# Patient Record
Sex: Female | Born: 1955
Health system: Southern US, Community
[De-identification: ages and names within clinical notes are randomized; demographics above are authoritative.]

## PROBLEM LIST (undated history)

## (undated) DIAGNOSIS — M545 Low back pain, unspecified: Secondary | ICD-10-CM

## (undated) DIAGNOSIS — M797 Fibromyalgia: Secondary | ICD-10-CM

## (undated) DIAGNOSIS — I10 Essential (primary) hypertension: Secondary | ICD-10-CM

## (undated) DIAGNOSIS — F32A Depression, unspecified: Secondary | ICD-10-CM

## (undated) DIAGNOSIS — E039 Hypothyroidism, unspecified: Secondary | ICD-10-CM

## (undated) DIAGNOSIS — Z87442 Personal history of urinary calculi: Secondary | ICD-10-CM

## (undated) DIAGNOSIS — R51 Headache: Secondary | ICD-10-CM

## (undated) DIAGNOSIS — R87619 Unspecified abnormal cytological findings in specimens from cervix uteri: Secondary | ICD-10-CM

## (undated) DIAGNOSIS — G8929 Other chronic pain: Secondary | ICD-10-CM

## (undated) DIAGNOSIS — G4700 Insomnia, unspecified: Secondary | ICD-10-CM

## (undated) DIAGNOSIS — K219 Gastro-esophageal reflux disease without esophagitis: Secondary | ICD-10-CM

## (undated) DIAGNOSIS — F329 Major depressive disorder, single episode, unspecified: Secondary | ICD-10-CM

## (undated) DIAGNOSIS — F419 Anxiety disorder, unspecified: Secondary | ICD-10-CM

## (undated) DIAGNOSIS — M810 Age-related osteoporosis without current pathological fracture: Secondary | ICD-10-CM

## (undated) DIAGNOSIS — E7800 Pure hypercholesterolemia, unspecified: Secondary | ICD-10-CM

## (undated) DIAGNOSIS — J45909 Unspecified asthma, uncomplicated: Secondary | ICD-10-CM

## (undated) DIAGNOSIS — C539 Malignant neoplasm of cervix uteri, unspecified: Secondary | ICD-10-CM

## (undated) DIAGNOSIS — M5136 Other intervertebral disc degeneration, lumbar region: Secondary | ICD-10-CM

## (undated) DIAGNOSIS — L900 Lichen sclerosus et atrophicus: Secondary | ICD-10-CM

## (undated) DIAGNOSIS — Z950 Presence of cardiac pacemaker: Secondary | ICD-10-CM

## (undated) DIAGNOSIS — E079 Disorder of thyroid, unspecified: Secondary | ICD-10-CM

## (undated) DIAGNOSIS — R7303 Prediabetes: Secondary | ICD-10-CM

## (undated) DIAGNOSIS — I509 Heart failure, unspecified: Secondary | ICD-10-CM

## (undated) DIAGNOSIS — I2510 Atherosclerotic heart disease of native coronary artery without angina pectoris: Secondary | ICD-10-CM

## (undated) DIAGNOSIS — N879 Dysplasia of cervix uteri, unspecified: Secondary | ICD-10-CM

## (undated) DIAGNOSIS — E559 Vitamin D deficiency, unspecified: Secondary | ICD-10-CM

## (undated) DIAGNOSIS — Z8719 Personal history of other diseases of the digestive system: Secondary | ICD-10-CM

## (undated) DIAGNOSIS — I4891 Unspecified atrial fibrillation: Secondary | ICD-10-CM

## (undated) DIAGNOSIS — D649 Anemia, unspecified: Secondary | ICD-10-CM

## (undated) DIAGNOSIS — M51369 Other intervertebral disc degeneration, lumbar region without mention of lumbar back pain or lower extremity pain: Secondary | ICD-10-CM

## (undated) HISTORY — PX: HERNIA REPAIR: SHX51

## (undated) HISTORY — DX: Anxiety disorder, unspecified: F41.9

## (undated) HISTORY — DX: Age-related osteoporosis without current pathological fracture: M81.0

## (undated) HISTORY — DX: Lichen sclerosus et atrophicus: L90.0

## (undated) HISTORY — DX: Atherosclerotic heart disease of native coronary artery without angina pectoris: I25.10

## (undated) HISTORY — DX: Dysplasia of cervix uteri, unspecified: N87.9

## (undated) HISTORY — PX: TUBAL LIGATION: SHX77

## (undated) HISTORY — DX: Insomnia, unspecified: G47.00

## (undated) HISTORY — PX: GYNECOLOGIC CRYOSURGERY: SHX857

## (undated) HISTORY — DX: Unspecified abnormal cytological findings in specimens from cervix uteri: R87.619

## (undated) HISTORY — DX: Other intervertebral disc degeneration, lumbar region: M51.36

## (undated) HISTORY — DX: Vitamin D deficiency, unspecified: E55.9

## (undated) HISTORY — PX: KNEE ARTHROSCOPY: SUR90

## (undated) HISTORY — DX: Other intervertebral disc degeneration, lumbar region without mention of lumbar back pain or lower extremity pain: M51.369

## (undated) HISTORY — PX: CARDIAC CATHETERIZATION: SHX172

## (undated) HISTORY — DX: Major depressive disorder, single episode, unspecified: F32.9

## (undated) HISTORY — DX: Depression, unspecified: F32.A

## (undated) NOTE — *Deleted (*Deleted)
PCP: Wanda Plump, MD  HF Cardiology: Dr. Shirlee Latch  63 y.o. with CAD s/p PCI in 2010, chronic combined systolic and diastolic HF (EF 16-10% in 2018), atrial fibrillation on chronic anticoagulation w/ Xarelto, HTN, and hypothyroidism was diagnosed w/ COVID-19 in Sep 2020 and treated at [facility]. COVID infection c/b PNA. She was discharged and readmitted back to [facility] 10/9-10/13/20 for gastroenteritis, felt to be a sequela of COVID-19. She was managed w/ supportive care and discharged home. Had f/u with PCP 10/22 and endorsed worsening LLQ pain leading to abdominal CT which showed acute diverticulitis. She was started on outpatient antibiotics, cipro + flagyl but symptoms failed to improve, prompting her to seek emergency medical care at [facility].  Initial EKG in the ED showed afib w/ RVR for which general cardiology was consulted. She was started on IV dilt for rate control but later required treatment w/ IV amiodarone due to persistent rapid ventricular response. Hospital course c/b volume overload, requiring IV diuretics. 2D echo was obtained and showed worsening LVEF, now 25-30% w/ moderate MR. She underwent subsequent R/LHC in 11/20 which demonstrated widely patent coronaries. RHC showed elevated mean RA pressure at 22, PWP 43, LVEDP 45 and low CO. CI by Fick 1.6. She underwent TEE-guided DCCV, TEE showed EF 15% with moderate RV dysfunction and moderate MR.  She went back into rapid atrial fibrillation.  She failed additional cardioversions.  Despite amiodarone gtt, HR remained in the 140s-150s.  Finally, she underwent AV nodal ablation with BiV pacing due to inability to control atrial fibrillation. While in the hospital, she was on milrinone gtt for a period of time, but we were able to taper it off. She was discharged home after medication optimization.   Echo in 2/21 showed EF up to 40-45%, mild diffuse hypokinesis, RV low normal function.   Today she returns for HF follow up. Last visit  farxiga was started. Since that appointment, fluid index was still suggestive of fluid overload so last week torsemide was increased to 40 mg daily. Overall feeling fine. Denies SOB/PND/Orthopnea. Appetite ok. No fever or chills. Weight at home  pounds. Taking all medications   Labs (11/20): digoxin level 1.4, creatinine 1.81, K 4.8, hgb 13.9 Labs (1/21): LDL 91, K 3.7, creatinine 9.60 Labs (2/21): K 5.1, creatinine 1.37, TSH normal, digoxin 0.4 Labs (5/21): LDL 68, digoxin 0.3 Labs (4/54): K 4.4, creatinine 1.21  Medtronic device interrogation: PMH: 1. Hypothyroidism 2. Single kidney s/p nephrectomy 3. Atrial fibrillation: Initially diagnosed in 2018.  She was on sotalol at one point to maintain NSR.  - Uncontrollable atrial fibrillation during 11/20 admission, patient eventually had AV nodal ablation with CRT-P implantation.  4. H/o achalasia 5. GERD 6. Fibromyalgia 7. H/o diverticulitis in 10/20 8. COVID-19 in 9/20.  9. H/o cervical cancer: cryotherapy.  10. Degenerative disc disease.  11. Chronic systolic CHF: Nonischemic cardiomyopathy, possibly tachycardia-mediated in setting of uncontrolled atrial fibrillation. Medtronic CRT-P device.  - Echo (2018): EF 45-50%.  - Echo (11/20): EF 25-30%, mildly decreased RV systolic function, moderate MR.  - TEE (11/20): EF 15%, moderately decreased RV systolic function, moderate MR.  - Cardiac MRI (11/20): EF 34%, RV EF 30%, no LGE (done after AV nodal ablation and BiV pacing).  - RHC (11/20): mean RA 22, mean PCWP 43, LVEDP 45, CI 1.6 (Fick) and 2.7 (thermodilution).  - Echo (2/21): EF 40-45%, mild diffuse hypokinesis, low normal RV systolic function.  12. CAD: PCI in 2010.  - LHC (11/20): No  significant coronary disease.   Social History   Socioeconomic History  . Marital status: Married    Spouse name: Not on file  . Number of children: 3  . Years of education: Not on file  . Highest education level: Not on file  Occupational  History  . 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 Smoking status: Former Smoker    Packs/day: 2.00    Years: 28.00    Pack years: 56.00    Types: Cigarettes    Quit date: 2000    Years since quitting: 21.8  . Smokeless tobacco: Never Used  Vaping Use  . Vaping Use: Never used  Substance and Sexual Activity  . Alcohol use: Yes    Alcohol/week: 5.0 standard drinks    Types: 5 Cans of beer per week    Comment: 07/27/2017 "3-4 drinks//month"  . Drug use: No  . Sexual activity: Not Currently    Comment: 1ST INTERCOURSE- 16, PARTNERS - 2  Other Topics Concern  . Not on file  Social History Narrative   Original from [place]   3 children, 2 alive   Household --pt and husband    Social Determinants of Corporate investment banker Strain:   . Difficulty of Paying Living Expenses: Not on file  Food Insecurity:   . Worried About Programme researcher, broadcasting/film/video in the Last Year: Not on file  . Ran Out of Food in the Last Year: Not on file  Transportation Needs:   . Lack of Transportation (Medical): Not on file  . Lack of Transportation (Non-Medical): Not on file  Physical Activity:   . Days of Exercise per Week: Not on file  . Minutes of Exercise per Session: Not on file  Stress:   . Feeling of Stress : Not on file  Social Connections:   . Frequency of Communication with Friends and Family: Not on file  . Frequency of Social Gatherings with Friends and Family: Not on file  . Attends Religious Services: Not on file  . Active Member of Clubs or Organizations: Not on file  . Attends Banker Meetings: Not on file  . Marital Status: Not on file  Intimate Partner Violence:   . Fear of Current or Ex-Partner: Not on file  . Emotionally Abused: Not on file  . Physically Abused: Not on file  . Sexually Abused: Not on file   Family History  Problem Relation Age of Onset  . Breast cancer Other        aunt   . CAD Brother   . Lung cancer Mother        F and M  . Diabetes  Father        F and mother   . CAD Father   . Lung cancer Father   . Stroke Sister   . Colon cancer Neg Hx    ROS: All systems reviewed and negative except as per HPI.   Current Outpatient Medications  Medication Sig Dispense Refill  . alendronate (FOSAMAX) 70 MG tablet Take 1 tablet (70 mg total) by mouth every 7 (seven) days. Take with a full glass of water on an empty stomach. 12 tablet 3  . allopurinol (ZYLOPRIM) 100 MG tablet TAKE ONE TABLET BY MOUTH DAILY 90 tablet 0  . atorvastatin (LIPITOR) 40 MG tablet TAKE 1 AND 1/2 TABLET BY MOUTH AT BEDTIME 135 tablet 0  . carvedilol (COREG) 6.25 MG tablet TAKE ONE TABLET BY MOUTH TWICE  A DAY WITH MEALS 180 tablet 3  . Cholecalciferol (VITAMIN D3) 5000 UNITS CAPS Take 5,000 Units by mouth daily.     . clobetasol cream (TEMOVATE) 0.05 %     . cyclobenzaprine (FLEXERIL) 10 MG tablet Take 1 tablet (10 mg total) by mouth 2 (two) times daily as needed for muscle spasms. 60 tablet 1  . dapagliflozin propanediol (FARXIGA) 10 MG TABS tablet Take 1 tablet (10 mg total) by mouth daily before breakfast. 30 tablet 3  . DULoxetine (CYMBALTA) 60 MG capsule TAKE ONE CAPSULE BY MOUTH DAILY 90 capsule 0  . ferrous fumarate (HEMOCYTE - 106 MG FE) 325 (106 Fe) MG TABS tablet Take 1 tablet (106 mg of iron total) by mouth 2 (two) times daily. Do not take at the same time as omeprazole (Prilosec) 60 tablet 6  . ketoconazole (NIZORAL) 2 % cream Apply 1 application topically daily. 60 g 0  . levothyroxine (SYNTHROID) 125 MCG tablet Take 1 tablet (125 mcg total) by mouth daily before breakfast. 90 tablet 1  . metFORMIN (GLUCOPHAGE) 500 MG tablet Take 1 tablet (500 mg total) by mouth 2 (two) times daily with a meal. 180 tablet 1  . omeprazole (PRILOSEC) 20 MG capsule Take 1 capsule (20 mg total) by mouth 2 (two) times daily before a meal. 180 capsule 3  . rivaroxaban (XARELTO) 20 MG TABS tablet Take 1 tablet (20 mg total) by mouth daily with supper. 90 tablet 3  .  sacubitril-valsartan (ENTRESTO) 97-103 MG Take 1 tablet by mouth 2 (two) times daily. 60 tablet 3  . spironolactone (ALDACTONE) 25 MG tablet Take 1 tablet (25 mg total) by mouth daily. 30 tablet 11  . torsemide (DEMADEX) 20 MG tablet Take 2 tablets (40 mg total) by mouth daily. 180 tablet 3  . zolpidem (AMBIEN) 5 MG tablet TAKE ONE TABLET BY MOUTH AT BEDTIME AS NEEDED 90 tablet 0   No current facility-administered medications for this visit.   There were no vitals taken for this visit.  Wt Readings from Last 3 Encounters:  06/18/20 99.4 kg (219 lb 2 oz)  06/04/20 103.1 kg (227 lb 3.2 oz)  04/10/20 101.5 kg (223 lb 12.8 oz)   General:  Well appearing. No resp difficulty HEENT: normal Neck: supple. no JVD. Carotids 2+ bilat; no bruits. No lymphadenopathy or thryomegaly appreciated. Cor: PMI nondisplaced. Regular rate & rhythm. No rubs, gallops or murmurs. Lungs: clear Abdomen: soft, nontender, nondistended. No hepatosplenomegaly. No bruits or masses. Good bowel sounds. Extremities: no cyanosis, clubbing, rash, edema Neuro: alert & orientedx3, cranial nerves grossly intact. moves all 4 extremities w/o difficulty. Affect pleasant   Assessment/Plan: 1. Chronic systolic CHF: Nonischemic cardiomyopathy. Echo in 2018 with EF 45-50%. Echo in 11/20 with EF 25-30%, diffuse hypokinesis, mildly decreased RV systolic function, moderate MR. LHC/RHC in 11/20 with no significant coronary disease, markedly elevated filling pressures and low cardiac output (CI 1.6). 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 Cardiac MRI 11/20 showed LVEF 34%, RVEF 30%, no LGE => perhaps pointing to tachy-mediated CMP as more likely etiology.  Initially required inotropic support w/ milrinone but able to titrate off.  Now s/p AV nodal ablation with MDT CRT-P.  Echo in 2/21 showed EF up to 40-45%. NYHA II. -  Volume status  - Continue torsemide 40 mg daily.  - Continue farxiga 10 mg daily.  -Continue Entresto to 97/103 bid. - Continue  spironolactone to 25 daily.  -  Continue Coreg 6.25 mg bid.  -EF 40-45% - stop digoxin.   2. Atrial fibrillation: Persistent atrial fibrillation, it appears, at least since 9/20 when she was admitted with COVID-19. Prior, she had paroxysmal atrial fibrillation and was maintained on sotalol. She is now off sotalol.  Unable to cardiovert or rate control atrial fibrillation. Therefore, she had AV nodal ablation with BiV pacing.  - Now off amiodarone.  Rate controlled. No bleeding issues.  - Continue Xarelto 20 mg daily.  3. Mitral regurgitation: Functional MR, moderate on TEE.  Rate was controlled on cardiac MRI, MR was less impressive.  Minimal MR on echo in 2/21.  4. CKD stage 3: Check BMET and in 7 days.  5. Suspect OSA: Arrange sleep study.     Check bmet.   Followup    Amy Clegg NP-C  06/28/2020

---

## 1989-08-15 HISTORY — PX: NEPHRECTOMY: SHX65

## 2005-07-28 ENCOUNTER — Ambulatory Visit: Payer: Self-pay | Admitting: Pulmonary Disease

## 2005-07-29 ENCOUNTER — Ambulatory Visit (HOSPITAL_BASED_OUTPATIENT_CLINIC_OR_DEPARTMENT_OTHER): Admission: RE | Admit: 2005-07-29 | Discharge: 2005-07-29 | Payer: Self-pay | Admitting: Pulmonary Disease

## 2005-08-11 ENCOUNTER — Ambulatory Visit: Payer: Self-pay | Admitting: Pulmonary Disease

## 2005-08-12 ENCOUNTER — Ambulatory Visit: Payer: Self-pay | Admitting: Pulmonary Disease

## 2005-08-18 ENCOUNTER — Ambulatory Visit: Payer: Self-pay | Admitting: Internal Medicine

## 2007-08-16 HISTORY — PX: ESOPHAGOMYOTOMY: SHX5240

## 2007-11-29 ENCOUNTER — Encounter: Admission: RE | Admit: 2007-11-29 | Discharge: 2007-11-29 | Payer: Self-pay | Admitting: Gastroenterology

## 2007-12-10 ENCOUNTER — Ambulatory Visit (HOSPITAL_COMMUNITY): Admission: RE | Admit: 2007-12-10 | Discharge: 2007-12-10 | Payer: Self-pay | Admitting: Gastroenterology

## 2008-02-20 ENCOUNTER — Inpatient Hospital Stay (HOSPITAL_COMMUNITY): Admission: AD | Admit: 2008-02-20 | Discharge: 2008-02-22 | Payer: Self-pay | Admitting: Surgery

## 2008-09-09 ENCOUNTER — Encounter: Admission: RE | Admit: 2008-09-09 | Discharge: 2008-09-09 | Payer: Self-pay | Admitting: Rheumatology

## 2008-09-11 ENCOUNTER — Ambulatory Visit: Payer: Self-pay | Admitting: Internal Medicine

## 2008-09-22 ENCOUNTER — Encounter: Admission: RE | Admit: 2008-09-22 | Discharge: 2008-09-22 | Payer: Self-pay | Admitting: Rheumatology

## 2008-09-30 ENCOUNTER — Ambulatory Visit (HOSPITAL_COMMUNITY): Admission: RE | Admit: 2008-09-30 | Discharge: 2008-09-30 | Payer: Self-pay | Admitting: Rheumatology

## 2008-09-30 LAB — CBC WITH DIFFERENTIAL/PLATELET
Eosinophils Absolute: 0 10*3/uL (ref 0.0–0.5)
LYMPH%: 9.3 % — ABNORMAL LOW (ref 14.0–48.0)
MCHC: 33.6 g/dL (ref 32.0–36.0)
MCV: 87.3 fL (ref 81.0–101.0)
MONO%: 2.4 % (ref 0.0–13.0)
NEUT#: 9.9 10*3/uL — ABNORMAL HIGH (ref 1.5–6.5)
Platelets: 299 10*3/uL (ref 145–400)
RBC: 4.18 10*6/uL (ref 3.70–5.32)

## 2008-10-02 LAB — COMPREHENSIVE METABOLIC PANEL
Alkaline Phosphatase: 71 U/L (ref 39–117)
Creatinine, Ser: 0.56 mg/dL (ref 0.40–1.20)
Glucose, Bld: 97 mg/dL (ref 70–99)
Sodium: 140 mEq/L (ref 135–145)
Total Bilirubin: 0.3 mg/dL (ref 0.3–1.2)
Total Protein: 7.6 g/dL (ref 6.0–8.3)

## 2008-10-02 LAB — IMMUNOFIXATION ELECTROPHORESIS
IgA: 477 mg/dL — ABNORMAL HIGH (ref 68–378)
IgG (Immunoglobin G), Serum: 1040 mg/dL (ref 694–1618)
IgM, Serum: 55 mg/dL — ABNORMAL LOW (ref 60–263)
Total Protein, Serum Electrophoresis: 7.6 g/dL (ref 6.0–8.3)

## 2010-12-28 NOTE — Discharge Summary (Signed)
Melinda, Mckinney NO.:  [id number]   MEDICAL RECORD NO.:  [id number]          PATIENT TYPE:  INP   LOCATION:  1524                         FACILITY:  [facility]   PHYSICIAN:  Ardeth Sportsman, MD     DATE OF BIRTH:  Oct 15, 1955   DATE OF ADMISSION:  02/18/2008  DATE OF DISCHARGE:                               DISCHARGE SUMMARY   PRIMARY CARE PHYSICIAN:  Lerry Liner, M.D.   GASTROENTEROLOGIST:  Jordan Hawks. Elnoria Howard, MD   SURGEON:  Ardeth Sportsman, MD   FINAL DISCHARGE DIAGNOSIS:  Achalasia.   OTHER DISCHARGE DIAGNOSES INCLUDE:  1. Obesity.  2. Chronic constipation.  3. Cough.  4. Hyperlipidemia.  5. Hypothyroidism.  6. Osteoporosis.  7. Asthma.  8. Cervical dysplasia.  9. Migraine headaches.  10.Pyelonephritis and kidney infections in the past.  11.Status post nephrectomy.  12.Status post incisional hernia repairs.  13.History of Bell palsy and vision changes, resolved.  14.History of colon polyps, status post colonoscopy 2 years ago.  15.Diabetes.  16.Status post C-section x3.   PROCEDURE:  Laparoscopic Heller esophagocardiomyotomy with Toupet  partial posterior (240-degrees) fundoplication on February 18, 2008.   HOSPITAL COURSE:  Melinda Mckinney is a pleasant 55 year old obese female who  had worsening dysphagia with workup by radiographic and endoscopic  findings consistent with achalasia.  Surgical consultation was made and  I recommended myotomy and fundoplication.  The risks, benefits, and  alternatives were discussed, she agree to proceed.  She underwent this  on February 18, 2008.  Postoperatively she had significant dysphagia.  She  had very little contrast going across her stomach.  She was transitioned  back down to sips and placed on IV steroids.  The following day her  dysphagia markedly decreased.   On postop day #3 repeat swallow studies showed much improvement in  passage of contrast and decreased edema.  Her dysphagia was markedly  improved.  She was  advanced to a pureed diet.   By postoperative day #4 she was walking the hallways with the pain  controlled on oral medications.  No fever, chills, or sweats.  She had  had flatus and a bowel movement. The patient improved and was discharged  home with the following instructions:  1. She is to stick to a pureed diet for at least 2 weeks.  We had      discussed this at length preoperatively and postoperatively with an      interpreter and has a handout that discusses the type of a diet      advancement in detail.  She should use warm liquids for severe      dysphagia, and transition down to liquids only if her dysphagia      persists.  2. She should crush her home medications until she is tolerating a      soft diet.   DISCHARGE MEDICATIONS:  She should resume her home medications to  include  1. Amitriptyline 25 mg p.o. nightly.  2. Atenolol 25 mg p.o. daily.  3. Tramadol 50 mg 1-2 p.o. q.4 h. p.r.n. pain.  4. Levothyroxine  100 mcg daily.  5. Metformin 500 mg p.o. daily.  6. Vitamin B complex b.i.d.  7. Vitamin B 6 daily.  8. Glucosamine daily.  9. Calcium daily.  10.Advair p.r.n.  11.She should take tramadol, ibuprofen, and Tylenol as needed for pain      control.  12.She can use ice and heat as needed for pain control.  13.She should take Reglan 10 mg p.o. q.6 h. p.r.n. or Phenergan 25 mg      suppositories PR q.6 h. p.r.n. nausea or vomiting.  She has refills      for those if she needs that later on.   She should return to clinic to see me in about 2 weeks to make sure that  she is continuing to improve.      Ardeth Sportsman, MD  Electronically Signed     SCG/MEDQ  D:  02/22/2008  T:  02/22/2008  Job:  [id number]   cc:   Lerry Liner, MD   Jordan Hawks. Elnoria Howard, MD  Fax: [phone]

## 2010-12-28 NOTE — Op Note (Signed)
NAMEDARRIEL, SINQUEFIELD                ACCOUNT NO.:  [id number]   MEDICAL RECORD NO.:  [id number]          PATIENT TYPE:  AMB   LOCATION:  DAY                          FACILITY:  [facility]   PHYSICIAN:  Ardeth Sportsman, MD     DATE OF BIRTH:  03-21-1956   DATE OF PROCEDURE:  DATE OF DISCHARGE:                               OPERATIVE REPORT   PRIMARY CARE PHYSICIAN:  Lerry Liner.   GASTROENTEROLOGIST:  Jordan Hawks. Elnoria Howard, MD.   PREOPERATIVE DIAGNOSIS:  Achalasia.   POSTOPERATIVE DIAGNOSES:  1. Achalasia.  2. Fatty steatohepatosis.   SURGEON:  Ardeth Sportsman, MD   ASSISTANT:  Harden Mo, MD   PROCEDURE PERFORMED:  1. Laparoscopic Heller esophagocardiomyotomy.  2. Toupet (partial posterior 240-degree) fundoplication.   ANESTHESIA:  1. General anesthesia.  2. Local anesthetic in a field block around all port sites.   SPECIMENS:  None.   DRAINS:  None.   ESTIMATED BLOOD LOSS:  50 mL.   COMPLICATIONS:  No major complications.   INDICATIONS:  Ms. Nipper is a 55 year old Hispanic female who has had  progressive dysphagia to solids and then liquids.  She had evaluation 2  years ago which showed concerns of an esophageal stricture.  She was  dilated.  Unfortunately, she had recurrent dysphagia.  Dr. Elnoria Howard worked  her up and based on endoscopic, manometric and then radiographic  findings were strongly suggestive of achalasia.  Surgical consultation  was requested.   The anatomy and physiology of the digestive tract was explained and the  pathophysiology of achalasia was discussed.  Its natural history was  explained as well.  Options were discussed and recommendations made for  diagnostic laparoscopy with a myotomy of the hypertrophic esophageal and  stomach muscles.  Recommendations were made for fundoplication for  antireflux mechanism as well and a partial one to avoid recurrent  dysphagia.  Risks, benefits, alternatives discussed, questions answered,  and she agreed to  see.   OPERATIVE FINDINGS:  She had a normal crus with no evidence of any  paraesophageal hernia and no major upper abdominal adhesions.  She did  have fatty steatohepatitis.   DESCRIPTION OF PROCEDURE:  Informed consent was confirmed.  The patient  received IV clindamycin and gentamicin given her PENICILLIN allergy.  She had sequential compression devices active during the entire case.  She underwent general anesthesia without any difficulty.  She had a  Foley catheter sterilely placed.  She was positioned supine with both  arms in a split leg.  A 5-mm port was placed in the left upper quadrant  with the patient in steep reverse Trendelenburg and the left side up.  Camera inspection revealed no intra-abdominal injury.  Under direct  visualization a 5-mm port was placed in the right upper quadrant.  Another one was placed in the subxiphoid region going just to the left  of midline, the subxiphoid region removed and the Nathanson omega-shaped  liver retractor was used to lift the left lateral sector of the liver  anteriorly.  Another 10-mm port was placed in the left subcostal region.   A  window was made in the hepatogastric omentum and this was followed up  towards the right crus.  A window was made between the right crus and  the esophagus.  This helped define the medial border of the right crus.  This was followed posteriorly until the base of the crura was noted.  I  was able to find the left crus with this.  I was able to define the  lateral border of the left crus at its base and the retroesophageal  window and follow that a little more cephalad.   Further dissection was done over the left crus anteriorly over to the  anterior medial part of the left crus.  The short gastric vessels were  taken off the greater curvature of the stomach about a third of the way  down and followed up along the greater curve over the cardia.  The  stomach was rolled away from the greater omentum and  the spleen to avoid  any injury.  This helped meet up with the left crus.  A window was made  between the medial border of the left crus and the esophagus.  Further,  this helped identify both crura as well.   A type 2 mediastinal dissection was done to help get better intra-  abdominal esophageal length.  The esophagus did not seem particularly  dilated going more proximally in.  The anterior epiphrenic appendage was  freed off to help better define the esophagogastric junction.  The  anterior vagus nerve was mobilized from the lesser curvature of the  stomach proximally up to the mid esophagus to help reflect it out of the  way.   A myotomy was performed starting at the base of the esophagogastric  junction using a cold scissors initially to get through the longitudinal  fibers and through the circular fibers.  Careful blunt dissection was  done to get underneath the hypertrophic circular fibers and the myotomy  was performed, using primarily Harmonic to help ligate through the  hypertrophic muscles.  This myotomy was carried 7 cm more proximally on  the esophagus to good result.  Further dissection was done more  proximally on the stomach to help free the oblique and other muscle  layers off until we got a good myotomy.  There was a small amount of  bleeders with some crossing vessels, but these were controlled with  pressure as well as focused cautery.  The myotomy went over 3 cm onto  the stomach from the esophagogastric junction.  Total length was over 10  cm.   With a good myotomy we went ahead and brought the cardia of the stomach  posterior to the esophagus over the right side to help set up the right  side of the wrap, a shoeshine maneuver as well make sure the wrap was  not twisted or turned.  A posterior gastropexy was done by taking a bite  of the posterior wall of the right side of the wrap to the crura and  using interrupted stitches x3 using #1 Ethibond stitch.  This  helped to  have a posterior gastropexy.  The wrap was set up on the Toupet  fundoplication by taking a bite through the apex of the left crus as  well as the most superior end of the left side of the wrap at the  greater curvature and also taking a bite at the myotomy at the most  superior edge of intraabdominal esophagus as well.  A similar bite  was  made in a mirror-image fashion on the right.  Two more bites were taken  down more distally for a total of 6 interrupted stitches for a length of  4.5 cm for the classic posterior Toupet fundoplication.  The vagus was  not involved.  Hemostasis was good.  There was a small tear on the  posterior liver, which was controlled using cautery.  With hemostasis  good, irrigation was done and inspection was good.  The liver retractor  was removed under direct visualization.  Capnoperitoneum was evacuated  and ports were removed.  The 10-mm left subcostal ridge port site had  rolled back up above the ribs and so I did not more aggressively close  it.  The skin was closed with 4-0 Monocryl stitch.  She was taken to the  recovery room in stable condition.   I am about to explain the operative findings to the patient's family.  I  discussed postoperative care with the patient prior to surgery as well.      Ardeth Sportsman, MD  Electronically Signed     SCG/MEDQ  D:  02/18/2008  T:  02/18/2008  Job:  [id number]

## 2010-12-31 NOTE — Procedures (Signed)
NAMESENECA, GADBOIS NO.:  [id number]   MEDICAL RECORD NO.:  [id number]          PATIENT TYPE:  OUT   LOCATION:  SLEEP CENTER                 FACILITY:  [facility]   PHYSICIAN:  Marcelyn Bruins, M.D. [facility] DATE OF BIRTH:  [birth year]   DATE OF STUDY:  07/29/2005                              NOCTURNAL POLYSOMNOGRAM   REFERRING PHYSICIAN:  Dr. Danice Goltz   INDICATION FOR THE STUDY:  Persistent disorder of initiating and maintaining  sleep.   EPWORTH SCORE:  15   SLEEP ARCHITECTURE:  The patient had a total sleep time of 406 minutes with  decreased slow wave sleep and also REM. Sleep onset latency was fairly rapid  at 3 minutes; however, REM latency was very prolonged at 309 minutes. Sleep  efficiency was 95%.   RESPIRATORY DATA:  The patient was found to have 22 hypopneas and nine  apneas for a respiratory disturbance index of five events per hour. These  events occurred primarily during REM, and moderate to severe snoring was  noted.   OXYGEN DATA:  There was O2 desaturation as low as 87% associated with her  obstructive events.   CARDIAC DATA:  No clinically significant cardiac arrhythmias.   MOVEMENT/PARASOMNIAS:  The patient was found to have 110 leg jerks with  three per hour resulting in arousal or awakening. She was also found to have  increased chin EMG at times that may be indicative of swallowing/GERD.  Clinical correlation is suggested for both of these entities.   IMPRESSION/RECOMMENDATION:  1.  Very mild obstructive sleep apnea/hypopnea syndrome with a respiratory      disturbance index of five events per hour and oxygen desaturation as low      as 87%. However, the patient did have moderate to severe snoring and      very large numbers of nonspecific arousals which may be indicative of      the upper airway resistance syndrome as well.  2.  Large numbers of leg jerks with significant sleep disruption. It is      unclear whether this is due to the  patient's obstructive sleep apnea or      possibly a separate entity.  3.  Increased chin EMG which may be indicative of gastroesophageal reflux      disease. Clinical correlation is suggested.           ______________________________  Marcelyn Bruins, M.D. [facility]  Diplomate, American Board of Sleep  Medicine     KC/MEDQ  D:  08/11/2005 18:19:09  T:  08/12/2005 15:06:10  Job:  [id number]

## 2011-05-12 LAB — BASIC METABOLIC PANEL
BUN: 14
CO2: 27
Chloride: 109
Glucose, Bld: 95
Potassium: 3.9

## 2011-05-12 LAB — CREATININE, SERUM
Creatinine, Ser: 0.57
GFR calc Af Amer: >60
GFR calc non Af Amer: >60

## 2011-05-12 LAB — POTASSIUM: Potassium: 3.8

## 2011-05-12 LAB — HEMOGLOBIN A1C: Mean Plasma Glucose: 119

## 2011-08-18 ENCOUNTER — Emergency Department (HOSPITAL_BASED_OUTPATIENT_CLINIC_OR_DEPARTMENT_OTHER)
Admission: EM | Admit: 2011-08-18 | Discharge: 2011-08-18 | Disposition: A | Discharge status: Home / Self Care | Payer: Federal, State, Local not specified - PPO | Attending: Emergency Medicine | Admitting: Emergency Medicine

## 2011-08-18 ENCOUNTER — Encounter: Payer: Self-pay | Admitting: *Deleted

## 2011-08-18 ENCOUNTER — Emergency Department (INDEPENDENT_AMBULATORY_CARE_PROVIDER_SITE_OTHER): Payer: Federal, State, Local not specified - PPO

## 2011-08-18 DIAGNOSIS — K654 Sclerosing mesenteritis: Secondary | ICD-10-CM

## 2011-08-18 DIAGNOSIS — R935 Abnormal findings on diagnostic imaging of other abdominal regions, including retroperitoneum: Secondary | ICD-10-CM

## 2011-08-18 DIAGNOSIS — R509 Fever, unspecified: Secondary | ICD-10-CM

## 2011-08-18 DIAGNOSIS — I10 Essential (primary) hypertension: Secondary | ICD-10-CM | POA: Insufficient documentation

## 2011-08-18 DIAGNOSIS — R05 Cough: Secondary | ICD-10-CM

## 2011-08-18 DIAGNOSIS — K219 Gastro-esophageal reflux disease without esophagitis: Secondary | ICD-10-CM | POA: Insufficient documentation

## 2011-08-18 DIAGNOSIS — IMO0001 Reserved for inherently not codable concepts without codable children: Secondary | ICD-10-CM | POA: Insufficient documentation

## 2011-08-18 DIAGNOSIS — R109 Unspecified abdominal pain: Secondary | ICD-10-CM | POA: Insufficient documentation

## 2011-08-18 DIAGNOSIS — E079 Disorder of thyroid, unspecified: Secondary | ICD-10-CM | POA: Insufficient documentation

## 2011-08-18 DIAGNOSIS — E7800 Pure hypercholesterolemia, unspecified: Secondary | ICD-10-CM | POA: Insufficient documentation

## 2011-08-18 DIAGNOSIS — F341 Dysthymic disorder: Secondary | ICD-10-CM | POA: Insufficient documentation

## 2011-08-18 DIAGNOSIS — R059 Cough, unspecified: Secondary | ICD-10-CM

## 2011-08-18 DIAGNOSIS — R197 Diarrhea, unspecified: Secondary | ICD-10-CM | POA: Insufficient documentation

## 2011-08-18 HISTORY — DX: Fibromyalgia: M79.7

## 2011-08-18 HISTORY — DX: Gastro-esophageal reflux disease without esophagitis: K21.9

## 2011-08-18 HISTORY — DX: Essential (primary) hypertension: I10

## 2011-08-18 HISTORY — DX: Major depressive disorder, single episode, unspecified: F32.9

## 2011-08-18 HISTORY — DX: Depression, unspecified: F32.A

## 2011-08-18 HISTORY — DX: Pure hypercholesterolemia, unspecified: E78.00

## 2011-08-18 HISTORY — DX: Disorder of thyroid, unspecified: E07.9

## 2011-08-18 LAB — URINALYSIS, ROUTINE W REFLEX MICROSCOPIC
Nitrite: NEGATIVE
Specific Gravity, Urine: 1.016 (ref 1.005–1.030)
Urobilinogen, UA: 0.2 mg/dL (ref 0.0–1.0)

## 2011-08-18 LAB — CBC
MCH: 28.5 pg (ref 26.0–34.0)
MCV: 88.2 fL (ref 78.0–100.0)
Platelets: 263 10*3/uL (ref 150–400)
RBC: 4.07 MIL/uL (ref 3.87–5.11)

## 2011-08-18 LAB — COMPREHENSIVE METABOLIC PANEL
BUN: 15 mg/dL (ref 6–23)
Calcium: 9.6 mg/dL (ref 8.4–10.5)
Creatinine, Ser: 0.6 mg/dL (ref 0.50–1.10)
GFR calc Af Amer: >90 mL/min (ref >90)
Glucose, Bld: 86 mg/dL (ref 70–99)
Sodium: 141 mEq/L (ref 135–145)
Total Protein: 6.9 g/dL (ref 6.0–8.3)

## 2011-08-18 LAB — DIFFERENTIAL
Eosinophils Absolute: 0.3 10*3/uL (ref 0.0–0.7)
Eosinophils Relative: 4 % (ref 0–5)
Lymphs Abs: 2.5 10*3/uL (ref 0.7–4.0)
Monocytes Relative: 8 % (ref 3–12)

## 2011-08-18 LAB — LIPASE, BLOOD: Lipase: 30 U/L (ref 11–59)

## 2011-08-18 MED ORDER — IOHEXOL 300 MG/ML  SOLN
100.0000 mL | Freq: Once | INTRAMUSCULAR | Status: AC | PRN
  Administered 2011-08-18: 100 mL via INTRAVENOUS

## 2011-08-18 MED ORDER — ONDANSETRON HCL 4 MG/2ML IJ SOLN
4.0000 mg | Freq: Once | INTRAMUSCULAR | Status: AC
  Administered 2011-08-18: 4 mg via INTRAVENOUS
  Filled 2011-08-18: qty 2

## 2011-08-18 MED ORDER — MORPHINE SULFATE 4 MG/ML IJ SOLN
4.0000 mg | Freq: Once | INTRAMUSCULAR | Status: AC
  Administered 2011-08-18: 4 mg via INTRAVENOUS
  Filled 2011-08-18: qty 1

## 2011-08-18 MED ORDER — OXYCODONE-ACETAMINOPHEN 5-325 MG PO TABS: 1.0000 | ORAL_TABLET | ORAL | Status: AC | PRN

## 2011-08-18 MED ORDER — SODIUM CHLORIDE 0.9 % IV BOLUS (SEPSIS)
1000.0000 mL | Freq: Once | INTRAVENOUS | Status: AC
  Administered 2011-08-18: 1000 mL via INTRAVENOUS

## 2011-08-18 NOTE — ED Notes (Signed)
Fever, abdominal pain, N-V-D x 1 week. To ED via EMS.

## 2011-08-18 NOTE — ED Provider Notes (Signed)
History     CSN: [id number]  Arrival date & time 08/18/11  1525   First MD Initiated Contact with Patient 08/18/11 1603      Chief Complaint  Patient presents with  . Abdominal Pain    (Consider location/radiation/quality/duration/timing/severity/associated sxs/prior treatment) HPI  55yoF h/o DM, fibromyalgia, axniety, depression since with abdominal pain. Assistance with history and physical exam given by son who is interpreting. She states that she's been sick for approximately one week. This began with rhinorrhea, nasal congestion, cough. She did have diarrhea 4 days ago. 2 days ago she began to experience severe right upper quadrant abdominal pain which radiates to left upper quadrant and right lower quadrant as well. She has x-rays multiple episodes of nonbilious nonbloody emesis. She is unable to tolerate by mouth at this point. +Subj fever + chills today. She has back pain. She denies hematuria, dysuria, frequency, urgency. She does feel like she's not urinating as much as usual. She denies vaginal discharge and is in a monogamous relationship with her husband.  History of abdominal surgeries include 6 hernia repairs, 3 c-sections.    ED Notes, ED Provider Notes from 08/18/11 0000 to 08/18/11 15:24:47       Julieanne Manson, RN 08/18/2011 15:23      Fever, abdominal pain, N-V-D x 1 week. To ED via EMS.    Past Medical History  Diagnosis Date  . Hypertension   . GERD (gastroesophageal reflux disease)   . Diabetes mellitus   . High cholesterol   . Thyroid disease   . Anxiety   . Depression   . Fibromyalgia     History reviewed. No pertinent past surgical history.  No family history on file.  History  Substance Use Topics  . Smoking status: Not on file  . Smokeless tobacco: Not on file  . Alcohol Use: No    OB History    Grav Para Term Preterm Abortions TAB SAB Ect Mult Living                  Review of Systems  All other systems reviewed and are  negative.   except as noted HPI   Allergies  Penicillins  Home Medications   Current Outpatient Rx  Name Route Sig Dispense Refill  . OXYCODONE-ACETAMINOPHEN 5-325 MG PO TABS Oral Take 1 tablet by mouth every 4 (four) hours as needed for pain. 20 tablet 0    BP 117/69  Pulse 71  Temp(Src) 97.5 F (36.4 C) (Oral)  Resp 19  SpO2 99%  Physical Exam  Nursing note and vitals reviewed. Constitutional: She is oriented to person, place, and time. She appears well-developed.       Appears to be in pain  HENT:  Head: Atraumatic.       Mm dry  Eyes: Conjunctivae and EOM are normal. Pupils are equal, round, and reactive to light.  Neck: Normal range of motion. Neck supple.  Cardiovascular: Normal rate, regular rhythm, normal heart sounds and intact distal pulses.   Pulmonary/Chest: Effort normal and breath sounds normal. No respiratory distress. She has no wheezes. She has no rales.  Abdominal: Soft. She exhibits distension. She exhibits no mass. There is tenderness. There is no rebound and no guarding.       Diffuse ttp sparing LLQ +b/l CVAT? Overlying ttp paraspinal thoracic R>L  Musculoskeletal: Normal range of motion.  Neurological: She is alert and oriented to person, place, and time.  Skin: Skin is warm and dry. No  rash noted.  Psychiatric: She has a normal mood and affect.    ED Course  Procedures (including critical care time)  Labs Reviewed  CBC - Abnormal; Notable for the following:    Hemoglobin 11.6 (*)    HCT 35.9 (*)    All other components within normal limits  COMPREHENSIVE METABOLIC PANEL - Abnormal; Notable for the following:    Total Bilirubin 0.1 (*)    All other components within normal limits  DIFFERENTIAL  LIPASE, BLOOD  URINALYSIS, ROUTINE W REFLEX MICROSCOPIC   Dg Chest 2 View  08/18/2011  *RADIOLOGY REPORT*  Clinical Data: Fever and cough.  CHEST - 2 VIEW  Comparison: 09/09/2008  Findings: Heart size is upper limits normal.  The lungs are free  of focal consolidations and pleural effusions.  No edema. Visualized osseous structures have a normal appearance.  IMPRESSION:  1.  Heart size upper limits normal. 2. No focal pulmonary abnormality.  Original Report Authenticated By: Patterson Hammersmith, M.D.   Ct Abdomen Pelvis W Contrast  08/18/2011  *RADIOLOGY REPORT*  Clinical Data: Pain, fever.  CT ABDOMEN AND PELVIS WITH CONTRAST  Technique:  Multidetector CT imaging of the abdomen and pelvis was performed following the standard protocol during bolus administration of intravenous contrast.  Contrast: OMNIPAQUE IOHEXOL 300 MG/ML IV SOLN  Comparison: 09/22/2008  Findings: Lung bases are clear.  No effusions.  Heart is normal size.  Liver, gallbladder, spleen, pancreas, adrenals and left kidney are unremarkable.  No right kidney is visualized as seen on prior study.  There is haziness within the central mesenteric fat, new since prior study.  This could reflect mesenteric panniculitis.  No adenopathy.  No free fluid or free air.  Uterus, adnexa urinary bladder are unremarkable.  Appendix is visualized and is normal. Large and small bowel grossly unremarkable.  IMPRESSION: New diffuse haziness within the central mesenteric fat which may suggest mesenteric panniculitis.  Original Report Authenticated By: Cyndie Chime, M.D.     1. Abdominal pain   2. Mesenteric panniculitis      MDM  Abdominal pain of unclear etiology, may be related to viral illness. Labs, IVF, morphine, zofran. CT A/P. Reassess.  CT AP with mesenteric panniculitis as above. Be related to recent oral illness. Patient appears well here. Her pain is controlled. She is tolerating oral fluid and food without difficulty. She has been advised that the above diagnosis. She's been in pain medication she is to follow with her primary care Dr. Patient given strict precautions for return to the emergency department.       Forbes Cellar, MD 08/18/11 [phone]

## 2011-08-18 NOTE — ED Notes (Signed)
PO challenge passes. Pt states she is ready to go home. MD made aware.

## 2012-07-30 ENCOUNTER — Emergency Department (HOSPITAL_BASED_OUTPATIENT_CLINIC_OR_DEPARTMENT_OTHER)
Admission: EM | Admit: 2012-07-30 | Discharge: 2012-07-30 | Disposition: A | Discharge status: Home / Self Care | Payer: Federal, State, Local not specified - PPO | Attending: Emergency Medicine | Admitting: Emergency Medicine

## 2012-07-30 ENCOUNTER — Emergency Department (HOSPITAL_BASED_OUTPATIENT_CLINIC_OR_DEPARTMENT_OTHER): Payer: Federal, State, Local not specified - PPO

## 2012-07-30 ENCOUNTER — Encounter (HOSPITAL_BASED_OUTPATIENT_CLINIC_OR_DEPARTMENT_OTHER): Payer: Self-pay | Admitting: *Deleted

## 2012-07-30 DIAGNOSIS — K6289 Other specified diseases of anus and rectum: Secondary | ICD-10-CM | POA: Insufficient documentation

## 2012-07-30 DIAGNOSIS — M549 Dorsalgia, unspecified: Secondary | ICD-10-CM | POA: Insufficient documentation

## 2012-07-30 DIAGNOSIS — F329 Major depressive disorder, single episode, unspecified: Secondary | ICD-10-CM | POA: Insufficient documentation

## 2012-07-30 DIAGNOSIS — F3289 Other specified depressive episodes: Secondary | ICD-10-CM | POA: Insufficient documentation

## 2012-07-30 DIAGNOSIS — I10 Essential (primary) hypertension: Secondary | ICD-10-CM | POA: Insufficient documentation

## 2012-07-30 DIAGNOSIS — E079 Disorder of thyroid, unspecified: Secondary | ICD-10-CM | POA: Insufficient documentation

## 2012-07-30 DIAGNOSIS — Z85528 Personal history of other malignant neoplasm of kidney: Secondary | ICD-10-CM | POA: Insufficient documentation

## 2012-07-30 DIAGNOSIS — N390 Urinary tract infection, site not specified: Secondary | ICD-10-CM

## 2012-07-30 DIAGNOSIS — R34 Anuria and oliguria: Secondary | ICD-10-CM | POA: Insufficient documentation

## 2012-07-30 DIAGNOSIS — N949 Unspecified condition associated with female genital organs and menstrual cycle: Secondary | ICD-10-CM | POA: Insufficient documentation

## 2012-07-30 DIAGNOSIS — K219 Gastro-esophageal reflux disease without esophagitis: Secondary | ICD-10-CM | POA: Insufficient documentation

## 2012-07-30 DIAGNOSIS — IMO0001 Reserved for inherently not codable concepts without codable children: Secondary | ICD-10-CM | POA: Insufficient documentation

## 2012-07-30 DIAGNOSIS — Z79899 Other long term (current) drug therapy: Secondary | ICD-10-CM | POA: Insufficient documentation

## 2012-07-30 DIAGNOSIS — E7800 Pure hypercholesterolemia, unspecified: Secondary | ICD-10-CM | POA: Insufficient documentation

## 2012-07-30 DIAGNOSIS — Z8542 Personal history of malignant neoplasm of other parts of uterus: Secondary | ICD-10-CM | POA: Insufficient documentation

## 2012-07-30 DIAGNOSIS — F411 Generalized anxiety disorder: Secondary | ICD-10-CM | POA: Insufficient documentation

## 2012-07-30 DIAGNOSIS — K5900 Constipation, unspecified: Secondary | ICD-10-CM | POA: Insufficient documentation

## 2012-07-30 DIAGNOSIS — R110 Nausea: Secondary | ICD-10-CM | POA: Insufficient documentation

## 2012-07-30 DIAGNOSIS — R319 Hematuria, unspecified: Secondary | ICD-10-CM | POA: Insufficient documentation

## 2012-07-30 DIAGNOSIS — R102 Pelvic and perineal pain: Secondary | ICD-10-CM

## 2012-07-30 DIAGNOSIS — R197 Diarrhea, unspecified: Secondary | ICD-10-CM | POA: Insufficient documentation

## 2012-07-30 DIAGNOSIS — E119 Type 2 diabetes mellitus without complications: Secondary | ICD-10-CM | POA: Insufficient documentation

## 2012-07-30 LAB — COMPREHENSIVE METABOLIC PANEL
AST: 19 U/L (ref 0–37)
BUN: 15 mg/dL (ref 6–23)
CO2: 25 mEq/L (ref 19–32)
Calcium: 10.1 mg/dL (ref 8.4–10.5)
Chloride: 105 mEq/L (ref 96–112)
Creatinine, Ser: 0.8 mg/dL (ref 0.50–1.10)
GFR calc Af Amer: >90 mL/min (ref >90)
GFR calc non Af Amer: 80 mL/min — ABNORMAL LOW (ref >90)
Glucose, Bld: 86 mg/dL (ref 70–99)
Total Bilirubin: 0.3 mg/dL (ref 0.3–1.2)

## 2012-07-30 LAB — CBC WITH DIFFERENTIAL/PLATELET
Basophils Absolute: 0 10*3/uL (ref 0.0–0.1)
Basophils Relative: 0 % (ref 0–1)
Eosinophils Absolute: 0.2 K/uL (ref 0.0–0.7)
Eosinophils Relative: 2 % (ref 0–5)
HCT: 37 % (ref 36.0–46.0)
Hemoglobin: 12.3 g/dL (ref 12.0–15.0)
Lymphocytes Relative: 21 % (ref 12–46)
Lymphs Abs: 2.1 K/uL (ref 0.7–4.0)
MCH: 29.9 pg (ref 26.0–34.0)
MCHC: 33.2 g/dL (ref 30.0–36.0)
MCV: 89.8 fL (ref 78.0–100.0)
Monocytes Absolute: 0.6 10*3/uL (ref 0.1–1.0)
Monocytes Relative: 6 % (ref 3–12)
Neutro Abs: 6.9 K/uL (ref 1.7–7.7)
Neutrophils Relative %: 70 % (ref 43–77)
Platelets: 293 K/uL (ref 150–400)
RBC: 4.12 MIL/uL (ref 3.87–5.11)
RDW: 13.2 % (ref 11.5–15.5)
WBC: 9.8 10*3/uL (ref 4.0–10.5)

## 2012-07-30 LAB — URINE MICROSCOPIC-ADD ON

## 2012-07-30 LAB — URINALYSIS, ROUTINE W REFLEX MICROSCOPIC
Bilirubin Urine: NEGATIVE
Glucose, UA: NEGATIVE mg/dL
Ketones, ur: NEGATIVE mg/dL
Nitrite: NEGATIVE
Protein, ur: 30 mg/dL — AB
Specific Gravity, Urine: 1.024 (ref 1.005–1.030)
Urobilinogen, UA: 1 mg/dL (ref 0.0–1.0)
pH: 5 (ref 5.0–8.0)

## 2012-07-30 LAB — COMPREHENSIVE METABOLIC PANEL WITH GFR
ALT: 17 U/L (ref 0–35)
Albumin: 4.3 g/dL (ref 3.5–5.2)
Alkaline Phosphatase: 74 U/L (ref 39–117)
Potassium: 3.8 meq/L (ref 3.5–5.1)
Sodium: 142 meq/L (ref 135–145)
Total Protein: 7.7 g/dL (ref 6.0–8.3)

## 2012-07-30 LAB — OCCULT BLOOD X 1 CARD TO LAB, STOOL: Fecal Occult Bld: NEGATIVE

## 2012-07-30 LAB — LIPASE, BLOOD: Lipase: 25 U/L (ref 11–59)

## 2012-07-30 MED ORDER — HYDROMORPHONE HCL PF 1 MG/ML IJ SOLN
0.5000 mg | Freq: Once | INTRAMUSCULAR | Status: AC
  Administered 2012-07-30: 0.5 mg via INTRAVENOUS
  Filled 2012-07-30: qty 1

## 2012-07-30 MED ORDER — HYDROCODONE-ACETAMINOPHEN 5-325 MG PO TABS: ORAL_TABLET | ORAL | Status: DC

## 2012-07-30 MED ORDER — IOHEXOL 300 MG/ML  SOLN
100.0000 mL | Freq: Once | INTRAMUSCULAR | Status: AC | PRN
  Administered 2012-07-30: 100 mL via INTRAVENOUS

## 2012-07-30 MED ORDER — CIPROFLOXACIN HCL 500 MG PO TABS: 500.0000 mg | ORAL_TABLET | Freq: Two times a day (BID) | ORAL | Status: DC

## 2012-07-30 MED ORDER — HYDROMORPHONE HCL PF 1 MG/ML IJ SOLN
0.5000 mg | Freq: Once | INTRAMUSCULAR | Status: AC
  Administered 2012-07-30: 0.5 mg via INTRAVENOUS

## 2012-07-30 MED ORDER — HYDROMORPHONE HCL PF 1 MG/ML IJ SOLN
INTRAMUSCULAR | Status: AC
  Filled 2012-07-30: qty 1

## 2012-07-30 MED ORDER — CIPROFLOXACIN IN D5W 400 MG/200ML IV SOLN
400.0000 mg | Freq: Once | INTRAVENOUS | Status: AC
  Administered 2012-07-30: 400 mg via INTRAVENOUS
  Filled 2012-07-30: qty 200

## 2012-07-30 MED ORDER — IOHEXOL 300 MG/ML  SOLN
50.0000 mL | Freq: Once | INTRAMUSCULAR | Status: AC | PRN
  Administered 2012-07-30: 50 mL via ORAL

## 2012-07-30 MED ORDER — PHENAZOPYRIDINE HCL 200 MG PO TABS: 200.0000 mg | ORAL_TABLET | Freq: Three times a day (TID) | ORAL | Status: DC

## 2012-07-30 MED ORDER — SODIUM CHLORIDE 0.9 % IV SOLN
Freq: Once | INTRAVENOUS | Status: AC
  Administered 2012-07-30: 18:00:00 via INTRAVENOUS

## 2012-07-30 MED ORDER — HYDROMORPHONE HCL PF 1 MG/ML IJ SOLN: 1.0000 mg | Freq: Once | INTRAMUSCULAR | Status: DC

## 2012-07-30 NOTE — ED Notes (Addendum)
Pt c/o fever and painful freq urination x 4 days pt also c/o rectal pain

## 2012-07-30 NOTE — ED Notes (Signed)
Pt. Diagnosed with CA in Uterus approx. 6 yrs ago.  Pt. Has a PAP q 6 mths per Pt.

## 2012-07-30 NOTE — ED Notes (Signed)
MD at bedside. 

## 2012-07-30 NOTE — ED Notes (Signed)
Pt. Had tylenol approx. 2 hrs ago/  1 500mg  tablet per Pt.

## 2012-07-30 NOTE — ED Provider Notes (Signed)
History   This chart was scribed for Gavin Pound. Oletta Lamas, MD, by Frederik Pear, ER scribe. The patient was seen in room MH05/MH05 and the patient's care was started at 1654.    CSN: [id number]  Arrival date & time 07/30/12  1626   First MD Initiated Contact with Patient 07/30/12 1654      Chief Complaint  Patient presents with  . Dysuria    (Consider location/radiation/quality/duration/timing/severity/associated sxs/prior treatment) HPI Comments: Melinda Mckinney is a 56 y.o. female who presents to the Emergency Department complaining of gradually worsening, constant, moderate dysuria that is not aggravated by eating and radiates to the lower back and vagina with associated nausea that began 2-3 weeks ago. She also reports hematuria that began earlier today. She also reports that has continually bounced from constipation to diarrhea every couple of days. She denies any vomiting or change in appetite. She states that she is currently in menopause and is married, but with no intercourse. She has a h/o of nephrolithiasis, hypertension, borderline DM, high cholesterol, thyroid disease, uterine cancer, hernias, and had her right kidney removed for cancer. She denies any h/o of similar pain. This doesn't feel like kidney stone pain.  Her daughter states that she has been in [place] for the past year and just returned to the country last week. She is a nonsmoker who does not drink ETOH and is allergic to penicillin.  PCP is DR. Lerry Liner. The history is provided by the patient and a relative. The history is limited by a language barrier. A language interpreter was used.    Past Medical History  Diagnosis Date  . Hypertension   . GERD (gastroesophageal reflux disease)   . Diabetes mellitus   . High cholesterol   . Thyroid disease   . Anxiety   . Depression   . Fibromyalgia   . Cancer     Uterin and kidney    Past Surgical History  Procedure Date  . Other surgical history kidney  removed    History reviewed. No pertinent family history.  History  Substance Use Topics  . Smoking status: Not on file  . Smokeless tobacco: Not on file  . Alcohol Use: No    OB History    Grav Para Term Preterm Abortions TAB SAB Ect Mult Living                  Review of Systems  Constitutional: Negative for fever, chills and appetite change.  Respiratory: Negative for shortness of breath.   Cardiovascular: Negative for chest pain.  Gastrointestinal: Positive for nausea, diarrhea, constipation and rectal pain. Negative for vomiting and abdominal pain.  Genitourinary: Positive for dysuria, hematuria, decreased urine volume, vaginal pain and pelvic pain. Negative for frequency and flank pain.  Musculoskeletal: Positive for back pain.  Skin: Negative for rash.  Neurological: Negative for headaches.  All other systems reviewed and are negative.    Allergies  Penicillins  Home Medications   Current Outpatient Rx  Name  Route  Sig  Dispense  Refill  . ALLOPURINOL 100 MG PO TABS   Oral   Take 100 mg by mouth daily.         Marland Kitchen CANDESARTAN CILEXETIL 8 MG PO TABS   Oral   Take 8 mg by mouth daily.         . DULOXETINE HCL 60 MG PO CPEP   Oral   Take 60 mg by mouth daily.         Marland Kitchen  HYDROCHLOROTHIAZIDE 50 MG PO TABS   Oral   Take 50 mg by mouth daily.         Marland Kitchen LEVOTHYROXINE SODIUM 100 MCG PO TABS   Oral   Take 100 mcg by mouth daily.         Marland Kitchen METFORMIN HCL 500 MG PO TABS   Oral   Take 500 mg by mouth 2 (two) times daily with a meal.         . OMEPRAZOLE 20 MG PO CPDR   Oral   Take 20 mg by mouth daily.         Marland Kitchen CIPROFLOXACIN HCL 500 MG PO TABS   Oral   Take 1 tablet (500 mg total) by mouth 2 (two) times daily.   20 tablet   0   . HYDROCODONE-ACETAMINOPHEN 5-325 MG PO TABS      1-2 tablets po q 6 hours prn moderate to severe pain   20 tablet   0   . PHENAZOPYRIDINE HCL 200 MG PO TABS   Oral   Take 1 tablet (200 mg total) by mouth 3  (three) times daily.   6 tablet   0     BP 138/71  Pulse 73  Temp 98.3 F (36.8 C) (Oral)  Resp 16  Ht 5\' 1"  (1.549 m)  Wt 235 lb (106.595 kg)  BMI 44.40 kg/m2  SpO2 100%  Physical Exam  Nursing note and vitals reviewed. Constitutional: She is oriented to person, place, and time. She appears well-developed and well-nourished. No distress.  HENT:  Head: Normocephalic and atraumatic.  Eyes: EOM are normal. No scleral icterus.  Neck: Normal range of motion. Neck supple.  Cardiovascular: Normal rate and regular rhythm.   Pulmonary/Chest: Effort normal and breath sounds normal. No respiratory distress.  Abdominal: Soft. There is no rebound and no guarding.  Genitourinary:       A chaperone was present. She had no visible hemorrhoids, tenderness, or fecal impaction.  Musculoskeletal: She exhibits no edema.  Neurological: She is alert and oriented to person, place, and time.  Skin: Skin is warm and dry. No rash noted.  Psychiatric: She has a normal mood and affect.    ED Course  Procedures (including critical care time)  DIAGNOSTIC STUDIES: Oxygen Saturation is 100% on room air, nomral by my interpretation.    COORDINATION OF CARE:  17:27- Discussed planned course of treatment with the patient, including blood work and a CT of the abdomen and pelvis, who is agreeable at this time.  17:30- Medication Orders- Hydromorphone (Dilaudid) injection 0.5 mg- Once, 0.9% sodium chloride infusion-Once.  Results for orders placed during the hospital encounter of 07/30/12  URINALYSIS, ROUTINE W REFLEX MICROSCOPIC      Component Value Range   Color, Urine AMBER (*) YELLOW   APPearance CLOUDY (*) CLEAR   Specific Gravity, Urine 1.024  1.005 - 1.030   pH 5.0  5.0 - 8.0   Glucose, UA NEGATIVE  NEGATIVE mg/dL   Hgb urine dipstick LARGE (*) NEGATIVE   Bilirubin Urine NEGATIVE  NEGATIVE   Ketones, ur NEGATIVE  NEGATIVE mg/dL   Protein, ur 30 (*) NEGATIVE mg/dL   Urobilinogen, UA 1.0   0.0 - 1.0 mg/dL   Nitrite NEGATIVE  NEGATIVE   Leukocytes, UA MODERATE (*) NEGATIVE  URINE MICROSCOPIC-ADD ON      Component Value Range   Squamous Epithelial / LPF FEW (*) RARE   WBC, UA 7-10  <3 WBC/hpf   RBC /  HPF TOO NUMEROUS TO COUNT  <3 RBC/hpf   Bacteria, UA RARE  RARE  CBC WITH DIFFERENTIAL      Component Value Range   WBC 9.8  4.0 - 10.5 K/uL   RBC 4.12  3.87 - 5.11 MIL/uL   Hemoglobin 12.3  12.0 - 15.0 g/dL   HCT 96.0  45.4 - 09.8 %   MCV 89.8  78.0 - 100.0 fL   MCH 29.9  26.0 - 34.0 pg   MCHC 33.2  30.0 - 36.0 g/dL   RDW 11.9  14.7 - 82.9 %   Platelets 293  150 - 400 K/uL   Neutrophils Relative 70  43 - 77 %   Neutro Abs 6.9  1.7 - 7.7 K/uL   Lymphocytes Relative 21  12 - 46 %   Lymphs Abs 2.1  0.7 - 4.0 K/uL   Monocytes Relative 6  3 - 12 %   Monocytes Absolute 0.6  0.1 - 1.0 K/uL   Eosinophils Relative 2  0 - 5 %   Eosinophils Absolute 0.2  0.0 - 0.7 K/uL   Basophils Relative 0  0 - 1 %   Basophils Absolute 0.0  0.0 - 0.1 K/uL  COMPREHENSIVE METABOLIC PANEL      Component Value Range   Sodium 142  135 - 145 mEq/L   Potassium 3.8  3.5 - 5.1 mEq/L   Chloride 105  96 - 112 mEq/L   CO2 25  19 - 32 mEq/L   Glucose, Bld 86  70 - 99 mg/dL   BUN 15  6 - 23 mg/dL   Creatinine, Ser 5.62  0.50 - 1.10 mg/dL   Calcium 13.0  8.4 - 86.5 mg/dL   Total Protein 7.7  6.0 - 8.3 g/dL   Albumin 4.3  3.5 - 5.2 g/dL   AST 19  0 - 37 U/L   ALT 17  0 - 35 U/L   Alkaline Phosphatase 74  39 - 117 U/L   Total Bilirubin 0.3  0.3 - 1.2 mg/dL   GFR calc non Af Amer 80 (*) >90 mL/min   GFR calc Af Amer >90  >90 mL/min  LIPASE, BLOOD      Component Value Range   Lipase 25  11 - 59 U/L  OCCULT BLOOD X 1 CARD TO LAB, STOOL      Component Value Range   Fecal Occult Bld NEGATIVE  NEGATIVE    Labs Reviewed  URINALYSIS, ROUTINE W REFLEX MICROSCOPIC - Abnormal; Notable for the following:    Color, Urine AMBER (*)  BIOCHEMICALS MAY BE AFFECTED BY COLOR   APPearance CLOUDY (*)     Hgb  urine dipstick LARGE (*)     Protein, ur 30 (*)     Leukocytes, UA MODERATE (*)     All other components within normal limits  URINE MICROSCOPIC-ADD ON - Abnormal; Notable for the following:    Squamous Epithelial / LPF FEW (*)     All other components within normal limits  COMPREHENSIVE METABOLIC PANEL - Abnormal; Notable for the following:    GFR calc non Af Amer 80 (*)     All other components within normal limits  CBC WITH DIFFERENTIAL  LIPASE, BLOOD  OCCULT BLOOD X 1 CARD TO LAB, STOOL  URINE CULTURE   Ct Abdomen Pelvis W Contrast  07/30/2012  *RADIOLOGY REPORT*  Clinical Data: Dysuria.  Pelvic pain.  Diarrhea.  Hematuria. History of right nephrectomy, uterine cancer, esophageal surgery and hernia  repair.  CT ABDOMEN AND PELVIS WITH CONTRAST  Technique:  Multidetector CT imaging of the abdomen and pelvis was performed following the standard protocol during bolus administration of intravenous contrast.  Contrast: 50mL OMNIPAQUE IOHEXOL 300 MG/ML  SOLN, OMNIPAQUE IOHEXOL 300 MG/ML  SOLN  Comparison: 08/18/2011.  Findings: Mild diffuse low density of the liver relative to the spleen.  Oral contrast in the distal esophagus.  Minimal linear atelectasis or scarring at the lung bases.  Colon interposition laterally on the right.  Tiny splenic calcified granuloma.  Unremarkable pancreas, gallbladder, adrenal glands, left kidney, urinary bladder, uterus and ovaries.  Surgically absent right kidney.  No gastrointestinal abnormalities or enlarged lymph nodes.  Normal appearing appendix.  Minimal lumbar and lower thoracic spine degenerative changes.  IMPRESSION:  1.  No acute abnormality. 2.  Mild hepatic steatosis. 3.  Minimal colonic diverticulosis. 4.  Esophageal dysmotility versus reflux contrast in the distal esophagus.   Original Report Authenticated By: Beckie Salts, M.D.      1. Urinary tract infection   2. Pelvic pain     7:42 PM resutls reviewed with pt and family.  Pt given dose of  IV abx for complicated UTI.  Pt reports pain did improved with initial dose of analgesics, will give additional.  Pt has Dr. Mayford Knife as PCP, I encouraged close follow up this week.    MDM  I personally performed the services described in this documentation, which was scribed in my presence. The recorded information has been reviewed and is accurate.   Hematuria present.  Concern for complicated UTI and possibly early pyelo with h/o single kidney.  In addition, due to diarrhea and constipation, subjective fevers, diverticuliits is also possible.  Pt's UTI may have occurred due to recent diarrhea from diverticulitis. Finally, with presumed h/o kidney cancer, hematuria is concerning, will get CT of abd/pelvis.          Gavin Pound. Oletta Lamas, MD 07/30/12 1610

## 2012-08-01 LAB — URINE CULTURE: Colony Count: 30000

## 2012-08-28 LAB — HM MAMMOGRAPHY

## 2013-08-12 ENCOUNTER — Emergency Department (HOSPITAL_BASED_OUTPATIENT_CLINIC_OR_DEPARTMENT_OTHER): Payer: Federal, State, Local not specified - PPO

## 2013-08-12 ENCOUNTER — Encounter (HOSPITAL_BASED_OUTPATIENT_CLINIC_OR_DEPARTMENT_OTHER): Payer: Self-pay | Admitting: Emergency Medicine

## 2013-08-12 ENCOUNTER — Emergency Department (HOSPITAL_BASED_OUTPATIENT_CLINIC_OR_DEPARTMENT_OTHER)
Admission: EM | Admit: 2013-08-12 | Discharge: 2013-08-13 | Disposition: A | Discharge status: Home / Self Care | Payer: Federal, State, Local not specified - PPO | Attending: Emergency Medicine | Admitting: Emergency Medicine

## 2013-08-12 DIAGNOSIS — Z79899 Other long term (current) drug therapy: Secondary | ICD-10-CM | POA: Insufficient documentation

## 2013-08-12 DIAGNOSIS — F329 Major depressive disorder, single episode, unspecified: Secondary | ICD-10-CM | POA: Insufficient documentation

## 2013-08-12 DIAGNOSIS — IMO0001 Reserved for inherently not codable concepts without codable children: Secondary | ICD-10-CM | POA: Insufficient documentation

## 2013-08-12 DIAGNOSIS — R509 Fever, unspecified: Secondary | ICD-10-CM | POA: Insufficient documentation

## 2013-08-12 DIAGNOSIS — Z87891 Personal history of nicotine dependence: Secondary | ICD-10-CM | POA: Insufficient documentation

## 2013-08-12 DIAGNOSIS — Z792 Long term (current) use of antibiotics: Secondary | ICD-10-CM | POA: Insufficient documentation

## 2013-08-12 DIAGNOSIS — H6691 Otitis media, unspecified, right ear: Secondary | ICD-10-CM

## 2013-08-12 DIAGNOSIS — Z880 Allergy status to penicillin: Secondary | ICD-10-CM | POA: Insufficient documentation

## 2013-08-12 DIAGNOSIS — Z8542 Personal history of malignant neoplasm of other parts of uterus: Secondary | ICD-10-CM | POA: Insufficient documentation

## 2013-08-12 DIAGNOSIS — F3289 Other specified depressive episodes: Secondary | ICD-10-CM | POA: Insufficient documentation

## 2013-08-12 DIAGNOSIS — K219 Gastro-esophageal reflux disease without esophagitis: Secondary | ICD-10-CM | POA: Insufficient documentation

## 2013-08-12 DIAGNOSIS — F411 Generalized anxiety disorder: Secondary | ICD-10-CM | POA: Insufficient documentation

## 2013-08-12 DIAGNOSIS — Z85528 Personal history of other malignant neoplasm of kidney: Secondary | ICD-10-CM | POA: Insufficient documentation

## 2013-08-12 DIAGNOSIS — H6690 Otitis media, unspecified, unspecified ear: Secondary | ICD-10-CM | POA: Insufficient documentation

## 2013-08-12 DIAGNOSIS — E119 Type 2 diabetes mellitus without complications: Secondary | ICD-10-CM | POA: Insufficient documentation

## 2013-08-12 DIAGNOSIS — H9210 Otorrhea, unspecified ear: Secondary | ICD-10-CM | POA: Insufficient documentation

## 2013-08-12 DIAGNOSIS — E079 Disorder of thyroid, unspecified: Secondary | ICD-10-CM | POA: Insufficient documentation

## 2013-08-12 DIAGNOSIS — I10 Essential (primary) hypertension: Secondary | ICD-10-CM | POA: Insufficient documentation

## 2013-08-12 LAB — COMPREHENSIVE METABOLIC PANEL
ALT: 12 U/L (ref 0–35)
AST: 16 U/L (ref 0–37)
Alkaline Phosphatase: 69 U/L (ref 39–117)
CO2: 25 mEq/L (ref 19–32)
GFR calc Af Amer: 80 mL/min — ABNORMAL LOW (ref >90)
GFR calc non Af Amer: 69 mL/min — ABNORMAL LOW (ref >90)
Glucose, Bld: 93 mg/dL (ref 70–99)
Potassium: 3.9 mEq/L (ref 3.5–5.1)
Sodium: 142 mEq/L (ref 135–145)

## 2013-08-12 LAB — CBC WITH DIFFERENTIAL/PLATELET
Basophils Relative: 1 % (ref 0–1)
Lymphocytes Relative: 25 % (ref 12–46)
Lymphs Abs: 2.3 10*3/uL (ref 0.7–4.0)
MCHC: 31.5 g/dL (ref 30.0–36.0)
MCV: 94.2 fL (ref 78.0–100.0)
Neutro Abs: 5.9 10*3/uL (ref 1.7–7.7)
Neutrophils Relative %: 64 % (ref 43–77)
Platelets: 291 10*3/uL (ref 150–400)
RBC: 3.47 MIL/uL — ABNORMAL LOW (ref 3.87–5.11)
RDW: 13.6 % (ref 11.5–15.5)
WBC: 9.1 10*3/uL (ref 4.0–10.5)

## 2013-08-12 MED ORDER — ANTIPYRINE-BENZOCAINE 5.4-1.4 % OT SOLN
3.0000 [drp] | Freq: Once | OTIC | Status: AC
  Administered 2013-08-12: 4 [drp] via OTIC

## 2013-08-12 MED ORDER — HYDROMORPHONE HCL PF 1 MG/ML IJ SOLN
1.0000 mg | Freq: Once | INTRAMUSCULAR | Status: AC
  Administered 2013-08-12: 1 mg via INTRAMUSCULAR
  Filled 2013-08-12: qty 1

## 2013-08-12 MED ORDER — ANTIPYRINE-BENZOCAINE 5.4-1.4 % OT SOLN
OTIC | Status: AC
  Filled 2013-08-12: qty 10

## 2013-08-12 NOTE — ED Provider Notes (Signed)
CSN: [id number]     Arrival date & time 08/12/13  2043 History   First MD Initiated Contact with Patient 08/12/13 2147     This chart was scribed for No att. providers found by Arlan Organ, ED Scribe. This patient was seen in room MHOTF/OTF and the patient's care was started 1:46 AM.   Chief Complaint  Patient presents with  . Otalgia   The history is provided by the patient. No language interpreter was used.    HPI Comments: Melinda Mckinney is a 57 y.o. female with a h/o HTN, GERD, and DM who presents to the Emergency Department complaining of gradual onset, gradually worsening, constant right sided otalgia that started yesterday, but has worsened today. She also reports a mild subjective fever. Pt states she was diagnosed with an ear infection this morning. She says this afternoon she heard a "pop" in her ear, and noted blood and pus coming from her right ear. She states a small amount of blood is still present in her ear canal. She denies any other complaints at this time.  Past Medical History  Diagnosis Date  . Hypertension   . GERD (gastroesophageal reflux disease)   . Diabetes mellitus   . High cholesterol   . Thyroid disease   . Anxiety   . Depression   . Fibromyalgia   . Cancer     Uterin and kidney   Past Surgical History  Procedure Laterality Date  . Other surgical history  kidney removed   No family history on file. History  Substance Use Topics  . Smoking status: Former Games developer  . Smokeless tobacco: Not on file  . Alcohol Use: No   OB History   Grav Para Term Preterm Abortions TAB SAB Ect Mult Living                 Review of Systems  A complete 10 system review of systems was obtained and all systems are negative except as noted in the HPI and PMH.    Allergies  Penicillins  Home Medications   Current Outpatient Rx  Name  Route  Sig  Dispense  Refill  . levofloxacin (LEVAQUIN) 500 MG tablet   Oral   Take 500 mg by mouth daily.         Marland Kitchen  allopurinol (ZYLOPRIM) 100 MG tablet   Oral   Take 100 mg by mouth daily.         . candesartan (ATACAND) 8 MG tablet   Oral   Take 8 mg by mouth daily.         . ciprofloxacin (CIPRO) 500 MG tablet   Oral   Take 1 tablet (500 mg total) by mouth 2 (two) times daily.   20 tablet   0   . DULoxetine (CYMBALTA) 60 MG capsule   Oral   Take 60 mg by mouth daily.         . hydrochlorothiazide (HYDRODIURIL) 50 MG tablet   Oral   Take 50 mg by mouth daily.         Marland Kitchen HYDROcodone-acetaminophen (NORCO/VICODIN) 5-325 MG per tablet      1-2 tablets po q 6 hours prn moderate to severe pain   20 tablet   0   . levothyroxine (SYNTHROID, LEVOTHROID) 100 MCG tablet   Oral   Take 100 mcg by mouth daily.         . metFORMIN (GLUCOPHAGE) 500 MG tablet   Oral   Take 500 mg  by mouth 2 (two) times daily with a meal.         . ofloxacin (FLOXIN) 0.3 % otic solution   Right Ear   Place 5 drops into the right ear 2 (two) times daily.   5 mL   0   . omeprazole (PRILOSEC) 20 MG capsule   Oral   Take 20 mg by mouth daily.         . phenazopyridine (PYRIDIUM) 200 MG tablet   Oral   Take 1 tablet (200 mg total) by mouth 3 (three) times daily.   6 tablet   0   . sulfamethoxazole-trimethoprim (SEPTRA DS) 800-160 MG per tablet   Oral   Take 1 tablet by mouth 2 (two) times daily.   20 tablet   0    Triage Vitals: BP 139/104  Pulse 95  Temp(Src) 98.1 F (36.7 C) (Oral)  Resp 24  Ht 5\' 5"  (1.651 m)  Wt 220 lb (99.791 kg)  BMI 36.61 kg/m2  SpO2 100%  Physical Exam  Nursing note and vitals reviewed. Constitutional: She is oriented to person, place, and time. She appears well-developed and well-nourished. She appears distressed.  Uncomfortable   HENT:  Head: Normocephalic.  Ears:  Mouth/Throat: Oropharynx is clear and moist. No oropharyngeal exudate.  Left TM normal Gross blood in right ear canal No mastoid tenderness No tragus tenderness Bulging TM on right with  central area of deflation   Eyes: Conjunctivae and EOM are normal. Pupils are equal, round, and reactive to light.  Neck: Normal range of motion. Neck supple.  No meningismus  Cardiovascular: Normal rate, regular rhythm and normal heart sounds.   No murmur heard. Pulmonary/Chest: Effort normal. No respiratory distress.  Abdominal: She exhibits no distension.  Musculoskeletal: Normal range of motion.  Neurological: She is alert and oriented to person, place, and time. No cranial nerve deficit. She exhibits normal muscle tone. Coordination normal.  CN 2-12 intact, no ataxia on finger to nose, no nystagmus, 5/5 strength throughout, no pronator drift, Romberg negative, normal gait.   Psychiatric: She has a normal mood and affect.    ED Course  Procedures (including critical care time)  DIAGNOSTIC STUDIES: Oxygen Saturation is 100% on RA, Normal by my interpretation.    COORDINATION OF CARE: 9:54 PM- Will give pain medication. Discussed treatment plan with pt at bedside and pt agreed to plan.     Labs Review Labs Reviewed  CBC WITH DIFFERENTIAL - Abnormal; Notable for the following:    RBC 3.47 (*)    Hemoglobin 10.3 (*)    HCT 32.7 (*)    All other components within normal limits  COMPREHENSIVE METABOLIC PANEL - Abnormal; Notable for the following:    GFR calc non Af Amer 69 (*)    GFR calc Af Amer 80 (*)    All other components within normal limits  SEDIMENTATION RATE - Abnormal; Notable for the following:    Sed Rate 52 (*)    All other components within normal limits   Imaging Review Ct Head Wo Contrast  08/12/2013   CLINICAL DATA:  Otalgia.  EXAM: CT HEAD AND TEMPORAL BONES WITHOUT CONTRAST  TECHNIQUE: Contiguous axial images were obtained from the base of the skull through the vertex without contrast. Multidetector CT imaging of the temporal bones was performed using the standard protocol without intravenous contrast.  COMPARISON:  None available for comparison at time of  study interpretation.  FINDINGS: CT HEAD FINDINGS  The ventricles and sulci  are normal for age. No intraparenchymal hemorrhage, mass effect nor midline shift. Patchy supratentorial white matter hypodensities are within normal range for patient's age and though non-specific suggest sequelae of chronic small vessel ischemic disease. No acute large vascular territory infarcts.  No abnormal extra-axial fluid collections. Basal cisterns are patent. Moderate calcific atherosclerosis of the carotid siphons.  No skull fracture. Soft tissue nearly completely opacifies included left maxillary sinus with sphenoethmoidal and right maxillary mucosal thickening. Mildly atretic right maxillary sinus suggest sequelae of chronic sinusitis. Mild left maxillary sinus mucoperiosteal reaction. Moderate left, mild right temporomandibular osteoarthrosis. The included ocular globes and orbital contents are non-suspicious. The patient is edentulous.  CT TEMPORAL BONES FINDINGS  Right: External auditory is well formed. However there is irregular soft tissue within the fundus of the external auditory canal, with thickened appearance of the tympanic membrane. Irregular soft tissue throughout the middle ear (predominately mesial in at the 10 mm) encasing the ossicles, which appear well formed without erosion. Scutum is sharp. Minimal soft tissue within Prussak's space. The habitus ad antrum appears well aerated, there is however minimal soft tissue density within the mastoid air cells. No destructive bony lesions. Tegmen tympani is intact. Otic capsule intact. No expansion of the internal auditory canal. Minimally aerated right features apex.  Left: External auditory canal is well formed. No tympanic membrane retraction. Middle ear appears well aerated. Scutum is sharp. Ossicles are well formed and located. Mastoid air cells are well aerated. Tegmen tympani intact. Otic capsules unremarkable. No abnormal internal auditory canal expansion.  Aerated with features apex.  IMPRESSION: CT head:  No acute intracranial process.  Acute on chronic paranasal sinusitis.  CT temporal bones: Soft tissue within the fundus of the external auditory canal, and patchy soft tissue throughout the middle air without erosions. This is nonspecific. Consider direct inspection.  Normal CT of the left temporal bones.   Electronically Signed   By: Awilda Metro   On: 08/12/2013 22:52   Ct Temporal Bones W/o Cm  08/12/2013   CLINICAL DATA:  Otalgia.  EXAM: CT HEAD AND TEMPORAL BONES WITHOUT CONTRAST  TECHNIQUE: Axial and coronal plane CT imaging of the petrous temporal bones was performed with thin-collimation image reconstruction. No intravenous contrast was administered. Multiplanar CT image reconstructions were also generated.  COMPARISON:  None available for comparison at time of study interpretation.  FINDINGS: CT HEAD FINDINGS  The ventricles and sulci are normal for age. No intraparenchymal hemorrhage, mass effect nor midline shift. Patchy supratentorial white matter hypodensities are within normal range for patient's age and though non-specific suggest sequelae of chronic small vessel ischemic disease. No acute large vascular territory infarcts.  No abnormal extra-axial fluid collections. Basal cisterns are patent. Moderate calcific atherosclerosis of the carotid siphons.  No skull fracture. Soft tissue nearly completely opacifies included left maxillary sinus with sphenoethmoidal and right maxillary mucosal thickening. Mildly atretic right maxillary sinus suggest sequelae of chronic sinusitis. Mild left maxillary sinus mucoperiosteal reaction. Moderate left, mild right temporomandibular osteoarthrosis. The included ocular globes and orbital contents are non-suspicious. The patient is edentulous.  CT TEMPORAL BONES FINDINGS  Right: External auditory is well formed. However there is irregular soft tissue within the fundus of the external auditory canal, with  thickened appearance of the tympanic membrane. Irregular soft tissue throughout the middle ear (predominately meso- and epitympanum) encasing the ossicles, which appear well formed without erosion. Scutum is sharp. Minimal soft tissue within Prussak's space. The habitus ad antrum appears well aerated, there is however  minimal soft tissue density within the mastoid air cells. No destructive bony lesions. Tegmen tympani is intact. Otic capsule intact. No expansion of the internal auditory canal. Minimally aerated petrous apex.  Left: External auditory canal is well formed. No tympanic membrane retraction. Middle ear appears well aerated. Scutum is sharp. Ossicles are well formed and located. Mastoid air cells are well aerated. Tegmen tympani intact. Otic capsule is unremarkable. No abnormal internal auditory canal expansion. Aerated petrous apex.  IMPRESSION: CT head: No acute intracranial process.  Acute on chronic paranasal sinusitis.  CT temporal bones: Soft tissue within the fundus of the external auditory canal, and patchy soft tissue throughout the middle air without erosions. This is nonspecific. Consider direct inspection.  Normal CT of the left temporal bones.   Electronically Signed   By: Awilda Metro   On: 08/12/2013 23:06    EKG Interpretation   None       MDM   1. Otitis media, right    Right ear pain for the past day. Felt pop and had some bleeding about an hour ago. No fever or vomiting.  Patient uncomfortable. Bulging eardrum on the right blood in the canal. No tragus or mastoid pain. Patient denies any trauma or sticking anything in the ear. Patient's pain is improved after pain medication and auralgan.  She is a "borderline" diabetic who takes metformin. Entertained possibility of malignant otitis externa. Imaging obtained.  No evidence of bony erosions. Pain resolved with auralgan.  D/w ENT Dr. Annalee Genta. With bulging TM and normal caliber ear canal, he favors otitis  media.  Will cover for otitis externa as well but he doubts malignant otitis externa. Patient's PCP started levaquin given PCN allergy. Add bactrim. Add ofloxocin gtts.   I personally performed the services described in this documentation, which was scribed in my presence. The recorded information has been reviewed and is accurate.   Glynn Octave, MD 08/13/13 [phone]

## 2013-08-12 NOTE — ED Notes (Signed)
Right ear pain since yesterday.  Diagnosed by pmd this morning with ear infection. Taking Levaquin and prednisone.  45 min ago pt felt pop and blood and pus ran out of right ear.  Sm amt of blood still in ear canal. Pt in obvious pain.

## 2013-08-13 MED ORDER — SULFAMETHOXAZOLE-TRIMETHOPRIM 800-160 MG PO TABS: 1.0000 | ORAL_TABLET | Freq: Two times a day (BID) | ORAL | Status: DC

## 2013-08-13 MED ORDER — OFLOXACIN 0.3 % OT SOLN: 5.0000 [drp] | Freq: Two times a day (BID) | OTIC | Status: AC

## 2013-09-26 ENCOUNTER — Telehealth: Payer: Self-pay

## 2013-09-26 NOTE — Telephone Encounter (Signed)
Spoke with patient but unable to talk. Has records that she is bringing with.

## 2013-09-27 ENCOUNTER — Encounter: Payer: Self-pay | Admitting: Internal Medicine

## 2013-09-27 ENCOUNTER — Ambulatory Visit (INDEPENDENT_AMBULATORY_CARE_PROVIDER_SITE_OTHER): Payer: Federal, State, Local not specified - PPO | Admitting: Internal Medicine

## 2013-09-27 VITALS — BP 103/67 | HR 74 | Temp 98.2°F | Ht 63.4 in | Wt 235.0 lb

## 2013-09-27 DIAGNOSIS — I25119 Atherosclerotic heart disease of native coronary artery with unspecified angina pectoris: Secondary | ICD-10-CM | POA: Insufficient documentation

## 2013-09-27 DIAGNOSIS — Z23 Encounter for immunization: Secondary | ICD-10-CM

## 2013-09-27 DIAGNOSIS — G4700 Insomnia, unspecified: Secondary | ICD-10-CM

## 2013-09-27 DIAGNOSIS — F341 Dysthymic disorder: Secondary | ICD-10-CM

## 2013-09-27 DIAGNOSIS — F419 Anxiety disorder, unspecified: Secondary | ICD-10-CM

## 2013-09-27 DIAGNOSIS — I2510 Atherosclerotic heart disease of native coronary artery without angina pectoris: Secondary | ICD-10-CM | POA: Insufficient documentation

## 2013-09-27 DIAGNOSIS — Z9861 Coronary angioplasty status: Secondary | ICD-10-CM | POA: Insufficient documentation

## 2013-09-27 DIAGNOSIS — F329 Major depressive disorder, single episode, unspecified: Secondary | ICD-10-CM

## 2013-09-27 DIAGNOSIS — E079 Disorder of thyroid, unspecified: Secondary | ICD-10-CM

## 2013-09-27 DIAGNOSIS — E119 Type 2 diabetes mellitus without complications: Secondary | ICD-10-CM

## 2013-09-27 DIAGNOSIS — L304 Erythema intertrigo: Secondary | ICD-10-CM | POA: Insufficient documentation

## 2013-09-27 DIAGNOSIS — F32A Depression, unspecified: Secondary | ICD-10-CM

## 2013-09-27 DIAGNOSIS — R21 Rash and other nonspecific skin eruption: Secondary | ICD-10-CM

## 2013-09-27 DIAGNOSIS — M810 Age-related osteoporosis without current pathological fracture: Secondary | ICD-10-CM

## 2013-09-27 DIAGNOSIS — M797 Fibromyalgia: Secondary | ICD-10-CM | POA: Insufficient documentation

## 2013-09-27 DIAGNOSIS — E7800 Pure hypercholesterolemia, unspecified: Secondary | ICD-10-CM

## 2013-09-27 DIAGNOSIS — K219 Gastro-esophageal reflux disease without esophagitis: Secondary | ICD-10-CM

## 2013-09-27 DIAGNOSIS — E785 Hyperlipidemia, unspecified: Secondary | ICD-10-CM | POA: Insufficient documentation

## 2013-09-27 DIAGNOSIS — I10 Essential (primary) hypertension: Secondary | ICD-10-CM

## 2013-09-27 DIAGNOSIS — N209 Urinary calculus, unspecified: Secondary | ICD-10-CM

## 2013-09-27 MED ORDER — DULOXETINE HCL 60 MG PO CPEP: 60.0000 mg | ORAL_CAPSULE | Freq: Every day | ORAL | Status: DC

## 2013-09-27 MED ORDER — FLUCONAZOLE 150 MG PO TABS: 150.0000 mg | ORAL_TABLET | Freq: Once | ORAL | Status: DC

## 2013-09-27 MED ORDER — ZOLPIDEM TARTRATE 5 MG PO TABS: 5.0000 mg | ORAL_TABLET | Freq: Every evening | ORAL | Status: DC | PRN

## 2013-09-27 MED ORDER — NYSTATIN-TRIAMCINOLONE 100000-0.1 UNIT/GM-% EX OINT: 1.0000 "application " | TOPICAL_OINTMENT | Freq: Two times a day (BID) | CUTANEOUS | Status: DC

## 2013-09-27 NOTE — Assessment & Plan Note (Signed)
Needs a refill of Cymbalta

## 2013-09-27 NOTE — Progress Notes (Signed)
   Subjective:    Patient ID: Melinda Mckinney, female    DOB: 10-15-1955, 58 y.o.   MRN: [id number]  DOS:  09/27/2013 New patient, tshe is here with her husband, likes to get established, she has multiple medical problems, has seen Dr. Jimmye Norman in [place] as her PCP for years. I asked to focus in the most pressing issues for today's visit :  --Was seen with otitis externa in December 2014, antibiotics, shortly afterward developed a itchy-red-rash @ B groin; PCP rx nystatin, not getting better. --Insomnia, not well controlled, in the past took something similar to Ambien 7.5 mg, it helped but made her  sleepy the next day. --History of achalasia, symptoms started again a few weeks ago: some regurgitation and dysphagia. No hematemesis.     Past Medical History  Diagnosis Date  . Hypertension   . GERD (gastroesophageal reflux disease)   . Diabetes mellitus   . High cholesterol   . Thyroid disease   . Anxiety and depression   . Fibromyalgia   . Abnormal Pap smear of cervix   . S/p nephrectomy   . CAD (coronary artery disease) ~2009    stent   . Osteoporosis   . Insomnia 09/27/2013    Past Surgical History  Procedure Laterality Date  . Nephrectomy Right 1991    in [place], due to fibrosis and lithiasis  . Esophagomyomectomy (heller)  2009  . Hernia repair      x 6 surgeries   . Cesarean section      x 3   . Knee arthroscopy Right     History   Social History  . Marital Status: Married    Spouse Name: N/A    Number of Children: 3  . Years of Education: N/A   Occupational History  . stay home     Social History Main Topics  . Smoking status: Former Research scientist (life sciences)  . Smokeless tobacco: Not on file     Comment: quit ~ 2000 (2 ppd)  . Alcohol Use: Yes     Comment: socially   . Drug Use: No  . Sexual Activity: Not on file   Other Topics Concern  . Not on file   Social History Narrative   Original from [place]   3 children, 2 alive     ROS  No fever chills, had  some nausea and diarrhea but no blood in the stools. Diarrhea happened after the antibiotics.     Objective:   Physical Exam  Skin:      BP 103/67  Pulse 74  Temp(Src) 98.2 F (36.8 C)  Ht 5' 3.4" (1.61 m)  Wt 235 lb (106.595 kg)  BMI 41.12 kg/m2  SpO2 99% General -- alert, well-developed, NAD.   Lungs -- normal respiratory effort, no intercostal retractions, no accessory muscle use, and normal breath sounds.  Heart-- normal rate, regular rhythm, no murmur.  Abdomen-- Not distended, good bowel sounds,soft, non-tender. Well healed surgical scars   Extremities-- no pretibial edema bilaterally  Neurologic--  alert & oriented X3.   Psych-- Cognition and judgment appear intact. Cooperative with normal attention span and concentration. Moderately anxious , no  depressed appearing.     Assessment & Plan:   Today , I spent more than 35 min with the patient, >50% of the time counseling, and gathering information about her extensive PMH, they showed me a number of medical documents in spanish from years ago.

## 2013-09-27 NOTE — Assessment & Plan Note (Signed)
See history of present illness, trial with Ambien 5 mg

## 2013-09-27 NOTE — Patient Instructions (Addendum)
Mycolog twice a day for one week Diflucan one tablet  A day x 3 days If the  groin is not improving in the next 5-6 days call the office  Hold the cholesterol medication for a week due to  Diflucan  Ambien 5 mg take one tablet at bedtime as needed for sleep  Please get records from your previous MD, Sign a release of information at the front desk, will need: X-rays, stress test, EKGs, ultrasounds, labs -last year-, last few notes.  Next visit in 6 weeks , fasting

## 2013-09-27 NOTE — Progress Notes (Signed)
Pre visit review using our clinic review tool, if applicable. No additional management support is needed unless otherwise documented below in the visit note. 

## 2013-09-27 NOTE — Assessment & Plan Note (Signed)
Seems well controlled, get  records

## 2013-09-27 NOTE — Assessment & Plan Note (Signed)
Get records

## 2013-09-27 NOTE — Assessment & Plan Note (Signed)
Rash and pruritus  after she took antibiotics, likely a fungal infection, not responding to nystatin, rash is slt atypical, less likely dx is cellulitis. Plan: mycolog, Diflucan, if not improving quickly she will let me know.

## 2013-09-27 NOTE — Assessment & Plan Note (Signed)
Symptoms have resurface. She is also due for a colonoscopy, reports polyps in 2007. Plan: Referred to her GI [place] Dr. Theotis Burrow

## 2013-09-27 NOTE — Assessment & Plan Note (Signed)
Reports osteoporosis diagnosed in 2007 T score of -2.9, and since then had other exams. Recommend to get records

## 2013-09-27 NOTE — Assessment & Plan Note (Signed)
History of a stent in the past, no details available, doesn't see cardiology

## 2013-09-27 NOTE — Assessment & Plan Note (Signed)
Takes allopurinol, denies history of gout, likely taking allopurinol for prevention of urolithiasis.

## 2013-09-30 ENCOUNTER — Telehealth: Payer: Self-pay | Admitting: Internal Medicine

## 2013-09-30 NOTE — Telephone Encounter (Signed)
Relevant patient education assigned to patient using Emmi. ° °

## 2013-10-03 ENCOUNTER — Telehealth: Payer: Self-pay

## 2013-10-03 NOTE — Telephone Encounter (Signed)
Relevant patient education assigned to patient using Emmi. ° °

## 2013-11-08 ENCOUNTER — Encounter: Payer: Self-pay | Admitting: Lab

## 2013-11-08 ENCOUNTER — Ambulatory Visit (INDEPENDENT_AMBULATORY_CARE_PROVIDER_SITE_OTHER): Payer: Federal, State, Local not specified - PPO | Admitting: Internal Medicine

## 2013-11-08 ENCOUNTER — Encounter: Payer: Self-pay | Admitting: Internal Medicine

## 2013-11-08 VITALS — BP 96/62 | HR 75 | Temp 98.2°F | Wt 231.0 lb

## 2013-11-08 DIAGNOSIS — E119 Type 2 diabetes mellitus without complications: Secondary | ICD-10-CM

## 2013-11-08 DIAGNOSIS — L538 Other specified erythematous conditions: Secondary | ICD-10-CM

## 2013-11-08 DIAGNOSIS — G4700 Insomnia, unspecified: Secondary | ICD-10-CM

## 2013-11-08 DIAGNOSIS — I2510 Atherosclerotic heart disease of native coronary artery without angina pectoris: Secondary | ICD-10-CM

## 2013-11-08 DIAGNOSIS — E7800 Pure hypercholesterolemia, unspecified: Secondary | ICD-10-CM

## 2013-11-08 DIAGNOSIS — M797 Fibromyalgia: Secondary | ICD-10-CM

## 2013-11-08 DIAGNOSIS — IMO0001 Reserved for inherently not codable concepts without codable children: Secondary | ICD-10-CM

## 2013-11-08 DIAGNOSIS — L304 Erythema intertrigo: Secondary | ICD-10-CM

## 2013-11-08 DIAGNOSIS — K219 Gastro-esophageal reflux disease without esophagitis: Secondary | ICD-10-CM

## 2013-11-08 DIAGNOSIS — I10 Essential (primary) hypertension: Secondary | ICD-10-CM

## 2013-11-08 LAB — LIPID PANEL
CHOLESTEROL: 211 mg/dL — AB (ref 0–200)
HDL: 64.6 mg/dL (ref >39.00)
LDL Cholesterol: 116 mg/dL — ABNORMAL HIGH (ref 0–99)
Total CHOL/HDL Ratio: 3
Triglycerides: 153 mg/dL — ABNORMAL HIGH (ref 0.0–149.0)
VLDL: 30.6 mg/dL (ref 0.0–40.0)

## 2013-11-08 LAB — TSH: TSH: 1.95 u[IU]/mL (ref 0.35–5.50)

## 2013-11-08 LAB — HEMOGLOBIN A1C: HEMOGLOBIN A1C: 5.6 % (ref 4.6–6.5)

## 2013-11-08 MED ORDER — NYSTATIN-TRIAMCINOLONE 100000-0.1 UNIT/GM-% EX OINT: 1.0000 "application " | TOPICAL_OINTMENT | Freq: Two times a day (BID) | CUTANEOUS | Status: DC

## 2013-11-08 MED ORDER — ZOLPIDEM TARTRATE 5 MG PO TABS: 5.0000 mg | ORAL_TABLET | Freq: Every evening | ORAL | Status: DC | PRN

## 2013-11-08 NOTE — Progress Notes (Signed)
Pre visit review using our clinic review tool, if applicable. No additional management support is needed unless otherwise documented below in the visit note. 

## 2013-11-08 NOTE — Assessment & Plan Note (Signed)
I haven't received any old records.

## 2013-11-08 NOTE — Assessment & Plan Note (Signed)
Great response to Ambien, RF PRN

## 2013-11-08 NOTE — Progress Notes (Signed)
Subjective:    Patient ID: Melinda Mckinney, female    DOB: 05-Jul-1956, 58 y.o.   MRN: [id number]  DOS:  11/08/2013 Type of  visit:  Followup from previous visit , Here with her husband GI--had EGD, colonoscopy pending. Diabetes, good compliance with medications, no ambulatory CBGs Insomnia, Ambien works great. Intertrigo, responded nicely to Diflucan -mycolog but has developed some rash under the right breast. High cholesterol, good compliance with medications Fibromyalgia, on Cymbalta, symptoms well controlled    ROS Reports she was under a lot of stress due to a recent wedding (son), stress has decrease, feels better. Denies chest pain or difficulty breathing No nausea, vomiting, diarrhea  Past Medical History  Diagnosis Date  . Hypertension   . GERD (gastroesophageal reflux disease)   . Diabetes mellitus   . High cholesterol   . Thyroid disease   . Anxiety and depression   . Fibromyalgia   . Abnormal Pap smear of cervix   . S/p nephrectomy   . CAD (coronary artery disease) ~2009    stent   . Osteoporosis   . Insomnia 09/27/2013    Past Surgical History  Procedure Laterality Date  . Nephrectomy Right 1991    in [place], due to fibrosis and lithiasis  . Esophagomyomectomy (heller)  2009  . Hernia repair      x 6 surgeries   . Cesarean section      x 3   . Knee arthroscopy Right     History   Social History  . Marital Status: Married    Spouse Name: N/A    Number of Children: 3  . Years of Education: N/A   Occupational History  . stay home     Social History Main Topics  . Smoking status: Former Research scientist (life sciences)  . Smokeless tobacco: Not on file     Comment: quit ~ 2000 (2 ppd)  . Alcohol Use: Yes     Comment: socially   . Drug Use: No  . Sexual Activity: Not on file   Other Topics Concern  . Not on file   Social History Narrative   Original from [place]   3 children, 2 alive         Medication List       This list is accurate as of: 11/08/13   5:10 PM.  Always use your most recent med list.               allopurinol 100 MG tablet  Commonly known as:  ZYLOPRIM  Take 100 mg by mouth daily.     DULoxetine 60 MG capsule  Commonly known as:  CYMBALTA  Take 1 capsule (60 mg total) by mouth daily.     hydrochlorothiazide 50 MG tablet  Commonly known as:  HYDRODIURIL  Take 50 mg by mouth daily.     levothyroxine 100 MCG tablet  Commonly known as:  SYNTHROID, LEVOTHROID  Take 100 mcg by mouth daily.     lisinopril 10 MG tablet  Commonly known as:  PRINIVIL,ZESTRIL  Take 10 mg by mouth daily.     lovastatin 20 MG tablet  Commonly known as:  MEVACOR  Take 20 mg by mouth at bedtime.     metFORMIN 500 MG tablet  Commonly known as:  GLUCOPHAGE  Take 500 mg by mouth 2 (two) times daily with a meal.     nystatin-triamcinolone ointment  Commonly known as:  MYCOLOG  Apply 1 application topically 2 (two) times daily.  omeprazole 20 MG capsule  Commonly known as:  PRILOSEC  Take 20 mg by mouth daily.     zolpidem 5 MG tablet  Commonly known as:  AMBIEN  Take 1 tablet (5 mg total) by mouth at bedtime as needed for sleep.           Objective:   Physical Exam BP 96/62  Pulse 75  Temp(Src) 98.2 F (36.8 C)  Wt 231 lb (104.781 kg)  SpO2 97%  General -- alert, well-developed, NAD.   Lungs -- normal respiratory effort, no intercostal retractions, no accessory muscle use, and normal breath sounds.  Heart-- normal rate, regular rhythm, no murmur.   Skin-- Mild erythema, dry, under right breast Extremities-- no pretibial edema bilaterally  Neurologic--  alert & oriented X3. Speech normal, gait normal, strength normal in all extremities.  Psych-- Cognition and judgment appear intact. Cooperative with normal attention span and concentration. No anxious or depressed appearing.      Assessment & Plan:

## 2013-11-08 NOTE — Assessment & Plan Note (Signed)
Fasting, good compliance with Mevacor, labs

## 2013-11-08 NOTE — Assessment & Plan Note (Signed)
Today, patient reports she's taking Cymbalta for fibromyalgia, symptoms well controlled.

## 2013-11-08 NOTE — Patient Instructions (Signed)
Get your blood work before you leave     Next visit is for routine check up regards your blood sugar , blood pressure ,  in 3 months  No need to come back fasting Please make an appointment    I still need to see the records from her previous Dr.  Karenann Cai Mycolog as needed for rash

## 2013-11-08 NOTE — Assessment & Plan Note (Addendum)
Groin rash resolved, has mild rash under the right breast, rx Mycolog as needed

## 2013-11-08 NOTE — Assessment & Plan Note (Signed)
Reports she had EGD since the last time she was here. Has a colonoscopy pending. Was noted to have anemia, recheck on return to the office

## 2013-11-08 NOTE — Assessment & Plan Note (Signed)
Due for a A1c, continue metformin

## 2013-11-08 NOTE — Assessment & Plan Note (Signed)
BP slightly elevated but she feels at baseline, no change

## 2013-11-14 ENCOUNTER — Other Ambulatory Visit: Payer: Self-pay | Admitting: Internal Medicine

## 2013-11-14 ENCOUNTER — Encounter: Payer: Self-pay | Admitting: *Deleted

## 2013-11-14 MED ORDER — LOVASTATIN 40 MG PO TABS: 40.0000 mg | ORAL_TABLET | Freq: Every day | ORAL | Status: DC

## 2013-11-21 ENCOUNTER — Telehealth: Payer: Self-pay

## 2013-11-21 NOTE — Telephone Encounter (Signed)
Relevant patient education assigned to patient using Emmi. ° °

## 2013-11-24 ENCOUNTER — Other Ambulatory Visit: Payer: Self-pay | Admitting: Internal Medicine

## 2013-12-24 ENCOUNTER — Telehealth: Payer: Self-pay | Admitting: Internal Medicine

## 2013-12-24 NOTE — Telephone Encounter (Signed)
Caller name:Kemonie Kehm Relation to QT:[id number] Call back number:[phone] Pharmacy:[id number] - [place], [place]   Reason for call: patient came in to request a refill for allopurinol 100mg , Duloxetine 60 mg, Omeprazole 20 mg and Zolpidem 5 mg. Patient would like a 90 day supply for all of them because [place]. Please advise

## 2013-12-25 MED ORDER — DULOXETINE HCL 60 MG PO CPEP: ORAL_CAPSULE | ORAL | Status: DC

## 2013-12-25 MED ORDER — OMEPRAZOLE 20 MG PO CPDR: 20.0000 mg | DELAYED_RELEASE_CAPSULE | Freq: Every day | ORAL | Status: DC

## 2013-12-25 MED ORDER — ZOLPIDEM TARTRATE 5 MG PO TABS: 5.0000 mg | ORAL_TABLET | Freq: Every evening | ORAL | Status: DC | PRN

## 2013-12-25 MED ORDER — ALLOPURINOL 100 MG PO TABS: 100.0000 mg | ORAL_TABLET | Freq: Every day | ORAL | Status: DC

## 2013-12-25 NOTE — Telephone Encounter (Signed)
Done

## 2013-12-25 NOTE — Telephone Encounter (Signed)
Ok--6 months 

## 2013-12-25 NOTE — Telephone Encounter (Signed)
Ambien 5 mg  Last OV- 11/08/13 Last refilled- #30 / 4 rf  Allopurinol 100mg  - historical provider  Okay to refill?

## 2013-12-25 NOTE — Telephone Encounter (Signed)
ambien faxed [facility] [place]

## 2014-01-06 ENCOUNTER — Telehealth: Payer: Self-pay | Admitting: Internal Medicine

## 2014-01-06 NOTE — Telephone Encounter (Signed)
Records from previous PCP review at 12/2011 TSH 6.2, triglycerides 332, A1c 6.0 07-2012: Cholesterol 205, LDL 102, triglycerides 118, A1c 5.8 EGD 10/30/2013: Achalasia, Barrett's, repeat in one year Coloscopy 11/21/2013, poor prep, 2 adenomas, repeat in 2 years. No information regards CAD. Will scan only relevant records

## 2014-01-07 ENCOUNTER — Encounter: Payer: Self-pay | Admitting: Internal Medicine

## 2014-02-10 ENCOUNTER — Ambulatory Visit: Payer: Federal, State, Local not specified - PPO | Admitting: Internal Medicine

## 2014-03-19 ENCOUNTER — Ambulatory Visit: Payer: Federal, State, Local not specified - PPO | Admitting: Internal Medicine

## 2014-04-16 ENCOUNTER — Ambulatory Visit (INDEPENDENT_AMBULATORY_CARE_PROVIDER_SITE_OTHER): Payer: Federal, State, Local not specified - PPO | Admitting: Internal Medicine

## 2014-04-16 ENCOUNTER — Encounter: Payer: Self-pay | Admitting: Internal Medicine

## 2014-04-16 VITALS — BP 132/68 | HR 77 | Temp 98.3°F | Wt 246.0 lb

## 2014-04-16 DIAGNOSIS — M797 Fibromyalgia: Secondary | ICD-10-CM

## 2014-04-16 DIAGNOSIS — E079 Disorder of thyroid, unspecified: Secondary | ICD-10-CM

## 2014-04-16 DIAGNOSIS — L304 Erythema intertrigo: Secondary | ICD-10-CM

## 2014-04-16 DIAGNOSIS — IMO0001 Reserved for inherently not codable concepts without codable children: Secondary | ICD-10-CM

## 2014-04-16 DIAGNOSIS — E7800 Pure hypercholesterolemia, unspecified: Secondary | ICD-10-CM

## 2014-04-16 DIAGNOSIS — L538 Other specified erythematous conditions: Secondary | ICD-10-CM

## 2014-04-16 DIAGNOSIS — I10 Essential (primary) hypertension: Secondary | ICD-10-CM

## 2014-04-16 LAB — BASIC METABOLIC PANEL
BUN: 16 mg/dL (ref 6–23)
CHLORIDE: 104 meq/L (ref 96–112)
CO2: 26 meq/L (ref 19–32)
Calcium: 9.6 mg/dL (ref 8.4–10.5)
Creatinine, Ser: 0.9 mg/dL (ref 0.4–1.2)
GFR: 66.92 mL/min (ref >60.00)
Glucose, Bld: 103 mg/dL — ABNORMAL HIGH (ref 70–99)
POTASSIUM: 3.7 meq/L (ref 3.5–5.1)
Sodium: 140 mEq/L (ref 135–145)

## 2014-04-16 LAB — VITAMIN B12: Vitamin B-12: 225 pg/mL (ref 211–911)

## 2014-04-16 LAB — FOLATE: Folate: 19.7 ng/mL (ref >5.9)

## 2014-04-16 LAB — SEDIMENTATION RATE: SED RATE: 33 mm/h — AB (ref 0–22)

## 2014-04-16 LAB — TSH: TSH: 0.96 u[IU]/mL (ref 0.35–4.50)

## 2014-04-16 LAB — CK: CK TOTAL: 47 U/L (ref 7–177)

## 2014-04-16 MED ORDER — LEVOTHYROXINE SODIUM 100 MCG PO TABS: 100.0000 ug | ORAL_TABLET | Freq: Every day | ORAL | Status: DC

## 2014-04-16 MED ORDER — CANDESARTAN CILEXETIL 32 MG PO TABS: 32.0000 mg | ORAL_TABLET | Freq: Every day | ORAL | Status: DC

## 2014-04-16 MED ORDER — OMEPRAZOLE 20 MG PO CPDR: 20.0000 mg | DELAYED_RELEASE_CAPSULE | Freq: Every day | ORAL | Status: DC

## 2014-04-16 MED ORDER — CYCLOBENZAPRINE HCL 10 MG PO TABS: 10.0000 mg | ORAL_TABLET | Freq: Two times a day (BID) | ORAL | Status: DC | PRN

## 2014-04-16 NOTE — Assessment & Plan Note (Signed)
Fibromyalgia symptoms resurface 3 months ago, she was in [place] and under a lot of stress. Has generalized pain without any evidence of synovitis on exam. She also has back pain, previously has seen a specialist and  she was not recommended surgery. Plan: Check sedimentation rate, CPK, G90, folic acid. Continue with Cymbalta. Tylenol as needed Flexeril twice a day Reassess in 3 months

## 2014-04-16 NOTE — Patient Instructions (Addendum)
Get your blood work before you leave   No tome lovastatin por un mes luego empieze de nuevo, quirero ver si la fibromyalgia le [employer] flexeril 2 veces al dia solo si tiene dolor Tome tylenol de 500 mg 2 tabletas 3 veces al dia si tiene dolor   tomese la presion 2 veces por semana, si esta mas de 145 o menos de 110, llame a la oficina       Please come back to the office in 3 months      Stop by the front desk and schedule the visit   Reason for the visit: follow up Fasting ?   Yes  No  Depending on your labs,  you may need to come back

## 2014-04-16 NOTE — Assessment & Plan Note (Signed)
Recheck a TSH

## 2014-04-16 NOTE — Assessment & Plan Note (Signed)
Reports she still has a rash, refer to dermatology

## 2014-04-16 NOTE — Assessment & Plan Note (Addendum)
Reports that BP was quite elevated recently when she was visiting [place]. BP today very good. Today, she also states that is taking candesartan in addition to lisinopril "for a while". I usually don't combine both medications but as long as her BP is okay  and creatinine is normal, will continue w/ present care Plan: BMP, monitor BPs

## 2014-04-16 NOTE — Progress Notes (Signed)
Pre visit review using our clinic review tool, if applicable. No additional management support is needed unless otherwise documented below in the visit note. 

## 2014-04-16 NOTE — Progress Notes (Signed)
Subjective:    Patient ID: Melinda Mckinney, female    DOB: 04-28-1956, 58 y.o.   MRN: [id number]  DOS:  04/16/2014 Type of visit - description : f/u , multiple issues, here w/ husband Interval history: Hypertension, she visited [place]  for the last 3 months, 4 weeks ago went to a local hospital with elevated BP, they did labs and EKGs, apparently gave her clonidine, BP decreased to normal and she was released home. No ambulatory BPs since then. Fibromyalgia, reports generalized pain for the last 3 months, admits to stress when she was visiting [place]. Pain is in all joints, all muscles. Also having back pain. High cholesterol, I recommended lovastatin 40 mg, unclear if the patient is taking that or not.    ROS Denies fever chills, no weight loss, has gained 15 pounds. No nausea, vomiting, diarrhea. No anxiety depression, she has mild insomnia she think related to fibromyalgia. No  bowel incontinence, sometimes difficulty holding her urine.    Past Medical History  Diagnosis Date  . Hypertension   . GERD (gastroesophageal reflux disease)   . Diabetes mellitus   . High cholesterol   . Thyroid disease   . Anxiety and depression   . Fibromyalgia   . Abnormal Pap smear of cervix   . S/p nephrectomy   . CAD (coronary artery disease) ~2009    stent   . Osteoporosis   . Insomnia 09/27/2013    Past Surgical History  Procedure Laterality Date  . Nephrectomy Right 1991    in [place], due to fibrosis and lithiasis  . Esophagomyomectomy (heller)  2009  . Hernia repair      x 6 surgeries   . Cesarean section      x 3   . Knee arthroscopy Right     History   Social History  . Marital Status: Married    Spouse Name: N/A    Number of Children: 3  . Years of Education: N/A   Occupational History  . stay home     Social History Main Topics  . Smoking status: Former Research scientist (life sciences)  . Smokeless tobacco: Not on file     Comment: quit ~ 2000 (2 ppd)  . Alcohol Use: Yes   Comment: socially   . Drug Use: No  . Sexual Activity: Not on file   Other Topics Concern  . Not on file   Social History Narrative   Original from [place]   3 children, 2 alive         Medication List       This list is accurate as of: 04/16/14  4:43 PM.  Always use your most recent med list.               allopurinol 100 MG tablet  Commonly known as:  ZYLOPRIM  Take 1 tablet (100 mg total) by mouth daily.     candesartan 32 MG tablet  Commonly known as:  ATACAND  Take 1 tablet (32 mg total) by mouth daily.     cyclobenzaprine 10 MG tablet  Commonly known as:  FLEXERIL  Take 1 tablet (10 mg total) by mouth 2 (two) times daily as needed for muscle spasms.     DULoxetine 60 MG capsule  Commonly known as:  CYMBALTA  TAKE ONE CAPSULE BY MOUTH EVERY DAY     hydrochlorothiazide 50 MG tablet  Commonly known as:  HYDRODIURIL  Take 50 mg by mouth daily.  levothyroxine 100 MCG tablet  Commonly known as:  SYNTHROID, LEVOTHROID  Take 1 tablet (100 mcg total) by mouth daily.     lisinopril 10 MG tablet  Commonly known as:  PRINIVIL,ZESTRIL  Take 10 mg by mouth daily.     lovastatin 40 MG tablet  Commonly known as:  MEVACOR  Take 1 tablet (40 mg total) by mouth at bedtime.     metFORMIN 500 MG tablet  Commonly known as:  GLUCOPHAGE  Take 500 mg by mouth 2 (two) times daily with a meal.     nystatin-triamcinolone ointment  Commonly known as:  MYCOLOG  Apply 1 application topically 2 (two) times daily.     omeprazole 20 MG capsule  Commonly known as:  PRILOSEC  Take 1 capsule (20 mg total) by mouth daily.     zolpidem 5 MG tablet  Commonly known as:  AMBIEN  Take 1 tablet (5 mg total) by mouth at bedtime as needed for sleep.           Objective:   Physical Exam BP 132/68  Pulse 77  Temp(Src) 98.3 F (36.8 C) (Oral)  Wt 246 lb 0.5 oz (111.6 kg)  SpO2 98%  General -- alert, well-developed,  Seems to be in generalized pain that she does not look  toxic or ill.Marland Kitchen  HEENT-- Not pale.  Lungs -- normal respiratory effort, no intercostal retractions, no accessory muscle use, and normal breath sounds.  Heart-- normal rate, regular rhythm, no murmur.   Extremities-- no pretibial edema bilaterally ; No redness, swelling, warmness at  elbows, wrists, hands or ankles. She is TTP at all joints and muscle groups. Neurologic--  alert & oriented X3. Speech normal, gait limited by pain, strength symmetric and appropriate for age.  DTRs symmetric. Psych-- Cognition and judgment appear intact. Cooperative with normal attention span and concentration. No anxious or depressed appearing.        Assessment & Plan:   F2F > 32 minutes

## 2014-04-16 NOTE — Assessment & Plan Note (Addendum)
Unclear if she's taking lovastatin 40; she has severe fibromyalgia sx . Recommend to hold lovastatin for a month then restart

## 2014-04-17 ENCOUNTER — Encounter: Payer: Self-pay | Admitting: Internal Medicine

## 2014-05-16 ENCOUNTER — Other Ambulatory Visit: Payer: Self-pay

## 2014-05-16 MED ORDER — CYCLOBENZAPRINE HCL 10 MG PO TABS: 10.0000 mg | ORAL_TABLET | Freq: Two times a day (BID) | ORAL | Status: DC | PRN

## 2014-07-18 ENCOUNTER — Ambulatory Visit: Payer: Federal, State, Local not specified - PPO | Admitting: Internal Medicine

## 2014-07-18 ENCOUNTER — Other Ambulatory Visit: Payer: Self-pay

## 2014-07-24 ENCOUNTER — Ambulatory Visit (INDEPENDENT_AMBULATORY_CARE_PROVIDER_SITE_OTHER): Payer: Federal, State, Local not specified - PPO

## 2014-07-24 ENCOUNTER — Encounter: Payer: Self-pay | Admitting: Internal Medicine

## 2014-07-24 ENCOUNTER — Ambulatory Visit (INDEPENDENT_AMBULATORY_CARE_PROVIDER_SITE_OTHER): Payer: Federal, State, Local not specified - PPO | Admitting: Internal Medicine

## 2014-07-24 VITALS — BP 99/65 | HR 77 | Temp 98.0°F | Ht 63.0 in | Wt 241.5 lb

## 2014-07-24 DIAGNOSIS — D649 Anemia, unspecified: Secondary | ICD-10-CM | POA: Insufficient documentation

## 2014-07-24 DIAGNOSIS — Z23 Encounter for immunization: Secondary | ICD-10-CM

## 2014-07-24 DIAGNOSIS — I10 Essential (primary) hypertension: Secondary | ICD-10-CM

## 2014-07-24 DIAGNOSIS — Z0000 Encounter for general adult medical examination without abnormal findings: Secondary | ICD-10-CM

## 2014-07-24 DIAGNOSIS — I2510 Atherosclerotic heart disease of native coronary artery without angina pectoris: Secondary | ICD-10-CM

## 2014-07-24 DIAGNOSIS — R300 Dysuria: Secondary | ICD-10-CM

## 2014-07-24 DIAGNOSIS — M810 Age-related osteoporosis without current pathological fracture: Secondary | ICD-10-CM

## 2014-07-24 LAB — URINALYSIS, ROUTINE W REFLEX MICROSCOPIC
Bilirubin Urine: NEGATIVE
Hgb urine dipstick: NEGATIVE
KETONES UR: NEGATIVE
NITRITE: NEGATIVE
Total Protein, Urine: NEGATIVE
UROBILINOGEN UA: 0.2 (ref 0.0–1.0)
Urine Glucose: NEGATIVE
pH: 5 (ref 5.0–8.0)

## 2014-07-24 LAB — HEMOGLOBIN A1C: Hgb A1c MFr Bld: 6 % (ref 4.6–6.5)

## 2014-07-24 LAB — LIPID PANEL
Cholesterol: 205 mg/dL — ABNORMAL HIGH (ref 0–200)
HDL: 72.4 mg/dL (ref >39.00)
LDL Cholesterol: 104 mg/dL — ABNORMAL HIGH (ref 0–99)
NonHDL: 132.6
TRIGLYCERIDES: 144 mg/dL (ref 0.0–149.0)
Total CHOL/HDL Ratio: 3
VLDL: 28.8 mg/dL (ref 0.0–40.0)

## 2014-07-24 LAB — CBC WITH DIFFERENTIAL/PLATELET
BASOS ABS: 0.1 10*3/uL (ref 0.0–0.1)
Basophils Relative: 1 % (ref 0.0–3.0)
EOS ABS: 0.2 10*3/uL (ref 0.0–0.7)
Eosinophils Relative: 3.2 % (ref 0.0–5.0)
HEMATOCRIT: 33.5 % — AB (ref 36.0–46.0)
HEMOGLOBIN: 11.1 g/dL — AB (ref 12.0–15.0)
LYMPHS ABS: 2 10*3/uL (ref 0.7–4.0)
Lymphocytes Relative: 29.9 % (ref 12.0–46.0)
MCHC: 33.3 g/dL (ref 30.0–36.0)
MCV: 89 fl (ref 78.0–100.0)
Monocytes Absolute: 0.4 10*3/uL (ref 0.1–1.0)
Monocytes Relative: 5.3 % (ref 3.0–12.0)
NEUTROS ABS: 4 10*3/uL (ref 1.4–7.7)
Neutrophils Relative %: 60.6 % (ref 43.0–77.0)
PLATELETS: 350 10*3/uL (ref 150.0–400.0)
RBC: 3.76 Mil/uL — ABNORMAL LOW (ref 3.87–5.11)
RDW: 13.9 % (ref 11.5–15.5)
WBC: 6.6 10*3/uL (ref 4.0–10.5)

## 2014-07-24 LAB — ALT: ALT: 12 U/L (ref 0–35)

## 2014-07-24 LAB — FOLATE: Folate: 18.4 ng/mL (ref >5.9)

## 2014-07-24 LAB — AST: AST: 18 U/L (ref 0–37)

## 2014-07-24 LAB — IRON: IRON: 92 ug/dL (ref 42–145)

## 2014-07-24 MED ORDER — CANDESARTAN CILEXETIL 32 MG PO TABS: 32.0000 mg | ORAL_TABLET | Freq: Every day | ORAL | Status: DC

## 2014-07-24 MED ORDER — ALLOPURINOL 100 MG PO TABS: 100.0000 mg | ORAL_TABLET | Freq: Every day | ORAL | Status: DC

## 2014-07-24 MED ORDER — LOVASTATIN 40 MG PO TABS: 40.0000 mg | ORAL_TABLET | Freq: Every day | ORAL | Status: DC

## 2014-07-24 MED ORDER — METFORMIN HCL 500 MG PO TABS: 500.0000 mg | ORAL_TABLET | Freq: Two times a day (BID) | ORAL | Status: DC

## 2014-07-24 MED ORDER — HYDROCHLOROTHIAZIDE 50 MG PO TABS
50.0000 mg | ORAL_TABLET | Freq: Every day | ORAL | Status: DC
Start: 2014-07-24 — End: 2014-12-23

## 2014-07-24 MED ORDER — OMEPRAZOLE 20 MG PO CPDR: 20.0000 mg | DELAYED_RELEASE_CAPSULE | Freq: Every day | ORAL | Status: DC

## 2014-07-24 MED ORDER — DULOXETINE HCL 60 MG PO CPEP: ORAL_CAPSULE | ORAL | Status: DC

## 2014-07-24 MED ORDER — NYSTATIN-TRIAMCINOLONE 100000-0.1 UNIT/GM-% EX OINT: 1.0000 "application " | TOPICAL_OINTMENT | Freq: Two times a day (BID) | CUTANEOUS | Status: DC

## 2014-07-24 MED ORDER — LEVOTHYROXINE SODIUM 100 MCG PO TABS: 100.0000 ug | ORAL_TABLET | Freq: Every day | ORAL | Status: DC

## 2014-07-24 MED ORDER — LISINOPRIL 10 MG PO TABS: 10.0000 mg | ORAL_TABLET | Freq: Every day | ORAL | Status: DC

## 2014-07-24 NOTE — Assessment & Plan Note (Signed)
Anemia Colonoscopy 11/21/2013, repeat in 2 years EGD 10-2013, repeat in one year Labs

## 2014-07-24 NOTE — Progress Notes (Signed)
Pre visit review using our clinic review tool, if applicable. No additional management support is needed unless otherwise documented below in the visit note. 

## 2014-07-24 NOTE — Assessment & Plan Note (Addendum)
Td-- 2015 pnm shot today Flu shot today  11/21/2013---  Cscope done, repeat in 2 years  Dr Ferdinand Lango   Due for a mmg-- referral enter Needs to see gyn-- referral diet exercise discussed

## 2014-07-24 NOTE — Assessment & Plan Note (Signed)
BP slightly low today, at home usually 120, 130. She feels well today. No change

## 2014-07-24 NOTE — Assessment & Plan Note (Signed)
Osteoporosis Check a bone density test

## 2014-07-24 NOTE — Progress Notes (Signed)
Subjective:    Patient ID: Melinda Mckinney, female    DOB: [birth year], 58 y.o.   MRN: [id number]  DOS:  07/24/2014 Type of visit - description : CPX Interval history: B low back pain yesterday (different from FM pain) along with dysuria., Back pain resolved, continue with dysuria. Denies fever or abdominal pain.   ROS No chest pain or difficulty breathing No nausea, vomiting, diarrhea. No cough, chest congestion or sputum production. No gross hematuria, some urinary frequency for the last day  Past Medical History  Diagnosis Date  . Hypertension   . GERD (gastroesophageal reflux disease)   . Diabetes mellitus   . High cholesterol   . Thyroid disease   . Anxiety and depression   . Fibromyalgia   . Abnormal Pap smear of cervix   . S/p nephrectomy   . CAD (coronary artery disease) ~2009    stent   . Osteoporosis   . Insomnia 09/27/2013    Past Surgical History  Procedure Laterality Date  . Nephrectomy Right 1991    in [place], due to fibrosis and lithiasis  . Esophagomyomectomy (heller)  2009  . Hernia repair      x 6 surgeries   . Cesarean section      x 3   . Knee arthroscopy Right     History   Social History  . Marital Status: Married    Spouse Name: N/A    Number of Children: 3  . Years of Education: N/A   Occupational History  . stay home     Social History Main Topics  . Smoking status: Former Research scientist (life sciences)  . Smokeless tobacco: Not on file     Comment: quit ~ 2000 (2 ppd)  . Alcohol Use: Yes     Comment: socially   . Drug Use: No  . Sexual Activity: Not on file   Other Topics Concern  . Not on file   Social History Narrative   Original from [place]   3 children, 2 alive   Household --pt and husband      Family History  Problem Relation Age of Onset  . Colon cancer Neg Hx   . Breast cancer Other     aunt   . CAD Brother   . Lung cancer Mother     F and M  . Diabetes Father     F and mother       Medication List       This list  is accurate as of: 07/24/14  1:50 PM.  Always use your most recent med list.               allopurinol 100 MG tablet  Commonly known as:  ZYLOPRIM  Take 1 tablet (100 mg total) by mouth daily.     aspirin 81 MG tablet  Take 81 mg by mouth daily.     candesartan 32 MG tablet  Commonly known as:  ATACAND  Take 1 tablet (32 mg total) by mouth daily.     cyclobenzaprine 10 MG tablet  Commonly known as:  FLEXERIL  Take 1 tablet (10 mg total) by mouth 2 (two) times daily as needed for muscle spasms.     DULoxetine 60 MG capsule  Commonly known as:  CYMBALTA  TAKE ONE CAPSULE BY MOUTH EVERY DAY     hydrochlorothiazide 50 MG tablet  Commonly known as:  HYDRODIURIL  Take 1 tablet (50 mg total) by mouth daily.  levothyroxine 100 MCG tablet  Commonly known as:  SYNTHROID, LEVOTHROID  Take 1 tablet (100 mcg total) by mouth daily.     lisinopril 10 MG tablet  Commonly known as:  PRINIVIL,ZESTRIL  Take 1 tablet (10 mg total) by mouth daily.     lovastatin 40 MG tablet  Commonly known as:  MEVACOR  Take 1 tablet (40 mg total) by mouth at bedtime.     metFORMIN 500 MG tablet  Commonly known as:  GLUCOPHAGE  Take 1 tablet (500 mg total) by mouth 2 (two) times daily with a meal.     nystatin-triamcinolone ointment  Commonly known as:  MYCOLOG  Apply 1 application topically 2 (two) times daily.     omeprazole 20 MG capsule  Commonly known as:  PRILOSEC  Take 1 capsule (20 mg total) by mouth daily.     zolpidem 5 MG tablet  Commonly known as:  AMBIEN  Take 1 tablet (5 mg total) by mouth at bedtime as needed for sleep.           Objective:   Physical Exam BP 99/65 mmHg  Pulse 77  Temp(Src) 98 F (36.7 C) (Oral)  Ht 5\' 3"  (1.6 m)  Wt 241 lb 8 oz (109.544 kg)  BMI 42.79 kg/m2  SpO2 94%  General -- alert, well-developed, NAD.  Neck --no thyromegaly , normal carotid pulse  HEENT-- Not pale.   Lungs -- normal respiratory effort, no intercostal retractions, no  accessory muscle use, and normal breath sounds.  Heart-- normal rate, regular rhythm, no murmur.  Abdomen-- Not distended, good bowel sounds,soft, non-tender. Extremities-- no pretibial edema bilaterally  Neurologic--  alert & oriented X3. Speech normal, gait appropriate for age, strength symmetric and appropriate for age.  Psych-- Cognition and judgment appear intact. Cooperative with normal attention span and concentration. No anxious or depressed appearing.     Assessment & Plan:  UTI? Check a ua, ucx  Diabetes Not ambulatory CBGs, check A1c Insomnia, well-controlled with Ambien

## 2014-07-24 NOTE — Patient Instructions (Signed)
Get your blood work before you leave     Please come back to the office in 4-5 months for a routine check up   

## 2014-07-24 NOTE — Assessment & Plan Note (Signed)
CAD EKG normal sinus rhythm, asymptomatic, plan is to control cardiovascular risk factors Start ASA

## 2014-07-26 LAB — URINE CULTURE
Colony Count: NO GROWTH
Organism ID, Bacteria: NO GROWTH

## 2014-07-31 ENCOUNTER — Telehealth: Payer: Self-pay

## 2014-07-31 MED ORDER — LOVASTATIN 40 MG PO TABS: ORAL_TABLET | ORAL | Status: DC

## 2014-07-31 NOTE — Telephone Encounter (Signed)
Lovastatin 40 mg increased to 2 tablets daily, sent rx to [facility].

## 2014-07-31 NOTE — Telephone Encounter (Signed)
-----   Message from Colon Branch, MD sent at 07/30/2014  5:15 PM EST ----- Regarding: RX Increasing  lovastatin 40 mg to 2 tablets daily Please send a   prescription for the new sig, patient is already aware

## 2014-08-05 LAB — HM MAMMOGRAPHY: HM MAMMO: NORMAL

## 2014-08-19 ENCOUNTER — Encounter: Payer: Self-pay | Admitting: Internal Medicine

## 2014-08-20 ENCOUNTER — Ambulatory Visit (INDEPENDENT_AMBULATORY_CARE_PROVIDER_SITE_OTHER): Payer: Federal, State, Local not specified - PPO | Admitting: Gynecology

## 2014-08-20 ENCOUNTER — Encounter: Payer: Self-pay | Admitting: Gynecology

## 2014-08-20 ENCOUNTER — Other Ambulatory Visit (HOSPITAL_COMMUNITY)
Admission: RE | Admit: 2014-08-20 | Discharge: 2014-08-20 | Disposition: A | Discharge status: Home / Self Care | Payer: Federal, State, Local not specified - PPO | Source: Ambulatory Visit | Attending: Gynecology | Admitting: Gynecology

## 2014-08-20 VITALS — BP 128/86 | Ht 62.0 in | Wt 242.0 lb

## 2014-08-20 DIAGNOSIS — Z01419 Encounter for gynecological examination (general) (routine) without abnormal findings: Secondary | ICD-10-CM | POA: Diagnosis not present

## 2014-08-20 DIAGNOSIS — N905 Atrophy of vulva: Secondary | ICD-10-CM | POA: Insufficient documentation

## 2014-08-20 DIAGNOSIS — Z8739 Personal history of other diseases of the musculoskeletal system and connective tissue: Secondary | ICD-10-CM

## 2014-08-20 DIAGNOSIS — Z1151 Encounter for screening for human papillomavirus (HPV): Secondary | ICD-10-CM | POA: Insufficient documentation

## 2014-08-20 NOTE — Patient Instructions (Signed)
Incontinencia urinaria ( Urinary Incontinence) La incontinencia urinaria es la prdida involuntaria de orina de la vejiga. CAUSAS  Hay muchas causas de incontinencia urinaria. Ellas son:  Medicamentos.  Infecciones.  Agrandamiento prosttico que causa un aumento del flujo de [place] de la vejiga.  Ciruga.  Enfermedades neurolgicas.  Factores emocionales. SIGNOS Y SNTOMAS Hay cuatro tipos de incontinencia urinaria: 1. Incontinencia urinaria de urgencia: es la prdida involuntaria de orina antes de llegar al bao. Hay una urgencia repentina de orinar pero no llega a tiempo al bao. 2. Incontinencia por estrs: es la prdida repentina de orina con cualquier actividad que fuerza a Art gallery manager orina. La causa ms frecuente son los cambios anatmicos en la pelvis y las zonas del esfnter. 3. Incontinencia por rebosamiento: es la prdida de orina por una obstruccin de la apertura de la vejiga. Esto causa un retroceso de la orina y una acumulacin de presin dentro de la vejiga. Cuando la presin dentro de la vejiga excede la presin que Engineer, manufacturing systems, la orina rebalsa y causa incontinencia, similar al desbordamiento de un dique. 4. Incontinencia total: es la prdida de orina como resultado de la incapacidad de Financial controller orina dentro de la vejiga. DIAGNSTICO  La evaluacin de la causa puede requerir:  Ardelia Mems historia mdica y obsttrica exhaustiva y [place].  Examen fsico completo.  Anlisis de laboratorio como un urocultivo y [place]. Cuando se indican estudios adicionales, pueden incluirse:  Una ecografa.  Radiografas de vejiga y riones.  Una cistoscopa. Consiste en un examen de la vejiga utilizando un telescopio pequeo.  Estudios urodinmicos para comprobar la funcin nerviosa de la vejiga y Social worker. TRATAMIENTO  El tratamiento de la incontinencia urinaria depende de la causa:  Para la incontinencia urinaria causada por una infeccin urinaria, le indicarn  antibiticos. Si la incontinencia urinaria se relaciona con los [employer] toma, el mdico podr Public relations account executive.  Para la incontinencia por estrs, en necesaria una ciruga para restablecer el soporte anatmico de la vejiga o el esfnter, o ambos, lo que Clinical research associate.  Para la incontinencia por rebosamiento causada por una prstata agrandada, una operacin para abrir el canal a travs de la prstata agrandada permitir que el flujo de orina salga de la vejiga. En las mujeres con fibromas, ser necesaria una histerectoma.  Para la incontinencia total, una ciruga del esfnter podr ayudar. Puede ser necesario colocar un esfnter urinario artificial (se coloca un manguito inflable alrededor de Geologist, engineering). En las mujeres que hayan desarrollado un pasaje similar a un orificio entre la vejiga y la vagina (fstula vesicovaginal ), ser necesario realizar una ciruga para cerrar la fstula. INSTRUCCIONES PARA EL CUIDADO EN EL HOGAR  La higiene diaria normal y el uso regular de apsitos o paales para adultos cambiados con regularidad ayudan a prevenir olores y daos en la piel.  Evite la cafena. Puede sobreestimular la vejiga.  Use el bao con regularidad. Trate de ir al bao cada 2  3 horas, aunque no sienta la necesidad. Tmese el tiempo para vaciar la vejiga completamente. Despus de orinar, espere un minuto. Luego trate de orinar nuevamente.  Para las causas que implican una disfuncin nerviosa, lleve un registro de los medicamentos que toma y un diario de las veces que va al bao. SOLICITE ATENCIN MDICA SI:  Despus del procedimiento, experimenta un empeoramiento del dolor en vez de mejorar.  La incontinencia empeora en vez de mejorar. SOLICITE ATENCIN MDICA DE INMEDIATO SI:  Le sube la fiebre o tiene escalofros.  No puede orinar.  Observa irritacin en la ingle o baja hacia los muslos. ASEGRESE DE QUE:   Comprende estas instrucciones.   Controlar su  afeccin.  Recibir ayuda de inmediato si no mejora o si empeora. Document Released: 08/01/2005 Document Revised: 04/03/2013 [facility] Patient Information 2015 [place]. This information is not intended to replace advice given to you by your health care provider. Make sure you discuss any questions you have with your health care provider. Ejercicios de Kegel  Barrister's clerk) El objetivo de los ejercicios de Kegel es aislar y Chief Technology Officer los msculos del suelo plvico. Estos msculos actan como una hamaca que soporta el recto, la vagina, el intestino delgado y [place]. A medida que los msculos se debilitan, se hunden y esos rganos son desplazados de sus posiciones normales. Los ejercicios de Kegel fortalecen los msculos del suelo plvico y ayudan a Garment/textile technologist control de la vejiga y del intestino, mejoran la respuesta sexual y Therapist, music a Proofreader problemas y Dispensing optician durante el [place]. Se pueden hacer en cualquier lugar y en cualquier momento.  [place] LOS EJERCICIOS DE KEGEL   Localice los msculos del suelo plvico. Para ello apriete (contraiga) los msculos que se utilizan para tratar de Scientist, water quality el flujo de [place]. Usted se sentir una opresin en el rea vaginal (mujeres) y que se eleva y comprime la zona rectal (hombres y mujeres).  Para comenzar, contraiga los msculos plvicos durante 2 a 5 segundos y despus reljelos durante 2 a 5 segundos. Esta es una serie. Repita 4 a 5 series con una breve pausa en el medio.  Contraiga los msculos de la pelvis durante 8 a 10 segundos y despus reljelos durante 8 a 10 segundos. Repita 4 a 5 series. Si no puede contraer los msculos de la pelvis durante 8 a 10 segundos, trate de 5 a 7 segundos y Higher education careers adviser 8 a 10 segundos. Su objetivo es Optometrist 4 a 5 series de 10 Passenger transport manager. Mantenga el estmago, las nalgas y las piernas relajadas [place]. Realice ambas series de contracciones, cortas y  largas. [place] posiciones. [place] contracciones 3 a 4 veces por Training and development officer. [place] series mientras est:   5. Acostado en la cama por la [place]. 6. De pie en el almuerzo. 7. Sentado por las tardes. 8. Acostado en la cama por la noche.  Debe hacer 40 a 50 contracciones por da. No realice ms ejercicios de Kegel por da que lo recomendado. El exceso de ejercicio puede causar fatiga muscular. Contine con estos ejercicios durante al menos 15 a 20 semanas o segn las indicaciones de su mdico.  Document Released: 07/18/2012 [facility] Patient Information 2015 [place], [place]. This information is not intended to replace advice given to you by your health care provider. Make sure you discuss any questions you have with your health care provider.

## 2014-08-20 NOTE — Progress Notes (Signed)
Melinda Mckinney 06-30-1956 [id number]   History:    59 y.o.  for annual gyn exam who was referred to our practice courtesy of her PCP Dr. Larose Kells. Patient had seen another provider in 2012 and 2014 whereby she stated that she was diagnosed with CIN-1 and underwent cryotherapy. She also stated that she had been diagnosed with osteoporosis in her native country of [place] several years ago and had a bone density study a few years ago in [place] for which we do not have the report to look at. She is not taking any medication but calcium and vitamin D? Also in 2014 patient had a benign polyp removed from her colon. She's complaining of vaginal irritation especially externally. She stated that a dermatologist had placed her on clobetasol but she does not recall what the diagnosis was for her vulvitis. She states that this has worked well she applies it to 3 times a week. Patient had a normal mammogram December this year. Patient 33 in [place] had a right nephrectomy for severe obstructive nephrolithiasis.  Patient's PCP is treating her for her hypertension, hyperlipidemia, hypothyroidism and fibromyalgia. Patient is morbidly obese with a BMI of 44.26 (242 pounds). Patient reports no past history of hormone replacement therapy.   Past medical history,surgical history, family history and social history were all reviewed and documented in the EPIC chart.  Gynecologic History No LMP recorded. Patient is postmenopausal. Contraception: post menopausal status Last Pap: 2014. Results were: Cervical dysplasia  Last mammogram: 2015. Results were: normal  Obstetric History OB History  Gravida Para Term Preterm AB SAB TAB Ectopic Multiple Living  4 2   2 2    2     # Outcome Date GA Lbr Len/2nd Weight Sex Delivery Anes PTL Lv  4 SAB           3 SAB           2 Para           1 Para                ROS: A ROS was performed and pertinent positives and negatives are included in the  history.  GENERAL: No fevers or chills. HEENT: No change in vision, no earache, sore throat or sinus congestion. NECK: No pain or stiffness. CARDIOVASCULAR: No chest pain or pressure. No palpitations. PULMONARY: No shortness of breath, cough or wheeze. GASTROINTESTINAL: No abdominal pain, nausea, vomiting or diarrhea, melena or bright red blood per rectum. GENITOURINARY: No urinary frequency, urgency, hesitancy or dysuria. MUSCULOSKELETAL: No joint or muscle pain, no back pain, no recent trauma. DERMATOLOGIC: No rash, no itching, no lesions. ENDOCRINE: No polyuria, polydipsia, no heat or cold intolerance. No recent change in weight. HEMATOLOGICAL: No anemia or easy bruising or bleeding. NEUROLOGIC: No headache, seizures, numbness, tingling or weakness. PSYCHIATRIC: No depression, no loss of interest in normal activity or change in sleep pattern.     Exam: chaperone present  BP 128/86 mmHg  Ht 5\' 2"  (1.575 m)  Wt 242 lb (109.77 kg)  BMI 44.25 kg/m2  Body mass index is 44.25 kg/(m^2).  General appearance : Well developed well nourished female. No acute distress HEENT: Neck supple, trachea midline, no carotid bruits, no thyroidmegaly Lungs: Clear to auscultation, no rhonchi or wheezes, or rib retractions  Heart: Regular rate and rhythm, no murmurs or gallops Breast:Examined in sitting and supine position were symmetrical in appearance, no palpable masses or tenderness,  no skin retraction,  no nipple inversion, no nipple discharge, no skin discoloration, no axillary or supraclavicular lymphadenopathy Abdomen: no palpable masses or tenderness, no rebound or guarding Extremities: no edema or skin discoloration or tenderness  Pelvic:  Bartholin, Urethra, Skene Glands: Within normal limits             Vagina: No gross lesions or discharge  Cervix: No gross lesions or discharge  Uterus  anteverted, normal size, shape and consistency, non-tender and mobile  Adnexa  Without masses or  tenderness  Anus and perineum  normal   Rectovaginal  normal sphincter tone without palpated masses or tenderness             Hemoccult colonoscopy one month ago     Assessment/Plan:  59 y.o. female for annual exam who reports having a history of osteoporosis. We have no documentation to evaluate. I have asked the patient to go to the facility and [place] to obtain a copy of that bone density study that was done several years ago and to bring it to the office at time of her bone density study here and then we will sit down and compare and discussed treatment options since she is not on any treatment right now. A Pap smear was done today. Her PCP has been doing her blood work. Patient's vaccines are all up-to-date.  Terrance Mass MD, 3:09 PM 08/20/2014

## 2014-08-22 LAB — CYTOLOGY - PAP

## 2014-09-04 ENCOUNTER — Other Ambulatory Visit: Payer: Self-pay | Admitting: Gynecology

## 2014-09-04 ENCOUNTER — Ambulatory Visit (INDEPENDENT_AMBULATORY_CARE_PROVIDER_SITE_OTHER): Payer: Federal, State, Local not specified - PPO

## 2014-09-04 DIAGNOSIS — M810 Age-related osteoporosis without current pathological fracture: Secondary | ICD-10-CM

## 2014-09-04 DIAGNOSIS — Z8739 Personal history of other diseases of the musculoskeletal system and connective tissue: Secondary | ICD-10-CM

## 2014-09-10 ENCOUNTER — Other Ambulatory Visit: Payer: Self-pay | Admitting: Gynecology

## 2014-09-10 DIAGNOSIS — M810 Age-related osteoporosis without current pathological fracture: Secondary | ICD-10-CM

## 2014-09-12 ENCOUNTER — Other Ambulatory Visit: Payer: Federal, State, Local not specified - PPO

## 2014-09-12 DIAGNOSIS — M810 Age-related osteoporosis without current pathological fracture: Secondary | ICD-10-CM

## 2014-09-13 LAB — VITAMIN D 25 HYDROXY (VIT D DEFICIENCY, FRACTURES): VIT D 25 HYDROXY: 24 ng/mL — AB (ref 30–100)

## 2014-09-15 ENCOUNTER — Other Ambulatory Visit: Payer: Self-pay | Admitting: Gynecology

## 2014-09-15 ENCOUNTER — Encounter: Payer: Self-pay | Admitting: Gynecology

## 2014-09-15 DIAGNOSIS — E559 Vitamin D deficiency, unspecified: Secondary | ICD-10-CM

## 2014-09-15 LAB — PTH, INTACT AND CALCIUM
CALCIUM: 10 mg/dL (ref 8.4–10.5)
PTH: 31 pg/mL (ref 14–64)

## 2014-09-15 MED ORDER — VITAMIN D (ERGOCALCIFEROL) 1.25 MG (50000 UNIT) PO CAPS: 50000.0000 [IU] | ORAL_CAPSULE | ORAL | Status: DC

## 2014-09-16 ENCOUNTER — Encounter: Payer: Self-pay | Admitting: Gynecology

## 2014-09-16 ENCOUNTER — Ambulatory Visit (INDEPENDENT_AMBULATORY_CARE_PROVIDER_SITE_OTHER): Payer: Federal, State, Local not specified - PPO | Admitting: Gynecology

## 2014-09-16 VITALS — BP 118/76

## 2014-09-16 DIAGNOSIS — Z8639 Personal history of other endocrine, nutritional and metabolic disease: Secondary | ICD-10-CM

## 2014-09-16 DIAGNOSIS — M810 Age-related osteoporosis without current pathological fracture: Secondary | ICD-10-CM

## 2014-09-16 MED ORDER — RALOXIFENE HCL 60 MG PO TABS: 60.0000 mg | ORAL_TABLET | Freq: Every day | ORAL | Status: DC

## 2014-09-16 NOTE — Patient Instructions (Signed)
Raloxifene tablets Qu es este medicamento? El RALOXIFENO reduce la cantidad de calcio que los huesos pierden. Se utiliza para tratar y prevenir la osteoporosis en las mujeres que han experimentado la menopausia. Este medicamento puede ser utilizado para otros usos; si tiene alguna pregunta consulte con su proveedor de atencin mdica o con su farmacutico. MARCAS COMERCIALES DISPONIBLES: Evista Qu le debo informar a mi profesional de la salud antes de tomar este medicamento? Necesita saber si usted presenta alguno de los siguientes problemas o situaciones: -antecedentes de cogulos sanguneos -cncer -insuficiencia cardiaca -enfermedad heptica -premenopausia -una reaccin alrgica o inusual al raloxifeno, otros medicamentos, alimentos, colorantes o conservantes -si est embarazada o buscando quedar embarazada -si est amamantando a un beb Cmo debo utilizar este medicamento? Tome este medicamento por va oral con un vaso de agua. Siga las instrucciones de la etiqueta del [place]. Las tabletas se pueden tomar con o sin alimentos. Tome sus dosis a intervalos regulares. No tome su medicamento con una frecuencia mayor a la indicada. Hable con su pediatra para informarse acerca del uso de este medicamento en nios. Puede requerir atencin especial. Sobredosis: Pngase en contacto inmediatamente con un centro toxicolgico o una sala de urgencia si usted cree que haya tomado demasiado medicamento. ATENCIN: [employer] es solo para usted. No comparta este medicamento con nadie. Qu sucede si me olvido de una dosis? Si olvida una dosis, tmela lo antes posible. Si es casi la hora de la prxima dosis, tome slo esa dosis. No tome dosis adicionales o dobles. Qu puede interactuar con este medicamento? -ampicilina -colestiramina -colestipol -diazepam -diazxido -hormonas femininas, como terapia de reemplazo hormonal -lidocana -warfarina Puede ser que esta lista no menciona  todas las posibles interacciones. Informe a su profesional de [employer]	[place] de [employer] productos a base de hierbas, medicamentos de [place] o suplementos nutritivos que est tomando. Si usted fuma, consume bebidas alcohlicas o si utiliza drogas ilegales, indqueselo tambin a su profesional de [employer]	[place]. Algunas sustancias pueden interactuar con su medicamento. A qu debo estar atento al usar Coca[employer]Cola? Visite a su mdico o a su profesional de la salud para chequear su evolucin peridicamente. No suspenda este medicamento excepto si as lo indica su mdico o a su profesional de [employer]	[place]. Mientras est tomando este medicamento debe asegurarse de que cuenta con suficiente calcio y vitamina D en su dieta. Hable de sus necesidades dietticas con su profesional de la salud o con su nutrilogo. El ejercicio puede ayudar a prevenir la prdida sea. Hable de sus necesidades de ejercicio con su mdico o su profesional de [employer]	[place]. En raras ocasiones, este medicamento puede provocar cogulos. Mientras tome este medicamento, debe evitar perodos largos de reposo en cama. Si va a someterse a una operacin, informe a su mdico o a su profesional de la salud que est tomando Coca[employer]Cola. Debe suspenderlo por lo menos 3 das antes de la operacin. Despus de la operacin slo podr Hydrographic surveyor uso de este medicamento cuando haya comenzado a caminar de [place]. No debe volver a comenzar a usarlo mientras contine necesitando perodos prolongados de reposo en cama. No debe fumar mientras est tomando Coca[employer]Cola. Si fuma, puede aumentar el riesgo de formacin de cogulos. El fumar tambin reducir los efectos del [place]. Este medicamento no previene sofocos. Puede causar sofocos en algunos pacientes al comienzo de la terapia. Qu efectos secundarios puedo tener al [employer] este medicamento? Efectos secundarios que debe informar a su mdico o a Barrister's clerk de Technical sales engineer  tan pronto como sea  posible: -cambios de la visin -dolor en el pecho -dificultad al respirar -dolor o hinchazn en las piernas -erupcin cutnea, picazn Efectos secundarios que, por lo general, no requieren atencin mdica (debe informarlos a su mdico o a su profesional de la salud si persisten o si son molestos): -acumulacin de lquido -calambres en las piernas -dolor de Paramedic -sudoracin Puede ser que esta lista no menciona todos los posibles efectos secundarios. Comunquese a su mdico por asesoramiento mdico [employer]. Usted puede informar los efectos secundarios a la FDA por telfono al 1-800-FDA-1088. Dnde debo guardar mi medicina? Mantngala fuera del alcance de los nios. [place]a a [employer], entre 15 y 58 grados C (21 y 54 grados F). Deseche todo el medicamento que no haya utilizado, despus de la fecha de vencimiento. ATENCIN: Este folleto es un resumen. Puede ser que no cubra toda la posible informacin. Si usted tiene preguntas acerca de esta medicina, consulte con su mdico, su farmacutico o su profesional de Technical sales engineer.  2015, Elsevier/Gold Standard. (2008-09-19 16:08:09) Osteoporosis  (Osteoporosis)  A lo largo de la vida, el cuerpo elimina el tejido viejo de los [place] y lo reemplaza por tejido nuevo. A medida que se envejece, el cuerpo no puede reponerlo tan rpidamente como lo elimina. [employer] 30 aos, la mayora de las personas comienza a perder poco a poco el tejido de los huesos debido al desequilibrio entre la prdida y la reposicin. Algunas personas pierden ms tejido [employer]. La prdida del tejido del hueso ms all de un grado normal se considera osteoporosis.  La osteoporosis afecta la resistencia y la durabilidad de los [place]. El interior de los extremos de los huesos y los huesos planos, como los huesos de la pelvis, se parecen a un panal porque estn llenos de pequeos espacios abiertos. Al perder tejido los huesos se vuelven  menos densos. Esto significa que los espacios abiertos se hacen ms grandes y las paredes entre estos espacios se vuelven ms delgadas. Como consecuencia, los huesos se vuelven ms dbiles. Los huesos de una persona que sufre osteoporosis llegan a ser tan dbiles que pueden romperse (fractura) en un accidente leve, como una simple cada.  CAUSAS  Los siguientes factores han sido asociados con el desarrollo de la osteoporosis.   El hbito de fumar.  Beber ms de 2 medidas de bebidas [employer] a la [place].  Uso de ciertos medicamentos durante un tiempo prolongado.  Corticoides.  Medicamentos para la quimioterapia.  Medicamentos para la tiroides.  Medicamentos antiepilpticos.  Medicamentos de supresin gonadal.  Medicamentos inmunosupresores.  Tener bajo peso.  Falta de actividad fsica.  Falta de exposicin al sol. Esto puede ser la causa del dficit de vitamina D.  Ciertas enfermedades crnicas.  Ciertas enfermedades inflamatorias del intestino, como la enfermedad de Crohn y la colitis ulcerosa.  Diabetes.  Hipertiroidismo.  Hiperparatiroidismo. [place] desarrollar osteoporosis. Sin embargo, los siguientes factores pueden aumentar el riesgo de Actor osteoporosis.   [place] - La mujeres tienen ms [employer].  Edad - Tener ms de 51 aos aumenta el riesgo.  [place] y asitica tienen ms riesgo.  El peso - Tener un peso extremadamente bajo puede aumentar el riesgo de osteoporosis.  Historia familiar de osteoporosis - Tener un miembro en la familia que haya sufrido osteoporosis hace que aumente el [place]. [place] que sufren osteoporosis no  tienen sntomas.  DIAGNSTICO  Algunos signos hallados durante un examen fsico que pueden hacer sospechar al mdico que sufre osteoporosis son:   Disminucin de Agricultural consultant. La causa en general es la compresin  de los huesos que forman la columna vertebral (vrtebras), que se han debilitado y se han fracturado.  Una curva o redondeo de la espalda (cifosis). Para confirmar los signos de osteoporosis, el mdico puede solicitar un procedimiento que [place] 2 haces de rayos X en dosis bajas, con diferentes niveles de energa para medir la densidad mineral sea (absorciometra de rayos X de energa dual [DXA]). Adems, el mdico controlar su nivel de vitamina D.  TRATAMIENTO  El objetivo del tratamiento de la osteoporosis es el fortalecimiento de los huesos con el fin de disminuir el riesgo de fracturas. Hay diferentes tipos de medicamentos disponibles para ayudar a Geneticist, molecular. Algunos de estos medicamentos actan reduciendo [employer] de prdida sea. Otros medicamentos funcionan aumentando la densidad sea. El tratamiento tambin consiste en asegurarse de que sus niveles de calcio y vitamina D son adecuados.  PREVENCIN  Hay cosas que usted puede hacer para prevenir la osteoporosis. Un consumo adecuado de calcio y vitamina D puede ayudar a Scientist, forensic una ptima densidad mineral sea. El ejercicio regular tambin puede ayudar, sobre todo la resistencia y actividades de alto impacto. Si usted fuma, dejar de fumar es una parte importante de la prevencin de la osteoporosis.  ASEGRESE DE QUE:   Comprende estas instrucciones.  Controlar su enfermedad.  Solicitar ayuda de inmediato si no mejora o si empeora. PARA OBTENER MS INFORMACIN  www.osteo.org and EquipmentWeekly.com.ee  Document Released: 05/11/2005 Document Revised: 11/26/2012 [facility] Patient Information 2015 [place], [place]. This information is not intended to replace advice given to you by your health care provider. Make sure you discuss any questions you have with your health care provider.

## 2014-09-16 NOTE — Progress Notes (Signed)
   Patient is a 59 year old who was seen for the first time as a new patient to the practice on 08/20/2013. Please see previous note for detail on her history. Patient in [place] had been diagnosed with osteoporosis but patient does not recall ever having received any treatment. She did bring with her today a report to compare with the bone density study done here in our office a few days ago. This was done with a GE Lunar densitometry or in the findings from October 2007 as follows:  Left hip T score 0.3 Right hip not analyzed AP spine T score -2.6  Patient stated if you years ago she had another bone density study and [place] which we have no report. Her bone density study done here in our office at [facility] gynecology with Hologic bone densitometry had the following findingsy: (09/10/2014)  Left hip T score -1.1 Right hip T score -1.2 AP spine T score -2.8  Reason calcium and PTH levels were normal. Vitamin D level was low at 24.  Assessment/plan: #1 osteoporosis with more than 25% of bone mass below normal at the AP spine and 10-15% below normal bone mass of the right and left hip. We discussed that her risk of a spinal fracture is a times greater than the general population and her risk of hip fracture was 2.3 times greater than the general population. We discussed different treatment options and since she has achalasia and GERD we are going to start her on Evista 60 mg 1 by mouth daily. Risk benefits and pros and cons to include DVT and PE were discussed. We will repeat a bone density study in one year to monitor response to treatment. #2 vitamin D deficiency patient will be started on vitamin D 50,000 units every weekly for 12 weeks. She will then return to the office for vitamin D level. She will then be started on vitamin D3 2000 units daily thereafter. #3 patient with history of herniated disc diagnosed in [place] we will obtain MRI reports when she goes visit  her country in the next few weeks and bring with her so that she can make an appointment to see the neurosurgeon here in [place] because of her constant back pain.

## 2014-09-30 ENCOUNTER — Other Ambulatory Visit: Payer: Self-pay | Admitting: Internal Medicine

## 2014-11-19 ENCOUNTER — Other Ambulatory Visit: Payer: Self-pay

## 2014-12-09 ENCOUNTER — Other Ambulatory Visit: Payer: Self-pay | Admitting: Gynecology

## 2014-12-10 ENCOUNTER — Other Ambulatory Visit: Payer: Federal, State, Local not specified - PPO

## 2014-12-10 DIAGNOSIS — E559 Vitamin D deficiency, unspecified: Secondary | ICD-10-CM

## 2014-12-11 ENCOUNTER — Telehealth: Payer: Self-pay

## 2014-12-11 ENCOUNTER — Other Ambulatory Visit: Payer: Self-pay | Admitting: Gynecology

## 2014-12-11 DIAGNOSIS — K909 Intestinal malabsorption, unspecified: Secondary | ICD-10-CM

## 2014-12-11 DIAGNOSIS — E559 Vitamin D deficiency, unspecified: Secondary | ICD-10-CM

## 2014-12-11 LAB — VITAMIN D 25 HYDROXY (VIT D DEFICIENCY, FRACTURES): Vit D, 25-Hydroxy: 16 ng/mL — ABNORMAL LOW (ref 30–100)

## 2014-12-11 MED ORDER — VITAMIN D (ERGOCALCIFEROL) 1.25 MG (50000 UNIT) PO CAPS: 50000.0000 [IU] | ORAL_CAPSULE | ORAL | Status: DC

## 2014-12-11 NOTE — Telephone Encounter (Signed)
Encounter not needed. Just routed lab result note with comment.

## 2014-12-12 ENCOUNTER — Other Ambulatory Visit: Payer: Federal, State, Local not specified - PPO

## 2014-12-12 DIAGNOSIS — K909 Intestinal malabsorption, unspecified: Secondary | ICD-10-CM

## 2014-12-12 DIAGNOSIS — E559 Vitamin D deficiency, unspecified: Secondary | ICD-10-CM

## 2014-12-13 LAB — VITAMIN D 25 HYDROXY (VIT D DEFICIENCY, FRACTURES): VIT D 25 HYDROXY: 17 ng/mL — AB (ref 30–100)

## 2014-12-15 LAB — GLIA (IGA/G) + TTG IGA
Gliadin IgA: 11 Units (ref <20)
Gliadin IgG: 1 Units (ref <20)
TISSUE TRANSGLUTAMINASE AB, IGA: 1 U/mL (ref <4)

## 2014-12-16 ENCOUNTER — Telehealth: Payer: Self-pay | Admitting: *Deleted

## 2014-12-16 DIAGNOSIS — E559 Vitamin D deficiency, unspecified: Secondary | ICD-10-CM

## 2014-12-16 DIAGNOSIS — K900 Celiac disease: Secondary | ICD-10-CM

## 2014-12-16 NOTE — Telephone Encounter (Signed)
-----   Message from Terrance Mass, MD sent at 12/16/2014 12:58 PM EDT ----- Please make an appointment for this patient to see Dr. Renne Crigler endocrinologist on this patient with persistent vitamin D deficiency despite taking 50,000 units every weekly and normal celiac sprue panel. Please inform patient that I would like to refer her to the endocrinologist for further evaluation. She also has history of osteoporosis.

## 2014-12-16 NOTE — Telephone Encounter (Signed)
Referral placed they will contact pt to schedule. 

## 2014-12-17 ENCOUNTER — Other Ambulatory Visit: Payer: Self-pay

## 2014-12-17 NOTE — Telephone Encounter (Signed)
Appointment 01/19/15 @ 1:15pm

## 2014-12-23 ENCOUNTER — Ambulatory Visit (INDEPENDENT_AMBULATORY_CARE_PROVIDER_SITE_OTHER): Payer: Federal, State, Local not specified - PPO | Admitting: Internal Medicine

## 2014-12-23 ENCOUNTER — Encounter: Payer: Self-pay | Admitting: Internal Medicine

## 2014-12-23 VITALS — BP 124/78 | HR 73 | Temp 98.0°F | Ht 62.0 in | Wt 246.1 lb

## 2014-12-23 DIAGNOSIS — E039 Hypothyroidism, unspecified: Secondary | ICD-10-CM

## 2014-12-23 DIAGNOSIS — R7309 Other abnormal glucose: Secondary | ICD-10-CM | POA: Diagnosis not present

## 2014-12-23 DIAGNOSIS — I10 Essential (primary) hypertension: Secondary | ICD-10-CM

## 2014-12-23 DIAGNOSIS — E785 Hyperlipidemia, unspecified: Secondary | ICD-10-CM | POA: Diagnosis not present

## 2014-12-23 DIAGNOSIS — E7800 Pure hypercholesterolemia, unspecified: Secondary | ICD-10-CM

## 2014-12-23 DIAGNOSIS — R7303 Prediabetes: Secondary | ICD-10-CM

## 2014-12-23 LAB — LIPID PANEL
CHOLESTEROL: 208 mg/dL — AB (ref 0–200)
HDL: 68.3 mg/dL (ref >39.00)
NonHDL: 139.7
Total CHOL/HDL Ratio: 3
Triglycerides: 204 mg/dL — ABNORMAL HIGH (ref 0.0–149.0)
VLDL: 40.8 mg/dL — ABNORMAL HIGH (ref 0.0–40.0)

## 2014-12-23 LAB — LDL CHOLESTEROL, DIRECT: Direct LDL: 115 mg/dL

## 2014-12-23 LAB — BASIC METABOLIC PANEL
BUN: 18 mg/dL (ref 6–23)
CALCIUM: 10.1 mg/dL (ref 8.4–10.5)
CO2: 30 mEq/L (ref 19–32)
Chloride: 102 mEq/L (ref 96–112)
Creatinine, Ser: 0.81 mg/dL (ref 0.40–1.20)
GFR: 76.36 mL/min (ref >60.00)
GLUCOSE: 95 mg/dL (ref 70–99)
POTASSIUM: 4 meq/L (ref 3.5–5.1)
SODIUM: 139 meq/L (ref 135–145)

## 2014-12-23 LAB — HEMOGLOBIN A1C: Hgb A1c MFr Bld: 5.9 % (ref 4.6–6.5)

## 2014-12-23 LAB — ALT: ALT: 14 U/L (ref 0–35)

## 2014-12-23 LAB — AST: AST: 15 U/L (ref 0–37)

## 2014-12-23 LAB — TSH: TSH: 4.76 u[IU]/mL — AB (ref 0.35–4.50)

## 2014-12-23 MED ORDER — LEVOTHYROXINE SODIUM 100 MCG PO TABS: 100.0000 ug | ORAL_TABLET | Freq: Every day | ORAL | Status: DC

## 2014-12-23 MED ORDER — LISINOPRIL 10 MG PO TABS: 10.0000 mg | ORAL_TABLET | Freq: Every day | ORAL | Status: DC

## 2014-12-23 MED ORDER — DULOXETINE HCL 60 MG PO CPEP: 60.0000 mg | ORAL_CAPSULE | Freq: Every day | ORAL | Status: DC

## 2014-12-23 MED ORDER — HYDROCHLOROTHIAZIDE 50 MG PO TABS: 50.0000 mg | ORAL_TABLET | Freq: Every day | ORAL | Status: DC

## 2014-12-23 MED ORDER — LOVASTATIN 40 MG PO TABS: 80.0000 mg | ORAL_TABLET | Freq: Every day | ORAL | Status: DC

## 2014-12-23 MED ORDER — OMEPRAZOLE 20 MG PO CPDR: 20.0000 mg | DELAYED_RELEASE_CAPSULE | Freq: Every day | ORAL | Status: DC

## 2014-12-23 MED ORDER — CANDESARTAN CILEXETIL 32 MG PO TABS: 32.0000 mg | ORAL_TABLET | Freq: Every day | ORAL | Status: DC

## 2014-12-23 MED ORDER — METFORMIN HCL 500 MG PO TABS: 500.0000 mg | ORAL_TABLET | Freq: Two times a day (BID) | ORAL | Status: DC

## 2014-12-23 MED ORDER — ALLOPURINOL 100 MG PO TABS: 100.0000 mg | ORAL_TABLET | Freq: Every day | ORAL | Status: DC

## 2014-12-23 NOTE — Progress Notes (Signed)
Pre visit review using our clinic review tool, if applicable. No additional management support is needed unless otherwise documented below in the visit note. 

## 2014-12-23 NOTE — Assessment & Plan Note (Signed)
based on last cholesterol panel medication dose was increased, good compliance and tolerance,checkFLP,AST,ALT

## 2014-12-23 NOTE — Patient Instructions (Signed)
Get your blood work before you leave     Come back to the office 6 months   for a physical exam  Please schedule an appointment at the front desk    Come back fasting

## 2014-12-23 NOTE — Progress Notes (Signed)
Subjective:    Patient ID: Melinda Mckinney, female    DOB: 02/10/1956, 59 y.o.   MRN: [id number]  DOS:  12/23/2014 Type of visit - description : rov Interval history: Diabetes, good compliance of medication, reports "good ambulatory blood sugars" but could not tell me any readings Hypertension, good compliance of medication, reports good ambulatory BPs but could not tell me the readings. High cholesterol, basal last FLP,  cholesterol med dose was increased, good compliance, she has fibromyalgia, symptoms are at baseline. Does report back pain, gynecology already referred  her to see neurosurgery.   Review of Systems  Denies chest pain or difficulty breathing No nausea, vomiting, diarrhea No recent episodes consistent with gout  Past Medical History  Diagnosis Date  . Hypertension   . GERD (gastroesophageal reflux disease)   . Diabetes mellitus   . High cholesterol   . Thyroid disease   . Anxiety and depression   . Fibromyalgia   . Abnormal Pap smear of cervix   . S/p nephrectomy   . CAD (coronary artery disease) ~2009    stent   . Osteoporosis   . Insomnia 09/27/2013  . Cervical dysplasia   . Vitamin D deficiency     Past Surgical History  Procedure Laterality Date  . Nephrectomy Right 1991    in [place], due to fibrosis and lithiasis  . Esophagomyomectomy (heller)  2009  . Hernia repair      x 6 surgeries   . Cesarean section      x 3   . Knee arthroscopy Right   . Gynecologic cryosurgery      X2    History   Social History  . Marital Status: Married    Spouse Name: N/A  . Number of Children: 3  . Years of Education: N/A   Occupational History  . stay home     Social History Main Topics  . Smoking status: Former Research scientist (life sciences)  . Smokeless tobacco: Not on file     Comment: quit ~ 2000 (2 ppd)  . Alcohol Use: 0.0 oz/week    0 Standard drinks or equivalent per week     Comment: socially   . Drug Use: No  . Sexual Activity: No     Comment: 1ST INTERCOURSE-  16, PARTNERS - 2   Other Topics Concern  . Not on file   Social History Narrative   Original from [place]   3 children, 2 alive   Household --pt and husband         Medication List       This list is accurate as of: 12/23/14  5:57 PM.  Always use your most recent med list.               allopurinol 100 MG tablet  Commonly known as:  ZYLOPRIM  Take 1 tablet (100 mg total) by mouth daily.     aspirin 81 MG tablet  Take 81 mg by mouth daily.     candesartan 32 MG tablet  Commonly known as:  ATACAND  Take 1 tablet (32 mg total) by mouth daily.     cyclobenzaprine 10 MG tablet  Commonly known as:  FLEXERIL  Take 1 tablet (10 mg total) by mouth 2 (two) times daily as needed for muscle spasms.     DULoxetine 60 MG capsule  Commonly known as:  CYMBALTA  Take 1 capsule (60 mg total) by mouth daily.     hydrochlorothiazide 50 MG tablet  Commonly known as:  HYDRODIURIL  Take 1 tablet (50 mg total) by mouth daily.     levothyroxine 100 MCG tablet  Commonly known as:  SYNTHROID, LEVOTHROID  Take 1 tablet (100 mcg total) by mouth daily.     lisinopril 10 MG tablet  Commonly known as:  PRINIVIL,ZESTRIL  Take 1 tablet (10 mg total) by mouth daily.     lovastatin 40 MG tablet  Commonly known as:  MEVACOR  Take 2 tablets (80 mg total) by mouth daily.     metFORMIN 500 MG tablet  Commonly known as:  GLUCOPHAGE  Take 1 tablet (500 mg total) by mouth 2 (two) times daily with a meal.     nystatin-triamcinolone ointment  Commonly known as:  MYCOLOG  Apply 1 application topically 2 (two) times daily.     omeprazole 20 MG capsule  Commonly known as:  PRILOSEC  Take 1 capsule (20 mg total) by mouth daily.     raloxifene 60 MG tablet  Commonly known as:  EVISTA  Take 1 tablet (60 mg total) by mouth daily.     Vitamin D (Ergocalciferol) 50000 UNITS Caps capsule  Commonly known as:  DRISDOL  Take 1 capsule (50,000 Units total) by mouth every 7 (seven) days.      zolpidem 5 MG tablet  Commonly known as:  AMBIEN  Take 1 tablet (5 mg total) by mouth at bedtime as needed for sleep.           Objective:   Physical Exam BP 124/78 mmHg  Pulse 73  Temp(Src) 98 F (36.7 C) (Oral)  Ht 5\' 2"  (1.575 m)  Wt 246 lb 2 oz (111.642 kg)  BMI 45.01 kg/m2  SpO2 97%  General:   Well developed, well nourished . NAD, well appearing.  HEENT:  Normocephalic . Face symmetric, atraumatic Lungs:  CTA B Normal respiratory effort, no intercostal retractions, no accessory muscle use. Heart: RRR,  no murmur.  No pretibial edema bilaterally  Skin: Not pale. Not jaundice Neurologic:  alert & oriented X3.  Speech normal, gait  Unassisted and slt limited by leg pain (at baseline) Psych--  Cognition and judgment appear intact.  Cooperative with normal attention span and concentration.  Behavior appropriate. No anxious or depressed appearing.       Assessment & Plan:     hypertension, well-controlled, refill medications, check a BMP   Diabetes,refill metformin, checkA1c  Hypothyroidism,onSynthroid,refill meds,check a TSH  Vitamin D deficiency, not responding to medication, gynecology refer her to Dr. Karmen Stabs.  Gout, no recent episodes, refill allopurinol.  Osteoporosis, last bone density per gynecology

## 2014-12-25 MED ORDER — ATORVASTATIN CALCIUM 40 MG PO TABS: 40.0000 mg | ORAL_TABLET | Freq: Every day | ORAL | Status: DC

## 2014-12-25 NOTE — Addendum Note (Signed)
Addended by: Wilfrid Lund on: 12/25/2014 02:24 PM   Modules accepted: Orders, Medications

## 2014-12-25 NOTE — Addendum Note (Signed)
Addended by: Wilfrid Lund on: 12/25/2014 02:26 PM   Modules accepted: Orders

## 2015-01-01 ENCOUNTER — Other Ambulatory Visit: Payer: Self-pay

## 2015-01-19 ENCOUNTER — Ambulatory Visit (INDEPENDENT_AMBULATORY_CARE_PROVIDER_SITE_OTHER): Payer: Federal, State, Local not specified - PPO | Admitting: Internal Medicine

## 2015-01-19 ENCOUNTER — Encounter: Payer: Self-pay | Admitting: Internal Medicine

## 2015-01-19 VITALS — BP 110/62 | HR 87 | Temp 98.3°F | Resp 12 | Ht 63.0 in | Wt 247.0 lb

## 2015-01-19 DIAGNOSIS — E559 Vitamin D deficiency, unspecified: Secondary | ICD-10-CM

## 2015-01-19 DIAGNOSIS — M810 Age-related osteoporosis without current pathological fracture: Secondary | ICD-10-CM | POA: Diagnosis not present

## 2015-01-19 DIAGNOSIS — E039 Hypothyroidism, unspecified: Secondary | ICD-10-CM

## 2015-01-19 LAB — VITAMIN D 25 HYDROXY (VIT D DEFICIENCY, FRACTURES): VITD: 26.02 ng/mL — ABNORMAL LOW (ref 30.00–100.00)

## 2015-01-19 NOTE — Progress Notes (Signed)
Patient ID: Melinda Mckinney, female   DOB: 1955-12-22, 59 y.o.   MRN: [id number]   HPI  Melinda Mckinney is a 59 y.o.-year-old female, referred by her PCP, Dr. Larose Kells , in consultation for vitamin D deficiency and hypothyroidism.  Pt was dx with vitamin D def in 08/2014. No h/o hypocalcemia.  I reviewed pt's pertinent labs: Component     Latest Ref Rng 09/12/2014 12/10/2014 12/12/2014  Vit D, 25-Hydroxy     30 - 100 ng/mL 24 (L) 16 (L) 17 (L)   Pt was started on Ergocalciferol 50,000 units weekly in 09/2014. She is on vit D 5000 IU daily.  She did not miss doses of the vit D.   A celiac panel was negative on 12/12/2014. She has abdominal pain, bloating, nausea and sometimes vomiting. She was seen by GI >> EGD >> had Sx for Acalasia  Lab Results  Component Value Date   PTH 31 09/12/2014   CALCIUM 10.1 12/23/2014   CALCIUM 10.0 09/12/2014   CALCIUM 9.6 04/16/2014   CALCIUM 9.8 08/12/2013   CALCIUM 10.1 07/30/2012   CALCIUM 9.6 08/18/2011   CALCIUM 10.0 09/30/2008   CALCIUM 9.5 02/14/2008   She also has a h/o osteoporosis. I reviewed pt's DEXA scans: Date L1-L4 T score FN T score  09/04/2014 (Hologic)  -2.8 LFN: -1.1 RFN: -1.2  03/25/2007 (Lunar)  -2.6   Not on med for OP.  No fractures or falls.   No hand cramping or perioral numbness.  + h/o kidney stones. R nephrectomy for hydronephrosis 2/2 kidney stone.  At that time, she also had surgery for incarcerated hernia - had several hernias  No h/o malabsorption, celiac disease, or h/o GBP.   Pt is on calcium and vitamin D; she also eats dairy and green, leafy, vegetables.  No h/o CKD. Last BUN/Cr: Lab Results  Component Value Date   BUN 18 12/23/2014   CREATININE 0.81 12/23/2014   She has hypothyroidism: - on LT4 100 mcg - ina am - with water - eats > 30 min after - + PPI! - + MVI! - no calcium, iron  Last TSH: Lab Results  Component Value Date   TSH 4.76* 12/23/2014   She had pbs swallowing with fod, + occasional  hoarseness. + SOB when lying down.  + FH with thyroid ds: Mother, daughter, and son. No ThyCa.   I reviewed her chart and she also has a history of prior nephrectomy, OA.   ROS: Constitutional: no weight gain/loss, + fatigue, + hot flashes, + poor sleep, + nocturia Eyes: + blurry vision, no xerophthalmia ENT: + sore throat, no nodules palpated in throat, + dysphagia/no odynophagia, no hoarseness,+ tinnitus Cardiovascular: no CP/+ SOB/no palpitations/+ leg swelling Respiratory: + Both: cough/SOB Gastrointestinal: + all: N/V/D/heartburn Musculoskeletal: + all: muscle/joint aches Skin: +: Rash, easy bruising, itching Neurological: no tremors/numbness/tingling/dizziness, + HA Psychiatric: + depression/no anxiety  Past Medical History  Diagnosis Date  . Hypertension   . GERD (gastroesophageal reflux disease)   . Diabetes mellitus   . High cholesterol   . Thyroid disease   . Anxiety and depression   . Fibromyalgia   . Abnormal Pap smear of cervix   . S/p nephrectomy   . CAD (coronary artery disease) ~2009    stent   . Osteoporosis   . Insomnia 09/27/2013  . Cervical dysplasia   . Vitamin D deficiency    Past Surgical History  Procedure Laterality Date  . Nephrectomy Right 1991    in [place], due to fibrosis and lithiasis  . Esophagomyomectomy (heller)  2009  . Hernia repair      x 6 surgeries   . Cesarean section      x 3   . Knee arthroscopy Right   . Gynecologic cryosurgery      X2   History   Social History  . Marital Status: Married    Spouse Name: N/A  . Number of Children: 3  . Years of Education: N/A   Occupational History  . stay home     Social History Main Topics  . Smoking status: Former Research scientist (life sciences)  . Smokeless tobacco: Not on file     Comment: quit ~ 2000 (2 ppd)  . Alcohol Use: 0.0 oz/week    0 Standard drinks or equivalent per week     Comment: socially   . Drug Use: No  . Sexual Activity: No     Comment: 1ST INTERCOURSE- 16, PARTNERS - 2    Other Topics Concern  . Not on file   Social History Narrative   Original from [place]   3 children, 2 alive   Household --pt and husband    Current Outpatient Prescriptions on File Prior to Visit  Medication Sig Dispense Refill  . allopurinol (ZYLOPRIM) 100 MG tablet Take 1 tablet (100 mg total) by mouth daily. 90 tablet 2  . aspirin 81 MG tablet Take 81 mg by mouth daily.    Marland Kitchen atorvastatin (LIPITOR) 40 MG tablet Take 1 tablet (40 mg total) by mouth at bedtime. 30 tablet 3  . candesartan (ATACAND) 32 MG tablet Take 1 tablet (32 mg total) by mouth daily. 90 tablet 2  . cyclobenzaprine (FLEXERIL) 10 MG tablet Take 1 tablet (10 mg total) by mouth 2 (two) times daily as needed for muscle spasms. 40 tablet 2  . DULoxetine (CYMBALTA) 60 MG capsule Take 1 capsule (60 mg total) by mouth daily. 90 capsule 2  . hydrochlorothiazide (HYDRODIURIL) 50 MG tablet Take 1 tablet (50 mg total) by mouth daily. 90 tablet 2  . levothyroxine (SYNTHROID, LEVOTHROID) 100 MCG tablet Take 1 tablet (100 mcg total) by mouth daily. 90 tablet 2  . lisinopril (PRINIVIL,ZESTRIL) 10 MG tablet Take 1 tablet (10 mg total) by mouth daily. 90 tablet 2  . metFORMIN (GLUCOPHAGE) 500 MG tablet Take 1 tablet (500 mg total) by mouth 2 (two) times daily with a meal. 180 tablet 2  . nystatin-triamcinolone ointment (MYCOLOG) Apply 1 application topically 2 (two) times daily. 30 g 3  . omeprazole (PRILOSEC) 20 MG capsule Take 1 capsule (20 mg total) by mouth daily. 90 capsule 2  . Vitamin D, Ergocalciferol, (DRISDOL) 50000 UNITS CAPS capsule Take 1 capsule (50,000 Units total) by mouth every 7 (seven) days. 12 capsule 0  . raloxifene (EVISTA) 60 MG tablet Take 1 tablet (60 mg total) by mouth daily. (Patient not taking: Reported on 01/19/2015) 30 tablet 11  . zolpidem (AMBIEN) 5 MG tablet Take 1 tablet (5 mg total) by mouth at bedtime as needed for sleep. (Patient not taking: Reported on 01/19/2015) 90 tablet 1   No current  facility-administered medications on file prior to visit.   Allergies  Allergen Reactions  . Penicillins    Family History  Problem Relation Age of Onset  . Colon cancer Neg Hx   . Breast cancer Other     aunt   . CAD Brother   . Lung cancer Mother     F and M  .  Diabetes Father     F and mother    PE: BP 110/62 mmHg  Pulse 87  Temp(Src) 98.3 F (36.8 C) (Oral)  Resp 12  Ht 5\' 3"  (1.6 m)  Wt 247 lb (112.038 kg)  BMI 43.76 kg/m2  SpO2 95% Wt Readings from Last 3 Encounters:  12/23/14 246 lb 2 oz (111.642 kg)  08/20/14 242 lb (109.77 kg)  07/24/14 241 lb 8 oz (109.544 kg)   Constitutional: overweight, in NAD.  Eyes: PERRLA, EOMI, no exophthalmos ENT: moist mucous membranes, no thyromegaly, no cervical lymphadenopathy Cardiovascular: RRR, No MRG Respiratory: CTA B Gastrointestinal: abdomen soft, NT, ND, BS+ Musculoskeletal: no deformities, strength intact in all 4; Chvostek sign negative. Skin: moist, warm, no rashes Neurological: no tremor with outstretched hands, DTR delayed in all 4  Assessment: 1. Vitamin D deficiency  2. Hypothyroidism  PLAN: 1. Patient had a low vitamin D in 09/12/2014, at 24, however, this decreased further despite starting ergocalciferol 50,000 units once a week. I am not sure why this happened. I discussed with the patient, her husband, and the interpreter, that she likely has a degree of malabsorption, and we would possibly need to increase her vitamin D dose even further. I advised her that we can go much higher than the dose that she is now on, if needed. There is no apparent reason for malabsorption: Celiac workup negative, however, I do not see results of duodenal biopsies in her most recent EGD scanned in Epic. We discussed that she may need to have this done, if she has not already, since she has periodic EGD's checked for history of Barrett's esophagus. She also had achalasia surgery. I doubt that she has significant constriction of the  lower esophageal sphincter not to allow the vitamin D pills to go through. We did discuss, however, that we may need vitamin D lingual drops if we cannot increase her blood level with po capsules and tablets. There is no intramuscular form of vitamin D available in [place]. - No apparent signs/sxs from hypocalcemia: no perioral numbness, no acral cramping; Chvostek sign negative - Today I will check: Vitamin D 25-hydroxy vitamin D 1,25 dihydroxy - continue her current supplementation with:   Ergocalciferol 50,000 units weekly   Vitamin D 5000 units daily (patient and husband will verify the accuracy of the dose and let me know)   Vitamin D in MVI once a day  - If we end up change the dose of ergocalciferol, we will need to repeat her vitamin D level in 2 months. - I will see the patient back in 6 months  2. Hypothyroidism - Patient would also want me to manage this - Latest TSH was slightly elevated, probably due to the fact that patient takes her multivitamins and PPI along with levothyroxine the morning.  - I advised her to take the thyroid hormone every day, with water, >30 minutes before breakfast, separated by >4 hours from acid reflux medications, calcium, iron, multivitamins. - we will recheck her TFTs in 2 months  CC: Dr. Uvaldo Rising  Office Visit on 01/19/2015  Component Date Value Ref Range Status  . VITD 01/19/2015 26.02* 30.00 - 100.00 ng/mL Final  . Vitamin D 1, 25 (OH)2 Total 01/19/2015 53  18 - 72 pg/mL Final  . Vitamin D3 1, 25 (OH)2 01/19/2015 40   Final  . Vitamin D2 1, 25 (OH)2 01/19/2015 13   Final   Comment: Vitamin D3, 1,25(OH)2 indicates both endogenous production and supplementation.  Vitamin  D2, 1,25(OH)2 is an indicator of exogeous sources, such as diet or supplementation.  Interpretation and therapy are based on measurement of Vitamin D,1,25(OH)2, Total. This test was developed and its analytical performance characteristics have been determined by [facility], [place], [place]. It has not been cleared or approved by the FDA. This assay has been validated pursuant to the CLIA regulations and is used for clinical purposes.    Vit D close to normal. Continue current dosing and will repeat vit D in 2 months, when she comes back for TFTs. Active vitamin D is normal.

## 2015-01-19 NOTE — Patient Instructions (Signed)
Please continue the current Ergocalciferol dose: 50,000 units once a week and the Over the counter vitamin D 5000 units daily.  Take the thyroid hormone every day, with water, >30 minutes before breakfast, separated by >4 hours from acid reflux medications, calcium, iron, multivitamins.  Please stop at the lab.  Please return in 6 months for a visit, but in 2 months for labs.

## 2015-01-22 ENCOUNTER — Other Ambulatory Visit: Payer: Self-pay | Admitting: *Deleted

## 2015-01-22 LAB — VITAMIN D 1,25 DIHYDROXY
VITAMIN D 1, 25 (OH) TOTAL: 53 pg/mL (ref 18–72)
Vitamin D2 1, 25 (OH)2: 13 pg/mL
Vitamin D3 1, 25 (OH)2: 40 pg/mL

## 2015-01-26 ENCOUNTER — Encounter: Payer: Self-pay | Admitting: *Deleted

## 2015-02-05 ENCOUNTER — Ambulatory Visit (INDEPENDENT_AMBULATORY_CARE_PROVIDER_SITE_OTHER): Payer: Federal, State, Local not specified - PPO | Admitting: Internal Medicine

## 2015-02-05 ENCOUNTER — Encounter: Payer: Self-pay | Admitting: Internal Medicine

## 2015-02-05 VITALS — BP 130/78 | HR 92 | Temp 98.2°F | Ht 63.0 in | Wt 245.4 lb

## 2015-02-05 DIAGNOSIS — E785 Hyperlipidemia, unspecified: Secondary | ICD-10-CM | POA: Diagnosis not present

## 2015-02-05 DIAGNOSIS — E039 Hypothyroidism, unspecified: Secondary | ICD-10-CM

## 2015-02-05 DIAGNOSIS — M545 Low back pain, unspecified: Secondary | ICD-10-CM

## 2015-02-05 DIAGNOSIS — M549 Dorsalgia, unspecified: Secondary | ICD-10-CM | POA: Insufficient documentation

## 2015-02-05 DIAGNOSIS — I10 Essential (primary) hypertension: Secondary | ICD-10-CM | POA: Diagnosis not present

## 2015-02-05 DIAGNOSIS — E7800 Pure hypercholesterolemia, unspecified: Secondary | ICD-10-CM

## 2015-02-05 NOTE — Progress Notes (Signed)
Subjective:    Patient ID: Melinda Mckinney, female    DOB: March 28, 1956, 59 y.o.   MRN: [id number]  DOS:  02/05/2015 Type of visit - description : rov, several concerns Interval history: Long history of back pain, 2 years ago was evaluated in [place] and was recommend surgery. The pain is well localized at the L2-3 area without radiation High cholesterol, based on last cholesterol panel,  we change her medications, good compliance, no apparent side effects. Hypothyroidism, good compliance of medication Hypertension, was recommended to discontinue Atacand by endo (per pt), BP remains well controlled   Review of Systems  Denies chest pain, difficulty breathing Had a significant cold, cough has improved, had nausea vomiting (GI sx resolved) Denies any bladder or bowel incontinence, occasionally has a ill-defined lower extremity paresthesias   Past Medical History  Diagnosis Date  . Hypertension   . GERD (gastroesophageal reflux disease)   . Diabetes mellitus   . High cholesterol   . Thyroid disease   . Anxiety and depression   . Fibromyalgia   . Abnormal Pap smear of cervix   . S/p nephrectomy   . CAD (coronary artery disease) ~2009    stent   . Osteoporosis   . Insomnia 09/27/2013  . Cervical dysplasia   . Vitamin D deficiency     Past Surgical History  Procedure Laterality Date  . Nephrectomy Right 1991    in [place], due to fibrosis and lithiasis  . Esophagomyomectomy (heller)  2009  . Hernia repair      x 6 surgeries   . Cesarean section      x 3   . Knee arthroscopy Right   . Gynecologic cryosurgery      X2    History   Social History  . Marital Status: Married    Spouse Name: N/A  . Number of Children: 3  . Years of Education: N/A   Occupational History  . stay home     Social History Main Topics  . Smoking status: Former Research scientist (life sciences)  . Smokeless tobacco: Not on file     Comment: quit ~ 2000 (2 ppd)  . Alcohol Use: 0.0 oz/week    0 Standard drinks  or equivalent per week     Comment: socially   . Drug Use: No  . Sexual Activity: No     Comment: 1ST INTERCOURSE- 16, PARTNERS - 2   Other Topics Concern  . Not on file   Social History Narrative   Original from [place]   3 children, 2 alive   Household --pt and husband         Medication List       This list is accurate as of: 02/05/15  3:19 PM.  Always use your most recent med list.               allopurinol 100 MG tablet  Commonly known as:  ZYLOPRIM  Take 1 tablet (100 mg total) by mouth daily.     aspirin 81 MG tablet  Take 81 mg by mouth daily.     atorvastatin 40 MG tablet  Commonly known as:  LIPITOR  Take 1 tablet (40 mg total) by mouth at bedtime.     cyclobenzaprine 10 MG tablet  Commonly known as:  FLEXERIL  Take 1 tablet (10 mg total) by mouth 2 (two) times daily as needed for muscle spasms.     DULoxetine 60 MG capsule  Commonly known as:  CYMBALTA  Take 1 capsule (60 mg total) by mouth daily.     hydrochlorothiazide 50 MG tablet  Commonly known as:  HYDRODIURIL  Take 1 tablet (50 mg total) by mouth daily.     levothyroxine 100 MCG tablet  Commonly known as:  SYNTHROID, LEVOTHROID  Take 1 tablet (100 mcg total) by mouth daily.     lisinopril 10 MG tablet  Commonly known as:  PRINIVIL,ZESTRIL  Take 1 tablet (10 mg total) by mouth daily.     metFORMIN 500 MG tablet  Commonly known as:  GLUCOPHAGE  Take 1 tablet (500 mg total) by mouth 2 (two) times daily with a meal.     nystatin-triamcinolone ointment  Commonly known as:  MYCOLOG  Apply 1 application topically 2 (two) times daily.     omeprazole 20 MG capsule  Commonly known as:  PRILOSEC  Take 1 capsule (20 mg total) by mouth daily.     raloxifene 60 MG tablet  Commonly known as:  EVISTA  Take 1 tablet (60 mg total) by mouth daily.     Vitamin D (Ergocalciferol) 50000 UNITS Caps capsule  Commonly known as:  DRISDOL  Take 1 capsule (50,000 Units total) by mouth every 7 (seven)  days.     zolpidem 5 MG tablet  Commonly known as:  AMBIEN  Take 1 tablet (5 mg total) by mouth at bedtime as needed for sleep.           Objective:   Physical Exam BP 130/78 mmHg  Pulse 92  Temp(Src) 98.2 F (36.8 C) (Oral)  Ht 5\' 3"  (1.6 m)  Wt 245 lb 6 oz (111.301 kg)  BMI 43.48 kg/m2  SpO2 93% General:   Well developed, well nourished . NAD.  HEENT:  Normocephalic . Face symmetric, atraumatic MSK: Slightly TTP at the lower spine Skin: Not pale. Not jaundice Neurologic:  alert & oriented X3.  Speech normal, gait appropriate for age and unassisted DTRs symmetric Psych--  Cognition and judgment appear intact.  Cooperative with normal attention span and concentration.  Behavior appropriate. No anxious or depressed appearing.        Assessment & Plan:     Hypothyroidism, Last TSH is slightly off, recheck a TSH.

## 2015-02-05 NOTE — Patient Instructions (Signed)
  Please schedule labs to be done within few days (fasting)   

## 2015-02-05 NOTE — Assessment & Plan Note (Signed)
Not taking Atacand per  endocrinologist recommendation, BP remains stable Continue lisinopril, check a BMP

## 2015-02-05 NOTE — Assessment & Plan Note (Signed)
Low back pain without radiation for 2 years, was previously seen by a physician in [place], MRI was done, she was recommended surgery. She never followed through, pain persists, request a referral Plan: Refer to orthopedic surgery

## 2015-02-05 NOTE — Progress Notes (Signed)
Pre visit review using our clinic review tool, if applicable. No additional management support is needed unless otherwise documented below in the visit note. 

## 2015-02-05 NOTE — Assessment & Plan Note (Signed)
Based on the last FLP she was switch to Lipitor, no apparent side effects. Plan: Check FLP, AST, ALT

## 2015-02-06 ENCOUNTER — Other Ambulatory Visit (INDEPENDENT_AMBULATORY_CARE_PROVIDER_SITE_OTHER): Payer: Federal, State, Local not specified - PPO

## 2015-02-06 DIAGNOSIS — E785 Hyperlipidemia, unspecified: Secondary | ICD-10-CM

## 2015-02-06 DIAGNOSIS — E039 Hypothyroidism, unspecified: Secondary | ICD-10-CM | POA: Diagnosis not present

## 2015-02-06 DIAGNOSIS — I10 Essential (primary) hypertension: Secondary | ICD-10-CM | POA: Diagnosis not present

## 2015-02-06 DIAGNOSIS — R7989 Other specified abnormal findings of blood chemistry: Secondary | ICD-10-CM

## 2015-02-06 LAB — BASIC METABOLIC PANEL
BUN: 17 mg/dL (ref 6–23)
CO2: 28 mEq/L (ref 19–32)
Calcium: 9.4 mg/dL (ref 8.4–10.5)
Chloride: 107 mEq/L (ref 96–112)
Creatinine, Ser: 0.74 mg/dL (ref 0.40–1.20)
GFR: 84.72 mL/min (ref >60.00)
Glucose, Bld: 99 mg/dL (ref 70–99)
Potassium: 4.4 mEq/L (ref 3.5–5.1)
Sodium: 141 mEq/L (ref 135–145)

## 2015-02-06 LAB — LIPID PANEL
Cholesterol: 168 mg/dL (ref 0–200)
HDL: 57.6 mg/dL (ref >39.00)
NonHDL: 110.4
Total CHOL/HDL Ratio: 3
Triglycerides: 209 mg/dL — ABNORMAL HIGH (ref 0.0–149.0)
VLDL: 41.8 mg/dL — ABNORMAL HIGH (ref 0.0–40.0)

## 2015-02-06 LAB — ALT: ALT: 15 U/L (ref 0–35)

## 2015-02-06 LAB — LDL CHOLESTEROL, DIRECT: LDL DIRECT: 76 mg/dL

## 2015-02-06 LAB — AST: AST: 17 U/L (ref 0–37)

## 2015-02-06 LAB — TSH: TSH: 3.58 u[IU]/mL (ref 0.35–4.50)

## 2015-02-09 MED ORDER — LEVOTHYROXINE SODIUM 125 MCG PO TABS: 125.0000 ug | ORAL_TABLET | Freq: Every day | ORAL | Status: DC

## 2015-02-09 NOTE — Addendum Note (Signed)
Addended by: Wilfrid Lund on: 02/09/2015 02:01 PM   Modules accepted: Orders, Medications

## 2015-03-06 ENCOUNTER — Other Ambulatory Visit: Payer: Self-pay | Admitting: Gynecology

## 2015-03-23 ENCOUNTER — Other Ambulatory Visit: Payer: Federal, State, Local not specified - PPO

## 2015-04-09 ENCOUNTER — Other Ambulatory Visit (INDEPENDENT_AMBULATORY_CARE_PROVIDER_SITE_OTHER): Payer: Federal, State, Local not specified - PPO

## 2015-04-09 DIAGNOSIS — E039 Hypothyroidism, unspecified: Secondary | ICD-10-CM

## 2015-04-09 LAB — TSH: TSH: 2.27 u[IU]/mL (ref 0.35–4.50)

## 2015-04-30 ENCOUNTER — Other Ambulatory Visit: Payer: Self-pay | Admitting: Internal Medicine

## 2015-05-04 ENCOUNTER — Other Ambulatory Visit: Payer: Self-pay | Admitting: Internal Medicine

## 2015-06-16 ENCOUNTER — Ambulatory Visit (INDEPENDENT_AMBULATORY_CARE_PROVIDER_SITE_OTHER): Payer: Federal, State, Local not specified - PPO | Admitting: Internal Medicine

## 2015-06-16 ENCOUNTER — Encounter: Payer: Self-pay | Admitting: Internal Medicine

## 2015-06-16 VITALS — BP 112/80 | HR 74 | Temp 98.0°F | Ht 63.0 in | Wt 239.1 lb

## 2015-06-16 DIAGNOSIS — E559 Vitamin D deficiency, unspecified: Secondary | ICD-10-CM | POA: Diagnosis not present

## 2015-06-16 DIAGNOSIS — Z114 Encounter for screening for human immunodeficiency virus [HIV]: Secondary | ICD-10-CM

## 2015-06-16 DIAGNOSIS — Z23 Encounter for immunization: Secondary | ICD-10-CM

## 2015-06-16 DIAGNOSIS — L989 Disorder of the skin and subcutaneous tissue, unspecified: Secondary | ICD-10-CM

## 2015-06-16 DIAGNOSIS — I10 Essential (primary) hypertension: Secondary | ICD-10-CM

## 2015-06-16 DIAGNOSIS — Z09 Encounter for follow-up examination after completed treatment for conditions other than malignant neoplasm: Secondary | ICD-10-CM

## 2015-06-16 DIAGNOSIS — Z1159 Encounter for screening for other viral diseases: Secondary | ICD-10-CM

## 2015-06-16 LAB — BASIC METABOLIC PANEL
BUN: 17 mg/dL (ref 6–23)
CALCIUM: 10.2 mg/dL (ref 8.4–10.5)
CO2: 30 mEq/L (ref 19–32)
Chloride: 103 mEq/L (ref 96–112)
Creatinine, Ser: 0.75 mg/dL (ref 0.40–1.20)
GFR: 83.32 mL/min (ref >60.00)
GLUCOSE: 97 mg/dL (ref 70–99)
Potassium: 4.6 mEq/L (ref 3.5–5.1)
SODIUM: 142 meq/L (ref 135–145)

## 2015-06-16 LAB — HEPATITIS C ANTIBODY: HCV Ab: NEGATIVE

## 2015-06-16 NOTE — Progress Notes (Signed)
Subjective:    Patient ID: Melinda Mckinney, female    DOB: Nov 19, 1955, 58 y.o.   MRN: [id number]  DOS:  06/16/2015 Type of visit - description : Routine office visit Interval history: Vitamin D deficiency: Note from Dr. Cruzita Lederer reviewed. Patient is not sure what type of vitamin D supplement she taking. Apparently not ergocalciferol Otherwise good compliance with medications, labs reviewed, due for a BMP.  Review of Systems Denies chest pain or difficulty breathing with activities of daily living. No nausea, vomiting, diarrhea  Past Medical History  Diagnosis Date  . Hypertension   . GERD (gastroesophageal reflux disease)   . Diabetes mellitus   . High cholesterol   . Thyroid disease   . Anxiety and depression   . Fibromyalgia   . Abnormal Pap smear of cervix   . S/p nephrectomy   . CAD (coronary artery disease) ~2009    stent   . Osteoporosis   . Insomnia 09/27/2013  . Cervical dysplasia   . Vitamin D deficiency   . DDD (degenerative disc disease), lumbar     Dr. Nelva Bush, recommends lumbar epidural steroid injections L5-S1 to the right    Past Surgical History  Procedure Laterality Date  . Nephrectomy Right 1991    in [place], due to fibrosis and lithiasis  . Esophagomyomectomy (heller)  2009  . Hernia repair      x 6 surgeries   . Cesarean section      x 3   . Knee arthroscopy Right   . Gynecologic cryosurgery      X2    Social History   Social History  . Marital Status: Married    Spouse Name: N/A  . Number of Children: 3  . Years of Education: N/A   Occupational History  . stay home     Social History Main Topics  . Smoking status: Former Research scientist (life sciences)  . Smokeless tobacco: Not on file     Comment: quit ~ 2000 (2 ppd)  . Alcohol Use: 0.0 oz/week    0 Standard drinks or equivalent per week     Comment: socially   . Drug Use: No  . Sexual Activity: No     Comment: 1ST INTERCOURSE- 16, PARTNERS - 2   Other Topics Concern  . Not on file   Social History  Narrative   Original from [place]   3 children, 2 alive   Household --pt and husband         Medication List       This list is accurate as of: 06/16/15  6:08 PM.  Always use your most recent med list.               allopurinol 100 MG tablet  Commonly known as:  ZYLOPRIM  Take 1 tablet (100 mg total) by mouth daily.     aspirin 81 MG tablet  Take 81 mg by mouth daily.     atorvastatin 40 MG tablet  Commonly known as:  LIPITOR  Take 1 tablet (40 mg total) by mouth at bedtime.     cyclobenzaprine 10 MG tablet  Commonly known as:  FLEXERIL  Take 1 tablet (10 mg total) by mouth 2 (two) times daily as needed for muscle spasms.     DULoxetine 60 MG capsule  Commonly known as:  CYMBALTA  Take 1 capsule (60 mg total) by mouth daily.     hydrochlorothiazide 50 MG tablet  Commonly known as:  HYDRODIURIL  Take  1 tablet (50 mg total) by mouth daily.     levothyroxine 125 MCG tablet  Commonly known as:  SYNTHROID, LEVOTHROID  Take 1 tablet (125 mcg total) by mouth daily before breakfast.     lisinopril 10 MG tablet  Commonly known as:  PRINIVIL,ZESTRIL  Take 1 tablet (10 mg total) by mouth daily.     metFORMIN 500 MG tablet  Commonly known as:  GLUCOPHAGE  Take 1 tablet (500 mg total) by mouth 2 (two) times daily with a meal.     nystatin-triamcinolone ointment  Commonly known as:  MYCOLOG  Apply 1 application topically 2 (two) times daily.     omeprazole 20 MG capsule  Commonly known as:  PRILOSEC  Take 1 capsule (20 mg total) by mouth daily.     raloxifene 60 MG tablet  Commonly known as:  EVISTA  Take 1 tablet (60 mg total) by mouth daily.     Vitamin D3 5000 UNITS Caps  Take 1 capsule by mouth.     zolpidem 5 MG tablet  Commonly known as:  AMBIEN  Take 1 tablet (5 mg total) by mouth at bedtime as needed for sleep.           Objective:   Physical Exam BP 112/80 mmHg  Pulse 74  Temp(Src) 98 F (36.7 C) (Oral)  Ht 5\' 3"  (1.6 m)  Wt 239 lb 2 oz  (108.466 kg)  BMI 42.37 kg/m2  SpO2 98% General:   Well developed, well nourished . NAD.  HEENT:  Normocephalic . Face symmetric, atraumatic Lungs:  CTA B Normal respiratory effort, no intercostal retractions, no accessory muscle use. Heart: RRR,  no murmur.  No pretibial edema bilaterally  Diabetic feet exam: No edema, normal pulses, pinprick examination normal Neurologic:  alert & oriented X3.  Speech normal, gait appropriate for age and unassisted Psych--  Cognition and judgment appear intact.  Cooperative with normal attention span and concentration.  Behavior appropriate. No anxious or depressed appearing.      Assessment & Plan:   Assessment > DM  no neuropathy HTN High cholesterol Hypothyroidism Anxiety depression, insomnia Vitamin D deficiency, saw Dr. Cruzita Lederer x 1 b/c no respons to supplements, ?malabsortion GERD H/o achalasia, s/p Heller 2009 CAD , stent ~ 2009 S/p R  Nephrectomy ([place] 1991 due to stones/fibrosis) MSK: --Fibromyalgia --DJD  Knee pain R --Back pain -- s/p epidural 10-16 Osteoporosis -- per Gyn who  Rx Evista 09-2014  h/o cervical dysplasia   Plan: DM: Well-controlled per last A1c HTN: Well-controlled, check a BMP Hypothyroidism: Last TSH satisfactory Vitamin D deficiency: Saw Dr. Cruzita Lederer, she was not sure why she was not responding to routine supplements, malabsortion?;she did prescribed ergocalciferol 50,000 units of vitamin D and OTC 5,000 u, last vitamin D normal. Currently the patient is not sure what type of supplements is taking. Recommend to take vitamin D OTC 5000 units every day and recheck on return to the office Back pain: Status post epidural, doing better. Skin hyperpigmentation: Also, continue with skin hyperpigmentation at the pretibial area, exam is confirmatory, Refer to dermatology Primary care: Check HIV, hep C and given a flu shot RTC 3 months for a physical exam

## 2015-06-16 NOTE — Progress Notes (Signed)
Pre visit review using our clinic review tool, if applicable. No additional management support is needed unless otherwise documented below in the visit note. 

## 2015-06-16 NOTE — Assessment & Plan Note (Signed)
DM: Well-controlled per last A1c HTN: Well-controlled, check a BMP Hypothyroidism: Last TSH satisfactory Vitamin D deficiency: Saw Dr. Cruzita Lederer, she was not sure why she was not responding to routine supplements, malabsortion?;she did prescribed ergocalciferol 50,000 units of vitamin D and OTC 5,000 u, last vitamin D normal. Currently the patient is not sure what type of supplements is taking. Recommend to take vitamin D OTC 5000 units every day and recheck on return to the office Back pain: Status post epidural, doing better. Skin hyperpigmentation: Also, continue with skin hyperpigmentation at the pretibial area, exam is confirmatory, Refer to dermatology Primary care: Check HIV, hep C and given a flu shot RTC 3 months for a physical exam

## 2015-06-16 NOTE — Patient Instructions (Addendum)
Get your blood work before you leave     Next visit  for a physical exam in 3 months    Please schedule an appointment at the front desk Please come back fasting

## 2015-06-17 ENCOUNTER — Encounter: Payer: Self-pay | Admitting: Internal Medicine

## 2015-06-17 LAB — HIV ANTIBODY (ROUTINE TESTING W REFLEX): HIV: NONREACTIVE

## 2015-07-21 ENCOUNTER — Ambulatory Visit: Payer: Federal, State, Local not specified - PPO | Admitting: Internal Medicine

## 2015-07-30 ENCOUNTER — Other Ambulatory Visit: Payer: Self-pay | Admitting: Internal Medicine

## 2015-08-03 ENCOUNTER — Telehealth: Payer: Self-pay | Admitting: Internal Medicine

## 2015-08-03 MED ORDER — LEVOTHYROXINE SODIUM 125 MCG PO TABS: 125.0000 ug | ORAL_TABLET | Freq: Every day | ORAL | Status: DC

## 2015-08-03 MED ORDER — OMEPRAZOLE 20 MG PO CPDR: 20.0000 mg | DELAYED_RELEASE_CAPSULE | Freq: Every day | ORAL | Status: DC

## 2015-08-03 NOTE — Telephone Encounter (Signed)
Rx's sent to [facility].

## 2015-08-03 NOTE — Telephone Encounter (Signed)
°  Relation to PO:[id number] Call back number:[phone] Pharmacy:[facility]  Reason for call: pt is needing rx   omeprazole (PRILOSEC) 20 MG capsule      And levothyroxine (SYNTHROID, LEVOTHROID) 125 MCG tablet , [facility] to [facility], pt has changed pharmacy.

## 2015-08-07 ENCOUNTER — Ambulatory Visit (INDEPENDENT_AMBULATORY_CARE_PROVIDER_SITE_OTHER): Payer: Federal, State, Local not specified - PPO | Admitting: Internal Medicine

## 2015-08-07 ENCOUNTER — Encounter: Payer: Self-pay | Admitting: Internal Medicine

## 2015-08-07 VITALS — BP 118/72 | HR 80 | Temp 98.5°F | Resp 12 | Wt 239.0 lb

## 2015-08-07 DIAGNOSIS — E039 Hypothyroidism, unspecified: Secondary | ICD-10-CM

## 2015-08-07 DIAGNOSIS — E559 Vitamin D deficiency, unspecified: Secondary | ICD-10-CM

## 2015-08-07 LAB — T4, FREE: FREE T4: 1.03 ng/dL (ref 0.60–1.60)

## 2015-08-07 LAB — VITAMIN D 25 HYDROXY (VIT D DEFICIENCY, FRACTURES): VITD: 29.63 ng/mL — AB (ref 30.00–100.00)

## 2015-08-07 LAB — TSH: TSH: 0.72 u[IU]/mL (ref 0.35–4.50)

## 2015-08-07 NOTE — Progress Notes (Signed)
Patient ID: Melinda Mckinney, female   DOB: [birth year], 59 y.o.   MRN: [id number]   HPI  Melinda Mckinney is a 59 y.o.-year-old female, returning for f/u for vitamin D deficiency and hypothyroidism. Last visit 6 mo ago.  She had a steroid inj in back in 05/2015.  Vitamin D def  - dx in 08/2014 - No h/o hypocalcemia.  I reviewed pt's pertinent labs: Component     Latest Ref Rng 09/12/2014 12/10/2014 12/12/2014 01/19/2015  Vitamin D 1, 25 (OH) Total     18 - 72 pg/mL    53  Vitamin D3 1, 25 (OH)         40  Vitamin D2 1, 25 (OH)         13  Vit D, 25-Hydroxy     30 - 100 ng/mL 24 (L) 16 (L) 17 (L)   VITD     30.00 - 100.00 ng/mL    26.02 (L)   Pt was started on Ergocalciferol 50,000 units weekly in 09/2014 >> now on:  Vitamin D 5000 units daily   Vitamin D in MVI once a day   Reviewed hx: A celiac panel was negative on 12/12/2014. She has abdominal pain, bloating, nausea and sometimes vomiting. She was seen by GI >> EGD >> had Sx for Acalasia. No h/o celiac ds.  Lab Results  Component Value Date   PTH 31 09/12/2014   CALCIUM 10.2 06/16/2015   CALCIUM 9.4 02/06/2015   CALCIUM 10.1 12/23/2014   CALCIUM 10.0 09/12/2014   CALCIUM 9.6 04/16/2014   CALCIUM 9.8 08/12/2013   CALCIUM 10.1 07/30/2012   CALCIUM 9.6 08/18/2011   CALCIUM 10.0 09/30/2008   CALCIUM 9.5 02/14/2008   She also has a h/o osteoporosis. I reviewed pt's DEXA scans: Date L1-L4 T score FN T score  09/04/2014 (Hologic)  -2.8 LFN: -1.1 RFN: -1.2  03/25/2007 (Lunar)  -2.6   Not on med for OP.  No fractures or falls.   No hand cramping or perioral numbness.  + h/o kidney stones. R nephrectomy for hydronephrosis 2/2 kidney stone.  At that time, she also had surgery for incarcerated hernia - had several hernias  No h/o CKD. Last BUN/Cr: Lab Results  Component Value Date   BUN 17 06/16/2015   CREATININE 0.75 06/16/2015   She has hypothyroidism: - on LT4 100 >> 125 mcg - in am - with water - eats 1h after LT4   - we separated PPI (now 30 min after b'fast) - we separated MVI (now mid-day) - no calcium, iron  Last TSH: Lab Results  Component Value Date   TSH 2.27 04/09/2015   She had pbs swallowing with food, no hoarseness, no SOB when lying down.  + FH with thyroid ds: Mother, daughter, and son. No ThyCa.    I reviewed her chart and she also has a history of prior nephrectomy, OA.   ROS: Constitutional: no weight gain/loss, no fatigue, no subjective hyperthermia/hypothermia Eyes: no blurry vision, no xerophthalmia ENT: no sore throat, no nodules palpated in throat, no dysphagia/odynophagia, no hoarseness Cardiovascular: no CP/SOB/palpitations/leg swelling Respiratory: no cough/SOB Gastrointestinal: no N/V/D/C Musculoskeletal: + muscle/+ joint aches (back, R knee) Skin: no rashes Neurological: no tremors/numbness/tingling/dizziness   I reviewed pt's medications, allergies, PMH, social hx, family hx, and changes were documented in the history of present illness. Otherwise, unchanged from my initial visit note.  Past Medical History  Diagnosis Date  . Hypertension   . GERD (gastroesophageal reflux disease)   .  Diabetes mellitus   . High cholesterol   . Thyroid disease   . Anxiety and depression   . Fibromyalgia   . Abnormal Pap smear of cervix   . S/p nephrectomy   . CAD (coronary artery disease) ~2009    stent   . Osteoporosis   . Insomnia 09/27/2013  . Cervical dysplasia   . Vitamin D deficiency   . DDD (degenerative disc disease), lumbar     Dr. Nelva Bush, recommends lumbar epidural steroid injections L5-S1 to the right  . Lichen sclerosus     Vulvar area   Past Surgical History  Procedure Laterality Date  . Nephrectomy Right 1991    in [place], due to fibrosis and lithiasis  . Esophagomyomectomy (heller)  2009  . Hernia repair      x 6 surgeries   . Cesarean section      x 3   . Knee arthroscopy Right   . Gynecologic cryosurgery      X2   Social History    Social History  . Marital Status: Married    Spouse Name: N/A  . Number of Children: 3  . Years of Education: N/A   Occupational History  . stay home     Social History Main Topics  . Smoking status: Former Research scientist (life sciences)  . Smokeless tobacco: Not on file     Comment: quit ~ 2000 (2 ppd)  . Alcohol Use: 0.0 oz/week    0 Standard drinks or equivalent per week     Comment: socially   . Drug Use: No  . Sexual Activity: No     Comment: 1ST INTERCOURSE- 16, PARTNERS - 2   Other Topics Concern  . Not on file   Social History Narrative   Original from [place]   3 children, 2 alive   Household --pt and husband    Current Outpatient Prescriptions on File Prior to Visit  Medication Sig Dispense Refill  . allopurinol (ZYLOPRIM) 100 MG tablet Take 1 tablet (100 mg total) by mouth daily. 90 tablet 2  . aspirin 81 MG tablet Take 81 mg by mouth daily.    Marland Kitchen atorvastatin (LIPITOR) 40 MG tablet Take 1 tablet (40 mg total) by mouth at bedtime. 30 tablet 5  . Cholecalciferol (VITAMIN D3) 5000 UNITS CAPS Take 1 capsule by mouth.    . cyclobenzaprine (FLEXERIL) 10 MG tablet Take 1 tablet (10 mg total) by mouth 2 (two) times daily as needed for muscle spasms. (Patient not taking: Reported on 06/16/2015) 40 tablet 2  . DULoxetine (CYMBALTA) 60 MG capsule Take 1 capsule (60 mg total) by mouth daily. 90 capsule 2  . hydrochlorothiazide (HYDRODIURIL) 50 MG tablet Take 1 tablet (50 mg total) by mouth daily. 90 tablet 2  . levothyroxine (SYNTHROID, LEVOTHROID) 125 MCG tablet Take 1 tablet (125 mcg total) by mouth daily before breakfast. 90 tablet 1  . lisinopril (PRINIVIL,ZESTRIL) 10 MG tablet Take 1 tablet (10 mg total) by mouth daily. 90 tablet 2  . metFORMIN (GLUCOPHAGE) 500 MG tablet Take 1 tablet (500 mg total) by mouth 2 (two) times daily with a meal. 180 tablet 2  . nystatin-triamcinolone ointment (MYCOLOG) Apply 1 application topically 2 (two) times daily. (Patient not taking: Reported on 06/16/2015)  30 g 3  . omeprazole (PRILOSEC) 20 MG capsule Take 1 capsule (20 mg total) by mouth daily. 90 capsule 1  . raloxifene (EVISTA) 60 MG tablet Take 1 tablet (60 mg total) by mouth daily. 30 tablet  11  . zolpidem (AMBIEN) 5 MG tablet Take 1 tablet (5 mg total) by mouth at bedtime as needed for sleep. 90 tablet 1   No current facility-administered medications on file prior to visit.   Allergies  Allergen Reactions  . Penicillins Other (See Comments)    Unknown reaction   Family History  Problem Relation Age of Onset  . Colon cancer Neg Hx   . Breast cancer Other     aunt   . CAD Brother   . Lung cancer Mother     F and M  . Diabetes Father     F and mother    PE: BP 118/72 mmHg  Pulse 80  Temp(Src) 98.5 F (36.9 C) (Oral)  Resp 12  Wt 239 lb (108.41 kg)  SpO2 97% Body mass index is 42.35 kg/(m^2). Wt Readings from Last 3 Encounters:  08/07/15 239 lb (108.41 kg)  06/16/15 239 lb 2 oz (108.466 kg)  02/05/15 245 lb 6 oz (111.301 kg)   Constitutional: overweight, in NAD.  Eyes: PERRLA, EOMI, no exophthalmos ENT: moist mucous membranes, no thyromegaly, no cervical lymphadenopathy Cardiovascular: RRR, No MRG Respiratory: CTA B Gastrointestinal: abdomen soft, NT, ND, BS+ Musculoskeletal: no deformities, strength intact in all 4; Chvostek sign negative. Skin: moist, warm, no rashes Neurological: no tremor with outstretched hands, DTR delayed in all 4  Assessment: 1. Vitamin D deficiency  2. Hypothyroidism  PLAN: 1. Patient had a low vitamin D in 09/12/2014, at 24, however, this decreased further despite starting ergocalciferol 50,000 units once a week. - There is no apparent reason for malabsorption: Celiac workup negative, no h/o malabsorption (has a history of Barrett's esophagus with surveillance EGDs, also had achalasia surgery.  - Reviewed together last vit D: 26, still low, but better.  - will check her Vitamin D today - continue her current supplementation with:    Vitamin D 5000 units daily   Vitamin D in MVI once a day  - I will see the patient back in 1 year  2. Hypothyroidism - At last visit, we separated MVI and PPI from LT4 >> next TSH normal. She moved MVI at lunch but PPI only 1.5 h after LT4  - will continue with this as she is taking PPI every day - I advised her to take the thyroid hormone every day, with water, >30 minutes before breakfast, separated by >4 hours from acid reflux medications, calcium, iron, multivitamins. - we will recheck her TFTs today  Orders Placed This Encounter  Procedures  . TSH  . T4, free  . Vitamin D, 25-hydroxy   CC: Dr. Uvaldo Rising  Component     Latest Ref Rng 08/07/2015  TSH     0.35 - 4.50 uIU/mL 0.72  VITD     30.00 - 100.00 ng/mL 29.63 (L)  Free T4     0.60 - 1.60 ng/dL 1.03   TFTs great! Continue LT4 125 mcg daily. Vit D level  almost normal. Will continue current vit D doses for now.

## 2015-08-07 NOTE — Patient Instructions (Signed)
Please continue the current vitamin D regimen: 5000 units daily.  Take the thyroid hormone every day, with water, >30 minutes before breakfast, separated by >4 hours from acid reflux medications, calcium, iron, multivitamins.  Please stop at the lab.  Please return in 1 year.

## 2015-08-28 LAB — HM MAMMOGRAPHY

## 2015-09-01 ENCOUNTER — Encounter: Payer: Self-pay | Admitting: Internal Medicine

## 2015-09-03 ENCOUNTER — Ambulatory Visit (INDEPENDENT_AMBULATORY_CARE_PROVIDER_SITE_OTHER): Payer: Federal, State, Local not specified - PPO | Admitting: Gynecology

## 2015-09-03 ENCOUNTER — Encounter: Payer: Self-pay | Admitting: Gynecology

## 2015-09-03 VITALS — BP 134/86 | Ht 62.5 in | Wt 235.0 lb

## 2015-09-03 DIAGNOSIS — Z01419 Encounter for gynecological examination (general) (routine) without abnormal findings: Secondary | ICD-10-CM | POA: Diagnosis not present

## 2015-09-03 DIAGNOSIS — N9089 Other specified noninflammatory disorders of vulva and perineum: Secondary | ICD-10-CM

## 2015-09-03 DIAGNOSIS — Z8639 Personal history of other endocrine, nutritional and metabolic disease: Secondary | ICD-10-CM | POA: Diagnosis not present

## 2015-09-03 DIAGNOSIS — M810 Age-related osteoporosis without current pathological fracture: Secondary | ICD-10-CM | POA: Diagnosis not present

## 2015-09-03 MED ORDER — CLOBETASOL PROPIONATE 0.05 % EX CREA: 1.0000 "application " | TOPICAL_CREAM | Freq: Two times a day (BID) | CUTANEOUS | Status: DC

## 2015-09-03 NOTE — Patient Instructions (Signed)
Densitometra sea (Bone Densitometry) La densitometra sea es un estudio de diagnstico por imgenes en el que se utiliza una radiografa especial que mide la cantidad de calcio y otros minerales en los huesos (densidad sea). Este estudio tambin se conoce como examen de densidad mineral sea o radioabsorciometra de doble energa (DEXA). Puede medir la densidad sea en la cadera y la columna. Es similar a una radiografa comn. Tambin pueden hacerle este estudio para:  Diagnosticar una enfermedad que causa huesos dbiles o delgados (osteoporosis).  Predecir el riesgo de un hueso roto (fractura).  Determinar si el tratamiento para la osteoporosis funciona. INFORME A SU MDICO:  Cualquier alergia que tenga.  Todos los medicamentos que utiliza, incluidos vitaminas, hierbas, gotas oftlmicas, cremas y medicamentos de venta libre.  Problemas previos que usted o los miembros de su familia hayan tenido con el uso de anestsicos.  Enfermedades de la sangre que tenga.  Si tiene cirugas previas.  Enfermedades que tenga.  Probabilidad de embarazo.  Cualquier otro estudio mdico al que se haya sometido en los ltimos 14 das en el que se haya utilizado material de contraste. RIESGOS Y COMPLICACIONES En general, se trata de un procedimiento seguro. Sin embargo, pueden ocurrir algunos problemas, entre los que se pueden incluir los siguientes:  Este estudio lo expone a una cantidad muy pequea de radiacin.  Los riesgos de la exposicin a la radiacin pueden ser mayores para los nios por nacer. ANTES DEL PROCEDIMIENTO  No tome ningn suplemento de calcio durante 24 horas antes de realizarse el estudio. Puede comer y beber como lo hace habitualmente.  Qutese todas las joyas de metal, anteojos, aparatos dentales y cualquier otro objeto metlico. PROCEDIMIENTO  Deber recostarse en una camilla. Un generador de rayos X estar ubicado debajo de usted y un dispositivo de imgenes, por  encima.  Se pueden usar otros dispositivos, como cajas o abrazaderas, para posicionar el cuerpo apropiadamente para la exploracin.  Deber permanecer inmvil mientras la mquina explore lentamente su cuerpo.  Las imgenes se muestran en el monitor de una computadora. DESPUS DEL PROCEDIMIENTO Es posible que necesite estudios adicionales ms adelante.   Esta informacin no tiene como fin reemplazar el consejo del mdico. Asegrese de hacerle al mdico cualquier pregunta que tenga.   Document Released: 04/25/2012 Document Revised: 08/22/2014 Elsevier Interactive Patient Education 2016 Elsevier Inc.  

## 2015-09-03 NOTE — Progress Notes (Signed)
Melinda Mckinney Apr 22, 1956 [id number]   History:    60 y.o.  for annual gyn exam with history of osteoporosis of the AP spine who was started on Evista 60 mg every weekly in 2016. Patient also had been referred to endocrinologist because of her persistent vitamin D deficiency for which she was treated with 50,000 units every weekly and will need of have her vitamin D level checked today. Her PCP is Dr. Larose Kells was been doing her blood work. Patient has complained at times of vaginal irritation and pruritus. Patient has no vaginal discharge. 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 Her bone density study in January 2016 before her Evista was started at indicated that her lowest T score was -2.8 at the AP spine. Her colonoscopy was in 2014 and benign polyps and then removed.  Past medical history,surgical history, family history and social history were all reviewed and documented in the EPIC chart.  Gynecologic History No LMP recorded. Patient is postmenopausal. Contraception: post menopausal status Last Pap: 2016. Results were: normal Last mammogram: January 2017. Results were: Patient reports that she was told it was normal we have no report  Obstetric History OB History  Gravida Para Term Preterm AB SAB TAB Ectopic Multiple Living  4 2   2 2    2     # Outcome Date GA Lbr Len/2nd Weight Sex Delivery Anes PTL Lv  4 SAB           3 SAB           2 Para           1 Para                ROS: A ROS was performed and pertinent positives and negatives are included in the history.  GENERAL: No fevers or chills. HEENT: No change in vision, no earache, sore throat or sinus congestion. NECK: No pain or stiffness. CARDIOVASCULAR: No chest pain or pressure. No palpitations. PULMONARY: No shortness of breath, cough or wheeze. GASTROINTESTINAL: No abdominal pain, nausea, vomiting or diarrhea, melena or bright red blood per rectum. GENITOURINARY: No  urinary frequency, urgency, hesitancy or dysuria. MUSCULOSKELETAL: No joint or muscle pain, no back pain, no recent trauma. DERMATOLOGIC: No rash, no itching, no lesions. ENDOCRINE: No polyuria, polydipsia, no heat or cold intolerance. No recent change in weight. HEMATOLOGICAL: No anemia or easy bruising or bleeding. NEUROLOGIC: No headache, seizures, numbness, tingling or weakness. PSYCHIATRIC: No depression, no loss of interest in normal activity or change in sleep pattern.     Exam: chaperone present  BP 134/86 mmHg  Ht 5' 2.5" (1.588 m)  Wt 235 lb (106.595 kg)  BMI 42.27 kg/m2  Body mass index is 42.27 kg/(m^2).  General appearance : Well developed well nourished female. No acute distress HEENT: Eyes: no retinal hemorrhage or exudates,  Neck supple, trachea midline, no carotid bruits, no thyroidmegaly Lungs: Clear to auscultation, no rhonchi or wheezes, or rib retractions  Heart: Regular rate and rhythm, no murmurs or gallops Breast:Examined in sitting and supine position were symmetrical in appearance, no palpable masses or tenderness,  no skin retraction, no nipple inversion, no nipple discharge, no skin discoloration, no axillary or supraclavicular lymphadenopathy Abdomen: no palpable masses or tenderness, no rebound or guarding Extremities: no edema or skin discoloration or tenderness  Pelvic:  Bartholin, Urethra, Skene Glands: Within normal limits  Vagina: No gross lesions or discharge  Cervix: No gross lesions or discharge  Uterus  anteverted, normal size, shape and consistency, non-tender and mobile  Adnexa  Without masses or tenderness  Anus and perineum  normal   Rectovaginal  normal sphincter tone without palpated masses or tenderness             Hemoccult cards will be provided     Assessment/Plan:  60 y.o. female for annual exam with history of osteoporosis has been on Evista close to a year. We'll need to check her bone density study next month to monitor  response to treatment. Patient with history of vitamin D deficiency and hypothyroidism has been followed by the endocrinologist Dr.Gherge. We'll check her vitamin D level today. She was provided with fecal Hemoccult cards dismissed to the office for testing. Her PCP will be doing the rest of her blood work. Pap smear was not done today according to the new guidelines. Her vaccines are up-to-date. For her vulvar dryness and irritation going to prescribe her clobetasol 0.05% to apply twice a day for the first week then once a week when necessary.    Terrance Mass MD, 11:23 AM 09/03/2015

## 2015-09-04 LAB — VITAMIN D 25 HYDROXY (VIT D DEFICIENCY, FRACTURES): VIT D 25 HYDROXY: 24 ng/mL — AB (ref 30–100)

## 2015-09-17 ENCOUNTER — Ambulatory Visit (INDEPENDENT_AMBULATORY_CARE_PROVIDER_SITE_OTHER): Payer: Federal, State, Local not specified - PPO

## 2015-09-17 ENCOUNTER — Other Ambulatory Visit: Payer: Self-pay | Admitting: Gynecology

## 2015-09-17 DIAGNOSIS — Z8639 Personal history of other endocrine, nutritional and metabolic disease: Secondary | ICD-10-CM | POA: Diagnosis not present

## 2015-09-17 DIAGNOSIS — M810 Age-related osteoporosis without current pathological fracture: Secondary | ICD-10-CM

## 2015-09-18 ENCOUNTER — Other Ambulatory Visit: Payer: Federal, State, Local not specified - PPO | Admitting: Anesthesiology

## 2015-09-18 DIAGNOSIS — K921 Melena: Secondary | ICD-10-CM

## 2015-09-18 DIAGNOSIS — Z1211 Encounter for screening for malignant neoplasm of colon: Secondary | ICD-10-CM

## 2015-10-06 ENCOUNTER — Encounter: Payer: Federal, State, Local not specified - PPO | Admitting: Internal Medicine

## 2015-10-16 ENCOUNTER — Other Ambulatory Visit: Payer: Self-pay | Admitting: Internal Medicine

## 2015-12-24 DIAGNOSIS — K219 Gastro-esophageal reflux disease without esophagitis: Secondary | ICD-10-CM | POA: Diagnosis not present

## 2015-12-24 DIAGNOSIS — Z86010 Personal history of colonic polyps: Secondary | ICD-10-CM | POA: Diagnosis not present

## 2015-12-24 DIAGNOSIS — K2270 Barrett's esophagus without dysplasia: Secondary | ICD-10-CM | POA: Diagnosis not present

## 2015-12-28 ENCOUNTER — Encounter: Payer: Self-pay | Admitting: Internal Medicine

## 2015-12-28 ENCOUNTER — Ambulatory Visit (INDEPENDENT_AMBULATORY_CARE_PROVIDER_SITE_OTHER): Payer: Federal, State, Local not specified - PPO | Admitting: Internal Medicine

## 2015-12-28 VITALS — BP 124/78 | HR 79 | Temp 98.5°F | Ht 63.0 in | Wt 239.1 lb

## 2015-12-28 DIAGNOSIS — Z0000 Encounter for general adult medical examination without abnormal findings: Secondary | ICD-10-CM | POA: Diagnosis not present

## 2015-12-28 DIAGNOSIS — E1159 Type 2 diabetes mellitus with other circulatory complications: Secondary | ICD-10-CM

## 2015-12-28 DIAGNOSIS — E119 Type 2 diabetes mellitus without complications: Secondary | ICD-10-CM

## 2015-12-28 DIAGNOSIS — Z0100 Encounter for examination of eyes and vision without abnormal findings: Secondary | ICD-10-CM | POA: Diagnosis not present

## 2015-12-28 DIAGNOSIS — Z23 Encounter for immunization: Secondary | ICD-10-CM

## 2015-12-28 DIAGNOSIS — Z09 Encounter for follow-up examination after completed treatment for conditions other than malignant neoplasm: Secondary | ICD-10-CM

## 2015-12-28 LAB — BASIC METABOLIC PANEL
BUN: 22 mg/dL (ref 6–23)
CHLORIDE: 106 meq/L (ref 96–112)
CO2: 27 mEq/L (ref 19–32)
Calcium: 9.7 mg/dL (ref 8.4–10.5)
Creatinine, Ser: 0.84 mg/dL (ref 0.40–1.20)
GFR: 72.98 mL/min (ref >60.00)
GLUCOSE: 95 mg/dL (ref 70–99)
Potassium: 4.2 mEq/L (ref 3.5–5.1)
SODIUM: 141 meq/L (ref 135–145)

## 2015-12-28 LAB — LIPID PANEL
CHOLESTEROL: 198 mg/dL (ref 0–200)
HDL: 65.2 mg/dL (ref >39.00)
LDL Cholesterol: 98 mg/dL (ref 0–99)
NonHDL: 132.34
TRIGLYCERIDES: 170 mg/dL — AB (ref 0.0–149.0)
Total CHOL/HDL Ratio: 3
VLDL: 34 mg/dL (ref 0.0–40.0)

## 2015-12-28 LAB — CBC WITH DIFFERENTIAL/PLATELET
BASOS ABS: 0.1 10*3/uL (ref 0.0–0.1)
Basophils Relative: 0.8 % (ref 0.0–3.0)
EOS ABS: 0.2 10*3/uL (ref 0.0–0.7)
Eosinophils Relative: 2.7 % (ref 0.0–5.0)
HEMATOCRIT: 34.4 % — AB (ref 36.0–46.0)
Hemoglobin: 11.4 g/dL — ABNORMAL LOW (ref 12.0–15.0)
LYMPHS ABS: 2.1 10*3/uL (ref 0.7–4.0)
LYMPHS PCT: 28.3 % (ref 12.0–46.0)
MCHC: 33.2 g/dL (ref 30.0–36.0)
MCV: 88.8 fl (ref 78.0–100.0)
MONOS PCT: 5.9 % (ref 3.0–12.0)
Monocytes Absolute: 0.4 10*3/uL (ref 0.1–1.0)
NEUTROS PCT: 62.3 % (ref 43.0–77.0)
Neutro Abs: 4.7 10*3/uL (ref 1.4–7.7)
PLATELETS: 329 10*3/uL (ref 150.0–400.0)
RBC: 3.87 Mil/uL (ref 3.87–5.11)
RDW: 15 % (ref 11.5–15.5)
WBC: 7.5 10*3/uL (ref 4.0–10.5)

## 2015-12-28 LAB — TSH: TSH: 3.37 u[IU]/mL (ref 0.35–4.50)

## 2015-12-28 LAB — AST: AST: 22 U/L (ref 0–37)

## 2015-12-28 LAB — ALT: ALT: 21 U/L (ref 0–35)

## 2015-12-28 MED ORDER — HYDROCHLOROTHIAZIDE 50 MG PO TABS: 50.0000 mg | ORAL_TABLET | Freq: Every day | ORAL | Status: DC

## 2015-12-28 MED ORDER — ALLOPURINOL 100 MG PO TABS: 100.0000 mg | ORAL_TABLET | Freq: Every day | ORAL | Status: DC

## 2015-12-28 MED ORDER — LISINOPRIL 10 MG PO TABS: 10.0000 mg | ORAL_TABLET | Freq: Every day | ORAL | Status: DC

## 2015-12-28 MED ORDER — ZOLPIDEM TARTRATE 5 MG PO TABS: 5.0000 mg | ORAL_TABLET | Freq: Every evening | ORAL | Status: DC | PRN

## 2015-12-28 MED ORDER — OMEPRAZOLE 20 MG PO CPDR: 20.0000 mg | DELAYED_RELEASE_CAPSULE | Freq: Every day | ORAL | Status: DC

## 2015-12-28 MED ORDER — DULOXETINE HCL 60 MG PO CPEP: 60.0000 mg | ORAL_CAPSULE | Freq: Every day | ORAL | Status: DC

## 2015-12-28 MED ORDER — METFORMIN HCL 500 MG PO TABS: 500.0000 mg | ORAL_TABLET | Freq: Two times a day (BID) | ORAL | Status: DC

## 2015-12-28 NOTE — Assessment & Plan Note (Addendum)
Td-- 2015; pnm shot 2015; prevnar 12-28-15; zostavax 12-28-2015    Last gyn visit 08-2015 11/21/2013---  Cscope done, repeat in 2 years-- repeated cscope ~ 11-2015, repeat 2 years Labs: BMP, AST, ALT, FLP, CBC, TSH Diet  discussed , exercise limited.

## 2015-12-28 NOTE — Progress Notes (Signed)
Subjective:    Patient ID: Melinda Mckinney, female    DOB: 02-05-1956, 60 y.o.   MRN: [id number]  DOS:  12/28/2015 Type of visit - description : CPX Interval history: Other issues discussed today    Review of Systems  Constitutional: No fever. No chills. No unexplained wt changes. No unusual sweats  HEENT: No dental problems, no ear discharge, no facial swelling, no voice changes. No eye discharge, no eye  redness. She had Bell palsy years ago, some light intolerance since, left eye. Refer to ophthalmology today   Respiratory: No wheezing , no  difficulty breathing. Occasional cough mostly when she has GERD symptoms  Cardiovascular: No CP, no leg swelling , no  Palpitations  GI: no nausea, no vomiting, no diarrhea , no  abdominal pain.  No blood in the stools. No dysphagia, no odynophagia    Endocrine: No polyphagia, no polyuria , no polydipsia  GU: No dysuria, gross hematuria, difficulty urinating. No urinary urgency, no frequency.  Musculoskeletal: No joint swellings. Still has aches and pains due to fibromyalgia  Skin: No change in the color of the skin, palor , no  Rash  Allergic, immunologic: No environmental allergies , no  food allergies  Neurological: No dizziness no  syncope. No headaches. No diplopia, no slurred, no slurred speech, no motor deficits, no facial  Numbness  Hematological: No enlarged lymph nodes, no easy bruising , no unusual bleedings  Psychiatry: No suicidal ideas, no hallucinations, no beavior problems, no confusion.  Symptoms controlled with Cymbalta   Past Medical History  Diagnosis Date  . Hypertension   . GERD (gastroesophageal reflux disease)   . Diabetes mellitus   . High cholesterol   . Thyroid disease   . Anxiety and depression   . Fibromyalgia   . Abnormal Pap smear of cervix   . S/p nephrectomy   . CAD (coronary artery disease) ~2009    stent   . Osteoporosis   . Insomnia 09/27/2013  . Cervical dysplasia   . Vitamin D  deficiency   . DDD (degenerative disc disease), lumbar     Dr. Nelva Bush, recommends lumbar epidural steroid injections L5-S1 to the right  . Lichen sclerosus     Vulvar area    Past Surgical History  Procedure Laterality Date  . Nephrectomy Right 1991    in [place], due to fibrosis and lithiasis  . Esophagomyomectomy (heller)  2009  . Hernia repair      x 6 surgeries   . Cesarean section      x 3   . Knee arthroscopy Right   . Gynecologic cryosurgery      X2    Social History   Social History  . Marital Status: Married    Spouse Name: N/A  . Number of Children: 3  . Years of Education: N/A   Occupational History  . stay home     Social History Main Topics  . Smoking status: Former Research scientist (life sciences)  . Smokeless tobacco: Not on file     Comment: quit ~ 2000 (2 ppd)  . Alcohol Use: 0.0 oz/week    0 Standard drinks or equivalent per week     Comment: socially   . Drug Use: No  . Sexual Activity: No     Comment: 1ST INTERCOURSE- 16, PARTNERS - 2   Other Topics Concern  . Not on file   Social History Narrative   Original from [place]   3 children, 2 alive  Household --pt and husband      Family History  Problem Relation Age of Onset  . Colon cancer Neg Hx   . Breast cancer Other     aunt   . CAD Brother   . Lung cancer Mother     F and M  . Diabetes Father     F and mother        Medication List       This list is accurate as of: 12/28/15 11:59 PM.  Always use your most recent med list.               allopurinol 100 MG tablet  Commonly known as:  ZYLOPRIM  Take 1 tablet (100 mg total) by mouth daily.     aspirin 81 MG tablet  Take 81 mg by mouth daily.     atorvastatin 40 MG tablet  Commonly known as:  LIPITOR  Take 1 tablet (40 mg total) by mouth at bedtime.     clobetasol cream 0.05 %  Commonly known as:  TEMOVATE  Apply 1 application topically 2 (two) times daily. Apply twice a week first week then once weekly as needed      cyclobenzaprine 10 MG tablet  Commonly known as:  FLEXERIL  Take 1 tablet (10 mg total) by mouth 2 (two) times daily as needed for muscle spasms.     DULoxetine 60 MG capsule  Commonly known as:  CYMBALTA  Take 1 capsule (60 mg total) by mouth daily.     hydrochlorothiazide 50 MG tablet  Commonly known as:  HYDRODIURIL  Take 1 tablet (50 mg total) by mouth daily.     levothyroxine 125 MCG tablet  Commonly known as:  SYNTHROID, LEVOTHROID  Take 1 tablet (125 mcg total) by mouth daily before breakfast.     lisinopril 10 MG tablet  Commonly known as:  PRINIVIL,ZESTRIL  Take 1 tablet (10 mg total) by mouth daily.     metFORMIN 500 MG tablet  Commonly known as:  GLUCOPHAGE  Take 1 tablet (500 mg total) by mouth 2 (two) times daily with a meal.     omeprazole 20 MG capsule  Commonly known as:  PRILOSEC  Take 1 capsule (20 mg total) by mouth daily.     raloxifene 60 MG tablet  Commonly known as:  EVISTA  Take 1 tablet (60 mg total) by mouth daily.     Vitamin D3 5000 units Caps  Take 1 capsule by mouth.     zolpidem 5 MG tablet  Commonly known as:  AMBIEN  Take 1 tablet (5 mg total) by mouth at bedtime as needed for sleep.           Objective:   Physical Exam BP 124/78 mmHg  Pulse 79  Temp(Src) 98.5 F (36.9 C) (Oral)  Ht 5\' 3"  (1.6 m)  Wt 239 lb 2 oz (108.466 kg)  BMI 42.37 kg/m2  SpO2 97% General:   Well developed, well nourished . NAD.  Neck: No  thyromegaly  HEENT:  Normocephalic . Face symmetric, atraumatic Lungs:  CTA B Normal respiratory effort, no intercostal retractions, no accessory muscle use. Heart: RRR,  no murmur.  No pretibial edema bilaterally  Abdomen:  Not distended, soft, non-tender. No rebound or rigidity.   Skin: Exposed areas without rash. Not pale. Not jaundice Neurologic:  alert & oriented X3.  Speech normal, gait   unassisted, somewhat limited by obesity pain Strength symmetric and appropriate for age.  Psych:  Cognition and  judgment appear intact.  Cooperative with normal attention span and concentration.  Behavior appropriate. No anxious or depressed appearing.     Assessment & Plan:   Assessment > DM  no neuropathy HTN High cholesterol Hypothyroidism Anxiety depression, insomnia Vitamin D deficiency, saw Dr. Cruzita Lederer x 1 b/c no respons to supplements, ?malabsortion GERD H/o achalasia, s/p Heller 2009 CAD , stent ~ 2009 SINGLE KIDNEY---S/p R  Nephrectomy ([place] 1991 due to stones/fibrosis) MSK: --Fibromyalgia --DJD  Knee pain R --Back pain -- s/p epidural 10-16 Osteoporosis -- per Gyn who  Rx Evista 09-2014  h/o cervical dysplasia   Plan: DM: Well-controlled per last A1c. Refer to ophthalmology HTN: Continue Zestril, HCTZ, check a BMP High cholesterol: Continue Lipitor, check labs. Hypothyroidism: Follow-up by endocrinology, next appointment is in several months, check a TSH Anxiety depression insomnia: Refill Ambien continue Cymbalta. CAD: asx for years, continue aspirin, Lipitor, ACE inhibitor's. Single kidney: Check a BMP today. Osteoporosis: Per gynecology, on Evista. RTC 6 months

## 2015-12-28 NOTE — Progress Notes (Signed)
Pre visit review using our clinic review tool, if applicable. No additional management support is needed unless otherwise documented below in the visit note. 

## 2015-12-28 NOTE — Patient Instructions (Addendum)
GO TO THE LAB : Get the blood work     GO TO THE FRONT DESK Schedule your next appointment for a  CHECK UP IN 6 MONTHS

## 2015-12-29 ENCOUNTER — Encounter: Payer: Self-pay | Admitting: Internal Medicine

## 2015-12-29 NOTE — Assessment & Plan Note (Signed)
DM: Well-controlled per last A1c. Refer to ophthalmology HTN: Continue Zestril, HCTZ, check a BMP High cholesterol: Continue Lipitor, check labs. Hypothyroidism: Follow-up by endocrinology, next appointment is in several months, check a TSH Anxiety depression insomnia: Refill Ambien continue Cymbalta. CAD: asx for years, continue aspirin, Lipitor, ACE inhibitor's. Single kidney: Check a BMP today. Osteoporosis: Per gynecology, on Evista. RTC 6 months

## 2016-02-09 DIAGNOSIS — E119 Type 2 diabetes mellitus without complications: Secondary | ICD-10-CM | POA: Diagnosis not present

## 2016-02-09 DIAGNOSIS — H2513 Age-related nuclear cataract, bilateral: Secondary | ICD-10-CM | POA: Diagnosis not present

## 2016-02-09 DIAGNOSIS — G510 Bell's palsy: Secondary | ICD-10-CM | POA: Diagnosis not present

## 2016-02-09 DIAGNOSIS — H524 Presbyopia: Secondary | ICD-10-CM | POA: Diagnosis not present

## 2016-02-24 ENCOUNTER — Other Ambulatory Visit: Payer: Self-pay | Admitting: Internal Medicine

## 2016-02-24 DIAGNOSIS — M810 Age-related osteoporosis without current pathological fracture: Secondary | ICD-10-CM

## 2016-02-24 MED ORDER — ATORVASTATIN CALCIUM 40 MG PO TABS: 40.0000 mg | ORAL_TABLET | Freq: Every day | ORAL | Status: DC

## 2016-02-24 MED ORDER — RALOXIFENE HCL 60 MG PO TABS
60.0000 mg | ORAL_TABLET | Freq: Every day | ORAL | Status: DC
Start: 2016-02-24 — End: 2016-04-12

## 2016-02-24 MED ORDER — CYCLOBENZAPRINE HCL 10 MG PO TABS: 10.0000 mg | ORAL_TABLET | Freq: Two times a day (BID) | ORAL | Status: DC | PRN

## 2016-02-24 MED ORDER — CLOBETASOL PROPIONATE 0.05 % EX CREA: 1.0000 "application " | TOPICAL_CREAM | Freq: Two times a day (BID) | CUTANEOUS | Status: DC

## 2016-02-24 NOTE — Telephone Encounter (Signed)
Rx refilled.

## 2016-02-24 NOTE — Telephone Encounter (Signed)
Caller name: Melinda Mckinney  Relation to pt: self Call back number: [phone] Pharmacy: [facility], [address]  Reason for call: Pt came in office requesting refill for atorvastatin (LIPITOR) 40 MG tablet, clobetasol cream (TEMOVATE) 0.05 %, cyclobenzaprine (FLEXERIL) 10 MG tablet and raloxifene (EVISTA) 60 MG tablet. Please advise.

## 2016-02-24 NOTE — Telephone Encounter (Signed)
Okay Flexeril # 40 and 2 refills. Others per gynecology

## 2016-02-24 NOTE — Telephone Encounter (Signed)
She can have a refill of her clobetasol and Evista

## 2016-02-24 NOTE — Telephone Encounter (Signed)
Pt is requesting refill on Clobetasol cream 0.05 %, Flexeril 10 mg, and Evista 60 mg.   Last OV: 12/28/2015 Last Fill on Clobetasol cream: 09/03/2015 #30g and 2RF Filled by Dr. Toney Rakes Last Fill on Flexeril: 05/16/2014 #40 and 2RF Filled by PCP Last Fill on Evista: 09/16/2014 #30 and 11RF Filled by Toney Rakes  -Last Bone density 09/2015 w/ Toney Rakes T-score: -2.7; repeat 2 years   Please advise.

## 2016-02-24 NOTE — Telephone Encounter (Signed)
Rx sent. Will forward request for Evista, and Clobetasol cream to Dr. Toney Rakes and [place].

## 2016-03-29 ENCOUNTER — Other Ambulatory Visit: Payer: Self-pay | Admitting: Internal Medicine

## 2016-04-12 ENCOUNTER — Other Ambulatory Visit: Payer: Self-pay | Admitting: Gynecology

## 2016-04-12 DIAGNOSIS — M810 Age-related osteoporosis without current pathological fracture: Secondary | ICD-10-CM

## 2016-04-12 NOTE — Telephone Encounter (Signed)
Needs annual Gyn exam in Jan

## 2016-06-29 ENCOUNTER — Ambulatory Visit (INDEPENDENT_AMBULATORY_CARE_PROVIDER_SITE_OTHER): Payer: Federal, State, Local not specified - PPO | Admitting: Internal Medicine

## 2016-06-29 ENCOUNTER — Encounter: Payer: Self-pay | Admitting: Internal Medicine

## 2016-06-29 VITALS — BP 126/74 | HR 84 | Temp 98.1°F | Resp 14 | Ht 63.0 in | Wt 239.1 lb

## 2016-06-29 DIAGNOSIS — E039 Hypothyroidism, unspecified: Secondary | ICD-10-CM | POA: Diagnosis not present

## 2016-06-29 DIAGNOSIS — I10 Essential (primary) hypertension: Secondary | ICD-10-CM

## 2016-06-29 LAB — BASIC METABOLIC PANEL
BUN: 14 mg/dL (ref 6–23)
CHLORIDE: 102 meq/L (ref 96–112)
CO2: 31 mEq/L (ref 19–32)
CREATININE: 0.78 mg/dL (ref 0.40–1.20)
Calcium: 10 mg/dL (ref 8.4–10.5)
GFR: 79.37 mL/min (ref >60.00)
Glucose, Bld: 100 mg/dL — ABNORMAL HIGH (ref 70–99)
Potassium: 4.2 mEq/L (ref 3.5–5.1)
Sodium: 140 mEq/L (ref 135–145)

## 2016-06-29 LAB — TSH: TSH: 0.52 u[IU]/mL (ref 0.35–4.50)

## 2016-06-29 MED ORDER — OMEPRAZOLE 20 MG PO CPDR: 20.0000 mg | DELAYED_RELEASE_CAPSULE | Freq: Every day | ORAL | 2 refills | Status: DC

## 2016-06-29 MED ORDER — ATORVASTATIN CALCIUM 40 MG PO TABS: 40.0000 mg | ORAL_TABLET | Freq: Every day | ORAL | 2 refills | Status: DC

## 2016-06-29 MED ORDER — ZOLPIDEM TARTRATE 5 MG PO TABS: 5.0000 mg | ORAL_TABLET | Freq: Every evening | ORAL | 2 refills | Status: DC | PRN

## 2016-06-29 MED ORDER — LISINOPRIL 10 MG PO TABS: 10.0000 mg | ORAL_TABLET | Freq: Every day | ORAL | 2 refills | Status: DC

## 2016-06-29 MED ORDER — METFORMIN HCL 500 MG PO TABS: 500.0000 mg | ORAL_TABLET | Freq: Two times a day (BID) | ORAL | 2 refills | Status: DC

## 2016-06-29 MED ORDER — HYDROCHLOROTHIAZIDE 50 MG PO TABS: 50.0000 mg | ORAL_TABLET | Freq: Every day | ORAL | 2 refills | Status: DC

## 2016-06-29 MED ORDER — ALLOPURINOL 100 MG PO TABS: 100.0000 mg | ORAL_TABLET | Freq: Every day | ORAL | 2 refills | Status: DC

## 2016-06-29 MED ORDER — DULOXETINE HCL 60 MG PO CPEP: 60.0000 mg | ORAL_CAPSULE | Freq: Every day | ORAL | 2 refills | Status: DC

## 2016-06-29 MED ORDER — LEVOTHYROXINE SODIUM 125 MCG PO TABS: 125.0000 ug | ORAL_TABLET | Freq: Every day | ORAL | 2 refills | Status: DC

## 2016-06-29 NOTE — Progress Notes (Signed)
Subjective:    Patient ID: Melinda Mckinney, female    DOB: [birth year], 60 y.o.   MRN: [id number]  DOS:  06/29/2016 Type of visit - description : Routine office visit Interval history: In general feeling great. Medication list reviewed, good compliance (except for Synthroid) without apparent side effects  No ambulatory blood pressures or blood sugars. Needs multiple refills.    Review of Systems Denies anxiety, depression. Aches and pains limited to knee DJD, fibromyalgia under better control with OTC supplements. See below.   Past Medical History:  Diagnosis Date  . Abnormal Pap smear of cervix   . Anxiety and depression   . CAD (coronary artery disease) ~2009   stent   . Cervical dysplasia   . DDD (degenerative disc disease), lumbar    Dr. Nelva Bush, recommends lumbar epidural steroid injections L5-S1 to the right  . Diabetes mellitus   . Fibromyalgia   . GERD (gastroesophageal reflux disease)   . High cholesterol   . Hypertension   . Insomnia 09/27/2013  . Lichen sclerosus    Vulvar area  . Osteoporosis   . S/p nephrectomy   . Thyroid disease   . Vitamin D deficiency     Past Surgical History:  Procedure Laterality Date  . CESAREAN SECTION     x 3   . esophagomyomectomy (heller)  2009  . GYNECOLOGIC CRYOSURGERY     X2  . HERNIA REPAIR     x 6 surgeries   . KNEE ARTHROSCOPY Right   . NEPHRECTOMY Right 1991   in [place], due to fibrosis and lithiasis    Social History   Social History  . Marital status: Married    Spouse name: N/A  . Number of children: 3  . Years of education: N/A   Occupational History  . stay home     Social History Main Topics  . Smoking status: Former Research scientist (life sciences)  . Smokeless tobacco: Not on file     Comment: quit ~ 2000 (2 ppd)  . Alcohol use 0.0 oz/week     Comment: socially   . Drug use: No  . Sexual activity: No     Comment: 1ST INTERCOURSE- 17, PARTNERS - 2   Other Topics Concern  . Not on file   Social History  Narrative   Original from [place]   3 children, 2 alive   Household --pt and husband         Medication List       Accurate as of 06/29/16  1:10 PM. Always use your most recent med list.          allopurinol 100 MG tablet Commonly known as:  ZYLOPRIM Take 1 tablet (100 mg total) by mouth daily.   aspirin 81 MG tablet Take 81 mg by mouth daily.   atorvastatin 40 MG tablet Commonly known as:  LIPITOR Take 1 tablet (40 mg total) by mouth at bedtime.   clobetasol cream 0.05 % Commonly known as:  TEMOVATE Apply 1 application topically 2 (two) times daily. Apply twice a week first week then once weekly as needed   cyclobenzaprine 10 MG tablet Commonly known as:  FLEXERIL Take 1 tablet (10 mg total) by mouth 2 (two) times daily as needed for muscle spasms.   DULoxetine 60 MG capsule Commonly known as:  CYMBALTA Take 1 capsule (60 mg total) by mouth daily.   hydrochlorothiazide 50 MG tablet Commonly known as:  HYDRODIURIL Take 1 tablet (50 mg total) by mouth  daily.   levothyroxine 125 MCG tablet Commonly known as:  SYNTHROID, LEVOTHROID Take 1 tablet (125 mcg total) by mouth daily before breakfast.   lisinopril 10 MG tablet Commonly known as:  PRINIVIL,ZESTRIL Take 1 tablet (10 mg total) by mouth daily.   metFORMIN 500 MG tablet Commonly known as:  GLUCOPHAGE Take 1 tablet (500 mg total) by mouth 2 (two) times daily with a meal.   omeprazole 20 MG capsule Commonly known as:  PRILOSEC Take 1 capsule (20 mg total) by mouth daily.   raloxifene 60 MG tablet Commonly known as:  EVISTA TAKE ONE TABLET BY MOUTH DAILY   Vitamin D3 5000 units Caps Take 1 capsule by mouth.   zolpidem 5 MG tablet Commonly known as:  AMBIEN Take 1 tablet (5 mg total) by mouth at bedtime as needed for sleep.          Objective:   Physical Exam BP 126/74 (BP Location: Right Arm, Patient Position: Sitting, Cuff Size: Normal)   Pulse 84   Temp 98.1 F (36.7 C) (Oral)   Resp  14   Ht 5\' 3"  (1.6 m)   Wt 239 lb 2 oz (108.5 kg)   SpO2 93%   BMI 42.36 kg/m  General:   Well developed, well nourished . NAD.  HEENT:  Normocephalic . Face symmetric, atraumatic Lungs:  CTA B Normal respiratory effort, no intercostal retractions, no accessory muscle use. Heart: RRR,  no murmur.  No pretibial edema bilaterally  Skin: Not pale. Not jaundice Neurologic:  alert & oriented X3.  Speech normal, gait is slightly limited by knee pain. Psych--  Cognition and judgment appear intact.  Cooperative with normal attention span and concentration.  Behavior appropriate. No anxious or depressed appearing. Excellent spirits today.      Assessment & Plan:   Assessment > DM  no neuropathy HTN High cholesterol Hypothyroidism Anxiety depression, insomnia Vitamin D deficiency, saw Dr. Cruzita Lederer x 1 b/c no respons to supplements, ?malabsortion GERD H/o achalasia, s/p Heller 2009 CAD , stent ~ 2009 SINGLE KIDNEY---S/p R  Nephrectomy ([place] 1991 due to stones/fibrosis) MSK: --Fibromyalgia --DJD  Knee pain R --Back pain -- s/p epidural 10-16 Osteoporosis -- per Gyn who  Rx Evista 09-2014  h/o cervical dysplasia   PLAN: DM: On metformin, last A1c excellent, no change. HTN: Under excellent control with lisinopril, HCTZ. Check a BMP Hypothyroidism: She went to [place], ran out of Synthroid, got a local prescription, dose is lower than her routine 125 g. Plan: Refill medicines, check a TSH, if it is elevated we'll simply continue the same 125 mcg and recheck in few months. Fibromyalgia: Taking OTC called "curcuma" and reports is feeling great with essentially no pain except DJD of the knee. On allopurinol, for years, not clear why she is taking it, apparently at some point was prescribed for FM. She is doing great, no apparent side effects from allopurinol. No change RTC 12-2016 CPX

## 2016-06-29 NOTE — Patient Instructions (Signed)
GO TO THE LAB : Get the blood work     GO TO THE FRONT DESK Schedule your next appointment for a  Physical by 12-2016

## 2016-06-29 NOTE — Progress Notes (Signed)
Pre visit review using our clinic review tool, if applicable. No additional management support is needed unless otherwise documented below in the visit note. 

## 2016-06-29 NOTE — Assessment & Plan Note (Signed)
DM: On metformin, last A1c excellent, no change. HTN: Under excellent control with lisinopril, HCTZ. Check a BMP Hypothyroidism: She went to [place], ran out of Synthroid, got a local prescription, dose is lower than her routine 125 g. Plan: Refill medicines, check a TSH, if it is elevated we'll simply continue the same 125 mcg and recheck in few months. Fibromyalgia: Taking OTC called "curcuma" and reports is feeling great with essentially no pain except DJD of the knee. On allopurinol, for years, not clear why she is taking it, apparently at some point was prescribed for FM. She is doing great, no apparent side effects from allopurinol. No change RTC 12-2016 CPX

## 2016-08-09 ENCOUNTER — Encounter: Payer: Self-pay | Admitting: Internal Medicine

## 2016-08-09 ENCOUNTER — Ambulatory Visit (INDEPENDENT_AMBULATORY_CARE_PROVIDER_SITE_OTHER): Payer: Federal, State, Local not specified - PPO | Admitting: Internal Medicine

## 2016-08-09 ENCOUNTER — Other Ambulatory Visit (INDEPENDENT_AMBULATORY_CARE_PROVIDER_SITE_OTHER): Payer: Federal, State, Local not specified - PPO

## 2016-08-09 ENCOUNTER — Other Ambulatory Visit: Payer: Self-pay | Admitting: Internal Medicine

## 2016-08-09 VITALS — BP 150/90 | HR 74 | Resp 20 | Wt 246.0 lb

## 2016-08-09 DIAGNOSIS — E559 Vitamin D deficiency, unspecified: Secondary | ICD-10-CM

## 2016-08-09 DIAGNOSIS — E039 Hypothyroidism, unspecified: Secondary | ICD-10-CM

## 2016-08-09 LAB — VITAMIN D 25 HYDROXY (VIT D DEFICIENCY, FRACTURES): VITD: 29.88 ng/mL — AB (ref 30.00–100.00)

## 2016-08-09 NOTE — Patient Instructions (Addendum)
Please continue Levothyroxine 125 mcg daily.  Take the thyroid hormone every day, with water, at least 30 minutes before breakfast, separated by at least 4 hours from: - acid reflux medications - calcium - iron - multivitamins  Continue vitamin D: - Vitamin D 5000 units daily  - Vitamin D in MVI once a day   Stop at [facility] office for labs.  Please return in 1 year.

## 2016-08-09 NOTE — Progress Notes (Signed)
Patient ID: Melinda Mckinney, female   DOB: [birth year], 60 y.o.   MRN: [id number]   HPI  Melinda Mckinney is a 60 y.o.-year-old female, returning for f/u for vitamin D deficiency and hypothyroidism. Last visit 1 year ago.   Vitamin D def  - dx in 08/2014 - No h/o hypocalcemia.  I reviewed pt's pertinent labs: Lab Results  Component Value Date   VD25OH 24 (L) 09/03/2015   VD25OH 29.63 (L) 08/07/2015   VD25OH 26.02 (L) 01/19/2015   VD25OH 17 (L) 12/12/2014   VD25OH 16 (L) 12/10/2014   VD25OH 24 (L) 09/12/2014   Pt was started on Ergocalciferol 50,000 units weekly in 09/2014 >> switched to:  Vitamin D 5000 units daily   Vitamin D in MVI once a day   Reviewed hx: A celiac panel was negative on 12/12/2014. She has abdominal pain, bloating, nausea and sometimes vomiting. She was seen by GI >> EGD >> had Sx for Acalasia. No h/o celiac ds.  Lab Results  Component Value Date   PTH 31 09/12/2014   CALCIUM 10.0 06/29/2016   CALCIUM 9.7 12/28/2015   CALCIUM 10.2 06/16/2015   CALCIUM 9.4 02/06/2015   CALCIUM 10.1 12/23/2014   CALCIUM 10.0 09/12/2014   CALCIUM 9.6 04/16/2014   CALCIUM 9.8 08/12/2013   CALCIUM 10.1 07/30/2012   CALCIUM 9.6 08/18/2011   She also has a h/o osteoporosis. I reviewed pt's DEXA scans: Date L1-L4 T score FN T score  09/04/2014 (Hologic)  -2.8 LFN: -1.1 RFN: -1.2  03/25/2007 (Lunar)  -2.6   Not on med for OP.  No fractures or falls.   + h/o kidney stones. R nephrectomy for hydronephrosis 2/2 kidney stone.  At that time, she also had surgery for incarcerated hernia - had several hernias  No h/o CKD. Last BUN/Cr: Lab Results  Component Value Date   BUN 14 06/29/2016   CREATININE 0.78 06/29/2016   She has hypothyroidism: - on LT4 125 mcg >> feels great on this dose!!! - in am - with water - eats 1h after LT4  - we separated PPI (4-5 h later or more) - we separated MVI (now after lunch) - no calcium, iron  Last TSH: Lab Results  Component Value  Date   TSH 0.52 06/29/2016   Lab Results  Component Value Date   FREET4 1.03 08/07/2015   No more pbs swallowing with food, no hoarseness, no SOB when lying down.  + FH with thyroid ds: Mother, daughter, and son. No ThyCa.    I reviewed her chart and she also has a history of prior nephrectomy, OA.   ROS: Constitutional: no weight gain/loss, no fatigue, no subjective hyperthermia/hypothermia Eyes: no blurry vision, no xerophthalmia ENT: no sore throat, no nodules palpated in throat, no dysphagia/odynophagia, no hoarseness Cardiovascular: no CP/SOB/palpitations/leg swelling Respiratory: no cough/SOB Gastrointestinal: no N/V/D/C Musculoskeletal: no muscle/joint aches Skin: no rashes Neurological: + tremors/no numbness/tingling/dizziness   I reviewed pt's medications, allergies, PMH, social hx, family hx, and changes were documented in the history of present illness. Otherwise, unchanged from my initial visit note.  Past Medical History:  Diagnosis Date  . Abnormal Pap smear of cervix   . Anxiety and depression   . CAD (coronary artery disease) ~2009   stent   . Cervical dysplasia   . DDD (degenerative disc disease), lumbar    Dr. Nelva Bush, recommends lumbar epidural steroid injections L5-S1 to the right  . Diabetes mellitus   . Fibromyalgia   . GERD (gastroesophageal reflux disease)   .  High cholesterol   . Hypertension   . Insomnia 09/27/2013  . Lichen sclerosus    Vulvar area  . Osteoporosis   . S/p nephrectomy   . Thyroid disease   . Vitamin D deficiency    Past Surgical History:  Procedure Laterality Date  . CESAREAN SECTION     x 3   . esophagomyomectomy (heller)  2009  . GYNECOLOGIC CRYOSURGERY     X2  . HERNIA REPAIR     x 6 surgeries   . KNEE ARTHROSCOPY Right   . NEPHRECTOMY Right 1991   in [place], due to fibrosis and lithiasis   Social History   Social History  . Marital status: Married    Spouse name: N/A  . Number of children: 3  . Years  of education: N/A   Occupational History  . stay home     Social History Main Topics  . Smoking status: Former Research scientist (life sciences)  . Smokeless tobacco: Not on file     Comment: quit ~ 2000 (2 ppd)  . Alcohol use 0.0 oz/week     Comment: socially   . Drug use: No  . Sexual activity: No     Comment: 1ST INTERCOURSE- 41, PARTNERS - 2   Other Topics Concern  . Not on file   Social History Narrative   Original from [place]   3 children, 2 alive   Household --pt and husband    Current Outpatient Prescriptions on File Prior to Visit  Medication Sig Dispense Refill  . allopurinol (ZYLOPRIM) 100 MG tablet Take 1 tablet (100 mg total) by mouth daily. 90 tablet 2  . aspirin 81 MG tablet Take 81 mg by mouth daily.    Marland Kitchen atorvastatin (LIPITOR) 40 MG tablet Take 1 tablet (40 mg total) by mouth at bedtime. 90 tablet 2  . Cholecalciferol (VITAMIN D3) 5000 UNITS CAPS Take 1 capsule by mouth.    . clobetasol cream (TEMOVATE) [id number] % Apply 1 application topically 2 (two) times daily. Apply twice a week first week then once weekly as needed 30 g 0  . cyclobenzaprine (FLEXERIL) 10 MG tablet Take 1 tablet (10 mg total) by mouth 2 (two) times daily as needed for muscle spasms. 40 tablet 2  . DULoxetine (CYMBALTA) 60 MG capsule Take 1 capsule (60 mg total) by mouth daily. 90 capsule 2  . hydrochlorothiazide (HYDRODIURIL) 50 MG tablet Take 1 tablet (50 mg total) by mouth daily. 90 tablet 2  . levothyroxine (SYNTHROID, LEVOTHROID) 125 MCG tablet Take 1 tablet (125 mcg total) by mouth daily before breakfast. 90 tablet 2  . lisinopril (PRINIVIL,ZESTRIL) 10 MG tablet Take 1 tablet (10 mg total) by mouth daily. 90 tablet 2  . metFORMIN (GLUCOPHAGE) 500 MG tablet Take 1 tablet (500 mg total) by mouth 2 (two) times daily with a meal. 180 tablet 2  . omeprazole (PRILOSEC) 20 MG capsule Take 1 capsule (20 mg total) by mouth daily. 90 capsule 2  . raloxifene (EVISTA) 60 MG tablet TAKE ONE TABLET BY MOUTH DAILY 30 tablet 5   . zolpidem (AMBIEN) 5 MG tablet Take 1 tablet (5 mg total) by mouth at bedtime as needed for sleep. 90 tablet 2   No current facility-administered medications on file prior to visit.    Allergies  Allergen Reactions  . Penicillins Other (See Comments)    Unknown reaction   Family History  Problem Relation Age of Onset  . Breast cancer Other     aunt   .  CAD Brother   . Lung cancer Mother     F and M  . Diabetes Father     F and mother   . Colon cancer Neg Hx    PE: BP (!) 150/90   Pulse 74   Resp 20   Wt 246 lb (111.6 kg)   SpO2 97%   BMI 43.58 kg/m  Body mass index is 43.58 kg/m. Wt Readings from Last 3 Encounters:  08/09/16 246 lb (111.6 kg)  06/29/16 239 lb 2 oz (108.5 kg)  12/28/15 239 lb 2 oz (108.5 kg)   Constitutional: overweight, in NAD.  Eyes: PERRLA, EOMI, no exophthalmos ENT: moist mucous membranes, no thyromegaly, no cervical lymphadenopathy Cardiovascular: RRR, No MRG Respiratory: CTA B Gastrointestinal: abdomen soft, NT, ND, BS+ Musculoskeletal: no deformities, strength intact in all 4 Skin: moist, warm, no rashes Neurological: + slight tremor with outstretched hands, DTR delayed in all 4  Assessment: 1. Vitamin D deficiency  2. Hypothyroidism  PLAN: 1. Patient had a low vitamin D in 09/12/2014, at 24, however, this decreased further despite starting ergocalciferol 50,000 units once a week. She is now on:  Vitamin D 5000 units daily   Vitamin D in MVI once a day  - Reviewed chart: there is no apparent reason for malabsorption: Celiac workup negative, no h/o malabsorption (has a history of Barrett's esophagus with surveillance EGDs, also had achalasia surgery.  - Reviewed together last vit D: 24, still low - will check her Vitamin D today - continue her current supplementation, but may need an increase in dose - I will see the patient back in 1 year  2. Hypothyroidism - TSH normal now that she moved MVI and PPIs >4h LT4  - will continue  with this  - I advised her to take the thyroid hormone every day, with water, >30 minutes before breakfast, separated by >4 hours from acid reflux medications, calcium, iron, multivitamins. She is taking this correctly now. - we will recheck her TFTs at next visit  Component     Latest Ref Rng & Units 08/09/2016  VITD     30.00 - 100.00 ng/mL 29.88 (L)   Vitamin D is still lower than the lower limit of normal. We'll increase her daily supplement dose to 7000 units, along with the multivitamins.  CC: Dr. Uvaldo Rising  Philemon Kingdom, MD PhD [facility] Endocrinology

## 2016-09-08 ENCOUNTER — Ambulatory Visit (INDEPENDENT_AMBULATORY_CARE_PROVIDER_SITE_OTHER): Payer: Federal, State, Local not specified - PPO | Admitting: Gynecology

## 2016-09-08 ENCOUNTER — Encounter: Payer: Self-pay | Admitting: Gynecology

## 2016-09-08 VITALS — BP 128/80 | Ht 63.0 in | Wt 245.0 lb

## 2016-09-08 DIAGNOSIS — N905 Atrophy of vulva: Secondary | ICD-10-CM

## 2016-09-08 DIAGNOSIS — Z01411 Encounter for gynecological examination (general) (routine) with abnormal findings: Secondary | ICD-10-CM | POA: Diagnosis not present

## 2016-09-08 DIAGNOSIS — G4700 Insomnia, unspecified: Secondary | ICD-10-CM | POA: Diagnosis not present

## 2016-09-08 DIAGNOSIS — M8580 Other specified disorders of bone density and structure, unspecified site: Secondary | ICD-10-CM | POA: Diagnosis not present

## 2016-09-08 DIAGNOSIS — E559 Vitamin D deficiency, unspecified: Secondary | ICD-10-CM

## 2016-09-08 MED ORDER — RALOXIFENE HCL 60 MG PO TABS: 60.0000 mg | ORAL_TABLET | Freq: Every day | ORAL | 11 refills | Status: DC

## 2016-09-08 MED ORDER — ZOLPIDEM TARTRATE 5 MG PO TABS: 5.0000 mg | ORAL_TABLET | Freq: Every evening | ORAL | 2 refills | Status: DC | PRN

## 2016-09-08 MED ORDER — CLOBETASOL PROPIONATE 0.05 % EX CREA: 1.0000 "application " | TOPICAL_CREAM | Freq: Two times a day (BID) | CUTANEOUS | 5 refills | Status: DC

## 2016-09-08 NOTE — Progress Notes (Signed)
Melinda Mckinney 1955/10/18 [id number]   History:    61 y.o.  for annual gyn exam with no complaints today. Dr. Renne Crigler has been monitoring her hypothyroidism as well as her vitamin D deficiency for which she is currently on 50,000 units daily but has been over 6 months as her vitamin D level was checked. Dr. Larose Kells is her PCP who is been doing her blood work and treating her for hypertension hyperlipidemia. Patient states that her flu vaccine and shingles vaccine are all up-to-date as well. As a result of her vulvar atrophy contributing to bulbar pruritus she has done well on clobetasol 0.05% which she applies 2-3 times a week as needed and is requesting a refill. She also has suffer from insomnia and it occasionally will take Ambien 10 mg at bedtime for which she was requesting a refill as well.  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 Her bone density study in January 2016 before her Evista was started at indicated that her lowest T score was -2.8 at the [facility] spine. Her follow-up bone density study after 1 year of treatment with Evista in 2017 demonstrated that her bone mineralization was stable. She will not need a bone density study total 2019.  Patient had a colonoscopy in 2014 and benign polyps were removed and then in 2017 her colonoscopy was benign and she is on a 3 year recall.  Past medical history,surgical history, family history and social history were all reviewed and documented in the EPIC chart.  Gynecologic History No LMP recorded (approximate). Patient is postmenopausal. Contraception: post menopausal status Last Pap: 2016. Results were: normal Last mammogram: 2017. Results were: normal  Obstetric History OB History  Gravida Para Term Preterm AB Living  4 2     2 2   SAB TAB Ectopic Multiple Live Births  2            # Outcome Date GA Lbr Len/2nd Weight Sex Delivery Anes PTL Lv  4 SAB           3 SAB           2 Para            1 Para                ROS: A ROS was performed and pertinent positives and negatives are included in the history.  GENERAL: No fevers or chills. HEENT: No change in vision, no earache, sore throat or sinus congestion. NECK: No pain or stiffness. CARDIOVASCULAR: No chest pain or pressure. No palpitations. PULMONARY: No shortness of breath, cough or wheeze. GASTROINTESTINAL: No abdominal pain, nausea, vomiting or diarrhea, melena or bright red blood per rectum. GENITOURINARY: No urinary frequency, urgency, hesitancy or dysuria. MUSCULOSKELETAL: No joint or muscle pain, no back pain, no recent trauma. DERMATOLOGIC: No rash, no itching, no lesions. ENDOCRINE: No polyuria, polydipsia, no heat or cold intolerance. No recent change in weight. HEMATOLOGICAL: No anemia or easy bruising or bleeding. NEUROLOGIC: No headache, seizures, numbness, tingling or weakness. PSYCHIATRIC: No depression, no loss of interest in normal activity or change in sleep pattern.     Exam: chaperone present  BP 128/80   Ht 5\' 3"  (1.6 m)   Wt 245 lb (111.1 kg)   LMP  (Approximate)   BMI 43.40 kg/m   Body mass index is 43.4 kg/m.  General appearance : Well developed well nourished female. No acute distress HEENT: Eyes: no  retinal hemorrhage or exudates,  Neck supple, trachea midline, no carotid bruits, no thyroidmegaly Lungs: Clear to auscultation, no rhonchi or wheezes, or rib retractions  Heart: Regular rate and rhythm, no murmurs or gallops Breast:Examined in sitting and supine position were symmetrical in appearance, no palpable masses or tenderness,  no skin retraction, no nipple inversion, no nipple discharge, no skin discoloration, no axillary or supraclavicular lymphadenopathy Abdomen: no palpable masses or tenderness, no rebound or guarding Extremities: no edema or skin discoloration or tenderness  Pelvic:  Bartholin, Urethra, Skene Glands: Within normal limits             Vagina: No gross lesions or  discharge  Cervix: No gross lesions or discharge  Uterus  anteverted, normal size, shape and consistency, non-tender and mobile  Adnexa  Without masses or tenderness  Anus and perineum  normal   Rectovaginal  normal sphincter tone without palpated masses or tenderness             Hemoccult colonoscopy next year     Assessment/Plan:  61 y.o. female for annual exam responds well for her vulvar pruritus with clobetasol 0.05% which she applies 2-3 times a week externally perception refill provided. Ambien 10 mg 1 by mouth daily at bedtime when necessary for insomnia was provided. Since his been over 6 months is her vitamin D level was checked were 1 to check her vitamin D level because of her history vitamin D deficiency. She will continue to follow with her endocrinologist on a regular basis as well as her PCP. Pap smear not indicated this year. She is due for mammogram later this year. We discussed importance of calcium vitamin D and weightbearing exercises for osteoporosis prevention.   Terrance Mass MD, 11:19 AM 09/08/2016

## 2016-09-09 LAB — VITAMIN D 25 HYDROXY (VIT D DEFICIENCY, FRACTURES): VIT D 25 HYDROXY: 40 ng/mL (ref 30–100)

## 2016-09-16 DIAGNOSIS — Z1231 Encounter for screening mammogram for malignant neoplasm of breast: Secondary | ICD-10-CM | POA: Diagnosis not present

## 2016-09-16 DIAGNOSIS — Z803 Family history of malignant neoplasm of breast: Secondary | ICD-10-CM | POA: Diagnosis not present

## 2016-09-16 LAB — HM MAMMOGRAPHY

## 2016-09-21 DIAGNOSIS — E119 Type 2 diabetes mellitus without complications: Secondary | ICD-10-CM | POA: Diagnosis not present

## 2016-09-21 DIAGNOSIS — E0590 Thyrotoxicosis, unspecified without thyrotoxic crisis or storm: Secondary | ICD-10-CM | POA: Diagnosis not present

## 2016-09-21 DIAGNOSIS — K2970 Gastritis, unspecified, without bleeding: Secondary | ICD-10-CM | POA: Diagnosis not present

## 2016-09-21 DIAGNOSIS — M797 Fibromyalgia: Secondary | ICD-10-CM | POA: Diagnosis not present

## 2016-09-28 ENCOUNTER — Encounter: Payer: Self-pay | Admitting: Internal Medicine

## 2016-10-07 DIAGNOSIS — M79604 Pain in right leg: Secondary | ICD-10-CM | POA: Diagnosis not present

## 2016-10-07 DIAGNOSIS — E119 Type 2 diabetes mellitus without complications: Secondary | ICD-10-CM | POA: Diagnosis not present

## 2016-10-07 DIAGNOSIS — E069 Thyroiditis, unspecified: Secondary | ICD-10-CM | POA: Diagnosis not present

## 2016-10-07 DIAGNOSIS — K2970 Gastritis, unspecified, without bleeding: Secondary | ICD-10-CM | POA: Diagnosis not present

## 2016-12-28 ENCOUNTER — Encounter: Payer: Self-pay | Admitting: Gynecology

## 2017-01-05 ENCOUNTER — Encounter: Payer: Federal, State, Local not specified - PPO | Admitting: Internal Medicine

## 2017-01-06 ENCOUNTER — Ambulatory Visit (INDEPENDENT_AMBULATORY_CARE_PROVIDER_SITE_OTHER): Payer: Federal, State, Local not specified - PPO | Admitting: Internal Medicine

## 2017-01-06 ENCOUNTER — Encounter: Payer: Self-pay | Admitting: Internal Medicine

## 2017-01-06 VITALS — BP 126/72 | HR 81 | Temp 98.3°F | Resp 14 | Ht 63.0 in | Wt 229.4 lb

## 2017-01-06 DIAGNOSIS — R739 Hyperglycemia, unspecified: Secondary | ICD-10-CM

## 2017-01-06 DIAGNOSIS — M79603 Pain in arm, unspecified: Secondary | ICD-10-CM

## 2017-01-06 DIAGNOSIS — Z0000 Encounter for general adult medical examination without abnormal findings: Secondary | ICD-10-CM | POA: Diagnosis not present

## 2017-01-06 LAB — COMPREHENSIVE METABOLIC PANEL
ALBUMIN: 4.3 g/dL (ref 3.5–5.2)
ALT: 19 U/L (ref 0–35)
AST: 20 U/L (ref 0–37)
Alkaline Phosphatase: 72 U/L (ref 39–117)
BILIRUBIN TOTAL: 0.4 mg/dL (ref 0.2–1.2)
BUN: 21 mg/dL (ref 6–23)
CALCIUM: 10 mg/dL (ref 8.4–10.5)
CO2: 30 mEq/L (ref 19–32)
Chloride: 103 mEq/L (ref 96–112)
Creatinine, Ser: 0.84 mg/dL (ref 0.40–1.20)
GFR: 72.74 mL/min (ref >60.00)
Glucose, Bld: 110 mg/dL — ABNORMAL HIGH (ref 70–99)
Potassium: 3.8 mEq/L (ref 3.5–5.1)
Sodium: 141 mEq/L (ref 135–145)
Total Protein: 7.1 g/dL (ref 6.0–8.3)

## 2017-01-06 LAB — HEMOGLOBIN A1C: Hgb A1c MFr Bld: 5.6 % (ref 4.6–6.5)

## 2017-01-06 LAB — CBC WITH DIFFERENTIAL/PLATELET
BASOS PCT: 1.3 % (ref 0.0–3.0)
Basophils Absolute: 0.1 10*3/uL (ref 0.0–0.1)
Eosinophils Absolute: 0.1 10*3/uL (ref 0.0–0.7)
Eosinophils Relative: 1.7 % (ref 0.0–5.0)
HCT: 38.9 % (ref 36.0–46.0)
Hemoglobin: 12.7 g/dL (ref 12.0–15.0)
LYMPHS PCT: 21.8 % (ref 12.0–46.0)
Lymphs Abs: 1.7 10*3/uL (ref 0.7–4.0)
MCHC: 32.8 g/dL (ref 30.0–36.0)
MCV: 92.6 fl (ref 78.0–100.0)
MONOS PCT: 5.1 % (ref 3.0–12.0)
Monocytes Absolute: 0.4 10*3/uL (ref 0.1–1.0)
NEUTROS ABS: 5.3 10*3/uL (ref 1.4–7.7)
Neutrophils Relative %: 70.1 % (ref 43.0–77.0)
PLATELETS: 298 10*3/uL (ref 150.0–400.0)
RBC: 4.2 Mil/uL (ref 3.87–5.11)
RDW: 13.8 % (ref 11.5–15.5)
WBC: 7.6 10*3/uL (ref 4.0–10.5)

## 2017-01-06 LAB — CK: Total CK: 52 U/L (ref 7–177)

## 2017-01-06 LAB — LIPID PANEL
CHOLESTEROL: 176 mg/dL (ref 0–200)
HDL: 81.5 mg/dL (ref >39.00)
LDL Cholesterol: 74 mg/dL (ref 0–99)
NonHDL: 94.13
Total CHOL/HDL Ratio: 2
Triglycerides: 100 mg/dL (ref 0.0–149.0)
VLDL: 20 mg/dL (ref 0.0–40.0)

## 2017-01-06 LAB — SEDIMENTATION RATE: Sed Rate: 12 mm/hr (ref 0–30)

## 2017-01-06 LAB — TSH: TSH: 2.02 u[IU]/mL (ref 0.35–4.50)

## 2017-01-06 MED ORDER — CLOBETASOL PROPIONATE 0.05 % EX CREA: 1.0000 "application " | TOPICAL_CREAM | Freq: Two times a day (BID) | CUTANEOUS | 1 refills | Status: DC

## 2017-01-06 NOTE — Assessment & Plan Note (Addendum)
--  Td: 2015; pnm shot 2015; prevnar 12-28-15; zostavax 12-28-2015 ; shingrex discussed   --Female care:  MMG 09-2016 (-) Last gyn visit 08-2016 -- CCS: 11/21/2013: Cscope;  repeated cscope ~ 11-2015: repeat 2 years --Labs: CMP, FLP, CBC, A1c, TSH. Total CK and sedimentation rate -- Diet  : Is doing great. Exercise is still limited

## 2017-01-06 NOTE — Progress Notes (Signed)
Subjective:    Patient ID: Melinda Mckinney, female    DOB: Jun 05, 1956, 61 y.o.   MRN: [id number]  DOS:  01/06/2017 Type of visit - description : cpx Interval history: States she is doing great, doing food  portion control and has lost significant amount of weight.  Wt Readings from Last 3 Encounters:  01/06/17 229 lb 6 oz (104 kg)  09/08/16 245 lb (111.1 kg)  08/09/16 246 lb (111.6 kg)    Review of Systems  Reports right arm pain from the shoulder down, "is like myalgia pain" throughout the arm, she reports + Swelling, denies upper or lower extremity paresthesias, no gait difficulty, she is convinced this is a episode of FM. Denies neck pain, shoulder injury, fever chills. + Weight loss due to a better diet. No diplopia, slurred speech, facial numbness. She also has right knee pain related to DJD.   Other than above, a 14 point review of systems is negative     Past Medical History:  Diagnosis Date  . Abnormal Pap smear of cervix   . Anxiety and depression   . CAD (coronary artery disease) ~2009   stent   . Cervical dysplasia   . DDD (degenerative disc disease), lumbar    Dr. Nelva Bush, recommends lumbar epidural steroid injections L5-S1 to the right  . Fibromyalgia   . GERD (gastroesophageal reflux disease)   . High cholesterol   . Hypertension   . Insomnia 09/27/2013  . Lichen sclerosus    Vulvar area  . Osteoporosis   . S/p nephrectomy   . Thyroid disease   . Vitamin D deficiency     Past Surgical History:  Procedure Laterality Date  . CESAREAN SECTION     x 3   . esophagomyomectomy (heller)  2009  . GYNECOLOGIC CRYOSURGERY     X2  . HERNIA REPAIR     x 6 surgeries   . KNEE ARTHROSCOPY Right   . NEPHRECTOMY Right 1991   in [place], due to fibrosis and lithiasis    Social History   Social History  . Marital status: Married    Spouse name: N/A  . Number of children: 3  . Years of education: N/A   Occupational History  . stay home     Social  History Main Topics  . Smoking status: Former Research scientist (life sciences)  . Smokeless tobacco: Never Used     Comment: quit ~ 2000 (2 ppd)  . Alcohol use 0.0 oz/week     Comment: socially   . Drug use: No  . Sexual activity: No     Comment: 1ST INTERCOURSE- 31, PARTNERS - 2   Other Topics Concern  . Not on file   Social History Narrative   Original from [place]   3 children, 2 alive   Household --pt and husband      Family History  Problem Relation Age of Onset  . Breast cancer Other        aunt   . CAD Brother   . Lung cancer Mother        F and M  . Diabetes Father        F and mother   . Colon cancer Neg Hx      Allergies as of 01/06/2017      Reactions   Penicillins Other (See Comments)   Unknown reaction      Medication List       Accurate as of 01/06/17 11:59 PM. Always use  your most recent med list.          allopurinol 100 MG tablet Commonly known as:  ZYLOPRIM Take 1 tablet (100 mg total) by mouth daily.   aspirin 81 MG tablet Take 81 mg by mouth daily.   atorvastatin 40 MG tablet Commonly known as:  LIPITOR Take 1 tablet (40 mg total) by mouth at bedtime.   clobetasol cream 0.05 % Commonly known as:  TEMOVATE Apply 1 application topically 2 (two) times daily. Apply twice a week first week then once weekly as needed   cyclobenzaprine 10 MG tablet Commonly known as:  FLEXERIL Take 1 tablet (10 mg total) by mouth 2 (two) times daily as needed for muscle spasms.   DULoxetine 60 MG capsule Commonly known as:  CYMBALTA Take 1 capsule (60 mg total) by mouth daily.   hydrochlorothiazide 50 MG tablet Commonly known as:  HYDRODIURIL Take 1 tablet (50 mg total) by mouth daily.   levothyroxine 125 MCG tablet Commonly known as:  SYNTHROID, LEVOTHROID Take 1 tablet (125 mcg total) by mouth daily before breakfast.   lisinopril 10 MG tablet Commonly known as:  PRINIVIL,ZESTRIL Take 1 tablet (10 mg total) by mouth daily.   metFORMIN 500 MG tablet Commonly known  as:  GLUCOPHAGE Take 1 tablet (500 mg total) by mouth 2 (two) times daily with a meal.   omeprazole 20 MG capsule Commonly known as:  PRILOSEC Take 1 capsule (20 mg total) by mouth daily.   raloxifene 60 MG tablet Commonly known as:  EVISTA Take 1 tablet (60 mg total) by mouth daily.   Vitamin D3 5000 units Caps Take 1 capsule by mouth.   zolpidem 5 MG tablet Commonly known as:  AMBIEN Take 1 tablet (5 mg total) by mouth at bedtime as needed for sleep.          Objective:   Physical Exam BP 126/72 (BP Location: Left Arm, Patient Position: Sitting, Cuff Size: Normal)   Pulse 81   Temp 98.3 F (36.8 C) (Oral)   Resp 14   Ht 5\' 3"  (1.6 m)   Wt 229 lb 6 oz (104 kg)   SpO2 97%   BMI 40.63 kg/m   General:   Well developed, well nourished . NAD.  Neck: No  thyromegaly . No TTP at the cervical spine, range of motion normal HEENT:  Normocephalic . Face symmetric, atraumatic Lungs:  CTA B Normal respiratory effort, no intercostal retractions, no accessory muscle use. Heart: RRR,  no murmur.  No pretibial edema bilaterally  Abdomen:  Not distended, soft, non-tender. No rebound or rigidity.   MSK: Peri-shoulder area definitely TTP without redness or warmness. Similar findings at the forearm. Otherwise there is no objective swelling, redness. She does have decreased range of motion of the right shoulder Skin: Exposed areas without rash. Not pale. Not jaundice Neurologic:  alert & oriented X3.  Speech normal, gait somewhat limited by DJD DTRs symmetric. Motor: Slightly decrease R UE>LE. EOMI, face symmetric. Psych: Cognition and judgment appear intact.  Cooperative with normal attention span and concentration.  Behavior appropriate. No anxious or depressed appearing.    Assessment & Plan:   Assessment   Hyperglycemia HTN High cholesterol Hypothyroidism Anxiety depression, insomnia Vitamin D deficiency, saw Dr. Cruzita Lederer 07-2016, rtc 1 year, celiac dz test (-),  unlikely  Malabsortion; cont supplements  GERD H/o achalasia, s/p Heller 2009 CAD , stent ~ 2009 SINGLE KIDNEY---S/p R  Nephrectomy ([place] 1991 due to stones/fibrosis) MSK: --Fibromyalgia --DJD  Knee pain R --Back pain -- s/p epidural 10-16 Osteoporosis -- per Gyn who  Rx Evista 09-2014  h/o cervical dysplasia Derm: vulvar pruritus, temovate prn ok per gyn  PLAN: Here for CPX, overall she states she is doing great Hyperglycemia, HTN, high cholesterol, hypothyroidism: Diet has significantly improved, she is losing weight, continue same medications, checking labs. Right arm pain: Severe right arm pain, patient is convinced is fibromyalgia, on exam there is no objective evidence of inflammation, she does have a  decreased R shoulder ROM and some R sided motor weakness without other neurological findings or neurological sx. Will check a total CK and sedimentation rate. Recommend to see ortho again, Dr. Linda Hedges. RF Temovate RTC 6 months

## 2017-01-06 NOTE — Patient Instructions (Signed)
GO TO THE LAB : Get the blood work     GO TO THE FRONT DESK Schedule your next appointment for a  routine checkup in 6 months  

## 2017-01-06 NOTE — Progress Notes (Signed)
Pre visit review using our clinic review tool, if applicable. No additional management support is needed unless otherwise documented below in the visit note. 

## 2017-01-08 NOTE — Assessment & Plan Note (Signed)
Here for CPX, overall she states she is doing great Hyperglycemia, HTN, high cholesterol, hypothyroidism: Diet has significantly improved, she is losing weight, continue same medications, checking labs. Right arm pain: Severe right arm pain, patient is convinced is fibromyalgia, on exam there is no objective evidence of inflammation, she does have a  decreased R shoulder ROM and some R sided motor weakness without other neurological findings or neurological sx. Will check a total CK and sedimentation rate. Recommend to see ortho again, Dr. Linda Hedges. RF Temovate RTC 6 months

## 2017-01-24 DIAGNOSIS — G8929 Other chronic pain: Secondary | ICD-10-CM | POA: Diagnosis not present

## 2017-01-24 DIAGNOSIS — M25511 Pain in right shoulder: Secondary | ICD-10-CM | POA: Diagnosis not present

## 2017-02-13 DIAGNOSIS — K080 Exfoliation of teeth due to systemic causes: Secondary | ICD-10-CM | POA: Diagnosis not present

## 2017-03-07 DIAGNOSIS — H524 Presbyopia: Secondary | ICD-10-CM | POA: Diagnosis not present

## 2017-03-07 DIAGNOSIS — E119 Type 2 diabetes mellitus without complications: Secondary | ICD-10-CM | POA: Diagnosis not present

## 2017-03-07 DIAGNOSIS — H2513 Age-related nuclear cataract, bilateral: Secondary | ICD-10-CM | POA: Diagnosis not present

## 2017-03-07 DIAGNOSIS — G510 Bell's palsy: Secondary | ICD-10-CM | POA: Diagnosis not present

## 2017-03-24 DIAGNOSIS — M79604 Pain in right leg: Secondary | ICD-10-CM | POA: Diagnosis not present

## 2017-03-24 DIAGNOSIS — E069 Thyroiditis, unspecified: Secondary | ICD-10-CM | POA: Diagnosis not present

## 2017-03-24 DIAGNOSIS — E119 Type 2 diabetes mellitus without complications: Secondary | ICD-10-CM | POA: Diagnosis not present

## 2017-03-24 DIAGNOSIS — K2970 Gastritis, unspecified, without bleeding: Secondary | ICD-10-CM | POA: Diagnosis not present

## 2017-04-12 DIAGNOSIS — E119 Type 2 diabetes mellitus without complications: Secondary | ICD-10-CM | POA: Diagnosis not present

## 2017-04-12 DIAGNOSIS — E069 Thyroiditis, unspecified: Secondary | ICD-10-CM | POA: Diagnosis not present

## 2017-04-12 DIAGNOSIS — M79604 Pain in right leg: Secondary | ICD-10-CM | POA: Diagnosis not present

## 2017-04-12 DIAGNOSIS — K2970 Gastritis, unspecified, without bleeding: Secondary | ICD-10-CM | POA: Diagnosis not present

## 2017-04-25 ENCOUNTER — Encounter: Payer: Self-pay | Admitting: Internal Medicine

## 2017-04-25 ENCOUNTER — Ambulatory Visit (INDEPENDENT_AMBULATORY_CARE_PROVIDER_SITE_OTHER): Payer: Federal, State, Local not specified - PPO | Admitting: Internal Medicine

## 2017-04-25 VITALS — BP 128/74 | HR 91 | Temp 98.8°F | Resp 14 | Ht 63.0 in | Wt 222.1 lb

## 2017-04-25 DIAGNOSIS — J40 Bronchitis, not specified as acute or chronic: Secondary | ICD-10-CM

## 2017-04-25 DIAGNOSIS — J9801 Acute bronchospasm: Secondary | ICD-10-CM | POA: Diagnosis not present

## 2017-04-25 MED ORDER — AZITHROMYCIN 250 MG PO TABS: ORAL_TABLET | ORAL | 0 refills | Status: DC

## 2017-04-25 MED ORDER — ALBUTEROL SULFATE HFA 108 (90 BASE) MCG/ACT IN AERS: 2.0000 | INHALATION_SPRAY | Freq: Four times a day (QID) | RESPIRATORY_TRACT | 1 refills | Status: DC | PRN

## 2017-04-25 MED ORDER — PREDNISONE 10 MG PO TABS: ORAL_TABLET | ORAL | 0 refills | Status: DC

## 2017-04-25 NOTE — Progress Notes (Signed)
Pre visit review using our clinic review tool, if applicable. No additional management support is needed unless otherwise documented below in the visit note. 

## 2017-04-25 NOTE — Progress Notes (Signed)
Subjective:    Patient ID: Melinda Mckinney, female    DOB: 09-01-1955, 61 y.o.   MRN: [id number]  DOS:  04/25/2017 Type of visit - description : acute Interval history: Symptoms started 2 weeks ago with simple URI, a week ago she took a trip to [place] and over there, sx got worse: Severe cough, abundant dark sputum production. Due to severe cough she also had nausea and vomiting. Went to urgent care there, good a nebulization, had mild improvement . Symptoms are about the same at this point.   Review of Systems No fever chills Denies sinus pain or congestion No chest pain, she does have some wheezing. No edema.  Past Medical History:  Diagnosis Date  . Abnormal Pap smear of cervix   . Anxiety and depression   . CAD (coronary artery disease) ~2009   stent   . Cervical dysplasia   . DDD (degenerative disc disease), lumbar    Dr. Nelva Bush, recommends lumbar epidural steroid injections L5-S1 to the right  . Fibromyalgia   . GERD (gastroesophageal reflux disease)   . High cholesterol   . Hypertension   . Insomnia 09/27/2013  . Lichen sclerosus    Vulvar area  . Osteoporosis   . S/p nephrectomy   . Thyroid disease   . Vitamin D deficiency     Past Surgical History:  Procedure Laterality Date  . CESAREAN SECTION     x 3   . esophagomyomectomy (heller)  2009  . GYNECOLOGIC CRYOSURGERY     X2  . HERNIA REPAIR     x 6 surgeries   . KNEE ARTHROSCOPY Right   . NEPHRECTOMY Right 1991   in [place], due to fibrosis and lithiasis    Social History   Social History  . Marital status: Married    Spouse name: N/A  . Number of children: 3  . Years of education: N/A   Occupational History  . stay home     Social History Main Topics  . Smoking status: Former Research scientist (life sciences)  . Smokeless tobacco: Never Used     Comment: quit ~ 2000 (2 ppd)  . Alcohol use 0.0 oz/week     Comment: socially   . Drug use: No  . Sexual activity: No     Comment: 1ST INTERCOURSE- 69,  PARTNERS - 2   Other Topics Concern  . Not on file   Social History Narrative   Original from [place]   3 children, 2 alive   Household --pt and husband       Allergies as of 04/25/2017      Reactions   Penicillins Other (See Comments)   Unknown reaction      Medication List       Accurate as of 04/25/17 11:59 PM. Always use your most recent med list.          albuterol 108 (90 Base) MCG/ACT inhaler Commonly known as:  VENTOLIN HFA Inhale 2 puffs into the lungs every 6 (six) hours as needed for wheezing or shortness of breath.   allopurinol 100 MG tablet Commonly known as:  ZYLOPRIM Take 1 tablet (100 mg total) by mouth daily.   aspirin 81 MG tablet Take 81 mg by mouth daily.   atorvastatin 40 MG tablet Commonly known as:  LIPITOR Take 1 tablet (40 mg total) by mouth at bedtime.   azithromycin 250 MG tablet Commonly known as:  ZITHROMAX Z-PAK 2 tabs a day the first day, then  1 tab a day x 4 days   clobetasol cream 0.05 % Commonly known as:  TEMOVATE Apply 1 application topically 2 (two) times daily. Apply twice a week first week then once weekly as needed   cyclobenzaprine 10 MG tablet Commonly known as:  FLEXERIL Take 1 tablet (10 mg total) by mouth 2 (two) times daily as needed for muscle spasms.   DULoxetine 60 MG capsule Commonly known as:  CYMBALTA Take 1 capsule (60 mg total) by mouth daily.   hydrochlorothiazide 50 MG tablet Commonly known as:  HYDRODIURIL Take 1 tablet (50 mg total) by mouth daily.   levothyroxine 125 MCG tablet Commonly known as:  SYNTHROID, LEVOTHROID Take 1 tablet (125 mcg total) by mouth daily before breakfast.   lisinopril 10 MG tablet Commonly known as:  PRINIVIL,ZESTRIL Take 1 tablet (10 mg total) by mouth daily.   metFORMIN 500 MG tablet Commonly known as:  GLUCOPHAGE Take 1 tablet (500 mg total) by mouth 2 (two) times daily with a meal.   omeprazole 20 MG capsule Commonly known as:  PRILOSEC Take 1 capsule  (20 mg total) by mouth daily.   predniSONE 10 MG tablet Commonly known as:  DELTASONE 4 tablets x 2 days, 3 tabs x 2 days, 2 tabs x 2 days, 1 tab x 2 days   raloxifene 60 MG tablet Commonly known as:  EVISTA Take 1 tablet (60 mg total) by mouth daily.   Vitamin D3 5000 units Caps Take 1 capsule by mouth.   zolpidem 5 MG tablet Commonly known as:  AMBIEN Take 1 tablet (5 mg total) by mouth at bedtime as needed for sleep.            Discharge Care Instructions        Start     Ordered   04/25/17 0000  azithromycin (ZITHROMAX Z-PAK) 250 MG tablet     04/25/17 0942   04/25/17 0000  predniSONE (DELTASONE) 10 MG tablet     04/25/17 0942   04/25/17 0000  albuterol (VENTOLIN HFA) 108 (90 Base) MCG/ACT inhaler  Every 6 hours PRN     04/25/17 0942         Objective:   Physical Exam BP 128/74 (BP Location: Right Arm, Patient Position: Sitting, Cuff Size: Normal)   Pulse 91   Temp 98.8 F (37.1 C) (Oral)   Resp 14   Ht 5\' 3"  (1.6 m)   Wt 222 lb 2 oz (100.8 kg)   SpO2 96%   BMI 39.35 kg/m  General:   Well developed, well nourished . NAD.  HEENT:  Normocephalic . Face symmetric, atraumatic. TMs normal. Nose slightly congested, sinuses no TTP. Throat symmetric Lungs:  Few rhonchi, slightly increased expiratory time. Normal respiratory effort, no intercostal retractions, no accessory muscle use. Heart: RRR,  no murmur.  No pretibial edema bilaterally  Skin: Not pale. Not jaundice Neurologic:  alert & oriented X3.  Speech normal, gait appropriate for age and unassisted Psych--  Cognition and judgment appear intact.  Cooperative with normal attention span and concentration.  Behavior appropriate. No anxious or depressed appearing.      Assessment & Plan:  Assessment   Hyperglycemia HTN High cholesterol Hypothyroidism Anxiety depression, insomnia Vitamin D deficiency, saw Dr. Cruzita Lederer 07-2016, rtc 1 year, celiac dz test (-), unlikely  Malabsortion; cont  supplements  GERD H/o achalasia, s/p Heller 2009 CAD , stent ~ 2009 SINGLE KIDNEY---S/p R  Nephrectomy ([place] 1991 due to stones/fibrosis) MSK: --Fibromyalgia --DJD  Knee pain R --Back pain -- s/p epidural 10-16 Osteoporosis -- per Gyn who  Rx Evista 09-2014  h/o cervical dysplasia Derm: vulvar pruritus, temovate prn ok per gyn  PLAN: Bronchitis, bronchospasm Sx c/w bronchitis and bronchospasm by history, her expiratory time is slightly increased. No history of asthma but she had bronchospasm before. Plan: Zithromax, prednisone, albuterol as needed. See instructions.

## 2017-04-25 NOTE — Patient Instructions (Addendum)
Rest, fluids , tylenol  For cough:  Take Mucinex DM twice a day as needed until better  For wheezing: albuterol as needed   Avoid decongestants such as  Pseudoephedrine or phenylephrine     Take the antibiotic as prescribed  (zithromax) and prednisone for few days   Call if not gradually better over the next  10 days  Call anytime if the symptoms are severe

## 2017-04-26 NOTE — Assessment & Plan Note (Signed)
Bronchitis, bronchospasm Sx c/w bronchitis and bronchospasm by history, her expiratory time is slightly increased. No history of asthma but she had bronchospasm before. Plan: Zithromax, prednisone, albuterol as needed. See instructions.

## 2017-04-29 ENCOUNTER — Other Ambulatory Visit: Payer: Self-pay | Admitting: Internal Medicine

## 2017-05-25 DIAGNOSIS — E669 Obesity, unspecified: Secondary | ICD-10-CM | POA: Diagnosis not present

## 2017-05-25 DIAGNOSIS — M797 Fibromyalgia: Secondary | ICD-10-CM | POA: Diagnosis not present

## 2017-05-25 DIAGNOSIS — E069 Thyroiditis, unspecified: Secondary | ICD-10-CM | POA: Diagnosis not present

## 2017-05-25 DIAGNOSIS — K2970 Gastritis, unspecified, without bleeding: Secondary | ICD-10-CM | POA: Diagnosis not present

## 2017-06-29 ENCOUNTER — Telehealth: Payer: Self-pay | Admitting: Cardiovascular Disease

## 2017-06-29 ENCOUNTER — Ambulatory Visit (INDEPENDENT_AMBULATORY_CARE_PROVIDER_SITE_OTHER): Payer: Federal, State, Local not specified - PPO | Admitting: Internal Medicine

## 2017-06-29 ENCOUNTER — Encounter: Payer: Self-pay | Admitting: Internal Medicine

## 2017-06-29 VITALS — BP 124/78 | HR 86 | Temp 98.1°F | Resp 14 | Ht 63.0 in | Wt 208.2 lb

## 2017-06-29 DIAGNOSIS — E039 Hypothyroidism, unspecified: Secondary | ICD-10-CM

## 2017-06-29 DIAGNOSIS — Z23 Encounter for immunization: Secondary | ICD-10-CM

## 2017-06-29 DIAGNOSIS — I10 Essential (primary) hypertension: Secondary | ICD-10-CM | POA: Diagnosis not present

## 2017-06-29 DIAGNOSIS — I4891 Unspecified atrial fibrillation: Secondary | ICD-10-CM

## 2017-06-29 DIAGNOSIS — E118 Type 2 diabetes mellitus with unspecified complications: Secondary | ICD-10-CM

## 2017-06-29 LAB — CBC WITH DIFFERENTIAL/PLATELET
BASOS ABS: 0.1 10*3/uL (ref 0.0–0.1)
BASOS PCT: 1.3 % (ref 0.0–3.0)
EOS PCT: 1.8 % (ref 0.0–5.0)
Eosinophils Absolute: 0.2 10*3/uL (ref 0.0–0.7)
HEMATOCRIT: 43 % (ref 36.0–46.0)
Hemoglobin: 14 g/dL (ref 12.0–15.0)
LYMPHS PCT: 29.4 % (ref 12.0–46.0)
Lymphs Abs: 2.5 10*3/uL (ref 0.7–4.0)
MCHC: 32.6 g/dL (ref 30.0–36.0)
MCV: 92.4 fl (ref 78.0–100.0)
Monocytes Absolute: 0.6 10*3/uL (ref 0.1–1.0)
Monocytes Relative: 6.6 % (ref 3.0–12.0)
NEUTROS ABS: 5.2 10*3/uL (ref 1.4–7.7)
Neutrophils Relative %: 60.9 % (ref 43.0–77.0)
PLATELETS: 311 10*3/uL (ref 150.0–400.0)
RBC: 4.65 Mil/uL (ref 3.87–5.11)
RDW: 13.8 % (ref 11.5–15.5)
WBC: 8.6 10*3/uL (ref 4.0–10.5)

## 2017-06-29 LAB — BASIC METABOLIC PANEL
BUN: 20 mg/dL (ref 6–23)
CHLORIDE: 104 meq/L (ref 96–112)
CO2: 27 meq/L (ref 19–32)
Calcium: 9.9 mg/dL (ref 8.4–10.5)
Creatinine, Ser: 0.95 mg/dL (ref 0.40–1.20)
GFR: 63.01 mL/min (ref >60.00)
GLUCOSE: 112 mg/dL — AB (ref 70–99)
POTASSIUM: 3.8 meq/L (ref 3.5–5.1)
SODIUM: 142 meq/L (ref 135–145)

## 2017-06-29 LAB — TSH: TSH: 0.69 u[IU]/mL (ref 0.35–4.50)

## 2017-06-29 LAB — PROTIME-INR
INR: 1.1 ratio — ABNORMAL HIGH (ref 0.8–1.0)
PROTHROMBIN TIME: 12 s (ref 9.6–13.1)

## 2017-06-29 LAB — HEMOGLOBIN A1C: Hgb A1c MFr Bld: 5.7 % (ref 4.6–6.5)

## 2017-06-29 LAB — APTT: aPTT: 26.2 s (ref 23.4–32.7)

## 2017-06-29 MED ORDER — RIVAROXABAN 20 MG PO TABS: 20.0000 mg | ORAL_TABLET | Freq: Every day | ORAL | 1 refills | Status: DC

## 2017-06-29 MED ORDER — METOPROLOL TARTRATE 50 MG PO TABS: 50.0000 mg | ORAL_TABLET | Freq: Two times a day (BID) | ORAL | 5 refills | Status: DC

## 2017-06-29 NOTE — Progress Notes (Signed)
Pre visit review using our clinic review tool, if applicable. No additional management support is needed unless otherwise documented below in the visit note. 

## 2017-06-29 NOTE — Patient Instructions (Addendum)
GO TO THE LAB : Get the blood work    GO TO THE FRONT DESK Schedule your next appointment for a checkup in 3 months  Stop lisinopril    Start metoprolol 50 mg 1 tablet twice a day  Start Xarelto 20 mg 1 tablet daily  Hold aspirin until you see the heart doctor  Check the  blood pressure   daily Be sure your blood pressure is between 110/65 and  135/85. If it is consistently higher or lower, let me know   Please see Dr. Oval Linsey 2:40 PM tomorrow Location [facility], [place], [place] [address] Please arrive 30 minutes earlier.   Fibrilacin auricular (Atrial Fibrillation) La fibrilacin auricular es un tipo de latido cardaco irregular o rpido (arritmia). En la fibrilacin auricular, el corazn tiembla continuamente en un patrn catico. Esto ocurre cuando regiones del corazn reciben seales desorganizadas que le impiden a este rgano bombear con normalidad. Esto puede incrementar el riesgo de ictus, insuficiencia cardaca y otras enfermedades relacionadas con el corazn. Hay diferentes tipos de fibrilacin auricular, entre ellos:  Fibrilacin auricular paroxstica. Este tipo de fibrilacin se presenta de Geographical information systems officer repentina y suele detenerse por s sola poco tiempo despus de haber comenzado.  Fibrilacin auricular persistente. Este tipo suele durar ms de [employer]. Puede detenerse por s sola o con tratamiento.  Fibrilacin auricular persistente de larga duracin. Este tipo dura ms de 61meses.  Fibrilacin auricular permanente. Este tipo no desaparece. Hable con el mdico para saber qu tipo de fibrilacin auricular es la que usted tiene. CAUSAS Las causas de esta afeccin son algunas enfermedades o procedimientos que guardan relacin con el corazn, e incluyen lo siguiente:  Infarto de miocardio.  Enfermedad arterial coronaria.  Insuficiencia cardaca.  [place].  Hipertensin arterial.  Inflamacin de la membrana que  rodea el corazn (pericarditis).  Ciruga cardaca.  Algunos tipos de trastornos del ritmo cardaco, como sndrome de Wolff-Parkinson-White. Algunas otras causas son las siguientes:  Neumona.  Tiene apnea obstructiva del sueo.  Obstruccin de una arteria en los pulmones (embolia pulmonar o EP).  Cncer de pulmn.  Enfermedad pulmonar crnica.  Problemas tiroideos, especialmente si la tiroides es hiperactiva (hipertiroidismo).  Cafena.  Consumo excesivo de alcohol o de drogas.  Uso de algunos medicamentos, incluidos ciertos descongestivos y pastillas para Horticulturist, commercial. A veces, la causa no se puede determinar. FACTORES DE RIESGO Es ms probable que esta afeccin se manifieste en:  [employer].  Los fumadores.  Las personas que tienen diabetes mellitus.  Las personas que tienen sobrepeso (obesidad).  Los atletas que [employer]. SNTOMAS Los sntomas de esta afeccin incluyen lo siguiente:  Sensacin de que el corazn late rpida o irregularmente.  Sensacin de Ecologist.  Falta de aire.  Sensacin repentina de desvanecimiento o debilidad.  Cansarse con facilidad durante la actividad fsica. En algunos casos no hay sntomas. DIAGNSTICO El mdico puede detectar la fibrilacin auricular al tomarle el pulso. Si se la detecta, esta afeccin se puede diagnosticar a travs de lo siguiente:  Un electrocardiograma (ECG).  Un estudio con monitor de Holter que [employer] patrones de los latidos cardacos durante un lapso de 24horas.  Un ecocardiograma transtorcico (ETT) para evaluar la circulacin de sangre en el corazn.  Un ecocardiograma transesofgico (ETE) para ver imgenes ms detalladas del corazn.  Una prueba de esfuerzo.  Estudios de diagnstico por imgenes, como una tomografa computarizada (TC) o una radiografa de trax.  Anlisis de  sangre. TRATAMIENTO Los principales objetivos del tratamiento son  evitar la formacin de cogulos sanguneos y Theatre manager el corazn latiendo a una frecuencia y un ritmo normales. El tipo de tratamiento que se administra depende de muchos factores, por ejemplo, las enfermedades preexistentes y cmo se siente cuando tiene fibrilacin auricular. El tratamiento de esta afeccin puede incluir lo siguiente:  Medicamentos para Health and safety inspector frecuencia cardaca, normalizar el ritmo cardaco o evitar la formacin de cogulos sanguneos.  Cardioversin elctrica. Se trata de un procedimiento que restablece el ritmo cardaco al aplicar una descarga elctrica controlada de baja intensidad en el corazn a travs de la piel.  Diferentes tipos de ablacin, como la ablacin por catter, la ablacin por catter con marcapasos o la ablacin [place]. Estos procedimientos destruyen los tejidos del corazn que envan las seales anormales. Cuando se [place] un marcapasos, se lo coloca debajo de la piel para ayudar a que el corazn lata a un ritmo constante. [place] los medicamentos de venta libre y los recetados solamente como se lo haya indicado el mdico.  Si el mdico le recet un anticoagulante, tmelo exactamente como se lo indic. Tomar dosis excesivas de anticoagulantes puede causar hemorragias. Si no toma las dosis suficientes de anticoagulantes, no tendr la proteccin necesaria contra el ictus y Educational psychologist.  No consuma productos que contengan tabaco, incluidos cigarrillos, tabaco de Higher education careers adviser y Psychologist, sport and exercise. Si necesita ayuda para dejar de fumar, consulte al mdico.  Si tiene apnea obstructiva del sueo, trate la afeccin como se lo haya indicado el mdico.  No beba alcohol.  No tome bebidas que contengan cafena, como caf, refrescos y t.  Mantenga un peso saludable. No tome pastillas para adelgazar a menos que el mdico lo autorice. Estas pueden agravar los problemas cardacos.  Siga las indicaciones del mdico  con respecto a Engineer, technical sales.  Haga actividad fsica habitualmente como se lo haya indicado el mdico.  Concurra a todas las visitas de control como se lo haya indicado el mdico. Esto es importante.  PREVENCIN  No tome bebidas que contengan cafena o alcohol.  Evite tomar determinados medicamentos, especialmente los que se usan para los problemas respiratorios.  Evite tomar determinadas hierbas y [employer], por ejemplo, los que contienen efedra o ginseng.  No consuma drogas, como cocana y anfetaminas.  No fume.  Mantenga la hipertensin arterial bajo control.  SOLICITE ATENCIN MDICA SI:  Nota un cambio en la frecuencia, el ritmo o la fuerza de los latidos cardacos.  Est tomando un anticoagulante y Cathleen Fears mayor cantidad de hematomas.  Se cansa ms fcilmente cuando hace actividad fsica o esfuerzos.  SOLICITE ATENCIN MDICA DE INMEDIATO SI:  Tiene dolor de pecho, dolor abdominal, sudoracin o debilidad.  Siente nuseas.  Observa sangre en el vmito, en las heces o en la orina.  Le falta el aire.  Se le hinchan los pies y los tobillos repentinamente.  Siente mareos.  Siente debilidad o adormecimiento sbito de la cara, el brazo o la pierna, especialmente en un lado del cuerpo.  Tiene dificultad para hablar, para comprender o para ambas cosas (afasia).  El rostro o el prpado se le caen hacia un lado. Estos sntomas pueden representar un problema grave que constituye Engineer, maintenance (IT). No espere hasta que los sntomas desaparezcan. Solicite atencin mdica de inmediato. Comunquese con el servicio de emergencias de su localidad (911 en los [place]). No conduzca por sus propios medios [employer] hospital. Esta informacin no tiene  como fin reemplazar el consejo del mdico. Asegrese de hacerle al mdico cualquier pregunta que tenga. Document Released: 08/01/2005 Document Revised: 04/22/2015 Document Reviewed: 11/26/2014 Elsevier Interactive Patient  Education  2017 [employer].

## 2017-06-29 NOTE — Progress Notes (Signed)
Subjective:    Patient ID: Melinda Mckinney, female    DOB: 06-May-1956, 61 y.o.   MRN: [id number]  DOS:  06/29/2017 Type of visit - description : f/u Interval history: Since the last office visit: She continued to do well with diet, has decreased carbohydrate intake, continue to lose weight.    HTN: Good med compliance, ambulatory BPs within normal. Insomnia: Well controlled with Ambien  Wt Readings from Last 3 Encounters:  06/29/17 208 lb 4 oz (94.5 kg)  04/25/17 222 lb 2 oz (100.8 kg)  01/06/17 229 lb 6 oz (104 kg)     Review of Systems I noted possibly irregular heartbeat, she denies chest pain, palpitations, lower extremity edema.  Did report some difficulty breathing when she goes upstairs but that is a chronic issue and not getting worse.   Past Medical History:  Diagnosis Date  . Abnormal Pap smear of cervix   . Anxiety and depression   . CAD (coronary artery disease) ~2009   stent   . Cervical dysplasia   . DDD (degenerative disc disease), lumbar    Dr. Nelva Bush, recommends lumbar epidural steroid injections L5-S1 to the right  . Fibromyalgia   . GERD (gastroesophageal reflux disease)   . High cholesterol   . Hypertension   . Insomnia 09/27/2013  . Lichen sclerosus    Vulvar area  . Osteoporosis   . S/p nephrectomy   . Thyroid disease   . Vitamin D deficiency     Past Surgical History:  Procedure Laterality Date  . CESAREAN SECTION     x 3   . esophagomyomectomy (heller)  2009  . GYNECOLOGIC CRYOSURGERY     X2  . HERNIA REPAIR     x 6 surgeries   . KNEE ARTHROSCOPY Right   . NEPHRECTOMY Right 1991   in [place], due to fibrosis and lithiasis    Social History   Socioeconomic History  . Marital status: Married    Spouse name: Not on file  . Number of children: 3  . Years of education: Not on file  . Highest education level: Not on file  Social Needs  . Financial resource strain: Not on file  . Food insecurity - worry: Not on file  . Food  insecurity - inability: Not on file  . Transportation needs - medical: Not on file  . Transportation needs - non-medical: Not on file  Occupational History  . 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 Smoking status: Former Research scientist (life sciences)  . Smokeless tobacco: Never Used  . Tobacco comment: quit ~ 2000 (2 ppd)  Substance and Sexual Activity  . Alcohol use: Yes    Alcohol/week: 0.0 oz    Comment: socially   . Drug use: No  . Sexual activity: No    Comment: 1ST INTERCOURSE- 12, PARTNERS - 2  Other Topics Concern  . Not on file  Social History Narrative   Original from [place]   3 children, 2 alive   Household --pt and husband       Allergies as of 06/29/2017      Reactions   Penicillins Other (See Comments)   Unknown reaction      Medication List        Accurate as of 06/29/17  9:28 AM. Always use your most recent med list.          albuterol 108 (90 Base) MCG/ACT inhaler Commonly known as:  VENTOLIN HFA Inhale 2 puffs into the  lungs every 6 (six) hours as needed for wheezing or shortness of breath.   allopurinol 100 MG tablet Commonly known as:  ZYLOPRIM Take 1 tablet (100 mg total) by mouth daily.   aspirin 81 MG tablet Take 81 mg by mouth daily.   atorvastatin 40 MG tablet Commonly known as:  LIPITOR Take 1 tablet (40 mg total) by mouth at bedtime.   azithromycin 250 MG tablet Commonly known as:  ZITHROMAX Z-PAK 2 tabs a day the first day, then 1 tab a day x 4 days   clobetasol cream 0.05 % Commonly known as:  TEMOVATE Apply 1 application topically 2 (two) times daily. Apply twice a week first week then once weekly as needed   cyclobenzaprine 10 MG tablet Commonly known as:  FLEXERIL Take 1 tablet (10 mg total) by mouth 2 (two) times daily as needed for muscle spasms.   DULoxetine 60 MG capsule Commonly known as:  CYMBALTA Take 1 capsule (60 mg total) by mouth daily.   hydrochlorothiazide 50 MG tablet Commonly known as:  HYDRODIURIL Take 1 tablet (50  mg total) by mouth daily.   levothyroxine 125 MCG tablet Commonly known as:  SYNTHROID, LEVOTHROID Take 1 tablet (125 mcg total) by mouth daily before breakfast.   lisinopril 10 MG tablet Commonly known as:  PRINIVIL,ZESTRIL Take 1 tablet (10 mg total) by mouth daily.   metFORMIN 500 MG tablet Commonly known as:  GLUCOPHAGE Take 1 tablet (500 mg total) by mouth 2 (two) times daily with a meal.   omeprazole 20 MG capsule Commonly known as:  PRILOSEC Take 1 capsule (20 mg total) by mouth daily.   predniSONE 10 MG tablet Commonly known as:  DELTASONE 4 tablets x 2 days, 3 tabs x 2 days, 2 tabs x 2 days, 1 tab x 2 days   raloxifene 60 MG tablet Commonly known as:  EVISTA Take 1 tablet (60 mg total) by mouth daily.   Vitamin D3 5000 units Caps Take 1 capsule by mouth.   zolpidem 5 MG tablet Commonly known as:  AMBIEN Take 1 tablet (5 mg total) by mouth at bedtime as needed for sleep.          Objective:   Physical Exam BP 124/78 (BP Location: Left Arm, Patient Position: Sitting, Cuff Size: Normal)   Pulse 86   Temp 98.1 F (36.7 C) (Oral)   Resp 14   Ht 5\' 3"  (1.6 m)   Wt 208 lb 4 oz (94.5 kg)   SpO2 95%   BMI 36.89 kg/m  General:   Well developed, well nourished . NAD.  HEENT:  Normocephalic . Face symmetric, atraumatic.  No thyromegaly Lungs:  CTA B Normal respiratory effort, no intercostal retractions, no accessory muscle use. Heart: Regular?,  No murmur.  No pretibial edema bilaterally  Skin: Not pale. Not jaundice Neurologic:  alert & oriented X3.  Speech normal, gait appropriate for age and unassisted Psych--  Cognition and judgment appear intact.  Cooperative with normal attention span and concentration.  Behavior appropriate. No anxious or depressed appearing.      Assessment & Plan:   Assessment   Hyperglycemia HTN High cholesterol Hypothyroidism Anxiety depression, insomnia Morbid obesity: BMI 42 (2011) Vitamin D deficiency, saw Dr.  Cruzita Lederer 07-2016, rtc 1 year, celiac dz test (-), unlikely  Malabsortion; cont supplements  GERD H/o achalasia, s/p Heller 2009 CAD , stent ~ 2009 SINGLE KIDNEY---S/p R  Nephrectomy ([place] 1991 due to stones/fibrosis) MSK: --Fibromyalgia --DJD  Knee pain  R --Back pain -- s/p epidural 10-16 Osteoporosis -- per Gyn who  Rx Evista 09-2014  h/o cervical dysplasia Derm: vulvar pruritus, temovate prn ok per gyn  PLAN: Irregular pulse: Noted irregular pulse, she is asx, EKG showed atrial fibrillation, rate 157 (pulse in the 90s when I checked).  EKG discussed with cardiology, they agree with A. fib, plan: Start Xarelto, stop lisinopril, start metoprolol, see cardiology tomorrow Will hold aspirin for now.  Check a CBC, PT PTT Extensive discussion with the patient regards the new diagnosis of A. Fib, pro-cons-risk of anticoagulations. Morbid obesity: Doing great with diet and exercise, BMI is now 36.    HTN: Continue  HCTZ, amb BPs normal.  Check a BMP Hypothyroidism: Continue Synthroid, check a TSH Flu shot today  Today, I spent more than 45   min with the patient: >50% of the time counseling regards new onset of atrial fibrillation, what this condition is, how to treat it, risk of anticoagulation, coordinating her care.

## 2017-06-29 NOTE — Assessment & Plan Note (Signed)
Irregular pulse: Noted irregular pulse, she is asx, EKG showed atrial fibrillation, rate 157 (pulse in the 90s when I checked).  EKG discussed with cardiology, they agree with A. fib, plan: Start Xarelto, stop lisinopril, start metoprolol, see cardiology tomorrow Will hold aspirin for now.  Check a CBC, PT PTT Extensive discussion with the patient regards the new diagnosis of A. Fib, pro-cons-risk of anticoagulations. Morbid obesity: Doing great with diet and exercise, BMI is now 36.    HTN: Continue  HCTZ, amb BPs normal.  Check a BMP Hypothyroidism: Continue Synthroid, check a TSH Flu shot today

## 2017-06-29 NOTE — Telephone Encounter (Signed)
Dr Oval Linsey spoke with Dr Larose Kells and patient scheduled for office visit tomorrow

## 2017-06-30 ENCOUNTER — Encounter: Payer: Self-pay | Admitting: Cardiovascular Disease

## 2017-06-30 ENCOUNTER — Ambulatory Visit (INDEPENDENT_AMBULATORY_CARE_PROVIDER_SITE_OTHER): Payer: Federal, State, Local not specified - PPO | Admitting: Cardiovascular Disease

## 2017-06-30 ENCOUNTER — Other Ambulatory Visit: Payer: Self-pay | Admitting: Internal Medicine

## 2017-06-30 VITALS — BP 120/76 | HR 153 | Ht 63.0 in | Wt 206.0 lb

## 2017-06-30 DIAGNOSIS — I4891 Unspecified atrial fibrillation: Secondary | ICD-10-CM

## 2017-06-30 DIAGNOSIS — E7800 Pure hypercholesterolemia, unspecified: Secondary | ICD-10-CM

## 2017-06-30 DIAGNOSIS — I10 Essential (primary) hypertension: Secondary | ICD-10-CM

## 2017-06-30 MED ORDER — METOPROLOL TARTRATE 100 MG PO TABS: 100.0000 mg | ORAL_TABLET | Freq: Two times a day (BID) | ORAL | 5 refills | Status: DC

## 2017-06-30 NOTE — Progress Notes (Signed)
Cardiology Office Note   Date:  07/06/2017   ID:  Melinda Mckinney, DOB 23-Oct-1955, MRN [id number]  PCP:  Colon Branch, MD  Cardiologist:   Skeet Latch, MD   Chief Complaint  Patient presents with  . New Patient (Initial Visit)    Melinda Mckinney  . Atrial Fibrillation     History of Present Illness: Melinda Mckinney is a 61 y.o. Spanish-speaking female with CAD s/p PCI, DM, hypertension, hyperlipidemia, and fibromyalgia who is being seen today for the evaluation of new onset atrial fibrillation at the request of Colon Branch, MD.   Melinda Mckinney reports that she has been feeling generally well until one week ago when she started feeling dizzy.  She has palpitations when lying on her left side but is otherwise denies palpitations.  She has shortness of breath with exertion that is chronic and she attributes to being overweight.  Melinda Mckinney notes that she has been taking weight loss pills that contain phentermine for 3 months.  She saw Dr. Larose Kells due to not feeling well and was found to be in atrial fibrillation with a ventricular rate of 157.  She was started on metoprolol and Xarelto.  Her lisinopril was held.  She is currently asymptomatic.  Melinda Mckinney denies lower extremity edema, orthopnea, or PND.  She snores and is tired in the mornings.  She reportedly had a sleep study 7-8 years ago that was negative for sleep apnea.  She drinks at least 2 cups of coffee daily.   Melinda Mckinney had Bell's palsy 14 years ago.  She has a coronary stent but denies MI.  She wanted to undergo plastic surgery in 2010.  She was referred for a stress test that was abnormal and subsequently underwent LHC and PCI.  Records are not available at this time.     Past Medical History:  Diagnosis Date  . Abnormal Pap smear of cervix   . Anxiety and depression   . CAD (coronary artery disease) ~2009   stent   . Cervical dysplasia   . DDD (degenerative disc disease), lumbar    Dr. Nelva Bush, recommends lumbar epidural steroid injections  L5-S1 to the right  . Fibromyalgia   . GERD (gastroesophageal reflux disease)   . High cholesterol   . Hypertension   . Insomnia 09/27/2013  . Lichen sclerosus    Vulvar area  . Osteoporosis   . S/p nephrectomy   . Thyroid disease   . Vitamin D deficiency     Past Surgical History:  Procedure Laterality Date  . CESAREAN SECTION     x 3   . esophagomyomectomy (heller)  2009  . GYNECOLOGIC CRYOSURGERY     X2  . HERNIA REPAIR     x 6 surgeries   . KNEE ARTHROSCOPY Right   . NEPHRECTOMY Right 1991   in [place], due to fibrosis and lithiasis     Current Outpatient Medications  Medication Sig Dispense Refill  . albuterol (VENTOLIN HFA) 108 (90 Base) MCG/ACT inhaler Inhale 2 puffs into the lungs every 6 (six) hours as needed for wheezing or shortness of breath. 1 Inhaler 1  . allopurinol (ZYLOPRIM) 100 MG tablet Take 1 tablet (100 mg total) by mouth daily. 90 tablet 2  . atorvastatin (LIPITOR) 40 MG tablet Take 1 tablet (40 mg total) by mouth at bedtime. 90 tablet 2  . Cholecalciferol (VITAMIN D3) 5000 UNITS CAPS Take 1 capsule by mouth.    . clobetasol cream (TEMOVATE)  4.00 % Apply 1 application topically 2 (two) times daily. Apply twice a week first week then once weekly as needed 30 g 1  . cyclobenzaprine (FLEXERIL) 10 MG tablet Take 1 tablet (10 mg total) by mouth 2 (two) times daily as needed for muscle spasms. 40 tablet 2  . hydrochlorothiazide (HYDRODIURIL) 50 MG tablet Take 1 tablet (50 mg total) by mouth daily. 90 tablet 2  . levothyroxine (SYNTHROID, LEVOTHROID) 125 MCG tablet Take 1 tablet (125 mcg total) by mouth daily before breakfast. 90 tablet 0  . metFORMIN (GLUCOPHAGE) 500 MG tablet Take 1 tablet (500 mg total) by mouth 2 (two) times daily with a meal. 180 tablet 2  . metoprolol tartrate (LOPRESSOR) 100 MG tablet Take 1 tablet (100 mg total) 2 (two) times daily by mouth. 60 tablet 5  . omeprazole (PRILOSEC) 20 MG capsule Take 1 capsule (20 mg total) by mouth  daily. 90 capsule 2  . raloxifene (EVISTA) 60 MG tablet Take 1 tablet (60 mg total) by mouth daily. 30 tablet 11  . rivaroxaban (XARELTO) 20 MG TABS tablet Take 1 tablet (20 mg total) daily with supper by mouth. 30 tablet 1  . zolpidem (AMBIEN) 5 MG tablet Take 1 tablet (5 mg total) by mouth at bedtime as needed for sleep. 90 tablet 2  . DULoxetine (CYMBALTA) 60 MG capsule Take 1 capsule (60 mg total) daily by mouth. 90 capsule 1   No current facility-administered medications for this visit.     Allergies:   Penicillins    Social History:  The patient  reports that she has quit smoking. she has never used smokeless tobacco. She reports that she drinks alcohol. She reports that she does not use drugs.   Family History:  The patient's family history includes Breast cancer in her other; CAD in her brother and father; Diabetes in her father; Lung cancer in her father and mother; Stroke in her sister.    ROS:  Please see the history of present illness.   Otherwise, review of systems are positive for none.   All other systems are reviewed and negative.    PHYSICAL EXAM: VS:  BP 120/76 (BP Location: Right Arm, Patient Position: Sitting, Cuff Size: Large)   Pulse (!) 153   Ht 5\' 3"  (1.6 m)   Wt 93.4 kg (206 lb)   BMI 36.49 kg/m  , BMI Body mass index is 36.49 kg/m. GENERAL:  Well appearing.  No acute distress HEENT:  Pupils equal round and reactive, fundi not visualized, oral mucosa unremarkable NECK:  No jugular venous distention, waveform within normal limits, carotid upstroke brisk and symmetric, no bruits, no thyromegaly LYMPHATICS:  No cervical adenopathy LUNGS:  Clear to auscultation bilaterally HEART:  RRR.  PMI not displaced or sustained,S1 and S2 within normal limits, no S3, no S4, no clicks, no rubs, no murmurs ABD:  Flat, positive bowel sounds normal in frequency in pitch, no bruits, no rebound, no guarding, no midline pulsatile mass, no hepatomegaly, no splenomegaly EXT:  2  plus pulses throughout, no edema, no cyanosis no clubbing SKIN:  No rashes no nodules NEURO:  Cranial nerves II through XII grossly intact, motor grossly intact throughout PSYCH:  Cognitively intact, oriented to person place and time   EKG:  EKG is ordered today. The ekg ordered today demonstrates atrial fibrillation.  Rate 153 bpm.    Recent Labs: 06/29/2017: TSH 0.69 07/05/2017: ALT 25; BUN 14; Creatinine, Ser 0.73; Hemoglobin 12.7; Platelets 237; Potassium 3.4; Sodium 140  Lipid Panel    Component Value Date/Time   CHOL 176 01/06/2017 1035   TRIG 100.0 01/06/2017 1035   HDL 81.50 01/06/2017 1035   CHOLHDL 2 01/06/2017 1035   VLDL 20.0 01/06/2017 1035   LDLCALC 74 01/06/2017 1035   LDLDIRECT 76.0 02/06/2015 0916      Wt Readings from Last 3 Encounters:  06/30/17 93.4 kg (206 lb)  06/29/17 94.5 kg (208 lb 4 oz)  04/25/17 100.8 kg (222 lb 2 oz)      ASSESSMENT AND PLAN:  # Paroxysmal atrial fibrillation: Ms. Mizner is in atrial fibrillation and rates remain poorly-controlled.  We will increase metoprolol to 100mg  twice daily.  Continue Xarelto.  If her BP gets low we can reduce HCTZ to 25mg  daily.  Thyroid studies pending.  We will also get an echo but this should be done after her rate is controlled.  She is asymptomatic, so we will not admit her for rate control.  I have advised her not to take any diet pills and to limit caffeine intake.  She will need another sleep study.  Last one was 7-8 years ago and symptoms have worsened.  # Hyperlipidemia: LDL 74 12/2016.  Continue atorvastatin.    # Hypertension: BP controlled.  Increase metoprolol and continue HCTZ.     Current medicines are reviewed at length with the patient today.  The patient does not have concerns regarding medicines.  The following changes have been made:  Increase metoprolol  Labs/ tests ordered today include:   Orders Placed This Encounter  Procedures  . EKG 12-Lead     Disposition:   FU with  Karina Lenderman C. Oval Linsey, MD, [facility] in 2 weeks.    This note was written with the assistance of speech recognition software.  Please excuse any transcriptional errors.  Signed, Amaal Dimartino C. Oval Linsey, MD, [facility]  07/06/2017 8:06 PM    [facility]

## 2017-06-30 NOTE — Patient Instructions (Addendum)
Medication Instructions:  INCREASE YOUR METOPROLOL TO 100 MG TWICE A DAY  MONITOR YOUR BLOOD PRESSURE AT HOME AND IF YOUR TOP NUMBER IS UNDER 100 DECREASE YOUR HYDROCHLOROTHIAZIDE TO 50 MG 1/2 TABLET DAILY   Labwork: NONE  Testing/Procedures: NONE  Follow-Up: Your physician recommends that you schedule a follow-up appointment in: IN 2 [place]   If you need a refill on your cardiac medications before your next appointment, please call your pharmacy.

## 2017-07-04 ENCOUNTER — Telehealth: Payer: Self-pay | Admitting: Nurse Practitioner

## 2017-07-04 NOTE — Telephone Encounter (Signed)
   Pts dtr in law called.  Pt placed on metoprolol and xarelto on 11/15.  Seen in our office 11/16.  Sounds like metoprolol was titrated to 100 bid.  Beginning yesterday, pt developed headache w/ n/v.  This has persisted.  BP apparently ok.  HR unknown.  Family is concerned that she is having a reaction to med. I rec that she is having a severe, unrelenting h/a with persistent n/v, she is best served by ER eval tonight.  If symptoms are mild, she may try to take some tylenol for headache and see if other symptoms improve as well.  It's not clear that symptoms are a direct result of either  blocker or xarelto.  Pts family will call back in AM if not seen in ED tonight for additional direction.  Caller verbalized understanding and was grateful for the call back.  Murray Hodgkins, NP 07/04/2017, 7:14 PM

## 2017-07-05 ENCOUNTER — Other Ambulatory Visit: Payer: Self-pay

## 2017-07-05 ENCOUNTER — Encounter (HOSPITAL_BASED_OUTPATIENT_CLINIC_OR_DEPARTMENT_OTHER): Payer: Self-pay

## 2017-07-05 ENCOUNTER — Emergency Department (HOSPITAL_BASED_OUTPATIENT_CLINIC_OR_DEPARTMENT_OTHER)
Admission: EM | Admit: 2017-07-05 | Discharge: 2017-07-05 | Disposition: A | Discharge status: Home / Self Care | Payer: Federal, State, Local not specified - PPO | Attending: Emergency Medicine | Admitting: Emergency Medicine

## 2017-07-05 ENCOUNTER — Emergency Department (HOSPITAL_BASED_OUTPATIENT_CLINIC_OR_DEPARTMENT_OTHER): Payer: Federal, State, Local not specified - PPO

## 2017-07-05 DIAGNOSIS — I10 Essential (primary) hypertension: Secondary | ICD-10-CM | POA: Insufficient documentation

## 2017-07-05 DIAGNOSIS — R51 Headache: Secondary | ICD-10-CM | POA: Insufficient documentation

## 2017-07-05 DIAGNOSIS — R002 Palpitations: Secondary | ICD-10-CM | POA: Diagnosis not present

## 2017-07-05 DIAGNOSIS — R0602 Shortness of breath: Secondary | ICD-10-CM | POA: Diagnosis not present

## 2017-07-05 DIAGNOSIS — Z7901 Long term (current) use of anticoagulants: Secondary | ICD-10-CM | POA: Insufficient documentation

## 2017-07-05 DIAGNOSIS — R1110 Vomiting, unspecified: Secondary | ICD-10-CM | POA: Diagnosis not present

## 2017-07-05 DIAGNOSIS — I2510 Atherosclerotic heart disease of native coronary artery without angina pectoris: Secondary | ICD-10-CM | POA: Insufficient documentation

## 2017-07-05 DIAGNOSIS — Z79899 Other long term (current) drug therapy: Secondary | ICD-10-CM | POA: Insufficient documentation

## 2017-07-05 DIAGNOSIS — I4891 Unspecified atrial fibrillation: Secondary | ICD-10-CM | POA: Diagnosis not present

## 2017-07-05 DIAGNOSIS — R519 Headache, unspecified: Secondary | ICD-10-CM

## 2017-07-05 DIAGNOSIS — Z87891 Personal history of nicotine dependence: Secondary | ICD-10-CM | POA: Insufficient documentation

## 2017-07-05 LAB — CBC WITH DIFFERENTIAL/PLATELET
BASOS ABS: 0 10*3/uL (ref 0.0–0.1)
BASOS PCT: 0 %
EOS ABS: 0.1 10*3/uL (ref 0.0–0.7)
Eosinophils Relative: 2 %
HCT: 39.4 % (ref 36.0–46.0)
Hemoglobin: 12.7 g/dL (ref 12.0–15.0)
Lymphocytes Relative: 21 %
Lymphs Abs: 1.4 10*3/uL (ref 0.7–4.0)
MCH: 29.6 pg (ref 26.0–34.0)
MCHC: 32.2 g/dL (ref 30.0–36.0)
MCV: 91.8 fL (ref 78.0–100.0)
MONO ABS: 0.5 10*3/uL (ref 0.1–1.0)
MONOS PCT: 8 %
NEUTROS ABS: 4.8 10*3/uL (ref 1.7–7.7)
Neutrophils Relative %: 69 %
PLATELETS: 237 10*3/uL (ref 150–400)
RBC: 4.29 MIL/uL (ref 3.87–5.11)
RDW: 13.7 % (ref 11.5–15.5)
WBC: 6.9 10*3/uL (ref 4.0–10.5)

## 2017-07-05 LAB — COMPREHENSIVE METABOLIC PANEL
ALBUMIN: 3.5 g/dL (ref 3.5–5.0)
ALT: 25 U/L (ref 14–54)
ANION GAP: 9 (ref 5–15)
AST: 18 U/L (ref 15–41)
Alkaline Phosphatase: 95 U/L (ref 38–126)
BILIRUBIN TOTAL: 0.5 mg/dL (ref 0.3–1.2)
BUN: 14 mg/dL (ref 6–20)
CHLORIDE: 107 mmol/L (ref 101–111)
CO2: 24 mmol/L (ref 22–32)
Calcium: 9.1 mg/dL (ref 8.9–10.3)
Creatinine, Ser: 0.73 mg/dL (ref 0.44–1.00)
GFR calc Af Amer: >60 mL/min (ref >60)
GFR calc non Af Amer: >60 mL/min (ref >60)
GLUCOSE: 118 mg/dL — AB (ref 65–99)
Potassium: 3.4 mmol/L — ABNORMAL LOW (ref 3.5–5.1)
SODIUM: 140 mmol/L (ref 135–145)
TOTAL PROTEIN: 6.7 g/dL (ref 6.5–8.1)

## 2017-07-05 LAB — TROPONIN I: Troponin I: <0.03 ng/mL (ref <0.03)

## 2017-07-05 LAB — LIPASE, BLOOD: Lipase: 21 U/L (ref 11–51)

## 2017-07-05 MED ORDER — PROMETHAZINE HCL 25 MG/ML IJ SOLN
12.5000 mg | Freq: Once | INTRAMUSCULAR | Status: AC
  Administered 2017-07-05: 12.5 mg via INTRAVENOUS
  Filled 2017-07-05: qty 1

## 2017-07-05 MED ORDER — ETOMIDATE 2 MG/ML IV SOLN
10.0000 mg | Freq: Once | INTRAVENOUS | Status: AC
  Administered 2017-07-05: 10 mg via INTRAVENOUS
  Filled 2017-07-05: qty 10

## 2017-07-05 MED ORDER — SODIUM CHLORIDE 0.9 % IV BOLUS (SEPSIS)
1000.0000 mL | Freq: Once | INTRAVENOUS | Status: AC
  Administered 2017-07-05: 1000 mL via INTRAVENOUS

## 2017-07-05 MED ORDER — DILTIAZEM HCL 25 MG/5ML IV SOLN
20.0000 mg | Freq: Once | INTRAVENOUS | Status: AC
  Administered 2017-07-05: 20 mg via INTRAVENOUS
  Filled 2017-07-05: qty 5

## 2017-07-05 NOTE — ED Provider Notes (Signed)
[facility] DE[facility] Provider Note   CSN: [id number] Arrival date & time: 07/05/17  0806     History   Chief Complaint Chief Complaint  Patient presents with  . Headache  . Chest Pain    HPI Melinda Mckinney is a 61 y.o. female history of CAD with stent, fibromyalgia, new onset A. fib on Xarelto, here presenting with vomiting, headache, palpitations.  Patient had palpitations about 2 weeks ago and had a Holter monitor placed showed new onset atrial fibrillation.  Patient was seen in the office about a week ago and started on Xarelto.  About 6 days ago, patient was seen by cardiology and put on metoprolol as well.  Patient states that she was feeling well until 3 days ago when she had worsening palpitations and chest pressure.  Also has worsening headaches and started vomiting today.  Denies any abdominal pain or diarrhea.  Denies any fevers or chills or shortness of breath.  Denies recent travel or history of blood clots. Denies any leg swelling or calf pain.   The history is provided by the patient.    Past Medical History:  Diagnosis Date  . Abnormal Pap smear of cervix   . Anxiety and depression   . CAD (coronary artery disease) ~2009   stent   . Cervical dysplasia   . DDD (degenerative disc disease), lumbar    Dr. Nelva Bush, recommends lumbar epidural steroid injections L5-S1 to the right  . Fibromyalgia   . GERD (gastroesophageal reflux disease)   . High cholesterol   . Hypertension   . Insomnia 09/27/2013  . Lichen sclerosus    Vulvar area  . Osteoporosis   . S/p nephrectomy   . Thyroid disease   . Vitamin D deficiency     Patient Active Problem List   Diagnosis Date Noted  . Atrial fibrillation ([place]) 06/29/2017  . Diabetes mellitus, type II ([place]) 01/06/2017  . PCP NOTES >>>> 06/16/2015  . Back pain 02/05/2015  . Hypothyroidism 01/19/2015  . Vitamin D deficiency   . Vulvar atrophy 08/20/2014  . Annual physical exam 07/24/2014  . Anemia  07/24/2014  . Insomnia 09/27/2013  . Intertrigo 09/27/2013  . Urolithiasis 09/27/2013  . Hypertension   . GERD, h/o achalasia s/p heller ~2009)   . High cholesterol   . CAD (coronary artery disease)   . Osteoporosis   . Fibromyalgia     Past Surgical History:  Procedure Laterality Date  . CESAREAN SECTION     x 3   . esophagomyomectomy (heller)  2009  . GYNECOLOGIC CRYOSURGERY     X2  . HERNIA REPAIR     x 6 surgeries   . KNEE ARTHROSCOPY Right   . NEPHRECTOMY Right 1991   in [place], due to fibrosis and lithiasis    OB History    Gravida Para Term Preterm AB Living   4 2     2 2    SAB TAB Ectopic Multiple Live Births   2               Home Medications    Prior to Admission medications   Medication Sig Start Date End Date Taking? Authorizing Provider  albuterol (VENTOLIN HFA) 108 (90 Base) MCG/ACT inhaler Inhale 2 puffs into the lungs every 6 (six) hours as needed for wheezing or shortness of breath. 04/25/17   Colon Branch, MD  allopurinol (ZYLOPRIM) 100 MG tablet Take 1 tablet (100 mg total) by mouth daily. 06/29/16  Colon Branch, MD  atorvastatin (LIPITOR) 40 MG tablet Take 1 tablet (40 mg total) by mouth at bedtime. 06/29/16   Colon Branch, MD  Cholecalciferol (VITAMIN D3) 5000 UNITS CAPS Take 1 capsule by mouth.    [provider]  clobetasol cream (TEMOVATE) 4.01 % Apply 1 application topically 2 (two) times daily. Apply twice a week first week then once weekly as needed 01/06/17   Colon Branch, MD  cyclobenzaprine (FLEXERIL) 10 MG tablet Take 1 tablet (10 mg total) by mouth 2 (two) times daily as needed for muscle spasms. 02/24/16   Colon Branch, MD  DULoxetine (CYMBALTA) 60 MG capsule Take 1 capsule (60 mg total) daily by mouth. 07/03/17   Colon Branch, MD  hydrochlorothiazide (HYDRODIURIL) 50 MG tablet Take 1 tablet (50 mg total) by mouth daily. 06/29/16   Colon Branch, MD  levothyroxine (SYNTHROID, LEVOTHROID) 125 MCG tablet Take 1 tablet (125 mcg total) by  mouth daily before breakfast. 05/01/17   Colon Branch, MD  metFORMIN (GLUCOPHAGE) 500 MG tablet Take 1 tablet (500 mg total) by mouth 2 (two) times daily with a meal. 06/29/16   Colon Branch, MD  metoprolol tartrate (LOPRESSOR) 100 MG tablet Take 1 tablet (100 mg total) 2 (two) times daily by mouth. 06/30/17   Skeet Latch, MD  omeprazole (PRILOSEC) 20 MG capsule Take 1 capsule (20 mg total) by mouth daily. 06/29/16   Colon Branch, MD  raloxifene (EVISTA) 60 MG tablet Take 1 tablet (60 mg total) by mouth daily. 09/08/16   Terrance Mass, MD  rivaroxaban (XARELTO) 20 MG TABS tablet Take 1 tablet (20 mg total) daily with supper by mouth. 06/29/17   Colon Branch, MD  zolpidem (AMBIEN) 5 MG tablet Take 1 tablet (5 mg total) by mouth at bedtime as needed for sleep. 09/08/16   Terrance Mass, MD    Family History Family History  Problem Relation Age of Onset  . Breast cancer Other        aunt   . CAD Brother   . Lung cancer Mother        F and M  . Diabetes Father        F and mother   . CAD Father   . Lung cancer Father   . Stroke Sister   . Colon cancer Neg Hx     Social History Social History   Tobacco Use  . Smoking status: Former Research scientist (life sciences)  . Smokeless tobacco: Never Used  . Tobacco comment: quit ~ 2000 (2 ppd)  Substance Use Topics  . Alcohol use: Yes    Alcohol/week: 0.0 oz    Comment: socially   . Drug use: No     Allergies   Penicillins   Review of Systems Review of Systems  Cardiovascular: Positive for chest pain.  Neurological: Positive for headaches.  All other systems reviewed and are negative.    Physical Exam Updated Vital Signs BP (!) 156/90   Pulse 71   Temp 98.6 F (37 C) (Oral)   Resp 16   SpO2 96%   Physical Exam  Constitutional:  Uncomfortable, tachycardic   HENT:  Head: Normocephalic.  Mouth/Throat: Oropharynx is clear and moist.  Eyes: EOM are normal. Pupils are equal, round, and reactive to light.  Neck: Normal range of motion.  Neck supple.  Cardiovascular:  Irregular, tachycardic   Pulmonary/Chest: Effort normal and breath sounds normal.  Abdominal: Soft. Bowel sounds are normal.  Musculoskeletal: Normal range of motion.  Neurological: She is alert. She has normal strength. She displays normal reflexes. GCS eye subscore is 4. GCS verbal subscore is 5. GCS motor subscore is 6.  Skin: Skin is warm.  Psychiatric: She has a normal mood and affect.  Nursing note and vitals reviewed.    ED Treatments / Results  Labs (all labs ordered are listed, but only abnormal results are displayed) Labs Reviewed  COMPREHENSIVE METABOLIC PANEL - Abnormal; Notable for the following components:      Result Value   Potassium 3.4 (*)    Glucose, Bld 118 (*)    All other components within normal limits  CBC WITH DIFFERENTIAL/PLATELET  TROPONIN I  LIPASE, BLOOD    EKG  EKG Interpretation  Date/Time:  Wednesday July 05 2017 08:15:07 EST Ventricular Rate:  152 PR Interval:    QRS Duration: 90 QT Interval:  259 QTC Calculation: 412 R Axis:   24 Text Interpretation:  Atrial fibrillation Ventricular premature complex Repolarization abnormality, prob rate related No previous ECGs available Confirmed by Wandra Arthurs ([id number]) on 07/05/2017 8:32:28 AM       Radiology Dg Chest Port 1 View  Result Date: 07/05/2017 CLINICAL DATA:  Shortness of breath. EXAM: PORTABLE CHEST 1 VIEW COMPARISON:  08/18/2011 FINDINGS: 0828 hours. The cardio pericardial silhouette is enlarged. Diffuse interstitial opacity suggests edema. Probable basilar atelectasis bilaterally. The visualized bony structures of the thorax are intact. Telemetry leads overlie the chest. IMPRESSION: Cardiomegaly with probable interstitial edema. Electronically Signed   By: Misty Stanley M.D.   On: 07/05/2017 09:22    Procedures .Cardioversion Date/Time: 07/05/2017 9:39 AM Performed by: Drenda Freeze, MD Authorized by: Drenda Freeze, MD   Consent:     Consent obtained:  Written   Consent given by:  Patient   Risks discussed:  Cutaneous burn, death and induced arrhythmia   Alternatives discussed:  No treatment Pre-procedure details:    Cardioversion basis:  Elective   Rhythm:  Atrial fibrillation Patient sedated: Yes. Refer to sedation procedure documentation for details of sedation.  Attempt one:    Cardioversion mode:  Synchronous   Waveform:  Biphasic   Shock (Joules):  150   Shock outcome:  Conversion to normal sinus rhythm Post-procedure details:    Patient status:  Awake   Patient tolerance of procedure:  Tolerated well, no immediate complications .Sedation Date/Time: 07/05/2017 9:40 AM Performed by: Drenda Freeze, MD Authorized by: Drenda Freeze, MD   Consent:    Consent obtained:  Verbal and written   Consent given by:  Patient   Risks discussed:  Prolonged hypoxia resulting in organ damage, prolonged sedation necessitating reversal and respiratory compromise necessitating ventilatory assistance and intubation Universal protocol:    Immediately prior to procedure a time out was called: yes   Indications:    Procedure performed:  Cardioversion   Intended level of sedation:  Moderate (conscious sedation) Pre-sedation assessment:    Time since last food or drink:  6 pm   ASA classification: class 2 - patient with mild systemic disease     Neck mobility: normal     Mouth opening:  3 or more finger widths   Mallampati score:  II - soft palate, uvula, fauces visible   Pre-sedation assessments completed and reviewed: airway patency, cardiovascular function, mental status and respiratory function   Immediate pre-procedure details:    Reviewed: vital signs   Procedure details (see MAR for exact dosages):    Preoxygenation:  Room air   Sedation:  Etomidate   Intra-procedure monitoring:  Blood pressure monitoring, cardiac monitor and continuous capnometry   Intra-procedure events: none     Total Provider sedation  time (minutes):  30 Post-procedure details:    Post-sedation assessment completed:  07/05/2017 10:22 AM   Attendance: Constant attendance by certified staff until patient recovered     Recovery: Patient returned to pre-procedure baseline     Post-sedation assessments completed and reviewed: airway patency     Patient is stable for discharge or admission: yes     Patient tolerance:  Tolerated well, no immediate complications   (including critical care time)  Medications Ordered in ED Medications  sodium chloride 0.9 % bolus 1,000 mL (0 mLs Intravenous Stopped 07/05/17 0912)  diltiazem (CARDIZEM) injection 20 mg (20 mg Intravenous Given 07/05/17 0826)  promethazine (PHENERGAN) injection 12.5 mg (12.5 mg Intravenous Given 07/05/17 0829)  etomidate (AMIDATE) injection 10 mg (10 mg Intravenous Given 07/05/17 0931)     Initial Impression / Assessment and Plan / ED Course  I have reviewed the triage vital signs and the nursing notes.  Pertinent labs & imaging results that were available during my care of the patient were reviewed by me and considered in my medical decision making (see chart for details).     Melinda Mckinney is a 61 y.o. female here with palpitations, chest pain. CHADVASC score of 4 and already on xarelto. She appears in rapid afib in the ED, rate 140s. Will get labs and CXR. Will give cardizem. Consider cardioversion if still remains in rapid afib.  9 AM Patient still symptomatic despite HR down to low 110s. Labs unremarkable. I discussed risks and benefits of cardioversion and she agrees with cardioversion. Her last meal was last night, has been compliant with xarelto.   10:21 AM Patient's HR remained 60-70, sinus rhythm. Headache and chest pressure resolved. She is awake, alert, felt better. Nl neuro exam right now. Told her to continue xarelto, metoprolol. She has follow up with cardiology next week and I encouraged her to follow up as scheduled. Gave strict return  precautions.     Final Clinical Impressions(s) / ED Diagnoses   Final diagnoses:  None    ED Discharge Orders    None       Drenda Freeze, MD 07/05/17 1022

## 2017-07-05 NOTE — Sedation Documentation (Signed)
Unable to assess pain due to sedation.  

## 2017-07-05 NOTE — ED Notes (Signed)
2 view chest xray changed to portable xray due to patient's dizziness

## 2017-07-05 NOTE — ED Notes (Signed)
Consent for sedation and cardioversion signed.

## 2017-07-05 NOTE — Discharge Instructions (Signed)
You need to continue taking xarelto and metoprolol as prescribed.   Take tylenol, motrin as needed for headaches.   See your cardiology next week as scheduled   Return to ER if you have worse shortness of breath, chest pain, dizziness, headaches, weakness, numbness, trouble speaking

## 2017-07-05 NOTE — ED Notes (Signed)
XR at bedside

## 2017-07-05 NOTE — Sedation Documentation (Signed)
Patient denies pain and is resting comfortably.  

## 2017-07-05 NOTE — ED Notes (Signed)
HR noted to drop to 90s after cardizem administration.

## 2017-07-05 NOTE — ED Triage Notes (Signed)
Family reports pt has had headache x 3 days. Reports vomiting started last night at 5pm. Pt also reports chest pressure as well. Pt was seen by PMD and cardiology last week. Pt in AFIB with RVR upon attachment to EKG machine in room.

## 2017-07-06 ENCOUNTER — Encounter: Payer: Self-pay | Admitting: Cardiovascular Disease

## 2017-07-14 ENCOUNTER — Encounter: Payer: Self-pay | Admitting: Cardiovascular Disease

## 2017-07-14 ENCOUNTER — Ambulatory Visit (INDEPENDENT_AMBULATORY_CARE_PROVIDER_SITE_OTHER): Payer: Federal, State, Local not specified - PPO | Admitting: Cardiovascular Disease

## 2017-07-14 VITALS — BP 115/78 | HR 111 | Ht 63.0 in | Wt 207.8 lb

## 2017-07-14 DIAGNOSIS — I10 Essential (primary) hypertension: Secondary | ICD-10-CM

## 2017-07-14 DIAGNOSIS — E7800 Pure hypercholesterolemia, unspecified: Secondary | ICD-10-CM

## 2017-07-14 DIAGNOSIS — Z955 Presence of coronary angioplasty implant and graft: Secondary | ICD-10-CM

## 2017-07-14 DIAGNOSIS — I4891 Unspecified atrial fibrillation: Secondary | ICD-10-CM | POA: Diagnosis not present

## 2017-07-14 MED ORDER — DILTIAZEM HCL ER 120 MG PO CP24
120.0000 mg | ORAL_CAPSULE | Freq: Every day | ORAL | 5 refills | Status: DC
Start: 2017-07-14 — End: 2017-07-29

## 2017-07-14 NOTE — Patient Instructions (Signed)
Medication Instructions:  DECREASE YOUR HYDROCHLOROTHIAZIDE TO 25 MG DAILY   START DILTIAZEM 120 MG DAILY   Labwork: NONE   Testing/Procedures: Your physician has requested that you have an echocardiogram. Echocardiography is a painless test that uses sound waves to create images of your heart. It provides your doctor with information about the size and shape of your heart and how well your heart's chambers and valves are working. This procedure takes approximately one hour. There are no restrictions for this procedure. [facility] [address]  Follow-Up: Your physician recommends that you schedule a follow-up appointment in: 2 MONTH OV  You have been referred to [facility]OLOGIST AT [address] IF YOU DO NOT [place] [facility] NEXT WEEK CALL [phone]   If you need a refill on your cardiac medications before your next appointment, please call your pharmacy.

## 2017-07-14 NOTE — Addendum Note (Signed)
Addended by: Alvina Filbert B on: 07/14/2017 02:20 PM   Modules accepted: Orders

## 2017-07-14 NOTE — Progress Notes (Signed)
Cardiology Office Note   Date:  07/14/2017   ID:  Melinda Mckinney, DOB [birth year], MRN [id number]  PCP:  Colon Branch, MD  Cardiologist:   Skeet Latch, MD   No chief complaint on file.    History of Present Illness: Melinda Mckinney is a 61 y.o. Spanish-speaking female with CAD s/p PCI, DM, hypertension, hyperlipidemia, and fibromyalgia here for follow-up.  She was initially seen 06/2017 for new onset atrial fibrillation.  This was noted by her PCP, Dr. Larose Kells.  Ms. Haecker noted that she had been taking weight loss pills that contain phentermine for 3 months.  She saw Dr. Larose Kells due to not feeling well and was found to be in atrial fibrillation with a ventricular rate of 157.  She was started on metoprolol and Xarelto.  Her lisinopril was held.  At her follow-up appointment she was persistently tachycardic so metoprolol was increased.  She was also referred for sleep study that has not yet been completed.  We discussed the importance of limiting her caffeine intake.  She has a coronary stent but denies MI.  She wanted to undergo plastic surgery in 2010.  She was referred for a stress test that was abnormal and subsequently underwent LHC and PCI.    Since her last appointment Ms. Gronau has been feeling better.  She had one day of feeling very poorly.  She had a headache, nausea, and vomiting frothy sputum.  She was seen in the emergency department 11/21 and reported worsened palpitations.  She underwent cardioversion in the emergency department despite the fact that [place] was started on 11/16.  She reports that she started immediately feeling better.  She continues to have occasional episodes of palpitations, though much improved from before.  She continues to have some shortness of breath that she notes especially when walking upstairs.  She is also been very sleepy and mildly dizzy.  She did have some issues with shortness of breath prior to developing atrial fibrillation but states that it has been worse  lately.  She denies any lower extremity edema, orthopnea, or PND.   Past Medical History:  Diagnosis Date  . Abnormal Pap smear of cervix   . Anxiety and depression   . CAD (coronary artery disease) ~2009   stent   . Cervical dysplasia   . DDD (degenerative disc disease), lumbar    Dr. Nelva Bush, recommends lumbar epidural steroid injections L5-S1 to the right  . Fibromyalgia   . GERD (gastroesophageal reflux disease)   . High cholesterol   . Hypertension   . Insomnia 09/27/2013  . Lichen sclerosus    Vulvar area  . Osteoporosis   . S/p nephrectomy   . Thyroid disease   . Vitamin D deficiency     Past Surgical History:  Procedure Laterality Date  . CESAREAN SECTION     x 3   . esophagomyomectomy (heller)  2009  . GYNECOLOGIC CRYOSURGERY     X2  . HERNIA REPAIR     x 6 surgeries   . KNEE ARTHROSCOPY Right   . NEPHRECTOMY Right 1991   in [place], due to fibrosis and lithiasis     Current Outpatient Medications  Medication Sig Dispense Refill  . albuterol (VENTOLIN HFA) 108 (90 Base) MCG/ACT inhaler Inhale 2 puffs into the lungs every 6 (six) hours as needed for wheezing or shortness of breath. 1 Inhaler 1  . allopurinol (ZYLOPRIM) 100 MG tablet Take 1 tablet (100 mg total) by mouth daily.  90 tablet 2  . atorvastatin (LIPITOR) 40 MG tablet Take 1 tablet (40 mg total) by mouth at bedtime. 90 tablet 2  . Cholecalciferol (VITAMIN D3) 5000 UNITS CAPS Take 1 capsule by mouth.    . clobetasol cream (TEMOVATE) 6.16 % Apply 1 application topically 2 (two) times daily. Apply twice a week first week then once weekly as needed 30 g 1  . cyclobenzaprine (FLEXERIL) 10 MG tablet Take 1 tablet (10 mg total) by mouth 2 (two) times daily as needed for muscle spasms. 40 tablet 2  . DULoxetine (CYMBALTA) 60 MG capsule Take 1 capsule (60 mg total) daily by mouth. 90 capsule 1  . hydrochlorothiazide (HYDRODIURIL) 50 MG tablet Take 50 mg by mouth as directed. 1/2 TABLET BY MOUTH DAILY    .  levothyroxine (SYNTHROID, LEVOTHROID) 125 MCG tablet Take 1 tablet (125 mcg total) by mouth daily before breakfast. 90 tablet 0  . metFORMIN (GLUCOPHAGE) 500 MG tablet Take 1 tablet (500 mg total) by mouth 2 (two) times daily with a meal. 180 tablet 2  . metoprolol tartrate (LOPRESSOR) 100 MG tablet Take 1 tablet (100 mg total) 2 (two) times daily by mouth. 60 tablet 5  . omeprazole (PRILOSEC) 20 MG capsule Take 1 capsule (20 mg total) by mouth daily. 90 capsule 2  . raloxifene (EVISTA) 60 MG tablet Take 1 tablet (60 mg total) by mouth daily. 30 tablet 11  . rivaroxaban (XARELTO) 20 MG TABS tablet Take 1 tablet (20 mg total) daily with supper by mouth. 30 tablet 1  . zolpidem (AMBIEN) 5 MG tablet Take 1 tablet (5 mg total) by mouth at bedtime as needed for sleep. 90 tablet 2  . diltiazem (DILACOR XR) 120 MG 24 hr capsule Take 1 capsule (120 mg total) by mouth daily. 30 capsule 5   No current facility-administered medications for this visit.     Allergies:   Penicillins    Social History:  The patient  reports that she has quit smoking. she has never used smokeless tobacco. She reports that she drinks alcohol. She reports that she does not use drugs.   Family History:  The patient's family history includes Breast cancer in her other; CAD in her brother and father; Diabetes in her father; Lung cancer in her father and mother; Stroke in her sister.    ROS:  Please see the history of present illness.   Otherwise, review of systems are positive for none.   All other systems are reviewed and negative.    PHYSICAL EXAM: VS:  BP 115/78   Pulse (!) 111   Ht 5\' 3"  (1.6 m)   Wt 207 lb 12.8 oz (94.3 kg)   BMI 36.81 kg/m  , BMI Body mass index is 36.81 kg/m. GENERAL:  Well appearing HEENT: Pupils equal round and reactive, fundi not visualized, oral mucosa unremarkable NECK:  No jugular venous distention, waveform within normal limits, carotid upstroke brisk and symmetric, no bruits, no  thyromegaly LYMPHATICS:  No cervical adenopathy LUNGS:  Clear to auscultation bilaterally HEART:  Tachycardic.  Irregularly irregular.  PMI not displaced or sustained,S1 and S2 within normal limits, no S3, no S4, no clicks, no rubs, no murmurs ABD:  Flat, positive bowel sounds normal in frequency in pitch, no bruits, no rebound, no guarding, no midline pulsatile mass, no hepatomegaly, no splenomegaly EXT:  2 plus pulses throughout, no edema, no cyanosis no clubbing SKIN:  No rashes no nodules NEURO:  Cranial nerves II through XII grossly intact,  motor grossly intact throughout PSYCH:  Cognitively intact, oriented to person place and time   EKG:  EKG is ordered today. The ekg ordered 07/14/17 demonstrates atrial fibrillation.  Rate 111 bpm.    Recent Labs: 06/29/2017: TSH 0.69 07/05/2017: ALT 25; BUN 14; Creatinine, Ser 0.73; Hemoglobin 12.7; Platelets 237; Potassium 3.4; Sodium 140    Lipid Panel    Component Value Date/Time   CHOL 176 01/06/2017 1035   TRIG 100.0 01/06/2017 1035   HDL 81.50 01/06/2017 1035   CHOLHDL 2 01/06/2017 1035   VLDL 20.0 01/06/2017 1035   LDLCALC 74 01/06/2017 1035   LDLDIRECT 76.0 02/06/2015 0916      Wt Readings from Last 3 Encounters:  07/14/17 207 lb 12.8 oz (94.3 kg)  06/30/17 206 lb (93.4 kg)  06/29/17 208 lb 4 oz (94.5 kg)      ASSESSMENT AND PLAN:  # Paroxysmal atrial fibrillation: Ms. Wigley is back in atrial fibrillation after cardioversion.  She continues to feel poorly.  I suspect that her fatigue is also due to metoprolol.  Her heart rates remain poorly-controlled.  We will reduce hydrochlorothiazide to 25 mg daily.  Continue metoprolol and add diltiazem 120 mg daily.  Continue Xarelto.  She will likely need an antiarrhythmic to maintain sinus rhythm.  We will refer her to elective physiology for consideration of starting Tikosyn or sotalol.  She is not a candidate for flecainide due to CAD.  We would like to avoid amiodarone if  possible.  # Hyperlipidemia: LDL 74 12/2016.  Continue atorvastatin.    # Hypertension: BP controlled.  Increase metoprolol and continue HCTZ.   # CAD: s/p coronary stent.  Not an active issue.   Current medicines are reviewed at length with the patient today.  The patient does not have concerns regarding medicines.  The following changes have been made:  Increase metoprolol  Labs/ tests ordered today include:   Orders Placed This Encounter  Procedures  . Ambulatory referral to Cardiac Electrophysiology  . ECHOCARDIOGRAM COMPLETE     Disposition:   FU with Francena Zender C. Oval Linsey, MD, [facility] in 2 months.    This note was written with the assistance of speech recognition software.  Please excuse any transcriptional errors.  Signed, Callin Ashe C. Oval Linsey, MD, [facility]  07/14/2017 12:45 PM    [place]

## 2017-07-15 DIAGNOSIS — I4821 Permanent atrial fibrillation: Secondary | ICD-10-CM

## 2017-07-15 HISTORY — DX: Permanent atrial fibrillation: I48.21

## 2017-07-17 ENCOUNTER — Ambulatory Visit: Payer: Federal, State, Local not specified - PPO | Admitting: Cardiovascular Disease

## 2017-07-25 ENCOUNTER — Other Ambulatory Visit (HOSPITAL_COMMUNITY): Payer: Federal, State, Local not specified - PPO

## 2017-07-26 ENCOUNTER — Other Ambulatory Visit (HOSPITAL_COMMUNITY): Payer: Federal, State, Local not specified - PPO

## 2017-07-27 ENCOUNTER — Emergency Department (HOSPITAL_COMMUNITY): Payer: Federal, State, Local not specified - PPO

## 2017-07-27 ENCOUNTER — Other Ambulatory Visit: Payer: Self-pay

## 2017-07-27 ENCOUNTER — Inpatient Hospital Stay (HOSPITAL_COMMUNITY)
Admission: EM | Admit: 2017-07-27 | Discharge: 2017-07-29 | DRG: 291 | Disposition: A | Discharge status: Home / Self Care | Payer: Federal, State, Local not specified - PPO | Attending: Internal Medicine | Admitting: Internal Medicine

## 2017-07-27 ENCOUNTER — Encounter (HOSPITAL_COMMUNITY): Payer: Self-pay | Admitting: Emergency Medicine

## 2017-07-27 ENCOUNTER — Ambulatory Visit (INDEPENDENT_AMBULATORY_CARE_PROVIDER_SITE_OTHER): Payer: Federal, State, Local not specified - PPO | Admitting: Internal Medicine

## 2017-07-27 ENCOUNTER — Ambulatory Visit (HOSPITAL_BASED_OUTPATIENT_CLINIC_OR_DEPARTMENT_OTHER): Payer: Federal, State, Local not specified - PPO

## 2017-07-27 ENCOUNTER — Encounter: Payer: Self-pay | Admitting: Internal Medicine

## 2017-07-27 VITALS — BP 138/88 | HR 94 | Ht 65.0 in | Wt 214.2 lb

## 2017-07-27 DIAGNOSIS — I481 Persistent atrial fibrillation: Secondary | ICD-10-CM | POA: Diagnosis not present

## 2017-07-27 DIAGNOSIS — K219 Gastro-esophageal reflux disease without esophagitis: Secondary | ICD-10-CM | POA: Diagnosis present

## 2017-07-27 DIAGNOSIS — I5021 Acute systolic (congestive) heart failure: Secondary | ICD-10-CM

## 2017-07-27 DIAGNOSIS — Z880 Allergy status to penicillin: Secondary | ICD-10-CM

## 2017-07-27 DIAGNOSIS — Z79899 Other long term (current) drug therapy: Secondary | ICD-10-CM | POA: Diagnosis not present

## 2017-07-27 DIAGNOSIS — Z7951 Long term (current) use of inhaled steroids: Secondary | ICD-10-CM | POA: Diagnosis not present

## 2017-07-27 DIAGNOSIS — E785 Hyperlipidemia, unspecified: Secondary | ICD-10-CM | POA: Diagnosis not present

## 2017-07-27 DIAGNOSIS — I429 Cardiomyopathy, unspecified: Secondary | ICD-10-CM | POA: Diagnosis not present

## 2017-07-27 DIAGNOSIS — I4891 Unspecified atrial fibrillation: Secondary | ICD-10-CM | POA: Diagnosis not present

## 2017-07-27 DIAGNOSIS — E119 Type 2 diabetes mellitus without complications: Secondary | ICD-10-CM | POA: Diagnosis not present

## 2017-07-27 DIAGNOSIS — I2510 Atherosclerotic heart disease of native coronary artery without angina pectoris: Secondary | ICD-10-CM | POA: Diagnosis present

## 2017-07-27 DIAGNOSIS — Z7901 Long term (current) use of anticoagulants: Secondary | ICD-10-CM

## 2017-07-27 DIAGNOSIS — E7800 Pure hypercholesterolemia, unspecified: Secondary | ICD-10-CM | POA: Diagnosis not present

## 2017-07-27 DIAGNOSIS — R079 Chest pain, unspecified: Secondary | ICD-10-CM | POA: Diagnosis not present

## 2017-07-27 DIAGNOSIS — I509 Heart failure, unspecified: Secondary | ICD-10-CM

## 2017-07-27 DIAGNOSIS — R0789 Other chest pain: Secondary | ICD-10-CM | POA: Diagnosis not present

## 2017-07-27 DIAGNOSIS — G4700 Insomnia, unspecified: Secondary | ICD-10-CM | POA: Diagnosis not present

## 2017-07-27 DIAGNOSIS — R072 Precordial pain: Secondary | ICD-10-CM

## 2017-07-27 DIAGNOSIS — Z905 Acquired absence of kidney: Secondary | ICD-10-CM | POA: Diagnosis not present

## 2017-07-27 DIAGNOSIS — Z87891 Personal history of nicotine dependence: Secondary | ICD-10-CM

## 2017-07-27 DIAGNOSIS — M797 Fibromyalgia: Secondary | ICD-10-CM | POA: Diagnosis present

## 2017-07-27 DIAGNOSIS — I25119 Atherosclerotic heart disease of native coronary artery with unspecified angina pectoris: Secondary | ICD-10-CM

## 2017-07-27 DIAGNOSIS — I5022 Chronic systolic (congestive) heart failure: Secondary | ICD-10-CM | POA: Insufficient documentation

## 2017-07-27 DIAGNOSIS — Z7984 Long term (current) use of oral hypoglycemic drugs: Secondary | ICD-10-CM | POA: Diagnosis not present

## 2017-07-27 DIAGNOSIS — Z955 Presence of coronary angioplasty implant and graft: Secondary | ICD-10-CM | POA: Diagnosis not present

## 2017-07-27 DIAGNOSIS — F418 Other specified anxiety disorders: Secondary | ICD-10-CM | POA: Diagnosis not present

## 2017-07-27 DIAGNOSIS — I10 Essential (primary) hypertension: Secondary | ICD-10-CM

## 2017-07-27 DIAGNOSIS — R51 Headache: Secondary | ICD-10-CM | POA: Diagnosis present

## 2017-07-27 DIAGNOSIS — M810 Age-related osteoporosis without current pathological fracture: Secondary | ICD-10-CM | POA: Diagnosis not present

## 2017-07-27 DIAGNOSIS — I110 Hypertensive heart disease with heart failure: Principal | ICD-10-CM | POA: Diagnosis present

## 2017-07-27 DIAGNOSIS — L900 Lichen sclerosus et atrophicus: Secondary | ICD-10-CM | POA: Diagnosis present

## 2017-07-27 DIAGNOSIS — I4819 Other persistent atrial fibrillation: Secondary | ICD-10-CM

## 2017-07-27 DIAGNOSIS — R0602 Shortness of breath: Secondary | ICD-10-CM | POA: Diagnosis not present

## 2017-07-27 HISTORY — DX: Other chronic pain: G89.29

## 2017-07-27 HISTORY — DX: Anemia, unspecified: D64.9

## 2017-07-27 HISTORY — DX: Prediabetes: R73.03

## 2017-07-27 HISTORY — DX: Hypothyroidism, unspecified: E03.9

## 2017-07-27 HISTORY — DX: Low back pain: M54.5

## 2017-07-27 HISTORY — DX: Headache: R51

## 2017-07-27 HISTORY — DX: Personal history of urinary calculi: Z87.442

## 2017-07-27 HISTORY — DX: Unspecified asthma, uncomplicated: J45.909

## 2017-07-27 HISTORY — DX: Malignant neoplasm of cervix uteri, unspecified: C53.9

## 2017-07-27 HISTORY — DX: Personal history of other diseases of the digestive system: Z87.19

## 2017-07-27 HISTORY — DX: Low back pain, unspecified: M54.50

## 2017-07-27 LAB — ECHOCARDIOGRAM COMPLETE
AVPHT: 538 ms
FS: 8 % — AB (ref 28–44)
IV/PV OW: 0.77
LA diam end sys: 46 mm
LA vol A4C: 66 ml
LA vol index: 34 mL/m2
LA vol: 67 mL
LADIAMINDEX: 2.34 cm/m2
LASIZE: 46 mm
LVOT area: 2.84 cm2
LVOT diameter: 19 mm
PW: 11.3 mm — AB (ref 0.6–1.1)

## 2017-07-27 LAB — BRAIN NATRIURETIC PEPTIDE: B NATRIURETIC PEPTIDE 5: 415.5 pg/mL — AB (ref 0.0–100.0)

## 2017-07-27 LAB — I-STAT TROPONIN, ED: Troponin i, poc: 0.01 ng/mL (ref 0.00–0.08)

## 2017-07-27 LAB — HEPATIC FUNCTION PANEL
ALBUMIN: 3.4 g/dL — AB (ref 3.5–5.0)
ALT: 20 U/L (ref 14–54)
AST: 21 U/L (ref 15–41)
Alkaline Phosphatase: 87 U/L (ref 38–126)
Bilirubin, Direct: <0.1 mg/dL — ABNORMAL LOW (ref 0.1–0.5)
TOTAL PROTEIN: 6.1 g/dL — AB (ref 6.5–8.1)
Total Bilirubin: 0.8 mg/dL (ref 0.3–1.2)

## 2017-07-27 LAB — CBC
HEMATOCRIT: 38.1 % (ref 36.0–46.0)
HEMOGLOBIN: 11.9 g/dL — AB (ref 12.0–15.0)
MCH: 28.7 pg (ref 26.0–34.0)
MCHC: 31.2 g/dL (ref 30.0–36.0)
MCV: 92 fL (ref 78.0–100.0)
Platelets: 250 10*3/uL (ref 150–400)
RBC: 4.14 MIL/uL (ref 3.87–5.11)
RDW: 14 % (ref 11.5–15.5)
WBC: 7.6 10*3/uL (ref 4.0–10.5)

## 2017-07-27 LAB — TROPONIN I: Troponin I: <0.03 ng/mL (ref <0.03)

## 2017-07-27 LAB — BASIC METABOLIC PANEL
Anion gap: 8 (ref 5–15)
BUN: 12 mg/dL (ref 6–20)
CHLORIDE: 108 mmol/L (ref 101–111)
CO2: 24 mmol/L (ref 22–32)
Calcium: 9.1 mg/dL (ref 8.9–10.3)
Creatinine, Ser: 0.75 mg/dL (ref 0.44–1.00)
GFR calc non Af Amer: >60 mL/min (ref >60)
Glucose, Bld: 93 mg/dL (ref 65–99)
POTASSIUM: 3.7 mmol/L (ref 3.5–5.1)
SODIUM: 140 mmol/L (ref 135–145)

## 2017-07-27 LAB — HEPARIN LEVEL (UNFRACTIONATED): Heparin Unfractionated: >2.2 IU/mL — ABNORMAL HIGH (ref 0.30–0.70)

## 2017-07-27 LAB — APTT: APTT: 37 s — AB (ref 24–36)

## 2017-07-27 LAB — LIPASE, BLOOD: LIPASE: 19 U/L (ref 11–51)

## 2017-07-27 MED ORDER — ALBUTEROL SULFATE (2.5 MG/3ML) 0.083% IN NEBU: 2.5000 mg | INHALATION_SOLUTION | Freq: Four times a day (QID) | RESPIRATORY_TRACT | Status: DC | PRN

## 2017-07-27 MED ORDER — LEVOTHYROXINE SODIUM 25 MCG PO TABS
125.0000 ug | ORAL_TABLET | Freq: Every day | ORAL | Status: DC
  Administered 2017-07-28 – 2017-07-29 (×2): 125 ug via ORAL
  Filled 2017-07-27 (×2): qty 1

## 2017-07-27 MED ORDER — ASPIRIN 81 MG PO CHEW
324.0000 mg | CHEWABLE_TABLET | Freq: Once | ORAL | Status: AC
  Administered 2017-07-27: 324 mg via ORAL
  Filled 2017-07-27: qty 4

## 2017-07-27 MED ORDER — METOPROLOL TARTRATE 100 MG PO TABS
100.0000 mg | ORAL_TABLET | Freq: Two times a day (BID) | ORAL | Status: DC
  Administered 2017-07-27 – 2017-07-28 (×2): 100 mg via ORAL
  Filled 2017-07-27 (×2): qty 1

## 2017-07-27 MED ORDER — SODIUM CHLORIDE 0.9% FLUSH
3.0000 mL | Freq: Two times a day (BID) | INTRAVENOUS | Status: DC
  Administered 2017-07-27 – 2017-07-29 (×4): 3 mL via INTRAVENOUS

## 2017-07-27 MED ORDER — DULOXETINE HCL 60 MG PO CPEP
60.0000 mg | ORAL_CAPSULE | Freq: Every day | ORAL | Status: DC
  Administered 2017-07-28 – 2017-07-29 (×2): 60 mg via ORAL
  Filled 2017-07-27 (×2): qty 1

## 2017-07-27 MED ORDER — HEPARIN (PORCINE) IN NACL 100-0.45 UNIT/ML-% IJ SOLN
1200.0000 [IU]/h | INTRAMUSCULAR | Status: DC
  Administered 2017-07-27: 1050 [IU]/h via INTRAVENOUS
  Administered 2017-07-28: 1200 [IU]/h via INTRAVENOUS
  Filled 2017-07-27 (×2): qty 250

## 2017-07-27 MED ORDER — FUROSEMIDE 10 MG/ML IJ SOLN
20.0000 mg | Freq: Every day | INTRAMUSCULAR | Status: DC
  Administered 2017-07-27 – 2017-07-29 (×3): 20 mg via INTRAVENOUS
  Filled 2017-07-27 (×3): qty 2

## 2017-07-27 MED ORDER — PANTOPRAZOLE SODIUM 40 MG PO TBEC
40.0000 mg | DELAYED_RELEASE_TABLET | Freq: Every day | ORAL | Status: DC
  Administered 2017-07-28 – 2017-07-29 (×2): 40 mg via ORAL
  Filled 2017-07-27 (×2): qty 1

## 2017-07-27 MED ORDER — SODIUM CHLORIDE 0.9% FLUSH: 3.0000 mL | INTRAVENOUS | Status: DC | PRN

## 2017-07-27 MED ORDER — ONDANSETRON HCL 4 MG/2ML IJ SOLN: 4.0000 mg | Freq: Four times a day (QID) | INTRAMUSCULAR | Status: DC | PRN

## 2017-07-27 MED ORDER — ATORVASTATIN CALCIUM 40 MG PO TABS
40.0000 mg | ORAL_TABLET | Freq: Every day | ORAL | Status: DC
  Administered 2017-07-27 – 2017-07-28 (×2): 40 mg via ORAL
  Filled 2017-07-27 (×2): qty 1

## 2017-07-27 MED ORDER — SODIUM CHLORIDE 0.9 % IV SOLN: 250.0000 mL | INTRAVENOUS | Status: DC | PRN

## 2017-07-27 MED ORDER — ACETAMINOPHEN 325 MG PO TABS
650.0000 mg | ORAL_TABLET | ORAL | Status: DC | PRN
  Administered 2017-07-27: 650 mg via ORAL
  Filled 2017-07-27: qty 2

## 2017-07-27 MED ORDER — ZOLPIDEM TARTRATE 5 MG PO TABS
5.0000 mg | ORAL_TABLET | Freq: Every evening | ORAL | Status: DC | PRN
  Administered 2017-07-27 – 2017-07-28 (×2): 5 mg via ORAL
  Filled 2017-07-27 (×2): qty 1

## 2017-07-27 NOTE — Progress Notes (Signed)
ANTICOAGULATION CONSULT NOTE - Initial Consult  Pharmacy Consult for heparin Indication: atrial fibrillation  Allergies  Allergen Reactions  . Penicillins Swelling and Rash    Patient Measurements: Height: 5\' 4"  (162.6 cm) Weight: 214 lb (97.1 kg) IBW/kg (Calculated) : 54.7 Heparin Dosing Weight: 77 kg  Assessment: 61 yo F presents on 12/13 with SOB. Hx of Afib on Xarelto PTA but last dose is unknown. Baseline heparin level elevated and aPTT normal. Hgb 11.9, [name],[name], CBC, [name], [name], [facility] Clinical Pharmacist Pager [phone] 07/27/2017 8:56 PM

## 2017-07-27 NOTE — ED Notes (Signed)
Attempted report x 1; name and call back number provided 

## 2017-07-27 NOTE — Progress Notes (Signed)
Follow-up Outpatient Visit Date: 07/27/2017  Primary Care Provider: Colon Branch, [facility] [address] [place] [id number]  Chief Complaint: Shortness of breath  HPI:  Melinda Mckinney is a 61 y.o. year-old female with history of coronary artery disease status post remote PCI in [place], persistent atrial fibrillation (recent onset), hypertension, hyperlipidemia, diabetes mellitus, and fibromyalgia, who presents as an acute visit for the DOD. Melinda Mckinney was first evaluated for new onset of atrial fibrillation by Dr. Oval Linsey in mid November. At that time, metoprolol was increased with reduction of HCTZ. She presented to the ED 5 days later with palpitations, vomiting, and headache. She underwent cardioversion in the ED with restoration of sinus rhythm. She subtotally followed up with Dr. Oval Linsey on 07/14/17, at which time she was found to be back in atrial fibrillation with a heart rate in the low 100s. She complained of fatigue at that time, prompting referral for echocardiogram. Metoprolol was continued and diltiazem added for improved heart rate control.  Today, Melinda Mckinney presented for transthoracic echocardiogram. She was noted to be very short of breath by the sonographer first, who alerted [place]. Melinda Mckinney notes that over the last 4 days, she has had significant shortness of breath with exertion and now even at rest. She has chronic orthopnea, which has increased to 3-4 pillows over the last few days. She also endorses PND and abdominal distention. She has not had any significant leg edema. She does not describe frank chest pain but feels as though there is some faint tightness in the center of her chest, as if she cannot take a deep breath. She notes intermittent palpitations as well. She has been compliant with her medications, including diltiazem and rivaroxaban.  --------------------------------------------------------------------------------------------------  Past Medical History:    Diagnosis Date  . Abnormal Pap smear of cervix   . Anxiety and depression   . CAD (coronary artery disease) ~2009   stent   . Cervical dysplasia   . DDD (degenerative disc disease), lumbar    Dr. Nelva Bush, recommends lumbar epidural steroid injections L5-S1 to the right  . Fibromyalgia   . GERD (gastroesophageal reflux disease)   . High cholesterol   . Hypertension   . Insomnia 09/27/2013  . Lichen sclerosus    Vulvar area  . Osteoporosis   . S/p nephrectomy   . Thyroid disease   . Vitamin D deficiency    Past Surgical History:  Procedure Laterality Date  . CESAREAN SECTION     x 3   . esophagomyomectomy (heller)  2009  . GYNECOLOGIC CRYOSURGERY     X2  . HERNIA REPAIR     x 6 surgeries   . KNEE ARTHROSCOPY Right   . NEPHRECTOMY Right 1991   in [place], due to fibrosis and lithiasis   Current Meds  Medication Sig  . albuterol (VENTOLIN HFA) 108 (90 Base) MCG/ACT inhaler Inhale 2 puffs into the lungs every 6 (six) hours as needed for wheezing or shortness of breath.  . allopurinol (ZYLOPRIM) 100 MG tablet Take 1 tablet (100 mg total) by mouth daily.  Marland Kitchen atorvastatin (LIPITOR) 40 MG tablet Take 1 tablet (40 mg total) by mouth at bedtime.  . Cholecalciferol (VITAMIN D3) 5000 UNITS CAPS Take 1 capsule by mouth.  . clobetasol cream (TEMOVATE) 0.86 % Apply 1 application topically 2 (two) times daily. Apply twice a week first week then once weekly as needed  . cyclobenzaprine (FLEXERIL) 10 MG tablet Take 1 tablet (  10 mg total) by mouth 2 (two) times daily as needed for muscle spasms.  Marland Kitchen diltiazem (DILACOR XR) 120 MG 24 hr capsule Take 1 capsule (120 mg total) by mouth daily.  . DULoxetine (CYMBALTA) 60 MG capsule Take 1 capsule (60 mg total) daily by mouth.  . hydrochlorothiazide (HYDRODIURIL) 50 MG tablet Take 50 mg by mouth as directed. 1/2 TABLET BY MOUTH DAILY  . levothyroxine (SYNTHROID, LEVOTHROID) 125 MCG tablet Take 1 tablet (125 mcg total) by mouth daily before  breakfast.  . metFORMIN (GLUCOPHAGE) 500 MG tablet Take 1 tablet (500 mg total) by mouth 2 (two) times daily with a meal.  . metoprolol tartrate (LOPRESSOR) 100 MG tablet Take 1 tablet (100 mg total) 2 (two) times daily by mouth.  Marland Kitchen omeprazole (PRILOSEC) 20 MG capsule Take 1 capsule (20 mg total) by mouth daily.  . raloxifene (EVISTA) 60 MG tablet Take 1 tablet (60 mg total) by mouth daily.  . rivaroxaban (XARELTO) 20 MG TABS tablet Take 1 tablet (20 mg total) daily with supper by mouth.  . zolpidem (AMBIEN) 5 MG tablet Take 1 tablet (5 mg total) by mouth at bedtime as needed for sleep.    Allergies: Penicillins  Social History   Socioeconomic History  . Marital status: Married    Spouse name: Not on file  . Number of children: 3  . Years of education: Not on file  . Highest education level: Not on file  Social Needs  . Financial resource strain: Not on file  . Food insecurity - worry: Not on file  . Food insecurity - inability: Not on file  . Transportation needs - medical: Not on file  . Transportation needs - non-medical: Not on file  Occupational History  . 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 Smoking status: Former Research scientist (life sciences)  . Smokeless tobacco: Never Used  . Tobacco comment: quit ~ 2000 (2 ppd)  Substance and Sexual Activity  . Alcohol use: Yes    Alcohol/week: 0.0 oz    Comment: socially   . Drug use: No  . Sexual activity: No    Comment: 1ST INTERCOURSE- 27, PARTNERS - 2  Other Topics Concern  . Not on file  Social History Narrative   Original from [place]   3 children, 2 alive   Household --pt and husband     Family History  Problem Relation Age of Onset  . Breast cancer Other        aunt   . CAD Brother   . Lung cancer Mother        F and M  . Diabetes Father        F and mother   . CAD Father   . Lung cancer Father   . Stroke Sister   . Colon cancer Neg Hx     Review of Systems: A 12-system review of systems was performed and was negative  except as noted in the HPI.  --------------------------------------------------------------------------------------------------  Physical Exam: BP 138/88   Pulse 94   Ht 5\' 5"  (1.651 m)   Wt 214 lb 4 oz (97.2 kg)   SpO2 97%   BMI 35.65 kg/m   General:  Obese woman, lying on exam table. She appears slightly anxious and short of breath. HEENT: No conjunctival pallor or scleral icterus. Moist mucous membranes.  OP clear. Neck: Supple without lymphadenopathy or thyromegaly. JVP is 8-10 cm with positive HJR. Lungs: Mildly increased work of breathing. Diminished breath sounds at the  lung bases, left greater than right. No wheezes or crackles. Heart: Irregularly irregular with 1/6 systolic murmur loudest at the left lower sternal border. No rubs or gallops. Unable to assess PMI due to body habitus. Abd: Bowel sounds present. Mildly distended with diffuse upper abdominal tenderness. Unable to assess HSM body habitus. Ext: No lower extremity edema. Radial, PT, and DP pulses are 2+ bilaterally. Skin: Warm and dry without rash.  EKG:  Atrial fibrillation (ventricular rate 77 bpm) with non-specific ST changes.  Echo (prelim): Biventricular dysfunction with severely reduced LVEF. At least moderate mitral and tricuspid regurgitation. Mild aortic regurgitation. Elevated central venous pressure. Left pleural effusion.  Lab Results  Component Value Date   WBC 6.9 07/05/2017   HGB 12.7 07/05/2017   HCT 39.4 07/05/2017   MCV 91.8 07/05/2017   PLT 237 07/05/2017    Lab Results  Component Value Date   NA 140 07/05/2017   K 3.4 (L) 07/05/2017   CL 107 07/05/2017   CO2 24 07/05/2017   BUN 14 07/05/2017   CREATININE 0.73 07/05/2017   GLUCOSE 118 (H) 07/05/2017   ALT 25 07/05/2017    Lab Results  Component Value Date   CHOL 176 01/06/2017   HDL 81.50 01/06/2017   LDLCALC 74 01/06/2017   LDLDIRECT 76.0 02/06/2015   TRIG 100.0 01/06/2017   CHOLHDL 2 01/06/2017     --------------------------------------------------------------------------------------------------  ASSESSMENT AND PLAN: Acute systolic heart failure Patient's symptoms are consistent with acute decompensated heart failure. Echo shows biventricular dysfunction and regurgitation of multiple valves. Given NYHA class IV symptoms, I have referred Melinda Mckinney to the emergency department for further evaluation and admission. She will need IV diuresis and discontinuation of diltiazem. I am hopeful that her cardiomyopathy is related to recent atrial fibrillation with rapid ventricular response. However, in light of her history of CAD, she will likely need ischemia evaluation at some point when she has been optimized from a volume standpoint.  Persistent atrial fibrillation Unfortunately, the patient remains in atrial fibrillation today albeit with adequate rate control. I recommend holding diltiazem, as above, in the setting of acute systolic heart failure with severely reduced LVEF. Anticoagulation should be continued, though temporary transition to IV heparin will need to be considered in case invasive procedures such as cardiac catheterization are necessary during this hospitalization.  Coronary artery disease Patient describes vague chest discomfort and shortness of breath. I suspect this is most consistent with her heart failure exacerbation. However, ischemia evaluation will need to be pursued, given severely reduced LVEF. Continue statin therapy.  Hypertension Blood pressure is reasonably well controlled today.  Hyperlipidemia Continue atorvastatin.  Disposition: Patient referred to the emergency department. She should follow-up with Dr. Oval Linsey after discharge.  Nelva Bush, MD 07/27/2017 5:13 PM

## 2017-07-27 NOTE — H&P (Signed)
H&P Date: 07/27/2017  Primary Care Provider: Colon Branch, [address] [place] [address]  Chief Complaint: Shortness of breath  HPI:  Ms. Licklider is a 61 y.o. year-old female with history of coronary artery disease status post remote PCI in [place], persistent atrial fibrillation (recent onset), hypertension, hyperlipidemia, diabetes mellitus, and fibromyalgia, who presents as an acute visit for the DOD. Ms. Devera was first evaluated for new onset of atrial fibrillation by Dr. Oval Linsey in mid November. At that time, metoprolol was increased with reduction of HCTZ. She presented to the ED 5 days later with palpitations, vomiting, and headache. She underwent cardioversion in the ED with restoration of sinus rhythm. She subtotally followed up with Dr. Oval Linsey on 07/14/17, at which time she was found to be back in atrial fibrillation with a heart rate in the low 100s. She complained of fatigue at that time, prompting referral for echocardiogram. Metoprolol was continued and diltiazem added for improved heart rate control.  Today, Ms. Dovel presented for transthoracic echocardiogram. She was noted to be very short of breath by the sonographer first, who alerted [place]. Ms. Dalgleish notes that over the last 4 days, she has had significant shortness of breath with exertion and now even at rest. She has chronic orthopnea, which has increased to 3-4 pillows over the last few days. She also endorses PND and abdominal distention. She has not had any significant leg edema. She does not describe frank chest pain but feels as though there is some faint tightness in the center of her chest, as if she cannot take a deep breath. She notes intermittent palpitations as well. She has been compliant with her medications, including diltiazem and rivaroxaban.  --------------------------------------------------------------------------------------------------  Past Medical History:  Diagnosis Date  .  Abnormal Pap smear of cervix   . Anxiety and depression   . CAD (coronary artery disease) ~2009   stent   . Cervical dysplasia   . DDD (degenerative disc disease), lumbar    Dr. Nelva Bush, recommends lumbar epidural steroid injections L5-S1 to the right  . Fibromyalgia   . GERD (gastroesophageal reflux disease)   . High cholesterol   . Hypertension   . Insomnia 09/27/2013  . Lichen sclerosus    Vulvar area  . Osteoporosis   . S/p nephrectomy   . Thyroid disease   . Vitamin D deficiency    Past Surgical History:  Procedure Laterality Date  . CESAREAN SECTION     x 3   . esophagomyomectomy (heller)  2009  . GYNECOLOGIC CRYOSURGERY     X2  . HERNIA REPAIR     x 6 surgeries   . KNEE ARTHROSCOPY Right   . NEPHRECTOMY Right 1991   in [place], due to fibrosis and lithiasis   Current Meds  Medication Sig  . albuterol (VENTOLIN HFA) 108 (90 Base) MCG/ACT inhaler Inhale 2 puffs into the lungs every 6 (six) hours as needed for wheezing or shortness of breath.  . allopurinol (ZYLOPRIM) 100 MG tablet Take 1 tablet (100 mg total) by mouth daily.  Marland Kitchen atorvastatin (LIPITOR) 40 MG tablet Take 1 tablet (40 mg total) by mouth at bedtime.  . Cholecalciferol (VITAMIN D3) 5000 UNITS CAPS Take 1 capsule by mouth.  . clobetasol cream (TEMOVATE) 3.66 % Apply 1 application topically 2 (two) times daily. Apply twice a week first week then once weekly as needed  . cyclobenzaprine (FLEXERIL) 10 MG tablet Take 1 tablet (10 mg total)  by mouth 2 (two) times daily as needed for muscle spasms.  Marland Kitchen diltiazem (DILACOR XR) 120 MG 24 hr capsule Take 1 capsule (120 mg total) by mouth daily.  . DULoxetine (CYMBALTA) 60 MG capsule Take 1 capsule (60 mg total) daily by mouth.  . hydrochlorothiazide (HYDRODIURIL) 50 MG tablet Take 25 mg by mouth daily.   Marland Kitchen levothyroxine (SYNTHROID, LEVOTHROID) 125 MCG tablet Take 1 tablet (125 mcg total) by mouth daily before breakfast.  . metFORMIN (GLUCOPHAGE) 500 MG tablet Take 1  tablet (500 mg total) by mouth 2 (two) times daily with a meal.  . metoprolol tartrate (LOPRESSOR) 100 MG tablet Take 1 tablet (100 mg total) 2 (two) times daily by mouth.  Marland Kitchen omeprazole (PRILOSEC) 20 MG capsule Take 1 capsule (20 mg total) by mouth daily.  . raloxifene (EVISTA) 60 MG tablet Take 1 tablet (60 mg total) by mouth daily.  . rivaroxaban (XARELTO) 20 MG TABS tablet Take 1 tablet (20 mg total) daily with supper by mouth.  . zolpidem (AMBIEN) 5 MG tablet Take 1 tablet (5 mg total) by mouth at bedtime as needed for sleep.    Allergies: Penicillins  Social History   Socioeconomic History  . Marital status: Married    Spouse name: Not on file  . Number of children: 3  . Years of education: Not on file  . Highest education level: Not on file  Social Needs  . Financial resource strain: Not on file  . Food insecurity - worry: Not on file  . Food insecurity - inability: Not on file  . Transportation needs - medical: Not on file  . Transportation needs - non-medical: Not on file  Occupational History  . 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 Smoking status: Former Research scientist (life sciences)  . Smokeless tobacco: Never Used  . Tobacco comment: quit ~ 2000 (2 ppd)  Substance and Sexual Activity  . Alcohol use: Yes    Alcohol/week: 0.0 oz    Comment: socially   . Drug use: No  . Sexual activity: No    Comment: 1ST INTERCOURSE- 44, PARTNERS - 2  Other Topics Concern  . Not on file  Social History Narrative   Original from [place]   3 children, 2 alive   Household --pt and husband     Family History  Problem Relation Age of Onset  . Breast cancer Other        aunt   . CAD Brother   . Lung cancer Mother        F and M  . Diabetes Father        F and mother   . CAD Father   . Lung cancer Father   . Stroke Sister   . Colon cancer Neg Hx     Review of Systems: A 12-system review of systems was performed and was negative except as noted in the  HPI.  --------------------------------------------------------------------------------------------------  Physical Exam: BP (!) 137/92 (BP Location: Right Arm)   Pulse 87   Temp 97.8 F (36.6 C) (Oral)   Resp 19   Ht 5\' 4"  (1.626 m)   Wt 214 lb (97.1 kg)   SpO2 100%   BMI 36.73 kg/m   General:  Obese woman, lying on exam table. She appears slightly anxious and short of breath. HEENT: No conjunctival pallor or scleral icterus. Moist mucous membranes.  OP clear. Neck: Supple without lymphadenopathy or thyromegaly. JVP is 8-10 cm with positive HJR. Lungs: Mildly increased work  of breathing. Diminished breath sounds at the lung bases, left greater than right. No wheezes or crackles. Heart: Irregularly irregular with 1/6 systolic murmur loudest at the left lower sternal border. No rubs or gallops. Unable to assess PMI due to body habitus. Abd: Bowel sounds present. Mildly distended with diffuse upper abdominal tenderness. Unable to assess HSM body habitus. Ext: No lower extremity edema. Radial, PT, and DP pulses are 2+ bilaterally. Skin: Warm and dry without rash.  EKG:  Atrial fibrillation (ventricular rate 77 bpm) with non-specific ST changes.  Echo (prelim): Biventricular dysfunction with severely reduced LVEF. At least moderate mitral and tricuspid regurgitation. Mild aortic regurgitation. Elevated central venous pressure. Left pleural effusion.  Lab Results  Component Value Date   WBC 7.6 07/27/2017   HGB 11.9 (L) 07/27/2017   HCT 38.1 07/27/2017   MCV 92.0 07/27/2017   PLT 250 07/27/2017    Lab Results  Component Value Date   NA 140 07/27/2017   K 3.7 07/27/2017   CL 108 07/27/2017   CO2 24 07/27/2017   BUN 12 07/27/2017   CREATININE 0.75 07/27/2017   GLUCOSE 93 07/27/2017   ALT 25 07/05/2017    Lab Results  Component Value Date   CHOL 176 01/06/2017   HDL 81.50 01/06/2017   LDLCALC 74 01/06/2017   LDLDIRECT 76.0 02/06/2015   TRIG 100.0 01/06/2017   CHOLHDL 2  01/06/2017    --------------------------------------------------------------------------------------------------  ASSESSMENT AND PLAN: Acute systolic heart failure Patient's symptoms are consistent with acute decompensated heart failure. Echo shows biventricular dysfunction and regurgitation of multiple valves. Given NYHA class IV symptoms, I have referred Ms. Furlan to the emergency department for further evaluation and admission. She will need IV diuresis and discontinuation of diltiazem. I am hopeful that her cardiomyopathy is related to recent atrial fibrillation with rapid ventricular response. However, in light of her history of CAD, she will likely need ischemia evaluation at some point when she has been optimized from a volume standpoint.  Persistent atrial fibrillation Unfortunately, the patient remains in atrial fibrillation today albeit with adequate rate control. I recommend holding diltiazem, as above, in the setting of acute systolic heart failure with severely reduced LVEF. Anticoagulation should be continued, though temporary transition to IV heparin will need to be considered in case invasive procedures such as cardiac catheterization are necessary during this hospitalization.  Coronary artery disease Patient describes vague chest discomfort and shortness of breath. I suspect this is most consistent with her heart failure exacerbation. However, ischemia evaluation will need to be pursued, given severely reduced LVEF. Continue statin therapy.  Hypertension Blood pressure is reasonably well controlled today.  Hyperlipidemia Continue atorvastatin.  Disposition: Patient referred to the emergency department. She should follow-up with Dr. Oval Linsey after discharge.  Nelva Bush, MD 07/27/2017 8:14 PM   Addendum:   Patient seen in the ED with Dr. Debara Pickett. Mostly c/o headache, chest pressure and abd fullness. Bothered by the lights in the room. No significant volume overload  noted on exam. HR controlled on admission.  General: Well developed, well nourished, female appearing in no acute distress. Head: Normocephalic, atraumatic.  Neck: Supple without bruits, JVD. Lungs:  Resp regular and unlabored, CTA. Heart: Irreg Irref, S1, S2, no S3, S4, or murmur; no rub. Abdomen: Soft, non-tender, non-distended with normoactive bowel sounds.  Extremities: No clubbing, cyanosis, edema. Distal pedal pulses are 2+ bilaterally.  Neuro: Alert and oriented X 3. Moves all extremities spontaneously. Psych: Normal affect.  A/P: - admit to tele - IV  lasix 20mg  x1. Reports only having left kidney ( underwent removal while she lived in [place]) - Head CT pending, but plan to start IV heparin if negative and hold Xarelto if plan is for ischemic eval this admission - continued BB and held [place] - BMET in am  Signed, Reino Bellis, NP-C 07/27/2017, 8:22 PM Pager: [phone]

## 2017-07-27 NOTE — ED Triage Notes (Signed)
Pt states seen for headahce and afib rvr. Pt had an echo, followed up cardiologist and they told her to come to ER for fluid retention earlier today.

## 2017-07-27 NOTE — Patient Instructions (Signed)
:   Dr End recommends that you go to [facility] Emergency Room now for further evaluation. Go to the Main Entrance A/[address] Tower of [employer] signs to the Emergency Room.

## 2017-07-27 NOTE — ED Provider Notes (Signed)
[facility] Provider Note   CSN: [id number] Arrival date & time: 07/27/17  1728     History   Chief Complaint Chief Complaint  Patient presents with  . Congestive Heart Failure  . Headache  . Shortness of Breath    HPI Melinda Mckinney is a 61 y.o. female.  HPI   Since Sunday began to feel more short of breath, increased fatigue, dyspnea with climbing the stairs.  Last night woke up in the middle of the night short of breath. Feels dyspnea when she lays down flat.  Reports having some chest pressure since Sunday. Is constant. Worse with exertion.  No nausea or vomiting.  No diaphoresis. Sweating.  Headache started slowly yesterday and got worse today.  Frontal headache.  No falls or trauma.  Fell last Friday.  On xarelto.  Has had hx of headaches in the past.  Husband assisting with spanish translation  Past Medical History:  Diagnosis Date  . Abnormal Pap smear of cervix   . Anemia   . Anxiety and depression   . Asthma   . Borderline diabetes   . CAD (coronary artery disease) ~2009   stent   . Cervical cancer ([place])    "stage 1; had cryotherapy"  . Cervical dysplasia   . Chronic lower back pain   . DDD (degenerative disc disease), lumbar    Dr. Nelva Bush, recommends lumbar epidural steroid injections L5-S1 to the right  . Depression   . Fibromyalgia   . GERD (gastroesophageal reflux disease)   . Headache    "only when I'm short of breath"  (07/27/2017)  . High cholesterol   . History of hiatal hernia   . History of kidney stones   . Hypertension   . Hypothyroidism   . Insomnia 09/27/2013  . Lichen sclerosus    Vulvar area  . Osteoporosis   . Thyroid disease   . Vitamin D deficiency     Patient Active Problem List   Diagnosis Date Noted  . Acute systolic heart failure ([place]) 07/27/2017  . Acute congestive heart failure ([place]) 07/27/2017  . Precordial pain   . Atrial fibrillation ([place]) 06/29/2017  . Diabetes mellitus, type II ([place])  01/06/2017  . PCP NOTES >>>> 06/16/2015  . Back pain 02/05/2015  . Hypothyroidism 01/19/2015  . Vitamin D deficiency   . Vulvar atrophy 08/20/2014  . Annual physical exam 07/24/2014  . Anemia 07/24/2014  . Insomnia 09/27/2013  . Intertrigo 09/27/2013  . Urolithiasis 09/27/2013  . Essential hypertension   . GERD, h/o achalasia s/p [place] ~2009)   . Hyperlipidemia LDL goal <70   . Coronary artery disease involving native coronary artery of native heart with angina pectoris ([place])   . Osteoporosis   . Fibromyalgia     Past Surgical History:  Procedure Laterality Date  . CARDIAC CATHETERIZATION  ~ 2008  . [place]; ~ 1978/1979; 1984  . ESOPHAGOMYOTOMY  2009  . GYNECOLOGIC CRYOSURGERY  X 2   "stage 1 cancer"  . HERNIA REPAIR  X 6   "all in my stomach" (07/27/2017)  . KNEE ARTHROSCOPY Right   . NEPHRECTOMY Right 1991   in [place], due to fibrosis and lithiasis  . TUBAL LIGATION      OB History    Gravida Para Term Preterm AB Living   4 2     2 2    SAB TAB Ectopic Multiple Live Births   2  Home Medications    Prior to Admission medications   Medication Sig Start Date End Date Taking? Authorizing Provider  albuterol (VENTOLIN HFA) 108 (90 Base) MCG/ACT inhaler Inhale 2 puffs into the lungs every 6 (six) hours as needed for wheezing or shortness of breath. 04/25/17  Yes Paz, Alda Berthold, MD  allopurinol (ZYLOPRIM) 100 MG tablet Take 1 tablet (100 mg total) by mouth daily. 06/29/16  Yes Paz, Alda Berthold, MD  atorvastatin (LIPITOR) 40 MG tablet Take 1 tablet (40 mg total) by mouth at bedtime. 06/29/16  Yes Paz, Alda Berthold, MD  Cholecalciferol (VITAMIN D3) 5000 UNITS CAPS Take 1 capsule by mouth.   Yes [provider]  clobetasol cream (TEMOVATE) 1.32 % Apply 1 application topically 2 (two) times daily. Apply twice a week first week then once weekly as needed 01/06/17  Yes Paz, Alda Berthold, MD  cyclobenzaprine (FLEXERIL) 10 MG tablet Take 1 tablet (10 mg  total) by mouth 2 (two) times daily as needed for muscle spasms. 02/24/16  Yes Paz, Alda Berthold, MD  diltiazem (DILACOR XR) 120 MG 24 hr capsule Take 1 capsule (120 mg total) by mouth daily. 07/14/17  Yes Skeet Latch, MD  DULoxetine (CYMBALTA) 60 MG capsule Take 1 capsule (60 mg total) daily by mouth. 07/03/17  Yes Paz, Alda Berthold, MD  hydrochlorothiazide (HYDRODIURIL) 50 MG tablet Take 25 mg by mouth daily.    Yes [provider]  levothyroxine (SYNTHROID, LEVOTHROID) 125 MCG tablet Take 1 tablet (125 mcg total) by mouth daily before breakfast. 05/01/17  Yes Colon Branch, MD  metFORMIN (GLUCOPHAGE) 500 MG tablet Take 1 tablet (500 mg total) by mouth 2 (two) times daily with a meal. 06/29/16  Yes Paz, Alda Berthold, MD  metoprolol tartrate (LOPRESSOR) 100 MG tablet Take 1 tablet (100 mg total) 2 (two) times daily by mouth. 06/30/17  Yes Skeet Latch, MD  omeprazole (PRILOSEC) 20 MG capsule Take 1 capsule (20 mg total) by mouth daily. 06/29/16  Yes Paz, Alda Berthold, MD  raloxifene (EVISTA) 60 MG tablet Take 1 tablet (60 mg total) by mouth daily. 09/08/16  Yes Terrance Mass, MD  rivaroxaban (XARELTO) 20 MG TABS tablet Take 1 tablet (20 mg total) daily with supper by mouth. 06/29/17  Yes Paz, Alda Berthold, MD  zolpidem (AMBIEN) 5 MG tablet Take 1 tablet (5 mg total) by mouth at bedtime as needed for sleep. 09/08/16  Yes Terrance Mass, MD    Family History Family History  Problem Relation Age of Onset  . Breast cancer Other        aunt   . CAD Brother   . Lung cancer Mother        F and M  . Diabetes Father        F and mother   . CAD Father   . Lung cancer Father   . Stroke Sister   . Colon cancer Neg Hx     Social History Social History   Tobacco Use  . Smoking status: Former Smoker    Packs/day: 2.00    Years: 28.00    Pack years: 56.00    Types: Cigarettes    Last attempt to quit: 2000    Years since quitting: 18.9  . Smokeless tobacco: Never Used  Substance Use Topics  .  Alcohol use: Yes    Comment: 07/27/2017 "3-4 drinks//month"  . Drug use: No     Allergies   Penicillins   Review of Systems Review of  Systems  Constitutional: Positive for fatigue. Negative for fever.  HENT: Negative for sore throat.   Eyes: Negative for visual disturbance.  Respiratory: Positive for shortness of breath. Negative for cough.   Cardiovascular: Positive for chest pain and palpitations. Negative for leg swelling.  Gastrointestinal: Negative for abdominal pain, nausea and vomiting.  Genitourinary: Negative for difficulty urinating and dysuria.  Musculoskeletal: Negative for back pain and neck pain.  Skin: Negative for rash.  Neurological: Positive for light-headedness and headaches. Negative for syncope, facial asymmetry, weakness and numbness.     Physical Exam Updated Vital Signs BP (!) 136/92 (BP Location: Left Arm)   Pulse 92   Temp 97.8 F (36.6 C) (Oral)   Resp 20   Ht 5\' 4"  (1.626 m)   Wt 95.3 kg (210 lb 1.6 oz) Comment: scale A  SpO2 96%   BMI 36.06 kg/m   Physical Exam  Constitutional: She is oriented to person, place, and time. She appears well-developed and well-nourished. No distress.  HENT:  Head: Normocephalic and atraumatic.  Eyes: Conjunctivae and EOM are normal.  Neck: Normal range of motion.  Cardiovascular: Normal rate, normal heart sounds and intact distal pulses. An irregularly irregular rhythm present. Exam reveals no gallop and no friction rub.  No murmur heard. Pulmonary/Chest: Effort normal and breath sounds normal. No respiratory distress. She has no wheezes. She has no rales.  Abdominal: Soft. She exhibits no distension. There is tenderness (reports mild epigastric discomfort). There is no guarding.  Musculoskeletal: She exhibits no edema or tenderness.  Neurological: She is alert and oriented to person, place, and time.  Skin: Skin is warm and dry. No rash noted. She is not diaphoretic. No erythema.  Nursing note and vitals  reviewed.    ED Treatments / Results  Labs (all labs ordered are listed, but only abnormal results are displayed) Labs Reviewed  CBC - Abnormal; Notable for the following components:      Result Value   Hemoglobin 11.9 (*)    All other components within normal limits  BRAIN NATRIURETIC PEPTIDE - Abnormal; Notable for the following components:   B Natriuretic Peptide 415.5 (*)    All other components within normal limits  HEPATIC FUNCTION PANEL - Abnormal; Notable for the following components:   Total Protein 6.1 (*)    Albumin 3.4 (*)    Bilirubin, Direct <0.1 (*)    All other components within normal limits  APTT - Abnormal; Notable for the following components:   aPTT 37 (*)    All other components within normal limits  HEPARIN LEVEL (UNFRACTIONATED) - Abnormal; Notable for the following components:   Heparin Unfractionated >2.20 (*)    All other components within normal limits  APTT - Abnormal; Notable for the following components:   aPTT 68 (*)    All other components within normal limits  BASIC METABOLIC PANEL - Abnormal; Notable for the following components:   Glucose, Bld 103 (*)    All other components within normal limits  TSH - Abnormal; Notable for the following components:   TSH 4.801 (*)    All other components within normal limits  BASIC METABOLIC PANEL  LIPASE, BLOOD  CBC  TROPONIN I  TROPONIN I  HIV ANTIBODY (ROUTINE TESTING)  TROPONIN I  APTT  I-STAT TROPONIN, ED    EKG  EKG Interpretation  Date/Time:  Thursday July 27 2017 17:39:08 EST Ventricular Rate:  87 PR Interval:    QRS Duration: 92 QT Interval:  330 QTC Calculation:  397 R Axis:   53 Text Interpretation:  Atrial fibrillation Abnormal ECG Since prior ECG, she is now in atrial fibrillation Confirmed by Gareth Morgan [phone]) on 07/27/2017 7:08:21 PM Also confirmed by Gareth Morgan [phone]), editor Laurena Spies [phone])  on 07/28/2017 7:02:59 AM       Radiology Dg Chest 2  View  Result Date: 07/27/2017 CLINICAL DATA:  Shortness of breath for 1 day. Coronary artery disease and hypertension. Atrial fibrillation. EXAM: CHEST  2 VIEW COMPARISON:  07/05/2017 FINDINGS: Stable mild to moderate cardiomegaly. Stable diffuse interstitial infiltrates, suspicious for mild interstitial edema. Tiny bilateral posterior pleural effusions. No evidence of pulmonary consolidation. IMPRESSION: No significant change in mild congestive heart failure, with tiny bilateral pleural effusions. Electronically Signed   By: Earle Gell M.D.   On: 07/27/2017 18:41   Ct Head Wo Contrast  Result Date: 07/27/2017 CLINICAL DATA:  Acute headache. EXAM: CT HEAD WITHOUT CONTRAST TECHNIQUE: Contiguous axial images were obtained from the base of the skull through the vertex without intravenous contrast. COMPARISON:  08/12/2013 FINDINGS: BRAIN: The ventricles and sulci are normal. No intraparenchymal hemorrhage, mass effect nor midline shift. No acute large vascular territory infarcts. Grey-white matter distinction is maintained. The basal ganglia are unremarkable. No abnormal extra-axial fluid collections. Basal cisterns are not effaced and midline. The brainstem and cerebellar hemispheres are without acute abnormalities. Partially empty pituitary sella. VASCULAR: Unremarkable. SKULL/SOFT TISSUES: No skull fracture. No significant soft tissue swelling. ORBITS/SINUSES: The included ocular globes and orbital contents are normal.The mastoid air cells are clear. The trace air-fluid level in the posterior left maxillary sinus with minimal mucosal thickening of the ethmoid and right maxillary sinuses. The sphenoid is clear. Under pneumatized frontal sinus. Intact orbits and globes. OTHER: None. IMPRESSION: 1. Minimal acute left maxillary sinusitis. 2. No acute intracranial abnormality. Electronically Signed   By: Ashley Royalty M.D.   On: 07/27/2017 20:11    Procedures Procedures (including critical care  time)  Medications Ordered in ED Medications  albuterol (PROVENTIL) (2.5 MG/3ML) 0.083% nebulizer solution 2.5 mg (not administered)  atorvastatin (LIPITOR) tablet 40 mg (40 mg Oral Given 07/27/17 2241)  DULoxetine (CYMBALTA) DR capsule 60 mg (not administered)  levothyroxine (SYNTHROID, LEVOTHROID) tablet 125 mcg (125 mcg Oral Given 07/28/17 0529)  metoprolol tartrate (LOPRESSOR) tablet 100 mg (100 mg Oral Given 07/27/17 2241)  pantoprazole (PROTONIX) EC tablet 40 mg (not administered)  zolpidem (AMBIEN) tablet 5 mg (5 mg Oral Given 07/27/17 2243)  sodium chloride flush (NS) 0.9 % injection 3 mL (3 mLs Intravenous Given 07/27/17 2254)  sodium chloride flush (NS) 0.9 % injection 3 mL (not administered)  0.9 %  sodium chloride infusion (not administered)  acetaminophen (TYLENOL) tablet 650 mg (650 mg Oral Given 07/27/17 2242)  ondansetron (ZOFRAN) injection 4 mg (not administered)  furosemide (LASIX) injection 20 mg (20 mg Intravenous Given 07/27/17 2039)  heparin ADULT infusion 100 units/mL (25000 units/241mL sodium chloride 0.45%) (1,050 Units/hr Intravenous New Bag/Given 07/27/17 2253)  aspirin chewable tablet 324 mg (324 mg Oral Given 07/27/17 2003)     Initial Impression / Assessment and Plan / ED Course  I have reviewed the triage vital signs and the nursing notes.  Pertinent labs & imaging results that were available during my care of the patient were reviewed by me and considered in my medical decision making (see chart for details).    61yo female with hx of CAD, atrial fibrillation, htn, hlpd, DM, fibromyalgia, presents from Dr. Darnelle Bos office of Cardiology given worsening dyspnea  on exertion with echo showing HF and chest pressure.  Patient in atrial fibrillation with rate control.  CXR with signs of volume overload. Initial troponin negative.  Symptoms more concerning for CHF, CAD than acute PE and pt has been on xarelto.  Reports headache, slow onset. CT head done given recent  fall one week ago without signs of intracranial bleed. Reports vague abdominal discomfort on exam, however lipase and CMP WNL, exam not consistent with acute cholecystitis, SBO or other pathology.  Suspect patient with heart failure, possible angina vs CHF symptoms. Consulted Cardiology for admission.    Final Clinical Impressions(s) / ED Diagnoses   Final diagnoses:  Acute congestive heart failure, unspecified heart failure type [facility])  Chest pressure    ED Discharge Orders    None       Gareth Morgan, MD 07/28/17 [phone]

## 2017-07-27 NOTE — ED Notes (Signed)
Pt denies any chest pain or pressures at this time

## 2017-07-28 ENCOUNTER — Other Ambulatory Visit: Payer: Self-pay

## 2017-07-28 ENCOUNTER — Inpatient Hospital Stay (HOSPITAL_COMMUNITY): Payer: Federal, State, Local not specified - PPO

## 2017-07-28 ENCOUNTER — Encounter: Payer: Self-pay | Admitting: Internal Medicine

## 2017-07-28 DIAGNOSIS — R079 Chest pain, unspecified: Secondary | ICD-10-CM

## 2017-07-28 DIAGNOSIS — I5021 Acute systolic (congestive) heart failure: Secondary | ICD-10-CM | POA: Diagnosis not present

## 2017-07-28 DIAGNOSIS — I481 Persistent atrial fibrillation: Secondary | ICD-10-CM | POA: Diagnosis not present

## 2017-07-28 LAB — BASIC METABOLIC PANEL
Anion gap: 8 (ref 5–15)
BUN: 12 mg/dL (ref 6–20)
CO2: 26 mmol/L (ref 22–32)
CREATININE: 0.85 mg/dL (ref 0.44–1.00)
Calcium: 9.5 mg/dL (ref 8.9–10.3)
Chloride: 105 mmol/L (ref 101–111)
GFR calc Af Amer: >60 mL/min (ref >60)
GLUCOSE: 103 mg/dL — AB (ref 65–99)
POTASSIUM: 3.5 mmol/L (ref 3.5–5.1)
SODIUM: 139 mmol/L (ref 135–145)

## 2017-07-28 LAB — CBC
HEMATOCRIT: 39.5 % (ref 36.0–46.0)
Hemoglobin: 12.4 g/dL (ref 12.0–15.0)
MCH: 28.8 pg (ref 26.0–34.0)
MCHC: 31.4 g/dL (ref 30.0–36.0)
MCV: 91.9 fL (ref 78.0–100.0)
PLATELETS: 277 10*3/uL (ref 150–400)
RBC: 4.3 MIL/uL (ref 3.87–5.11)
RDW: 14.1 % (ref 11.5–15.5)
WBC: 8.6 10*3/uL (ref 4.0–10.5)

## 2017-07-28 LAB — TROPONIN I
Troponin I: <0.03 ng/mL (ref <0.03)
Troponin I: <0.03 ng/mL (ref <0.03)

## 2017-07-28 LAB — HIV ANTIBODY (ROUTINE TESTING W REFLEX): HIV SCREEN 4TH GENERATION: NONREACTIVE

## 2017-07-28 LAB — NM MYOCAR MULTI W/SPECT W/WALL MOTION / EF
CSEPPHR: 148 {beats}/min
Rest HR: 100 {beats}/min

## 2017-07-28 LAB — APTT
APTT: 68 s — AB (ref 24–36)
APTT: 61 s — AB (ref 24–36)
aPTT: 70 seconds — ABNORMAL HIGH (ref 24–36)

## 2017-07-28 LAB — TSH: TSH: 4.801 u[IU]/mL — ABNORMAL HIGH (ref 0.350–4.500)

## 2017-07-28 MED ORDER — TECHNETIUM TC 99M TETROFOSMIN IV KIT
30.0000 | PACK | Freq: Once | INTRAVENOUS | Status: AC | PRN
  Administered 2017-07-28: 30 via INTRAVENOUS

## 2017-07-28 MED ORDER — METOPROLOL TARTRATE 50 MG PO TABS
50.0000 mg | ORAL_TABLET | Freq: Three times a day (TID) | ORAL | Status: DC
  Administered 2017-07-28 – 2017-07-29 (×3): 50 mg via ORAL
  Filled 2017-07-28 (×3): qty 1

## 2017-07-28 MED ORDER — METOPROLOL TARTRATE 100 MG PO TABS
100.0000 mg | ORAL_TABLET | Freq: Three times a day (TID) | ORAL | Status: DC
Start: 2017-07-28 — End: 2017-07-28

## 2017-07-28 MED ORDER — REGADENOSON 0.4 MG/5ML IV SOLN
0.4000 mg | Freq: Once | INTRAVENOUS | Status: AC
  Administered 2017-07-28: 0.4 mg via INTRAVENOUS

## 2017-07-28 MED ORDER — REGADENOSON 0.4 MG/5ML IV SOLN
INTRAVENOUS | Status: AC
  Filled 2017-07-28: qty 5

## 2017-07-28 MED ORDER — TECHNETIUM TC 99M TETROFOSMIN IV KIT
10.0000 | PACK | Freq: Once | INTRAVENOUS | Status: AC | PRN
  Administered 2017-07-28: 10 via INTRAVENOUS

## 2017-07-28 NOTE — Progress Notes (Signed)
ANTICOAGULATION CONSULT NOTE - Follow Up Consult  Pharmacy Consult for heparin Indication: atrial fibrillation  Allergies  Allergen Reactions  . Penicillins Swelling and Rash    Patient Measurements: Height: 5\' 4"  (162.6 cm) Weight: 210 lb 1.6 oz (95.3 kg)(scale A) IBW/kg (Calculated) : 54.7 Heparin Dosing Weight: 77 kg  Medications: Heparin @ 1200 units/hr   Assessment: 61yof continues on heparin while rivaroxaban on hold. APTT is therapeutic at 70 seconds. No bleeding reported.   Goal of Therapy:  APTT 66-102 seconds Heparin level 0.3-0.7 units/ml Monitor platelets by anticoagulation protocol: Yes   Plan:  1) Continue heparin at 1200 units/hr 2) Daily heparin level and APTT   Nena Jordan, [name], BCPS 07/28/2017 9:07 PM

## 2017-07-28 NOTE — Progress Notes (Signed)
   Melinda Mckinney presented for a nuclear stress test today.  No immediate complications.  Stress imaging is pending at this time.  Preliminary EKG findings may be listed in the chart, but the stress test result will not be finalized until perfusion imaging is complete.  Patient's HR was 95-110 upon arrival down to nuc med. Did rise to 130-140 after Lexiscan but returned to pre-test value around 105 after 7 mins post test.  Charlie Pitter, PA-C 07/28/2017, 2:41 PM

## 2017-07-28 NOTE — Progress Notes (Signed)
Patient has some home medications with her. She is aware that is not allowed to take any medication on her own. Stated she will send medications with her husband today.  Will continue to monitor.  Elijiah Mickley, RN

## 2017-07-28 NOTE — Progress Notes (Addendum)
Progress Note  Patient Name: Melinda Mckinney Date of Encounter: 07/28/2017  Primary Cardiologist: Oval Linsey   Subjective   No dyspnea, headache improved no palpitations   Inpatient Medications    Scheduled Meds: . atorvastatin  40 mg Oral QHS  . DULoxetine  60 mg Oral Daily  . furosemide  20 mg Intravenous Daily  . levothyroxine  125 mcg Oral QAC breakfast  . metoprolol tartrate  100 mg Oral BID  . pantoprazole  40 mg Oral Daily  . sodium chloride flush  3 mL Intravenous Q12H   Continuous Infusions: . sodium chloride    . heparin 1,050 Units/hr (07/27/17 2253)   PRN Meds: sodium chloride, acetaminophen, albuterol, ondansetron (ZOFRAN) IV, sodium chloride flush, zolpidem   Vital Signs    Vitals:   07/27/17 2045 07/27/17 2209 07/28/17 0012 07/28/17 0415  BP: 133/82 (!) 148/94 (!) 127/93 (!) 136/92  Pulse: 73 (!) 111 (!) 101 92  Resp: 18 18 20 20   Temp:  98.6 F (37 C) (!) 97.5 F (36.4 C) 97.8 F (36.6 C)  TempSrc:  Oral Oral Oral  SpO2: 97% 99% 98% 96%  Weight:  213 lb (96.6 kg)  210 lb 1.6 oz (95.3 kg)  Height:  5\' 4"  (1.626 m)      Intake/Output Summary (Last 24 hours) at 07/28/2017 0752 Last data filed at 07/28/2017 0515 Gross per 24 hour  Intake 186.86 ml  Output 600 ml  Net -413.14 ml   Filed Weights   07/27/17 1735 07/27/17 2209 07/28/17 0415  Weight: 214 lb (97.1 kg) 213 lb (96.6 kg) 210 lb 1.6 oz (95.3 kg)    Telemetry    Afib rates 90-110- Personally Reviewed  ECG    AFib no acute ST changes  - Personally Reviewed  Physical Exam  Overweight female  GEN: No acute distress.   Neck: No JVD Cardiac: RRR, no murmurs, rubs, or gallops.  Respiratory: Clear to auscultation bilaterally. GI: Soft, nontender, non-distended  MS: No edema; No deformity. Neuro:  Nonfocal  Psych: Normal affect   Labs    Chemistry Recent Labs  Lab 07/27/17 1757 07/28/17 0414  NA 140 139  K 3.7 3.5  CL 108 105  CO2 24 26  GLUCOSE 93 103*  BUN 12 12    CREATININE 0.75 0.85  CALCIUM 9.1 9.5  PROT 6.1*  --   ALBUMIN 3.4*  --   AST 21  --   ALT 20  --   ALKPHOS 87  --   BILITOT 0.8  --   GFRNONAA >60 >60  GFRAA >60 >60  ANIONGAP 8 8     Hematology Recent Labs  Lab 07/27/17 1757 07/28/17 0414  WBC 7.6 8.6  RBC 4.14 4.30  HGB 11.9* 12.4  HCT 38.1 39.5  MCV 92.0 91.9  MCH 28.7 28.8  MCHC 31.2 31.4  RDW 14.0 14.1  PLT 250 277    Cardiac Enzymes Recent Labs  Lab 07/27/17 2210 07/28/17 0414  TROPONINI <0.03 <0.03    Recent Labs  Lab 07/27/17 1818  TROPIPOC 0.01     BNP Recent Labs  Lab 07/27/17 1757  BNP 415.5*     DDimer No results for input(s): DDIMER in the last 168 hours.   Radiology    Dg Chest 2 View  Result Date: 07/27/2017 CLINICAL DATA:  Shortness of breath for 1 day. Coronary artery disease and hypertension. Atrial fibrillation. EXAM: CHEST  2 VIEW COMPARISON:  07/05/2017 FINDINGS: Stable mild to moderate cardiomegaly. Stable  diffuse interstitial infiltrates, suspicious for mild interstitial edema. Tiny bilateral posterior pleural effusions. No evidence of pulmonary consolidation. IMPRESSION: No significant change in mild congestive heart failure, with tiny bilateral pleural effusions. Electronically Signed   By: Earle Gell M.D.   On: 07/27/2017 18:41   Ct Head Wo Contrast  Result Date: 07/27/2017 CLINICAL DATA:  Acute headache. EXAM: CT HEAD WITHOUT CONTRAST TECHNIQUE: Contiguous axial images were obtained from the base of the skull through the vertex without intravenous contrast. COMPARISON:  08/12/2013 FINDINGS: BRAIN: The ventricles and sulci are normal. No intraparenchymal hemorrhage, mass effect nor midline shift. No acute large vascular territory infarcts. Grey-white matter distinction is maintained. The basal ganglia are unremarkable. No abnormal extra-axial fluid collections. Basal cisterns are not effaced and midline. The brainstem and cerebellar hemispheres are without acute abnormalities.  Partially empty pituitary sella. VASCULAR: Unremarkable. SKULL/SOFT TISSUES: No skull fracture. No significant soft tissue swelling. ORBITS/SINUSES: The included ocular globes and orbital contents are normal.The mastoid air cells are clear. The trace air-fluid level in the posterior left maxillary sinus with minimal mucosal thickening of the ethmoid and right maxillary sinuses. The sphenoid is clear. Under pneumatized frontal sinus. Intact orbits and globes. OTHER: None. IMPRESSION: 1. Minimal acute left maxillary sinusitis. 2. No acute intracranial abnormality. Electronically Signed   By: Ashley Royalty M.D.   On: 07/27/2017 20:11    Cardiac Studies   Echo EF 45%   Patient Profile     Melinda Mckinney is a 61 y.o. year-old female with history of coronary artery disease status post remote PCI in [place], persistent atrial fibrillation (recent onset), hypertension, hyperlipidemia, diabetes mellitus, and fibromyalgia, who presents as an acute visit for the DOD. Melinda Mckinney was first evaluated for new onset of atrial fibrillation by Dr. Oval Linsey in mid November. At that time, metoprolol was increased with reduction of HCTZ. She presented to the ED 5 days later with palpitations, vomiting, and headache. She underwent cardioversion in the ED with restoration of sinus rhythm. She subtotally followed up with Dr. Oval Linsey on 07/14/17, at which time she was found to be back in atrial fibrillation with a heart rate in the low 100s. She complained of fatigue at that time, prompting referral for echocardiogram. Metoprolol was continued and diltiazem added for improved heart rate control.    Assessment & Plan    CAD:  Remote PCI details not available done in [place]. R/O no acute ECG changes D/c heparin lexiscan myovue today Dyspnea:  Check BNP mild volume overload lasix today AFib:  Increase beta blocker consider adding amiodarone for AAT pending results of myovue since she has history of PCI Headache:  Resolved CT  head no bleed   For questions or updates, please contact [facility] Please consult [email or web address] for contact info under Cardiology/STEMI.      Signed, Jenkins Rouge, MD  07/28/2017, 7:52 AM

## 2017-07-28 NOTE — Progress Notes (Signed)
[facility] for heparin Indication: atrial fibrillation  Allergies  Allergen Reactions  . Penicillins Swelling and Rash    Patient Measurements: Height: 5\' 4"  (162.6 cm) Weight: 210 lb 1.6 oz (95.3 kg)(scale A) IBW/kg (Calculated) : 54.7 Heparin Dosing Weight: 77 kg  Assessment: CC/HPI: 61 yo f presenting with SOB  PMH: CAD, afib, HTN, HLD, DM  Anticoag: rivaroxaban pta for afib - unk last dose (BL HL > 2.2)  Hep 1050 units/hr with low aPTT at 61  Renal: SCr 0.85  Heme/Onc: H&H 12.4/39.5, Plt 277  Goal of Therapy:  Heparin level 0.3-0.7 units/ml Monitor platelets by anticoagulation protocol: Yes   Plan:  Increase heparin gtt to 1200 units/hr Next aPTT 2000 Daily HL aPTT CBC F/U conversion back to rivaroxaban  Levester Fresh, [name], BCPS, BCCCP Clinical Pharmacist Clinical phone for 07/28/2017 from 7a-3:30p: [phone] If after 3:30p, please call main pharmacy at: [phone] 07/28/2017 12:32 PM

## 2017-07-28 NOTE — Progress Notes (Signed)
Patient slept during the night, without complaints or concerns. Initially at admission complained of headache, Tylenol given and effective. Heparin infusing without problem.  Will continue to monitor.  Emylee Decelle, RN

## 2017-07-28 NOTE — Progress Notes (Addendum)
Awaiting nuc med result but reviewed prelim plan with Dr. Johnsie Cancel given continued borderline HR control. Initially plan was for titration of metoprolol to 100mg  TID however most recent HR 97 (still was fluctuating up to 1teens in nuc med). Will therefore change to 50mg  TID. Per Dr. Johnsie Cancel regardless of nuc result do not anticipate dc today.  Melinda Coxe PA-C  Addendum: nuc stress test result as follows:  There was no ST segment deviation noted during stress.  Nuclear stress EF: 28%. The left ventricular ejection fraction is severely decreased (<30%).  Defect 1: There is a small defect of mild severity present in the mid anterior, mid anteroseptal and apical septal location. This is a fixed defect and is likely due to breast or chest wall attenuation.  This is a high risk study based on reduction of LV function . There is no evidence of ischemia or infarction .  Continue HR control as above. EF was actually higher than this yesterday. Will have rounding team review for further plan in AM. Reviewed with patient via phone. Melinda Dembeck PA-C

## 2017-07-28 NOTE — Progress Notes (Signed)
ANTICOAGULATION CONSULT NOTE - Follow Up Consult  Pharmacy Consult for heparin Indication: atrial fibrillation  Labs: Recent Labs    07/27/17 1757 07/27/17 2210 07/28/17 0414  HGB 11.9*  --  12.4  HCT 38.1  --  39.5  PLT 250  --  277  APTT 37*  --  68*  HEPARINUNFRC >2.20*  --   --   CREATININE 0.75  --  0.85  TROPONINI  --  <0.03 <0.03    Assessment/Plan:  61yo female therapeutic on heparin with initial dosing while Xarelto on hold. Will continue gtt at current rate and confirm stable with additional PTT.   Wynona Neat, [name], BCPS  07/28/2017,6:00 AM

## 2017-07-28 NOTE — Progress Notes (Signed)
Stopped in and visited with patient and companion who is at bedside holding her hand.  Companion mentioned that they are Catholic but didn't mind me praying.  Had prayer with patient and made sure patient is on [place] list so that she may receive communion.     07/28/17 1015  Clinical Encounter Type  Visited With Patient and family together  Visit Type Spiritual support;Initial  Spiritual Encounters  Spiritual Needs Prayer

## 2017-07-28 NOTE — Progress Notes (Signed)
Paged MD regarding pt return from [place] stated okay to give cardiac diet order

## 2017-07-29 DIAGNOSIS — I481 Persistent atrial fibrillation: Secondary | ICD-10-CM | POA: Diagnosis not present

## 2017-07-29 DIAGNOSIS — I5021 Acute systolic (congestive) heart failure: Secondary | ICD-10-CM | POA: Diagnosis not present

## 2017-07-29 LAB — CBC
HCT: 38.3 % (ref 36.0–46.0)
HEMOGLOBIN: 12 g/dL (ref 12.0–15.0)
MCH: 28.9 pg (ref 26.0–34.0)
MCHC: 31.3 g/dL (ref 30.0–36.0)
MCV: 92.3 fL (ref 78.0–100.0)
Platelets: 241 10*3/uL (ref 150–400)
RBC: 4.15 MIL/uL (ref 3.87–5.11)
RDW: 14 % (ref 11.5–15.5)
WBC: 8.1 10*3/uL (ref 4.0–10.5)

## 2017-07-29 LAB — APTT: aPTT: 76 seconds — ABNORMAL HIGH (ref 24–36)

## 2017-07-29 LAB — BASIC METABOLIC PANEL
ANION GAP: 12 (ref 5–15)
BUN: 12 mg/dL (ref 6–20)
CALCIUM: 9.1 mg/dL (ref 8.9–10.3)
CO2: 24 mmol/L (ref 22–32)
Chloride: 104 mmol/L (ref 101–111)
Creatinine, Ser: 0.87 mg/dL (ref 0.44–1.00)
GFR calc Af Amer: >60 mL/min (ref >60)
GFR calc non Af Amer: >60 mL/min (ref >60)
GLUCOSE: 111 mg/dL — AB (ref 65–99)
Potassium: 3.8 mmol/L (ref 3.5–5.1)
Sodium: 140 mmol/L (ref 135–145)

## 2017-07-29 LAB — HEPARIN LEVEL (UNFRACTIONATED): HEPARIN UNFRACTIONATED: 1.01 [IU]/mL — AB (ref 0.30–0.70)

## 2017-07-29 MED ORDER — METOPROLOL SUCCINATE ER 100 MG PO TB24: ORAL_TABLET | ORAL | 3 refills | Status: DC

## 2017-07-29 MED ORDER — FUROSEMIDE 20 MG PO TABS: 20.0000 mg | ORAL_TABLET | Freq: Every day | ORAL | 3 refills | Status: DC

## 2017-07-29 NOTE — Progress Notes (Signed)
Pt discahrged via wheelchair with nurse staff

## 2017-07-29 NOTE — Progress Notes (Signed)
Progress Note  Patient Name: Melinda Mckinney Date of Encounter: 07/29/2017  Primary Cardiologist: Oval Linsey   Subjective   SOB improved. No palpitations   Inpatient Medications    Scheduled Meds: . atorvastatin  40 mg Oral QHS  . DULoxetine  60 mg Oral Daily  . furosemide  20 mg Intravenous Daily  . levothyroxine  125 mcg Oral QAC breakfast  . metoprolol tartrate  50 mg Oral TID  . pantoprazole  40 mg Oral Daily  . sodium chloride flush  3 mL Intravenous Q12H   Continuous Infusions: . sodium chloride    . heparin 1,200 Units/hr (07/28/17 1835)   PRN Meds: sodium chloride, acetaminophen, albuterol, ondansetron (ZOFRAN) IV, sodium chloride flush, zolpidem   Vital Signs    Vitals:   07/28/17 1434 07/28/17 1959 07/29/17 0526 07/29/17 0850  BP: 127/88 122/84 128/80 125/78  Pulse:  89 87 76  Resp:  18 18 17   Temp:  98.3 F (36.8 C) 98.1 F (36.7 C)   TempSrc:  Oral Oral   SpO2:  95% 97%   Weight:   209 lb 1.6 oz (94.8 kg)   Height:        Intake/Output Summary (Last 24 hours) at 07/29/2017 0942 Last data filed at 07/29/2017 0300 Gross per 24 hour  Intake 612.98 ml  Output 1100 ml  Net -487.02 ml   Filed Weights   07/27/17 2209 07/28/17 0415 07/29/17 0526  Weight: 213 lb (96.6 kg) 210 lb 1.6 oz (95.3 kg) 209 lb 1.6 oz (94.8 kg)    Telemetry    Atrial fibrillation- Personally Reviewed  ECG    Atrial fibrillation  - Personally Reviewed  Physical Exam   GEN: Well nourished, well developed, in no acute distress  HEENT: normal  Neck: no JVD, carotid bruits, or masses Cardiac: iRRR; no murmurs, rubs, or gallops,no edema  Respiratory:  clear to auscultation bilaterally, normal work of breathing GI: soft, nontender, nondistended, + BS MS: no deformity or atrophy  Skin: warm and dry Neuro:  Strength and sensation are intact Psych: euthymic mood, full affect   Labs    Chemistry Recent Labs  Lab 07/27/17 1757 07/28/17 0414 07/29/17 0427  NA 140 139  140  K 3.7 3.5 3.8  CL 108 105 104  CO2 24 26 24   GLUCOSE 93 103* 111*  BUN 12 12 12   CREATININE 0.75 0.85 0.87  CALCIUM 9.1 9.5 9.1  PROT 6.1*  --   --   ALBUMIN 3.4*  --   --   AST 21  --   --   ALT 20  --   --   ALKPHOS 87  --   --   BILITOT 0.8  --   --   GFRNONAA >60 >60 >60  GFRAA >60 >60 >60  ANIONGAP 8 8 12      Hematology Recent Labs  Lab 07/27/17 1757 07/28/17 0414 07/29/17 0427  WBC 7.6 8.6 8.1  RBC 4.14 4.30 4.15  HGB 11.9* 12.4 12.0  HCT 38.1 39.5 38.3  MCV 92.0 91.9 92.3  MCH 28.7 28.8 28.9  MCHC 31.2 31.4 31.3  RDW 14.0 14.1 14.0  PLT 250 277 241    Cardiac Enzymes Recent Labs  Lab 07/27/17 2210 07/28/17 0414 07/28/17 1038  TROPONINI <0.03 <0.03 <0.03    Recent Labs  Lab 07/27/17 1818  TROPIPOC 0.01     BNP Recent Labs  Lab 07/27/17 1757  BNP 415.5*     DDimer No results for input(s):  DDIMER in the last 168 hours.   Radiology    Dg Chest 2 View  Result Date: 07/27/2017 CLINICAL DATA:  Shortness of breath for 1 day. Coronary artery disease and hypertension. Atrial fibrillation. EXAM: CHEST  2 VIEW COMPARISON:  07/05/2017 FINDINGS: Stable mild to moderate cardiomegaly. Stable diffuse interstitial infiltrates, suspicious for mild interstitial edema. Tiny bilateral posterior pleural effusions. No evidence of pulmonary consolidation. IMPRESSION: No significant change in mild congestive heart failure, with tiny bilateral pleural effusions. Electronically Signed   By: Earle Gell M.D.   On: 07/27/2017 18:41   Ct Head Wo Contrast  Result Date: 07/27/2017 CLINICAL DATA:  Acute headache. EXAM: CT HEAD WITHOUT CONTRAST TECHNIQUE: Contiguous axial images were obtained from the base of the skull through the vertex without intravenous contrast. COMPARISON:  08/12/2013 FINDINGS: BRAIN: The ventricles and sulci are normal. No intraparenchymal hemorrhage, mass effect nor midline shift. No acute large vascular territory infarcts. Grey-white matter  distinction is maintained. The basal ganglia are unremarkable. No abnormal extra-axial fluid collections. Basal cisterns are not effaced and midline. The brainstem and cerebellar hemispheres are without acute abnormalities. Partially empty pituitary sella. VASCULAR: Unremarkable. SKULL/SOFT TISSUES: No skull fracture. No significant soft tissue swelling. ORBITS/SINUSES: The included ocular globes and orbital contents are normal.The mastoid air cells are clear. The trace air-fluid level in the posterior left maxillary sinus with minimal mucosal thickening of the ethmoid and right maxillary sinuses. The sphenoid is clear. Under pneumatized frontal sinus. Intact orbits and globes. OTHER: None. IMPRESSION: 1. Minimal acute left maxillary sinusitis. 2. No acute intracranial abnormality. Electronically Signed   By: Ashley Royalty M.D.   On: 07/27/2017 20:11   Nm Myocar Multi W/spect W/wall Motion / Ef  Result Date: 07/28/2017  There was no ST segment deviation noted during stress.  Nuclear stress EF: 28%. The left ventricular ejection fraction is severely decreased (<30%).  Defect 1: There is a small defect of mild severity present in the mid anterior, mid anteroseptal and apical septal location. This is a fixed defect and is likely due to breast or chest wall attenuation.  This is a high risk study based on reduction of LV function . There is no evidence of ischemia or infarction .     Cardiac Studies   Echo EF 45%   Patient Profile     Melinda Mckinney is a 61 y.o. year-old female with history of coronary artery disease status post remote PCI in [place], persistent atrial fibrillation (recent onset), hypertension, hyperlipidemia, diabetes mellitus, and fibromyalgia, who presents as an acute visit for the DOD. Melinda Mckinney was first evaluated for new onset of atrial fibrillation by Dr. Oval Linsey in mid November. At that time, metoprolol was increased with reduction of HCTZ. She presented to the ED 5 days later with  palpitations, vomiting, and headache. She underwent cardioversion in the ED with restoration of sinus rhythm. She subtotally followed up with Dr. Oval Linsey on 07/14/17, at which time she was found to be back in atrial fibrillation with a heart rate in the low 100s. She complained of fatigue at that time, prompting referral for echocardiogram. Metoprolol was continued and diltiazem added for improved heart rate control.    Assessment & Plan    CAD: Acute changes based on most recent Otho.  No current medication changes. Dyspnea: Feeling much improved with Lasix.  No changes. AFib: Plan for discharge today.  Nathali Vent increase metoprolol to 100 mg twice daily for improved rate control.  We Tylynn Braniff plan to discharge  her on home Xarelto.  We Tora Prunty also start her on amiodarone as an outpatient. Mildly reduced systolic heart failure: Echo with an EF of 45-50%, though Myoview shows a severely reduced ejection fraction.  I tend to believe the echo results and based on her symptoms feel that her EF is only mildly reduced.  No changes at this time.   For questions or updates, please contact [place] Please consult [email or web address] for contact info under Cardiology/STEMI.      Signed, Heidie Krall Meredith Leeds, MD  07/29/2017, 9:42 AM

## 2017-07-29 NOTE — Progress Notes (Signed)
ANTICOAGULATION CONSULT NOTE - Follow Up Consult  Pharmacy Consult for heparin Indication: atrial fibrillation  Allergies  Allergen Reactions  . Penicillins Swelling and Rash    Patient Measurements: Height: 5\' 4"  (162.6 cm) Weight: 209 lb 1.6 oz (94.8 kg) IBW/kg (Calculated) : 54.7 Heparin Dosing Weight: 77 kg  Medications: Heparin @ 1200 units/hr   Assessment: 61yof continues on heparin for r/o MI while rivaroxaban on hold. APTT is therapeutic at 76 seconds. Plans noted for discharge today and to restart xarelto.    Goal of Therapy:  APTT 66-102 seconds Heparin level 0.3-0.7 units/ml Monitor platelets by anticoagulation protocol: Yes   Plan:  1) Continue heparin at 1200 units/hr for now 2) Anticipate discharge  Hildred Laser, Pharm D 07/29/2017 10:13 AM

## 2017-07-29 NOTE — Progress Notes (Signed)
Pt IV discontinued, catheter intact and telemetry removed. Pt discharge education printed in [place] and [place]. Education went over at bedside with pt and pt family. Pt has all belongings.

## 2017-07-29 NOTE — Discharge Summary (Signed)
Discharge Summary    Patient ID: Melinda Mckinney,  MRN: [id number], DOB/AGE: [birth year] 61 y.o.  Admit date: 07/27/2017 Discharge date: 07/29/2017  Primary Care Provider: [facility] Primary Cardiologist: Dr. Oval Linsey  Discharge Diagnoses    Active Problems:   Acute congestive heart failure [facility])   Precordial pain   CAD    Dyspnea  Atrial fibrillation  Allergies Allergies  Allergen Reactions  . Penicillins Swelling and Rash    Diagnostic Studies/Procedures   Echo 07/27/17 Study Conclusions  - Left ventricle: The cavity size was normal. Wall thickness was   normal. Systolic function was mildly reduced. The estimated   ejection fraction was in the range of 45% to 50%. - Aortic valve: There was mild regurgitation. - Mitral valve: There was moderate regurgitation. - Right atrium: The atrium was mildly dilated.   Myoview 07/28/17   There was no ST segment deviation noted during stress.  Nuclear stress EF: 28%. The left ventricular ejection fraction is severely decreased (<30%).  Defect 1: There is a small defect of mild severity present in the mid anterior, mid anteroseptal and apical septal location. This is a fixed defect and is likely due to breast or chest wall attenuation.  This is a high risk study based on reduction of LV function . There is no evidence of ischemia or infarction .   History of Present Illness     MelindaCruzis a 61 y.o.year-old femalewith history of coronary artery disease status post remote PCI in [place], persistent atrial fibrillation (recent onset), hypertension, hyperlipidemia, diabetes mellitus, and fibromyalgia transferred to ER from clinic for SOB.   Melinda Mckinney was first evaluated for new onset of atrial fibrillation by Dr. Oval Linsey in mid November. At that time, metoprolol was increased with reduction of HCTZ. She presented to the ED 5 days later with palpitations, vomiting, and headache. She underwent cardioversion in the ED with  restoration of sinus rhythm. She subtotally followed up with Dr. Oval Linsey on 07/14/17, at which time she was found to be back in atrial fibrillation with a heart rate in the low 100s. She complained of fatigue at that time, prompting referral for echocardiogram. Metoprolol was continued and diltiazem added for improved heart rate control.  [name] presented for transthoracic echocardiogram 07/27/17. She was noted to be very short of breath by the sonographer first, who alerted [place]. Ms. Chilcott notes that over the last 4 days, she has had significant shortness of breath with exertion and now even at rest. She has chronic orthopnea, which has increased to 3-4 pillows over the last few days. She also endorses PND and abdominal distention. She has not had any significant leg edema. She does not describe frank chest pain but feels as though there is some faint tightness in the center of her chest, as if she cannot take a deep breath. She notes intermittent palpitations as well. She has been compliant with her medications, including diltiazem and rivaroxaban.   Hospital Course     Consultants: None   BNP of 415.5. Echo with an EF of 45-50. Started on lasix. Net I & O negative 620cc. Weight down 5 lb (214-->209lb). Dyspnea resolved with diuresis. Stress test is a high risk study based on reduction of LV function . There is no evidence of ischemia or infarction. Fluctuating heart rate during admission. Wil discharge on Toprol XL 100BID per Dr. Curt Bears. Xarelto for anticoagulation. Has appointment with Dr. Rayann Heman next week for ablatio discussion. Consider amiodarone as an  outpatient.  The patient has been seen by Dr. Curt Bears today and deemed ready for discharge home. All follow-up appointments have been scheduled. Discharge medications are listed below.    Discharge Vitals Blood pressure (!) 157/88, pulse 89, temperature 98.4 F (36.9 C), temperature source Oral, resp. rate 18, height 5\' 4"  (1.626 m), weight  209 lb 1.6 oz (94.8 kg), SpO2 98 %.  Filed Weights   07/27/17 2209 07/28/17 0415 07/29/17 0526  Weight: 213 lb (96.6 kg) 210 lb 1.6 oz (95.3 kg) 209 lb 1.6 oz (94.8 kg)    Labs & Radiologic Studies     CBC Recent Labs    07/28/17 0414 07/29/17 0427  WBC 8.6 8.1  HGB 12.4 12.0  HCT 39.5 38.3  MCV 91.9 92.3  PLT 277 329   Basic Metabolic Panel Recent Labs    07/28/17 0414 07/29/17 0427  NA 139 140  K 3.5 3.8  CL 105 104  CO2 26 24  GLUCOSE 103* 111*  BUN 12 12  CREATININE 0.85 0.87  CALCIUM 9.5 9.1   Liver Function Tests Recent Labs    07/27/17 1757  AST 21  ALT 20  ALKPHOS 87  BILITOT 0.8  PROT 6.1*  ALBUMIN 3.4*   Recent Labs    07/27/17 1757  LIPASE 19   Cardiac Enzymes Recent Labs    07/27/17 2210 07/28/17 0414 07/28/17 1038  TROPONINI <0.03 <0.03 <0.03   Thyroid Function Tests Recent Labs    07/28/17 0414  TSH 4.801*    Dg Chest 2 View  Result Date: 07/27/2017 CLINICAL DATA:  Shortness of breath for 1 day. Coronary artery disease and hypertension. Atrial fibrillation. EXAM: CHEST  2 VIEW COMPARISON:  07/05/2017 FINDINGS: Stable mild to moderate cardiomegaly. Stable diffuse interstitial infiltrates, suspicious for mild interstitial edema. Tiny bilateral posterior pleural effusions. No evidence of pulmonary consolidation. IMPRESSION: No significant change in mild congestive heart failure, with tiny bilateral pleural effusions. Electronically Signed   By: Earle Gell M.D.   On: 07/27/2017 18:41   Ct Head Wo Contrast  Result Date: 07/27/2017 CLINICAL DATA:  Acute headache. EXAM: CT HEAD WITHOUT CONTRAST TECHNIQUE: Contiguous axial images were obtained from the base of the skull through the vertex without intravenous contrast. COMPARISON:  08/12/2013 FINDINGS: BRAIN: The ventricles and sulci are normal. No intraparenchymal hemorrhage, mass effect nor midline shift. No acute large vascular territory infarcts. Grey-white matter distinction is  maintained. The basal ganglia are unremarkable. No abnormal extra-axial fluid collections. Basal cisterns are not effaced and midline. The brainstem and cerebellar hemispheres are without acute abnormalities. Partially empty pituitary sella. VASCULAR: Unremarkable. SKULL/SOFT TISSUES: No skull fracture. No significant soft tissue swelling. ORBITS/SINUSES: The included ocular globes and orbital contents are normal.The mastoid air cells are clear. The trace air-fluid level in the posterior left maxillary sinus with minimal mucosal thickening of the ethmoid and right maxillary sinuses. The sphenoid is clear. Under pneumatized frontal sinus. Intact orbits and globes. OTHER: None. IMPRESSION: 1. Minimal acute left maxillary sinusitis. 2. No acute intracranial abnormality. Electronically Signed   By: Ashley Royalty M.D.   On: 07/27/2017 20:11   Nm Myocar Multi W/spect W/wall Motion / Ef  Result Date: 07/28/2017  There was no ST segment deviation noted during stress.  Nuclear stress EF: 28%. The left ventricular ejection fraction is severely decreased (<30%).  Defect 1: There is a small defect of mild severity present in the mid anterior, mid anteroseptal and apical septal location. This is a fixed defect and  is likely due to breast or chest wall attenuation.  This is a high risk study based on reduction of LV function . There is no evidence of ischemia or infarction .    Dg Chest Port 1 View  Result Date: 07/05/2017 CLINICAL DATA:  Shortness of breath. EXAM: PORTABLE CHEST 1 VIEW COMPARISON:  08/18/2011 FINDINGS: 0828 hours. The cardio pericardial silhouette is enlarged. Diffuse interstitial opacity suggests edema. Probable basilar atelectasis bilaterally. The visualized bony structures of the thorax are intact. Telemetry leads overlie the chest. IMPRESSION: Cardiomegaly with probable interstitial edema. Electronically Signed   By: Misty Stanley M.D.   On: 07/05/2017 09:22    Disposition   Pt is being  discharged home today in good condition.  Follow-up Plans & Appointments    Follow-up Information    Thompson Grayer, MD. Go on 08/02/2017.   Specialty:  Cardiology Why:  @8 :45 for atrial fibrilaltion evaluation  Contact information: [address] [place] [address] [phone]          Discharge Instructions    Diet - low sodium heart healthy   Complete by:  As directed    Increase activity slowly   Complete by:  As directed       Discharge Medications   Allergies as of 07/29/2017      Reactions   Penicillins Swelling, Rash      Medication List    STOP taking these medications   diltiazem 120 MG 24 hr capsule Commonly known as:  DILACOR XR   hydrochlorothiazide 50 MG tablet Commonly known as:  HYDRODIURIL   metoprolol tartrate 100 MG tablet Commonly known as:  LOPRESSOR     TAKE these medications   albuterol 108 (90 Base) MCG/ACT inhaler Commonly known as:  VENTOLIN HFA Inhale 2 puffs into the lungs every 6 (six) hours as needed for wheezing or shortness of breath.   allopurinol 100 MG tablet Commonly known as:  ZYLOPRIM Take 1 tablet (100 mg total) by mouth daily.   atorvastatin 40 MG tablet Commonly known as:  LIPITOR Take 1 tablet (40 mg total) by mouth at bedtime.   clobetasol cream 0.05 % Commonly known as:  TEMOVATE Apply 1 application topically 2 (two) times daily. Apply twice a week first week then once weekly as needed   cyclobenzaprine 10 MG tablet Commonly known as:  FLEXERIL Take 1 tablet (10 mg total) by mouth 2 (two) times daily as needed for muscle spasms.   DULoxetine 60 MG capsule Commonly known as:  CYMBALTA Take 1 capsule (60 mg total) daily by mouth.   furosemide 20 MG tablet Commonly known as:  LASIX Take 1 tablet (20 mg total) by mouth daily.   levothyroxine 125 MCG tablet Commonly known as:  SYNTHROID, LEVOTHROID Take 1 tablet (125 mcg total) by mouth daily before breakfast.   metFORMIN 500 MG  tablet Commonly known as:  GLUCOPHAGE Take 1 tablet (500 mg total) by mouth 2 (two) times daily with a meal.   metoprolol succinate 100 MG 24 hr tablet Commonly known as:  TOPROL XL Take 1 table by mouth twice a day. Take with or immediately following a meal.   omeprazole 20 MG capsule Commonly known as:  PRILOSEC Take 1 capsule (20 mg total) by mouth daily.   raloxifene 60 MG tablet Commonly known as:  EVISTA Take 1 tablet (60 mg total) by mouth daily.   rivaroxaban 20 MG Tabs tablet Commonly known as:  XARELTO Take 1 tablet (20  mg total) daily with supper by mouth.   Vitamin D3 5000 units Caps Take 1 capsule by mouth.   zolpidem 5 MG tablet Commonly known as:  AMBIEN Take 1 tablet (5 mg total) by mouth at bedtime as needed for sleep.           Outstanding Labs/Studies   None  Duration of Discharge Encounter   Greater than 30 minutes including physician time.  Signed, Bhavinkumar Bhagat PA-C 07/29/2017, 10:57 AM  I have seen and examined this patient with Vin Bhagat.  Agree with above, note added to reflect my findings.  On exam, iRRR, no murmurs, lungs clear.  She presented to the hospital in acute heart failure.  Has been diuresed and is feeling much better with improved breathing.  Echo showed an EF of 45-50%.  Myoview showed an EF which was significantly decreased with an apical scar.  She has had stenting in the past.  It is likely that her ejection fraction is more in the range of 45-50% based on her echo.  We Brianda Beitler plan for discharge today on twice daily Toprol-XL 100 mg.  She has follow-up in EP clinic on Wednesday.  Kaleigha Chamberlin M. Madlynn Lundeen MD 07/29/2017 11:14 AM

## 2017-07-31 ENCOUNTER — Telehealth: Payer: Self-pay

## 2017-07-31 NOTE — Telephone Encounter (Signed)
Called patient left message for return call to schedule [facility] Follow Up.

## 2017-08-02 ENCOUNTER — Encounter: Payer: Self-pay | Admitting: Internal Medicine

## 2017-08-02 ENCOUNTER — Ambulatory Visit (INDEPENDENT_AMBULATORY_CARE_PROVIDER_SITE_OTHER): Payer: Federal, State, Local not specified - PPO | Admitting: Internal Medicine

## 2017-08-02 VITALS — BP 92/80 | HR 94 | Ht 63.0 in | Wt 205.0 lb

## 2017-08-02 DIAGNOSIS — I10 Essential (primary) hypertension: Secondary | ICD-10-CM | POA: Diagnosis not present

## 2017-08-02 DIAGNOSIS — I25119 Atherosclerotic heart disease of native coronary artery with unspecified angina pectoris: Secondary | ICD-10-CM

## 2017-08-02 DIAGNOSIS — I4819 Other persistent atrial fibrillation: Secondary | ICD-10-CM

## 2017-08-02 DIAGNOSIS — I481 Persistent atrial fibrillation: Secondary | ICD-10-CM | POA: Diagnosis not present

## 2017-08-02 NOTE — Patient Instructions (Signed)
Medication Instructions:  Your physician recommends that you continue on your current medications as directed. Please refer to the Current Medication list given to you today.   Labwork: None ordered   Testing/Procedures: None ordered   Follow-Up: Will admit tomorrow 08/03/17 for Sotalol load Hospital will call when bed is ready  Any Other Special Instructions Will Be Listed Below (If Applicable).     If you need a refill on your cardiac medications before your next appointment, please call your pharmacy.

## 2017-08-02 NOTE — Telephone Encounter (Signed)
Patient had follow up with Cardiologist today. Has pending hospital admission for tomorrow.

## 2017-08-02 NOTE — Progress Notes (Signed)
Electrophysiology Office Note   Date:  08/02/2017   ID:  Melinda Mckinney, DOB 1956/08/01, MRN [id number]  PCP:  Colon Branch, MD  Cardiologist:  Dr Oval Linsey Primary Electrophysiologist: Thompson Grayer, MD    Chief Complaint  Patient presents with  . Atrial Fibrillation     History of Present Illness: Melinda Mckinney is a 61 y.o. female who presents today for electrophysiology evaluation.   The patient is referred by Dr Oval Linsey for EP consultation regarding atrial fibrillation management.  She is interviewed with translator assistance today.  She was diagnosed with atrial fibrillation 06/2017.  She was started on xarelto and metoprolol. She has symptoms of fatigue and palpitations.  She also has symptoms of CHF for which she was admitted 07/27/17.  Today, she denies symptoms of chest pain,  orthopnea, PND, lower extremity edema, claudication, dizziness, presyncope, syncope, bleeding, or neurologic sequela. The patient is tolerating medications without difficulties and is otherwise without complaint today.    Past Medical History:  Diagnosis Date  . Abnormal Pap smear of cervix   . Anemia   . Anxiety and depression   . Asthma   . Borderline diabetes   . CAD (coronary artery disease) ~2009   stent   . Cervical cancer ([place])    "stage 1; had cryotherapy"  . Cervical dysplasia   . Chronic lower back pain   . DDD (degenerative disc disease), lumbar    Dr. Nelva Bush, recommends lumbar epidural steroid injections L5-S1 to the right  . Depression   . Fibromyalgia   . GERD (gastroesophageal reflux disease)   . Headache    "only when I'm short of breath"  (07/27/2017)  . High cholesterol   . History of hiatal hernia   . History of kidney stones   . Hypertension   . Hypothyroidism   . Insomnia 09/27/2013  . Lichen sclerosus    Vulvar area  . Osteoporosis   . Thyroid disease   . Vitamin D deficiency    Past Surgical History:  Procedure Laterality Date  . CARDIAC CATHETERIZATION  ~  2008  . [place]; ~ 1978/1979; 1984  . ESOPHAGOMYOTOMY  2009  . GYNECOLOGIC CRYOSURGERY  X 2   "stage 1 cancer"  . HERNIA REPAIR  X 6   "all in my stomach" (07/27/2017)  . KNEE ARTHROSCOPY Right   . NEPHRECTOMY Right 1991   in [place], due to fibrosis and lithiasis  . TUBAL LIGATION       Current Outpatient Medications  Medication Sig Dispense Refill  . albuterol (VENTOLIN HFA) 108 (90 Base) MCG/ACT inhaler Inhale 2 puffs into the lungs every 6 (six) hours as needed for wheezing or shortness of breath. 1 Inhaler 1  . allopurinol (ZYLOPRIM) 100 MG tablet Take 1 tablet (100 mg total) by mouth daily. 90 tablet 2  . atorvastatin (LIPITOR) 40 MG tablet Take 1 tablet (40 mg total) by mouth at bedtime. 90 tablet 2  . Cholecalciferol (VITAMIN D3) 5000 UNITS CAPS Take 1 capsule by mouth daily.     . clobetasol cream (TEMOVATE) 9.52 % Apply 1 application topically 2 (two) times daily. Apply twice a week first week then once weekly as needed 30 g 1  . cyclobenzaprine (FLEXERIL) 10 MG tablet Take 1 tablet (10 mg total) by mouth 2 (two) times daily as needed for muscle spasms. 40 tablet 2  . DULoxetine (CYMBALTA) 60 MG capsule Take 1 capsule (60 mg total) daily by mouth. 90 capsule 1  .  furosemide (LASIX) 20 MG tablet Take 1 tablet (20 mg total) by mouth daily. 30 tablet 3  . levothyroxine (SYNTHROID, LEVOTHROID) 125 MCG tablet Take 1 tablet (125 mcg total) by mouth daily before breakfast. 90 tablet 0  . metFORMIN (GLUCOPHAGE) 500 MG tablet Take 1 tablet (500 mg total) by mouth 2 (two) times daily with a meal. 180 tablet 2  . metoprolol succinate (TOPROL XL) 100 MG 24 hr tablet Take 1 table by mouth twice a day. Take with or immediately following a meal. 60 tablet 3  . omeprazole (PRILOSEC) 20 MG capsule Take 1 capsule (20 mg total) by mouth daily. 90 capsule 2  . raloxifene (EVISTA) 60 MG tablet Take 1 tablet (60 mg total) by mouth daily. 30 tablet 11  . rivaroxaban (XARELTO) 20 MG  TABS tablet Take 1 tablet (20 mg total) daily with supper by mouth. 30 tablet 1  . zolpidem (AMBIEN) 5 MG tablet Take 1 tablet (5 mg total) by mouth at bedtime as needed for sleep. 90 tablet 2   No current facility-administered medications for this visit.     Allergies:   Penicillins   Social History:  The patient  reports that she quit smoking about 18 years ago. Her smoking use included cigarettes. She has a 56.00 pack-year smoking history. she has never used smokeless tobacco. She reports that she drinks alcohol. She reports that she does not use drugs.   Family History:  The patient's  family history includes Breast cancer in her other; CAD in her brother and father; Diabetes in her father; Lung cancer in her father and mother; Stroke in her sister.    ROS:  Please see the history of present illness.   All other systems are personally reviewed and negative.    PHYSICAL EXAM: VS:  BP 92/80   Pulse 94   Ht 5\' 3"  (1.6 m)   Wt 205 lb (93 kg)   SpO2 97%   BMI 36.31 kg/m  , BMI Body mass index is 36.31 kg/m. GEN: Well nourished, well developed, in no acute distress  HEENT: normal  Neck: no JVD, carotid bruits, or masses Cardiac: iRRR; no murmurs, rubs, or gallops,no edema  Respiratory:  clear to auscultation bilaterally, normal work of breathing GI: soft, nontender, nondistended, + BS MS: no deformity or atrophy  Skin: warm and dry  Neuro:  Strength and sensation are intact Psych: euthymic mood, full affect  EKG:  EKG is ordered today. The ekg ordered today is personally reviewed and shows afib, Qtc 420 msec   Recent Labs: 07/27/2017: ALT 20; B Natriuretic Peptide 415.5 07/28/2017: TSH 4.801 07/29/2017: BUN 12; Creatinine, Ser 0.87; Hemoglobin 12.0; Platelets 241; Potassium 3.8; Sodium 140  personally reviewed   Lipid Panel     Component Value Date/Time   CHOL 176 01/06/2017 1035   TRIG 100.0 01/06/2017 1035   HDL 81.50 01/06/2017 1035   CHOLHDL 2 01/06/2017 1035    VLDL 20.0 01/06/2017 1035   LDLCALC 74 01/06/2017 1035   LDLDIRECT 76.0 02/06/2015 0916   personally reviewed   Wt Readings from Last 3 Encounters:  08/02/17 205 lb (93 kg)  07/29/17 209 lb 1.6 oz (94.8 kg)  07/27/17 214 lb 4 oz (97.2 kg)      Other studies personally reviewed: Additional studies/ records that were reviewed today include: records from recent admission, Dr Quintella Reichert notes, prior ekgs , prior echo Review of the above records today demonstrates: as above   ASSESSMENT AND PLAN:  1.  Persistent atrial fibrillation The patient has symptomatic persistent atrial fibrillation.  She has not tried prior AAD therapy. Therapeutic strategies for afib including sotalol, tikosyn, and amiodarone were discussed in detail with the patient today. Risk, benefits, and alternatives to each of these was discussed in detail.  She would prefer to try sotalol.  She does not feel that she can afford tikosyn.  We will therefore plan to admit tomorrow for initiation of sotalol. She reports compliance with anticoagulation without interruption.    2. CAD No ischemic symptoms No changes  3. HTN Stable No change required today   Follow-up:  AF clinic 1 week after initiation of sotalol  Current medicines are reviewed at length with the patient today.   The patient does not have concerns regarding her medicines.  The following changes were made today:  none   Signed, Thompson Grayer, MD   [place] [address] [phone] (office) [phone] (fax)

## 2017-08-03 ENCOUNTER — Inpatient Hospital Stay (HOSPITAL_COMMUNITY)
Admission: AD | Admit: 2017-08-03 | Discharge: 2017-08-06 | DRG: 310 | Disposition: A | Discharge status: Home / Self Care | Payer: Federal, State, Local not specified - PPO | Source: Ambulatory Visit | Attending: Internal Medicine | Admitting: Internal Medicine

## 2017-08-03 ENCOUNTER — Encounter (HOSPITAL_COMMUNITY): Payer: Self-pay | Admitting: General Practice

## 2017-08-03 ENCOUNTER — Other Ambulatory Visit: Payer: Self-pay

## 2017-08-03 DIAGNOSIS — Z880 Allergy status to penicillin: Secondary | ICD-10-CM | POA: Diagnosis not present

## 2017-08-03 DIAGNOSIS — Z5181 Encounter for therapeutic drug level monitoring: Secondary | ICD-10-CM | POA: Diagnosis not present

## 2017-08-03 DIAGNOSIS — I509 Heart failure, unspecified: Secondary | ICD-10-CM | POA: Diagnosis present

## 2017-08-03 DIAGNOSIS — Z905 Acquired absence of kidney: Secondary | ICD-10-CM | POA: Diagnosis not present

## 2017-08-03 DIAGNOSIS — G4700 Insomnia, unspecified: Secondary | ICD-10-CM | POA: Diagnosis present

## 2017-08-03 DIAGNOSIS — R51 Headache: Secondary | ICD-10-CM | POA: Diagnosis not present

## 2017-08-03 DIAGNOSIS — I481 Persistent atrial fibrillation: Secondary | ICD-10-CM | POA: Diagnosis not present

## 2017-08-03 DIAGNOSIS — K449 Diaphragmatic hernia without obstruction or gangrene: Secondary | ICD-10-CM | POA: Diagnosis not present

## 2017-08-03 DIAGNOSIS — I2510 Atherosclerotic heart disease of native coronary artery without angina pectoris: Secondary | ICD-10-CM | POA: Diagnosis present

## 2017-08-03 DIAGNOSIS — Z8541 Personal history of malignant neoplasm of cervix uteri: Secondary | ICD-10-CM | POA: Diagnosis not present

## 2017-08-03 DIAGNOSIS — E785 Hyperlipidemia, unspecified: Secondary | ICD-10-CM | POA: Diagnosis present

## 2017-08-03 DIAGNOSIS — I110 Hypertensive heart disease with heart failure: Secondary | ICD-10-CM | POA: Diagnosis not present

## 2017-08-03 DIAGNOSIS — M5136 Other intervertebral disc degeneration, lumbar region: Secondary | ICD-10-CM | POA: Diagnosis present

## 2017-08-03 DIAGNOSIS — J45909 Unspecified asthma, uncomplicated: Secondary | ICD-10-CM | POA: Diagnosis not present

## 2017-08-03 DIAGNOSIS — E119 Type 2 diabetes mellitus without complications: Secondary | ICD-10-CM | POA: Diagnosis present

## 2017-08-03 DIAGNOSIS — Z7984 Long term (current) use of oral hypoglycemic drugs: Secondary | ICD-10-CM

## 2017-08-03 DIAGNOSIS — Z87891 Personal history of nicotine dependence: Secondary | ICD-10-CM

## 2017-08-03 DIAGNOSIS — E7800 Pure hypercholesterolemia, unspecified: Secondary | ICD-10-CM | POA: Diagnosis not present

## 2017-08-03 DIAGNOSIS — Z79899 Other long term (current) drug therapy: Secondary | ICD-10-CM

## 2017-08-03 DIAGNOSIS — Z7901 Long term (current) use of anticoagulants: Secondary | ICD-10-CM | POA: Diagnosis not present

## 2017-08-03 DIAGNOSIS — Z955 Presence of coronary angioplasty implant and graft: Secondary | ICD-10-CM

## 2017-08-03 DIAGNOSIS — I4891 Unspecified atrial fibrillation: Secondary | ICD-10-CM | POA: Diagnosis not present

## 2017-08-03 DIAGNOSIS — M797 Fibromyalgia: Secondary | ICD-10-CM | POA: Diagnosis not present

## 2017-08-03 DIAGNOSIS — K219 Gastro-esophageal reflux disease without esophagitis: Secondary | ICD-10-CM | POA: Diagnosis present

## 2017-08-03 DIAGNOSIS — E039 Hypothyroidism, unspecified: Secondary | ICD-10-CM | POA: Diagnosis present

## 2017-08-03 DIAGNOSIS — F418 Other specified anxiety disorders: Secondary | ICD-10-CM | POA: Diagnosis present

## 2017-08-03 DIAGNOSIS — I5021 Acute systolic (congestive) heart failure: Secondary | ICD-10-CM | POA: Diagnosis not present

## 2017-08-03 DIAGNOSIS — I25119 Atherosclerotic heart disease of native coronary artery with unspecified angina pectoris: Secondary | ICD-10-CM | POA: Diagnosis not present

## 2017-08-03 HISTORY — DX: Unspecified atrial fibrillation: I48.91

## 2017-08-03 LAB — BASIC METABOLIC PANEL
Anion gap: 8 (ref 5–15)
BUN: 23 mg/dL — ABNORMAL HIGH (ref 6–20)
CHLORIDE: 104 mmol/L (ref 101–111)
CO2: 27 mmol/L (ref 22–32)
Calcium: 9.3 mg/dL (ref 8.9–10.3)
Creatinine, Ser: 0.96 mg/dL (ref 0.44–1.00)
GFR calc non Af Amer: >60 mL/min (ref >60)
Glucose, Bld: 109 mg/dL — ABNORMAL HIGH (ref 65–99)
POTASSIUM: 3.6 mmol/L (ref 3.5–5.1)
SODIUM: 139 mmol/L (ref 135–145)

## 2017-08-03 LAB — MAGNESIUM: MAGNESIUM: 1.6 mg/dL — AB (ref 1.7–2.4)

## 2017-08-03 LAB — GLUCOSE, CAPILLARY: GLUCOSE-CAPILLARY: 87 mg/dL (ref 65–99)

## 2017-08-03 MED ORDER — PANTOPRAZOLE SODIUM 40 MG PO TBEC
40.0000 mg | DELAYED_RELEASE_TABLET | Freq: Every day | ORAL | Status: DC
  Administered 2017-08-03 – 2017-08-06 (×4): 40 mg via ORAL
  Filled 2017-08-03 (×3): qty 1

## 2017-08-03 MED ORDER — RIVAROXABAN 20 MG PO TABS
20.0000 mg | ORAL_TABLET | Freq: Every day | ORAL | Status: DC
  Administered 2017-08-03 – 2017-08-05 (×3): 20 mg via ORAL
  Filled 2017-08-03 (×3): qty 1

## 2017-08-03 MED ORDER — RALOXIFENE HCL 60 MG PO TABS: 60.0000 mg | ORAL_TABLET | Freq: Every day | ORAL | Status: DC

## 2017-08-03 MED ORDER — METFORMIN HCL 500 MG PO TABS
500.0000 mg | ORAL_TABLET | Freq: Two times a day (BID) | ORAL | Status: DC
  Administered 2017-08-03 – 2017-08-06 (×6): 500 mg via ORAL
  Filled 2017-08-03 (×6): qty 1

## 2017-08-03 MED ORDER — NITROGLYCERIN 0.4 MG SL SUBL: 0.4000 mg | SUBLINGUAL_TABLET | SUBLINGUAL | Status: DC | PRN

## 2017-08-03 MED ORDER — DULOXETINE HCL 60 MG PO CPEP
60.0000 mg | ORAL_CAPSULE | Freq: Every day | ORAL | Status: DC
  Administered 2017-08-03 – 2017-08-06 (×4): 60 mg via ORAL
  Filled 2017-08-03 (×4): qty 1

## 2017-08-03 MED ORDER — METOPROLOL SUCCINATE ER 100 MG PO TB24
100.0000 mg | ORAL_TABLET | Freq: Two times a day (BID) | ORAL | Status: DC
  Administered 2017-08-03 – 2017-08-06 (×5): 100 mg via ORAL
  Filled 2017-08-03 (×6): qty 1

## 2017-08-03 MED ORDER — FUROSEMIDE 20 MG PO TABS
20.0000 mg | ORAL_TABLET | Freq: Every day | ORAL | Status: DC
Start: 2017-08-04 — End: 2017-08-06
  Administered 2017-08-04 – 2017-08-06 (×3): 20 mg via ORAL
  Filled 2017-08-03 (×3): qty 1

## 2017-08-03 MED ORDER — VITAMIN D 1000 UNITS PO TABS
5000.0000 [IU] | ORAL_TABLET | Freq: Every day | ORAL | Status: DC
  Administered 2017-08-04 – 2017-08-05 (×2): 5000 [IU] via ORAL
  Filled 2017-08-03 (×2): qty 5

## 2017-08-03 MED ORDER — ALLOPURINOL 100 MG PO TABS
100.0000 mg | ORAL_TABLET | Freq: Every day | ORAL | Status: DC
  Administered 2017-08-04 – 2017-08-06 (×3): 100 mg via ORAL
  Filled 2017-08-03 (×3): qty 1

## 2017-08-03 MED ORDER — SOTALOL HCL 80 MG PO TABS
80.0000 mg | ORAL_TABLET | Freq: Two times a day (BID) | ORAL | Status: DC
  Administered 2017-08-03 – 2017-08-04 (×2): 80 mg via ORAL
  Filled 2017-08-03 (×3): qty 1

## 2017-08-03 MED ORDER — ATORVASTATIN CALCIUM 40 MG PO TABS
40.0000 mg | ORAL_TABLET | Freq: Every day | ORAL | Status: DC
  Administered 2017-08-03 – 2017-08-05 (×3): 40 mg via ORAL
  Filled 2017-08-03 (×3): qty 1

## 2017-08-03 MED ORDER — POTASSIUM CHLORIDE CRYS ER 20 MEQ PO TBCR
30.0000 meq | EXTENDED_RELEASE_TABLET | Freq: Once | ORAL | Status: AC
  Administered 2017-08-03: 30 meq via ORAL
  Filled 2017-08-03: qty 1

## 2017-08-03 MED ORDER — ZOLPIDEM TARTRATE 5 MG PO TABS
5.0000 mg | ORAL_TABLET | Freq: Every evening | ORAL | Status: DC | PRN
  Administered 2017-08-03 – 2017-08-05 (×3): 5 mg via ORAL
  Filled 2017-08-03 (×3): qty 1

## 2017-08-03 MED ORDER — INSULIN ASPART 100 UNIT/ML ~~LOC~~ SOLN: 0.0000 [IU] | Freq: Three times a day (TID) | SUBCUTANEOUS | Status: DC

## 2017-08-03 MED ORDER — ALBUTEROL SULFATE (2.5 MG/3ML) 0.083% IN NEBU: 2.5000 mg | INHALATION_SOLUTION | Freq: Four times a day (QID) | RESPIRATORY_TRACT | Status: DC | PRN

## 2017-08-03 MED ORDER — CYCLOBENZAPRINE HCL 10 MG PO TABS: 10.0000 mg | ORAL_TABLET | Freq: Two times a day (BID) | ORAL | Status: DC | PRN

## 2017-08-03 MED ORDER — MAGNESIUM SULFATE 2 GM/50ML IV SOLN
2.0000 g | Freq: Once | INTRAVENOUS | Status: AC
  Administered 2017-08-03: 2 g via INTRAVENOUS
  Filled 2017-08-03: qty 50

## 2017-08-03 MED ORDER — ACETAMINOPHEN 325 MG PO TABS: 650.0000 mg | ORAL_TABLET | ORAL | Status: DC | PRN

## 2017-08-03 MED ORDER — LEVOTHYROXINE SODIUM 25 MCG PO TABS
125.0000 ug | ORAL_TABLET | Freq: Every day | ORAL | Status: DC
  Administered 2017-08-04 – 2017-08-06 (×3): 125 ug via ORAL
  Filled 2017-08-03 (×3): qty 1

## 2017-08-03 NOTE — H&P (Signed)
Cardiology Admission History and Physical:   Patient ID: Melinda Mckinney; MRN: [id number]; DOB: [birth year]   Admission date: 08/03/2017  Primary Care Provider: Colon Branch, MD Primary Cardiologist: Dr. Oval Linsey Primary Electrophysiologist:  Dr. Rayann Heman  Chief Complaint:  Sotalol load  Patient Profile:   Melinda Mckinney is a 61 y.o. female with a history of history of coronary artery disease status post remote PCI in [place], persistent atrial fibrillation (recent onset), hypertension, hyperlipidemia, diabetes mellitus, and fibromyalgia comes for initiation of sotalol 2/2 symptomatic AFib  History of Present Illness:   [name] was seen yesterday in the office with Dr. Rayann Heman, given symptomatic AFib, plans for admission for sotalol load was planned.  She was first evaluated for new onset of atrial fibrillation by Dr. Oval Linsey in mid November. At that time, metoprolol was increased with reduction of HCTZ. She presented to the ED 5 days later with palpitations, vomiting, and headache. She underwent cardioversion in the ED with restoration of sinus rhythm. She followed up with Dr. Oval Linsey on 07/14/17, at which time she was found to be back in atrial fibrillation with a heart rate in the low 100s, BB was added, echo ordered  Hospitalized 07/27/17 with CHF, rate controlled AFib, and noted abnormal echo   The patient is seen using video translator, Ebony Hail, [phone]  She feels well outside her persistent fatigue which she feels is mostly 2/2 her AFib, no CP or rest SOB, no fever or illness of late.  She confirms the plan for admission to start Sotalol.  She confirms no missed doses of her xarelto for at least 3 weeks.  07/29/17: K+ 3.8 BUN/Creat 12/0.87 (clearance using today's weight is 96)  08/02/17 EKG is AFib 94bpm, QTc 471ms   Past Medical History:  Diagnosis Date  . Abnormal Pap smear of cervix   . Anemia   . Anxiety and depression   . Asthma   . Borderline diabetes   . CAD  (coronary artery disease) ~2009   stent   . Cervical cancer ([place])    "stage 1; had cryotherapy"  . Cervical dysplasia   . Chronic lower back pain   . DDD (degenerative disc disease), lumbar    Dr. Nelva Bush, recommends lumbar epidural steroid injections L5-S1 to the right  . Depression   . Fibromyalgia   . GERD (gastroesophageal reflux disease)   . Headache    "only when I'm short of breath"  (07/27/2017)  . High cholesterol   . History of hiatal hernia   . History of kidney stones   . Hypertension   . Hypothyroidism   . Insomnia 09/27/2013  . Lichen sclerosus    Vulvar area  . Osteoporosis   . Thyroid disease   . Vitamin D deficiency     Past Surgical History:  Procedure Laterality Date  . CARDIAC CATHETERIZATION  ~ 2008  . [place]; ~ 1978/1979; 1984  . ESOPHAGOMYOTOMY  2009  . GYNECOLOGIC CRYOSURGERY  X 2   "stage 1 cancer"  . HERNIA REPAIR  X 6   "all in my stomach" (07/27/2017)  . KNEE ARTHROSCOPY Right   . NEPHRECTOMY Right 1991   in [place], due to fibrosis and lithiasis  . TUBAL LIGATION       Medications Prior to Admission: Prior to Admission medications   Medication Sig Start Date End Date Taking? Authorizing Provider  albuterol (VENTOLIN HFA) 108 (90 Base) MCG/ACT inhaler Inhale 2 puffs into the lungs every 6 (six) hours as  needed for wheezing or shortness of breath. 04/25/17   Colon Branch, MD  allopurinol (ZYLOPRIM) 100 MG tablet Take 1 tablet (100 mg total) by mouth daily. 06/29/16   Colon Branch, MD  atorvastatin (LIPITOR) 40 MG tablet Take 1 tablet (40 mg total) by mouth at bedtime. 06/29/16   Colon Branch, MD  Cholecalciferol (VITAMIN D3) 5000 UNITS CAPS Take 1 capsule by mouth daily.     [provider]  clobetasol cream (TEMOVATE) 8.25 % Apply 1 application topically 2 (two) times daily. Apply twice a week first week then once weekly as needed 01/06/17   Colon Branch, MD  cyclobenzaprine (FLEXERIL) 10 MG tablet Take 1 tablet (10 mg  total) by mouth 2 (two) times daily as needed for muscle spasms. 02/24/16   Colon Branch, MD  DULoxetine (CYMBALTA) 60 MG capsule Take 1 capsule (60 mg total) daily by mouth. 07/03/17   Colon Branch, MD  furosemide (LASIX) 20 MG tablet Take 1 tablet (20 mg total) by mouth daily. 07/29/17   Leanor Kail, PA  levothyroxine (SYNTHROID, LEVOTHROID) 125 MCG tablet Take 1 tablet (125 mcg total) by mouth daily before breakfast. 05/01/17   Colon Branch, MD  metFORMIN (GLUCOPHAGE) 500 MG tablet Take 1 tablet (500 mg total) by mouth 2 (two) times daily with a meal. 06/29/16   Colon Branch, MD  metoprolol succinate (TOPROL XL) 100 MG 24 hr tablet Take 1 table by mouth twice a day. Take with or immediately following a meal. 07/29/17   Bhagat, Bhavinkumar, PA  omeprazole (PRILOSEC) 20 MG capsule Take 1 capsule (20 mg total) by mouth daily. 06/29/16   Colon Branch, MD  raloxifene (EVISTA) 60 MG tablet Take 1 tablet (60 mg total) by mouth daily. 09/08/16   Terrance Mass, MD  rivaroxaban (XARELTO) 20 MG TABS tablet Take 1 tablet (20 mg total) daily with supper by mouth. 06/29/17   Colon Branch, MD  zolpidem (AMBIEN) 5 MG tablet Take 1 tablet (5 mg total) by mouth at bedtime as needed for sleep. 09/08/16   Terrance Mass, MD     Allergies:    Allergies  Allergen Reactions  . Penicillins Swelling and Rash    Social History:   Social History   Socioeconomic History  . Marital status: Married    Spouse name: Not on file  . Number of children: 3  . Years of education: Not on file  . Highest education level: Not on file  Social Needs  . Financial resource strain: Not on file  . Food insecurity - worry: Not on file  . Food insecurity - inability: Not on file  . Transportation needs - medical: Not on file  . Transportation needs - non-medical: Not on file  Occupational History  . 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 Smoking status: Former Smoker    Packs/day: 2.00    Years: 28.00    Pack years:  56.00    Types: Cigarettes    Last attempt to quit: 2000    Years since quitting: 18.9  . Smokeless tobacco: Never Used  Substance and Sexual Activity  . Alcohol use: Yes    Comment: 07/27/2017 "3-4 drinks//month"  . Drug use: No  . Sexual activity: No    Comment: 1ST INTERCOURSE- 64, PARTNERS - 2  Other Topics Concern  . Not on file  Social History Narrative   Original from [place]   3 children, 2  alive   Household --pt and husband     Family History:  The patient's family history includes Breast cancer in her other; CAD in her brother and father; Diabetes in her father; Lung cancer in her father and mother; Stroke in her sister. There is no history of Colon cancer.    ROS:  Please see the history of present illness.  All other ROS reviewed and negative.     Physical Exam/Data:  There were no vitals filed for this visit. No intake or output data in the 24 hours ending 08/03/17 1617 There were no vitals filed for this visit. There is no height or weight on file to calculate BMI.  General:  Well nourished, well developed, in no acute distress HEENT: normal Lymph: no adenopathy Neck: no JVD Vascular: No carotid bruits Cardiac:  iRRR; soft SM, no gallops or rubs Lungs:  CTA b/l, no wheezing, rhonchi or rales  Abd: soft, nontender  Ext: no edema Musculoskeletal:  No deformities, BUE and BLE strength normal and equal Skin: warm and dry  Neuro:  No gross focal abnormalities noted Psych:  Normal affect    EKG:  08/02/17: AFib 94bpm,, QTc 469ms, presonally reviewed  Relevant CV Studies:  Echo 07/27/17 Study Conclusions  - Left ventricle: The cavity size was normal. Wall thickness was normal. Systolic function was mildly reduced. The estimated ejection fraction was in the range of 45% to 50%. - Aortic valve: There was mild regurgitation. - Mitral valve: There was moderate regurgitation. - Right atrium: The atrium was mildly dilated.   Myoview 07/28/17    There was no ST segment deviation noted during stress.  Nuclear stress EF: 28%. The left ventricular ejection fraction is severely decreased (<30%).  Defect 1: There is a small defect of mild severity present in the mid anterior, mid anteroseptal and apical septal location. This is a fixed defect and is likely due to breast or chest wall attenuation.  This is a high risk study based on reduction of LV function . There is no evidence of ischemia or infarction .    Laboratory Data:  Chemistry Recent Labs  Lab 07/28/17 0414 07/29/17 0427  NA 139 140  K 3.5 3.8  CL 105 104  CO2 26 24  GLUCOSE 103* 111*  BUN 12 12  CREATININE 0.85 0.87  CALCIUM 9.5 9.1  GFRNONAA >60 >60  GFRAA >60 >60  ANIONGAP 8 12    Recent Labs  Lab 07/27/17 1757  PROT 6.1*  ALBUMIN 3.4*  AST 21  ALT 20  ALKPHOS 87  BILITOT 0.8   Hematology Recent Labs  Lab 07/28/17 0414 07/29/17 0427  WBC 8.6 8.1  RBC 4.30 4.15  HGB 12.4 12.0  HCT 39.5 38.3  MCV 91.9 92.3  MCH 28.8 28.9  MCHC 31.4 31.3  RDW 14.1 14.0  PLT 277 241   Cardiac Enzymes Recent Labs  Lab 07/27/17 2210 07/28/17 0414 07/28/17 1038  TROPONINI <0.03 <0.03 <0.03    Recent Labs  Lab 07/27/17 1818  TROPIPOC 0.01    BNP Recent Labs  Lab 07/27/17 1757  BNP 415.5*    DDimer No results for input(s): DDIMER in the last 168 hours.  Radiology/Studies:  No results found.  Assessment and Plan:   1. Persistent AFib     CHA2DS2Vasc is 5, on Xarelto      Patient confirms compliance with Xarelto for the last 3 weeks at least, she states even longer      Daily labs, d/w  Dr. Lovena Le, [facility] to get started with last labs, EKG      If not in SR by Saturday, will try to arrange DCCV      Rates low 100s, will continue home metoprolol, follow HR with Sotalol  2. CAD     continue home meds     No anginal complaints, on BB, statin, no ASA with a/c  3. DM     continue home regime  4. HTN    continue home meds  5. CM, LVEF  45-50% by echo     Exam appears euvolemic     Continue home lasix    For questions or updates, please contact [facility] Please consult [email or web address] for contact info under Cardiology/STEMI.    Signed, Baldwin Jamaica, PA-C  08/03/2017 4:17 PM   EP Attending  Patient seen and examined. Agree with the findings as noted above by Tommye Standard, PA-C. The patient presents for sotalol initiation. She has a h/o CAD and has developed symptomatic atrial fib. Her exam is notable for a middle aged woman, NAD, CV reveals an IRIR rhythm. Lungs are clear. Neuro is non-focal and abdominal exam is benign. ECG reveals atrial fib with a  RVR. Will admit and initiate sotalol and follow her QTC.   Mikle Bosworth.D.

## 2017-08-04 DIAGNOSIS — E785 Hyperlipidemia, unspecified: Secondary | ICD-10-CM | POA: Diagnosis not present

## 2017-08-04 DIAGNOSIS — I481 Persistent atrial fibrillation: Secondary | ICD-10-CM | POA: Diagnosis not present

## 2017-08-04 DIAGNOSIS — I2510 Atherosclerotic heart disease of native coronary artery without angina pectoris: Secondary | ICD-10-CM | POA: Diagnosis not present

## 2017-08-04 DIAGNOSIS — R51 Headache: Secondary | ICD-10-CM | POA: Diagnosis not present

## 2017-08-04 LAB — GLUCOSE, CAPILLARY
GLUCOSE-CAPILLARY: 85 mg/dL (ref 65–99)
GLUCOSE-CAPILLARY: 127 mg/dL — AB (ref 65–99)
Glucose-Capillary: 112 mg/dL — ABNORMAL HIGH (ref 65–99)
Glucose-Capillary: 86 mg/dL (ref 65–99)

## 2017-08-04 LAB — BASIC METABOLIC PANEL
ANION GAP: 6 (ref 5–15)
BUN: 17 mg/dL (ref 6–20)
CHLORIDE: 105 mmol/L (ref 101–111)
CO2: 29 mmol/L (ref 22–32)
Calcium: 9.4 mg/dL (ref 8.9–10.3)
Creatinine, Ser: 0.91 mg/dL (ref 0.44–1.00)
GFR calc Af Amer: >60 mL/min (ref >60)
Glucose, Bld: 103 mg/dL — ABNORMAL HIGH (ref 65–99)
POTASSIUM: 4.1 mmol/L (ref 3.5–5.1)
SODIUM: 140 mmol/L (ref 135–145)

## 2017-08-04 LAB — MAGNESIUM: MAGNESIUM: 2.3 mg/dL (ref 1.7–2.4)

## 2017-08-04 MED ORDER — SOTALOL HCL 120 MG PO TABS
120.0000 mg | ORAL_TABLET | Freq: Two times a day (BID) | ORAL | Status: DC
  Administered 2017-08-04 – 2017-08-06 (×4): 120 mg via ORAL
  Filled 2017-08-04 (×4): qty 1

## 2017-08-04 NOTE — Progress Notes (Signed)
Progress Note  Patient Name: Melinda Mckinney Date of Encounter: 08/04/2017  Primary Cardiologist: Dr. Oval Linsey  Subjective   Feeling very well this AM, no CP or SOB, no dizziness, palpitations  Patient is seen using video interpretor [place]al [place] #[id number]  Inpatient Medications    Scheduled Meds: . allopurinol  100 mg Oral Daily  . atorvastatin  40 mg Oral QHS  . cholecalciferol  5,000 Units Oral Daily  . DULoxetine  60 mg Oral Daily  . furosemide  20 mg Oral Daily  . insulin aspart  0-9 Units Subcutaneous TID WC  . levothyroxine  125 mcg Oral QAC breakfast  . metFORMIN  500 mg Oral BID WC  . metoprolol succinate  100 mg Oral BID  . pantoprazole  40 mg Oral Daily  . rivaroxaban  20 mg Oral Q supper  . sotalol  80 mg Oral Q12H   Continuous Infusions:  PRN Meds: acetaminophen, albuterol, cyclobenzaprine, nitroGLYCERIN, zolpidem   Vital Signs    Vitals:   08/03/17 1652 08/03/17 2012 08/03/17 2243 08/04/17 0500  BP: (!) 94/59 103/70  105/85  Pulse: 84 88 (!) 104 (!) 111  Resp: 18 18  18   Temp: 97.8 F (36.6 C) 99 F (37.2 C)  97.8 F (36.6 C)  TempSrc: Oral Oral  Oral  SpO2: 96% 99%  96%  Weight: 203 lb 9.6 oz (92.4 kg)     Height: 5\' 3"  (1.6 m)       Intake/Output Summary (Last 24 hours) at 08/04/2017 0803 Last data filed at 08/04/2017 0748 Gross per 24 hour  Intake 600 ml  Output -  Net 600 ml   Filed Weights   08/03/17 1652  Weight: 203 lb 9.6 oz (92.4 kg)    Telemetry    AFib, 90's-110 - Personally Reviewed  ECG    AFib 83bpm, QTc 435ms - Personally Reviewed  Physical Exam   GEN: No acute distress.   Neck: No JVD Cardiac: iRRR, soft SM, rubs, or gallops.  Respiratory: Clear to auscultation bilaterally. GI: Soft, nontender, non-distended  MS: No edema; No deformity. Neuro:  Nonfocal  Psych: Normal affect   Labs    Chemistry Recent Labs  Lab 07/29/17 0427 08/03/17 1755 08/04/17 0452  NA 140 139 140  K 3.8 3.6 4.1  CL 104 104 105  CO2  24 27 29   GLUCOSE 111* 109* 103*  BUN 12 23* 17  CREATININE 0.87 0.96 0.91  CALCIUM 9.1 9.3 9.4  GFRNONAA >60 >60 >60  GFRAA >60 >60 >60  ANIONGAP 12 8 6      Hematology Recent Labs  Lab 07/29/17 0427  WBC 8.1  RBC 4.15  HGB 12.0  HCT 38.3  MCV 92.3  MCH 28.9  MCHC 31.3  RDW 14.0  PLT 241    Cardiac Enzymes Recent Labs  Lab 07/28/17 1038  TROPONINI <0.03   No results for input(s): TROPIPOC in the last 168 hours.   BNPNo results for input(s): BNP, PROBNP in the last 168 hours.   DDimer No results for input(s): DDIMER in the last 168 hours.   Radiology    No results found.  Cardiac Studies   Echo 07/27/17 Study Conclusions  - Left ventricle: The cavity size was normal. Wall thickness was normal. Systolic function was mildly reduced. The estimated ejection fraction was in the range of 45% to 50%. - Aortic valve: There was mild regurgitation. - Mitral valve: There was moderate regurgitation. - Right atrium: The atrium was mildly dilated.  Myoview 07/28/17  There was no ST segment deviation noted during stress.  Nuclear stress EF: 28%. The left ventricular ejection fraction is severely decreased (<30%).  Defect 1: There is a small defect of mild severity present in the mid anterior, mid anteroseptal and apical septal location. This is a fixed defect and is likely due to breast or chest wall attenuation.  This is a high risk study based on reduction of LV function . There is no evidence of ischemia or infarction .    Patient Profile     61 y.o. female with a history of history of coronary artery disease status post remote PCI in [place], persistent atrial fibrillation (recent onset), hypertension, hyperlipidemia, diabetes mellitus, and fibromyalgia comes for initiation of sotalol 2/2 symptomatic AFib   Assessment & Plan    1. Persistent AFib     CHA2DS2Vasc is 5, on Xarelto Patient confirms compliance with Xarelto for the last 3 weeks at  least, she states even longer      K+ 4.1      Mag 2.3      Creat 0.91, stable      QTc OK      Rates remain low 100s, will continue home metoprolol, follow HR with Sotalol  2. CAD     continue home meds     No anginal complaints, on BB, statin, no ASA with a/c  3. DM     continue home regime  4. HTN    continue home meds  5. CM, LVEF 45-50% by echo     Exam appears euvolemic     Continue home lasix      For questions or updates, please contact [facility] Please consult [email or web address] for contact info under Cardiology/STEMI.      Signed, Baldwin Jamaica, PA-C  08/04/2017, 8:03 AM     I have seen, examined the patient, and reviewed the above assessment and plan.  Changes to above are made where necessary.  On exam, iRRR. QT is stable. Will increase sotalol to 120mg  BID today.  NPO for cardioversion tomorrow.  This can be arranged by calling CRNA at [phone] early in the AM.  Anticipate discharge to home on Sunday if no concerns.  Co Sign: Thompson Grayer, MD 08/04/2017 9:32 PM

## 2017-08-04 NOTE — Plan of Care (Signed)
  Progressing Nutrition: Adequate nutrition will be maintained 08/04/2017 0029 - Progressing by Colonel Bald, RN

## 2017-08-04 NOTE — Discharge Instructions (Addendum)

## 2017-08-05 ENCOUNTER — Encounter (HOSPITAL_COMMUNITY)
Admission: AD | Disposition: A | Discharge status: Home / Self Care | Payer: Self-pay | Source: Ambulatory Visit | Attending: Internal Medicine

## 2017-08-05 ENCOUNTER — Encounter (HOSPITAL_COMMUNITY): Payer: Self-pay | Admitting: Anesthesiology

## 2017-08-05 ENCOUNTER — Other Ambulatory Visit: Payer: Self-pay

## 2017-08-05 ENCOUNTER — Inpatient Hospital Stay (HOSPITAL_COMMUNITY): Payer: Federal, State, Local not specified - PPO | Admitting: Certified Registered Nurse Anesthetist

## 2017-08-05 DIAGNOSIS — I5021 Acute systolic (congestive) heart failure: Secondary | ICD-10-CM | POA: Diagnosis not present

## 2017-08-05 DIAGNOSIS — R51 Headache: Secondary | ICD-10-CM | POA: Diagnosis not present

## 2017-08-05 DIAGNOSIS — I25119 Atherosclerotic heart disease of native coronary artery with unspecified angina pectoris: Secondary | ICD-10-CM | POA: Diagnosis not present

## 2017-08-05 DIAGNOSIS — I110 Hypertensive heart disease with heart failure: Secondary | ICD-10-CM | POA: Diagnosis not present

## 2017-08-05 DIAGNOSIS — E785 Hyperlipidemia, unspecified: Secondary | ICD-10-CM | POA: Diagnosis not present

## 2017-08-05 DIAGNOSIS — I2510 Atherosclerotic heart disease of native coronary artery without angina pectoris: Secondary | ICD-10-CM | POA: Diagnosis not present

## 2017-08-05 DIAGNOSIS — I481 Persistent atrial fibrillation: Secondary | ICD-10-CM | POA: Diagnosis not present

## 2017-08-05 DIAGNOSIS — I4891 Unspecified atrial fibrillation: Secondary | ICD-10-CM | POA: Diagnosis not present

## 2017-08-05 HISTORY — PX: CARDIOVERSION: SHX1299

## 2017-08-05 LAB — BASIC METABOLIC PANEL
Anion gap: 8 (ref 5–15)
BUN: 17 mg/dL (ref 6–20)
CALCIUM: 9.5 mg/dL (ref 8.9–10.3)
CO2: 27 mmol/L (ref 22–32)
Chloride: 104 mmol/L (ref 101–111)
Creatinine, Ser: 0.9 mg/dL (ref 0.44–1.00)
GFR calc Af Amer: >60 mL/min (ref >60)
GLUCOSE: 103 mg/dL — AB (ref 65–99)
POTASSIUM: 3.6 mmol/L (ref 3.5–5.1)
Sodium: 139 mmol/L (ref 135–145)

## 2017-08-05 LAB — GLUCOSE, CAPILLARY
GLUCOSE-CAPILLARY: 96 mg/dL (ref 65–99)
GLUCOSE-CAPILLARY: 94 mg/dL (ref 65–99)
Glucose-Capillary: 98 mg/dL (ref 65–99)
Glucose-Capillary: 84 mg/dL (ref 65–99)

## 2017-08-05 LAB — MAGNESIUM: Magnesium: 1.8 mg/dL (ref 1.7–2.4)

## 2017-08-05 SURGERY — CARDIOVERSION
Anesthesia: General

## 2017-08-05 MED ORDER — LIDOCAINE HCL (CARDIAC) 20 MG/ML IV SOLN
INTRAVENOUS | Status: DC | PRN
  Administered 2017-08-05: 40 mg via INTRATRACHEAL

## 2017-08-05 MED ORDER — PROPOFOL 10 MG/ML IV BOLUS
INTRAVENOUS | Status: DC | PRN
  Administered 2017-08-05: 80 mg via INTRAVENOUS

## 2017-08-05 MED ORDER — SODIUM CHLORIDE 0.9 % IV SOLN: 250.0000 mL | INTRAVENOUS | Status: DC | PRN

## 2017-08-05 MED ORDER — SODIUM CHLORIDE 0.9% FLUSH
3.0000 mL | Freq: Two times a day (BID) | INTRAVENOUS | Status: DC
  Administered 2017-08-05: 3 mL via INTRAVENOUS

## 2017-08-05 MED ORDER — SODIUM CHLORIDE 0.9% FLUSH: 3.0000 mL | INTRAVENOUS | Status: DC | PRN

## 2017-08-05 MED ORDER — ACETAMINOPHEN 325 MG PO TABS: 650.0000 mg | ORAL_TABLET | ORAL | Status: DC | PRN

## 2017-08-05 MED ORDER — PROPOFOL 10 MG/ML IV BOLUS
INTRAVENOUS | Status: AC
  Filled 2017-08-05: qty 20

## 2017-08-05 MED ORDER — LACTATED RINGERS IV SOLN
INTRAVENOUS | Status: DC | PRN
  Administered 2017-08-05: 10:00:00 via INTRAVENOUS

## 2017-08-05 MED ORDER — ONDANSETRON HCL 4 MG/2ML IJ SOLN: 4.0000 mg | Freq: Four times a day (QID) | INTRAMUSCULAR | Status: DC | PRN

## 2017-08-05 NOTE — H&P (View-Only) (Signed)
   Progress Note  Patient Name: Melinda Mckinney Date of Encounter: 08/05/2017  Primary Cardiologist: Allred  Subjective   No complaints. Ready for DCCV  Inpatient Medications    Scheduled Meds: . allopurinol  100 mg Oral Daily  . atorvastatin  40 mg Oral QHS  . cholecalciferol  5,000 Units Oral Daily  . DULoxetine  60 mg Oral Daily  . furosemide  20 mg Oral Daily  . insulin aspart  0-9 Units Subcutaneous TID WC  . levothyroxine  125 mcg Oral QAC breakfast  . metFORMIN  500 mg Oral BID WC  . metoprolol succinate  100 mg Oral BID  . pantoprazole  40 mg Oral Daily  . rivaroxaban  20 mg Oral Q supper  . sotalol  120 mg Oral Q12H   Continuous Infusions:  PRN Meds: acetaminophen, albuterol, cyclobenzaprine, nitroGLYCERIN, zolpidem   Vital Signs    Vitals:   08/04/17 1405 08/04/17 2017 08/04/17 2229 08/05/17 0600  BP: 99/65 106/69 104/68 109/69  Pulse:  70 (!) 117 89  Resp:  18  18  Temp: 99.6 F (37.6 C) 98.9 F (37.2 C)  98.1 F (36.7 C)  TempSrc: Oral Oral  Oral  SpO2: 98% 97%  94%  Weight:    202 lb 13.2 oz (92 kg)  Height:        Intake/Output Summary (Last 24 hours) at 08/05/2017 1003 Last data filed at 08/05/2017 9678 Gross per 24 hour  Intake 600 ml  Output 900 ml  Net -300 ml   Filed Weights   08/03/17 1652 08/05/17 0600  Weight: 203 lb 9.6 oz (92.4 kg) 202 lb 13.2 oz (92 kg)    Telemetry    Atrial fib - Personally Reviewed  ECG    Atrial fib - Personally Reviewed  Physical Exam   GEN: No acute distress.   Neck: No JVD Cardiac: IRIRR, no murmurs, rubs, or gallops.  Respiratory: Clear to auscultation bilaterally. GI: Soft, nontender, non-distended  MS: No edema; No deformity. Neuro:  Nonfocal  Psych: Normal affect   Labs    Chemistry Recent Labs  Lab 08/03/17 1755 08/04/17 0452 08/05/17 0403  NA 139 140 139  K 3.6 4.1 3.6  CL 104 105 104  CO2 27 29 27   GLUCOSE 109* 103* 103*  BUN 23* 17 17  CREATININE 0.96 0.91 0.90    CALCIUM 9.3 9.4 9.5  GFRNONAA >60 >60 >60  GFRAA >60 >60 >60  ANIONGAP 8 6 8      HematologyNo results for input(s): WBC, RBC, HGB, HCT, MCV, MCH, MCHC, RDW, PLT in the last 168 hours.  Cardiac EnzymesNo results for input(s): TROPONINI in the last 168 hours. No results for input(s): TROPIPOC in the last 168 hours.   BNPNo results for input(s): BNP, PROBNP in the last 168 hours.   DDimer No results for input(s): DDIMER in the last 168 hours.   Radiology    No results found.  Cardiac Studies   none  Patient Profile     61 y.o. female admitted for sotalol initiation now for DCCV  Assessment & Plan    1. Persistent atrial fib - she has been started on sotalol. She will undergo DCCV today. I have discussed the indications with the patient and she wishes to proceed.   For questions or updates, please contact [place] Please consult [email or web address] for contact info under Cardiology/STEMI.      Signed, Cristopher Peru, MD  08/05/2017, 10:03 AM

## 2017-08-05 NOTE — Anesthesia Preprocedure Evaluation (Signed)
Anesthesia Evaluation  Patient identified by MRN, date of birth, ID band Patient awake    Reviewed: Allergy & Precautions, H&P , NPO status , Patient's Chart, lab work & pertinent test results  Airway Mallampati: II   Neck ROM: full    Dental   Pulmonary asthma , former smoker,    breath sounds clear to auscultation       Cardiovascular hypertension, + angina + CAD, + Cardiac Stents and +CHF  + dysrhythmias Atrial Fibrillation  Rhythm:irregular Rate:Normal     Neuro/Psych  Headaches, PSYCHIATRIC DISORDERS Anxiety Depression  Neuromuscular disease    GI/Hepatic hiatal hernia, GERD  ,  Endo/Other  diabetes, Type 2Hypothyroidism   Renal/GU      Musculoskeletal  (+) Arthritis , Fibromyalgia -  Abdominal   Peds  Hematology   Anesthesia Other Findings   Reproductive/Obstetrics                             Anesthesia Physical Anesthesia Plan  ASA: III  Anesthesia Plan: General   Post-op Pain Management:    Induction: Intravenous  PONV Risk Score and Plan: 3 and Propofol infusion, Treatment may vary due to age or medical condition and TIVA  Airway Management Planned: Mask  Additional Equipment:   Intra-op Plan:   Post-operative Plan:   Informed Consent: I have reviewed the patients History and Physical, chart, labs and discussed the procedure including the risks, benefits and alternatives for the proposed anesthesia with the patient or authorized representative who has indicated his/her understanding and acceptance.     Plan Discussed with: CRNA and Anesthesiologist  Anesthesia Plan Comments:         Anesthesia Quick Evaluation

## 2017-08-05 NOTE — Progress Notes (Signed)
Patient to OR for DCCV.  Consent on chart.

## 2017-08-05 NOTE — Progress Notes (Signed)
Notified PA of potassium level of 3.6 (down from 4.3 yesterday).  BMP scheduled to be drawn in the am.  No further orders.

## 2017-08-05 NOTE — Anesthesia Postprocedure Evaluation (Signed)
Anesthesia Post Note  Patient: Melinda Mckinney  Procedure(s) Performed: CARDIOVERSION (N/A )     Patient location during evaluation: PACU Anesthesia Type: General Level of consciousness: awake and alert Pain management: pain level controlled Vital Signs Assessment: post-procedure vital signs reviewed and stable Respiratory status: spontaneous breathing, nonlabored ventilation, respiratory function stable and patient connected to nasal cannula oxygen Cardiovascular status: blood pressure returned to baseline and stable Postop Assessment: no apparent nausea or vomiting Anesthetic complications: no    Last Vitals:  Vitals:   08/05/17 1015 08/05/17 1045  BP: 109/79 107/75  Pulse: (!) 59 62  Resp: 15 14  Temp: (!) 36.4 C   SpO2: 94% 93%    Last Pain:  Vitals:   08/05/17 0803  TempSrc:   PainSc: 0-No pain                 [name] S

## 2017-08-05 NOTE — Progress Notes (Signed)
Patient's HR has climbed into the 120s to 140s at times.  Assess patient, she is not complaining of pain, sob, or palpitations.  Patient is asymptomatic, will continue to monitor the patient.

## 2017-08-05 NOTE — CV Procedure (Signed)
EP Procedure Note  Procedure performed: DCCV  Preoperative diagnosis: atrial fib with a RVR Postoperative diagnosis: atrial fib with a RVR  Description of the procedure: after informed consent was obtained, the patient was prepped in the usual manner. The electrodispersive pad was placed in the AP position. She was given 80 mg of propafol under the direction of the anesthesia service. She was converted with 200 joules of synchronized biphasic energy, restoring NSR.   Complications: none immediately  Conclusion: successful DCCV in a patient with persistent atrial fib.  Mikle Bosworth.D.

## 2017-08-05 NOTE — Interval H&P Note (Signed)
History and Physical Interval Note:  08/05/2017 10:03 AM  [place]  has presented today for surgery, with the diagnosis of A-FIB  The various methods of treatment have been discussed with the patient and family. After consideration of risks, benefits and other options for treatment, the patient has consented to  Procedure(s): CARDIOVERSION (N/A) as a surgical intervention .  The patient's history has been reviewed, patient examined, no change in status, stable for surgery.  I have reviewed the patient's chart and labs.  Questions were answered to the patient's satisfaction.     Cristopher Peru

## 2017-08-05 NOTE — Transfer of Care (Signed)
Immediate Anesthesia Transfer of Care Note  Patient: Melinda Mckinney  Procedure(s) Performed: CARDIOVERSION (N/A )  Patient Location: PACU  Anesthesia Type:General  Level of Consciousness: awake, alert , oriented and patient cooperative  Airway & Oxygen Therapy: Patient Spontanous Breathing and Patient connected to nasal cannula oxygen  Post-op Assessment: Report given to RN and Post -op Vital signs reviewed and stable  Post vital signs: Reviewed and stable  Last Vitals:  Vitals:   08/04/17 2229 08/05/17 0600  BP: 104/68 109/69  Pulse: (!) 117 89  Resp:  18  Temp:  36.7 C  SpO2:  94%    Last Pain:  Vitals:   08/05/17 0803  TempSrc:   PainSc: 0-No pain      Patients Stated Pain Goal: 0 (59/53/96 7289)  Complications: No apparent anesthesia complications

## 2017-08-05 NOTE — H&P (Signed)
   Progress Note  Patient Name: Melinda Mckinney Date of Encounter: 08/05/2017  Primary Cardiologist: Allred  Subjective   No complaints. Ready for DCCV  Inpatient Medications    Scheduled Meds: . allopurinol  100 mg Oral Daily  . atorvastatin  40 mg Oral QHS  . cholecalciferol  5,000 Units Oral Daily  . DULoxetine  60 mg Oral Daily  . furosemide  20 mg Oral Daily  . insulin aspart  0-9 Units Subcutaneous TID WC  . levothyroxine  125 mcg Oral QAC breakfast  . metFORMIN  500 mg Oral BID WC  . metoprolol succinate  100 mg Oral BID  . pantoprazole  40 mg Oral Daily  . rivaroxaban  20 mg Oral Q supper  . sotalol  120 mg Oral Q12H   Continuous Infusions:  PRN Meds: acetaminophen, albuterol, cyclobenzaprine, nitroGLYCERIN, zolpidem   Vital Signs    Vitals:   08/04/17 1405 08/04/17 2017 08/04/17 2229 08/05/17 0600  BP: 99/65 106/69 104/68 109/69  Pulse:  70 (!) 117 89  Resp:  18  18  Temp: 99.6 F (37.6 C) 98.9 F (37.2 C)  98.1 F (36.7 C)  TempSrc: Oral Oral  Oral  SpO2: 98% 97%  94%  Weight:    202 lb 13.2 oz (92 kg)  Height:        Intake/Output Summary (Last 24 hours) at 08/05/2017 1003 Last data filed at 08/05/2017 1829 Gross per 24 hour  Intake 600 ml  Output 900 ml  Net -300 ml   Filed Weights   08/03/17 1652 08/05/17 0600  Weight: 203 lb 9.6 oz (92.4 kg) 202 lb 13.2 oz (92 kg)    Telemetry    Atrial fib - Personally Reviewed  ECG    Atrial fib - Personally Reviewed  Physical Exam   GEN: No acute distress.   Neck: No JVD Cardiac: IRIRR, no murmurs, rubs, or gallops.  Respiratory: Clear to auscultation bilaterally. GI: Soft, nontender, non-distended  MS: No edema; No deformity. Neuro:  Nonfocal  Psych: Normal affect   Labs    Chemistry Recent Labs  Lab 08/03/17 1755 08/04/17 0452 08/05/17 0403  NA 139 140 139  K 3.6 4.1 3.6  CL 104 105 104  CO2 27 29 27   GLUCOSE 109* 103* 103*  BUN 23* 17 17  CREATININE 0.96 0.91 0.90    CALCIUM 9.3 9.4 9.5  GFRNONAA >60 >60 >60  GFRAA >60 >60 >60  ANIONGAP 8 6 8      HematologyNo results for input(s): WBC, RBC, HGB, HCT, MCV, MCH, MCHC, RDW, PLT in the last 168 hours.  Cardiac EnzymesNo results for input(s): TROPONINI in the last 168 hours. No results for input(s): TROPIPOC in the last 168 hours.   BNPNo results for input(s): BNP, PROBNP in the last 168 hours.   DDimer No results for input(s): DDIMER in the last 168 hours.   Radiology    No results found.  Cardiac Studies   none  Patient Profile     61 y.o. female admitted for sotalol initiation now for DCCV  Assessment & Plan    1. Persistent atrial fib - she has been started on sotalol. She will undergo DCCV today. I have discussed the indications with the patient and she wishes to proceed.   For questions or updates, please contact [place] Please consult [email or web address] for contact info under Cardiology/STEMI.      Signed, Cristopher Peru, MD  08/05/2017, 10:03 AM

## 2017-08-06 ENCOUNTER — Other Ambulatory Visit: Payer: Self-pay | Admitting: Physician Assistant

## 2017-08-06 DIAGNOSIS — R51 Headache: Secondary | ICD-10-CM | POA: Diagnosis not present

## 2017-08-06 DIAGNOSIS — Z79899 Other long term (current) drug therapy: Secondary | ICD-10-CM

## 2017-08-06 DIAGNOSIS — E785 Hyperlipidemia, unspecified: Secondary | ICD-10-CM | POA: Diagnosis not present

## 2017-08-06 DIAGNOSIS — I481 Persistent atrial fibrillation: Secondary | ICD-10-CM | POA: Diagnosis not present

## 2017-08-06 DIAGNOSIS — Z5181 Encounter for therapeutic drug level monitoring: Secondary | ICD-10-CM | POA: Diagnosis not present

## 2017-08-06 DIAGNOSIS — I2510 Atherosclerotic heart disease of native coronary artery without angina pectoris: Secondary | ICD-10-CM | POA: Diagnosis not present

## 2017-08-06 LAB — BASIC METABOLIC PANEL
Anion gap: 8 (ref 5–15)
BUN: 15 mg/dL (ref 6–20)
CHLORIDE: 105 mmol/L (ref 101–111)
CO2: 28 mmol/L (ref 22–32)
Calcium: 8.9 mg/dL (ref 8.9–10.3)
Creatinine, Ser: 1.03 mg/dL — ABNORMAL HIGH (ref 0.44–1.00)
GFR calc non Af Amer: 57 mL/min — ABNORMAL LOW (ref >60)
Glucose, Bld: 94 mg/dL (ref 65–99)
POTASSIUM: 4.1 mmol/L (ref 3.5–5.1)
SODIUM: 141 mmol/L (ref 135–145)

## 2017-08-06 LAB — GLUCOSE, CAPILLARY
GLUCOSE-CAPILLARY: 82 mg/dL (ref 65–99)
GLUCOSE-CAPILLARY: 80 mg/dL (ref 65–99)

## 2017-08-06 LAB — MAGNESIUM: MAGNESIUM: 1.7 mg/dL (ref 1.7–2.4)

## 2017-08-06 MED ORDER — METOPROLOL SUCCINATE ER 50 MG PO TB24: ORAL_TABLET | ORAL | 0 refills | Status: DC

## 2017-08-06 MED ORDER — SOTALOL HCL 120 MG PO TABS: 120.0000 mg | ORAL_TABLET | Freq: Two times a day (BID) | ORAL | 0 refills | Status: DC

## 2017-08-06 NOTE — Discharge Summary (Signed)
Discharge Summary    Patient ID: Melinda Mckinney,  MRN: [id number], DOB/AGE: 01/07/1956 61 y.o.  Admit date: 08/03/2017 Discharge date: 08/06/2017  Primary Care Provider: [facility] Primary Cardiologist: Dr. Oval Linsey / Dr. Rayann Heman   Discharge Diagnoses    Active Problems:   Encounter for monitoring sotalol therapy   Allergies Allergies  Allergen Reactions  . Penicillins Swelling and Rash    Has patient had a PCN reaction causing immediate rash, facial/tongue/throat swelling, SOB or lightheadedness with hypotension: No Has patient had a PCN reaction causing severe rash involving mucus membranes or skin necrosis: No Has patient had a PCN reaction that required hospitalization: No Has patient had a PCN reaction occurring within the last 10 years: No If all of the above answers are "NO", then may proceed with Cephalosporin use.     History of Present Illness     Melinda Mckinney is a 61 y.o. female with a history of coronary artery disease status post remote PCI in [place], persistent atrial fibrillation (recent onset), hypertension, hyperlipidemia, diabetes mellitus, and fibromyalgia who was admitted to [facility] on 08/03/17 for sotolol loading.   [name] was first evaluated for new onset of atrial fibrillation by Dr. Oval Linsey in mid November. At that time, metoprolol was increased with reduction of HCTZ. She presented to the ED 5 days later with palpitations, vomiting, and headache. She underwent cardioversion in the ED with restoration of sinus rhythm. She subtotally followed up with Dr. Oval Linsey on 07/14/17, at which time she was found to be back in atrial fibrillation with a heart rate in the low 100s. She complained of fatigue at that time, prompting referral for echocardiogram. Metoprolol was continued and diltiazem added for improved heart rate control.  She was admitted 12/13-12/15 for CHF. Nuclear stress test during that admission high risk based on reduction of LV function .  There was no evidence of ischemia or infarction.  He was seen by Dr. Rayann Heman in the office on 12/19 and it was decided to admit her for sotolol initiation for her symptomatic persistent afib. She was admitted to [facility] on 08/03/17.   Hospital Course     Consultants: none  She was brought into the hospital for sotalol loading.  She had been compliant with her Xarelto for the last 3 weeks.  Her electrolytes were stable.  QTC remained stable on sotalol 120 mg twice daily.  She did not convert and required DCCV on 12/22. She is maintaining NSR this morning. Plan to lower Toprol XL from 100mg  daily to 50mg  daily. She will be discharged today with follow-up in the A. fib clinic on 08/26/17.  The patient has had an uncomplicated hospital course and is recovering well. She has been seen by Dr. Lovena Le today and deemed ready for discharge home. All follow-up appointments have been scheduled. Discharge medications are listed below.  _____________  Discharge Vitals Blood pressure 106/62, pulse 66, temperature 97.8 F (36.6 C), temperature source Oral, resp. rate 18, height 5\' 3"  (1.6 m), weight 203 lb (92.1 kg), SpO2 100 %.  Filed Weights   08/03/17 1652 08/05/17 0600 08/06/17 0455  Weight: 203 lb 9.6 oz (92.4 kg) 202 lb 13.2 oz (92 kg) 203 lb (92.1 kg)    Labs & Radiologic Studies     CBC No results for input(s): WBC, NEUTROABS, HGB, HCT, MCV, PLT in the last 72 hours. Basic Metabolic Panel Recent Labs    08/05/17 0403 08/06/17 0321  NA 139 141  K 3.6  4.1  CL 104 105  CO2 27 28  GLUCOSE 103* 94  BUN 17 15  CREATININE 0.90 1.03*  CALCIUM 9.5 8.9  MG 1.8 1.7   Liver Function Tests No results for input(s): AST, ALT, ALKPHOS, BILITOT, PROT, ALBUMIN in the last 72 hours. No results for input(s): LIPASE, AMYLASE in the last 72 hours. Cardiac Enzymes No results for input(s): CKTOTAL, CKMB, CKMBINDEX, TROPONINI in the last 72 hours. BNP Invalid input(s): POCBNP D-Dimer No results for  input(s): DDIMER in the last 72 hours. Hemoglobin A1C No results for input(s): HGBA1C in the last 72 hours. Fasting Lipid Panel No results for input(s): CHOL, HDL, LDLCALC, TRIG, CHOLHDL, LDLDIRECT in the last 72 hours. Thyroid Function Tests No results for input(s): TSH, T4TOTAL, T3FREE, THYROIDAB in the last 72 hours.  Invalid input(s): FREET3  Dg Chest 2 View  Result Date: 07/27/2017 CLINICAL DATA:  Shortness of breath for 1 day. Coronary artery disease and hypertension. Atrial fibrillation. EXAM: CHEST  2 VIEW COMPARISON:  07/05/2017 FINDINGS: Stable mild to moderate cardiomegaly. Stable diffuse interstitial infiltrates, suspicious for mild interstitial edema. Tiny bilateral posterior pleural effusions. No evidence of pulmonary consolidation. IMPRESSION: No significant change in mild congestive heart failure, with tiny bilateral pleural effusions. Electronically Signed   By: Earle Gell M.D.   On: 07/27/2017 18:41   Ct Head Wo Contrast  Result Date: 07/27/2017 CLINICAL DATA:  Acute headache. EXAM: CT HEAD WITHOUT CONTRAST TECHNIQUE: Contiguous axial images were obtained from the base of the skull through the vertex without intravenous contrast. COMPARISON:  08/12/2013 FINDINGS: BRAIN: The ventricles and sulci are normal. No intraparenchymal hemorrhage, mass effect nor midline shift. No acute large vascular territory infarcts. Grey-white matter distinction is maintained. The basal ganglia are unremarkable. No abnormal extra-axial fluid collections. Basal cisterns are not effaced and midline. The brainstem and cerebellar hemispheres are without acute abnormalities. Partially empty pituitary sella. VASCULAR: Unremarkable. SKULL/SOFT TISSUES: No skull fracture. No significant soft tissue swelling. ORBITS/SINUSES: The included ocular globes and orbital contents are normal.The mastoid air cells are clear. The trace air-fluid level in the posterior left maxillary sinus with minimal mucosal thickening  of the ethmoid and right maxillary sinuses. The sphenoid is clear. Under pneumatized frontal sinus. Intact orbits and globes. OTHER: None. IMPRESSION: 1. Minimal acute left maxillary sinusitis. 2. No acute intracranial abnormality. Electronically Signed   By: Ashley Royalty M.D.   On: 07/27/2017 20:11   Nm Myocar Multi W/spect W/wall Motion / Ef  Result Date: 07/28/2017  There was no ST segment deviation noted during stress.  Nuclear stress EF: 28%. The left ventricular ejection fraction is severely decreased (<30%).  Defect 1: There is a small defect of mild severity present in the mid anterior, mid anteroseptal and apical septal location. This is a fixed defect and is likely due to breast or chest wall attenuation.  This is a high risk study based on reduction of LV function . There is no evidence of ischemia or infarction .      Diagnostic Studies/Procedures    none _____________    Disposition   Pt is being discharged home today in good condition.  Follow-up Plans & Appointments    Follow-up Information    [facility] Follow up on 08/16/2017.   Specialty:  Cardiology Why:  11:30AM Contact information: [address] [id number] [place] [phone]       Thompson Grayer, MD Follow up on 09/07/2017.   Specialty:  Cardiology Why:  12:15PM Contact information: [address] [place] [address] [phone]          Discharge Instructions    Diet - low sodium heart healthy   Complete by:  As directed    Increase activity slowly   Complete by:  As directed       Discharge Medications     Medication List    TAKE these medications   albuterol 108 (90 Base) MCG/ACT inhaler Commonly known as:  VENTOLIN HFA Inhale 2 puffs into the lungs every 6 (six) hours as needed for wheezing or shortness of breath.   allopurinol 100 MG tablet Commonly known as:  ZYLOPRIM Take 1 tablet (100 mg total)  by mouth daily.   atorvastatin 40 MG tablet Commonly known as:  LIPITOR Take 1 tablet (40 mg total) by mouth at bedtime.   clobetasol cream 0.05 % Commonly known as:  TEMOVATE Apply 1 application topically 2 (two) times daily. Apply twice a week first week then once weekly as needed   cyclobenzaprine 10 MG tablet Commonly known as:  FLEXERIL Take 1 tablet (10 mg total) by mouth 2 (two) times daily as needed for muscle spasms.   DULoxetine 60 MG capsule Commonly known as:  CYMBALTA Take 1 capsule (60 mg total) daily by mouth.   furosemide 20 MG tablet Commonly known as:  LASIX Take 1 tablet (20 mg total) by mouth daily.   levothyroxine 125 MCG tablet Commonly known as:  SYNTHROID, LEVOTHROID Take 1 tablet (125 mcg total) by mouth daily before breakfast.   metFORMIN 500 MG tablet Commonly known as:  GLUCOPHAGE Take 1 tablet (500 mg total) by mouth 2 (two) times daily with a meal.   metoprolol succinate 50 MG 24 hr tablet Commonly known as:  TOPROL XL Take 1 table by mouth twice a day. Take with or immediately following a meal. What changed:  medication strength   omeprazole 20 MG capsule Commonly known as:  PRILOSEC Take 1 capsule (20 mg total) by mouth daily.   raloxifene 60 MG tablet Commonly known as:  EVISTA Take 1 tablet (60 mg total) by mouth daily.   rivaroxaban 20 MG Tabs tablet Commonly known as:  XARELTO Take 1 tablet (20 mg total) daily with supper by mouth.   sotalol 120 MG tablet Commonly known as:  BETAPACE Take 1 tablet (120 mg total) by mouth every 12 (twelve) hours.   Vitamin D3 5000 units Caps Take 1 capsule by mouth daily.   zolpidem 5 MG tablet Commonly known as:  AMBIEN Take 1 tablet (5 mg total) by mouth at bedtime as needed for sleep.         Outstanding Labs/Studies   BMET  Duration of Discharge Encounter   Greater than 30 minutes including physician time.  Signed, Angelena Form PA-C 08/06/2017, 9:32 AM  EP  Attending  Patient seen and examined. Agree with above. She is stable for DC home. Usual followup as noted above. Her QT is stable.  Mikle Bosworth.D.

## 2017-08-07 ENCOUNTER — Encounter (HOSPITAL_COMMUNITY): Payer: Self-pay | Admitting: Internal Medicine

## 2017-08-07 ENCOUNTER — Telehealth: Payer: Self-pay

## 2017-08-07 NOTE — Telephone Encounter (Signed)
[facility] follow up call mede to patient. Left message for return call.

## 2017-08-09 ENCOUNTER — Ambulatory Visit (INDEPENDENT_AMBULATORY_CARE_PROVIDER_SITE_OTHER): Payer: Federal, State, Local not specified - PPO | Admitting: Internal Medicine

## 2017-08-09 ENCOUNTER — Encounter: Payer: Self-pay | Admitting: Internal Medicine

## 2017-08-09 VITALS — BP 110/70 | HR 64 | Ht 63.25 in | Wt 207.0 lb

## 2017-08-09 DIAGNOSIS — M810 Age-related osteoporosis without current pathological fracture: Secondary | ICD-10-CM

## 2017-08-09 DIAGNOSIS — E039 Hypothyroidism, unspecified: Secondary | ICD-10-CM | POA: Diagnosis not present

## 2017-08-09 DIAGNOSIS — E559 Vitamin D deficiency, unspecified: Secondary | ICD-10-CM | POA: Diagnosis not present

## 2017-08-09 NOTE — Patient Instructions (Addendum)
Please continue Levothyroxine 125 mcg daily.  Take the thyroid hormone every day, with water, at least 30 minutes before breakfast, separated by at least 4 hours from: - acid reflux medications - calcium - iron - multivitamins  Restart vitamin D3: - Vitamin D 7000 units daily   Please come back for labs in 6-8 weeks.  Please have another bone density test at [facility].  Please return in 1 year.

## 2017-08-09 NOTE — Progress Notes (Addendum)
Patient ID: Melinda Mckinney, female   DOB: 1955-09-23, 61 y.o.   MRN: [id number]   HPI  Melinda Mckinney is a 61 y.o.-year-old female, returning for f/u for vitamin D deficiency, osteoporosis, and hypothyroidism. Last visit 1 year ago  She was recently admitted with A fib, acute CHF. She was started on Sotalol >> then had cardioversion. Now on Metoprolol, Xarelto. Stopped HCTZ.  Osteoporosis.  I reviewed latest DXA scan reports: Date L1-L4 T score FN T score  09/04/2014 ([facility])  -2.8 LFN: -1.1 RFN: -1.2  03/25/2007 ([facility])  -2.6   Not on med for OP.  No fractures. She had a fall 1 mo ago >> she tripped.  Vitamin D def  - dx in 08/2014 - No h/o hypocalcemia.  I reviewed pt's pertinent labs: Lab Results  Component Value Date   VD25OH 40 09/08/2016   VD25OH 29.88 (L) 08/09/2016   VD25OH 24 (L) 09/03/2015   VD25OH 29.63 (L) 08/07/2015   VD25OH 26.02 (L) 01/19/2015   VD25OH 17 (L) 12/12/2014   VD25OH 16 (L) 12/10/2014   VD25OH 24 (L) 09/12/2014   Pt was started on Ergocalciferol 50,000 units weekly in 09/2014 >> switched to:  Vitamin D 5000 units daily >> increased to 7000 units 07/2016  - but ran out 1 mo ago...  Stopped Vitamin D in MVI once a day   Reviewed hx: A celiac panel was negative on 12/12/2014. She has abdominal pain, bloating, nausea and sometimes vomiting. She was seen by GI >> EGD >> had Sx for Acalasia. No h/o celiac ds.  Lab Results  Component Value Date   PTH 31 09/12/2014   CALCIUM 8.9 08/06/2017   CALCIUM 9.5 08/05/2017   CALCIUM 9.4 08/04/2017   CALCIUM 9.3 08/03/2017   CALCIUM 9.1 07/29/2017   CALCIUM 9.5 07/28/2017   CALCIUM 9.1 07/27/2017   CALCIUM 9.1 07/05/2017   CALCIUM 9.9 06/29/2017   CALCIUM 10.0 01/06/2017   + she does have a history of kidney stones. R nephrectomy for hydronephrosis 2/2 kidney stone.  At that time, she also had surgery for incarcerated hernia - had several hernias  No history of CKD. Last BUN/Cr: Lab Results  Component  Value Date   BUN 15 08/06/2017   CREATININE 1.03 (H) 08/06/2017   She has hypothyroidism: Pt is on levothyroxine 125 mcg daily, taken: - in am - fasting - at least 60 min from b'fast - no Ca, Fe, MVI - She now takes PPIs after lunch - not on Biotin  Last TSH: Lab Results  Component Value Date   TSH 4.801 (H) 07/28/2017   TSH 0.69 06/29/2017   TSH 2.02 01/06/2017   TSH 0.52 06/29/2016   TSH 3.37 12/28/2015   TSH 0.72 08/07/2015   TSH 2.27 04/09/2015   TSH 3.58 02/06/2015   TSH 4.76 (H) 12/23/2014   TSH 0.96 04/16/2014   Pt denies: - feeling nodules in neck - hoarseness - dysphagia - choking - SOB with lying down  + FH with thyroid ds: Mother, daughter, and son. No FH of thyroid cancer. No h/o radiation tx to head or neck.  No seaweed or kelp. No recent contrast studies. No herbal supplements. No Biotin use. No recent steroids use.   I reviewed her chart and she also has a history of prior nephrectomy, OA.   ROS: Constitutional: + weight loss (40 lbs in last year!! - intentional)+, + fatigue, no subjective hyperthermia, no subjective hypothermia Eyes: no blurry vision, no xerophthalmia ENT: no sore throat, +  SEE hpi Cardiovascular: + CP/+ SOB/no palpitations/no leg swelling Respiratory: no cough/+ SOB/no wheezing Gastrointestinal: no N/no V/no D/no C/no acid reflux Musculoskeletal: no muscle aches/no joint aches Skin: no rashes, no hair loss Neurological: no tremors/no numbness/no tingling/no dizziness, + HA  I reviewed pt's medications, allergies, PMH, social hx, family hx, and changes were documented in the history of present illness. Otherwise, unchanged from my initial visit note.  Past Medical History:  Diagnosis Date  . Abnormal Pap smear of cervix   . Anemia   . Anxiety and depression   . Asthma   . Atrial fibrillation ([facility]) 07/2017  . Borderline diabetes   . CAD (coronary artery disease) ~2009   stent   . Cervical cancer ([facility])    "stage 1; had  cryotherapy"  . Cervical dysplasia   . Chronic lower back pain   . DDD (degenerative disc disease), lumbar    Dr. Nelva Bush, recommends lumbar epidural steroid injections L5-S1 to the right  . Depression   . Fibromyalgia   . GERD (gastroesophageal reflux disease)   . Headache    "only when I'm short of breath"  (07/27/2017)  . High cholesterol   . History of hiatal hernia   . History of kidney stones   . Hypertension   . Hypothyroidism   . Insomnia 09/27/2013  . Lichen sclerosus    Vulvar area  . Osteoporosis   . Thyroid disease   . Vitamin D deficiency    Past Surgical History:  Procedure Laterality Date  . CARDIAC CATHETERIZATION  ~ 2008  . CARDIOVERSION N/A 08/05/2017   Procedure: CARDIOVERSION;  Surgeon: Evans Lance, MD;  Location: [facility];  Service: Cardiovascular;  Laterality: N/A;  . [place]; ~ 1978/1979; 1984  . ESOPHAGOMYOTOMY  2009  . GYNECOLOGIC CRYOSURGERY  X 2   "stage 1 cancer"  . HERNIA REPAIR  X 6   "all in my stomach" (07/27/2017)  . KNEE ARTHROSCOPY Right   . NEPHRECTOMY Right 1991   in [place], due to fibrosis and lithiasis  . TUBAL LIGATION     Social History   Socioeconomic History  . Marital status: Married    Spouse name: Not on file  . Number of children: 3  . Years of education: Not on file  . Highest education level: Not on file  Social Needs  . Financial resource strain: Not on file  . Food insecurity - worry: Not on file  . Food insecurity - inability: Not on file  . Transportation needs - medical: Not on file  . Transportation needs - non-medical: Not on file  Occupational History  . 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 Smoking status: Former Smoker    Packs/day: 2.00    Years: 28.00    Pack years: 56.00    Types: Cigarettes    Last attempt to quit: 2000    Years since quitting: 18.9  . Smokeless tobacco: Never Used  Substance and Sexual Activity  . Alcohol use: Yes    Comment: 07/27/2017 "3-4  drinks//month"  . Drug use: No  . Sexual activity: No    Comment: 1ST INTERCOURSE- 63, PARTNERS - 2  Other Topics Concern  . Not on file  Social History Narrative   Original from [place]   3 children, 2 alive   Household --pt and husband    Current Outpatient Medications on File Prior to Visit  Medication Sig Dispense Refill  . albuterol (VENTOLIN HFA) 108 (  90 Base) MCG/ACT inhaler Inhale 2 puffs into the lungs every 6 (six) hours as needed for wheezing or shortness of breath. 1 Inhaler 1  . allopurinol (ZYLOPRIM) 100 MG tablet Take 1 tablet (100 mg total) by mouth daily. 90 tablet 2  . atorvastatin (LIPITOR) 40 MG tablet Take 1 tablet (40 mg total) by mouth at bedtime. 90 tablet 2  . Cholecalciferol (VITAMIN D3) 5000 UNITS CAPS Take 1 capsule by mouth daily.     . clobetasol cream (TEMOVATE) 5.36 % Apply 1 application topically 2 (two) times daily. Apply twice a week first week then once weekly as needed 30 g 1  . cyclobenzaprine (FLEXERIL) 10 MG tablet Take 1 tablet (10 mg total) by mouth 2 (two) times daily as needed for muscle spasms. 40 tablet 2  . DULoxetine (CYMBALTA) 60 MG capsule Take 1 capsule (60 mg total) daily by mouth. 90 capsule 1  . furosemide (LASIX) 20 MG tablet Take 1 tablet (20 mg total) by mouth daily. 30 tablet 3  . levothyroxine (SYNTHROID, LEVOTHROID) 125 MCG tablet Take 1 tablet (125 mcg total) by mouth daily before breakfast. 90 tablet 0  . metFORMIN (GLUCOPHAGE) 500 MG tablet Take 1 tablet (500 mg total) by mouth 2 (two) times daily with a meal. 180 tablet 2  . metoprolol succinate (TOPROL-XL) 50 MG 24 hr tablet Take 1 table by mouth twice a day. Take with or immediately following a meal. 30 tablet 0  . omeprazole (PRILOSEC) 20 MG capsule Take 1 capsule (20 mg total) by mouth daily. 90 capsule 2  . raloxifene (EVISTA) 60 MG tablet Take 1 tablet (60 mg total) by mouth daily. 30 tablet 11  . rivaroxaban (XARELTO) 20 MG TABS tablet Take 1 tablet (20 mg total)  daily with supper by mouth. 30 tablet 1  . sotalol (BETAPACE) 120 MG tablet Take 1 tablet (120 mg total) by mouth every 12 (twelve) hours. 60 tablet 0  . zolpidem (AMBIEN) 5 MG tablet Take 1 tablet (5 mg total) by mouth at bedtime as needed for sleep. 90 tablet 2   No current facility-administered medications on file prior to visit.    Allergies  Allergen Reactions  . Penicillins Swelling and Rash    Has patient had a PCN reaction causing immediate rash, facial/tongue/throat swelling, SOB or lightheadedness with hypotension: No Has patient had a PCN reaction causing severe rash involving mucus membranes or skin necrosis: No Has patient had a PCN reaction that required hospitalization: No Has patient had a PCN reaction occurring within the last 10 years: No If all of the above answers are "NO", then may proceed with Cephalosporin use.   Family History  Problem Relation Age of Onset  . Breast cancer Other        aunt   . CAD Brother   . Lung cancer Mother        F and M  . Diabetes Father        F and mother   . CAD Father   . Lung cancer Father   . Stroke Sister   . Colon cancer Neg Hx    PE: BP 110/70   Pulse 64   Ht 5' 3.25" (1.607 m)   Wt 207 lb (93.9 kg)   SpO2 97%   BMI 36.38 kg/m  Body mass index is 36.38 kg/m. Wt Readings from Last 3 Encounters:  08/09/17 207 lb (93.9 kg)  08/06/17 203 lb (92.1 kg)  08/02/17 205 lb (93 kg)  Constitutional: overweight, in NAD Eyes: PERRLA, EOMI, no exophthalmos ENT: moist mucous membranes, no thyromegaly, no cervical lymphadenopathy Cardiovascular: RRR, No MRG Respiratory: CTA B Gastrointestinal: abdomen soft, NT, ND, BS+ Musculoskeletal: no deformities, strength intact in all 4 Skin: moist, warm, no rashes Neurological: no tremor with outstretched hands, DTR normal in all 4  Assessment: 1.  Osteoporosis  2. Vitamin D deficiency  2. Hypothyroidism  PLAN: 1. Osteoporosis - No fractures since last visit but had a fall  (tripped) - Reviewed together her latest DEXA scan from 2 years ago >> osteoporotic at the level of the spine - We will repeat a bone density scan this year >> needs to have [facility] dose [facility]  2.  Patient with persistent vitamin D insufficiency despite supplementation.  She was previously on ergocalciferol 50,000 units once a week, then switched to vitamin D 5000 units daily + multivitamin. At last visit, since Vitamin D level was still low, I advised her to increase her vitamin D supplement to 7000 units daily.  A subsequent vitamin D level was normal in 08/2016.  - she ran out of the vit D med 1 mo ago >> advised her to restart and will recheck level in 1.5-2 mo. - Reviewing her chart, there is no apparent reason for malabsorption: Celiac workup was negative, no colon diseases, she had a history of Barrett's esophagus and has surveillance EGDs and also had achalasia surgery - We will check a vitamin D level in 1.5 mo - I will see the patient back in 1 year  3. Hypothyroidism - latest TSH reviewed with pt >> slightly high earlier this month, at 4.8. We will not change the dose now, but repeat labs in 6-8 weeks. - she continues on LT4 125 mcg daily - pt feels good on this dose. - we discussed about taking the thyroid hormone every day, with water, >30 minutes before breakfast, separated by >4 hours from acid reflux medications, calcium, iron, multivitamins. Pt. is taking it correctly - will check thyroid tests in 1.5-2 mo: TSH and fT4 - If labs are abnormal, she will need to return for repeat TFTs in 1.5 months - OTW, RTC in 1 year  CC: Dr. Uvaldo Rising  Philemon Kingdom, MD PhD [facility] Endocrinology

## 2017-08-16 ENCOUNTER — Ambulatory Visit (HOSPITAL_COMMUNITY)
Admission: RE | Admit: 2017-08-16 | Discharge: 2017-08-16 | Disposition: A | Discharge status: Home / Self Care | Payer: Federal, State, Local not specified - PPO | Source: Ambulatory Visit | Attending: Nurse Practitioner | Admitting: Nurse Practitioner

## 2017-08-16 ENCOUNTER — Encounter (HOSPITAL_COMMUNITY): Payer: Self-pay | Admitting: Nurse Practitioner

## 2017-08-16 ENCOUNTER — Other Ambulatory Visit (HOSPITAL_COMMUNITY): Payer: Self-pay | Admitting: *Deleted

## 2017-08-16 VITALS — BP 112/64 | HR 117 | Ht 63.25 in | Wt 209.0 lb

## 2017-08-16 DIAGNOSIS — J45909 Unspecified asthma, uncomplicated: Secondary | ICD-10-CM | POA: Diagnosis not present

## 2017-08-16 DIAGNOSIS — R7303 Prediabetes: Secondary | ICD-10-CM | POA: Insufficient documentation

## 2017-08-16 DIAGNOSIS — F329 Major depressive disorder, single episode, unspecified: Secondary | ICD-10-CM | POA: Insufficient documentation

## 2017-08-16 DIAGNOSIS — I2510 Atherosclerotic heart disease of native coronary artery without angina pectoris: Secondary | ICD-10-CM | POA: Insufficient documentation

## 2017-08-16 DIAGNOSIS — I481 Persistent atrial fibrillation: Secondary | ICD-10-CM

## 2017-08-16 DIAGNOSIS — F419 Anxiety disorder, unspecified: Secondary | ICD-10-CM | POA: Diagnosis not present

## 2017-08-16 DIAGNOSIS — Z8541 Personal history of malignant neoplasm of cervix uteri: Secondary | ICD-10-CM | POA: Diagnosis not present

## 2017-08-16 DIAGNOSIS — Z7984 Long term (current) use of oral hypoglycemic drugs: Secondary | ICD-10-CM | POA: Diagnosis not present

## 2017-08-16 DIAGNOSIS — Z905 Acquired absence of kidney: Secondary | ICD-10-CM | POA: Insufficient documentation

## 2017-08-16 DIAGNOSIS — Z87891 Personal history of nicotine dependence: Secondary | ICD-10-CM | POA: Diagnosis not present

## 2017-08-16 DIAGNOSIS — M797 Fibromyalgia: Secondary | ICD-10-CM | POA: Insufficient documentation

## 2017-08-16 DIAGNOSIS — Z7901 Long term (current) use of anticoagulants: Secondary | ICD-10-CM | POA: Diagnosis not present

## 2017-08-16 DIAGNOSIS — E039 Hypothyroidism, unspecified: Secondary | ICD-10-CM | POA: Insufficient documentation

## 2017-08-16 DIAGNOSIS — Z880 Allergy status to penicillin: Secondary | ICD-10-CM | POA: Diagnosis not present

## 2017-08-16 DIAGNOSIS — I10 Essential (primary) hypertension: Secondary | ICD-10-CM | POA: Diagnosis not present

## 2017-08-16 DIAGNOSIS — K219 Gastro-esophageal reflux disease without esophagitis: Secondary | ICD-10-CM | POA: Diagnosis not present

## 2017-08-16 DIAGNOSIS — E7800 Pure hypercholesterolemia, unspecified: Secondary | ICD-10-CM | POA: Diagnosis not present

## 2017-08-16 DIAGNOSIS — G4700 Insomnia, unspecified: Secondary | ICD-10-CM | POA: Insufficient documentation

## 2017-08-16 DIAGNOSIS — Z79890 Hormone replacement therapy (postmenopausal): Secondary | ICD-10-CM | POA: Diagnosis not present

## 2017-08-16 DIAGNOSIS — I4819 Other persistent atrial fibrillation: Secondary | ICD-10-CM

## 2017-08-16 DIAGNOSIS — Z79899 Other long term (current) drug therapy: Secondary | ICD-10-CM | POA: Insufficient documentation

## 2017-08-16 DIAGNOSIS — E559 Vitamin D deficiency, unspecified: Secondary | ICD-10-CM | POA: Diagnosis not present

## 2017-08-16 LAB — CBC
HCT: 38 % (ref 36.0–46.0)
Hemoglobin: 11.4 g/dL — ABNORMAL LOW (ref 12.0–15.0)
MCH: 27.9 pg (ref 26.0–34.0)
MCHC: 30 g/dL (ref 30.0–36.0)
MCV: 92.9 fL (ref 78.0–100.0)
PLATELETS: 283 10*3/uL (ref 150–400)
RBC: 4.09 MIL/uL (ref 3.87–5.11)
RDW: 14.1 % (ref 11.5–15.5)
WBC: 8.6 10*3/uL (ref 4.0–10.5)

## 2017-08-16 LAB — BASIC METABOLIC PANEL
ANION GAP: 4 — AB (ref 5–15)
BUN: 15 mg/dL (ref 6–20)
CALCIUM: 9.4 mg/dL (ref 8.9–10.3)
CO2: 27 mmol/L (ref 22–32)
Chloride: 109 mmol/L (ref 101–111)
Creatinine, Ser: 0.89 mg/dL (ref 0.44–1.00)
GFR calc Af Amer: >60 mL/min (ref >60)
Glucose, Bld: 103 mg/dL — ABNORMAL HIGH (ref 65–99)
Potassium: 4.2 mmol/L (ref 3.5–5.1)
Sodium: 140 mmol/L (ref 135–145)

## 2017-08-16 LAB — MAGNESIUM: MAGNESIUM: 1.9 mg/dL (ref 1.7–2.4)

## 2017-08-16 NOTE — Progress Notes (Signed)
Primary Care Physician: Colon Branch, MD Referring Physician: Dr. Rayann Heman Cardiologist: Dr. Merryl Hacker Melinda Mckinney is a 62 y.o. female with a h/o persistent afib that is in the afib clinic for f/u recent hospitalization for sotalol loading. She did require cardioversion before discharge and left in SR.  However, in the clinic today, she is back in Afib. She felt good for 2-3 days after discharge. She is being compliant with sotalol and xaelto without missed doses. She speaks some English but husband is acting as Veterinary surgeon.  Today, she denies symptoms of palpitations, chest pain, shortness of breath, orthopnea, PND, lower extremity edema, dizziness, presyncope, syncope, or neurologic sequela. The patient is tolerating medications without difficulties and is otherwise without complaint today.   Past Medical History:  Diagnosis Date  . Abnormal Pap smear of cervix   . Anemia   . Anxiety and depression   . Asthma   . Atrial fibrillation ([facility]) 07/2017  . Borderline diabetes   . CAD (coronary artery disease) ~2009   stent   . Cervical cancer ([facility])    "stage 1; had cryotherapy"  . Cervical dysplasia   . Chronic lower back pain   . DDD (degenerative disc disease), lumbar    Dr. Nelva Bush, recommends lumbar epidural steroid injections L5-S1 to the right  . Depression   . Fibromyalgia   . GERD (gastroesophageal reflux disease)   . Headache    "only when I'm short of breath"  (07/27/2017)  . High cholesterol   . History of hiatal hernia   . History of kidney stones   . Hypertension   . Hypothyroidism   . Insomnia 09/27/2013  . Lichen sclerosus    Vulvar area  . Osteoporosis   . Thyroid disease   . Vitamin D deficiency    Past Surgical History:  Procedure Laterality Date  . CARDIAC CATHETERIZATION  ~ 2008  . CARDIOVERSION N/A 08/05/2017   Procedure: CARDIOVERSION;  Surgeon: Evans Lance, MD;  Location: [facility];  Service: Cardiovascular;  Laterality: N/A;  . [place];  ~ 1978/1979; 1984  . ESOPHAGOMYOTOMY  2009  . GYNECOLOGIC CRYOSURGERY  X 2   "stage 1 cancer"  . HERNIA REPAIR  X 6   "all in my stomach" (07/27/2017)  . KNEE ARTHROSCOPY Right   . NEPHRECTOMY Right 1991   in [place], due to fibrosis and lithiasis  . TUBAL LIGATION      Current Outpatient Medications  Medication Sig Dispense Refill  . allopurinol (ZYLOPRIM) 100 MG tablet Take 1 tablet (100 mg total) by mouth daily. 90 tablet 2  . atorvastatin (LIPITOR) 40 MG tablet Take 1 tablet (40 mg total) by mouth at bedtime. 90 tablet 2  . Cholecalciferol (VITAMIN D3) 5000 UNITS CAPS Take 1 capsule by mouth daily.     . clobetasol cream (TEMOVATE) 3.41 % Apply 1 application topically 2 (two) times daily. Apply twice a week first week then once weekly as needed 30 g 1  . cyclobenzaprine (FLEXERIL) 10 MG tablet Take 1 tablet (10 mg total) by mouth 2 (two) times daily as needed for muscle spasms. 40 tablet 2  . DULoxetine (CYMBALTA) 60 MG capsule Take 1 capsule (60 mg total) daily by mouth. 90 capsule 1  . furosemide (LASIX) 20 MG tablet Take 1 tablet (20 mg total) by mouth daily. 30 tablet 3  . levothyroxine (SYNTHROID, LEVOTHROID) 125 MCG tablet Take 1 tablet (125 mcg total) by mouth daily before breakfast. 90 tablet  0  . metFORMIN (GLUCOPHAGE) 500 MG tablet Take 1 tablet (500 mg total) by mouth 2 (two) times daily with a meal. 180 tablet 2  . metoprolol succinate (TOPROL-XL) 50 MG 24 hr tablet Take 1 table by mouth twice a day. Take with or immediately following a meal. 30 tablet 0  . omeprazole (PRILOSEC) 20 MG capsule Take 1 capsule (20 mg total) by mouth daily. 90 capsule 2  . raloxifene (EVISTA) 60 MG tablet Take 1 tablet (60 mg total) by mouth daily. 30 tablet 11  . rivaroxaban (XARELTO) 20 MG TABS tablet Take 1 tablet (20 mg total) daily with supper by mouth. 30 tablet 1  . sotalol (BETAPACE) 120 MG tablet Take 1 tablet (120 mg total) by mouth every 12 (twelve) hours. 60 tablet 0  . zolpidem  (AMBIEN) 5 MG tablet Take 1 tablet (5 mg total) by mouth at bedtime as needed for sleep. 90 tablet 2  . albuterol (VENTOLIN HFA) 108 (90 Base) MCG/ACT inhaler Inhale 2 puffs into the lungs every 6 (six) hours as needed for wheezing or shortness of breath. (Patient not taking: Reported on 08/16/2017) 1 Inhaler 1   No current facility-administered medications for this encounter.     Allergies  Allergen Reactions  . Penicillins Swelling and Rash    Has patient had a PCN reaction causing immediate rash, facial/tongue/throat swelling, SOB or lightheadedness with hypotension: No Has patient had a PCN reaction causing severe rash involving mucus membranes or skin necrosis: No Has patient had a PCN reaction that required hospitalization: No Has patient had a PCN reaction occurring within the last 10 years: No If all of the above answers are "NO", then may proceed with Cephalosporin use.    Social History   Socioeconomic History  . Marital status: Married    Spouse name: Not on file  . Number of children: 3  . Years of education: Not on file  . Highest education level: Not on file  Social Needs  . Financial resource strain: Not on file  . Food insecurity - worry: Not on file  . Food insecurity - inability: Not on file  . Transportation needs - medical: Not on file  . Transportation needs - non-medical: Not on file  Occupational History  . 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 Smoking status: Former Smoker    Packs/day: 2.00    Years: 28.00    Pack years: 56.00    Types: Cigarettes    Last attempt to quit: 2000    Years since quitting: 19.0  . Smokeless tobacco: Never Used  Substance and Sexual Activity  . Alcohol use: Yes    Comment: 07/27/2017 "3-4 drinks//month"  . Drug use: No  . Sexual activity: No    Comment: 1ST INTERCOURSE- 72, PARTNERS - 2  Other Topics Concern  . Not on file  Social History Narrative   Original from [place]   3 children, 2 alive   Household --pt  and husband     Family History  Problem Relation Age of Onset  . Breast cancer Other        aunt   . CAD Brother   . Lung cancer Mother        F and M  . Diabetes Father        F and mother   . CAD Father   . Lung cancer Father   . Stroke Sister   . Colon cancer Neg Hx  ROS- All systems are reviewed and negative except as per the HPI above  Physical Exam: Vitals:   08/16/17 1138  BP: 112/64  Pulse: (!) 117  SpO2: 98%  Weight: 209 lb (94.8 kg)  Height: 5' 3.25" (1.607 m)   Wt Readings from Last 3 Encounters:  08/16/17 209 lb (94.8 kg)  08/09/17 207 lb (93.9 kg)  08/06/17 203 lb (92.1 kg)    Labs: Lab Results  Component Value Date   NA 141 08/06/2017   K 4.1 08/06/2017   CL 105 08/06/2017   CO2 28 08/06/2017   GLUCOSE 94 08/06/2017   BUN 15 08/06/2017   CREATININE 1.03 (H) 08/06/2017   CALCIUM 8.9 08/06/2017   MG 1.7 08/06/2017   Lab Results  Component Value Date   INR 1.1 (H) 06/29/2017   Lab Results  Component Value Date   CHOL 176 01/06/2017   HDL 81.50 01/06/2017   LDLCALC 74 01/06/2017   TRIG 100.0 01/06/2017     GEN- The patient is well appearing, alert and oriented x 3 today.   Head- normocephalic, atraumatic Eyes-  Sclera clear, conjunctiva pink Ears- hearing intact Oropharynx- clear Neck- supple, no JVP Lymph- no cervical lymphadenopathy Lungs- Clear to ausculation bilaterally, normal work of breathing Heart- irregular rate and rhythm, no murmurs, rubs or gallops, PMI not laterally displaced GI- soft, NT, ND, + BS Extremities- no clubbing, cyanosis, or edema MS- no significant deformity or atrophy Skin- no rash or lesion Psych- euthymic mood, full affect Neuro- strength and sensation are intact  EKG- afib at 117 bpm, qrs int 86 ms, qtc 463 ms Epic records reviewed    Assessment and Plan: 1. Persistent afib Recently loaded on sotalol 120 mg bid with successful cardioversion but with ERAF Discussed with Dr. Rayann Heman and will  plan for repeat cardioversion and if ERAF, may be an ablation candidate Pt/husband is in agreement Continue sotalol 120 mg bid  Continue xarelto 20 mg daily, states no  missed doses Cbc, bmet,mag  F/u with Dr. Rayann Heman 1/24  Geroge Baseman. Kyion Gautier, [facility] [address] [place], [place] [id number] [phone]

## 2017-08-16 NOTE — Patient Instructions (Signed)
Cardioversion scheduled for Thursday, January 10th  - Arrive at the [facility] and go to admitting at 12:30pm  -Do not eat or drink anything after midnight the night prior to your procedure.  - Take all your medication with a sip of water prior to arrival.  - You will not be able to drive home after your procedure.

## 2017-08-23 ENCOUNTER — Other Ambulatory Visit: Payer: Self-pay | Admitting: Internal Medicine

## 2017-08-24 ENCOUNTER — Encounter (HOSPITAL_COMMUNITY): Payer: Self-pay | Admitting: *Deleted

## 2017-08-24 ENCOUNTER — Ambulatory Visit (HOSPITAL_COMMUNITY): Payer: Federal, State, Local not specified - PPO | Admitting: Anesthesiology

## 2017-08-24 ENCOUNTER — Ambulatory Visit (HOSPITAL_COMMUNITY)
Admission: RE | Admit: 2017-08-24 | Discharge: 2017-08-24 | Disposition: A | Discharge status: Home / Self Care | Payer: Federal, State, Local not specified - PPO | Source: Ambulatory Visit | Attending: Cardiovascular Disease | Admitting: Cardiovascular Disease

## 2017-08-24 ENCOUNTER — Encounter (HOSPITAL_COMMUNITY)
Admission: RE | Disposition: A | Discharge status: Home / Self Care | Payer: Self-pay | Source: Ambulatory Visit | Attending: Cardiovascular Disease

## 2017-08-24 DIAGNOSIS — E119 Type 2 diabetes mellitus without complications: Secondary | ICD-10-CM | POA: Insufficient documentation

## 2017-08-24 DIAGNOSIS — F319 Bipolar disorder, unspecified: Secondary | ICD-10-CM | POA: Diagnosis not present

## 2017-08-24 DIAGNOSIS — I10 Essential (primary) hypertension: Secondary | ICD-10-CM | POA: Insufficient documentation

## 2017-08-24 DIAGNOSIS — Z880 Allergy status to penicillin: Secondary | ICD-10-CM | POA: Diagnosis not present

## 2017-08-24 DIAGNOSIS — Z87891 Personal history of nicotine dependence: Secondary | ICD-10-CM | POA: Diagnosis not present

## 2017-08-24 DIAGNOSIS — J45909 Unspecified asthma, uncomplicated: Secondary | ICD-10-CM | POA: Insufficient documentation

## 2017-08-24 DIAGNOSIS — I25119 Atherosclerotic heart disease of native coronary artery with unspecified angina pectoris: Secondary | ICD-10-CM | POA: Diagnosis not present

## 2017-08-24 DIAGNOSIS — Z794 Long term (current) use of insulin: Secondary | ICD-10-CM | POA: Diagnosis not present

## 2017-08-24 DIAGNOSIS — I4891 Unspecified atrial fibrillation: Secondary | ICD-10-CM | POA: Diagnosis not present

## 2017-08-24 DIAGNOSIS — I4819 Other persistent atrial fibrillation: Secondary | ICD-10-CM

## 2017-08-24 DIAGNOSIS — Z79899 Other long term (current) drug therapy: Secondary | ICD-10-CM | POA: Insufficient documentation

## 2017-08-24 DIAGNOSIS — I110 Hypertensive heart disease with heart failure: Secondary | ICD-10-CM | POA: Diagnosis not present

## 2017-08-24 DIAGNOSIS — I2510 Atherosclerotic heart disease of native coronary artery without angina pectoris: Secondary | ICD-10-CM | POA: Diagnosis not present

## 2017-08-24 DIAGNOSIS — E039 Hypothyroidism, unspecified: Secondary | ICD-10-CM | POA: Diagnosis not present

## 2017-08-24 DIAGNOSIS — I5021 Acute systolic (congestive) heart failure: Secondary | ICD-10-CM | POA: Diagnosis not present

## 2017-08-24 DIAGNOSIS — Z955 Presence of coronary angioplasty implant and graft: Secondary | ICD-10-CM | POA: Diagnosis not present

## 2017-08-24 DIAGNOSIS — I481 Persistent atrial fibrillation: Secondary | ICD-10-CM | POA: Insufficient documentation

## 2017-08-24 HISTORY — PX: CARDIOVERSION: SHX1299

## 2017-08-24 SURGERY — CARDIOVERSION
Anesthesia: General

## 2017-08-24 MED ORDER — LIDOCAINE HCL (CARDIAC) 20 MG/ML IV SOLN
INTRAVENOUS | Status: DC | PRN
  Administered 2017-08-24: 100 mg via INTRATRACHEAL

## 2017-08-24 MED ORDER — SODIUM CHLORIDE 0.9 % IV SOLN
INTRAVENOUS | Status: DC
  Administered 2017-08-24: 13:00:00 via INTRAVENOUS

## 2017-08-24 MED ORDER — PROPOFOL 10 MG/ML IV BOLUS
INTRAVENOUS | Status: DC | PRN
  Administered 2017-08-24: 60 mg via INTRAVENOUS

## 2017-08-24 NOTE — Anesthesia Postprocedure Evaluation (Signed)
Anesthesia Post Note  Patient: Melinda Mckinney  Procedure(s) Performed: CARDIOVERSION (N/A )     Patient location during evaluation: Endoscopy Anesthesia Type: General Level of consciousness: awake and alert Pain management: pain level controlled Vital Signs Assessment: post-procedure vital signs reviewed and stable Respiratory status: spontaneous breathing, nonlabored ventilation, respiratory function stable and patient connected to nasal cannula oxygen Cardiovascular status: blood pressure returned to baseline and stable Postop Assessment: no apparent nausea or vomiting Anesthetic complications: no    Last Vitals:  Vitals:   08/24/17 1400 08/24/17 1403  BP: 101/65 (!) 106/54  Pulse: (!) 59 60  Resp: 15 16  Temp:  36.8 C  SpO2: 99% 99%    Last Pain:  Vitals:   08/24/17 1403  TempSrc: Oral                 Montez Hageman

## 2017-08-24 NOTE — Discharge Instructions (Signed)
Cardioversin elctrica, cuidados posteriores  (Electrical Cardioversion, Care After)  Siga estas instrucciones durante las prximas semanas. Estas indicaciones le proporcionan informacin general acerca de cmo deber cuidarse despus del procedimiento. El mdico tambin podr darle instrucciones ms especficas. El tratamiento ha sido planificado segn las prcticas mdicas actuales, pero en algunos casos pueden ocurrir problemas. Comunquese con el mdico si tiene algn problema o tiene dudas despus del procedimiento.  QU ESPERAR DESPUS DEL PROCEDIMIENTO  Despus del procedimiento, es normal tener:   Cierto enrojecimiento en la piel en el lugar en que se enviaron las descargas. Si siente dolor, podr aplicarse una locin para las quemaduras de sol o hidrocortisona.   Posible retorno de un ritmo cardaco anormal luego de algunas horas o das despus del procedimiento.  INSTRUCCIONES PARA EL CUIDADO EN EL HOGAR   Tome los medicamentos solamente como se lo haya indicado el mdico. Asegrese de que comprende cmo y cundo puede tomar sus medicamentos.   Aprenda cmo sentir su pulso y a controlarlo.   Limite su actividad durante 48horas despus del procedimiento o segn las indicaciones del mdico.   Evite o minimice la cafena y otros estimulantes segn las indicaciones del mdico.  SOLICITE ATENCIN MDICA SI:   Siente que el corazn late muy rpido o el pulso no es regular.   Tiene dudas relacionadas con los medicamentos.   Tiene una hemorragia que no se detiene.  SOLICITE ATENCIN MDICA DE INMEDIATO SI:   Se siente mareado o sufre un desmayo.   Le cuesta trabajo respirar o siente que le falta el aire.   Hay un cambio en las molestias en el pecho.   Arrastra las palabras al hablar o tiene dificultad para mover un brazo o una pierna de un lado del cuerpo.   Tiene un intenso calambre muscular que no se calma.   Sus dedos o la mano estn fros o de color azul.  Esta informacin no tiene como fin  reemplazar el consejo del mdico. Asegrese de hacerle al mdico cualquier pregunta que tenga.  Document Released: 05/22/2013 Document Revised: 08/22/2014 Document Reviewed: 02/05/2016  Elsevier Interactive Patient Education  2018 [employer]

## 2017-08-24 NOTE — Interval H&P Note (Signed)
History and Physical Interval Note:  08/24/2017 12:22 PM  Melinda Mckinney  has presented today for surgery, with the diagnosis of afib  The various methods of treatment have been discussed with the patient and family. After consideration of risks, benefits and other options for treatment, the patient has consented to  Procedure(s): CARDIOVERSION (N/A) as a surgical intervention .  The patient's history has been reviewed, patient examined, no change in status, stable for surgery.  I have reviewed the patient's chart and labs.  Questions were answered to the patient's satisfaction.     Marcelle Bebout

## 2017-08-24 NOTE — Transfer of Care (Signed)
Immediate Anesthesia Transfer of Care Note  Patient: Melinda Mckinney  Procedure(s) Performed: CARDIOVERSION (N/A )  Patient Location: Endoscopy Unit  Anesthesia Type:General  Level of Consciousness: awake, alert  and oriented  Airway & Oxygen Therapy: Patient Spontanous Breathing and Patient connected to nasal cannula oxygen  Post-op Assessment: Report given to RN and Post -op Vital signs reviewed and stable  Post vital signs: Reviewed and stable  Last Vitals:  Vitals:   08/24/17 1229  BP: 104/62  Pulse: (!) 130  Resp: 18  Temp: 36.8 C  SpO2: 96%    Last Pain:  Vitals:   08/24/17 1229  TempSrc: Oral         Complications: No apparent anesthesia complications

## 2017-08-24 NOTE — Op Note (Signed)
Procedure: Electrical Cardioversion Indications:  Atrial Fibrillation  Procedure Details:  Consent: Risks of procedure as well as the alternatives and risks of each were explained to the (patient/caregiver).  Consent for procedure obtained.  Time Out: Verified patient identification, verified procedure, site/side was marked, verified correct patient position, special equipment/implants available, medications/allergies/relevent history reviewed, required imaging and test results available.  Performed  Patient placed on cardiac monitor, pulse oximetry, supplemental oxygen as necessary.  Sedation given: Dr. Marcell Barlow, propofol 60 mg IV Pacer pads placed anterior and posterior chest.  Cardioverted 1 time(s).  Cardioversion with synchronized biphasic 120J shock.  Evaluation: Findings: Post procedure EKG shows: NSR Complications: None Patient did tolerate procedure well.  Time Spent Directly with the Patient:  30 minutes   Melinda Mckinney 08/24/2017, 1:46 PM

## 2017-08-24 NOTE — Anesthesia Preprocedure Evaluation (Signed)
Anesthesia Evaluation  Patient identified by MRN, date of birth, ID band Patient awake    Reviewed: Allergy & Precautions, NPO status , Patient's Chart, lab work & pertinent test results  Airway Mallampati: II  TM Distance: >3 FB Neck ROM: Full    Dental no notable dental hx.    Pulmonary asthma , former smoker,    Pulmonary exam normal breath sounds clear to auscultation       Cardiovascular hypertension, Pt. on medications + angina + CAD and + Cardiac Stents  Normal cardiovascular exam+ dysrhythmias Atrial Fibrillation  Rhythm:Regular Rate:Normal     Neuro/Psych Anxiety Depression negative neurological ROS     GI/Hepatic negative GI ROS, Neg liver ROS,   Endo/Other  diabetesHypothyroidism   Renal/GU negative Renal ROS  negative genitourinary   Musculoskeletal negative musculoskeletal ROS (+)   Abdominal   Peds negative pediatric ROS (+)  Hematology negative hematology ROS (+)   Anesthesia Other Findings   Reproductive/Obstetrics negative OB ROS                             Anesthesia Physical Anesthesia Plan  ASA: III  Anesthesia Plan: General   Post-op Pain Management:    Induction:   PONV Risk Score and Plan: 3 and Treatment may vary due to age or medical condition  Airway Management Planned: Mask  Additional Equipment:   Intra-op Plan:   Post-operative Plan:   Informed Consent: I have reviewed the patients History and Physical, chart, labs and discussed the procedure including the risks, benefits and alternatives for the proposed anesthesia with the patient or authorized representative who has indicated his/her understanding and acceptance.   Dental advisory given  Plan Discussed with:   Anesthesia Plan Comments:         Anesthesia Quick Evaluation

## 2017-08-25 ENCOUNTER — Encounter (HOSPITAL_COMMUNITY): Payer: Self-pay | Admitting: Cardiovascular Disease

## 2017-08-28 ENCOUNTER — Other Ambulatory Visit: Payer: Self-pay | Admitting: Internal Medicine

## 2017-08-28 NOTE — Telephone Encounter (Signed)
Okay 30 and 1 refill.

## 2017-08-28 NOTE — Telephone Encounter (Signed)
Pt is requesting refill on Xarelto 20mg . Please advise.

## 2017-08-28 NOTE — Telephone Encounter (Signed)
Rx sent 

## 2017-09-06 DIAGNOSIS — K080 Exfoliation of teeth due to systemic causes: Secondary | ICD-10-CM | POA: Diagnosis not present

## 2017-09-07 ENCOUNTER — Encounter: Payer: Self-pay | Admitting: Internal Medicine

## 2017-09-07 ENCOUNTER — Ambulatory Visit (INDEPENDENT_AMBULATORY_CARE_PROVIDER_SITE_OTHER): Payer: Federal, State, Local not specified - PPO | Admitting: Internal Medicine

## 2017-09-07 VITALS — BP 126/74 | HR 76 | Ht 63.25 in | Wt 209.0 lb

## 2017-09-07 DIAGNOSIS — I10 Essential (primary) hypertension: Secondary | ICD-10-CM

## 2017-09-07 DIAGNOSIS — I481 Persistent atrial fibrillation: Secondary | ICD-10-CM

## 2017-09-07 DIAGNOSIS — R5383 Other fatigue: Secondary | ICD-10-CM | POA: Diagnosis not present

## 2017-09-07 DIAGNOSIS — Z955 Presence of coronary angioplasty implant and graft: Secondary | ICD-10-CM

## 2017-09-07 DIAGNOSIS — I4819 Other persistent atrial fibrillation: Secondary | ICD-10-CM

## 2017-09-07 MED ORDER — SOTALOL HCL 120 MG PO TABS: 120.0000 mg | ORAL_TABLET | Freq: Two times a day (BID) | ORAL | 2 refills | Status: DC

## 2017-09-07 NOTE — Progress Notes (Signed)
PCP: Colon Branch, MD   Primary EP: Dr Rayann Heman  Melinda Mckinney is a 62 y.o. female who presents today for routine electrophysiology followup.  Since last being seen in our clinic, the patient reports doing very well.  She had ERAF and underwent cardioversion 08/24/16.  She continues to have fatigue of unclear etiology.  Her legs are tired.  Today, she denies symptoms of palpitations, chest pain, shortness of breath,  lower extremity edema, dizziness, presyncope, or syncope.  The patient is otherwise without complaint today.   Past Medical History:  Diagnosis Date  . Abnormal Pap smear of cervix   . Anemia   . Anxiety and depression   . Asthma   . Atrial fibrillation ([place]) 07/2017  . Borderline diabetes   . CAD (coronary artery disease) ~2009   stent   . Cervical cancer ([place])    "stage 1; had cryotherapy"  . Cervical dysplasia   . Chronic lower back pain   . DDD (degenerative disc disease), lumbar    Dr. Nelva Bush, recommends lumbar epidural steroid injections L5-S1 to the right  . Depression   . Fibromyalgia   . GERD (gastroesophageal reflux disease)   . Headache    "only when I'm short of breath"  (07/27/2017)  . High cholesterol   . History of hiatal hernia   . History of kidney stones   . Hypertension   . Hypothyroidism   . Insomnia 09/27/2013  . Lichen sclerosus    Vulvar area  . Osteoporosis   . Thyroid disease   . Vitamin D deficiency    Past Surgical History:  Procedure Laterality Date  . CARDIAC CATHETERIZATION  ~ 2008  . CARDIOVERSION N/A 08/05/2017   Procedure: CARDIOVERSION;  Surgeon: Evans Lance, MD;  Location: [facility];  Service: Cardiovascular;  Laterality: N/A;  . CARDIOVERSION N/A 08/24/2017   Procedure: CARDIOVERSION;  Surgeon: Sanda Klein, MD;  Location: [facility];  Service: Cardiovascular;  Laterality: N/A;  . [place]; ~ 1978/1979; 1984  . ESOPHAGOMYOTOMY  2009  . GYNECOLOGIC CRYOSURGERY  X 2   "stage 1 cancer"  . HERNIA REPAIR  X 6    "all in my stomach" (07/27/2017)  . KNEE ARTHROSCOPY Right   . NEPHRECTOMY Right 1991   in [place], due to fibrosis and lithiasis  . TUBAL LIGATION      ROS- all systems are reviewed and negatives except as per HPI above  Current Outpatient Medications  Medication Sig Dispense Refill  . albuterol (VENTOLIN HFA) 108 (90 Base) MCG/ACT inhaler Inhale 2 puffs into the lungs every 6 (six) hours as needed for wheezing or shortness of breath. 1 Inhaler 1  . allopurinol (ZYLOPRIM) 100 MG tablet Take 1 tablet (100 mg total) by mouth daily. 90 tablet 2  . atorvastatin (LIPITOR) 40 MG tablet Take 1 tablet (40 mg total) by mouth at bedtime. 90 tablet 2  . Cholecalciferol (VITAMIN D3) 5000 UNITS CAPS Take 1 capsule by mouth daily.     . clobetasol cream (TEMOVATE) 3.22 % Apply 1 application topically 2 (two) times daily. Apply twice a week first week then once weekly as needed 30 g 1  . cyclobenzaprine (FLEXERIL) 10 MG tablet Take 1 tablet (10 mg total) by mouth 2 (two) times daily as needed for muscle spasms. 40 tablet 2  . DULoxetine (CYMBALTA) 60 MG capsule Take 1 capsule (60 mg total) daily by mouth. 90 capsule 1  . furosemide (LASIX) 20 MG tablet Take 1  tablet (20 mg total) by mouth daily. 30 tablet 3  . levothyroxine (SYNTHROID, LEVOTHROID) 125 MCG tablet Take 1 tablet (125 mcg total) by mouth daily before breakfast. 90 tablet 1  . metFORMIN (GLUCOPHAGE) 500 MG tablet Take 1 tablet (500 mg total) by mouth 2 (two) times daily with a meal. 180 tablet 2  . metoprolol succinate (TOPROL-XL) 50 MG 24 hr tablet Take 1 table by mouth twice a day. Take with or immediately following a meal. 30 tablet 0  . omeprazole (PRILOSEC) 20 MG capsule Take 1 capsule (20 mg total) by mouth daily. 90 capsule 2  . raloxifene (EVISTA) 60 MG tablet Take 1 tablet (60 mg total) by mouth daily. 30 tablet 11  . rivaroxaban (XARELTO) 20 MG TABS tablet Take 1 tablet (20 mg total) by mouth daily with supper. 30 tablet 1  .  sotalol (BETAPACE) 120 MG tablet Take 1 tablet (120 mg total) by mouth every 12 (twelve) hours. 60 tablet 0  . zolpidem (AMBIEN) 5 MG tablet Take 1 tablet (5 mg total) by mouth at bedtime as needed for sleep. 90 tablet 2   No current facility-administered medications for this visit.     Physical Exam: Vitals:   09/07/17 1209  BP: 126/74  Pulse: 76  Weight: 209 lb (94.8 kg)  Height: 5' 3.25" (1.607 m)    GEN- The patient is well appearing, alert and oriented x 3 today.   Head- normocephalic, atraumatic Eyes-  Sclera clear, conjunctiva pink Ears- hearing intact Oropharynx- clear Lungs- Clear to ausculation bilaterally, normal work of breathing Heart- Regular rate and rhythm, no murmurs, rubs or gallops, PMI not laterally displaced GI- soft, NT, ND, + BS Extremities- no clubbing, cyanosis, or edema  EKG tracing ordered today is personally reviewed and shows sinus rhythm 75 bpm, Qtc 407 msec  Assessment and Plan:  1. Persistent afib Currently in sinus with sotalol Stop toprol given fatigue If she continues to have afib, we should consider ablation. Continue on xarelto for chads2vasc score of at least 2  2. HTN Stable No change required today Stop toprol as above  3. CAD No ischemic symptoms  4. Fatigue Unclear etiology Stop toprol Consider checking TSH and sleep study if still present upon follow-up in AF clinic in 6 weeks I will see in 3 months   Thompson Grayer MD, [facility] 09/07/2017 1:01 PM

## 2017-09-07 NOTE — Patient Instructions (Addendum)
Medication Instructions:  Your physician has recommended you make the following change in your medication:   Stop Toprol  Labwork: None ordered.  Testing/Procedures: None ordered.  Follow-Up:  Your physician recommends that you schedule a follow-up appointment with Roderic Palau, NP in the Afib clinic in 6 weeks; 3 months with Dr Rayann Heman.   Any Other Special Instructions Will Be Listed Below (If Applicable).     If you need a refill on your cardiac medications before your next appointment, please call your pharmacy.

## 2017-09-15 LAB — HM MAMMOGRAPHY: HM MAMMO: NORMAL (ref 0–4)

## 2017-09-18 DIAGNOSIS — Z1231 Encounter for screening mammogram for malignant neoplasm of breast: Secondary | ICD-10-CM | POA: Diagnosis not present

## 2017-09-21 ENCOUNTER — Ambulatory Visit (INDEPENDENT_AMBULATORY_CARE_PROVIDER_SITE_OTHER): Payer: Federal, State, Local not specified - PPO | Admitting: Cardiovascular Disease

## 2017-09-21 ENCOUNTER — Encounter: Payer: Self-pay | Admitting: Cardiovascular Disease

## 2017-09-21 VITALS — BP 100/64 | HR 66 | Ht 64.0 in | Wt 209.0 lb

## 2017-09-21 DIAGNOSIS — Z955 Presence of coronary angioplasty implant and graft: Secondary | ICD-10-CM

## 2017-09-21 DIAGNOSIS — E7800 Pure hypercholesterolemia, unspecified: Secondary | ICD-10-CM

## 2017-09-21 DIAGNOSIS — I481 Persistent atrial fibrillation: Secondary | ICD-10-CM | POA: Diagnosis not present

## 2017-09-21 DIAGNOSIS — I4819 Other persistent atrial fibrillation: Secondary | ICD-10-CM

## 2017-09-21 NOTE — Progress Notes (Signed)
Cardiology Office Note   Date:  09/21/2017   ID:  Taylee Gunnells, DOB 1956/03/08, MRN [id number]  PCP:  Colon Branch, MD  Cardiologist:   Skeet Latch, MD   Chief Complaint  Patient presents with  . Follow-up    no main concerns today     History of Present Illness: Kayela Humphres is a 62 y.o. Spanish-speaking female with CAD s/p PCI, DM, hypertension, hyperlipidemia, and fibromyalgia here for follow-up.  She was initially seen 06/2017 for new onset atrial fibrillation.  This was noted by her PCP, Dr. Larose Kells.  Ms. Cousineau noted that she had been taking weight loss pills that contain phentermine for 3 months.  She saw Dr. Larose Kells due to not feeling well and was found to be in atrial fibrillation with a ventricular rate of 157.  She was started on metoprolol and Xarelto.  Her lisinopril was held.  At her follow-up appointment she was persistently tachycardic so metoprolol was increased.  She was also referred for sleep study that has not yet been completed.  We discussed the importance of limiting her caffeine intake.  She has a coronary stent but denies MI.  She wanted to undergo plastic surgery in 2010.  She was referred for a stress test that was abnormal and subsequently underwent LHC and PCI.    Since her last appointment Ms. Erich was admitted for sotalol load.  She subsequently went back into atrial fibrillation and required cardioversion again on 08/24/17.  She saw Dr. Rayann Heman and will be considered for ablation if she has recurrent atrial fibrillation.  Metoprolol was stopped 2/2 fatigue.  Since stopping metoprolol she is feeling much better.  She no longer feels tired throughout the day.  She has not experienced any palpitations.  She denies chest pain or pressure.  She does get short of breath when walking upstairs.  She feels as though she can not catch her breath when she gets to the top and her heart starts pounding.  She would like to get a stair lift installed in her home because she reports  that Dr. Rayann Heman told her she should not get to this level of exhaustion or it may lead to recurrent atrial fibrillation..  She is no longer has edema or orthopnea.  She notes that her appetite has been limited.  However she is not worried about that because she wants to lose weight.  She has not been getting much exercise.  She wants to start swimming in the pool.  She is limited by pain and swelling in her right knee.  She previously had surgery in that knee.   Past Medical History:  Diagnosis Date  . Abnormal Pap smear of cervix   . Anemia   . Anxiety and depression   . Asthma   . Atrial fibrillation ([place]) 07/2017  . Borderline diabetes   . CAD (coronary artery disease) ~2009   stent   . Cervical cancer ([place])    "stage 1; had cryotherapy"  . Cervical dysplasia   . Chronic lower back pain   . DDD (degenerative disc disease), lumbar    Dr. Nelva Bush, recommends lumbar epidural steroid injections L5-S1 to the right  . Depression   . Fibromyalgia   . GERD (gastroesophageal reflux disease)   . Headache    "only when I'm short of breath"  (07/27/2017)  . High cholesterol   . History of hiatal hernia   . History of kidney stones   . Hypertension   .  Hypothyroidism   . Insomnia 09/27/2013  . Lichen sclerosus    Vulvar area  . Osteoporosis   . Thyroid disease   . Vitamin D deficiency     Past Surgical History:  Procedure Laterality Date  . CARDIAC CATHETERIZATION  ~ 2008  . CARDIOVERSION N/A 08/05/2017   Procedure: CARDIOVERSION;  Surgeon: Evans Lance, MD;  Location: [facility];  Service: Cardiovascular;  Laterality: N/A;  . CARDIOVERSION N/A 08/24/2017   Procedure: CARDIOVERSION;  Surgeon: Sanda Klein, MD;  Location: [facility];  Service: Cardiovascular;  Laterality: N/A;  . [place]; ~ 1978/1979; 1984  . ESOPHAGOMYOTOMY  2009  . GYNECOLOGIC CRYOSURGERY  X 2   "stage 1 cancer"  . HERNIA REPAIR  X 6   "all in my stomach" (07/27/2017)  . KNEE ARTHROSCOPY Right     . NEPHRECTOMY Right 1991   in [place], due to fibrosis and lithiasis  . TUBAL LIGATION       Current Outpatient Medications  Medication Sig Dispense Refill  . albuterol (VENTOLIN HFA) 108 (90 Base) MCG/ACT inhaler Inhale 2 puffs into the lungs every 6 (six) hours as needed for wheezing or shortness of breath. 1 Inhaler 1  . allopurinol (ZYLOPRIM) 100 MG tablet Take 1 tablet (100 mg total) by mouth daily. 90 tablet 2  . atorvastatin (LIPITOR) 40 MG tablet Take 1 tablet (40 mg total) by mouth at bedtime. 90 tablet 2  . Cholecalciferol (VITAMIN D3) 5000 UNITS CAPS Take 1 capsule by mouth daily.     . clobetasol cream (TEMOVATE) 1.95 % Apply 1 application topically 2 (two) times daily. Apply twice a week first week then once weekly as needed 30 g 1  . cyclobenzaprine (FLEXERIL) 10 MG tablet Take 1 tablet (10 mg total) by mouth 2 (two) times daily as needed for muscle spasms. 40 tablet 2  . DULoxetine (CYMBALTA) 60 MG capsule Take 1 capsule (60 mg total) daily by mouth. 90 capsule 1  . furosemide (LASIX) 20 MG tablet Take 1 tablet (20 mg total) by mouth daily. 30 tablet 3  . levothyroxine (SYNTHROID, LEVOTHROID) 125 MCG tablet Take 1 tablet (125 mcg total) by mouth daily before breakfast. 90 tablet 1  . metFORMIN (GLUCOPHAGE) 500 MG tablet Take 1 tablet (500 mg total) by mouth 2 (two) times daily with a meal. 180 tablet 2  . omeprazole (PRILOSEC) 20 MG capsule Take 1 capsule (20 mg total) by mouth daily. 90 capsule 2  . raloxifene (EVISTA) 60 MG tablet Take 1 tablet (60 mg total) by mouth daily. 30 tablet 11  . rivaroxaban (XARELTO) 20 MG TABS tablet Take 1 tablet (20 mg total) by mouth daily with supper. 30 tablet 1  . sotalol (BETAPACE) 120 MG tablet Take 1 tablet (120 mg total) by mouth every 12 (twelve) hours. 60 tablet 2  . zolpidem (AMBIEN) 5 MG tablet Take 1 tablet (5 mg total) by mouth at bedtime as needed for sleep. 90 tablet 2   No current facility-administered medications for this  visit.     Allergies:   Penicillins    Social History:  The patient  reports that she quit smoking about 19 years ago. Her smoking use included cigarettes. She has a 56.00 pack-year smoking history. she has never used smokeless tobacco. She reports that she drinks alcohol. She reports that she does not use drugs.   Family History:  The patient's family history includes Breast cancer in her other; CAD in her brother and father;  Diabetes in her father; Lung cancer in her father and mother; Stroke in her sister.    ROS:  Please see the history of present illness.   Otherwise, review of systems are positive for none.   All other systems are reviewed and negative.    PHYSICAL EXAM: VS:  BP 100/64   Pulse 66   Ht 5\' 4"  (1.626 m)   Wt 209 lb (94.8 kg)   BMI 35.87 kg/m  , BMI Body mass index is 35.87 kg/m. GENERAL:  Well appearing HEENT: Pupils equal round and reactive, fundi not visualized, oral mucosa unremarkable NECK:  No jugular venous distention, waveform within normal limits, carotid upstroke brisk and symmetric, no bruits, no thyromegaly LUNGS:  Clear to auscultation bilaterally HEART:  RRR.  PMI not displaced or sustained,S1 and S2 within normal limits, no S3, no S4, no clicks, no rubs, no murmurs ABD:  Flat, positive bowel sounds normal in frequency in pitch, no bruits, no rebound, no guarding, no midline pulsatile mass, no hepatomegaly, no splenomegaly EXT:  2 plus pulses throughout, no edema, no cyanosis no clubbing SKIN:  No rashes no nodules NEURO:  Cranial nerves II through XII grossly intact, motor grossly intact throughout PSYCH:  Cognitively intact, oriented to person place and time   EKG:  EKG is not ordered today. The ekg ordered 07/14/17 demonstrates atrial fibrillation.  Rate 111 bpm.    Recent Labs: 07/27/2017: ALT 20; B Natriuretic Peptide 415.5 07/28/2017: TSH 4.801 08/16/2017: BUN 15; Creatinine, Ser 0.89; Hemoglobin 11.4; Magnesium 1.9; Platelets 283;  Potassium 4.2; Sodium 140    Lipid Panel    Component Value Date/Time   CHOL 176 01/06/2017 1035   TRIG 100.0 01/06/2017 1035   HDL 81.50 01/06/2017 1035   CHOLHDL 2 01/06/2017 1035   VLDL 20.0 01/06/2017 1035   LDLCALC 74 01/06/2017 1035   LDLDIRECT 76.0 02/06/2015 0916      Wt Readings from Last 3 Encounters:  09/21/17 209 lb (94.8 kg)  09/07/17 209 lb (94.8 kg)  08/16/17 209 lb (94.8 kg)      ASSESSMENT AND PLAN:  # Persistent atrial fibrillation: Ms. Brandenburger is maintaining sinus rhythm on sotalol. She has required multiple cardioversions and her next step may be ablation. She is feeling much better since stopping metoprolol.  Continue Xarelto.  Per Dr. Rayann Heman she is not supposed to get exhausted or it may lead to her going back into atrial fibrillation.  She has difficulty ambulating her stairs and was requested a home stair lift.  We will provide a prescription today.  # Hyperlipidemia: LDL 74 12/2016.  Continue atorvastatin.    # Hypertension: BP controlled off antihypertensives.   # CAD: s/p coronary stent.  Not an active issue. No aspirin given that she is on Xarelto.  No beta blocker 2/2 fatigue.   Current medicines are reviewed at length with the patient today.  The patient does not have concerns regarding medicines.  The following changes have been made:  Increase metoprolol  Labs/ tests ordered today include:   No orders of the defined types were placed in this encounter.    Disposition:   FU with Slyvia Lartigue C. Oval Linsey, MD, [facility] in 6 months.    This note was written with the assistance of speech recognition software.  Please excuse any transcriptional errors.  Signed, Darnetta Kesselman C. Oval Linsey, MD, [facility]  09/21/2017 9:10 AM    [place]

## 2017-09-21 NOTE — Patient Instructions (Signed)
Medication Instructions:  Your physician recommends that you continue on your current medications as directed. Please refer to the Current Medication list given to you today.  Labwork: NONE  Testing/Procedures: NONE  Follow-Up: Your physician wants you to follow-up in: [place] will receive a reminder letter in the mail two months in advance. If you don't receive a letter, please call our office to schedule the follow-up appointment.  Any Other Special Instructions Will Be Listed Below (If Applicable). YOU HAVE BEEN GIVEN A PRESCRIPTION FOR A STAIR LIFT   If you need a refill on your cardiac medications before your next appointment, please call your pharmacy.

## 2017-09-26 DIAGNOSIS — R922 Inconclusive mammogram: Secondary | ICD-10-CM | POA: Diagnosis not present

## 2017-10-04 ENCOUNTER — Encounter: Payer: Self-pay | Admitting: Internal Medicine

## 2017-10-04 ENCOUNTER — Ambulatory Visit (INDEPENDENT_AMBULATORY_CARE_PROVIDER_SITE_OTHER): Payer: Federal, State, Local not specified - PPO | Admitting: Internal Medicine

## 2017-10-04 VITALS — BP 126/80 | HR 80 | Temp 98.2°F | Resp 14 | Ht 64.0 in | Wt 208.5 lb

## 2017-10-04 DIAGNOSIS — D649 Anemia, unspecified: Secondary | ICD-10-CM

## 2017-10-04 DIAGNOSIS — I480 Paroxysmal atrial fibrillation: Secondary | ICD-10-CM

## 2017-10-04 DIAGNOSIS — E039 Hypothyroidism, unspecified: Secondary | ICD-10-CM

## 2017-10-04 DIAGNOSIS — E785 Hyperlipidemia, unspecified: Secondary | ICD-10-CM | POA: Diagnosis not present

## 2017-10-04 LAB — LIPID PANEL
CHOL/HDL RATIO: 3
CHOLESTEROL: 173 mg/dL (ref 0–200)
HDL: 68.9 mg/dL (ref >39.00)
LDL CALC: 76 mg/dL (ref 0–99)
NonHDL: 103.97
TRIGLYCERIDES: 138 mg/dL (ref 0.0–149.0)
VLDL: 27.6 mg/dL (ref 0.0–40.0)

## 2017-10-04 LAB — ALT: ALT: 11 U/L (ref 0–35)

## 2017-10-04 LAB — CBC WITH DIFFERENTIAL/PLATELET
BASOS ABS: 0 10*3/uL (ref 0.0–0.1)
BASOS PCT: 0.8 % (ref 0.0–3.0)
EOS ABS: 0.1 10*3/uL (ref 0.0–0.7)
Eosinophils Relative: 2.2 % (ref 0.0–5.0)
HCT: 36.7 % (ref 36.0–46.0)
Hemoglobin: 12.1 g/dL (ref 12.0–15.0)
LYMPHS ABS: 1.6 10*3/uL (ref 0.7–4.0)
Lymphocytes Relative: 27 % (ref 12.0–46.0)
MCHC: 32.9 g/dL (ref 30.0–36.0)
MCV: 90.8 fl (ref 78.0–100.0)
Monocytes Absolute: 0.4 10*3/uL (ref 0.1–1.0)
Monocytes Relative: 5.8 % (ref 3.0–12.0)
NEUTROS ABS: 3.9 10*3/uL (ref 1.4–7.7)
NEUTROS PCT: 64.2 % (ref 43.0–77.0)
PLATELETS: 280 10*3/uL (ref 150.0–400.0)
RBC: 4.04 Mil/uL (ref 3.87–5.11)
RDW: 14.5 % (ref 11.5–15.5)
WBC: 6.1 10*3/uL (ref 4.0–10.5)

## 2017-10-04 LAB — AST: AST: 16 U/L (ref 0–37)

## 2017-10-04 LAB — FERRITIN: FERRITIN: 33 ng/mL (ref 10.0–291.0)

## 2017-10-04 LAB — TSH: TSH: 0.61 u[IU]/mL (ref 0.35–4.50)

## 2017-10-04 LAB — IRON: IRON: 125 ug/dL (ref 42–145)

## 2017-10-04 MED ORDER — ALLOPURINOL 100 MG PO TABS: 50.0000 mg | ORAL_TABLET | Freq: Every day | ORAL | 1 refills | Status: DC

## 2017-10-04 MED ORDER — CLOBETASOL PROPIONATE 0.05 % EX CREA: 1.0000 "application " | TOPICAL_CREAM | CUTANEOUS | 1 refills | Status: DC

## 2017-10-04 MED ORDER — CYCLOBENZAPRINE HCL 10 MG PO TABS: 10.0000 mg | ORAL_TABLET | Freq: Two times a day (BID) | ORAL | 1 refills | Status: DC | PRN

## 2017-10-04 MED ORDER — ZOLPIDEM TARTRATE 5 MG PO TABS: 5.0000 mg | ORAL_TABLET | Freq: Every evening | ORAL | 1 refills | Status: DC | PRN

## 2017-10-04 NOTE — Progress Notes (Signed)
Pre visit review using our clinic review tool, if applicable. No additional management support is needed unless otherwise documented below in the visit note. 

## 2017-10-04 NOTE — Progress Notes (Signed)
Subjective:    Patient ID: Melinda Mckinney, female    DOB: 1956-02-25, 62 y.o.   MRN: [id number]  DOS:  10/04/2017 Type of visit - description : Follow-up Interval history: Since the last office visit, saw cardiology for atrial fibrillation, chart reviewed. Hypothyroidism: Last TSH was slightly elevated, medication was not adjusted, reports good compliance with Synthroid. On allopurinol, reason?.  See below Anxiety depression: Symptoms controlled DM: Doing great with diet, has loss some weight.  Wt Readings from Last 3 Encounters:  10/04/17 208 lb 8 oz (94.6 kg)  09/21/17 209 lb (94.8 kg)  09/07/17 209 lb (94.8 kg)     Review of Systems Although she reports DOE with minimal exertion, overall she feels she is doing great. Beta-blockers were stopped due to DOE but that has not change consequently I am not sure if it was a side effect of metoprolol.  Denies chest pain, edema. No nausea, vomiting, blood in the stools. No cough No gross hematuria   Past Medical History:  Diagnosis Date  . Abnormal Pap smear of cervix   . Anemia   . Anxiety and depression   . Asthma   . Atrial fibrillation ([place]) 07/2017  . Borderline diabetes   . CAD (coronary artery disease) ~2009   stent   . Cervical cancer ([place])    "stage 1; had cryotherapy"  . Cervical dysplasia   . Chronic lower back pain   . DDD (degenerative disc disease), lumbar    Dr. Nelva Bush, recommends lumbar epidural steroid injections L5-S1 to the right  . Depression   . Fibromyalgia   . GERD (gastroesophageal reflux disease)   . Headache    "only when I'm short of breath"  (07/27/2017)  . High cholesterol   . History of hiatal hernia   . History of kidney stones   . Hypertension   . Hypothyroidism   . Insomnia 09/27/2013  . Lichen sclerosus    Vulvar area  . Osteoporosis   . Thyroid disease   . Vitamin D deficiency     Past Surgical History:  Procedure Laterality Date  . CARDIAC CATHETERIZATION  ~ 2008  .  CARDIOVERSION N/A 08/05/2017   Procedure: CARDIOVERSION;  Surgeon: Evans Lance, MD;  Location: [place];  Service: Cardiovascular;  Laterality: N/A;  . CARDIOVERSION N/A 08/24/2017   Procedure: CARDIOVERSION;  Surgeon: Sanda Klein, MD;  Location: [facility];  Service: Cardiovascular;  Laterality: N/A;  . [place]; ~ 1978/1979; 1984  . ESOPHAGOMYOTOMY  2009  . GYNECOLOGIC CRYOSURGERY  X 2   "stage 1 cancer"  . HERNIA REPAIR  X 6   "all in my stomach" (07/27/2017)  . KNEE ARTHROSCOPY Right   . NEPHRECTOMY Right 1991   in [place], due to fibrosis and lithiasis  . TUBAL LIGATION      Social History   Socioeconomic History  . Marital status: Married    Spouse name: Not on file  . Number of children: 3  . Years of education: Not on file  . Highest education level: Not on file  Social Needs  . Financial resource strain: Not on file  . Food insecurity - worry: Not on file  . Food insecurity - inability: Not on file  . Transportation needs - medical: Not on file  . Transportation needs - non-medical: Not on file  Occupational History  . 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 Smoking status: Former Smoker    Packs/day: 2.00  Years: 28.00    Pack years: 56.00    Types: Cigarettes    Last attempt to quit: 2000    Years since quitting: 19.1  . Smokeless tobacco: Never Used  Substance and Sexual Activity  . Alcohol use: Yes    Comment: 07/27/2017 "3-4 drinks//month"  . Drug use: No  . Sexual activity: No    Comment: 1ST INTERCOURSE- 85, PARTNERS - 2  Other Topics Concern  . Not on file  Social History Narrative   Original from [place]   3 children, 2 alive   Household --pt and husband       Allergies as of 10/04/2017      Reactions   Penicillins Swelling, Rash   Has patient had a PCN reaction causing immediate rash, facial/tongue/throat swelling, SOB or lightheadedness with hypotension: No Has patient had a PCN reaction causing severe rash  involving mucus membranes or skin necrosis: No Has patient had a PCN reaction that required hospitalization: No Has patient had a PCN reaction occurring within the last 10 years: No If all of the above answers are "NO", then may proceed with Cephalosporin use.      Medication List        Accurate as of 10/04/17  5:53 PM. Always use your most recent med list.          allopurinol 100 MG tablet Commonly known as:  ZYLOPRIM Take 0.5 tablets (50 mg total) by mouth daily.   atorvastatin 40 MG tablet Commonly known as:  LIPITOR Take 1 tablet (40 mg total) by mouth at bedtime.   clobetasol cream 0.05 % Commonly known as:  TEMOVATE Apply 1 application topically once a week.   cyclobenzaprine 10 MG tablet Commonly known as:  FLEXERIL Take 1 tablet (10 mg total) by mouth 2 (two) times daily as needed for muscle spasms.   DULoxetine 60 MG capsule Commonly known as:  CYMBALTA Take 1 capsule (60 mg total) daily by mouth.   furosemide 20 MG tablet Commonly known as:  LASIX Take 1 tablet (20 mg total) by mouth daily.   levothyroxine 125 MCG tablet Commonly known as:  SYNTHROID, LEVOTHROID Take 1 tablet (125 mcg total) by mouth daily before breakfast.   metFORMIN 500 MG tablet Commonly known as:  GLUCOPHAGE Take 1 tablet (500 mg total) by mouth 2 (two) times daily with a meal.   omeprazole 20 MG capsule Commonly known as:  PRILOSEC Take 1 capsule (20 mg total) by mouth daily.   raloxifene 60 MG tablet Commonly known as:  EVISTA Take 1 tablet (60 mg total) by mouth daily.   rivaroxaban 20 MG Tabs tablet Commonly known as:  XARELTO Take 1 tablet (20 mg total) by mouth daily with supper.   sotalol 120 MG tablet Commonly known as:  BETAPACE Take 1 tablet (120 mg total) by mouth every 12 (twelve) hours.   Vitamin D3 5000 units Caps Take 1 capsule by mouth daily.   zolpidem 5 MG tablet Commonly known as:  AMBIEN Take 1 tablet (5 mg total) by mouth at bedtime as needed for  sleep.          Objective:   Physical Exam BP 126/80 (BP Location: Left Arm, Patient Position: Sitting, Cuff Size: Normal)   Pulse 80   Temp 98.2 F (36.8 C) (Oral)   Resp 14   Ht 5\' 4"  (1.626 m)   Wt 208 lb 8 oz (94.6 kg)   SpO2 98%   BMI 35.79 kg/m  General:   Well developed, well nourished . NAD.  HEENT:  Normocephalic . Face symmetric, atraumatic Lungs:  CTA B Normal respiratory effort, no intercostal retractions, no accessory muscle use. Heart: RRR,  no murmur.  No pretibial edema bilaterally  Skin: Not pale. Not jaundice Neurologic:  alert & oriented X3.  Speech normal, gait appropriate for age and unassisted Psych--  Cognition and judgment appear intact.  Cooperative with normal attention span and concentration.  Behavior appropriate. No anxious or depressed appearing.      Assessment & Plan:   Assessment   Hyperglycemia HTN High cholesterol Hypothyroidism Anxiety depression, insomnia CV: --Atrial fibrillation DX 06-2017, s/p cardioversions, intolerant to metoprolol (?) --CAD: stent ~ 2009 Morbid obesity: BMI 42 (2011) Vitamin D deficiency, saw Dr. Cruzita Lederer 07-2016, rtc 1 year, celiac dz test (-), unlikely  Malabsortion; cont supplements  GERD H/o achalasia, s/p [place] 2009 SINGLE KIDNEY---S/p R  Nephrectomy ([place] 1991 due to stones/fibrosis) MSK: --Fibromyalgia --DJD  Knee pain R --Back pain -- s/p epidural 10-16 Osteoporosis -- per Gyn who  Rx Evista 09-2014  h/o cervical dysplasia Derm: vulvar pruritus, temovate prn ok per gyn  PLAN: HTN: Seems well controlled on Lasix, last BMP satisfactory High cholesterol: On Lipitor, check a FLP, AST, ALT Hypothyroidism: Last TSH slightly elevated, dose of Synthroid was no change, reports  Takes it regularly.  Recheck a TSH Mild anemia: Last hemoglobin is slightly low, no GI symptoms, recheck a CBC, iron and ferritin. Atrial fibrillation: Seems to be in sinus rhythm, currently on sotalol and Xarelto  w/o apparent s/e. Fatigue, DOE: Reports profound DOE initially felt to be due to beta-blockers, they were stopped but the problem has no change.  For now rec observation. Preventive care: To see gynecology this week, eye exam is scheduled for few months from now. MSK: On allopurinol, not clear why, apparently at some point when she was dx w/ FM her doctor at the time felt that she probably had gout.  Rec to decrease allopurinol to half tablet daily, discontinued completely in the future. RTC 6 months

## 2017-10-04 NOTE — Assessment & Plan Note (Signed)
HTN: Seems well controlled on Lasix, last BMP satisfactory High cholesterol: On Lipitor, check a FLP, AST, ALT Hypothyroidism: Last TSH slightly elevated, dose of Synthroid was no change, reports  Takes it regularly.  Recheck a TSH Mild anemia: Last hemoglobin is slightly low, no GI symptoms, recheck a CBC, iron and ferritin. Atrial fibrillation: Seems to be in sinus rhythm, currently on sotalol and Xarelto w/o apparent s/e. Fatigue, DOE: Reports profound DOE initially felt to be due to beta-blockers, they were stopped but the problem has no change.  For now rec observation. Preventive care: To see gynecology this week, eye exam is scheduled for few months from now. MSK: On allopurinol, not clear why, apparently at some point when she was dx w/ FM her doctor at the time felt that she probably had gout.  Rec to decrease allopurinol to half tablet daily, discontinued completely in the future. RTC 6 months

## 2017-10-04 NOTE — Patient Instructions (Addendum)
GO TO THE LAB : Get the blood work     GO TO THE FRONT DESK Schedule your next appointment for a physical exam in 5-6 months  Decrease allopurinol 100 mg to half tablet daily

## 2017-10-06 DIAGNOSIS — Z01419 Encounter for gynecological examination (general) (routine) without abnormal findings: Secondary | ICD-10-CM | POA: Diagnosis not present

## 2017-10-06 DIAGNOSIS — Z87410 Personal history of cervical dysplasia: Secondary | ICD-10-CM | POA: Diagnosis not present

## 2017-10-06 DIAGNOSIS — Z90710 Acquired absence of both cervix and uterus: Secondary | ICD-10-CM | POA: Diagnosis not present

## 2017-10-06 DIAGNOSIS — Z1151 Encounter for screening for human papillomavirus (HPV): Secondary | ICD-10-CM | POA: Diagnosis not present

## 2017-10-06 DIAGNOSIS — Z124 Encounter for screening for malignant neoplasm of cervix: Secondary | ICD-10-CM | POA: Diagnosis not present

## 2017-10-06 LAB — HM PAP SMEAR

## 2017-10-19 ENCOUNTER — Other Ambulatory Visit: Payer: Self-pay

## 2017-10-19 ENCOUNTER — Encounter (HOSPITAL_COMMUNITY): Payer: Self-pay | Admitting: Nurse Practitioner

## 2017-10-19 ENCOUNTER — Ambulatory Visit (HOSPITAL_COMMUNITY)
Admission: RE | Admit: 2017-10-19 | Discharge: 2017-10-19 | Disposition: A | Discharge status: Home / Self Care | Payer: Federal, State, Local not specified - PPO | Source: Ambulatory Visit | Attending: Nurse Practitioner | Admitting: Nurse Practitioner

## 2017-10-19 VITALS — BP 110/68 | HR 75 | Ht 64.0 in | Wt 209.0 lb

## 2017-10-19 DIAGNOSIS — Z7901 Long term (current) use of anticoagulants: Secondary | ICD-10-CM | POA: Insufficient documentation

## 2017-10-19 DIAGNOSIS — Z823 Family history of stroke: Secondary | ICD-10-CM | POA: Diagnosis not present

## 2017-10-19 DIAGNOSIS — I2510 Atherosclerotic heart disease of native coronary artery without angina pectoris: Secondary | ICD-10-CM | POA: Diagnosis not present

## 2017-10-19 DIAGNOSIS — J45909 Unspecified asthma, uncomplicated: Secondary | ICD-10-CM | POA: Insufficient documentation

## 2017-10-19 DIAGNOSIS — I480 Paroxysmal atrial fibrillation: Secondary | ICD-10-CM | POA: Diagnosis not present

## 2017-10-19 DIAGNOSIS — Z8249 Family history of ischemic heart disease and other diseases of the circulatory system: Secondary | ICD-10-CM | POA: Diagnosis not present

## 2017-10-19 DIAGNOSIS — F329 Major depressive disorder, single episode, unspecified: Secondary | ICD-10-CM | POA: Insufficient documentation

## 2017-10-19 DIAGNOSIS — Z79890 Hormone replacement therapy (postmenopausal): Secondary | ICD-10-CM | POA: Insufficient documentation

## 2017-10-19 DIAGNOSIS — E7800 Pure hypercholesterolemia, unspecified: Secondary | ICD-10-CM | POA: Diagnosis not present

## 2017-10-19 DIAGNOSIS — Z87891 Personal history of nicotine dependence: Secondary | ICD-10-CM | POA: Diagnosis not present

## 2017-10-19 DIAGNOSIS — Z905 Acquired absence of kidney: Secondary | ICD-10-CM | POA: Insufficient documentation

## 2017-10-19 DIAGNOSIS — Z7982 Long term (current) use of aspirin: Secondary | ICD-10-CM | POA: Insufficient documentation

## 2017-10-19 DIAGNOSIS — E559 Vitamin D deficiency, unspecified: Secondary | ICD-10-CM | POA: Diagnosis not present

## 2017-10-19 DIAGNOSIS — Z8541 Personal history of malignant neoplasm of cervix uteri: Secondary | ICD-10-CM | POA: Insufficient documentation

## 2017-10-19 DIAGNOSIS — I481 Persistent atrial fibrillation: Secondary | ICD-10-CM | POA: Diagnosis not present

## 2017-10-19 DIAGNOSIS — I10 Essential (primary) hypertension: Secondary | ICD-10-CM | POA: Insufficient documentation

## 2017-10-19 DIAGNOSIS — E669 Obesity, unspecified: Secondary | ICD-10-CM | POA: Diagnosis not present

## 2017-10-19 DIAGNOSIS — I4819 Other persistent atrial fibrillation: Secondary | ICD-10-CM

## 2017-10-19 DIAGNOSIS — Z79899 Other long term (current) drug therapy: Secondary | ICD-10-CM | POA: Insufficient documentation

## 2017-10-19 DIAGNOSIS — K219 Gastro-esophageal reflux disease without esophagitis: Secondary | ICD-10-CM | POA: Diagnosis not present

## 2017-10-19 DIAGNOSIS — Z833 Family history of diabetes mellitus: Secondary | ICD-10-CM | POA: Diagnosis not present

## 2017-10-19 DIAGNOSIS — Z6835 Body mass index (BMI) 35.0-35.9, adult: Secondary | ICD-10-CM | POA: Insufficient documentation

## 2017-10-19 DIAGNOSIS — Z955 Presence of coronary angioplasty implant and graft: Secondary | ICD-10-CM | POA: Insufficient documentation

## 2017-10-19 DIAGNOSIS — M797 Fibromyalgia: Secondary | ICD-10-CM | POA: Insufficient documentation

## 2017-10-19 DIAGNOSIS — E039 Hypothyroidism, unspecified: Secondary | ICD-10-CM | POA: Insufficient documentation

## 2017-10-19 DIAGNOSIS — R7303 Prediabetes: Secondary | ICD-10-CM | POA: Insufficient documentation

## 2017-10-19 DIAGNOSIS — Z801 Family history of malignant neoplasm of trachea, bronchus and lung: Secondary | ICD-10-CM | POA: Insufficient documentation

## 2017-10-19 DIAGNOSIS — Z7984 Long term (current) use of oral hypoglycemic drugs: Secondary | ICD-10-CM | POA: Diagnosis not present

## 2017-10-19 DIAGNOSIS — F419 Anxiety disorder, unspecified: Secondary | ICD-10-CM | POA: Insufficient documentation

## 2017-10-19 DIAGNOSIS — M810 Age-related osteoporosis without current pathological fracture: Secondary | ICD-10-CM | POA: Insufficient documentation

## 2017-10-19 DIAGNOSIS — Z880 Allergy status to penicillin: Secondary | ICD-10-CM | POA: Diagnosis not present

## 2017-10-19 DIAGNOSIS — I4891 Unspecified atrial fibrillation: Secondary | ICD-10-CM | POA: Diagnosis present

## 2017-10-19 NOTE — Progress Notes (Signed)
Primary Care Physician: Colon Branch, MD Referring Physician:  Dr. Rayann Heman Cardiologist: Dr. Merryl Hacker Melinda Mckinney is a 62 y.o. female with a h/o borderline DM, CAD s/p stent, obesity, HTN that developed new onset afib in 06/2017. She  had been taking weight loss pills that contained phentermine for 3 months.  She was admitted 07/27/17 for CHF associated with afib and was loaded on sotalol. SHe has been seen by Dr. Rayann Heman and Dr. Oval Linsey since d/c and was in [place].   F/u in afib clinic today, she is in [place]. She feels well. No complaints. Dr. Rayann Heman stopped Toprol for fatigue 1/24 and she states that this helped relieve her fatigue.  Today, she denies symptoms of palpitations, chest pain, shortness of breath, orthopnea, PND, lower extremity edema, dizziness, presyncope, syncope, or neurologic sequela. The patient is tolerating medications without difficulties and is otherwise without complaint today.   Past Medical History:  Diagnosis Date  . Abnormal Pap smear of cervix   . Anemia   . Anxiety and depression   . Asthma   . Atrial fibrillation ([place]) 07/2017  . Borderline diabetes   . CAD (coronary artery disease) ~2009   stent   . Cervical cancer ([place])    "stage 1; had cryotherapy"  . Cervical dysplasia   . Chronic lower back pain   . DDD (degenerative disc disease), lumbar    Dr. Nelva Bush, recommends lumbar epidural steroid injections L5-S1 to the right  . Depression   . Fibromyalgia   . GERD (gastroesophageal reflux disease)   . Headache    "only when I'm short of breath"  (07/27/2017)  . High cholesterol   . History of hiatal hernia   . History of kidney stones   . Hypertension   . Hypothyroidism   . Insomnia 09/27/2013  . Lichen sclerosus    Vulvar area  . Osteoporosis   . Thyroid disease   . Vitamin D deficiency    Past Surgical History:  Procedure Laterality Date  . CARDIAC CATHETERIZATION  ~ 2008  . CARDIOVERSION N/A 08/05/2017   Procedure: CARDIOVERSION;  Surgeon:  Evans Lance, MD;  Location: [facility];  Service: Cardiovascular;  Laterality: N/A;  . CARDIOVERSION N/A 08/24/2017   Procedure: CARDIOVERSION;  Surgeon: Sanda Klein, MD;  Location: [facility];  Service: Cardiovascular;  Laterality: N/A;  . [place]; ~ 1978/1979; 1984  . ESOPHAGOMYOTOMY  2009  . GYNECOLOGIC CRYOSURGERY  X 2   "stage 1 cancer"  . HERNIA REPAIR  X 6   "all in my stomach" (07/27/2017)  . KNEE ARTHROSCOPY Right   . NEPHRECTOMY Right 1991   in [place], due to fibrosis and lithiasis  . TUBAL LIGATION      Current Outpatient Medications  Medication Sig Dispense Refill  . allopurinol (ZYLOPRIM) 100 MG tablet Take 0.5 tablets (50 mg total) by mouth daily. 45 tablet 1  . atorvastatin (LIPITOR) 40 MG tablet Take 1 tablet (40 mg total) by mouth at bedtime. 90 tablet 2  . Cholecalciferol (VITAMIN D3) 5000 UNITS CAPS Take 1 capsule by mouth daily.     . clobetasol cream (TEMOVATE) 0.86 % Apply 1 application topically once a week. 30 g 1  . cyclobenzaprine (FLEXERIL) 10 MG tablet Take 1 tablet (10 mg total) by mouth 2 (two) times daily as needed for muscle spasms. 60 tablet 1  . DULoxetine (CYMBALTA) 60 MG capsule Take 1 capsule (60 mg total) daily by mouth. 90 capsule 1  .  furosemide (LASIX) 20 MG tablet Take 1 tablet (20 mg total) by mouth daily. 30 tablet 3  . levothyroxine (SYNTHROID, LEVOTHROID) 125 MCG tablet Take 1 tablet (125 mcg total) by mouth daily before breakfast. 90 tablet 1  . metFORMIN (GLUCOPHAGE) 500 MG tablet Take 1 tablet (500 mg total) by mouth 2 (two) times daily with a meal. 180 tablet 2  . omeprazole (PRILOSEC) 20 MG capsule Take 1 capsule (20 mg total) by mouth daily. 90 capsule 2  . raloxifene (EVISTA) 60 MG tablet Take 1 tablet (60 mg total) by mouth daily. 30 tablet 11  . rivaroxaban (XARELTO) 20 MG TABS tablet Take 1 tablet (20 mg total) by mouth daily with supper. 30 tablet 1  . sotalol (BETAPACE) 120 MG tablet Take 1 tablet (120 mg  total) by mouth every 12 (twelve) hours. 60 tablet 2  . zolpidem (AMBIEN) 5 MG tablet Take 1 tablet (5 mg total) by mouth at bedtime as needed for sleep. 90 tablet 1   No current facility-administered medications for this encounter.     Allergies  Allergen Reactions  . Penicillins Swelling and Rash    Has patient had a PCN reaction causing immediate rash, facial/tongue/throat swelling, SOB or lightheadedness with hypotension: No Has patient had a PCN reaction causing severe rash involving mucus membranes or skin necrosis: No Has patient had a PCN reaction that required hospitalization: No Has patient had a PCN reaction occurring within the last 10 years: No If all of the above answers are "NO", then may proceed with Cephalosporin use.    Social History   Socioeconomic History  . Marital status: Married    Spouse name: Not on file  . Number of children: 3  . Years of education: Not on file  . Highest education level: Not on file  Social Needs  . Financial resource strain: Not on file  . Food insecurity - worry: Not on file  . Food insecurity - inability: Not on file  . Transportation needs - medical: Not on file  . Transportation needs - non-medical: Not on file  Occupational History  . 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 Smoking status: Former Smoker    Packs/day: 2.00    Years: 28.00    Pack years: 56.00    Types: Cigarettes    Last attempt to quit: 2000    Years since quitting: 19.1  . Smokeless tobacco: Never Used  Substance and Sexual Activity  . Alcohol use: Yes    Comment: 07/27/2017 "3-4 drinks//month"  . Drug use: No  . Sexual activity: No    Comment: 1ST INTERCOURSE- 65, PARTNERS - 2  Other Topics Concern  . Not on file  Social History Narrative   Original from [place]   3 children, 2 alive   Household --pt and husband     Family History  Problem Relation Age of Onset  . Breast cancer Other        aunt   . CAD Brother   . Lung cancer Mother          F and M  . Diabetes Father        F and mother   . CAD Father   . Lung cancer Father   . Stroke Sister   . Colon cancer Neg Hx     ROS- All systems are reviewed and negative except as per the HPI above  Physical Exam: Vitals:   10/19/17 1105  BP: 110/68  Pulse: 75  SpO2: 97%  Weight: 209 lb (94.8 kg)  Height: 5\' 4"  (1.626 m)   Wt Readings from Last 3 Encounters:  10/19/17 209 lb (94.8 kg)  10/04/17 208 lb 8 oz (94.6 kg)  09/21/17 209 lb (94.8 kg)    Labs: Lab Results  Component Value Date   NA 140 08/16/2017   K 4.2 08/16/2017   CL 109 08/16/2017   CO2 27 08/16/2017   GLUCOSE 103 (H) 08/16/2017   BUN 15 08/16/2017   CREATININE 0.89 08/16/2017   CALCIUM 9.4 08/16/2017   MG 1.9 08/16/2017   Lab Results  Component Value Date   INR 1.1 (H) 06/29/2017   Lab Results  Component Value Date   CHOL 173 10/04/2017   HDL 68.90 10/04/2017   LDLCALC 76 10/04/2017   TRIG 138.0 10/04/2017     GEN- The patient is well appearing, alert and oriented x 3 today.   Head- normocephalic, atraumatic Eyes-  Sclera clear, conjunctiva pink Ears- hearing intact Oropharynx- clear Neck- supple, no JVP Lymph- no cervical lymphadenopathy Lungs- Clear to ausculation bilaterally, normal work of breathing Heart- Regular rate and rhythm, no murmurs, rubs or gallops, PMI not laterally displaced GI- soft, NT, ND, + BS Extremities- no clubbing, cyanosis, or edema MS- no significant deformity or atrophy Skin- no rash or lesion Psych- euthymic mood, full affect Neuro- strength and sensation are intact  EKG- Sinus rhythm at 75 bpm, Pr int 184 ms, qrs int 90 ms, qtc 460 ms (Stable) Epic records reviewed TSH-2/20-0.61   Assessment and Plan: 1. Paroxysmal afib  Doing well s/p sotalol load maintaining SR Continue sotalol 120 mg bid Continue xarelto 20 mg qd for chadsvasc score of at least 5  2. HTN Stable  3. CAD Currently no anginal symptoms  F/u with Dr. Rayann Heman  5/24 afib clinic as needed  Butch Penny C. Carroll, [facility] [address] [place], [place] [address] [phone]

## 2017-11-05 ENCOUNTER — Other Ambulatory Visit: Payer: Self-pay | Admitting: Internal Medicine

## 2017-11-21 ENCOUNTER — Other Ambulatory Visit: Payer: Self-pay | Admitting: Internal Medicine

## 2017-11-29 ENCOUNTER — Other Ambulatory Visit: Payer: Self-pay

## 2017-11-29 MED ORDER — FUROSEMIDE 20 MG PO TABS: 20.0000 mg | ORAL_TABLET | Freq: Every day | ORAL | 5 refills | Status: DC

## 2017-12-04 ENCOUNTER — Other Ambulatory Visit: Payer: Self-pay | Admitting: Internal Medicine

## 2017-12-04 DIAGNOSIS — I4819 Other persistent atrial fibrillation: Secondary | ICD-10-CM

## 2017-12-30 DIAGNOSIS — J00 Acute nasopharyngitis [common cold]: Secondary | ICD-10-CM | POA: Diagnosis not present

## 2018-01-05 ENCOUNTER — Ambulatory Visit (INDEPENDENT_AMBULATORY_CARE_PROVIDER_SITE_OTHER): Payer: Federal, State, Local not specified - PPO | Admitting: Internal Medicine

## 2018-01-05 ENCOUNTER — Encounter: Payer: Self-pay | Admitting: Internal Medicine

## 2018-01-05 VITALS — BP 118/62 | HR 70 | Ht 64.0 in | Wt 216.0 lb

## 2018-01-05 DIAGNOSIS — I10 Essential (primary) hypertension: Secondary | ICD-10-CM

## 2018-01-05 DIAGNOSIS — I4819 Other persistent atrial fibrillation: Secondary | ICD-10-CM

## 2018-01-05 DIAGNOSIS — I481 Persistent atrial fibrillation: Secondary | ICD-10-CM

## 2018-01-05 DIAGNOSIS — R5383 Other fatigue: Secondary | ICD-10-CM | POA: Diagnosis not present

## 2018-01-05 NOTE — Progress Notes (Signed)
PCP: Colon Branch, MD   Primary EP: Dr Rayann Heman  Melinda Mckinney is a 62 y.o. female who presents today for routine electrophysiology followup.  Since last being seen in our clinic, the patient reports doing very well. She is pleased with current state. Today, she denies symptoms of palpitations, chest pain, shortness of breath,  lower extremity edema, dizziness, presyncope, or syncope.  The patient is otherwise without complaint today.   Past Medical History:  Diagnosis Date  . Abnormal Pap smear of cervix   . Anemia   . Anxiety and depression   . Asthma   . Atrial fibrillation ([place]) 07/2017  . Borderline diabetes   . CAD (coronary artery disease) ~2009   stent   . Cervical cancer ([place])    "stage 1; had cryotherapy"  . Cervical dysplasia   . Chronic lower back pain   . DDD (degenerative disc disease), lumbar    Dr. Nelva Bush, recommends lumbar epidural steroid injections L5-S1 to the right  . Depression   . Fibromyalgia   . GERD (gastroesophageal reflux disease)   . Headache    "only when I'm short of breath"  (07/27/2017)  . High cholesterol   . History of hiatal hernia   . History of kidney stones   . Hypertension   . Hypothyroidism   . Insomnia 09/27/2013  . Lichen sclerosus    Vulvar area  . Osteoporosis   . Thyroid disease   . Vitamin D deficiency    Past Surgical History:  Procedure Laterality Date  . CARDIAC CATHETERIZATION  ~ 2008  . CARDIOVERSION N/A 08/05/2017   Procedure: CARDIOVERSION;  Surgeon: Evans Lance, MD;  Location: [facility];  Service: Cardiovascular;  Laterality: N/A;  . CARDIOVERSION N/A 08/24/2017   Procedure: CARDIOVERSION;  Surgeon: Sanda Klein, MD;  Location: [facility];  Service: Cardiovascular;  Laterality: N/A;  . [place]; ~ 1978/1979; 1984  . ESOPHAGOMYOTOMY  2009  . GYNECOLOGIC CRYOSURGERY  X 2   "stage 1 cancer"  . HERNIA REPAIR  X 6   "all in my stomach" (07/27/2017)  . KNEE ARTHROSCOPY Right   . NEPHRECTOMY Right  1991   in [place], due to fibrosis and lithiasis  . TUBAL LIGATION      ROS- all systems are reviewed and negatives except as per HPI above  Current Outpatient Medications  Medication Sig Dispense Refill  . allopurinol (ZYLOPRIM) 100 MG tablet Take 0.5 tablets (50 mg total) by mouth daily. 45 tablet 1  . atorvastatin (LIPITOR) 40 MG tablet Take 1 tablet (40 mg total) by mouth at bedtime. 90 tablet 1  . Cholecalciferol (VITAMIN D3) 5000 UNITS CAPS Take 1 capsule by mouth daily.     . clobetasol cream (TEMOVATE) 8.65 % Apply 1 application topically once a week. 30 g 1  . cyclobenzaprine (FLEXERIL) 10 MG tablet Take 1 tablet (10 mg total) by mouth 2 (two) times daily as needed for muscle spasms. 60 tablet 1  . DULoxetine (CYMBALTA) 60 MG capsule Take 1 capsule (60 mg total) daily by mouth. 90 capsule 1  . furosemide (LASIX) 20 MG tablet Take 1 tablet (20 mg total) by mouth daily. 30 tablet 5  . levothyroxine (SYNTHROID, LEVOTHROID) 125 MCG tablet Take 1 tablet (125 mcg total) by mouth daily before breakfast. 90 tablet 1  . metFORMIN (GLUCOPHAGE) 500 MG tablet Take 1 tablet (500 mg total) by mouth 2 (two) times daily with a meal. 180 tablet 2  . omeprazole (  PRILOSEC) 20 MG capsule Take 1 capsule (20 mg total) by mouth daily. 90 capsule 1  . raloxifene (EVISTA) 60 MG tablet Take 1 tablet (60 mg total) by mouth daily. 30 tablet 11  . rivaroxaban (XARELTO) 20 MG TABS tablet Take 1 tablet (20 mg total) by mouth daily with supper. 30 tablet 5  . SOTALOL AF 120 MG TABS Take 1 tablet (120 mg total) by mouth every 12 (twelve) hours. 60 tablet 9  . zolpidem (AMBIEN) 5 MG tablet Take 1 tablet (5 mg total) by mouth at bedtime as needed for sleep. 90 tablet 1   No current facility-administered medications for this visit.     Physical Exam: Vitals:   01/05/18 1118  BP: 118/62  Pulse: 70  Weight: 216 lb (98 kg)  Height: 5\' 4"  (1.626 m)    GEN- The patient is well appearing, alert and oriented x  3 today.   Head- normocephalic, atraumatic Eyes-  Sclera clear, conjunctiva pink Ears- hearing intact Oropharynx- clear Lungs- Clear to ausculation bilaterally, normal work of breathing Heart- Regular rate and rhythm, no murmurs, rubs or gallops, PMI not laterally displaced GI- soft, NT, ND, + BS Extremities- no clubbing, cyanosis, or edema  Wt Readings from Last 3 Encounters:  01/05/18 216 lb (98 kg)  10/19/17 209 lb (94.8 kg)  10/04/17 208 lb 8 oz (94.6 kg)    EKG tracing ordered today is personally reviewed and shows sinus rhythm 70 bpm  Assessment and Plan:  1. Persistent afib Maintaining sinus rhythm currently with sotalol. Consider ablation if further afib On xarelto for chads2vasc score of 5  2. HTN Stable No change required today  3. CAD No ischemic symptoms No changes  4. Overweight Body mass index is 37.08 kg/m. Wt Readings from Last 3 Encounters:  01/05/18 216 lb (98 kg)  10/19/17 209 lb (94.8 kg)  10/04/17 208 lb 8 oz (94.6 kg)   Lifestyle modification is encouraged  Return to see me in 6 months Follow-up with Dr Oval Linsey as scheduled  Thompson Grayer MD, [facility] 01/05/2018 11:37 AM

## 2018-01-05 NOTE — Patient Instructions (Addendum)
Medication Instructions:  Your physician recommends that you continue on your current medications as directed. Please refer to the Current Medication list given to you today.   Labwork: None ordered   Testing/Procedures: None ordered   Follow-Up: Your physician wants you to follow-up in: 6 months with Dr. Allred    Any Other Special Instructions Will Be Listed Below (If Applicable).     If you need a refill on your cardiac medications before your next appointment, please call your pharmacy.   

## 2018-03-04 ENCOUNTER — Other Ambulatory Visit: Payer: Self-pay | Admitting: Internal Medicine

## 2018-03-08 ENCOUNTER — Encounter: Payer: Self-pay | Admitting: Internal Medicine

## 2018-03-13 DIAGNOSIS — E119 Type 2 diabetes mellitus without complications: Secondary | ICD-10-CM | POA: Diagnosis not present

## 2018-03-13 DIAGNOSIS — H2513 Age-related nuclear cataract, bilateral: Secondary | ICD-10-CM | POA: Diagnosis not present

## 2018-03-13 DIAGNOSIS — H524 Presbyopia: Secondary | ICD-10-CM | POA: Diagnosis not present

## 2018-03-13 DIAGNOSIS — G510 Bell's palsy: Secondary | ICD-10-CM | POA: Diagnosis not present

## 2018-03-13 LAB — HM DIABETES EYE EXAM

## 2018-03-14 ENCOUNTER — Encounter: Payer: Self-pay | Admitting: Internal Medicine

## 2018-03-15 DIAGNOSIS — K220 Achalasia of cardia: Secondary | ICD-10-CM | POA: Diagnosis not present

## 2018-03-15 DIAGNOSIS — Z1211 Encounter for screening for malignant neoplasm of colon: Secondary | ICD-10-CM | POA: Diagnosis not present

## 2018-03-15 DIAGNOSIS — R1310 Dysphagia, unspecified: Secondary | ICD-10-CM | POA: Diagnosis not present

## 2018-03-15 DIAGNOSIS — K2270 Barrett's esophagus without dysplasia: Secondary | ICD-10-CM | POA: Diagnosis not present

## 2018-03-16 ENCOUNTER — Telehealth: Payer: Self-pay

## 2018-03-16 NOTE — Telephone Encounter (Signed)
   Primary Cardiologist: Thompson Grayer, MD  Chart reviewed as part of pre-operative protocol coverage. Will route to pharm for input on Xarelto, then pt will require call.  Charlie Pitter, PA-C 03/16/2018, 3:32 PM

## 2018-03-16 NOTE — Telephone Encounter (Signed)
   [facility] Pre-operative Risk Assessment    Request for surgical clearance:  1. What type of surgery is being performed? colonoscopy   2. When is this surgery scheduled? pending   3. What type of clearance is required (medical clearance vs. Pharmacy clearance to hold med vs. Both)? both  4. Are there any medications that need to be held prior to surgery and how long? Xarelto x 2 days  5. Practice name and name of physician performing surgery? [facility], Dr. Shana Chute  6. What is your office phone number [phone]   7.   What is your office fax number [phone]  8.   Anesthesia type (None, local, MAC, general) ? Not listed    Melinda Mckinney 03/16/2018, 1:42 PM  _________________________________________________________________   (provider comments below)

## 2018-03-19 NOTE — Telephone Encounter (Signed)
Patient with diagnosis of Afib on Xarelto for anticoagulation.    Procedure: colonoscopy Date of procedure: TBD  CHADS2-VASc score of  5 (CHF, HTN, AGE, DM2, stroke/tia x 2, CAD, AGE, female)  CrCl 27ml/min  Per office protocol, patient can hold Xarelto for 2 days prior to procedure.

## 2018-03-20 DIAGNOSIS — K080 Exfoliation of teeth due to systemic causes: Secondary | ICD-10-CM | POA: Diagnosis not present

## 2018-03-22 DIAGNOSIS — K080 Exfoliation of teeth due to systemic causes: Secondary | ICD-10-CM | POA: Diagnosis not present

## 2018-03-26 NOTE — Telephone Encounter (Signed)
Left message for patient to call back.   Burtis Junes, RN, [place] [address] [place], [place]  [id number] [phone]

## 2018-03-27 ENCOUNTER — Telehealth: Payer: Self-pay | Admitting: Nurse Practitioner

## 2018-03-27 NOTE — Telephone Encounter (Signed)
Follow up  ° ° °Patient is returning call.  °

## 2018-03-27 NOTE — Telephone Encounter (Signed)
No message needed °

## 2018-03-28 ENCOUNTER — Telehealth: Payer: Self-pay

## 2018-03-28 NOTE — Telephone Encounter (Signed)
   Primary Cardiologist: Thompson Grayer, MD  Chart reviewed and patient interviewed over the phone today as part of pre-operative protocol coverage. Given past medical history and based on ACC/AHA guidelines, Rana Hochstein would be at acceptable risk for the planned procedure without further cardiovascular testing.   OK to hold Xarelto 48 hours pre op.   I will route this recommendation to the requesting party via Epic fax function and remove from pre-op pool.  Please call with questions.  Kerin Ransom, PA-C 03/28/2018, 1:10 PM

## 2018-03-28 NOTE — Telephone Encounter (Signed)
   [facility] Pre-operative Risk Assessment    Request for surgical clearance:  1. What type of surgery is being performed? Colonoscopy/EGD   2. When is this surgery scheduled? TBD   3. What type of clearance is required (medical clearance vs. Pharmacy clearance to hold med vs. Both)?   Pharmacy  4. Are there any medications that need to be held prior to surgery and how long? Xarelto 2 days   5. Practice name and name of physician performing surgery? [facility]  Dr Shana Chute  6. What is your office phone number      7.   What is your office fax number [phone]  8.   Anesthesia type (None, local, MAC, general) ?    Melinda Mckinney  Melinda Mckinney 03/28/2018, 2:11 PM  _________________________________________________________________   (provider comments below)

## 2018-03-29 NOTE — Telephone Encounter (Signed)
This clearance was already addressed in 8/2 encounter. Looks as though final recommendation was not faxed until 8/14 so they likely sent another reminder request.

## 2018-03-29 NOTE — Telephone Encounter (Signed)
I re-faxed Melinda Mckinney final letter.

## 2018-04-03 ENCOUNTER — Other Ambulatory Visit: Payer: Self-pay

## 2018-04-05 ENCOUNTER — Encounter: Payer: Federal, State, Local not specified - PPO | Admitting: Internal Medicine

## 2018-04-07 ENCOUNTER — Other Ambulatory Visit: Payer: Self-pay | Admitting: Internal Medicine

## 2018-04-07 DIAGNOSIS — L304 Erythema intertrigo: Secondary | ICD-10-CM

## 2018-04-12 ENCOUNTER — Other Ambulatory Visit: Payer: Self-pay

## 2018-04-20 ENCOUNTER — Encounter: Payer: Federal, State, Local not specified - PPO | Admitting: Internal Medicine

## 2018-04-26 ENCOUNTER — Ambulatory Visit (INDEPENDENT_AMBULATORY_CARE_PROVIDER_SITE_OTHER): Payer: Federal, State, Local not specified - PPO | Admitting: Internal Medicine

## 2018-04-26 ENCOUNTER — Encounter: Payer: Self-pay | Admitting: Internal Medicine

## 2018-04-26 VITALS — BP 118/64 | Temp 98.1°F | Ht 62.0 in | Wt 220.4 lb

## 2018-04-26 DIAGNOSIS — Z0000 Encounter for general adult medical examination without abnormal findings: Secondary | ICD-10-CM

## 2018-04-26 LAB — VITAMIN D 25 HYDROXY (VIT D DEFICIENCY, FRACTURES): VITD: 32.55 ng/mL (ref 30.00–100.00)

## 2018-04-26 LAB — CBC WITH DIFFERENTIAL/PLATELET
BASOS PCT: 1.1 % (ref 0.0–3.0)
Basophils Absolute: 0.1 10*3/uL (ref 0.0–0.1)
EOS ABS: 0.1 10*3/uL (ref 0.0–0.7)
Eosinophils Relative: 1.9 % (ref 0.0–5.0)
HCT: 37.6 % (ref 36.0–46.0)
HEMOGLOBIN: 12.5 g/dL (ref 12.0–15.0)
Lymphocytes Relative: 24.9 % (ref 12.0–46.0)
Lymphs Abs: 1.5 10*3/uL (ref 0.7–4.0)
MCHC: 33.1 g/dL (ref 30.0–36.0)
MCV: 91.4 fl (ref 78.0–100.0)
Monocytes Absolute: 0.4 10*3/uL (ref 0.1–1.0)
Monocytes Relative: 7.2 % (ref 3.0–12.0)
Neutro Abs: 3.9 10*3/uL (ref 1.4–7.7)
Neutrophils Relative %: 64.9 % (ref 43.0–77.0)
Platelets: 249 10*3/uL (ref 150.0–400.0)
RBC: 4.11 Mil/uL (ref 3.87–5.11)
RDW: 13.4 % (ref 11.5–15.5)
WBC: 6.1 10*3/uL (ref 4.0–10.5)

## 2018-04-26 LAB — COMPREHENSIVE METABOLIC PANEL
ALBUMIN: 3.8 g/dL (ref 3.5–5.2)
ALK PHOS: 79 U/L (ref 39–117)
ALT: 12 U/L (ref 0–35)
AST: 15 U/L (ref 0–37)
BILIRUBIN TOTAL: 0.5 mg/dL (ref 0.2–1.2)
BUN: 20 mg/dL (ref 6–23)
CHLORIDE: 104 meq/L (ref 96–112)
CO2: 27 mEq/L (ref 19–32)
CREATININE: 0.91 mg/dL (ref 0.40–1.20)
Calcium: 9.4 mg/dL (ref 8.4–10.5)
GFR: 66.05 mL/min (ref >60.00)
Glucose, Bld: 89 mg/dL (ref 70–99)
Potassium: 4 mEq/L (ref 3.5–5.1)
Sodium: 140 mEq/L (ref 135–145)
TOTAL PROTEIN: 6.5 g/dL (ref 6.0–8.3)

## 2018-04-26 LAB — TSH: TSH: 0.6 u[IU]/mL (ref 0.35–4.50)

## 2018-04-26 NOTE — Assessment & Plan Note (Addendum)
--  Td: 2015; pnm shot 2015; prevnar 12-28-15; zostavax 12-28-2015 ; shingrex : rec to d/w insurance to be sure is covered and let me know  --Female care: sees Dr Bethann Goo in [place]. ; [facility] 09-2017(-) -- CCS: 11/21/2013: Cscope;  repeated cscope ~ 11-2015: repeat 2 years (already schedule @ [facility] ) --Labs: CMP, CBC, TSH, Vit D --lifestyle: discussed

## 2018-04-26 NOTE — Progress Notes (Signed)
Subjective:    Patient ID: Melinda Mckinney, female    DOB: 12/09/1955, 62 y.o.   MRN: [id number]  DOS:  04/26/2018 Type of visit - description : cpx, here w/ her husband Interval history: In general feeling well.  No major concerns.  Review of Systems History of fibromyalgia, she feels fatigued from time to time, has good days and days that she is not very energetic. Has hot flashes sometimes. On anticoagulants, denies any blood in the stools or blood in the urine.  Other than above, a 14 point review of systems is negative      Past Medical History:  Diagnosis Date  . Abnormal Pap smear of cervix   . Anemia   . Anxiety and depression   . Asthma   . Atrial fibrillation ([place]) 07/2017  . Borderline diabetes   . CAD (coronary artery disease) ~2009   stent   . Cervical cancer ([place])    "stage 1; had cryotherapy"  . Cervical dysplasia   . Chronic lower back pain   . DDD (degenerative disc disease), lumbar    Dr. Nelva Bush, recommends lumbar epidural steroid injections L5-S1 to the right  . Depression   . Fibromyalgia   . GERD (gastroesophageal reflux disease)   . Headache    "only when I'm short of breath"  (07/27/2017)  . High cholesterol   . History of hiatal hernia   . History of kidney stones   . Hypertension   . Hypothyroidism   . Insomnia 09/27/2013  . Lichen sclerosus    Vulvar area  . Osteoporosis   . Thyroid disease   . Vitamin D deficiency     Past Surgical History:  Procedure Laterality Date  . CARDIAC CATHETERIZATION  ~ 2008  . CARDIOVERSION N/A 08/05/2017   Procedure: CARDIOVERSION;  Surgeon: Evans Lance, MD;  Location: [facility];  Service: Cardiovascular;  Laterality: N/A;  . CARDIOVERSION N/A 08/24/2017   Procedure: CARDIOVERSION;  Surgeon: Sanda Klein, MD;  Location: [facility];  Service: Cardiovascular;  Laterality: N/A;  . [place]; ~ 1978/1979; 1984  . ESOPHAGOMYOTOMY  2009  . GYNECOLOGIC CRYOSURGERY  X 2   "stage 1 cancer"  .  HERNIA REPAIR  X 6   "all in my stomach" (07/27/2017)  . KNEE ARTHROSCOPY Right   . NEPHRECTOMY Right 1991   in [place], due to fibrosis and lithiasis  . TUBAL LIGATION      Social History   Socioeconomic History  . Marital status: Married    Spouse name: Not on file  . Number of children: 3  . Years of education: Not on file  . Highest education level: Not on file  Occupational History  . 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 Financial resource strain: Not on file  . Food insecurity:    Worry: Not on file    Inability: Not on file  . Transportation needs:    Medical: Not on file    Non-medical: Not on file  Tobacco Use  . Smoking status: Former Smoker    Packs/day: 2.00    Years: 28.00    Pack years: 56.00    Types: Cigarettes    Last attempt to quit: 2000    Years since quitting: 19.7  . Smokeless tobacco: Never Used  Substance and Sexual Activity  . Alcohol use: Yes    Comment: 07/27/2017 "3-4 drinks//month"  . Drug use: No  . Sexual activity: Not Currently  Comment: 1ST INTERCOURSE- 19, PARTNERS - 2  Lifestyle  . Physical activity:    Days per week: Not on file    Minutes per session: Not on file  . Stress: Not on file  Relationships  . Social connections:    Talks on phone: Not on file    Gets together: Not on file    Attends religious service: Not on file    Active member of club or organization: Not on file    Attends meetings of clubs or organizations: Not on file    Relationship status: Not on file  . Intimate partner violence:    Fear of current or ex partner: Not on file    Emotionally abused: Not on file    Physically abused: Not on file    Forced sexual activity: Not on file  Other Topics Concern  . Not on file  Social History Narrative   Original from [place]   3 children, 2 alive   Household --pt and husband      Family History  Problem Relation Age of Onset  . Breast cancer Other        aunt   . CAD Brother   . Lung  cancer Mother        F and M  . Diabetes Father        F and mother   . CAD Father   . Lung cancer Father   . Stroke Sister   . Colon cancer Neg Hx      Allergies as of 04/26/2018      Reactions   Penicillins Swelling, Rash   Has patient had a PCN reaction causing immediate rash, facial/tongue/throat swelling, SOB or lightheadedness with hypotension: No Has patient had a PCN reaction causing severe rash involving mucus membranes or skin necrosis: No Has patient had a PCN reaction that required hospitalization: No Has patient had a PCN reaction occurring within the last 10 years: No If all of the above answers are "NO", then may proceed with Cephalosporin use.      Medication List        Accurate as of 04/26/18 11:59 PM. Always use your most recent med list.          allopurinol 100 MG tablet Commonly known as:  ZYLOPRIM Take 0.5 tablets (50 mg total) by mouth daily.   atorvastatin 40 MG tablet Commonly known as:  LIPITOR Take 1 tablet (40 mg total) by mouth at bedtime.   clobetasol cream 0.05 % Commonly known as:  TEMOVATE APPLY TOPICALLY TO AFFECTED AREA(S) ONCE A WEEK   cyclobenzaprine 10 MG tablet Commonly known as:  FLEXERIL Take 1 tablet (10 mg total) by mouth 2 (two) times daily as needed for muscle spasms.   DULoxetine 60 MG capsule Commonly known as:  CYMBALTA Take 1 capsule (60 mg total) daily by mouth.   furosemide 20 MG tablet Commonly known as:  LASIX Take 1 tablet (20 mg total) by mouth daily.   levothyroxine 125 MCG tablet Commonly known as:  SYNTHROID, LEVOTHROID Take 1 tablet (125 mcg total) by mouth daily before breakfast.   metFORMIN 500 MG tablet Commonly known as:  GLUCOPHAGE Take 1 tablet (500 mg total) by mouth 2 (two) times daily with a meal.   omeprazole 20 MG capsule Commonly known as:  PRILOSEC Take 1 capsule (20 mg total) by mouth daily.   raloxifene 60 MG tablet Commonly known as:  EVISTA Take 1 tablet (60 mg total) by  mouth  daily.   rivaroxaban 20 MG Tabs tablet Commonly known as:  XARELTO Take 1 tablet (20 mg total) by mouth daily with supper.   SOTALOL AF 120 MG Tabs Take 1 tablet (120 mg total) by mouth every 12 (twelve) hours.   Vitamin D3 5000 units Caps Take 1 capsule by mouth daily.   zolpidem 5 MG tablet Commonly known as:  AMBIEN Take 1 tablet (5 mg total) by mouth at bedtime as needed for sleep.          Objective:   Physical Exam BP 118/64 (BP Location: Right Arm, Patient Position: Sitting, Cuff Size: Large)   Temp 98.1 F (36.7 C) (Oral)   Ht 5\' 2"  (1.575 m)   Wt 220 lb 6 oz (100 kg)   SpO2 96%   BMI 40.31 kg/m  General: Well developed, NAD, see BMI.  Neck: No  thyromegaly  HEENT:  Normocephalic . Face symmetric, atraumatic Lungs:  CTA B Normal respiratory effort, no intercostal retractions, no accessory muscle use. Heart: RRR,  no murmur.  No pretibial edema bilaterally  Abdomen:  Not distended, soft, non-tender. No rebound or rigidity.   Skin: Exposed areas without rash. Not pale. Not jaundice Neurologic:  alert & oriented X3.  Speech normal, gait appropriate for age and unassisted Strength symmetric and appropriate for age.  Psych: Cognition and judgment appear intact.  Cooperative with normal attention span and concentration.  Behavior appropriate. No anxious or depressed appearing.     Assessment & Plan:    Assessment   Hyperglycemia HTN High cholesterol Hypothyroidism Anxiety depression, insomnia CV: --Atrial fibrillation DX 06-2017, s/p cardioversions, intolerant to metoprolol (?) --CAD: stent ~ 2009 Morbid obesity: BMI 42 (2011) Vitamin D deficiency, saw Dr. Cruzita Lederer 07-2016, rtc 1 year, celiac dz test (-), unlikely  Malabsortion; cont supplements  GERD H/o achalasia, s/p [place] 2009 SINGLE KIDNEY---S/p R  Nephrectomy ([place] 1991 due to stones/fibrosis) MSK: --Fibromyalgia --DJD  Knee pain R --Back pain -- s/p epidural  10-16 Osteoporosis: Used to be managed by gynecology, now PCP. 08-2014 T score -2.8, 09-2015: T score -2.7.- Was Rx Evista 09-2014  h/o cervical dysplasia Derm: vulvar pruritus, temovate prn ok per gyn  PLAN: Hyperglycemia: Last A1c stable, check on RTC. Hypertension, high cholesterol, hypothyroidism: Continue Lipitor, Lasix, metformin, Synthroid.  Last FLP satisfactory, checking labs Fibromyalgia: On and off symptoms, on Cymbalta, no further intervention needed H/o Atrial fibrillation: Seems to be in sinus today, anticoagulated without any apparent problems. Osteoporosis: On raloxifene, complain of occasionally hot flashes , likely related to medication.  We talk about stop raloxifene and see how she does but she declined.  We agreed on do a bone density test next year , consider d/c or switch raloxifen RTC 6 months.

## 2018-04-26 NOTE — Patient Instructions (Signed)
GO TO THE LAB : Get the blood work     GO TO THE FRONT DESK Schedule your next appointment for a checkup, fasting in 6 months  Get a flu shot in October  Please check with your insurance and see if Shingrix is covered

## 2018-04-27 NOTE — Assessment & Plan Note (Signed)
Hyperglycemia: Last A1c stable, check on RTC. Hypertension, high cholesterol, hypothyroidism: Continue Lipitor, Lasix, metformin, Synthroid.  Last FLP satisfactory, checking labs Fibromyalgia: On and off symptoms, on Cymbalta, no further intervention needed H/o Atrial fibrillation: Seems to be in sinus today, anticoagulated without any apparent problems. Osteoporosis: On raloxifene, complain of occasionally hot flashes , likely related to medication.  We talk about stop raloxifene and see how she does but she declined.  We agreed on do a bone density test next year , consider d/c or switch raloxifen RTC 6 months.

## 2018-04-30 ENCOUNTER — Other Ambulatory Visit: Payer: Self-pay | Admitting: Internal Medicine

## 2018-04-30 DIAGNOSIS — K080 Exfoliation of teeth due to systemic causes: Secondary | ICD-10-CM | POA: Diagnosis not present

## 2018-05-28 ENCOUNTER — Ambulatory Visit (INDEPENDENT_AMBULATORY_CARE_PROVIDER_SITE_OTHER): Payer: Federal, State, Local not specified - PPO

## 2018-05-28 DIAGNOSIS — Z23 Encounter for immunization: Secondary | ICD-10-CM

## 2018-06-04 DIAGNOSIS — K2930 Chronic superficial gastritis without bleeding: Secondary | ICD-10-CM | POA: Diagnosis not present

## 2018-06-04 DIAGNOSIS — K2950 Unspecified chronic gastritis without bleeding: Secondary | ICD-10-CM | POA: Diagnosis not present

## 2018-06-04 DIAGNOSIS — K5730 Diverticulosis of large intestine without perforation or abscess without bleeding: Secondary | ICD-10-CM | POA: Diagnosis not present

## 2018-06-04 DIAGNOSIS — D124 Benign neoplasm of descending colon: Secondary | ICD-10-CM | POA: Diagnosis not present

## 2018-06-04 DIAGNOSIS — R1310 Dysphagia, unspecified: Secondary | ICD-10-CM | POA: Diagnosis not present

## 2018-06-04 DIAGNOSIS — K220 Achalasia of cardia: Secondary | ICD-10-CM | POA: Diagnosis not present

## 2018-06-04 DIAGNOSIS — K2970 Gastritis, unspecified, without bleeding: Secondary | ICD-10-CM | POA: Diagnosis not present

## 2018-06-04 DIAGNOSIS — K210 Gastro-esophageal reflux disease with esophagitis: Secondary | ICD-10-CM | POA: Diagnosis not present

## 2018-06-04 DIAGNOSIS — Z86010 Personal history of colonic polyps: Secondary | ICD-10-CM | POA: Diagnosis not present

## 2018-06-04 DIAGNOSIS — K449 Diaphragmatic hernia without obstruction or gangrene: Secondary | ICD-10-CM | POA: Diagnosis not present

## 2018-06-04 DIAGNOSIS — Z1211 Encounter for screening for malignant neoplasm of colon: Secondary | ICD-10-CM | POA: Diagnosis not present

## 2018-06-04 DIAGNOSIS — D123 Benign neoplasm of transverse colon: Secondary | ICD-10-CM | POA: Diagnosis not present

## 2018-06-04 LAB — HM COLONOSCOPY

## 2018-07-02 ENCOUNTER — Other Ambulatory Visit: Payer: Self-pay | Admitting: Internal Medicine

## 2018-07-09 ENCOUNTER — Ambulatory Visit (INDEPENDENT_AMBULATORY_CARE_PROVIDER_SITE_OTHER): Payer: Federal, State, Local not specified - PPO | Admitting: Internal Medicine

## 2018-07-09 ENCOUNTER — Encounter: Payer: Self-pay | Admitting: Internal Medicine

## 2018-07-09 VITALS — BP 108/72 | HR 67 | Ht 64.0 in | Wt 230.4 lb

## 2018-07-09 DIAGNOSIS — R5383 Other fatigue: Secondary | ICD-10-CM

## 2018-07-09 DIAGNOSIS — I10 Essential (primary) hypertension: Secondary | ICD-10-CM

## 2018-07-09 DIAGNOSIS — I2510 Atherosclerotic heart disease of native coronary artery without angina pectoris: Secondary | ICD-10-CM

## 2018-07-09 DIAGNOSIS — I4819 Other persistent atrial fibrillation: Secondary | ICD-10-CM | POA: Diagnosis not present

## 2018-07-09 NOTE — Progress Notes (Signed)
PCP: Colon Branch, MD Primary Cardiologist: Dr Oval Linsey Primary EP: Dr Rayann Heman  Melinda Mckinney is a 62 y.o. female who presents today for routine electrophysiology followup.  Since last being seen in our clinic, the patient reports doing very well.  Today, she denies symptoms of palpitations, chest pain, shortness of breath,  lower extremity edema, dizziness, presyncope, or syncope.  The patient is otherwise without complaint today.   Past Medical History:  Diagnosis Date  . Abnormal Pap smear of cervix   . Anemia   . Anxiety and depression   . Asthma   . Atrial fibrillation ([place]) 07/2017  . Borderline diabetes   . CAD (coronary artery disease) ~2009   stent   . Cervical cancer ([place])    "stage 1; had cryotherapy"  . Cervical dysplasia   . Chronic lower back pain   . DDD (degenerative disc disease), lumbar    Dr. Nelva Bush, recommends lumbar epidural steroid injections L5-S1 to the right  . Depression   . Fibromyalgia   . GERD (gastroesophageal reflux disease)   . Headache    "only when I'm short of breath"  (07/27/2017)  . High cholesterol   . History of hiatal hernia   . History of kidney stones   . Hypertension   . Hypothyroidism   . Insomnia 09/27/2013  . Lichen sclerosus    Vulvar area  . Osteoporosis   . Thyroid disease   . Vitamin D deficiency    Past Surgical History:  Procedure Laterality Date  . CARDIAC CATHETERIZATION  ~ 2008  . CARDIOVERSION N/A 08/05/2017   Procedure: CARDIOVERSION;  Surgeon: Evans Lance, MD;  Location: [facility];  Service: Cardiovascular;  Laterality: N/A;  . CARDIOVERSION N/A 08/24/2017   Procedure: CARDIOVERSION;  Surgeon: Sanda Klein, MD;  Location: [facility];  Service: Cardiovascular;  Laterality: N/A;  . [place]; ~ 1978/1979; 1984  . ESOPHAGOMYOTOMY  2009  . GYNECOLOGIC CRYOSURGERY  X 2   "stage 1 cancer"  . HERNIA REPAIR  X 6   "all in my stomach" (07/27/2017)  . KNEE ARTHROSCOPY Right   . NEPHRECTOMY Right 1991     in [place], due to fibrosis and lithiasis  . TUBAL LIGATION      ROS- all systems are reviewed and negatives except as per HPI above  Current Outpatient Medications  Medication Sig Dispense Refill  . allopurinol (ZYLOPRIM) 100 MG tablet Take 0.5 tablets (50 mg total) by mouth daily. 45 tablet 1  . atorvastatin (LIPITOR) 40 MG tablet Take 1 tablet (40 mg total) by mouth at bedtime. 90 tablet 1  . Cholecalciferol (VITAMIN D3) 5000 UNITS CAPS Take 1 capsule by mouth daily.     . clobetasol cream (TEMOVATE) 0.05 % APPLY TOPICALLY TO AFFECTED AREA(S) ONCE A WEEK 30 g 0  . cyclobenzaprine (FLEXERIL) 10 MG tablet Take 1 tablet (10 mg total) by mouth 2 (two) times daily as needed for muscle spasms. 60 tablet 1  . DULoxetine (CYMBALTA) 60 MG capsule Take 1 capsule (60 mg total) daily by mouth. 90 capsule 1  . furosemide (LASIX) 20 MG tablet Take 1 tablet (20 mg total) by mouth daily. 30 tablet 5  . levothyroxine (SYNTHROID, LEVOTHROID) 125 MCG tablet Take 1 tablet (125 mcg total) by mouth daily before breakfast. 90 tablet 1  . metFORMIN (GLUCOPHAGE) 500 MG tablet Take 1 tablet (500 mg total) by mouth 2 (two) times daily with a meal. 180 tablet 2  . omeprazole (PRILOSEC)  20 MG capsule Take 1 capsule (20 mg total) by mouth daily. 90 capsule 1  . raloxifene (EVISTA) 60 MG tablet Take 1 tablet (60 mg total) by mouth daily. 30 tablet 11  . rivaroxaban (XARELTO) 20 MG TABS tablet Take 1 tablet (20 mg total) by mouth daily with supper. 30 tablet 5  . SOTALOL AF 120 MG TABS Take 1 tablet (120 mg total) by mouth every 12 (twelve) hours. 60 tablet 9  . zolpidem (AMBIEN) 5 MG tablet Take 1 tablet (5 mg total) by mouth at bedtime as needed for sleep. 90 tablet 1   No current facility-administered medications for this visit.     Physical Exam: Vitals:   07/09/18 0925  BP: 108/72  Pulse: 67  SpO2: 99%  Weight: 230 lb 6.4 oz (104.5 kg)  Height: 5\' 4"  (1.626 m)    GEN- The patient is overweight  appearing, alert and oriented x 3 today.   Head- normocephalic, atraumatic Eyes-  Sclera clear, conjunctiva pink Ears- hearing intact Oropharynx- clear Lungs- Clear to ausculation bilaterally, normal work of breathing Heart- Regular rate and rhythm, no murmurs, rubs or gallops, PMI not laterally displaced GI- soft, NT, ND, + BS Extremities- no clubbing, cyanosis, or edema  Wt Readings from Last 3 Encounters:  07/09/18 230 lb 6.4 oz (104.5 kg)  04/26/18 220 lb 6 oz (100 kg)  01/05/18 216 lb (98 kg)    EKG tracing ordered today is personally reviewed and shows sinus rhythm  Assessment and Plan:  1. Persistent afib Doing well with sotalol chads2vasc score is 5 Consider ablation if further AF Labs 9/19 reviewed  2. HTN Stable No change required today  3. CAD No ischemic symptoms  4. Overweight I will refer to [facility] AF exercise program  Though she lives in [place], she is interested.  Follow-up in AF clinic every 6 months and with Dr Oval Linsey as scheduled I will see when needed  Thompson Grayer MD, [facility] 07/09/2018 9:39 AM

## 2018-07-09 NOTE — Patient Instructions (Addendum)
Medication Instructions:  Your physician recommends that you continue on your current medications as directed. Please refer to the Current Medication list given to you today.  Labwork: None ordered.  Testing/Procedures: None ordered.  Follow-Up: Your physician wants you to follow-up in: 6 months with Adline Peals at the [place] clinic.   You will receive a reminder letter in the mail two months in advance. If you don't receive a letter, please call our office to schedule the follow-up appointment.  Any Other Special Instructions Will Be Listed Below (If Applicable).  If you need a refill on your cardiac medications before your next appointment, please call your pharmacy.

## 2018-07-20 ENCOUNTER — Telehealth: Payer: Self-pay

## 2018-07-20 NOTE — Telephone Encounter (Signed)
Left a VM for Ms. Kowal regarding the PREP at the [facility].  Asked for her to call me back to discuss details.

## 2018-07-25 ENCOUNTER — Telehealth: Payer: Self-pay

## 2018-07-25 NOTE — Telephone Encounter (Signed)
Left VM for Melinda Mckinney [facility] and for her to call back for more information and to enroll if interested.

## 2018-08-20 ENCOUNTER — Other Ambulatory Visit: Payer: Self-pay | Admitting: Internal Medicine

## 2018-08-20 ENCOUNTER — Encounter: Payer: Self-pay | Admitting: Internal Medicine

## 2018-08-20 ENCOUNTER — Ambulatory Visit (INDEPENDENT_AMBULATORY_CARE_PROVIDER_SITE_OTHER): Payer: Federal, State, Local not specified - PPO | Admitting: Internal Medicine

## 2018-08-20 ENCOUNTER — Ambulatory Visit: Payer: Federal, State, Local not specified - PPO | Admitting: Internal Medicine

## 2018-08-20 VITALS — BP 120/60 | HR 65 | Ht 64.0 in | Wt 236.0 lb

## 2018-08-20 DIAGNOSIS — E559 Vitamin D deficiency, unspecified: Secondary | ICD-10-CM | POA: Diagnosis not present

## 2018-08-20 DIAGNOSIS — E039 Hypothyroidism, unspecified: Secondary | ICD-10-CM

## 2018-08-20 DIAGNOSIS — M810 Age-related osteoporosis without current pathological fracture: Secondary | ICD-10-CM | POA: Diagnosis not present

## 2018-08-20 NOTE — Progress Notes (Addendum)
Patient ID: Melinda Mckinney, female   DOB: Apr 27, 1956, 63 y.o.   MRN: [id number]   HPI  Melinda Mckinney is a 63 y.o.-year-old female, returning for f/u for vitamin D deficiency, osteoporosis, and hypothyroidism. Last visit 1 year ago. Prev. Seen by Dr. Toney Rakes, now retired.  Osteoporosis.   I reviewed her latest DXA scan reports: Date L1-L4 T score FN T score  09/04/2014 ([facility])  -2.8 LFN: -1.1 RFN: -1.2  03/25/2007 ([facility])  -2.6    On Evista since 09/2017 - Rx'ed by Dr. Toney Rakes.   No fractures.  No falls since last visit.  Vitamin D def  - dx 08/2014 -  no history of hypocalcemia.  Reviewed her vitamin D levels: Lab Results  Component Value Date   VD25OH 32.55 04/26/2018   VD25OH 40 09/08/2016   VD25OH 29.88 (L) 08/09/2016   VD25OH 24 (L) 09/03/2015   VD25OH 29.63 (L) 08/07/2015   VD25OH 26.02 (L) 01/19/2015   VD25OH 17 (L) 12/12/2014   VD25OH 16 (L) 12/10/2014   VD25OH 24 (L) 09/12/2014   Pt was started on Ergocalciferol 50,000 units weekly in 09/2014 >> switched to:  Vitamin D 5000 units daily >> increased to 7000 units daily 07/2016. However, she is now taking only 5000 units daily...   Reviewed hx: Celiac panel was negative in 11/2014.  She has a history of abdominal pain/bloating/nausea/sometimes vomiting. She was seen by GI >> EGD >> had surgery for achalasia.  Lab Results  Component Value Date   PTH 31 09/12/2014   CALCIUM 9.4 04/26/2018   CALCIUM 9.4 08/16/2017   CALCIUM 8.9 08/06/2017   CALCIUM 9.5 08/05/2017   CALCIUM 9.4 08/04/2017   CALCIUM 9.3 08/03/2017   CALCIUM 9.1 07/29/2017   CALCIUM 9.5 07/28/2017   CALCIUM 9.1 07/27/2017   CALCIUM 9.1 07/05/2017   She has a history of kidney stones. R nephrectomy for hydronephrosis 2/2 kidney stone.  At that time, she also had surgery for incarcerated hernia - had several hernias  No history of CKD. Last BUN/Cr: Lab Results  Component Value Date   BUN 20 04/26/2018   CREATININE 0.91 04/26/2018    Hypothyroidism:  Pt is on levothyroxine 125 mcg daily, taken: - in am - fasting - at least 30 min from b'fast - no Ca, Fe, MVI - Prilosec increased to 2x a day: first dose ~3h after LT4 - not on Biotin  Latest TSH was normal: Lab Results  Component Value Date   TSH 0.60 04/26/2018   TSH 0.61 10/04/2017   TSH 4.801 (H) 07/28/2017   TSH 0.69 06/29/2017   TSH 2.02 01/06/2017   TSH 0.52 06/29/2016   TSH 3.37 12/28/2015   TSH 0.72 08/07/2015   TSH 2.27 04/09/2015   TSH 3.58 02/06/2015   Pt denies: - feeling nodules in neck - hoarseness - choking - SOB with lying down She does have dysphagia which is believed due to GERD so this summer she was started on an increased dose of PPI.  + FH with thyroid ds: Mother, daughter, and son. No FH of thyroid cancer. No h/o radiation tx to head or neck.  No seaweed or kelp. No recent contrast studies. No herbal supplements. No Biotin use. No recent steroids use.   I reviewed her chart and she also has a history of prior nephrectomy, OA. She was has A fib, history of acute CHF. She was started on Sotalol before last visit>> then had cardioversion. Now on Metoprolol, Xarelto.  Since then, she had cardioversion  in 08/2017.  ROS: Constitutional: + weight gain/no weight loss, no fatigue, no subjective hyperthermia, no subjective hypothermia Eyes: no blurry vision, no xerophthalmia ENT: no sore throat, + see HPI Cardiovascular: no CP/no SOB/+ palpitations/no leg swelling Respiratory: no cough/no SOB/no wheezing Gastrointestinal: no N/no V/no D/no C/+ acid reflux Musculoskeletal: no muscle aches/+ joint aches Skin: no rashes, no hair loss Neurological: no tremors/no numbness/no tingling/no dizziness  I reviewed pt's medications, allergies, PMH, social hx, family hx, and changes were documented in the history of present illness. Otherwise, unchanged from my initial visit note.  Past Medical History:  Diagnosis Date  . Abnormal Pap smear  of cervix   . Anemia   . Anxiety and depression   . Asthma   . Atrial fibrillation ([place]) 07/2017  . Borderline diabetes   . CAD (coronary artery disease) ~2009   stent   . Cervical cancer ([place])    "stage 1; had cryotherapy"  . Cervical dysplasia   . Chronic lower back pain   . DDD (degenerative disc disease), lumbar    Dr. Nelva Bush, recommends lumbar epidural steroid injections L5-S1 to the right  . Depression   . Fibromyalgia   . GERD (gastroesophageal reflux disease)   . Headache    "only when I'm short of breath"  (07/27/2017)  . High cholesterol   . History of hiatal hernia   . History of kidney stones   . Hypertension   . Hypothyroidism   . Insomnia 09/27/2013  . Lichen sclerosus    Vulvar area  . Osteoporosis   . Thyroid disease   . Vitamin D deficiency    Past Surgical History:  Procedure Laterality Date  . CARDIAC CATHETERIZATION  ~ 2008  . CARDIOVERSION N/A 08/05/2017   Procedure: CARDIOVERSION;  Surgeon: Evans Lance, MD;  Location: [facility];  Service: Cardiovascular;  Laterality: N/A;  . CARDIOVERSION N/A 08/24/2017   Procedure: CARDIOVERSION;  Surgeon: Sanda Klein, MD;  Location: [facility];  Service: Cardiovascular;  Laterality: N/A;  . [place]; ~ 1978/1979; 1984  . ESOPHAGOMYOTOMY  2009  . GYNECOLOGIC CRYOSURGERY  X 2   "stage 1 cancer"  . HERNIA REPAIR  X 6   "all in my stomach" (07/27/2017)  . KNEE ARTHROSCOPY Right   . NEPHRECTOMY Right 1991   in [place], due to fibrosis and lithiasis  . TUBAL LIGATION     Social History   Socioeconomic History  . Marital status: Married    Spouse name: Not on file  . Number of children: 3  . Years of education: Not on file  . Highest education level: Not on file  Occupational History  . 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 Financial resource strain: Not on file  . Food insecurity:    Worry: Not on file    Inability: Not on file  . Transportation needs:    Medical: Not on file     Non-medical: Not on file  Tobacco Use  . Smoking status: Former Smoker    Packs/day: 2.00    Years: 28.00    Pack years: 56.00    Types: Cigarettes    Last attempt to quit: 2000    Years since quitting: 20.0  . Smokeless tobacco: Never Used  Substance and Sexual Activity  . Alcohol use: Yes    Comment: 07/27/2017 "3-4 drinks//month"  . Drug use: No  . Sexual activity: Not Currently    Comment: 1ST INTERCOURSE- 16, PARTNERS - 2  Lifestyle  . Physical activity:    Days per week: Not on file    Minutes per session: Not on file  . Stress: Not on file  Relationships  . Social connections:    Talks on phone: Not on file    Gets together: Not on file    Attends religious service: Not on file    Active member of club or organization: Not on file    Attends meetings of clubs or organizations: Not on file    Relationship status: Not on file  . Intimate partner violence:    Fear of current or ex partner: Not on file    Emotionally abused: Not on file    Physically abused: Not on file    Forced sexual activity: Not on file  Other Topics Concern  . Not on file  Social History Narrative   Original from [place]   3 children, 2 alive   Household --pt and husband    Current Outpatient Medications on File Prior to Visit  Medication Sig Dispense Refill  . allopurinol (ZYLOPRIM) 100 MG tablet Take 0.5 tablets (50 mg total) by mouth daily. 45 tablet 1  . atorvastatin (LIPITOR) 40 MG tablet Take 1 tablet (40 mg total) by mouth at bedtime. 90 tablet 1  . Cholecalciferol (VITAMIN D3) 5000 UNITS CAPS Take 1 capsule by mouth daily.     . clobetasol cream (TEMOVATE) 0.05 % APPLY TOPICALLY TO AFFECTED AREA(S) ONCE A WEEK 30 g 0  . cyclobenzaprine (FLEXERIL) 10 MG tablet Take 1 tablet (10 mg total) by mouth 2 (two) times daily as needed for muscle spasms. 60 tablet 1  . DULoxetine (CYMBALTA) 60 MG capsule Take 1 capsule (60 mg total) daily by mouth. 90 capsule 1  . furosemide (LASIX) 20 MG  tablet Take 1 tablet (20 mg total) by mouth daily. 30 tablet 5  . levothyroxine (SYNTHROID, LEVOTHROID) 125 MCG tablet Take 1 tablet (125 mcg total) by mouth daily before breakfast. 90 tablet 1  . metFORMIN (GLUCOPHAGE) 500 MG tablet Take 1 tablet (500 mg total) by mouth 2 (two) times daily with a meal. 180 tablet 2  . omeprazole (PRILOSEC) 20 MG capsule Take 1 capsule (20 mg total) by mouth daily. 90 capsule 1  . raloxifene (EVISTA) 60 MG tablet Take 1 tablet (60 mg total) by mouth daily. 30 tablet 11  . rivaroxaban (XARELTO) 20 MG TABS tablet Take 1 tablet (20 mg total) by mouth daily with supper. 30 tablet 5  . SOTALOL AF 120 MG TABS Take 1 tablet (120 mg total) by mouth every 12 (twelve) hours. 60 tablet 9  . zolpidem (AMBIEN) 5 MG tablet Take 1 tablet (5 mg total) by mouth at bedtime as needed for sleep. 90 tablet 1   No current facility-administered medications on file prior to visit.    Allergies  Allergen Reactions  . Penicillins Swelling and Rash    Has patient had a PCN reaction causing immediate rash, facial/tongue/throat swelling, SOB or lightheadedness with hypotension: No Has patient had a PCN reaction causing severe rash involving mucus membranes or skin necrosis: No Has patient had a PCN reaction that required hospitalization: No Has patient had a PCN reaction occurring within the last 10 years: No If all of the above answers are "NO", then may proceed with Cephalosporin use.   Family History  Problem Relation Age of Onset  . Breast cancer Other        aunt   . CAD Brother   .  Lung cancer Mother        F and M  . Diabetes Father        F and mother   . CAD Father   . Lung cancer Father   . Stroke Sister   . Colon cancer Neg Hx    PE: BP 120/60   Pulse 65   Ht 5\' 4"  (1.626 m) Comment: measured  Wt 236 lb (107 kg)   SpO2 98%   BMI 40.51 kg/m  Body mass index is 40.51 kg/m. Wt Readings from Last 3 Encounters:  08/20/18 236 lb (107 kg)  07/09/18 230 lb 6.4 oz  (104.5 kg)  04/26/18 220 lb 6 oz (100 kg)   Constitutional: Obese, in NAD Eyes: PERRLA, EOMI, no exophthalmos ENT: moist mucous membranes, no thyromegaly, no cervical lymphadenopathy Cardiovascular: RRR, No MRG Respiratory: CTA B Gastrointestinal: abdomen soft, NT, ND, BS+ Musculoskeletal: no deformities, strength intact in all 4 Skin: moist, warm, no rashes Neurological: no tremor with outstretched hands, DTR normal in all 4  Assessment: 1.  Osteoporosis  2. Vitamin D deficiency -Reviewing her chart, there is no obvious reason for malabsorption: Celiac workup was negative, no colon diseases, she had a history of Barrett's esophagus and has surveillance EGDs and also had achalasia surgery  3. Hypothyroidism  PLAN: 1. Osteoporosis -No fractures or falls since last visit.  Right before last visit she tripped and fell but did not have a fracture -Latest DEXA scan from 3 years ago showed osteoporosis at the level of the spine -No falls or fractures since last visit - Reviewed together her latest DXA scan from 2 years ago >> osteoporotic at the level of the spine -She is due for a bone density scan- she was having these at [facility], but she is not seeing OB/GYN anymore.  Will order this to be done at the [facility] office -We discussed that at 1 point will need to stop the Evista, which she continues for the last 4 years.  However, would like to see a bone density first.  2.  Patient with history of vitamin D insufficiency despite supplementation  -she was previously on ergocalciferol 50,000 units once a week, then switch to vitamin D 5000 units daily + a multivitamin.  Since vitamin D was still low at that time I advised her to increase the supplement dose to 7000 units daily.  A subsequent vitamin D level was normal in 08/2016 and it remains normal as per the last check in 04/2018 (32.55).  She is off the multivitamin.  She continued 7000 units daily, but since then, she switch to 5000 units daily,  unclear when. -We will continue her current supplementation with 5000 units vitamin D daily and recheck the level in 2 to 3 months -I will see her back in a year  3. Hypothyroidism - latest thyroid labs reviewed with pt >> normal 04/2018 - she continues on LT4 125 mcg daily - pt feels good on this dose. - we discussed about taking the thyroid hormone every day, with water, >30 minutes before breakfast, separated by >4 hours from acid reflux medications, calcium, iron, multivitamins. Pt. is taking it correctly. -We will check the labs again in 2 to 3 months  Orders Placed This Encounter  Procedures  . DG Bone Density  . TSH  . T4, free  . VITAMIN D 25 Hydroxy (Vit-D Deficiency, Fractures)   09/06/2018 ([place]) Lumbar spine L1-L4 Femoral neck (FN) 33% distal radius  T-score -2.8 RFN: -1.5  LFN: -1.1 n/a  Change in BMD from previous DXA test (%) n/a n/a n/a  (*) statistically significant  Right femoral neck bone density is lower than before, with the rest of the T-scores being stable.  However, the last 2 bone density reports are not directly comparable since they were done on different machines. We can continue Evista for now.  Philemon Kingdom, MD PhD [facility] Endocrinology

## 2018-08-20 NOTE — Patient Instructions (Signed)
Please come back for labs in 2-3 months.  Continue Levothyroxine 125 mcg daily.  Take the thyroid hormone every day, with water, at least 30 minutes before breakfast, separated by at least 4 hours from: - acid reflux medications - calcium - iron - multivitamins  We will schedule your next DXA scan at our [facility] office.  Please come back for a follow-up appointment in 1 year.

## 2018-09-06 ENCOUNTER — Ambulatory Visit (INDEPENDENT_AMBULATORY_CARE_PROVIDER_SITE_OTHER)
Admission: RE | Admit: 2018-09-06 | Discharge: 2018-09-06 | Disposition: A | Discharge status: Home / Self Care | Payer: Federal, State, Local not specified - PPO | Source: Ambulatory Visit | Attending: Internal Medicine | Admitting: Internal Medicine

## 2018-09-06 DIAGNOSIS — M810 Age-related osteoporosis without current pathological fracture: Secondary | ICD-10-CM | POA: Diagnosis not present

## 2018-09-09 DIAGNOSIS — M810 Age-related osteoporosis without current pathological fracture: Secondary | ICD-10-CM | POA: Diagnosis not present

## 2018-09-12 ENCOUNTER — Other Ambulatory Visit: Payer: Self-pay | Admitting: Internal Medicine

## 2018-09-20 DIAGNOSIS — K449 Diaphragmatic hernia without obstruction or gangrene: Secondary | ICD-10-CM | POA: Diagnosis not present

## 2018-09-20 DIAGNOSIS — K209 Esophagitis, unspecified: Secondary | ICD-10-CM | POA: Diagnosis not present

## 2018-09-20 DIAGNOSIS — K2270 Barrett's esophagus without dysplasia: Secondary | ICD-10-CM | POA: Diagnosis not present

## 2018-09-24 DIAGNOSIS — Z1231 Encounter for screening mammogram for malignant neoplasm of breast: Secondary | ICD-10-CM | POA: Diagnosis not present

## 2018-09-24 LAB — HM MAMMOGRAPHY

## 2018-09-27 ENCOUNTER — Encounter: Payer: Self-pay | Admitting: Internal Medicine

## 2018-09-27 DIAGNOSIS — K080 Exfoliation of teeth due to systemic causes: Secondary | ICD-10-CM | POA: Diagnosis not present

## 2018-10-01 ENCOUNTER — Telehealth: Payer: Self-pay | Admitting: Internal Medicine

## 2018-10-01 ENCOUNTER — Other Ambulatory Visit: Payer: Self-pay | Admitting: Internal Medicine

## 2018-10-01 DIAGNOSIS — I4819 Other persistent atrial fibrillation: Secondary | ICD-10-CM

## 2018-10-01 NOTE — Telephone Encounter (Signed)
Pt is requesting refill on Ambien.   Last OV: 04/26/2018 Last Fill: 10/04/2017 #90 and 1RF UDS: None, Ambien only

## 2018-10-01 NOTE — Telephone Encounter (Signed)
Sent #90

## 2018-10-17 ENCOUNTER — Ambulatory Visit (INDEPENDENT_AMBULATORY_CARE_PROVIDER_SITE_OTHER): Payer: Medicare Other | Admitting: Internal Medicine

## 2018-10-17 ENCOUNTER — Encounter: Payer: Self-pay | Admitting: Internal Medicine

## 2018-10-17 VITALS — BP 126/68 | HR 73 | Temp 98.3°F | Resp 16 | Ht 64.0 in | Wt 235.4 lb

## 2018-10-17 DIAGNOSIS — E039 Hypothyroidism, unspecified: Secondary | ICD-10-CM

## 2018-10-17 DIAGNOSIS — I10 Essential (primary) hypertension: Secondary | ICD-10-CM

## 2018-10-17 DIAGNOSIS — R739 Hyperglycemia, unspecified: Secondary | ICD-10-CM | POA: Diagnosis not present

## 2018-10-17 DIAGNOSIS — I480 Paroxysmal atrial fibrillation: Secondary | ICD-10-CM | POA: Diagnosis not present

## 2018-10-17 DIAGNOSIS — L304 Erythema intertrigo: Secondary | ICD-10-CM | POA: Diagnosis not present

## 2018-10-17 LAB — LIPID PANEL
CHOL/HDL RATIO: 3
Cholesterol: 217 mg/dL — ABNORMAL HIGH (ref 0–200)
HDL: 67.6 mg/dL (ref >39.00)
LDL Cholesterol: 118 mg/dL — ABNORMAL HIGH (ref 0–99)
NonHDL: 149.25
Triglycerides: 155 mg/dL — ABNORMAL HIGH (ref 0.0–149.0)
VLDL: 31 mg/dL (ref 0.0–40.0)

## 2018-10-17 LAB — COMPREHENSIVE METABOLIC PANEL
ALT: 14 U/L (ref 0–35)
AST: 17 U/L (ref 0–37)
Albumin: 4.1 g/dL (ref 3.5–5.2)
Alkaline Phosphatase: 102 U/L (ref 39–117)
BUN: 18 mg/dL (ref 6–23)
CO2: 29 mEq/L (ref 19–32)
Calcium: 9.6 mg/dL (ref 8.4–10.5)
Chloride: 103 mEq/L (ref 96–112)
Creatinine, Ser: 0.72 mg/dL (ref 0.40–1.20)
GFR: 81.3 mL/min (ref >60.00)
Glucose, Bld: 95 mg/dL (ref 70–99)
Potassium: 4.6 mEq/L (ref 3.5–5.1)
Sodium: 140 mEq/L (ref 135–145)
TOTAL PROTEIN: 6.8 g/dL (ref 6.0–8.3)
Total Bilirubin: 0.5 mg/dL (ref 0.2–1.2)

## 2018-10-17 LAB — TSH: TSH: 1 u[IU]/mL (ref 0.35–4.50)

## 2018-10-17 LAB — CBC WITH DIFFERENTIAL/PLATELET
Basophils Absolute: 0.1 10*3/uL (ref 0.0–0.1)
Basophils Relative: 1.3 % (ref 0.0–3.0)
EOS ABS: 0.1 10*3/uL (ref 0.0–0.7)
Eosinophils Relative: 2.3 % (ref 0.0–5.0)
HCT: 36.5 % (ref 36.0–46.0)
Hemoglobin: 12.3 g/dL (ref 12.0–15.0)
Lymphocytes Relative: 26 % (ref 12.0–46.0)
Lymphs Abs: 1.4 10*3/uL (ref 0.7–4.0)
MCHC: 33.6 g/dL (ref 30.0–36.0)
MCV: 91 fl (ref 78.0–100.0)
Monocytes Absolute: 0.4 10*3/uL (ref 0.1–1.0)
Monocytes Relative: 7.2 % (ref 3.0–12.0)
NEUTROS ABS: 3.4 10*3/uL (ref 1.4–7.7)
Neutrophils Relative %: 63.2 % (ref 43.0–77.0)
PLATELETS: 264 10*3/uL (ref 150.0–400.0)
RBC: 4.01 Mil/uL (ref 3.87–5.11)
RDW: 14.1 % (ref 11.5–15.5)
WBC: 5.4 10*3/uL (ref 4.0–10.5)

## 2018-10-17 LAB — HEMOGLOBIN A1C: Hgb A1c MFr Bld: 5.7 % (ref 4.6–6.5)

## 2018-10-17 MED ORDER — DILTIAZEM HCL 120 MG PO TABS: 60.0000 mg | ORAL_TABLET | Freq: Two times a day (BID) | ORAL | 5 refills | Status: DC

## 2018-10-17 MED ORDER — CLOBETASOL PROPIONATE 0.05 % EX CREA: TOPICAL_CREAM | CUTANEOUS | 2 refills | Status: DC

## 2018-10-17 NOTE — Patient Instructions (Addendum)
GO TO THE LAB : Get the blood work     GO TO THE FRONT DESK Schedule your next appointment   for a checkup in 4 weeks  Rest Drink plenty fluids Continue the same medications Call if you have any problems.

## 2018-10-17 NOTE — Progress Notes (Signed)
Subjective:    Patient ID: Melinda Mckinney, female    DOB: [birth year], 63 y.o.   MRN: [id number]  DOS:  10/17/2018 Type of visit - description: Acute visit On 10/11/2018, patient was in [place], had a sudden episode of nausea and vomited twice.  Vomit was somewhat forceful.  Prior to this episode, she was not feeling well for a couple of days, symptoms were vague, some malaise and dizziness. Shortly after she felt very weak, she "almost passed out", LOC?  Few seconds?. Was seen immediately by a physician, BP was 160/100.  Was sent to the ER.  Was evaluated in a local ER and saw a cardiologist.  Documents in [place] reviewed:Marland Kitchen CBC showed a hemoglobin of 11.6, white cells of 6.6.  Glucose 120.  Creatinine 0.9.  AST ALT normal.  Total CK normal.  Troponin negative.  Potassium 3.8.  BNP 102, normal. EKG apparently normal. Low-dose diltiazem was added to her regimen. Here for follow-up   Review of Systems Since she came back from [place], she is gradually feeling better. No further nausea or vomiting.  Never had diarrhea. She was weak but is better She was a slightly dizzy but is better for the last 2 days. Denies fever chills No chest pain, difficulty breathing, palpitations. She had symmetric bilateral lower extremity edema without calf pain. During the presyncope spell, her hands were shaky but no seizure activity. No diplopia, slurred speech or motor deficit. She did have a mild headache after the episode.   Past Medical History:  Diagnosis Date  . Abnormal Pap smear of cervix   . Anemia   . Anxiety and depression   . Asthma   . Atrial fibrillation ([place]) 07/2017  . Borderline diabetes   . CAD (coronary artery disease) ~2009   stent   . Cervical cancer ([place])    "stage 1; had cryotherapy"  . Cervical dysplasia   . Chronic lower back pain   . DDD (degenerative disc disease), lumbar    Dr. Nelva Bush, recommends lumbar epidural steroid injections L5-S1 to the right  . Depression   .  Fibromyalgia   . GERD (gastroesophageal reflux disease)   . Headache    "only when I'm short of breath"  (07/27/2017)  . High cholesterol   . History of hiatal hernia   . History of kidney stones   . Hypertension   . Hypothyroidism   . Insomnia 09/27/2013  . Lichen sclerosus    Vulvar area  . Osteoporosis   . Thyroid disease   . Vitamin D deficiency     Past Surgical History:  Procedure Laterality Date  . CARDIAC CATHETERIZATION  ~ 2008  . CARDIOVERSION N/A 08/05/2017   Procedure: CARDIOVERSION;  Surgeon: Evans Lance, MD;  Location: [facility] [facility];  Service: Cardiovascular;  Laterality: N/A;  . CARDIOVERSION N/A 08/24/2017   Procedure: CARDIOVERSION;  Surgeon: Sanda Klein, MD;  Location: [facility];  Service: Cardiovascular;  Laterality: N/A;  . [place]; ~ 1978/1979; 1984  . ESOPHAGOMYOTOMY  2009  . GYNECOLOGIC CRYOSURGERY  X 2   "stage 1 cancer"  . HERNIA REPAIR  X 6   "all in my stomach" (07/27/2017)  . KNEE ARTHROSCOPY Right   . NEPHRECTOMY Right 1991   in [place], due to fibrosis and lithiasis  . TUBAL LIGATION      Social History   Socioeconomic History  . Marital status: Married    Spouse name: Not on file  . Number of children: 3  .  Years of education: Not on file  . Highest education level: Not on file  Occupational History  . 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 Financial resource strain: Not on file  . Food insecurity:    Worry: Not on file    Inability: Not on file  . Transportation needs:    Medical: Not on file    Non-medical: Not on file  Tobacco Use  . Smoking status: Former Smoker    Packs/day: 2.00    Years: 28.00    Pack years: 56.00    Types: Cigarettes    Last attempt to quit: 2000    Years since quitting: 20.1  . Smokeless tobacco: Never Used  Substance and Sexual Activity  . Alcohol use: Yes    Comment: 07/27/2017 "3-4 drinks//month"  . Drug use: No  . Sexual activity: Not Currently    Comment: 1ST  INTERCOURSE- 16, PARTNERS - 2  Lifestyle  . Physical activity:    Days per week: Not on file    Minutes per session: Not on file  . Stress: Not on file  Relationships  . Social connections:    Talks on phone: Not on file    Gets together: Not on file    Attends religious service: Not on file    Active member of club or organization: Not on file    Attends meetings of clubs or organizations: Not on file    Relationship status: Not on file  . Intimate partner violence:    Fear of current or ex partner: Not on file    Emotionally abused: Not on file    Physically abused: Not on file    Forced sexual activity: Not on file  Other Topics Concern  . Not on file  Social History Narrative   Original from [place]   3 children, 2 alive   Household --pt and husband       Allergies as of 10/17/2018      Reactions   Penicillins Swelling, Rash   Has patient had a PCN reaction causing immediate rash, facial/tongue/throat swelling, SOB or lightheadedness with hypotension: No Has patient had a PCN reaction causing severe rash involving mucus membranes or skin necrosis: No Has patient had a PCN reaction that required hospitalization: No Has patient had a PCN reaction occurring within the last 10 years: No If all of the above answers are "NO", then may proceed with Cephalosporin use.      Medication List       Accurate as of October 17, 2018 11:59 PM. Always use your most recent med list.        allopurinol 100 MG tablet Commonly known as:  ZYLOPRIM Take 0.5 tablets (50 mg total) by mouth daily.   atorvastatin 40 MG tablet Commonly known as:  LIPITOR Take 1 tablet (40 mg total) by mouth at bedtime.   clobetasol cream 0.05 % Commonly known as:  TEMOVATE APPLY TOPICALLY TO AFFECTED AREA(S) ONCE A WEEK   cyclobenzaprine 10 MG tablet Commonly known as:  FLEXERIL Take 1 tablet (10 mg total) by mouth 2 (two) times daily as needed for muscle spasms.   diltiazem 120 MG tablet Commonly  known as:  CARDIZEM Take 0.5 tablets (60 mg total) by mouth 2 (two) times daily.   DULoxetine 60 MG capsule Commonly known as:  CYMBALTA Take 1 capsule (60 mg total) by mouth daily. ** Do NOT chew, crush, or open capsule **   furosemide 20 MG tablet  Commonly known as:  LASIX Take 1 tablet (20 mg total) by mouth daily.   levothyroxine 125 MCG tablet Commonly known as:  SYNTHROID, LEVOTHROID TAKE ONE TABLET BY MOUTH DAILY BEFORE BREAKFAST   metFORMIN 500 MG tablet Commonly known as:  GLUCOPHAGE Take 1 tablet (500 mg total) by mouth 2 (two) times daily with a meal.   omeprazole 20 MG capsule Commonly known as:  PRILOSEC Take 1 capsule (20 mg total) by mouth daily.   raloxifene 60 MG tablet Commonly known as:  EVISTA Take 1 tablet (60 mg total) by mouth daily.   rivaroxaban 20 MG Tabs tablet Commonly known as:  XARELTO Take 1 tablet (20 mg total) by mouth daily with supper.   SOTALOL AF 120 MG Tabs TAKE ONE TABLET BY MOUTH EVERY 12 HOURS   Vitamin D3 125 MCG (5000 UT) Caps Take 1 capsule by mouth daily.   zolpidem 5 MG tablet Commonly known as:  AMBIEN TAKE ONE TABLET BY MOUTH AT BEDTIME AS NEEDED FOR SLEEP           Objective:   Physical Exam BP 126/68 (BP Location: Right Arm, Patient Position: Sitting, Cuff Size: Normal)   Pulse 73   Temp 98.3 F (36.8 C) (Oral)   Resp 16   Ht 5\' 4"  (1.626 m)   Wt 235 lb 6 oz (106.8 kg)   SpO2 96%   BMI 40.40 kg/m  General:   Well developed, NAD, BMI noted.  HEENT:  Normocephalic . Face symmetric, atraumatic Lungs:  CTA B Normal respiratory effort, no intercostal retractions, no accessory muscle use. Heart: RRR,  no murmur.  no pretibial edema bilaterally.  Calves symmetric and not tender Abdomen:  Not distended, soft, non-tender. No rebound or rigidity.   Skin: Not pale. Not jaundice Neurologic:  alert & oriented X3.  Speech normal, gait appropriate for age and unassisted Psych--  Cognition and judgment appear  intact.  Cooperative with normal attention span and concentration.  Behavior appropriate. No anxious or depressed appearing.     Assessment      Assessment   Hyperglycemia HTN High cholesterol Hypothyroidism Anxiety depression, insomnia CV: --Atrial fibrillation DX 06-2017, s/p cardioversions, intolerant to metoprolol (?) --CAD: stent ~ 2009 Morbid obesity: BMI 42 (2011) Vitamin D deficiency, saw Dr. Cruzita Lederer 07-2016, rtc 1 year, celiac dz test (-), unlikely  Malabsortion; cont supplements  GERD H/o achalasia, s/p [place] 2009 SINGLE KIDNEY---S/p R  Nephrectomy ([place] 1991 due to stones/fibrosis) MSK: --Fibromyalgia --DJD  Knee pain R --Back pain -- s/p epidural 10-16 Osteoporosis: Used to be managed by gynecology, now PCP. 08-2014 T score -2.8, 09-2015: T score -2.7.- Was Rx Evista 09-2014  h/o cervical dysplasia Derm: vulvar pruritus, temovate prn ok per gyn  PLAN: Atrial fibrillation, presyncope: As described above, episode happened in the context of forceful nausea and vomiting, vasovagal?. Low suspicious for PE or acute cardiac event. She is currently on Xarelto, sotalol, and in [place] a low-dose of Cardizem was added.  She had elevated BP at that time, BP today is very good. EKG today: Sinus rhythm. We will get a CMP, CBC.  Recommend good hydration and observation.  Continue Cardizem for now. Hyperglycemia: Check a A1c High cholesterol: Check FLP. Hypothyroidism: Check TSH HTN: BP was elevated in the context of a syncope, now on a low-dose diltiazem, no change for now. RTC 4 weeks.

## 2018-10-17 NOTE — Progress Notes (Signed)
Pre visit review using our clinic review tool, if applicable. No additional management support is needed unless otherwise documented below in the visit note. 

## 2018-10-18 NOTE — Assessment & Plan Note (Signed)
Atrial fibrillation, presyncope: As described above, episode happened in the context of forceful nausea and vomiting, vasovagal?. Low suspicious for PE or acute cardiac event. She is currently on Xarelto, sotalol, and in [place] a low-dose of Cardizem was added.  She had elevated BP at that time, BP today is very good. EKG today: Sinus rhythm. We will get a CMP, CBC.  Recommend good hydration and observation.  Continue Cardizem for now. Hyperglycemia: Check a A1c High cholesterol: Check FLP. Hypothyroidism: Check TSH HTN: BP was elevated in the context of a syncope, now on a low-dose diltiazem, no change for now. RTC 4 weeks.

## 2018-10-19 MED ORDER — ATORVASTATIN CALCIUM 40 MG PO TABS: 40.0000 mg | ORAL_TABLET | Freq: Every day | ORAL | 3 refills | Status: DC

## 2018-10-19 NOTE — Addendum Note (Signed)
Addended byDamita Dunnings D on: 10/19/2018 08:09 AM   Modules accepted: Orders

## 2018-10-25 ENCOUNTER — Ambulatory Visit: Payer: Federal, State, Local not specified - PPO | Admitting: Internal Medicine

## 2018-10-31 ENCOUNTER — Encounter: Payer: Self-pay | Admitting: Internal Medicine

## 2018-10-31 ENCOUNTER — Other Ambulatory Visit: Payer: Self-pay

## 2018-10-31 ENCOUNTER — Ambulatory Visit (INDEPENDENT_AMBULATORY_CARE_PROVIDER_SITE_OTHER): Payer: Medicare Other | Admitting: Internal Medicine

## 2018-10-31 VITALS — BP 100/80 | HR 70 | Temp 98.0°F | Resp 18 | Ht 64.0 in | Wt 236.4 lb

## 2018-10-31 DIAGNOSIS — I480 Paroxysmal atrial fibrillation: Secondary | ICD-10-CM | POA: Diagnosis not present

## 2018-10-31 DIAGNOSIS — I10 Essential (primary) hypertension: Secondary | ICD-10-CM

## 2018-10-31 DIAGNOSIS — E785 Hyperlipidemia, unspecified: Secondary | ICD-10-CM

## 2018-10-31 MED ORDER — ATORVASTATIN CALCIUM 40 MG PO TABS: 40.0000 mg | ORAL_TABLET | Freq: Every day | ORAL | 3 refills | Status: DC

## 2018-10-31 NOTE — Patient Instructions (Signed)
  GO TO THE FRONT DESK Schedule your next appointment    for a physical exam by [phone]

## 2018-10-31 NOTE — Progress Notes (Signed)
Subjective:    Patient ID: Melinda Mckinney, female    DOB: Jan 23, 1956, 63 y.o.   MRN: [id number]  DOS:  10/31/2018 Type of visit - description: acute Concern about a coronavirus outbreak, needs a letter for his employer. See last visit, had a presyncope, no further episodes. Atrial fibrillation: On a low-dose of CCB's, BP today slightly low, no ambulatory BPs, she feels very good today.  Review of Systems Denies chest pain no difficulty breathing. No palpitations No lower extremity edema  Past Medical History:  Diagnosis Date  . Abnormal Pap smear of cervix   . Anemia   . Anxiety and depression   . Asthma   . Atrial fibrillation ([place]) 07/2017  . Borderline diabetes   . CAD (coronary artery disease) ~2009   stent   . Cervical cancer ([place])    "stage 1; had cryotherapy"  . Cervical dysplasia   . Chronic lower back pain   . DDD (degenerative disc disease), lumbar    Dr. Nelva Bush, recommends lumbar epidural steroid injections L5-S1 to the right  . Depression   . Fibromyalgia   . GERD (gastroesophageal reflux disease)   . Headache    "only when I'm short of breath"  (07/27/2017)  . High cholesterol   . History of hiatal hernia   . History of kidney stones   . Hypertension   . Hypothyroidism   . Insomnia 09/27/2013  . Lichen sclerosus    Vulvar area  . Osteoporosis   . Thyroid disease   . Vitamin D deficiency     Past Surgical History:  Procedure Laterality Date  . CARDIAC CATHETERIZATION  ~ 2008  . CARDIOVERSION N/A 08/05/2017   Procedure: CARDIOVERSION;  Surgeon: Evans Lance, MD;  Location: [facility];  Service: Cardiovascular;  Laterality: N/A;  . CARDIOVERSION N/A 08/24/2017   Procedure: CARDIOVERSION;  Surgeon: Sanda Klein, MD;  Location: [facility];  Service: Cardiovascular;  Laterality: N/A;  . [place]; ~ 1978/1979; 1984  . ESOPHAGOMYOTOMY  2009  . GYNECOLOGIC CRYOSURGERY  X 2   "stage 1 cancer"  . HERNIA REPAIR  X 6   "all in my stomach"  (07/27/2017)  . KNEE ARTHROSCOPY Right   . NEPHRECTOMY Right 1991   in [place], due to fibrosis and lithiasis  . TUBAL LIGATION      Social History   Socioeconomic History  . Marital status: Married    Spouse name: Not on file  . Number of children: 3  . Years of education: Not on file  . Highest education level: Not on file  Occupational History  . 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 Financial resource strain: Not on file  . Food insecurity:    Worry: Not on file    Inability: Not on file  . Transportation needs:    Medical: Not on file    Non-medical: Not on file  Tobacco Use  . Smoking status: Former Smoker    Packs/day: 2.00    Years: 28.00    Pack years: 56.00    Types: Cigarettes    Last attempt to quit: 2000    Years since quitting: 20.2  . Smokeless tobacco: Never Used  Substance and Sexual Activity  . Alcohol use: Yes    Comment: 07/27/2017 "3-4 drinks//month"  . Drug use: No  . Sexual activity: Not Currently    Comment: 1ST INTERCOURSE- 16, PARTNERS - 2  Lifestyle  . Physical activity:    Days per  week: Not on file    Minutes per session: Not on file  . Stress: Not on file  Relationships  . Social connections:    Talks on phone: Not on file    Gets together: Not on file    Attends religious service: Not on file    Active member of club or organization: Not on file    Attends meetings of clubs or organizations: Not on file    Relationship status: Not on file  . Intimate partner violence:    Fear of current or ex partner: Not on file    Emotionally abused: Not on file    Physically abused: Not on file    Forced sexual activity: Not on file  Other Topics Concern  . Not on file  Social History Narrative   Original from [place]   3 children, 2 alive   Household --pt and husband       Allergies as of 10/31/2018      Reactions   Penicillins Swelling, Rash   Has patient had a PCN reaction causing immediate rash, facial/tongue/throat  swelling, SOB or lightheadedness with hypotension: No Has patient had a PCN reaction causing severe rash involving mucus membranes or skin necrosis: No Has patient had a PCN reaction that required hospitalization: No Has patient had a PCN reaction occurring within the last 10 years: No If all of the above answers are "NO", then may proceed with Cephalosporin use.      Medication List       Accurate as of October 31, 2018  1:15 PM. Always use your most recent med list.        allopurinol 100 MG tablet Commonly known as:  ZYLOPRIM Take 0.5 tablets (50 mg total) by mouth daily.   atorvastatin 40 MG tablet Commonly known as:  LIPITOR Take 1 tablet (40 mg total) by mouth at bedtime.   clobetasol cream 0.05 % Commonly known as:  TEMOVATE APPLY TOPICALLY TO AFFECTED AREA(S) ONCE A WEEK   cyclobenzaprine 10 MG tablet Commonly known as:  FLEXERIL Take 1 tablet (10 mg total) by mouth 2 (two) times daily as needed for muscle spasms.   diltiazem 120 MG tablet Commonly known as:  CARDIZEM Take 0.5 tablets (60 mg total) by mouth 2 (two) times daily.   DULoxetine 60 MG capsule Commonly known as:  CYMBALTA Take 1 capsule (60 mg total) by mouth daily. ** Do NOT chew, crush, or open capsule **   furosemide 20 MG tablet Commonly known as:  LASIX Take 1 tablet (20 mg total) by mouth daily.   levothyroxine 125 MCG tablet Commonly known as:  SYNTHROID, LEVOTHROID TAKE ONE TABLET BY MOUTH DAILY BEFORE BREAKFAST   metFORMIN 500 MG tablet Commonly known as:  GLUCOPHAGE Take 1 tablet (500 mg total) by mouth 2 (two) times daily with a meal.   omeprazole 20 MG capsule Commonly known as:  PRILOSEC Take 1 capsule (20 mg total) by mouth daily.   raloxifene 60 MG tablet Commonly known as:  EVISTA Take 1 tablet (60 mg total) by mouth daily.   rivaroxaban 20 MG Tabs tablet Commonly known as:  Xarelto Take 1 tablet (20 mg total) by mouth daily with supper.   SOTALOL AF 120 MG Tabs TAKE ONE  TABLET BY MOUTH EVERY 12 HOURS   Vitamin D3 125 MCG (5000 UT) Caps Take 1 capsule by mouth daily.   zolpidem 5 MG tablet Commonly known as:  AMBIEN TAKE ONE TABLET BY MOUTH  AT BEDTIME AS NEEDED FOR SLEEP           Objective:   Physical Exam BP 100/80 (BP Location: Left Arm, Patient Position: Sitting, Cuff Size: Large)   Pulse 70   Temp 98 F (36.7 C) (Oral)   Resp 18   Ht 5\' 4"  (1.626 m)   Wt 236 lb 6.4 oz (107.2 kg)   SpO2 98%   BMI 40.58 kg/m  General:   Well developed, NAD, BMI noted. HEENT:  Normocephalic . Face symmetric, atraumatic Lungs:  CTA B Normal respiratory effort, no intercostal retractions, no accessory muscle use. Heart: RRR,  no murmur.  No pretibial edema bilaterally  Skin: Not pale. Not jaundice Neurologic:  alert & oriented X3.  Speech normal, gait appropriate for age and unassisted Psych--  Cognition and judgment appear intact.  Cooperative with normal attention span and concentration.  Behavior appropriate. No anxious or depressed appearing.      Assessment      Assessment   Hyperglycemia HTN High cholesterol Hypothyroidism Anxiety depression, insomnia CV: --Atrial fibrillation DX 06-2017, s/p cardioversions, intolerant to metoprolol (?) --CAD: stent ~ 2009 Morbid obesity: BMI 42 (2011) Vitamin D deficiency, saw Dr. Cruzita Lederer 07-2016, rtc 1 year, celiac dz test (-), unlikely  Malabsortion; cont supplements  GERD H/o achalasia, s/p Heller 2009 SINGLE KIDNEY---S/p R  Nephrectomy ([place] 1991 due to stones/fibrosis) MSK: --Fibromyalgia --DJD  Knee pain R --Back pain -- s/p epidural 10-16 Osteoporosis: Used to be managed by gynecology, now PCP. 08-2014 T score -2.8, 09-2015: T score -2.7.- Was Rx Evista 09-2014  h/o cervical dysplasia Derm: vulvar pruritus, temovate prn ok per gyn  PLAN: Atrial fibrillation, presyncope: Since the last office visit she is essentially asymptomatic, good compliance with medications, no change.  HTN: BP slightly low today, she is asymptomatic.  She has been on furosemide for a while, recently Cardizem was added.  No change, monitor BPs, see AVS High cholesterol: Based on last FLP, was recommended to go back on atorvastatin.  Patient in agreement. Letter regards the patient being high risk for coronavirus provided. RTC 9- 2020, CPX.

## 2018-11-01 NOTE — Assessment & Plan Note (Signed)
Atrial fibrillation, presyncope: Since the last office visit she is essentially asymptomatic, good compliance with medications, no change. HTN: BP slightly low today, she is asymptomatic.  She has been on furosemide for a while, recently Cardizem was added.  No change, monitor BPs, see AVS High cholesterol: Based on last FLP, was recommended to go back on atorvastatin.  Patient in agreement. Letter regards the patient being high risk for coronavirus provided. RTC 9- 2020, CPX.

## 2018-11-03 ENCOUNTER — Other Ambulatory Visit: Payer: Self-pay | Admitting: Internal Medicine

## 2018-11-14 ENCOUNTER — Ambulatory Visit: Payer: Federal, State, Local not specified - PPO | Admitting: Internal Medicine

## 2018-11-22 ENCOUNTER — Other Ambulatory Visit: Payer: Federal, State, Local not specified - PPO

## 2018-12-15 ENCOUNTER — Other Ambulatory Visit: Payer: Self-pay | Admitting: Internal Medicine

## 2018-12-28 ENCOUNTER — Other Ambulatory Visit: Payer: Self-pay | Admitting: Internal Medicine

## 2019-01-08 ENCOUNTER — Telehealth (HOSPITAL_COMMUNITY): Payer: Self-pay | Admitting: *Deleted

## 2019-01-08 NOTE — Telephone Encounter (Signed)
lft msg x 2 for pt to [facility] to reschedule or change appt type.  Pt now sched for in office visit on a day with no provider.  Msg left last week and this week.  Awaiting callback

## 2019-01-09 ENCOUNTER — Ambulatory Visit (HOSPITAL_COMMUNITY): Payer: Federal, State, Local not specified - PPO | Admitting: Physician Assistant

## 2019-01-09 ENCOUNTER — Other Ambulatory Visit: Payer: Self-pay

## 2019-01-10 ENCOUNTER — Telehealth: Payer: Self-pay | Admitting: Internal Medicine

## 2019-01-11 NOTE — Telephone Encounter (Signed)
Sent!

## 2019-01-11 NOTE — Telephone Encounter (Signed)
Ambien refill.   Last OV: 10/31/2018 Last Fill: 10/01/2018 #90 and 0RF UDS: Ambien

## 2019-01-21 ENCOUNTER — Telehealth: Payer: Self-pay | Admitting: Internal Medicine

## 2019-01-21 NOTE — Telephone Encounter (Signed)
Form completed- Pt request that form be mailed to her. Copy of form sent for scanning.

## 2019-01-21 NOTE — Telephone Encounter (Signed)
Pt dropped off document to be filled out by provider (Handicap Parking Placard document-1 page) Pt would like document to be mailed when ready at [address] [place]  [id number] when document done. Document put at front office tray under providers name.

## 2019-01-21 NOTE — Telephone Encounter (Signed)
I have form- okay to complete for this Pt?  

## 2019-01-21 NOTE — Telephone Encounter (Signed)
Form completed- awaiting MD signature.  

## 2019-01-21 NOTE — Telephone Encounter (Signed)
Is ok.

## 2019-01-23 ENCOUNTER — Ambulatory Visit (INDEPENDENT_AMBULATORY_CARE_PROVIDER_SITE_OTHER): Payer: Medicare Other | Admitting: Internal Medicine

## 2019-01-23 DIAGNOSIS — I10 Essential (primary) hypertension: Secondary | ICD-10-CM | POA: Diagnosis not present

## 2019-01-23 DIAGNOSIS — R42 Dizziness and giddiness: Secondary | ICD-10-CM

## 2019-01-23 DIAGNOSIS — E785 Hyperlipidemia, unspecified: Secondary | ICD-10-CM | POA: Diagnosis not present

## 2019-01-23 DIAGNOSIS — L304 Erythema intertrigo: Secondary | ICD-10-CM

## 2019-01-23 MED ORDER — CLOBETASOL PROPIONATE 0.05 % EX CREA: TOPICAL_CREAM | CUTANEOUS | 2 refills | Status: DC

## 2019-01-23 MED ORDER — METFORMIN HCL 500 MG PO TABS: 500.0000 mg | ORAL_TABLET | Freq: Two times a day (BID) | ORAL | 0 refills | Status: DC

## 2019-01-23 MED ORDER — LISINOPRIL 10 MG PO TABS: 10.0000 mg | ORAL_TABLET | Freq: Every day | ORAL | 2 refills | Status: DC

## 2019-01-23 NOTE — Assessment & Plan Note (Signed)
Hyperglycemia: Refill metformin.  Last A1c satisfactory High cholesterol: Last LDL elevated at 118 despite taking atorvastatin daily.She reports she was not eating healthy at the time but since then she is doing better. Check a FLP on RTC HTN:  continue Lasix, self restarted lisinopril, see HPI, will send a prescription Headache, dizziness: Sxs happening approximately  5 weeks ago, resolved, I am somewhat concerned about her sxs, needs to be seen in person, will schedule Dermatitis, vulvar pruritus: RF Temovate. Plan: In person visit next week for a clinical exam and labs CPX already scheduled for September 2020

## 2019-01-23 NOTE — Progress Notes (Signed)
Subjective:    Patient ID: Melinda Mckinney, female    DOB: 03-14-1956, 63 y.o.   MRN: [id number]  DOS:  01/23/2019 Type of visit - description: Virtual Visit via Video Note  I connected with@ on 01/23/19 at 10:40 AM EDT by a video enabled telemedicine application and verified that I am speaking with the correct person using two identifiers.   THIS ENCOUNTER IS A VIRTUAL VISIT DUE TO COVID-19 - PATIENT WAS NOT SEEN IN THE OFFICE. PATIENT HAS CONSENTED TO VIRTUAL VISIT / TELEMEDICINE VISIT   Location of patient: home  Location of provider: office  I discussed the limitations of evaluation and management by telemedicine and the availability of in person appointments. The patient expressed understanding and agreed to proceed.  History of Present Illness: Follow-up DM, doing well, needs refills Chronic dermatitis: Needs a refill on Temovate High cholesterol: Not well controlled, see last FLP, she reports good compliance with atorvastatin but admits to poor diet.  Since then, she has corrected her diet.  HTN: self restarted lisinopril d/t   elevated BP noted ~ 5 weeks ago in the context of HA and dizziness (saxslargely resolved )    Review of Systems Denies fever chills No chest pain no difficulty breathing Aches and pains from fibromyalgia at baseline. No cough  Past Medical History:  Diagnosis Date  . Abnormal Pap smear of cervix   . Anemia   . Anxiety and depression   . Asthma   . Atrial fibrillation ([place]) 07/2017  . Borderline diabetes   . CAD (coronary artery disease) ~2009   stent   . Cervical cancer ([place])    "stage 1; had cryotherapy"  . Cervical dysplasia   . Chronic lower back pain   . DDD (degenerative disc disease), lumbar    Dr. Nelva Bush, recommends lumbar epidural steroid injections L5-S1 to the right  . Depression   . Fibromyalgia   . GERD (gastroesophageal reflux disease)   . Headache    "only when I'm short of breath"  (07/27/2017)  . High cholesterol   .  History of hiatal hernia   . History of kidney stones   . Hypertension   . Hypothyroidism   . Insomnia 09/27/2013  . Lichen sclerosus    Vulvar area  . Osteoporosis   . Thyroid disease   . Vitamin D deficiency     Past Surgical History:  Procedure Laterality Date  . CARDIAC CATHETERIZATION  ~ 2008  . CARDIOVERSION N/A 08/05/2017   Procedure: CARDIOVERSION;  Surgeon: Evans Lance, MD;  Location: [facility];  Service: Cardiovascular;  Laterality: N/A;  . CARDIOVERSION N/A 08/24/2017   Procedure: CARDIOVERSION;  Surgeon: Sanda Klein, MD;  Location: [facility];  Service: Cardiovascular;  Laterality: N/A;  . [place]; ~ 1978/1979; 1984  . ESOPHAGOMYOTOMY  2009  . GYNECOLOGIC CRYOSURGERY  X 2   "stage 1 cancer"  . HERNIA REPAIR  X 6   "all in my stomach" (07/27/2017)  . KNEE ARTHROSCOPY Right   . NEPHRECTOMY Right 1991   in [place], due to fibrosis and lithiasis  . TUBAL LIGATION      Social History   Socioeconomic History  . Marital status: Married    Spouse name: Not on file  . Number of children: 3  . Years of education: Not on file  . Highest education level: Not on file  Occupational History  . 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 Financial resource strain: Not on  file  . Food insecurity:    Worry: Not on file    Inability: Not on file  . Transportation needs:    Medical: Not on file    Non-medical: Not on file  Tobacco Use  . Smoking status: Former Smoker    Packs/day: 2.00    Years: 28.00    Pack years: 56.00    Types: Cigarettes    Last attempt to quit: 2000    Years since quitting: 20.4  . Smokeless tobacco: Never Used  Substance and Sexual Activity  . Alcohol use: Yes    Comment: 07/27/2017 "3-4 drinks//month"  . Drug use: No  . Sexual activity: Not Currently    Comment: 1ST INTERCOURSE- 16, PARTNERS - 2  Lifestyle  . Physical activity:    Days per week: Not on file    Minutes per session: Not on file  . Stress: Not on file   Relationships  . Social connections:    Talks on phone: Not on file    Gets together: Not on file    Attends religious service: Not on file    Active member of club or organization: Not on file    Attends meetings of clubs or organizations: Not on file    Relationship status: Not on file  . Intimate partner violence:    Fear of current or ex partner: Not on file    Emotionally abused: Not on file    Physically abused: Not on file    Forced sexual activity: Not on file  Other Topics Concern  . Not on file  Social History Narrative   Original from [place]   3 children, 2 alive   Household --pt and husband       Allergies as of 01/23/2019      Reactions   Penicillins Swelling, Rash   Has patient had a PCN reaction causing immediate rash, facial/tongue/throat swelling, SOB or lightheadedness with hypotension: No Has patient had a PCN reaction causing severe rash involving mucus membranes or skin necrosis: No Has patient had a PCN reaction that required hospitalization: No Has patient had a PCN reaction occurring within the last 10 years: No If all of the above answers are "NO", then may proceed with Cephalosporin use.      Medication List       Accurate as of January 23, 2019  5:17 PM. If you have any questions, ask your nurse or doctor.        allopurinol 100 MG tablet Commonly known as:  ZYLOPRIM Take 0.5 tablets (50 mg total) by mouth daily.   atorvastatin 40 MG tablet Commonly known as:  LIPITOR Take 1 tablet (40 mg total) by mouth at bedtime.   clobetasol cream 0.05 % Commonly known as:  TEMOVATE APPLY TOPICALLY TO AFFECTED AREA(S) ONCE A WEEK   cyclobenzaprine 10 MG tablet Commonly known as:  FLEXERIL Take 1 tablet (10 mg total) by mouth 2 (two) times daily as needed for muscle spasms.   diltiazem 120 MG tablet Commonly known as:  CARDIZEM Take 0.5 tablets (60 mg total) by mouth 2 (two) times daily.   DULoxetine 60 MG capsule Commonly known as:  CYMBALTA  Take 1 capsule (60 mg total) by mouth daily. ** Do NOT chew, crush, or open capsule **   furosemide 20 MG tablet Commonly known as:  LASIX Take 1 tablet (20 mg total) by mouth daily.   levothyroxine 125 MCG tablet Commonly known as:  SYNTHROID TAKE ONE TABLET BY  MOUTH DAILY BEFORE BREAKFAST   lisinopril 10 MG tablet Commonly known as:  ZESTRIL Take 1 tablet (10 mg total) by mouth daily.   metFORMIN 500 MG tablet Commonly known as:  GLUCOPHAGE Take 1 tablet (500 mg total) by mouth 2 (two) times daily with a meal.   omeprazole 20 MG capsule Commonly known as:  PRILOSEC Take 1 capsule (20 mg total) by mouth daily.   raloxifene 60 MG tablet Commonly known as:  EVISTA Take 1 tablet (60 mg total) by mouth daily.   rivaroxaban 20 MG Tabs tablet Commonly known as:  Xarelto Take 1 tablet (20 mg total) by mouth daily with supper.   SOTALOL AF 120 MG Tabs TAKE ONE TABLET BY MOUTH EVERY 12 HOURS   Vitamin D3 125 MCG (5000 UT) Caps Take 1 capsule by mouth daily.   zolpidem 5 MG tablet Commonly known as:  AMBIEN Take 1 tablet (5 mg total) by mouth at bedtime as needed. for sleep           Objective:   Physical Exam There were no vitals taken for this visit. This is a virtual video visit.  Alert oriented x3, no apparent distress    Assessment     Assessment   Hyperglycemia HTN High cholesterol Hypothyroidism Anxiety depression, insomnia CV: --Atrial fibrillation DX 06-2017, s/p cardioversions, intolerant to metoprolol (?) --CAD: stent ~ 2009 Morbid obesity: BMI 42 (2011) Vitamin D deficiency, saw Dr. Cruzita Lederer 07-2016, rtc 1 year, celiac dz test (-), unlikely  Malabsortion; cont supplements  GERD H/o achalasia, s/p Heller 2009 SINGLE KIDNEY---S/p R  Nephrectomy ([place] 1991 due to stones/fibrosis) MSK: --Fibromyalgia --DJD  Knee pain R --Back pain -- s/p epidural 10-16 Osteoporosis: Used to be managed by gynecology, now PCP. 08-2014 T score -2.8, 09-2015: T  score -2.7.- Was Rx Evista 09-2014  h/o cervical dysplasia Derm: vulvar pruritus, temovate prn ok per gyn  PLAN: Hyperglycemia: Refill metformin.  Last A1c satisfactory High cholesterol: Last LDL elevated at 118 despite taking atorvastatin daily.She reports she was not eating healthy at the time but since then she is doing better. Check a FLP on RTC HTN:  continue Lasix, self restarted lisinopril, see HPI, will send a prescription Headache, dizziness: Sxs happening approximately  5 weeks ago, resolved, I am somewhat concerned about her sxs, needs to be seen in person, will schedule Dermatitis, vulvar pruritus: RF Temovate. Plan: In person visit next week for a clinical exam and labs CPX already scheduled for September 2020      I discussed the assessment and treatment plan with the patient. The patient was provided an opportunity to ask questions and all were answered. The patient agreed with the plan and demonstrated an understanding of the instructions.   The patient was advised to call back or seek an in-person evaluation if the symptoms worsen or if the condition fails to improve as anticipated.

## 2019-01-28 ENCOUNTER — Other Ambulatory Visit: Payer: Self-pay | Admitting: Internal Medicine

## 2019-01-30 ENCOUNTER — Ambulatory Visit (HOSPITAL_BASED_OUTPATIENT_CLINIC_OR_DEPARTMENT_OTHER)
Admission: RE | Admit: 2019-01-30 | Discharge: 2019-01-30 | Disposition: A | Discharge status: Home / Self Care | Payer: Medicare Other | Source: Ambulatory Visit | Attending: Internal Medicine | Admitting: Internal Medicine

## 2019-01-30 ENCOUNTER — Other Ambulatory Visit: Payer: Self-pay

## 2019-01-30 ENCOUNTER — Encounter: Payer: Self-pay | Admitting: Internal Medicine

## 2019-01-30 ENCOUNTER — Ambulatory Visit (INDEPENDENT_AMBULATORY_CARE_PROVIDER_SITE_OTHER): Payer: Medicare Other | Admitting: Internal Medicine

## 2019-01-30 VITALS — BP 100/59 | HR 70 | Temp 98.9°F | Resp 16 | Ht 64.0 in | Wt 248.2 lb

## 2019-01-30 DIAGNOSIS — I10 Essential (primary) hypertension: Secondary | ICD-10-CM | POA: Diagnosis not present

## 2019-01-30 DIAGNOSIS — G44209 Tension-type headache, unspecified, not intractable: Secondary | ICD-10-CM | POA: Insufficient documentation

## 2019-01-30 DIAGNOSIS — R51 Headache: Secondary | ICD-10-CM | POA: Diagnosis not present

## 2019-01-30 DIAGNOSIS — R42 Dizziness and giddiness: Secondary | ICD-10-CM | POA: Diagnosis not present

## 2019-01-30 LAB — BASIC METABOLIC PANEL
BUN: 16 mg/dL (ref 6–23)
CO2: 25 mEq/L (ref 19–32)
Calcium: 9 mg/dL (ref 8.4–10.5)
Chloride: 106 mEq/L (ref 96–112)
Creatinine, Ser: 0.79 mg/dL (ref 0.40–1.20)
GFR: 72.98 mL/min (ref >60.00)
Glucose, Bld: 98 mg/dL (ref 70–99)
Potassium: 3.9 mEq/L (ref 3.5–5.1)
Sodium: 140 mEq/L (ref 135–145)

## 2019-01-30 NOTE — Progress Notes (Signed)
Pre visit review using our clinic review tool, if applicable. No additional management support is needed unless otherwise documented below in the visit note. 

## 2019-01-30 NOTE — Patient Instructions (Signed)
GO TO THE LAB : Get the blood work      STOP BY THE FIRST FLOOR:  get the cat scan   Check the  blood pressure   daily Be sure your blood pressure is between 110/65 and  135/85. If it is consistently higher or lower, let me know

## 2019-01-30 NOTE — Progress Notes (Signed)
Subjective:    Patient ID: Melinda Mckinney, female    DOB: 1956/01/18, 63 y.o.   MRN: [id number]  DOS:  01/30/2019 Type of visit - description: Acute visit, here at my request The last time I saw her was virtually she complained of headache and dizziness.  I asked her to come in for an in person visit.  6 weeks ago, she woke up with a headache, started on the front of her head and went to the back, it was associated  with moderate to severe dizziness and photophobia. The dizziness was worse when she move and symptoms decreased when she laid down on her bed on a dark room. She has a history of migraines, last episode was years ago ;  the headache this time was different from previous migraines.  That day, her husband checked her BP and it was 149/106.  She put herself back on lisinopril. Approximately 3 days later she was essentially symptom-free.  She continue checking her blood pressure in the last few days has a stabilized at around 130/80.  Review of Systems Denies fever chills No nausea or vomiting No abdominal pain no blood in the stools No strokelike symptoms such as diplopia, slurred speech, motor deficits;  no facial numbness I asked  about neck stiffness, she denies although she also said that she has some discomfort at the posterior neck.  Past Medical History:  Diagnosis Date  . Abnormal Pap smear of cervix   . Anemia   . Anxiety and depression   . Asthma   . Atrial fibrillation ([place]) 07/2017  . Borderline diabetes   . CAD (coronary artery disease) ~2009   stent   . Cervical cancer ([place])    "stage 1; had cryotherapy"  . Cervical dysplasia   . Chronic lower back pain   . DDD (degenerative disc disease), lumbar    Dr. Nelva Bush, recommends lumbar epidural steroid injections L5-S1 to the right  . Depression   . Fibromyalgia   . GERD (gastroesophageal reflux disease)   . Headache    "only when I'm short of breath"  (07/27/2017)  . High cholesterol   . History of hiatal  hernia   . History of kidney stones   . Hypertension   . Hypothyroidism   . Insomnia 09/27/2013  . Lichen sclerosus    Vulvar area  . Osteoporosis   . Thyroid disease   . Vitamin D deficiency     Past Surgical History:  Procedure Laterality Date  . CARDIAC CATHETERIZATION  ~ 2008  . CARDIOVERSION N/A 08/05/2017   Procedure: CARDIOVERSION;  Surgeon: Evans Lance, MD;  Location: [facility];  Service: Cardiovascular;  Laterality: N/A;  . CARDIOVERSION N/A 08/24/2017   Procedure: CARDIOVERSION;  Surgeon: Sanda Klein, MD;  Location: [facility];  Service: Cardiovascular;  Laterality: N/A;  . [place]; ~ 1978/1979; 1984  . ESOPHAGOMYOTOMY  2009  . GYNECOLOGIC CRYOSURGERY  X 2   "stage 1 cancer"  . HERNIA REPAIR  X 6   "all in my stomach" (07/27/2017)  . KNEE ARTHROSCOPY Right   . NEPHRECTOMY Right 1991   in [place], due to fibrosis and lithiasis  . TUBAL LIGATION      Social History   Socioeconomic History  . Marital status: Married    Spouse name: Not on file  . Number of children: 3  . Years of education: Not on file  . Highest education level: Not on file  Occupational History  .  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 Financial resource strain: Not on file  . Food insecurity    Worry: Not on file    Inability: Not on file  . Transportation needs    Medical: Not on file    Non-medical: Not on file  Tobacco Use  . Smoking status: Former Smoker    Packs/day: 2.00    Years: 28.00    Pack years: 56.00    Types: Cigarettes    Quit date: 2000    Years since quitting: 20.4  . Smokeless tobacco: Never Used  Substance and Sexual Activity  . Alcohol use: Yes    Comment: 07/27/2017 "3-4 drinks//month"  . Drug use: No  . Sexual activity: Not Currently    Comment: 1ST INTERCOURSE- 16, PARTNERS - 2  Lifestyle  . Physical activity    Days per week: Not on file    Minutes per session: Not on file  . Stress: Not on file  Relationships  . Social  Herbalist on phone: Not on file    Gets together: Not on file    Attends religious service: Not on file    Active member of club or organization: Not on file    Attends meetings of clubs or organizations: Not on file    Relationship status: Not on file  . Intimate partner violence    Fear of current or ex partner: Not on file    Emotionally abused: Not on file    Physically abused: Not on file    Forced sexual activity: Not on file  Other Topics Concern  . Not on file  Social History Narrative   Original from [place]   3 children, 2 alive   Household --pt and husband       Allergies as of 01/30/2019      Reactions   Penicillins Swelling, Rash   Has patient had a PCN reaction causing immediate rash, facial/tongue/throat swelling, SOB or lightheadedness with hypotension: No Has patient had a PCN reaction causing severe rash involving mucus membranes or skin necrosis: No Has patient had a PCN reaction that required hospitalization: No Has patient had a PCN reaction occurring within the last 10 years: No If all of the above answers are "NO", then may proceed with Cephalosporin use.      Medication List       Accurate as of January 30, 2019  2:04 PM. If you have any questions, ask your nurse or doctor.        allopurinol 100 MG tablet Commonly known as: ZYLOPRIM Take 0.5 tablets (50 mg total) by mouth daily.   atorvastatin 40 MG tablet Commonly known as: LIPITOR Take 1 tablet (40 mg total) by mouth at bedtime.   clobetasol cream 0.05 % Commonly known as: TEMOVATE APPLY TOPICALLY TO AFFECTED AREA(S) ONCE A WEEK   cyclobenzaprine 10 MG tablet Commonly known as: FLEXERIL Take 1 tablet (10 mg total) by mouth 2 (two) times daily as needed for muscle spasms.   diltiazem 120 MG tablet Commonly known as: CARDIZEM Take 0.5 tablets (60 mg total) by mouth 2 (two) times daily.   DULoxetine 60 MG capsule Commonly known as: CYMBALTA Take 1 capsule (60 mg total) by  mouth daily.   furosemide 20 MG tablet Commonly known as: LASIX Take 1 tablet (20 mg total) by mouth daily.   levothyroxine 125 MCG tablet Commonly known as: SYNTHROID TAKE ONE TABLET BY MOUTH DAILY BEFORE BREAKFAST  lisinopril 10 MG tablet Commonly known as: ZESTRIL Take 1 tablet (10 mg total) by mouth daily.   metFORMIN 500 MG tablet Commonly known as: GLUCOPHAGE Take 1 tablet (500 mg total) by mouth 2 (two) times daily with a meal.   omeprazole 20 MG capsule Commonly known as: PRILOSEC Take 1 capsule (20 mg total) by mouth daily.   raloxifene 60 MG tablet Commonly known as: EVISTA Take 1 tablet (60 mg total) by mouth daily.   rivaroxaban 20 MG Tabs tablet Commonly known as: Xarelto Take 1 tablet (20 mg total) by mouth daily with supper.   SOTALOL AF 120 MG Tabs TAKE ONE TABLET BY MOUTH EVERY 12 HOURS   Vitamin D3 125 MCG (5000 UT) Caps Take 1 capsule by mouth daily.   zolpidem 5 MG tablet Commonly known as: AMBIEN Take 1 tablet (5 mg total) by mouth at bedtime as needed. for sleep           Objective:   Physical Exam BP (!) 100/59 (BP Location: Left Arm, Patient Position: Sitting, Cuff Size: Normal)   Pulse 70   Temp 98.9 F (37.2 C) (Oral)   Resp 16   Ht 5\' 4"  (1.626 m)   Wt 248 lb 4 oz (112.6 kg)   SpO2 98%   BMI 42.61 kg/m  General:   Well developed, NAD, BMI noted. HEENT:  Normocephalic . Face symmetric, atraumatic Neck: Normal range of motion Lungs:  CTA B Normal respiratory effort, no intercostal retractions, no accessory muscle use. Heart: RRR,  no murmur.  No pretibial edema bilaterally  Skin: Not pale. Not jaundice Neurologic:  alert & oriented X3.  Speech normal, gait appropriate for age and unassisted. EOMI.  DTR symmetric. Psych--  Cognition and judgment appear intact.  Cooperative with normal attention span and concentration.  Behavior appropriate. No anxious or depressed appearing.      Assessment     Assessment    Hyperglycemia HTN High cholesterol Hypothyroidism Anxiety depression, insomnia CV: --Atrial fibrillation DX 06-2017, s/p cardioversions, intolerant to metoprolol (?) --CAD: stent ~ 2009 Morbid obesity: BMI 42 (2011) Vitamin D deficiency, saw Dr. Cruzita Lederer 07-2016, rtc 1 year, celiac dz test (-), unlikely  Malabsortion; cont supplements  GERD H/o achalasia, s/p Heller 2009 SINGLE KIDNEY---S/p R  Nephrectomy ([place] 1991 due to stones/fibrosis) MSK: --Fibromyalgia --DJD  Knee pain R --Back pain -- s/p epidural 10-16 Osteoporosis: Used to be managed by gynecology, now PCP. 08-2014 T score -2.8, 09-2015: T score -2.7.- Was Rx Evista 09-2014  h/o cervical dysplasia Derm: vulvar pruritus, temovate prn ok per gyn  PLAN:  Headache, dizziness: A single episode few weeks ago, neurological exam is now normal, symptoms happening  in the context of diabetes. I am concerned because she is anticoagulated. Plan: CT head, if she had a bleed which is my concern, it is possible we will be able to see some residual blood.  If negative will recommend observation HTN: Currently on Lasix and recently self  restarted lisinopril, see HPI.  BP today is low at this office but at home is in the 130/80 range. We will continue with present care and check a BMP RTC in 3 months as already scheduled.

## 2019-01-31 NOTE — Assessment & Plan Note (Signed)
Headache, dizziness: A single episode few weeks ago, neurological exam is now normal, symptoms happening  in the context of diabetes. I am concerned because she is anticoagulated. Plan: CT head, if she had a bleed which is my concern, it is possible we will be able to see some residual blood.  If negative will recommend observation HTN: Currently on Lasix and recently self  restarted lisinopril, see HPI.  BP today is low at this office but at home is in the 130/80 range. We will continue with present care and check a BMP RTC in 3 months as already scheduled.

## 2019-02-11 ENCOUNTER — Telehealth: Payer: Self-pay | Admitting: Internal Medicine

## 2019-02-11 NOTE — Telephone Encounter (Signed)
Pt came in office stating is needing a letter for work indicating pt is a high risk due to her health and can not come to work until further notice, due to covid-19. If any question please call pt at [phone]. Please advise. 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

## 2019-02-11 NOTE — Telephone Encounter (Signed)
Paz pt

## 2019-02-11 NOTE — Telephone Encounter (Signed)
Please advise 

## 2019-02-11 NOTE — Telephone Encounter (Signed)
Letter released to mychart.

## 2019-02-11 NOTE — Telephone Encounter (Signed)
Agree, she is high risk, please write a letter

## 2019-03-19 DIAGNOSIS — E119 Type 2 diabetes mellitus without complications: Secondary | ICD-10-CM | POA: Diagnosis not present

## 2019-03-19 DIAGNOSIS — H524 Presbyopia: Secondary | ICD-10-CM | POA: Diagnosis not present

## 2019-03-19 DIAGNOSIS — H2513 Age-related nuclear cataract, bilateral: Secondary | ICD-10-CM | POA: Diagnosis not present

## 2019-03-19 DIAGNOSIS — G510 Bell's palsy: Secondary | ICD-10-CM | POA: Diagnosis not present

## 2019-03-19 LAB — HM DIABETES EYE EXAM

## 2019-03-21 ENCOUNTER — Encounter: Payer: Self-pay | Admitting: Internal Medicine

## 2019-03-25 ENCOUNTER — Other Ambulatory Visit: Payer: Self-pay | Admitting: Internal Medicine

## 2019-03-26 DIAGNOSIS — N904 Leukoplakia of vulva: Secondary | ICD-10-CM | POA: Diagnosis not present

## 2019-03-26 DIAGNOSIS — Z1151 Encounter for screening for human papillomavirus (HPV): Secondary | ICD-10-CM | POA: Diagnosis not present

## 2019-03-26 DIAGNOSIS — Z01419 Encounter for gynecological examination (general) (routine) without abnormal findings: Secondary | ICD-10-CM | POA: Diagnosis not present

## 2019-03-26 DIAGNOSIS — Z124 Encounter for screening for malignant neoplasm of cervix: Secondary | ICD-10-CM | POA: Diagnosis not present

## 2019-03-28 ENCOUNTER — Other Ambulatory Visit: Payer: Self-pay | Admitting: Internal Medicine

## 2019-03-29 ENCOUNTER — Other Ambulatory Visit: Payer: Self-pay | Admitting: Internal Medicine

## 2019-04-29 ENCOUNTER — Ambulatory Visit (INDEPENDENT_AMBULATORY_CARE_PROVIDER_SITE_OTHER): Payer: Medicare Other | Admitting: Internal Medicine

## 2019-04-29 ENCOUNTER — Other Ambulatory Visit: Payer: Self-pay

## 2019-04-29 ENCOUNTER — Encounter: Payer: Self-pay | Admitting: Internal Medicine

## 2019-04-29 DIAGNOSIS — J069 Acute upper respiratory infection, unspecified: Secondary | ICD-10-CM | POA: Diagnosis not present

## 2019-04-29 NOTE — Assessment & Plan Note (Signed)
URI: Symptoms started 2 weeks ago, malaise, myalgias, no fever chills. We talk about possibly testing for COVID-19 but since SX started  2 weeks ago already we elected to hold off. We will continue with supportive treatment including rest, fluids, Coricidin. Call if not gradually back to normal or if there is any sudden increase in symptoms. Her husband is also my patient, he started with aches and malaise 2 days ago, no fever.  He will call me if symptoms increase. Recommend strict quarantine precautions in the next few days.

## 2019-04-29 NOTE — Progress Notes (Signed)
Subjective:    Patient ID: Melinda Mckinney, female    DOB: July 11, 1956, 63 y.o.   MRN: [id number]  DOS:  04/29/2019 Type of visit - description: Attempted  to make this a video visit, due to technical difficulties from the patient side it was not possible  thus we proceeded with a Virtual Visit via Telephone    I connected with@   by telephone and verified that I am speaking with the correct person using two identifiers.  THIS ENCOUNTER IS A VIRTUAL VISIT DUE TO COVID-19 - PATIENT WAS NOT SEEN IN THE OFFICE. PATIENT HAS CONSENTED TO VIRTUAL VISIT / TELEMEDICINE VISIT   Location of patient: home  Location of provider: office  I discussed the limitations, risks, security and privacy concerns of performing an evaluation and management service by telephone and the availability of in person appointments. I also discussed with the patient that there may be a patient responsible charge related to this service. The patient expressed understanding and agreed to proceed.   History of Present Illness: Acute visit Symptoms started 2 weeks ago with mild general aches and malaise. She has been taking Coricidin OTC which help with the symptoms. She ran out of Coricidin and for the last 2 to 3 days symptoms are more noticeable.    Review of Systems She checks her temperature: No fever.  Denies chills She has mild headache No nausea, vomiting, diarrhea Minimal cough with a small amount of yellow sputum. No chest pain no difficulty breathing  Past Medical History:  Diagnosis Date  . Abnormal Pap smear of cervix   . Anemia   . Anxiety and depression   . Asthma   . Atrial fibrillation ([place]) 07/2017  . Borderline diabetes   . CAD (coronary artery disease) ~2009   stent   . Cervical cancer ([place])    "stage 1; had cryotherapy"  . Cervical dysplasia   . Chronic lower back pain   . DDD (degenerative disc disease), lumbar    Dr. Nelva Bush, recommends lumbar epidural steroid injections L5-S1 to the  right  . Depression   . Fibromyalgia   . GERD (gastroesophageal reflux disease)   . Headache    "only when I'm short of breath"  (07/27/2017)  . High cholesterol   . History of hiatal hernia   . History of kidney stones   . Hypertension   . Hypothyroidism   . Insomnia 09/27/2013  . Lichen sclerosus    Vulvar area  . Osteoporosis   . Thyroid disease   . Vitamin D deficiency     Past Surgical History:  Procedure Laterality Date  . CARDIAC CATHETERIZATION  ~ 2008  . CARDIOVERSION N/A 08/05/2017   Procedure: CARDIOVERSION;  Surgeon: Evans Lance, MD;  Location: [facility];  Service: Cardiovascular;  Laterality: N/A;  . CARDIOVERSION N/A 08/24/2017   Procedure: CARDIOVERSION;  Surgeon: Sanda Klein, MD;  Location: [facility];  Service: Cardiovascular;  Laterality: N/A;  . [place]; ~ 1978/1979; 1984  . ESOPHAGOMYOTOMY  2009  . GYNECOLOGIC CRYOSURGERY  X 2   "stage 1 cancer"  . HERNIA REPAIR  X 6   "all in my stomach" (07/27/2017)  . KNEE ARTHROSCOPY Right   . NEPHRECTOMY Right 1991   in [place], due to fibrosis and lithiasis  . TUBAL LIGATION      Social History   Socioeconomic History  . Marital status: Married    Spouse name: Not on file  . Number of children: 3  .  Years of education: Not on file  . Highest education level: Not on file  Occupational History  . 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 Financial resource strain: Not on file  . Food insecurity    Worry: Not on file    Inability: Not on file  . Transportation needs    Medical: Not on file    Non-medical: Not on file  Tobacco Use  . Smoking status: Former Smoker    Packs/day: 2.00    Years: 28.00    Pack years: 56.00    Types: Cigarettes    Quit date: 2000    Years since quitting: 20.7  . Smokeless tobacco: Never Used  Substance and Sexual Activity  . Alcohol use: Yes    Comment: 07/27/2017 "3-4 drinks//month"  . Drug use: No  . Sexual activity: Not Currently    Comment:  1ST INTERCOURSE- 16, PARTNERS - 2  Lifestyle  . Physical activity    Days per week: Not on file    Minutes per session: Not on file  . Stress: Not on file  Relationships  . Social Herbalist on phone: Not on file    Gets together: Not on file    Attends religious service: Not on file    Active member of club or organization: Not on file    Attends meetings of clubs or organizations: Not on file    Relationship status: Not on file  . Intimate partner violence    Fear of current or ex partner: Not on file    Emotionally abused: Not on file    Physically abused: Not on file    Forced sexual activity: Not on file  Other Topics Concern  . Not on file  Social History Narrative   Original from [place]   3 children, 2 alive   Household --pt and husband       Allergies as of 04/29/2019      Reactions   Penicillins Swelling, Rash   Has patient had a PCN reaction causing immediate rash, facial/tongue/throat swelling, SOB or lightheadedness with hypotension: No Has patient had a PCN reaction causing severe rash involving mucus membranes or skin necrosis: No Has patient had a PCN reaction that required hospitalization: No Has patient had a PCN reaction occurring within the last 10 years: No If all of the above answers are "NO", then may proceed with Cephalosporin use.      Medication List       Accurate as of April 29, 2019 10:24 AM. If you have any questions, ask your nurse or doctor.        allopurinol 100 MG tablet Commonly known as: ZYLOPRIM Take 0.5 tablets (50 mg total) by mouth daily.   atorvastatin 40 MG tablet Commonly known as: LIPITOR Take 1 tablet (40 mg total) by mouth at bedtime.   clobetasol cream 0.05 % Commonly known as: TEMOVATE APPLY TOPICALLY TO AFFECTED AREA(S) ONCE A WEEK   cyclobenzaprine 10 MG tablet Commonly known as: FLEXERIL Take 1 tablet (10 mg total) by mouth 2 (two) times daily as needed for muscle spasms.   diltiazem 120  MG tablet Commonly known as: CARDIZEM TAKE 1/2 TABLET BY MOUTH TWICE DAILY   DULoxetine 60 MG capsule Commonly known as: CYMBALTA Take 1 capsule (60 mg total) by mouth daily.   furosemide 20 MG tablet Commonly known as: LASIX Take 1 tablet (20 mg total) by mouth daily.   levothyroxine 125 MCG tablet  Commonly known as: SYNTHROID TAKE ONE TABLET BY MOUTH DAILY BEFORE BREAKFAST   lisinopril 10 MG tablet Commonly known as: ZESTRIL Take 1 tablet (10 mg total) by mouth daily.   metFORMIN 500 MG tablet Commonly known as: GLUCOPHAGE Take 1 tablet (500 mg total) by mouth 2 (two) times daily with a meal.   omeprazole 20 MG capsule Commonly known as: PRILOSEC Take 1 capsule (20 mg total) by mouth daily.   raloxifene 60 MG tablet Commonly known as: EVISTA Take 1 tablet (60 mg total) by mouth daily.   rivaroxaban 20 MG Tabs tablet Commonly known as: Xarelto Take 1 tablet (20 mg total) by mouth daily with supper.   SOTALOL AF 120 MG Tabs TAKE ONE TABLET BY MOUTH EVERY 12 HOURS   Vitamin D3 125 MCG (5000 UT) Caps Take 1 capsule by mouth daily.   zolpidem 5 MG tablet Commonly known as: AMBIEN Take 1 tablet (5 mg total) by mouth at bedtime as needed. for sleep           Objective:   Physical Exam There were no vitals taken for this visit. The virtual visit started with a video, she looked in no distress.  Due to technical difficulties with reach to phone visit, alert oriented x3, no distress, speaking in complete sentences    Assessment      Assessment   Hyperglycemia HTN High cholesterol Hypothyroidism Anxiety depression, insomnia CV: --Atrial fibrillation DX 06-2017, s/p cardioversions, intolerant to metoprolol (?) --CAD: stent ~ 2009 Morbid obesity: BMI 42 (2011) Vitamin D deficiency, saw Dr. Cruzita Lederer 07-2016, rtc 1 year, celiac dz test (-), unlikely  Malabsortion; cont supplements  GERD H/o achalasia, s/p Heller 2009 SINGLE KIDNEY---S/p R  Nephrectomy ([place] 1991 due to stones/fibrosis) MSK: --Fibromyalgia --DJD  Knee pain R --Back pain -- s/p epidural 10-16 Osteoporosis: Used to be managed by gynecology, now PCP. 08-2014 T score -2.8, 09-2015: T score -2.7.- Was Rx Evista 09-2014  h/o cervical dysplasia Derm: vulvar pruritus, temovate prn ok per gyn  PLAN:  URI: Symptoms started 2 weeks ago, malaise, myalgias, no fever chills. We talk about possibly testing for COVID-19 but since SX started  2 weeks ago already we elected to hold off. We will continue with supportive treatment including rest, fluids, Coricidin. Call if not gradually back to normal or if there is any sudden increase in symptoms. Her husband is also my patient, he started with aches and malaise 2 days ago, no fever.  He will call me if symptoms increase. Recommend strict quarantine precautions in the next few days.

## 2019-04-30 ENCOUNTER — Encounter (HOSPITAL_BASED_OUTPATIENT_CLINIC_OR_DEPARTMENT_OTHER): Payer: Self-pay | Admitting: Emergency Medicine

## 2019-04-30 ENCOUNTER — Other Ambulatory Visit: Payer: Self-pay

## 2019-04-30 ENCOUNTER — Inpatient Hospital Stay (HOSPITAL_BASED_OUTPATIENT_CLINIC_OR_DEPARTMENT_OTHER)
Admission: EM | Admit: 2019-04-30 | Discharge: 2019-05-02 | DRG: 178 | Disposition: A | Discharge status: Home / Self Care | Payer: Medicare Other | Attending: Family Medicine | Admitting: Family Medicine

## 2019-04-30 ENCOUNTER — Emergency Department (HOSPITAL_BASED_OUTPATIENT_CLINIC_OR_DEPARTMENT_OTHER): Payer: Medicare Other

## 2019-04-30 DIAGNOSIS — R69 Illness, unspecified: Secondary | ICD-10-CM

## 2019-04-30 DIAGNOSIS — M5136 Other intervertebral disc degeneration, lumbar region: Secondary | ICD-10-CM | POA: Diagnosis not present

## 2019-04-30 DIAGNOSIS — R197 Diarrhea, unspecified: Secondary | ICD-10-CM | POA: Diagnosis not present

## 2019-04-30 DIAGNOSIS — I110 Hypertensive heart disease with heart failure: Secondary | ICD-10-CM | POA: Diagnosis not present

## 2019-04-30 DIAGNOSIS — Z79890 Hormone replacement therapy (postmenopausal): Secondary | ICD-10-CM | POA: Diagnosis not present

## 2019-04-30 DIAGNOSIS — Z7984 Long term (current) use of oral hypoglycemic drugs: Secondary | ICD-10-CM | POA: Diagnosis not present

## 2019-04-30 DIAGNOSIS — I482 Chronic atrial fibrillation, unspecified: Secondary | ICD-10-CM | POA: Diagnosis present

## 2019-04-30 DIAGNOSIS — Z833 Family history of diabetes mellitus: Secondary | ICD-10-CM | POA: Diagnosis not present

## 2019-04-30 DIAGNOSIS — I2510 Atherosclerotic heart disease of native coronary artery without angina pectoris: Secondary | ICD-10-CM | POA: Diagnosis not present

## 2019-04-30 DIAGNOSIS — Z8249 Family history of ischemic heart disease and other diseases of the circulatory system: Secondary | ICD-10-CM | POA: Diagnosis not present

## 2019-04-30 DIAGNOSIS — U071 COVID-19: Secondary | ICD-10-CM | POA: Diagnosis not present

## 2019-04-30 DIAGNOSIS — Z8541 Personal history of malignant neoplasm of cervix uteri: Secondary | ICD-10-CM

## 2019-04-30 DIAGNOSIS — E1165 Type 2 diabetes mellitus with hyperglycemia: Secondary | ICD-10-CM | POA: Diagnosis present

## 2019-04-30 DIAGNOSIS — E669 Obesity, unspecified: Secondary | ICD-10-CM | POA: Diagnosis present

## 2019-04-30 DIAGNOSIS — E860 Dehydration: Secondary | ICD-10-CM | POA: Diagnosis present

## 2019-04-30 DIAGNOSIS — I5022 Chronic systolic (congestive) heart failure: Secondary | ICD-10-CM | POA: Diagnosis present

## 2019-04-30 DIAGNOSIS — E785 Hyperlipidemia, unspecified: Secondary | ICD-10-CM | POA: Diagnosis not present

## 2019-04-30 DIAGNOSIS — E7800 Pure hypercholesterolemia, unspecified: Secondary | ICD-10-CM | POA: Diagnosis not present

## 2019-04-30 DIAGNOSIS — R739 Hyperglycemia, unspecified: Secondary | ICD-10-CM | POA: Diagnosis not present

## 2019-04-30 DIAGNOSIS — E039 Hypothyroidism, unspecified: Secondary | ICD-10-CM | POA: Diagnosis not present

## 2019-04-30 DIAGNOSIS — E559 Vitamin D deficiency, unspecified: Secondary | ICD-10-CM | POA: Diagnosis present

## 2019-04-30 DIAGNOSIS — Z823 Family history of stroke: Secondary | ICD-10-CM

## 2019-04-30 DIAGNOSIS — M810 Age-related osteoporosis without current pathological fracture: Secondary | ICD-10-CM | POA: Diagnosis not present

## 2019-04-30 DIAGNOSIS — Z880 Allergy status to penicillin: Secondary | ICD-10-CM

## 2019-04-30 DIAGNOSIS — I4891 Unspecified atrial fibrillation: Secondary | ICD-10-CM | POA: Diagnosis present

## 2019-04-30 DIAGNOSIS — Z87891 Personal history of nicotine dependence: Secondary | ICD-10-CM | POA: Diagnosis not present

## 2019-04-30 DIAGNOSIS — Z801 Family history of malignant neoplasm of trachea, bronchus and lung: Secondary | ICD-10-CM | POA: Diagnosis not present

## 2019-04-30 DIAGNOSIS — I959 Hypotension, unspecified: Secondary | ICD-10-CM | POA: Diagnosis present

## 2019-04-30 DIAGNOSIS — Z803 Family history of malignant neoplasm of breast: Secondary | ICD-10-CM

## 2019-04-30 DIAGNOSIS — I10 Essential (primary) hypertension: Secondary | ICD-10-CM | POA: Diagnosis not present

## 2019-04-30 DIAGNOSIS — K219 Gastro-esophageal reflux disease without esophagitis: Secondary | ICD-10-CM | POA: Diagnosis present

## 2019-04-30 DIAGNOSIS — I340 Nonrheumatic mitral (valve) insufficiency: Secondary | ICD-10-CM | POA: Diagnosis present

## 2019-04-30 DIAGNOSIS — Z7901 Long term (current) use of anticoagulants: Secondary | ICD-10-CM

## 2019-04-30 DIAGNOSIS — M1A9XX0 Chronic gout, unspecified, without tophus (tophi): Secondary | ICD-10-CM | POA: Diagnosis present

## 2019-04-30 DIAGNOSIS — Z6839 Body mass index (BMI) 39.0-39.9, adult: Secondary | ICD-10-CM

## 2019-04-30 DIAGNOSIS — M797 Fibromyalgia: Secondary | ICD-10-CM | POA: Diagnosis not present

## 2019-04-30 DIAGNOSIS — R509 Fever, unspecified: Secondary | ICD-10-CM | POA: Diagnosis not present

## 2019-04-30 DIAGNOSIS — G4700 Insomnia, unspecified: Secondary | ICD-10-CM | POA: Diagnosis not present

## 2019-04-30 DIAGNOSIS — Z955 Presence of coronary angioplasty implant and graft: Secondary | ICD-10-CM

## 2019-04-30 LAB — PROTIME-INR
INR: 1.6 — ABNORMAL HIGH (ref 0.8–1.2)
Prothrombin Time: 18.7 seconds — ABNORMAL HIGH (ref 11.4–15.2)

## 2019-04-30 LAB — CBC WITH DIFFERENTIAL/PLATELET
Abs Immature Granulocytes: 0.06 10*3/uL (ref 0.00–0.07)
Basophils Absolute: 0 10*3/uL (ref 0.0–0.1)
Basophils Relative: 0 %
Eosinophils Absolute: 0.1 10*3/uL (ref 0.0–0.5)
Eosinophils Relative: 1 %
HCT: 42.9 % (ref 36.0–46.0)
Hemoglobin: 13.3 g/dL (ref 12.0–15.0)
Immature Granulocytes: 1 %
Lymphocytes Relative: 25 %
Lymphs Abs: 1.3 10*3/uL (ref 0.7–4.0)
MCH: 28.7 pg (ref 26.0–34.0)
MCHC: 31 g/dL (ref 30.0–36.0)
MCV: 92.7 fL (ref 80.0–100.0)
Monocytes Absolute: 0.3 10*3/uL (ref 0.1–1.0)
Monocytes Relative: 6 %
Neutro Abs: 3.4 10*3/uL (ref 1.7–7.7)
Neutrophils Relative %: 67 %
Platelets: 279 10*3/uL (ref 150–400)
RBC: 4.63 MIL/uL (ref 3.87–5.11)
RDW: 14.5 % (ref 11.5–15.5)
WBC: 5.1 10*3/uL (ref 4.0–10.5)
nRBC: 0 % (ref 0.0–0.2)

## 2019-04-30 LAB — COMPREHENSIVE METABOLIC PANEL
ALT: 23 U/L (ref 0–44)
AST: 30 U/L (ref 15–41)
Albumin: 3.4 g/dL — ABNORMAL LOW (ref 3.5–5.0)
Alkaline Phosphatase: 91 U/L (ref 38–126)
Anion gap: 13 (ref 5–15)
BUN: 12 mg/dL (ref 8–23)
CO2: 21 mmol/L — ABNORMAL LOW (ref 22–32)
Calcium: 8.6 mg/dL — ABNORMAL LOW (ref 8.9–10.3)
Chloride: 103 mmol/L (ref 98–111)
Creatinine, Ser: 0.91 mg/dL (ref 0.44–1.00)
GFR calc Af Amer: >60 mL/min (ref >60)
GFR calc non Af Amer: >60 mL/min (ref >60)
Glucose, Bld: 116 mg/dL — ABNORMAL HIGH (ref 70–99)
Potassium: 3.7 mmol/L (ref 3.5–5.1)
Sodium: 137 mmol/L (ref 135–145)
Total Bilirubin: 0.7 mg/dL (ref 0.3–1.2)
Total Protein: 7.1 g/dL (ref 6.5–8.1)

## 2019-04-30 LAB — TROPONIN I (HIGH SENSITIVITY)
Troponin I (High Sensitivity): 5 ng/L (ref <18)
Troponin I (High Sensitivity): 5 ng/L (ref <18)

## 2019-04-30 LAB — C-REACTIVE PROTEIN: CRP: 6.6 mg/dL — ABNORMAL HIGH (ref <1.0)

## 2019-04-30 LAB — D-DIMER, QUANTITATIVE: D-Dimer, Quant: 0.44 ug/mL-FEU (ref 0.00–0.50)

## 2019-04-30 LAB — LACTIC ACID, PLASMA
Lactic Acid, Venous: 2.1 mmol/L (ref 0.5–1.9)
Lactic Acid, Venous: 1.2 mmol/L (ref 0.5–1.9)

## 2019-04-30 LAB — TRIGLYCERIDES: Triglycerides: 96 mg/dL (ref <150)

## 2019-04-30 LAB — BRAIN NATRIURETIC PEPTIDE: B Natriuretic Peptide: 214.3 pg/mL — ABNORMAL HIGH (ref 0.0–100.0)

## 2019-04-30 LAB — FERRITIN: Ferritin: 69 ng/mL (ref 11–307)

## 2019-04-30 LAB — PROCALCITONIN: Procalcitonin: <0.1 ng/mL

## 2019-04-30 LAB — LACTATE DEHYDROGENASE: LDH: 196 U/L — ABNORMAL HIGH (ref 98–192)

## 2019-04-30 LAB — LIPASE, BLOOD: Lipase: 21 U/L (ref 11–51)

## 2019-04-30 LAB — FIBRINOGEN: Fibrinogen: 618 mg/dL — ABNORMAL HIGH (ref 210–475)

## 2019-04-30 LAB — PHOSPHORUS: Phosphorus: 1.7 mg/dL — ABNORMAL LOW (ref 2.5–4.6)

## 2019-04-30 LAB — SARS CORONAVIRUS 2 BY RT PCR (HOSPITAL ORDER, PERFORMED IN ~~LOC~~ HOSPITAL LAB): SARS Coronavirus 2: POSITIVE — AB

## 2019-04-30 LAB — MAGNESIUM: Magnesium: 1.8 mg/dL (ref 1.7–2.4)

## 2019-04-30 MED ORDER — SODIUM CHLORIDE 0.9 % IV BOLUS
250.0000 mL | Freq: Once | INTRAVENOUS | Status: AC
  Administered 2019-04-30: 250 mL via INTRAVENOUS

## 2019-04-30 MED ORDER — K PHOS MONO-SOD PHOS DI & MONO 155-852-130 MG PO TABS
500.0000 mg | ORAL_TABLET | Freq: Three times a day (TID) | ORAL | Status: DC
  Filled 2019-04-30: qty 2

## 2019-04-30 MED ORDER — ACETAMINOPHEN 500 MG PO TABS
1000.0000 mg | ORAL_TABLET | Freq: Once | ORAL | Status: AC
  Administered 2019-04-30: 1000 mg via ORAL
  Filled 2019-04-30: qty 2

## 2019-04-30 MED ORDER — SODIUM CHLORIDE 0.9 % IV SOLN
1000.0000 mL | INTRAVENOUS | Status: DC
  Administered 2019-04-30: 1000 mL via INTRAVENOUS

## 2019-04-30 MED ORDER — FAMOTIDINE IN NACL 20-0.9 MG/50ML-% IV SOLN
20.0000 mg | Freq: Once | INTRAVENOUS | Status: AC
  Administered 2019-04-30: 20 mg via INTRAVENOUS
  Filled 2019-04-30: qty 50

## 2019-04-30 MED ORDER — MORPHINE SULFATE (PF) 4 MG/ML IV SOLN
4.0000 mg | Freq: Once | INTRAVENOUS | Status: AC
  Administered 2019-04-30: 4 mg via INTRAVENOUS
  Filled 2019-04-30: qty 1

## 2019-04-30 MED ORDER — ONDANSETRON HCL 4 MG/2ML IJ SOLN
4.0000 mg | Freq: Once | INTRAMUSCULAR | Status: AC
  Administered 2019-04-30: 4 mg via INTRAVENOUS
  Filled 2019-04-30: qty 2

## 2019-04-30 NOTE — ED Triage Notes (Signed)
Fever body aches for 10 days. No sob, c/o diarrhea. Husband with pt translating .

## 2019-04-30 NOTE — ED Notes (Signed)
Right forearm piv site infiltrated- swelling noted- IV d/c'ed. Attempt x 2 to restart piv in left forearm and left lateral AC unsuccessful. Left AC 20g IV has good blood return and flushes without difficulty

## 2019-04-30 NOTE — ED Notes (Signed)
ED Provider at bedside. 

## 2019-04-30 NOTE — ED Notes (Signed)
Date and time results received: 04/30/19 1912 (use smartphrase ".now" to insert current time)  Test: lactic acid Critical Value: 2.1  Name of Provider Notified: Dr. Johnney Killian  Orders Received? Or Actions Taken?: no new orders

## 2019-04-30 NOTE — ED Provider Notes (Signed)
[facility] EMERGENCY DEPARTMENT Provider Note   CSN: [id number] Arrival date & time: 04/30/19  1525     History   Chief Complaint Chief Complaint  Patient presents with  . Fever    HPI Melinda Mckinney is a 63 y.o. female.     HPI Patient started to get sick about 9 days ago.  Symptoms first started like a cold.  There was some congestion and coughing but symptoms were not severe.  She however did not improve.  She reports she has had increased coughing with production of a white sputum.  She is experiencing some chest pain with cough and feeling short of breath with activity.  Patient reports now over about the past day or 2 she is extremely achy all over.  She is also started to develop some pain in her right upper quadrant that is very intense.  She reports she has been having a small amount of diarrhea. Past Medical History:  Diagnosis Date  . Abnormal Pap smear of cervix   . Anemia   . Anxiety and depression   . Asthma   . Atrial fibrillation ([place]) 07/2017  . Borderline diabetes   . CAD (coronary artery disease) ~2009   stent   . Cervical cancer ([place])    "stage 1; had cryotherapy"  . Cervical dysplasia   . Chronic lower back pain   . DDD (degenerative disc disease), lumbar    Dr. Nelva Bush, recommends lumbar epidural steroid injections L5-S1 to the right  . Depression   . Fibromyalgia   . GERD (gastroesophageal reflux disease)   . Headache    "only when I'm short of breath"  (07/27/2017)  . High cholesterol   . History of hiatal hernia   . History of kidney stones   . Hypertension   . Hypothyroidism   . Insomnia 09/27/2013  . Lichen sclerosus    Vulvar area  . Osteoporosis   . Thyroid disease   . Vitamin D deficiency     Patient Active Problem List   Diagnosis Date Noted  . Encounter for monitoring sotalol therapy 08/03/2017  . Acute systolic heart failure ([place]) 07/27/2017  . Acute congestive heart failure ([place]) 07/27/2017  . Precordial pain   .  Atrial fibrillation ([place]) 06/29/2017  . Hyperglycemia 01/06/2017  . PCP NOTES >>>> 06/16/2015  . Back pain 02/05/2015  . Hypothyroidism 01/19/2015  . Vitamin D deficiency   . Vulvar atrophy 08/20/2014  . Annual physical exam 07/24/2014  . Anemia 07/24/2014  . Insomnia 09/27/2013  . Intertrigo 09/27/2013  . Urolithiasis 09/27/2013  . Essential hypertension   . GERD, h/o achalasia s/p heller ~2009)   . Hyperlipidemia LDL goal <70   . Coronary artery disease involving native coronary artery of native heart with angina pectoris ([place])   . Osteoporosis   . Fibromyalgia     Past Surgical History:  Procedure Laterality Date  . CARDIAC CATHETERIZATION  ~ 2008  . CARDIOVERSION N/A 08/05/2017   Procedure: CARDIOVERSION;  Surgeon: Evans Lance, MD;  Location: [facility];  Service: Cardiovascular;  Laterality: N/A;  . CARDIOVERSION N/A 08/24/2017   Procedure: CARDIOVERSION;  Surgeon: Sanda Klein, MD;  Location: [facility];  Service: Cardiovascular;  Laterality: N/A;  . [place]; ~ 1978/1979; 1984  . ESOPHAGOMYOTOMY  2009  . GYNECOLOGIC CRYOSURGERY  X 2   "stage 1 cancer"  . HERNIA REPAIR  X 6   "all in my stomach" (07/27/2017)  . KNEE ARTHROSCOPY Right   .  NEPHRECTOMY Right 1991   in [place], due to fibrosis and lithiasis  . TUBAL LIGATION       OB History    Gravida  4   Para  2   Term      Preterm      AB  2   Living  2     SAB  2   TAB      Ectopic      Multiple      Live Births               Home Medications    Prior to Admission medications   Medication Sig Start Date End Date Taking? Authorizing Provider  allopurinol (ZYLOPRIM) 100 MG tablet Take 0.5 tablets (50 mg total) by mouth daily. 12/28/18   Colon Branch, MD  atorvastatin (LIPITOR) 40 MG tablet Take 1 tablet (40 mg total) by mouth at bedtime. 10/31/18   Colon Branch, MD  Cholecalciferol (VITAMIN D3) 5000 UNITS CAPS Take 1 capsule by mouth daily.     [provider]   clobetasol cream (TEMOVATE) 0.05 % APPLY TOPICALLY TO AFFECTED AREA(S) ONCE A WEEK 01/23/19   Colon Branch, MD  cyclobenzaprine (FLEXERIL) 10 MG tablet Take 1 tablet (10 mg total) by mouth 2 (two) times daily as needed for muscle spasms. 10/01/18   Colon Branch, MD  diltiazem (CARDIZEM) 120 MG tablet TAKE 1/2 TABLET BY MOUTH TWICE DAILY 03/29/19   Colon Branch, MD  DULoxetine (CYMBALTA) 60 MG capsule Take 1 capsule (60 mg total) by mouth daily. 01/28/19   Colon Branch, MD  furosemide (LASIX) 20 MG tablet Take 1 tablet (20 mg total) by mouth daily. 12/17/18   Colon Branch, MD  levothyroxine (SYNTHROID, LEVOTHROID) 125 MCG tablet TAKE ONE TABLET BY MOUTH DAILY BEFORE BREAKFAST 09/12/18   Philemon Kingdom, MD  lisinopril (ZESTRIL) 10 MG tablet Take 1 tablet (10 mg total) by mouth daily. 01/23/19   Colon Branch, MD  metFORMIN (GLUCOPHAGE) 500 MG tablet Take 1 tablet (500 mg total) by mouth 2 (two) times daily with a meal. 01/23/19   Colon Branch, MD  omeprazole (PRILOSEC) 20 MG capsule Take 1 capsule (20 mg total) by mouth daily. 08/20/18   Colon Branch, MD  raloxifene (EVISTA) 60 MG tablet Take 1 tablet (60 mg total) by mouth daily. 09/08/16   Terrance Mass, MD  rivaroxaban (XARELTO) 20 MG TABS tablet Take 1 tablet (20 mg total) by mouth daily with supper. 11/05/18   Colon Branch, MD  SOTALOL AF 120 MG TABS TAKE ONE TABLET BY MOUTH EVERY 12 HOURS 10/02/18   Allred, Jeneen Rinks, MD  zolpidem (AMBIEN) 5 MG tablet Take 1 tablet (5 mg total) by mouth at bedtime as needed. for sleep 01/11/19   Colon Branch, MD    Family History Family History  Problem Relation Age of Onset  . Breast cancer Other        aunt   . CAD Brother   . Lung cancer Mother        F and M  . Diabetes Father        F and mother   . CAD Father   . Lung cancer Father   . Stroke Sister   . Colon cancer Neg Hx     Social History Social History   Tobacco Use  . Smoking status: Former Smoker    Packs/day: 2.00    Years:  28.00    Pack years: 56.00     Types: Cigarettes    Quit date: 2000    Years since quitting: 20.7  . Smokeless tobacco: Never Used  Substance Use Topics  . Alcohol use: Yes    Comment: 07/27/2017 "3-4 drinks//month"  . Drug use: No     Allergies   Penicillins   Review of Systems Review of Systems 10 Systems reviewed and are negative for acute change except as noted in the HPI.   Physical Exam Updated Vital Signs BP (!) 144/107 (BP Location: Right Arm)   Pulse 68   Temp (!) 101.1 F (38.4 C) (Oral)   Resp 20   Ht 5\' 6"  (1.676 m)   Wt 95.3 kg   SpO2 98%   BMI 33.89 kg/m   Physical Exam Constitutional:      Comments: Patient is alert and appropriate.  She is moderately ill and uncomfortable in appearance.  No respiratory distress at rest.  HENT:     Head: Normocephalic and atraumatic.     Mouth/Throat:     Mouth: Mucous membranes are moist.     Pharynx: Oropharynx is clear.  Eyes:     Extraocular Movements: Extraocular movements intact.     Conjunctiva/sclera: Conjunctivae normal.  Cardiovascular:     Comments: Heart is not tachycardic.  Sounds grossly regular.  No gross murmur or gallop Pulmonary:     Effort: Pulmonary effort is normal.     Breath sounds: Normal breath sounds.  Abdominal:     Comments: Abdomen is soft.  Moderate to severe right upper quadrant tenderness lower quadrants are nontender.  Musculoskeletal: Normal range of motion.        General: No swelling or tenderness.     Right lower leg: No edema.     Left lower leg: No edema.     Comments: No peripheral edema.  Condition of lower legs and feet is very good without wounds or cellulitis.  Skin:    General: Skin is warm and dry.  Neurological:     General: No focal deficit present.     Mental Status: She is oriented to person, place, and time.     Coordination: Coordination normal.  Psychiatric:        Mood and Affect: Mood normal.      ED Treatments / Results  Labs (all labs ordered are listed, but only  abnormal results are displayed) Labs Reviewed  SARS CORONAVIRUS 2 ([facility])  CULTURE, BLOOD (ROUTINE X 2)  CULTURE, BLOOD (ROUTINE X 2)  LACTIC ACID, PLASMA  LACTIC ACID, PLASMA  CBC WITH DIFFERENTIAL/PLATELET  COMPREHENSIVE METABOLIC PANEL  D-DIMER, QUANTITATIVE (NOT AT [facility])  PROCALCITONIN  LACTATE DEHYDROGENASE  FERRITIN  TRIGLYCERIDES  FIBRINOGEN  C-REACTIVE PROTEIN  BRAIN NATRIURETIC PEPTIDE  LIPASE, BLOOD  TROPONIN I (HIGH SENSITIVITY)    EKG EKG Interpretation  Date/Time:  Tuesday April 30 2019 17:52:14 EDT Ventricular Rate:  139 PR Interval:    QRS Duration: 83 QT Interval:  302 QTC Calculation: 460 R Axis:   18 Text Interpretation:  Atrial fibrillation Minimal ST depression, anterolateral leads agree, no acute ischemic changes compared to old Confirmed by Charlesetta Shanks [phone]) on 04/30/2019 6:42:34 PM   Radiology No results found.  Procedures Procedures (including critical care time) CRITICAL CARE Performed by: Charlesetta Shanks   Total critical care time: 30 minutes  Critical care time was exclusive of separately billable procedures and treating other patients.  Critical  care was necessary to treat or prevent imminent or life-threatening deterioration.  Critical care was time spent personally by me on the following activities: development of treatment plan with patient and/or surrogate as well as nursing, discussions with consultants, evaluation of patient's response to treatment, examination of patient, obtaining history from patient or surrogate, ordering and performing treatments and interventions, ordering and review of laboratory studies, ordering and review of radiographic studies, pulse oximetry and re-evaluation of patient's condition. Medications Ordered in ED Medications  0.9 %  sodium chloride infusion (has no administration in time range)  morphine 4 MG/ML injection 4 mg (has no administration  in time range)  famotidine (PEPCID) IVPB 20 mg premix (has no administration in time range)  ondansetron (ZOFRAN) injection 4 mg (has no administration in time range)     Initial Impression / Assessment and Plan / ED Course  I have reviewed the triage vital signs and the nursing notes.  Pertinent labs & imaging results that were available during my care of the patient were reviewed by me and considered in my medical decision making (see chart for details).  Clinical Course as of Apr 30 2051  Tue Apr 30, 2019  2051 Consult: Reviewed with Dr. Olevia Bowens for admission to [facility].   [MP]    Clinical Course User Index [MP] Charlesetta Shanks, MD      Patient presents as outlined.  She has had symptoms starting 9 days ago and now has had significant increase in shortness of breath, cough, generalized pain, weakness and vomiting.  Patient does have known history of atrial fibrillation.  She has been hydrated in small aliquots.  She is tolerating this well.  At this time, she is not requiring oxygen.  Pain is improved with morphine.  Plan will be for admission to [facility] for positive COVID-19 with severe comorbid illness.    Final Clinical Impressions(s) / ED Diagnoses   Final diagnoses:  COVID-19  Severe comorbid illness    ED Discharge Orders    None       Charlesetta Shanks, MD 04/30/19 2053

## 2019-04-30 NOTE — ED Triage Notes (Signed)
PT took Tylenol at home 1 hour pta. C/O feeling week and sob with walking.

## 2019-05-01 ENCOUNTER — Other Ambulatory Visit: Payer: Self-pay

## 2019-05-01 DIAGNOSIS — I10 Essential (primary) hypertension: Secondary | ICD-10-CM | POA: Diagnosis not present

## 2019-05-01 DIAGNOSIS — Z801 Family history of malignant neoplasm of trachea, bronchus and lung: Secondary | ICD-10-CM | POA: Diagnosis not present

## 2019-05-01 DIAGNOSIS — R509 Fever, unspecified: Secondary | ICD-10-CM | POA: Diagnosis not present

## 2019-05-01 DIAGNOSIS — Z823 Family history of stroke: Secondary | ICD-10-CM | POA: Diagnosis not present

## 2019-05-01 DIAGNOSIS — E559 Vitamin D deficiency, unspecified: Secondary | ICD-10-CM | POA: Diagnosis not present

## 2019-05-01 DIAGNOSIS — I2510 Atherosclerotic heart disease of native coronary artery without angina pectoris: Secondary | ICD-10-CM | POA: Diagnosis present

## 2019-05-01 DIAGNOSIS — E039 Hypothyroidism, unspecified: Secondary | ICD-10-CM | POA: Diagnosis not present

## 2019-05-01 DIAGNOSIS — Z880 Allergy status to penicillin: Secondary | ICD-10-CM | POA: Diagnosis not present

## 2019-05-01 DIAGNOSIS — K219 Gastro-esophageal reflux disease without esophagitis: Secondary | ICD-10-CM | POA: Diagnosis not present

## 2019-05-01 DIAGNOSIS — J988 Other specified respiratory disorders: Secondary | ICD-10-CM | POA: Diagnosis not present

## 2019-05-01 DIAGNOSIS — Z803 Family history of malignant neoplasm of breast: Secondary | ICD-10-CM | POA: Diagnosis not present

## 2019-05-01 DIAGNOSIS — Z8541 Personal history of malignant neoplasm of cervix uteri: Secondary | ICD-10-CM | POA: Diagnosis not present

## 2019-05-01 DIAGNOSIS — Z8249 Family history of ischemic heart disease and other diseases of the circulatory system: Secondary | ICD-10-CM | POA: Diagnosis not present

## 2019-05-01 DIAGNOSIS — E785 Hyperlipidemia, unspecified: Secondary | ICD-10-CM | POA: Diagnosis not present

## 2019-05-01 DIAGNOSIS — M797 Fibromyalgia: Secondary | ICD-10-CM | POA: Diagnosis present

## 2019-05-01 DIAGNOSIS — I110 Hypertensive heart disease with heart failure: Secondary | ICD-10-CM | POA: Diagnosis present

## 2019-05-01 DIAGNOSIS — I482 Chronic atrial fibrillation, unspecified: Secondary | ICD-10-CM | POA: Diagnosis present

## 2019-05-01 DIAGNOSIS — M810 Age-related osteoporosis without current pathological fracture: Secondary | ICD-10-CM | POA: Diagnosis present

## 2019-05-01 DIAGNOSIS — Z7901 Long term (current) use of anticoagulants: Secondary | ICD-10-CM | POA: Diagnosis not present

## 2019-05-01 DIAGNOSIS — E7800 Pure hypercholesterolemia, unspecified: Secondary | ICD-10-CM | POA: Diagnosis present

## 2019-05-01 DIAGNOSIS — Z833 Family history of diabetes mellitus: Secondary | ICD-10-CM | POA: Diagnosis not present

## 2019-05-01 DIAGNOSIS — R739 Hyperglycemia, unspecified: Secondary | ICD-10-CM | POA: Diagnosis not present

## 2019-05-01 DIAGNOSIS — Z87891 Personal history of nicotine dependence: Secondary | ICD-10-CM | POA: Diagnosis not present

## 2019-05-01 DIAGNOSIS — U071 COVID-19: Secondary | ICD-10-CM | POA: Diagnosis present

## 2019-05-01 DIAGNOSIS — Z7984 Long term (current) use of oral hypoglycemic drugs: Secondary | ICD-10-CM | POA: Diagnosis not present

## 2019-05-01 DIAGNOSIS — I480 Paroxysmal atrial fibrillation: Secondary | ICD-10-CM | POA: Diagnosis not present

## 2019-05-01 DIAGNOSIS — G4700 Insomnia, unspecified: Secondary | ICD-10-CM | POA: Diagnosis present

## 2019-05-01 DIAGNOSIS — M5136 Other intervertebral disc degeneration, lumbar region: Secondary | ICD-10-CM | POA: Diagnosis present

## 2019-05-01 DIAGNOSIS — Z79890 Hormone replacement therapy (postmenopausal): Secondary | ICD-10-CM | POA: Diagnosis not present

## 2019-05-01 DIAGNOSIS — I5022 Chronic systolic (congestive) heart failure: Secondary | ICD-10-CM | POA: Diagnosis present

## 2019-05-01 LAB — GLUCOSE, CAPILLARY
Glucose-Capillary: 100 mg/dL — ABNORMAL HIGH (ref 70–99)
Glucose-Capillary: 154 mg/dL — ABNORMAL HIGH (ref 70–99)
Glucose-Capillary: 184 mg/dL — ABNORMAL HIGH (ref 70–99)
Glucose-Capillary: 253 mg/dL — ABNORMAL HIGH (ref 70–99)
Glucose-Capillary: 221 mg/dL — ABNORMAL HIGH (ref 70–99)

## 2019-05-01 LAB — ABO/RH: ABO/RH(D): A POS

## 2019-05-01 LAB — TSH: TSH: 1.467 u[IU]/mL (ref 0.350–4.500)

## 2019-05-01 MED ORDER — ATORVASTATIN CALCIUM 40 MG PO TABS
40.0000 mg | ORAL_TABLET | Freq: Every day | ORAL | Status: DC
  Administered 2019-05-01: 22:00:00 40 mg via ORAL
  Filled 2019-05-01: qty 1
  Filled 2019-05-01: qty 4

## 2019-05-01 MED ORDER — K PHOS MONO-SOD PHOS DI & MONO 155-852-130 MG PO TABS
500.0000 mg | ORAL_TABLET | Freq: Three times a day (TID) | ORAL | Status: AC
  Administered 2019-05-01 (×3): 500 mg via ORAL
  Filled 2019-05-01 (×3): qty 2

## 2019-05-01 MED ORDER — ZOLPIDEM TARTRATE 5 MG PO TABS: 5.0000 mg | ORAL_TABLET | Freq: Every evening | ORAL | Status: DC | PRN

## 2019-05-01 MED ORDER — ALLOPURINOL 100 MG PO TABS
100.0000 mg | ORAL_TABLET | Freq: Every day | ORAL | Status: DC
  Administered 2019-05-01 – 2019-05-02 (×2): 100 mg via ORAL
  Filled 2019-05-01 (×2): qty 1

## 2019-05-01 MED ORDER — RIVAROXABAN 20 MG PO TABS
20.0000 mg | ORAL_TABLET | Freq: Every day | ORAL | Status: DC
  Administered 2019-05-01: 20 mg via ORAL
  Filled 2019-05-01 (×3): qty 1

## 2019-05-01 MED ORDER — PANTOPRAZOLE SODIUM 40 MG PO TBEC
40.0000 mg | DELAYED_RELEASE_TABLET | Freq: Every day | ORAL | Status: DC
  Administered 2019-05-01 – 2019-05-02 (×2): 40 mg via ORAL
  Filled 2019-05-01 (×2): qty 1

## 2019-05-01 MED ORDER — VITAMIN D3 125 MCG (5000 UT) PO CAPS: 1.0000 | ORAL_CAPSULE | Freq: Every day | ORAL | Status: DC

## 2019-05-01 MED ORDER — ACETAMINOPHEN 325 MG PO TABS
650.0000 mg | ORAL_TABLET | ORAL | Status: DC | PRN
  Administered 2019-05-01: 650 mg via ORAL
  Filled 2019-05-01: qty 2

## 2019-05-01 MED ORDER — IPRATROPIUM-ALBUTEROL 20-100 MCG/ACT IN AERS
1.0000 | INHALATION_SPRAY | Freq: Four times a day (QID) | RESPIRATORY_TRACT | Status: DC | PRN
  Filled 2019-05-01: qty 4

## 2019-05-01 MED ORDER — GUAIFENESIN-DM 100-10 MG/5ML PO SYRP
10.0000 mL | ORAL_SOLUTION | ORAL | Status: DC | PRN
  Administered 2019-05-01: 10 mL via ORAL
  Filled 2019-05-01: qty 10

## 2019-05-01 MED ORDER — RALOXIFENE HCL 60 MG PO TABS
60.0000 mg | ORAL_TABLET | Freq: Every day | ORAL | Status: DC
  Administered 2019-05-01 – 2019-05-02 (×2): 60 mg via ORAL
  Filled 2019-05-01 (×3): qty 1

## 2019-05-01 MED ORDER — MORPHINE SULFATE (PF) 2 MG/ML IV SOLN: 4.0000 mg | INTRAVENOUS | Status: DC | PRN

## 2019-05-01 MED ORDER — DULOXETINE HCL 60 MG PO CPEP
60.0000 mg | ORAL_CAPSULE | Freq: Every day | ORAL | Status: DC
  Administered 2019-05-01 – 2019-05-02 (×2): 60 mg via ORAL
  Filled 2019-05-01 (×3): qty 1

## 2019-05-01 MED ORDER — METHYLPREDNISOLONE SODIUM SUCC 125 MG IJ SOLR
60.0000 mg | Freq: Two times a day (BID) | INTRAMUSCULAR | Status: DC
  Administered 2019-05-01 – 2019-05-02 (×3): 60 mg via INTRAVENOUS
  Filled 2019-05-01 (×3): qty 2

## 2019-05-01 MED ORDER — MORPHINE SULFATE (PF) 4 MG/ML IV SOLN
4.0000 mg | INTRAVENOUS | Status: DC | PRN
  Administered 2019-05-01: 4 mg via INTRAVENOUS
  Filled 2019-05-01: qty 1

## 2019-05-01 MED ORDER — CYCLOBENZAPRINE HCL 10 MG PO TABS
10.0000 mg | ORAL_TABLET | Freq: Two times a day (BID) | ORAL | Status: DC | PRN
  Filled 2019-05-01: qty 1

## 2019-05-01 MED ORDER — VITAMIN D (ERGOCALCIFEROL) 1.25 MG (50000 UNIT) PO CAPS: 50000.0000 [IU] | ORAL_CAPSULE | ORAL | Status: DC

## 2019-05-01 MED ORDER — SOTALOL HCL (AF) 120 MG PO TABS: 1.0000 | ORAL_TABLET | Freq: Two times a day (BID) | ORAL | Status: DC

## 2019-05-01 MED ORDER — LEVOTHYROXINE SODIUM 75 MCG PO TABS
125.0000 ug | ORAL_TABLET | Freq: Every day | ORAL | Status: DC
  Administered 2019-05-01 – 2019-05-02 (×2): 125 ug via ORAL
  Filled 2019-05-01 (×2): qty 1

## 2019-05-01 MED ORDER — ZINC SULFATE 220 (50 ZN) MG PO CAPS
220.0000 mg | ORAL_CAPSULE | Freq: Every day | ORAL | Status: DC
  Administered 2019-05-01 – 2019-05-02 (×2): 220 mg via ORAL
  Filled 2019-05-01 (×2): qty 1

## 2019-05-01 MED ORDER — SOTALOL HCL (AF) 120 MG PO TABS
1.0000 | ORAL_TABLET | Freq: Two times a day (BID) | ORAL | Status: DC
  Administered 2019-05-01 – 2019-05-02 (×3): 120 mg via ORAL
  Filled 2019-05-01: qty 1

## 2019-05-01 MED ORDER — VITAMIN D (ERGOCALCIFEROL) 1.25 MG (50000 UNIT) PO CAPS
50000.0000 [IU] | ORAL_CAPSULE | ORAL | Status: DC
  Filled 2019-05-01: qty 1

## 2019-05-01 MED ORDER — DILTIAZEM HCL 60 MG PO TABS
60.0000 mg | ORAL_TABLET | Freq: Three times a day (TID) | ORAL | Status: DC
  Administered 2019-05-01 – 2019-05-02 (×2): 60 mg via ORAL
  Filled 2019-05-01 (×6): qty 1

## 2019-05-01 MED ORDER — POTASSIUM CHLORIDE IN NACL 40-0.9 MEQ/L-% IV SOLN
INTRAVENOUS | Status: AC
  Administered 2019-05-01: 250 mL/h via INTRAVENOUS
  Filled 2019-05-01: qty 1000

## 2019-05-01 MED ORDER — VITAMIN D 25 MCG (1000 UNIT) PO TABS
5000.0000 [IU] | ORAL_TABLET | Freq: Every day | ORAL | Status: DC
  Administered 2019-05-01 – 2019-05-02 (×2): 5000 [IU] via ORAL
  Filled 2019-05-01 (×2): qty 5

## 2019-05-01 MED ORDER — FAMOTIDINE IN NACL 20-0.9 MG/50ML-% IV SOLN
20.0000 mg | Freq: Two times a day (BID) | INTRAVENOUS | Status: DC
  Administered 2019-05-01 (×2): 20 mg via INTRAVENOUS
  Filled 2019-05-01 (×4): qty 50

## 2019-05-01 MED ORDER — HYDROCOD POLST-CPM POLST ER 10-8 MG/5ML PO SUER: 5.0000 mL | Freq: Two times a day (BID) | ORAL | Status: DC | PRN

## 2019-05-01 MED ORDER — INSULIN ASPART 100 UNIT/ML ~~LOC~~ SOLN
0.0000 [IU] | Freq: Three times a day (TID) | SUBCUTANEOUS | Status: DC
  Administered 2019-05-01 (×2): 2 [IU] via SUBCUTANEOUS
  Administered 2019-05-02: 08:00:00 1 [IU] via SUBCUTANEOUS

## 2019-05-01 MED ORDER — CYCLOBENZAPRINE HCL 5 MG PO TABS
10.0000 mg | ORAL_TABLET | Freq: Two times a day (BID) | ORAL | Status: DC | PRN
  Filled 2019-05-01: qty 2

## 2019-05-01 MED ORDER — INSULIN ASPART 100 UNIT/ML ~~LOC~~ SOLN
0.0000 [IU] | Freq: Every day | SUBCUTANEOUS | Status: DC
  Administered 2019-05-01: 2 [IU] via SUBCUTANEOUS

## 2019-05-01 MED ORDER — DILTIAZEM HCL 60 MG PO TABS
60.0000 mg | ORAL_TABLET | Freq: Two times a day (BID) | ORAL | Status: DC
  Administered 2019-05-01: 60 mg via ORAL
  Filled 2019-05-01 (×3): qty 1

## 2019-05-01 MED ORDER — ACETAMINOPHEN 325 MG PO TABS
650.0000 mg | ORAL_TABLET | ORAL | Status: DC | PRN
  Administered 2019-05-01: 01:00:00 650 mg via ORAL
  Filled 2019-05-01: qty 2

## 2019-05-01 MED ORDER — PROCHLORPERAZINE EDISYLATE 10 MG/2ML IJ SOLN: 5.0000 mg | INTRAMUSCULAR | Status: DC | PRN

## 2019-05-01 MED ORDER — VITAMIN C 500 MG PO TABS
500.0000 mg | ORAL_TABLET | Freq: Every day | ORAL | Status: DC
  Administered 2019-05-01 – 2019-05-02 (×2): 500 mg via ORAL
  Filled 2019-05-01 (×2): qty 1

## 2019-05-01 MED ORDER — FENTANYL CITRATE (PF) 100 MCG/2ML IJ SOLN: 25.0000 ug | INTRAMUSCULAR | Status: DC | PRN

## 2019-05-01 NOTE — Progress Notes (Signed)
Notified pharmacy to complete PTA meds for admission

## 2019-05-01 NOTE — Progress Notes (Signed)
Pharmacy Medication Storage Note  Storing home medications for Susa Simmonds in pharmacy secured storage.   Medication storage bag number: [id number]  Delivered to pharmacy @ 1200 by RN  Medications will be returned to patient/caregiver upon discharge.  Onnie Boer, [name], BCIDP, AAHIVP, CPP Infectious Disease Pharmacist 05/01/2019 12:30 PM

## 2019-05-01 NOTE — Discharge Instructions (Signed)

## 2019-05-01 NOTE — Progress Notes (Signed)
82 tablets of Sotalol sent to pharmacy

## 2019-05-01 NOTE — H&P (Signed)
History and Physical    Melinda Mckinney [id number] DOB: 1955-11-18 DOA: 04/30/2019  PCP: Colon Branch, MD   Patient coming from: [facility].  I have personally briefly reviewed patient's old medical records in [place]  Chief Complaint: Weakness.  HPI: Melinda Mckinney is a 63 y.o. female with medical history significant of abnormal cervical Pap smear/stage I cervical cancer with history of cryotherapy, history of anemia, anxiety and depression, asthma, history of atrial fibrillation, history of borderline diabetes, CAD, history of stent placement, lumbar DDD and chronic lower back pain, fibromyalgia, GERD, hiatal hernia, hyperlipidemia, urolithiasis, hypertension, hypothyroidism, insomnia, vulvar area leaking the sclerosis, osteoporosis, vitamin D deficiency who is coming from [facility] after presenting there with a 10-day history of progressively worse fatigue and dyspnea on exertion, but not at rest.  The patient states that about 10 days ago during the weekend she started washing and cleaning her garage and all of a sudden developed a significant sensation of chill, loss of taste, decreased appetite, which was followed by progressively worse weakness, night sweats, body aches, joint pain, abdominal pain, about 3-4 episodes of emesis, but significant nausea and multiple daily episodes of diarrhea.  She states that her abdominal pain is worse during defecation.  She has also had a mildly productive cough of yellowish sputum.  She denies wheezing or hemoptysis, but states that she becomes dyspneic on exertion.  She has been frequently lightheaded, particularly in the last few days.  She denies chest pain, diaphoresis, PND, orthopnea, but states that she gets occasional palpitations and frequent lower extremity edema.  She denies dysuria, frequency or hematuria.  ED Course: Initial vital signs temperature 101.1 F, pulse 68, respirations 20, blood pressure 144/107 mmHg and O2 sat 98% on  room air.  The patient received 250 mL of NS bolus, 8000 mg of acetaminophen p.o. x1, Zofran 4 mg IVP x1 and famotidine 20 mg IVPB x1.  Her CBC was normal with a white count of 5.1, hemoglobin 13.3 g/dL and platelets 279.  PT was 18.7 and INR 1.6.  Fibrinogen was 618 mg/dL.  D-dimer was normal at 0.44 mcg/mL.  Lactic acid was 2.1 and then 1.2 mmol/L.  EKG show atrial fibrillation with minimal ST changes, which is consistent with previous tracings.  Troponin was normal.  BNP mildly elevated at 214.3 pg/mL.  Her CMP showed a CO2 of 21 mmol/L.  All electrolytes are normal when calcium is corrected to albumin of 3.4 g/dL.  Renal function is normal.  Hepatic function tests are within expected value, except for the aforementioned albumin level.  Glucose was 116, magnesium 1.8 and phosphorus 1.7 mg/dL.  Triglycerides, procalcitonin and lipase were normal.  LDH is 196 units/L.  CRP was 600 and 0.6 mg/dL.  Ferritin was 69 ng/mL.  Blood cultures x2 were drawn.  Review of Systems: As per HPI otherwise 10 point review of systems negative.   Past Medical History:  Diagnosis Date  . Abnormal Pap smear of cervix   . Anemia   . Anxiety and depression   . Asthma   . Atrial fibrillation ([place]) 07/2017  . Borderline diabetes   . CAD (coronary artery disease) ~2009   stent   . Cervical cancer ([place])    "stage 1; had cryotherapy"  . Cervical dysplasia   . Chronic lower back pain   . DDD (degenerative disc disease), lumbar    Dr. Nelva Bush, recommends lumbar epidural steroid injections L5-S1 to the right  . Depression   .  Fibromyalgia   . GERD (gastroesophageal reflux disease)   . Headache    "only when I'm short of breath"  (07/27/2017)  . High cholesterol   . History of hiatal hernia   . History of kidney stones   . Hypertension   . Hypothyroidism   . Insomnia 09/27/2013  . Lichen sclerosus    Vulvar area  . Osteoporosis   . Thyroid disease   . Vitamin D deficiency     Past Surgical History:  Procedure  Laterality Date  . CARDIAC CATHETERIZATION  ~ 2008  . CARDIOVERSION N/A 08/05/2017   Procedure: CARDIOVERSION;  Surgeon: Evans Lance, MD;  Location: [facility];  Service: Cardiovascular;  Laterality: N/A;  . CARDIOVERSION N/A 08/24/2017   Procedure: CARDIOVERSION;  Surgeon: Sanda Klein, MD;  Location: [facility];  Service: Cardiovascular;  Laterality: N/A;  . [place]; ~ 1978/1979; 1984  . ESOPHAGOMYOTOMY  2009  . GYNECOLOGIC CRYOSURGERY  X 2   "stage 1 cancer"  . HERNIA REPAIR  X 6   "all in my stomach" (07/27/2017)  . KNEE ARTHROSCOPY Right   . NEPHRECTOMY Right 1991   in [place], due to fibrosis and lithiasis  . TUBAL LIGATION       reports that she quit smoking about 20 years ago. Her smoking use included cigarettes. She has a 56.00 pack-year smoking history. She has never used smokeless tobacco. She reports current alcohol use. She reports that she does not use drugs.  Allergies  Allergen Reactions  . Penicillins Swelling and Rash    Has patient had a PCN reaction causing immediate rash, facial/tongue/throat swelling, SOB or lightheadedness with hypotension: No Has patient had a PCN reaction causing severe rash involving mucus membranes or skin necrosis: No Has patient had a PCN reaction that required hospitalization: No Has patient had a PCN reaction occurring within the last 10 years: No If all of the above answers are "NO", then may proceed with Cephalosporin use.    Family History  Problem Relation Age of Onset  . Breast cancer Other        aunt   . CAD Brother   . Lung cancer Mother        F and M  . Diabetes Father        F and mother   . CAD Father   . Lung cancer Father   . Stroke Sister   . Colon cancer Neg Hx    Prior to Admission medications   Medication Sig Start Date End Date Taking? Authorizing Provider  allopurinol (ZYLOPRIM) 100 MG tablet Take 0.5 tablets (50 mg total) by mouth daily. 12/28/18   Colon Branch, MD  atorvastatin  (LIPITOR) 40 MG tablet Take 1 tablet (40 mg total) by mouth at bedtime. 10/31/18   Colon Branch, MD  Cholecalciferol (VITAMIN D3) 5000 UNITS CAPS Take 1 capsule by mouth daily.     [provider]  clobetasol cream (TEMOVATE) 0.05 % APPLY TOPICALLY TO AFFECTED AREA(S) ONCE A WEEK 01/23/19   Colon Branch, MD  cyclobenzaprine (FLEXERIL) 10 MG tablet Take 1 tablet (10 mg total) by mouth 2 (two) times daily as needed for muscle spasms. 10/01/18   Colon Branch, MD  diltiazem (CARDIZEM) 120 MG tablet TAKE 1/2 TABLET BY MOUTH TWICE DAILY 03/29/19   Colon Branch, MD  DULoxetine (CYMBALTA) 60 MG capsule Take 1 capsule (60 mg total) by mouth daily. 01/28/19   Colon Branch, MD  furosemide (LASIX) 20 MG tablet Take 1 tablet (20 mg total) by mouth daily. 12/17/18   Colon Branch, MD  levothyroxine (SYNTHROID, LEVOTHROID) 125 MCG tablet TAKE ONE TABLET BY MOUTH DAILY BEFORE BREAKFAST 09/12/18   Philemon Kingdom, MD  lisinopril (ZESTRIL) 10 MG tablet Take 1 tablet (10 mg total) by mouth daily. 01/23/19   Colon Branch, MD  metFORMIN (GLUCOPHAGE) 500 MG tablet Take 1 tablet (500 mg total) by mouth 2 (two) times daily with a meal. 01/23/19   Colon Branch, MD  omeprazole (PRILOSEC) 20 MG capsule Take 1 capsule (20 mg total) by mouth daily. 08/20/18   Colon Branch, MD  raloxifene (EVISTA) 60 MG tablet Take 1 tablet (60 mg total) by mouth daily. 09/08/16   Terrance Mass, MD  rivaroxaban (XARELTO) 20 MG TABS tablet Take 1 tablet (20 mg total) by mouth daily with supper. 11/05/18   Colon Branch, MD  SOTALOL AF 120 MG TABS TAKE ONE TABLET BY MOUTH EVERY 12 HOURS 10/02/18   Allred, Jeneen Rinks, MD  zolpidem (AMBIEN) 5 MG tablet Take 1 tablet (5 mg total) by mouth at bedtime as needed. for sleep 01/11/19   Colon Branch, MD    Physical Exam: Vitals:   04/30/19 2200 04/30/19 2230 04/30/19 2300 04/30/19 2331  BP: 106/61 116/69 104/65 98/65  Pulse: (!) 44 70 (!) 36 97  Resp: (!) 22 17 13 16   Temp:      TempSrc:      SpO2: 98% 97% 96% 97%   Weight:      Height:        Constitutional: NAD, calm, comfortable Eyes: PERRL, lids and conjunctivae mildly injected. ENMT: Mucous membranes are mildly dry. Posterior pharynx clear of any exudate or lesions. Neck: normal, supple, no masses, no thyromegaly Respiratory: clear to auscultation bilaterally, no wheezing, no crackles. Normal respiratory effort. No accessory muscle use.  Cardiovascular: Irregularly irregular (occasionally tachycardic, particularly when the patient sat up during examination), no murmurs / rubs / gallops. No extremity edema. 2+ pedal pulses. No carotid bruits.  Abdomen: Obese, nondistended.  Soft, mild epigastric and moderate left lower quadrant tenderness, no guarding or rebound, no masses palpated. No hepatosplenomegaly. Bowel sounds positive.  Musculoskeletal: no clubbing / cyanosis.  Good ROM, no contractures. Normal muscle tone.  Skin: no rashes, lesions, ulcers on limited dermatological examination. Neurologic: CN 2-12 grossly intact. Sensation intact, DTR normal.  Generalized weakness. Psychiatric: Normal judgment and insight. Alert and oriented x 3. Normal mood.   Labs on Admission: I have personally reviewed following labs and imaging studies  CBC: Recent Labs  Lab 04/30/19 1808  WBC 5.1  NEUTROABS 3.4  HGB 13.3  HCT 42.9  MCV 92.7  PLT 123XX123   Basic Metabolic Panel: Recent Labs  Lab 04/30/19 1928  NA 137  K 3.7  CL 103  CO2 21*  GLUCOSE 116*  BUN 12  CREATININE 0.91  CALCIUM 8.6*  MG 1.8  PHOS 1.7*   GFR: Estimated Creatinine Clearance: 71.7 mL/min (by C-G formula based on SCr of 0.91 mg/dL). Liver Function Tests: Recent Labs  Lab 04/30/19 1928  AST 30  ALT 23  ALKPHOS 91  BILITOT 0.7  PROT 7.1  ALBUMIN 3.4*   Recent Labs  Lab 04/30/19 1928  LIPASE 21   No results for input(s): AMMONIA in the last 168 hours. Coagulation Profile: Recent Labs  Lab 04/30/19 2230  INR 1.6*   Cardiac Enzymes: No results for input(s):  CKTOTAL, CKMB,  CKMBINDEX, TROPONINI in the last 168 hours. BNP (last 3 results) No results for input(s): PROBNP in the last 8760 hours. HbA1C: No results for input(s): HGBA1C in the last 72 hours. CBG: No results for input(s): GLUCAP in the last 168 hours. Lipid Profile: Recent Labs    04/30/19 1928  TRIG 96   Thyroid Function Tests: No results for input(s): TSH, T4TOTAL, FREET4, T3FREE, THYROIDAB in the last 72 hours. Anemia Panel: Recent Labs    04/30/19 1808  FERRITIN 69   Urine analysis:    Component Value Date/Time   COLORURINE YELLOW 07/24/2014 0848   APPEARANCEUR CLEAR 07/24/2014 0848   LABSPEC >=1.030 (A) 07/24/2014 0848   PHURINE 5.0 07/24/2014 0848   GLUCOSEU NEGATIVE 07/24/2014 0848   HGBUR NEGATIVE 07/24/2014 0848   BILIRUBINUR NEGATIVE 07/24/2014 0848   KETONESUR NEGATIVE 07/24/2014 0848   PROTEINUR 30 (A) 07/30/2012 1639   UROBILINOGEN 0.2 07/24/2014 0848   NITRITE NEGATIVE 07/24/2014 0848   LEUKOCYTESUR TRACE (A) 07/24/2014 0848    Radiological Exams on Admission: Dg Chest Port 1 View  Result Date: 04/30/2019 CLINICAL DATA:  Fever and body ache EXAM: PORTABLE CHEST 1 VIEW COMPARISON:  07/27/2017 FINDINGS: Mild cardiomegaly. Similar appearance of diffuse increased interstitial opacity, therefore felt chronic. No acute focal airspace disease or effusion. No pneumothorax. IMPRESSION: No active disease. Stable cardiomegaly and diffuse interstitial prominence. Electronically Signed   By: Donavan Foil M.D.   On: 04/30/2019 17:49   Dec/2018 echo  ------------------------------------------------------------------- LV EF: 45% -   50%  ------------------------------------------------------------------- Indications:      I48.91 Atrial fibrillation.  ------------------------------------------------------------------- History:   PMH:   Coronary artery disease.  Risk factors: Hypertension. Diabetes mellitus. Dyslipidemia.   ------------------------------------------------------------------- Study Conclusions  - Left ventricle: The cavity size was normal. Wall thickness was   normal. Systolic function was mildly reduced. The estimated   ejection fraction was in the range of 45% to 50%. - Aortic valve: There was mild regurgitation. - Mitral valve: There was moderate regurgitation. - Right atrium: The atrium was mildly dilated.   EKG: Independently reviewed.  Vent. rate 139 BPM PR interval * ms QRS duration 83 ms QT/QTc 302/460 ms P-R-T axes * 18 46 Atrial fibrillation Minimal ST depression, anterolateral leads  Assessment/Plan Principal Problem:   2019 novel coronavirus disease (COVID-19) No significant respiratory symptoms. Observation/telemetry. Begin time-limited IVF/electrolyte replacement. Continue analgesics as needed. (Switch from morphine to fentanyl due to hypotension) Continue acetaminophen as needed for fever. Continue PPI. Continue antiemetics as needed.  Active Problems:   Atrial fibrillation (HCC) CHA?DS?-VASc Score of at least 6. Continue Cardizem 60 mg p.o. twice daily. Continue sotalol 120 mg p.o. twice daily. Continue Xarelto 20 mg p.o. daily for CVA prevention.    Essential hypertension The patient is currently having hypotension. Hold lisinopril and furosemide. Time-limited IV hydration. Continue Cardizem 60 mg p.o. twice daily. Monitor BP, HR, renal function electrolytes.    GERD, h/o achalasia s/p heller ~2009) The patient takes Prilosec at home. Protonix 40 mg p.o. daily.    Hyperlipidemia LDL goal <70 Continue atorvastatin 40 mg p.o. every evening.    Vitamin D deficiency Continue vitamin D 5000 units by mouth daily.    Hypothyroidism Continue levothyroxine 125 mcg p.o. daily.    Hyperglycemia Monitor CBG before meals and bedtime.    DVT prophylaxis: On apixaban. Code Status: Full code. Family Communication: Disposition Plan: 24 to 48-hour  observation for symptoms treatment and monitoring. Consults called: Admission status: Observation/telemetry.   Reubin Milan MD  [facility]  If 7PM-7AM, please contact night-coverage [email or web address]  05/01/2019, 12:36 AM   The document was prepared using Dragon voice recognition software and may contain some unintended transcription errors.

## 2019-05-01 NOTE — ED Notes (Signed)
Phone report called Antoine Primas, Therapist, sports at [facility]. [facility] has not yet given a time for transport

## 2019-05-01 NOTE — Progress Notes (Signed)
PROGRESS NOTE  Brief Narrative: Melinda Mckinney is a 63 y.o. female with a history of CAD s/p PCI, chronic atrial fibrillation, chronic HFrEF, borderline diabetes, HTN, HLD, hypothyroidism, and stage 1 cervical CA s/p cryotherapy who presented to [facility] with 10 days of progressively worsening fatigue and exertional dyspnea associated with generalized weakness, abdominal pain, nausea, vomiting and diarrhea. In the ED she was febrile to 101.58F, slightly tachypneic with no hypoxia at rest. CBC was normal, d-dimer not elevated, lactic acid 2.1 down to 1.2 after IV fluids. ECG unchanged from previous demonstrating AFib. Troponin normal and BNP 214.3. CRP elevated at 6.6, procalcitonin undetectable, and CXR showed no infiltrates. SARS-CoV-2 was positive in the ED. Blood cultures were drawn and the patient admitted to [facility].   Subjective: Feels well, no shortness of breath. Had 3 episodes of diarrhea in past 24 hours, loose stools, some water. Abdominal pain is mild, improved from admission. Attempted to use 2 different Spanish video interpretors though technical difficulties precluded this. Patient speaks English as well and declined further search for translator services. No chest pain or palpitations. Relieved not to have pneumonia on CXR.   Objective: BP 120/74   Pulse 100   Temp 98.4 F (36.9 C) (Oral)   Resp 16   Ht 5\' 6"  (1.676 m)   Wt 95.3 kg   SpO2 99%   BMI 33.89 kg/m   Gen: Well-appearing Pulm: Clear and nonlabored on room air  CV: RRR, no murmur, no JVD, no edema GI: Soft, mildly diffusely tender without rebound or guarding, ND, +BS  Neuro: Alert and oriented. No focal deficits. Skin: No rashes, lesions or ulcers  Assessment & Plan: Principal Problem:   2019 novel coronavirus disease (COVID-19) Active Problems:   Essential hypertension   GERD, h/o achalasia s/p heller ~2009)   Hyperlipidemia LDL goal <70   Vitamin D deficiency   Hypothyroidism   Hyperglycemia   Atrial fibrillation  ([place])  Covid-19 infection: No CXR infiltrates. inflammatory markers elevated (CRP 6.6) with negative PCT not suggestive of bacterial PNA. No documented hypoxia at this time. - Currently on 2L O2, took off of patient because SpO2 was 98-100% and no respiratory distress at rest. Wean as tolerated.  - Start steroids - Does not meet criteria for remdesivir at this time. Holding EUA use of CCP due to clinical stability.  - Trend inflammatory markers - Prone and IS encouraged.  - OOB, up with assistance encouraged.   Nausea, vomiting, diarrhea: Abd exam benign. - Continue antiemetics - Given IVF this AM. Currently tolerating po fluids, so will continue with that alone given hx CHF.  Chronic HFrEF: Appears euvolemic, perhaps slightly volume down. Troponin negative, BNP borderline at 214.  - Monitor I/O, daily weights. - Hold lasix today. - Hold ACE inhibitor due to borderline BP and dehydration.  Chronic atrial fibrillation:  - Continue home sotalol and diltiazem.  - Continue telemetry monitoring for borderline elevated rates - Continue anticoagulation with xarelto  HTN:  - Hold ACE inhibitor as above.  Prediabetes:  - Hold metformin. - Continue SSI, AC/HS CBG checks. Advance as tolerated to car-modified/heart healthy diet.  Hypothyroidism: TSH at goal at 1.467.  - Continue synthroid.   Gout: Chronic, no flare.  - Continue allopurinol  Hyperlipidemia:  - Continue statin  Depression, fibromyalgia:  - Continue duloxetine.   GERD:  - Continue PPI    Patrecia Pour, MD Pager [phone] 05/01/2019, 10:52 AM

## 2019-05-01 NOTE — Progress Notes (Signed)
Patient is A-Fib on monitor and HR increased to the 160s but did not sustain.  Pt. Is asymptomatic.  Dr. Olevia Bowens is aware.

## 2019-05-01 NOTE — Progress Notes (Signed)
MEWS yellow at shift change.  Pt HR variable d/t afib.  Admitted tachypneic.  Current pt condition baseline.

## 2019-05-01 NOTE — ED Notes (Signed)
PT up to BR large loose stool.

## 2019-05-01 NOTE — Progress Notes (Signed)
   05/01/19 1825  Vitals  Pulse Rate 91  ECG Heart Rate (!) 137   notified MD Grunz pt HR is hovering around 110-137.. requested PRN incase HR sustains the rate

## 2019-05-02 DIAGNOSIS — E039 Hypothyroidism, unspecified: Secondary | ICD-10-CM

## 2019-05-02 DIAGNOSIS — K219 Gastro-esophageal reflux disease without esophagitis: Secondary | ICD-10-CM

## 2019-05-02 DIAGNOSIS — R739 Hyperglycemia, unspecified: Secondary | ICD-10-CM

## 2019-05-02 DIAGNOSIS — J988 Other specified respiratory disorders: Secondary | ICD-10-CM

## 2019-05-02 DIAGNOSIS — E559 Vitamin D deficiency, unspecified: Secondary | ICD-10-CM

## 2019-05-02 DIAGNOSIS — I10 Essential (primary) hypertension: Secondary | ICD-10-CM

## 2019-05-02 DIAGNOSIS — E785 Hyperlipidemia, unspecified: Secondary | ICD-10-CM

## 2019-05-02 DIAGNOSIS — I480 Paroxysmal atrial fibrillation: Secondary | ICD-10-CM

## 2019-05-02 LAB — COMPREHENSIVE METABOLIC PANEL
ALT: 20 U/L (ref 0–44)
AST: 22 U/L (ref 15–41)
Albumin: 3.3 g/dL — ABNORMAL LOW (ref 3.5–5.0)
Alkaline Phosphatase: 84 U/L (ref 38–126)
Anion gap: 9 (ref 5–15)
BUN: 9 mg/dL (ref 8–23)
CO2: 20 mmol/L — ABNORMAL LOW (ref 22–32)
Calcium: 9.1 mg/dL (ref 8.9–10.3)
Chloride: 111 mmol/L (ref 98–111)
Creatinine, Ser: 0.69 mg/dL (ref 0.44–1.00)
GFR calc Af Amer: >60 mL/min (ref >60)
GFR calc non Af Amer: >60 mL/min (ref >60)
Glucose, Bld: 194 mg/dL — ABNORMAL HIGH (ref 70–99)
Potassium: 4.4 mmol/L (ref 3.5–5.1)
Sodium: 140 mmol/L (ref 135–145)
Total Bilirubin: 0.5 mg/dL (ref 0.3–1.2)
Total Protein: 6.8 g/dL (ref 6.5–8.1)

## 2019-05-02 LAB — HEMOGLOBIN A1C
Hgb A1c MFr Bld: 6.1 % — ABNORMAL HIGH (ref 4.8–5.6)
Mean Plasma Glucose: 128 mg/dL

## 2019-05-02 LAB — CBC WITH DIFFERENTIAL/PLATELET
Abs Immature Granulocytes: 0.04 10*3/uL (ref 0.00–0.07)
Basophils Absolute: 0 10*3/uL (ref 0.0–0.1)
Basophils Relative: 0 %
Eosinophils Absolute: 0 10*3/uL (ref 0.0–0.5)
Eosinophils Relative: 0 %
HCT: 38.2 % (ref 36.0–46.0)
Hemoglobin: 11.9 g/dL — ABNORMAL LOW (ref 12.0–15.0)
Immature Granulocytes: 1 %
Lymphocytes Relative: 18 %
Lymphs Abs: 0.8 10*3/uL (ref 0.7–4.0)
MCH: 29 pg (ref 26.0–34.0)
MCHC: 31.2 g/dL (ref 30.0–36.0)
MCV: 93.2 fL (ref 80.0–100.0)
Monocytes Absolute: 0.1 10*3/uL (ref 0.1–1.0)
Monocytes Relative: 2 %
Neutro Abs: 3.5 10*3/uL (ref 1.7–7.7)
Neutrophils Relative %: 79 %
Platelets: 270 10*3/uL (ref 150–400)
RBC: 4.1 MIL/uL (ref 3.87–5.11)
RDW: 14.3 % (ref 11.5–15.5)
WBC: 4.4 10*3/uL (ref 4.0–10.5)
nRBC: 0 % (ref 0.0–0.2)

## 2019-05-02 LAB — C-REACTIVE PROTEIN: CRP: 2.3 mg/dL — ABNORMAL HIGH (ref <1.0)

## 2019-05-02 LAB — GLUCOSE, CAPILLARY: Glucose-Capillary: 145 mg/dL — ABNORMAL HIGH (ref 70–99)

## 2019-05-02 LAB — HIV ANTIBODY (ROUTINE TESTING W REFLEX): HIV Screen 4th Generation wRfx: NONREACTIVE

## 2019-05-02 MED ORDER — DEXAMETHASONE 6 MG PO TABS: 6.0000 mg | ORAL_TABLET | Freq: Every day | ORAL | 0 refills | Status: AC

## 2019-05-02 NOTE — Care Management (Signed)
Patient admitted and treated for COVID related symptoms. Thankfully being discharged home with no needs.     Ricki Miller, RN BSN Case Manager [phone]

## 2019-05-02 NOTE — Discharge Summary (Signed)
Physician Discharge Summary  Melinda Mckinney [id number] DOB: 12-16-1955 DOA: 04/30/2019  PCP: Colon Branch, MD  Admit date: 04/30/2019 Discharge date: 05/02/2019  Admitted From: Home Disposition: Home   Recommendations for Outpatient Follow-up:  1. Follow up with PCP in 2 weeks 2. Please obtain CMP/CBC in one week  Home Health: None Equipment/Devices: None Discharge Condition: Stable CODE STATUS: Full Diet recommendation: Heart healthy  Brief/Interim Summary: Melinda Mckinney is a 63 y.o. female with a history of CAD s/p PCI, chronic atrial fibrillation, chronic HFrEF, borderline diabetes, HTN, HLD, hypothyroidism, and stage 1 cervical CA s/p cryotherapy who presented to [facility] with 10 days of progressively worsening fatigue and exertional dyspnea associated with generalized weakness, abdominal pain, nausea, vomiting and diarrhea. In the ED she was febrile to 101.14F, slightly tachypneic with no hypoxia at rest. CBC was normal, d-dimer not elevated, lactic acid 2.1 down to 1.2 after IV fluids. ECG unchanged from previous demonstrating AFib. Troponin normal and BNP 214.3. CRP elevated at 6.6, procalcitonin undetectable, and CXR showed no infiltrates. SARS-CoV-2 was positive in the ED. Blood cultures were drawn and the patient admitted to [facility]. With initiation of steroids, the patient's hypoxia rapidly resolved and other symptoms have shown significant improvement. She does not currently require any further inpatient work up or treatments, so is discharged home in stable, improved condition with return precautions reviewed in detail.  Discharge Diagnoses:  Principal Problem:   2019 novel coronavirus disease (COVID-19) Active Problems:   Essential hypertension   GERD, h/o achalasia s/p heller ~2009)   Hyperlipidemia LDL goal <70   Vitamin D deficiency   Hypothyroidism   Hyperglycemia   Atrial fibrillation ([place])   COVID-19  Covid-19 infection: No CXR infiltrates. inflammatory markers elevated  (CRP 6.6) with negative PCT not suggestive of bacterial PNA. Inflammatory markers improved. Has not been hypoxic > 24 hours. - Continue steroids x10 days. - Does not meet criteria for remdesivir at this time. Holding EUA use of CCP due to clinical stability.  - Proning, incentive spirometry, and mobilizing reviewed.  Nausea, vomiting, diarrhea: Abd exam benign. Symptoms have resolved per the patient.  - Continue antiemetics prn - Currently tolerating po fluids, so will continue with that alone given hx CHF.  Chronic HFrEF: Appears euvolemic, perhaps slightly volume down. Troponin negative, BNP borderline at 214.  - Restart lasix and ACE inhibitor beginning 9/18  Chronic atrial fibrillation: Rates initially elevated, but have improved and pt is asymptomatic. - Continue home sotalol and diltiazem.  - Continue anticoagulation with xarelto  HTN:  - Hold ACE inhibitor as above.  Prediabetes:  - Can restart home medication regimen.   Hypothyroidism: TSH at goal at 1.467.  - Continue synthroid.   Gout: Chronic, no flare.  - Continue allopurinol  Hyperlipidemia:  - Continue statin  Depression, fibromyalgia:  - Continue duloxetine.   GERD:  - Continue PPI  Obesity: BMI 39  Discharge Instructions Discharge Instructions    Diet - low sodium heart healthy   Complete by: As directed    Diet Carb Modified   Complete by: As directed    Discharge instructions   Complete by: As directed    You are being discharged from the hospital after treatment for covid-19 infection. You are felt to be stable enough to no longer require inpatient monitoring, testing, and treatment, though you will need to follow the recommendations below: - Continue taking decadron (steroid) for 8 more days. - Remain in self-isolation for 14 days following discharge to reduce risk  of transmission of the virus.  - Do not take NSAID medications (including, but not limited to, ibuprofen, advil, motrin,  naproxen, aleve, goody's powder, etc.) - Follow up with your doctor in the next week via telehealth or seek medical attention right away if your symptoms get WORSE.  - Consider donating plasma after you have recovered (either 14 days after a negative test or 28 days after symptoms have completely resolved) because your antibodies to this virus may be helpful to give to others with life-threatening infections. Please go to the website www.[email or web address] if you would like to consider volunteering for plasma donation.    Directions for you at home:  Wear a facemask You should wear a facemask that covers your nose and mouth when you are in the same room with other people and when you visit a healthcare provider. People who live with or visit you should also wear a facemask while they are in the same room with you.  Separate yourself from other people in your home As much as possible, you should stay in a different room from other people in your home. Also, you should use a separate bathroom, if available.  Avoid sharing household items You should not share dishes, drinking glasses, cups, eating utensils, towels, bedding, or other items with other people in your home. After using these items, you should wash them thoroughly with soap and water.  Cover your coughs and sneezes Cover your mouth and nose with a tissue when you cough or sneeze, or you can cough or sneeze into your sleeve. Throw used tissues in a lined trash can, and immediately wash your hands with soap and water for at least 20 seconds or use an alcohol-based hand rub.  Wash your [facility] your hands often and thoroughly with soap and water for at least 20 seconds. You can use an alcohol-based hand sanitizer if soap and water are not available and if your hands are not visibly dirty. Avoid touching your eyes, nose, and mouth with unwashed hands.  Directions for those who live with, or provide care at home for you:  Limit the  number of people who have contact with the patient If possible, have only one caregiver for the patient. Other household members should stay in another home or place of residence. If this is not possible, they should stay in another room, or be separated from the patient as much as possible. Use a separate bathroom, if available. Restrict visitors who do not have an essential need to be in the home.  Ensure good ventilation Make sure that shared spaces in the home have good air flow, such as from an air conditioner or an opened window, weather permitting.  Wash your hands often Wash your hands often and thoroughly with soap and water for at least 20 seconds. You can use an alcohol based hand sanitizer if soap and water are not available and if your hands are not visibly dirty. Avoid touching your eyes, nose, and mouth with unwashed hands. Use disposable paper towels to dry your hands. If not available, use dedicated cloth towels and replace them when they become wet.  Wear a facemask and gloves Wear a disposable facemask at all times in the room and gloves when you touch or have contact with the patient's blood, body fluids, and/or secretions or excretions, such as sweat, saliva, sputum, nasal mucus, vomit, urine, or feces.  Ensure the mask fits over your nose and mouth tightly, and do not touch it  during use. Throw out disposable facemasks and gloves after using them. Do not reuse. Wash your hands immediately after removing your facemask and gloves. If your personal clothing becomes contaminated, carefully remove clothing and launder. Wash your hands after handling contaminated clothing. Place all used disposable facemasks, gloves, and other waste in a lined container before disposing them with other household waste. Remove gloves and wash your hands immediately after handling these items.  Do not share dishes, glasses, or other household items with the patient Avoid sharing household items.  You should not share dishes, drinking glasses, cups, eating utensils, towels, bedding, or other items with a patient who is confirmed to have, or being evaluated for, COVID-19 infection. After the person uses these items, you should wash them thoroughly with soap and water.  Wash laundry thoroughly Immediately remove and wash clothes or bedding that have blood, body fluids, and/or secretions or excretions, such as sweat, saliva, sputum, nasal mucus, vomit, urine, or feces, on them. Wear gloves when handling laundry from the patient. Read and follow directions on labels of laundry or clothing items and detergent. In general, wash and dry with the warmest temperatures recommended on the label.  Clean all areas the individual has used often Clean all touchable surfaces, such as counters, tabletops, doorknobs, bathroom fixtures, toilets, phones, keyboards, tablets, and bedside tables, every day. Also, clean any surfaces that may have blood, body fluids, and/or secretions or excretions on them. Wear gloves when cleaning surfaces the patient has come in contact with. Use a diluted bleach solution (e.g., dilute bleach with 1 part bleach and 10 parts water) or a household disinfectant with a label that says EPA-registered for coronaviruses. To make a bleach solution at home, add 1 tablespoon of bleach to 1 quart (4 cups) of water. For a larger supply, add  cup of bleach to 1 gallon (16 cups) of water. Read labels of cleaning products and follow recommendations provided on product labels. Labels contain instructions for safe and effective use of the cleaning product including precautions you should take when applying the product, such as wearing gloves or eye protection and making sure you have good ventilation during use of the product. Remove gloves and wash hands immediately after cleaning.  Monitor yourself for signs and symptoms of illness Caregivers and household members are considered close contacts,  should monitor their health, and will be asked to limit movement outside of the home to the extent possible. Follow the monitoring steps for close contacts listed on the symptom monitoring form.  If you have additional questions, contact your local health department or call the epidemiologist on call at [phone] (available 24/7). This guidance is subject to change. For the most up-to-date guidance from [facility], please refer to their website: YouBlogs.pl   Increase activity slowly   Complete by: As directed    MyChart COVID-19 home monitoring program   Complete by: May 02, 2019    Is the patient willing to use the [place] for home monitoring?: Yes   Temperature monitoring   Complete by: May 02, 2019    After how many days would you like to receive a notification of this patient's flowsheet entries?: 1     Allergies as of 05/02/2019      Reactions   Penicillins Swelling, Rash   Has patient had a PCN reaction causing immediate rash, facial/tongue/throat swelling, SOB or lightheadedness with hypotension: No Has patient had a PCN reaction causing severe rash involving mucus membranes or skin necrosis: No  Has patient had a PCN reaction that required hospitalization: No Has patient had a PCN reaction occurring within the last 10 years: No If all of the above answers are "NO", then may proceed with Cephalosporin use.      Medication List    TAKE these medications   allopurinol 100 MG tablet Commonly known as: ZYLOPRIM Take 0.5 tablets (50 mg total) by mouth daily. What changed:   how much to take  when to take this   atorvastatin 40 MG tablet Commonly known as: LIPITOR Take 1 tablet (40 mg total) by mouth at bedtime.   cyclobenzaprine 10 MG tablet Commonly known as: FLEXERIL Take 1 tablet (10 mg total) by mouth 2 (two) times daily as needed for muscle spasms.   dexamethasone 6 MG tablet Commonly known as:  Decadron Take 1 tablet (6 mg total) by mouth daily for 8 days.   diltiazem 120 MG tablet Commonly known as: CARDIZEM TAKE 1/2 TABLET BY MOUTH TWICE DAILY   DULoxetine 60 MG capsule Commonly known as: CYMBALTA Take 1 capsule (60 mg total) by mouth daily.   furosemide 20 MG tablet Commonly known as: LASIX Take 1 tablet (20 mg total) by mouth daily.   levothyroxine 125 MCG tablet Commonly known as: SYNTHROID TAKE ONE TABLET BY MOUTH DAILY BEFORE BREAKFAST What changed: See the new instructions.   lisinopril 10 MG tablet Commonly known as: ZESTRIL Take 1 tablet (10 mg total) by mouth daily.   metFORMIN 500 MG tablet Commonly known as: GLUCOPHAGE Take 1 tablet (500 mg total) by mouth 2 (two) times daily with a meal.   omeprazole 20 MG capsule Commonly known as: PRILOSEC Take 1 capsule (20 mg total) by mouth daily. What changed: when to take this   PRESCRIPTION MEDICATION 4 g See admin instructions. Testosterone 2 % cream  APPLY FOUR CLICKS (ONE GRAM) TO INNER THIGH DAILY. ROTATE   raloxifene 60 MG tablet Commonly known as: EVISTA Take 1 tablet (60 mg total) by mouth daily. What changed: when to take this   rivaroxaban 20 MG Tabs tablet Commonly known as: Xarelto Take 1 tablet (20 mg total) by mouth daily with supper.   SOTALOL AF 120 MG Tabs TAKE ONE TABLET BY MOUTH EVERY 12 HOURS   Vitamin D3 125 MCG (5000 UT) Caps Take 5,000 Units by mouth daily.   zolpidem 5 MG tablet Commonly known as: AMBIEN Take 1 tablet (5 mg total) by mouth at bedtime as needed. for sleep What changed: reasons to take this      [place], MD. Schedule an appointment as soon as possible for a visit in 2 week(s).   Specialty: Internal Medicine Contact information: [id number] [facility] [place] [address] [phone]        Thompson Grayer, MD .   Specialty: Cardiology Contact information: [address] [place]  [place] [address] [phone]          Allergies  Allergen Reactions  . Penicillins Swelling and Rash    Has patient had a PCN reaction causing immediate rash, facial/tongue/throat swelling, SOB or lightheadedness with hypotension: No Has patient had a PCN reaction causing severe rash involving mucus membranes or skin necrosis: No Has patient had a PCN reaction that required hospitalization: No Has patient had a PCN reaction occurring within the last 10 years: No If all of the above answers are "NO", then may proceed with Cephalosporin use.  Consultations:  None  Procedures/Studies: Dg Chest Port 1 View  Result Date: 04/30/2019 CLINICAL DATA:  Fever and body ache EXAM: PORTABLE CHEST 1 VIEW COMPARISON:  07/27/2017 FINDINGS: Mild cardiomegaly. Similar appearance of diffuse increased interstitial opacity, therefore felt chronic. No acute focal airspace disease or effusion. No pneumothorax. IMPRESSION: No active disease. Stable cardiomegaly and diffuse interstitial prominence. Electronically Signed   By: Donavan Foil M.D.   On: 04/30/2019 17:49    Subjective: Feels "so much better." No further headache, nausea, vomiting, diarrhea. Denies any shortness of breath with ambulation. No chest pain. Wants to go home.  Discharge Exam: Vitals:   05/02/19 0459 05/02/19 0754  BP: 123/77 118/65  Pulse: 86 91  Resp: 14 20  Temp: 97.8 F (36.6 C) 98.1 F (36.7 C)  SpO2: 95% 96%   General: Pt is alert, awake, not in acute distress Cardiovascular: RRR, S1/S2 +, no rubs, no gallops Respiratory: CTA bilaterally, no wheezing, no rhonchi Abdominal: Soft, NT, ND, bowel sounds + Extremities: No edema, no cyanosis  Labs: BNP (last 3 results) Recent Labs    04/30/19 1808  BNP 123XX123*   Basic Metabolic Panel: Recent Labs  Lab 04/30/19 1928 05/02/19 0220  NA 137 140  K 3.7 4.4  CL 103 111  CO2 21* 20*  GLUCOSE 116* 194*  BUN 12 9  CREATININE 0.91 0.69  CALCIUM 8.6* 9.1  MG 1.8  --    PHOS 1.7*  --    Liver Function Tests: Recent Labs  Lab 04/30/19 1928 05/02/19 0220  AST 30 22  ALT 23 20  ALKPHOS 91 84  BILITOT 0.7 0.5  PROT 7.1 6.8  ALBUMIN 3.4* 3.3*   Recent Labs  Lab 04/30/19 1928  LIPASE 21   No results for input(s): AMMONIA in the last 168 hours. CBC: Recent Labs  Lab 04/30/19 1808 05/02/19 0220  WBC 5.1 4.4  NEUTROABS 3.4 3.5  HGB 13.3 11.9*  HCT 42.9 38.2  MCV 92.7 93.2  PLT 279 270   Cardiac Enzymes: No results for input(s): CKTOTAL, CKMB, CKMBINDEX, TROPONINI in the last 168 hours. BNP: Invalid input(s): POCBNP CBG: Recent Labs  Lab 05/01/19 1137 05/01/19 1658 05/01/19 2009 05/01/19 2207 05/02/19 0754  GLUCAP 154* 184* 253* 221* 145*   D-Dimer Recent Labs    04/30/19 1928  DDIMER 0.44   Hgb A1c Recent Labs    05/01/19 0655  HGBA1C 6.1*   Lipid Profile Recent Labs    04/30/19 1928  TRIG 96   Thyroid function studies Recent Labs    04/30/19 2230  TSH 1.467   Anemia work up Recent Labs    04/30/19 1808  FERRITIN 69   Urinalysis    Component Value Date/Time   COLORURINE YELLOW 07/24/2014 0848   APPEARANCEUR CLEAR 07/24/2014 0848   LABSPEC >=1.030 (A) 07/24/2014 0848   PHURINE 5.0 07/24/2014 0848   GLUCOSEU NEGATIVE 07/24/2014 0848   HGBUR NEGATIVE 07/24/2014 0848   BILIRUBINUR NEGATIVE 07/24/2014 0848   KETONESUR NEGATIVE 07/24/2014 0848   PROTEINUR 30 (A) 07/30/2012 1639   UROBILINOGEN 0.2 07/24/2014 0848   NITRITE NEGATIVE 07/24/2014 0848   LEUKOCYTESUR TRACE (A) 07/24/2014 0848    Microbiology Recent Results (from the past 240 hour(s))  Blood Culture (routine x 2)     Status: None (Preliminary result)   Collection Time: 04/30/19  5:50 PM   Specimen: BLOOD RIGHT ARM  Result Value Ref Range Status   Specimen Description   Final    BLOOD RIGHT ARM  Performed at [facility], [address] [place], [place] [address]    Special Requests   Final    BOTTLES DRAWN AEROBIC AND  ANAEROBIC Blood Culture results may not be optimal due to an inadequate volume of blood received in culture bottles Performed at [facility], [address] [place], [place] [address]    Culture   Final    NO GROWTH < 12 HOURS Performed at [facility], [place] [address] [place], [place] [address]    Report Status PENDING  Incomplete  SARS Coronavirus 2 [facility] order, Performed in [facility] hospital lab) Nasopharyngeal Nasopharyngeal Swab     Status: Abnormal   Collection Time: 04/30/19  6:08 PM   Specimen: Nasopharyngeal Swab  Result Value Ref Range Status   SARS Coronavirus 2 POSITIVE (A) NEGATIVE Final    Comment: RESULT CALLED TO, READ BACK BY AND VERIFIED WITH: PERSNELL KAREN A RN AT 1935 04/30/19  BY HASSAN R (NOTE) If result is NEGATIVE SARS-CoV-2 target nucleic acids are NOT DETECTED. The SARS-CoV-2 RNA is generally detectable in upper and lower  respiratory specimens during the acute phase of infection. The lowest  concentration of SARS-CoV-2 viral copies this assay can detect is 250  copies / mL. A negative result does not preclude SARS-CoV-2 infection  and should not be used as the sole basis for treatment or other  patient management decisions.  A negative result may occur with  improper specimen collection / handling, submission of specimen other  than nasopharyngeal swab, presence of viral mutation(s) within the  areas targeted by this assay, and inadequate number of viral copies  (<250 copies / mL). A negative result must be combined with clinical  observations, patient history, and epidemiological information. If result is POSITIVE SARS-CoV-2 target nucleic acids are  DETECTED. The SARS-CoV-2 RNA is generally detectable in upper and lower  respiratory specimens during the acute phase of infection.  Positive  results are indicative of active infection with SARS-CoV-2.  Clinical  correlation with patient history and other diagnostic information is   necessary to determine patient infection status.  Positive results do  not rule out bacterial infection or co-infection with other viruses. If result is PRESUMPTIVE POSTIVE SARS-CoV-2 nucleic acids MAY BE PRESENT.   A presumptive positive result was obtained on the submitted specimen  and confirmed on repeat testing.  While 2019 novel coronavirus  (SARS-CoV-2) nucleic acids may be present in the submitted sample  additional confirmatory testing may be necessary for epidemiological  and / or clinical management purposes  to differentiate between  SARS-CoV-2 and other Sarbecovirus currently known to infect humans.  If clinically indicated additional testing with an alternate test  methodology  [id number]) is advised. The SARS-CoV-2 RNA is generally  detectable in upper and lower respiratory specimens during the acute  phase of infection. The expected result is Negative. Fact Sheet for Patients:  StrictlyIdeas.no Fact Sheet for Healthcare Providers: BankingDealers.co.za This test is not yet approved or cleared by the [place] FDA and has been authorized for detection and/or diagnosis of SARS-CoV-2 by FDA under an Emergency Use Authorization (EUA).  This EUA will remain in effect (meaning this test can be used) for the duration of the COVID-19 declaration under Section 564(b)(1) of the Act, 21 U.S.C. section 360bbb-3(b)(1), unless the authorization is terminated or revoked sooner. Performed at [facility], [address] [place], [place] [address]   Blood Culture (routine x 2)  Status: None (Preliminary result)   Collection Time: 04/30/19  6:08 PM   Specimen: BLOOD LEFT ARM  Result Value Ref Range Status   Specimen Description   Final    BLOOD LEFT ARM Performed at [facility], [address] [place], [place] [address]    Special Requests   Final    BOTTLES DRAWN AEROBIC AND ANAEROBIC Blood Culture  adequate volume Performed at [facility], [address] [place], [place] [address]    Culture   Final    NO GROWTH < 12 HOURS Performed at [facility], [place] [address] [place], [place] [address]    Report Status PENDING  Incomplete    Time coordinating discharge: Approximately 40 minutes  Patrecia Pour, MD  [facility] 05/02/2019, 10:35 AM

## 2019-05-05 LAB — CULTURE, BLOOD (ROUTINE X 2)
Culture: NO GROWTH
Culture: NO GROWTH
Special Requests: ADEQUATE

## 2019-05-10 ENCOUNTER — Telehealth: Payer: Self-pay | Admitting: Internal Medicine

## 2019-05-10 NOTE — Telephone Encounter (Signed)
Pt scheduled for HFU VOV on Monday 09-28. Done

## 2019-05-10 NOTE — Telephone Encounter (Signed)
-----   Message from Albin Felling sent at 05/10/2019 10:58 AM EDT ----- Regarding: FW: Needs a hospital follow-up, please arrange for early next week Doreen Salvage, will you please call pt to schedule a DOXY for her hospital f/u.  I know she speaks Spanish and her spouse is bilingual.  They are the pts that came in the office yesterday.  Thank you so much! ----- Message ----- From: Colon Branch, MD Sent: 05/10/2019  10:32 AM EDT To: Albin Felling Subject: Needs a hospital follow-up, please arrange f#

## 2019-05-13 ENCOUNTER — Other Ambulatory Visit: Payer: Self-pay

## 2019-05-13 ENCOUNTER — Ambulatory Visit (INDEPENDENT_AMBULATORY_CARE_PROVIDER_SITE_OTHER): Payer: Medicare Other | Admitting: Internal Medicine

## 2019-05-13 DIAGNOSIS — U071 COVID-19: Secondary | ICD-10-CM | POA: Diagnosis not present

## 2019-05-13 DIAGNOSIS — I10 Essential (primary) hypertension: Secondary | ICD-10-CM | POA: Diagnosis not present

## 2019-05-13 NOTE — Progress Notes (Signed)
Subjective:    Patient ID: Melinda Mckinney, female    DOB: 08/07/1956, 63 y.o.   MRN: [id number]  DOS:  05/13/2019 Type of visit - description: Virtual Visit via Video Note  I connected with@   by a video enabled telemedicine application and verified that I am speaking with the correct person using two identifiers.   THIS ENCOUNTER IS A VIRTUAL VISIT DUE TO COVID-19 - PATIENT WAS NOT SEEN IN THE OFFICE. PATIENT HAS CONSENTED TO VIRTUAL VISIT / TELEMEDICINE VISIT   Location of patient: home  Location of provider: office  I discussed the limitations of evaluation and management by telemedicine and the availability of in person appointments. The patient expressed understanding and agreed to proceed.  History of Present Illness:  Hospital follow-up The patient was admitted to the hospital and discharge 05/02/2019, DX COVID-19. Since then, she is doing better. Was recommended to hold lisinopril but she already restart and report her ambulatory BPs are normal.  No readings provided. She continue with tiredness and achiness but they are mild.  Review of Systems Denies fever chills No chest pain no difficulty breathing  Past Medical History:  Diagnosis Date  . Abnormal Pap smear of cervix   . Anemia   . Anxiety and depression   . Asthma   . Atrial fibrillation ([place]) 07/2017  . Borderline diabetes   . CAD (coronary artery disease) ~2009   stent   . Cervical cancer ([place])    "stage 1; had cryotherapy"  . Cervical dysplasia   . Chronic lower back pain   . DDD (degenerative disc disease), lumbar    Dr. Nelva Bush, recommends lumbar epidural steroid injections L5-S1 to the right  . Depression   . Fibromyalgia   . GERD (gastroesophageal reflux disease)   . Headache    "only when I'm short of breath"  (07/27/2017)  . High cholesterol   . History of hiatal hernia   . History of kidney stones   . Hypertension   . Hypothyroidism   . Insomnia 09/27/2013  . Lichen sclerosus    Vulvar area   . Osteoporosis   . Thyroid disease   . Vitamin D deficiency     Past Surgical History:  Procedure Laterality Date  . CARDIAC CATHETERIZATION  ~ 2008  . CARDIOVERSION N/A 08/05/2017   Procedure: CARDIOVERSION;  Surgeon: Evans Lance, MD;  Location: [facility];  Service: Cardiovascular;  Laterality: N/A;  . CARDIOVERSION N/A 08/24/2017   Procedure: CARDIOVERSION;  Surgeon: Sanda Klein, MD;  Location: [facility];  Service: Cardiovascular;  Laterality: N/A;  . [place]; ~ 1978/1979; 1984  . ESOPHAGOMYOTOMY  2009  . GYNECOLOGIC CRYOSURGERY  X 2   "stage 1 cancer"  . HERNIA REPAIR  X 6   "all in my stomach" (07/27/2017)  . KNEE ARTHROSCOPY Right   . NEPHRECTOMY Right 1991   in [place], due to fibrosis and lithiasis  . TUBAL LIGATION      Social History   Socioeconomic History  . Marital status: Married    Spouse name: Not on file  . Number of children: 3  . Years of education: Not on file  . Highest education level: Not on file  Occupational History  . 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 Financial resource strain: Not on file  . Food insecurity    Worry: Not on file    Inability: Not on file  . Transportation needs    Medical: Not on  file    Non-medical: Not on file  Tobacco Use  . Smoking status: Former Smoker    Packs/day: 2.00    Years: 28.00    Pack years: 56.00    Types: Cigarettes    Quit date: 2000    Years since quitting: 20.7  . Smokeless tobacco: Never Used  Substance and Sexual Activity  . Alcohol use: Yes    Comment: 07/27/2017 "3-4 drinks//month"  . Drug use: No  . Sexual activity: Not Currently    Comment: 1ST INTERCOURSE- 16, PARTNERS - 2  Lifestyle  . Physical activity    Days per week: Not on file    Minutes per session: Not on file  . Stress: Not on file  Relationships  . Social Herbalist on phone: Not on file    Gets together: Not on file    Attends religious service: Not on file    Active member of  club or organization: Not on file    Attends meetings of clubs or organizations: Not on file    Relationship status: Not on file  . Intimate partner violence    Fear of current or ex partner: Not on file    Emotionally abused: Not on file    Physically abused: Not on file    Forced sexual activity: Not on file  Other Topics Concern  . Not on file  Social History Narrative   Original from [place]   3 children, 2 alive   Household --pt and husband       Allergies as of 05/13/2019      Reactions   Penicillins Swelling, Rash   Has patient had a PCN reaction causing immediate rash, facial/tongue/throat swelling, SOB or lightheadedness with hypotension: No Has patient had a PCN reaction causing severe rash involving mucus membranes or skin necrosis: No Has patient had a PCN reaction that required hospitalization: No Has patient had a PCN reaction occurring within the last 10 years: No If all of the above answers are "NO", then may proceed with Cephalosporin use.      Medication List       Accurate as of May 13, 2019 11:51 AM. If you have any questions, ask your nurse or doctor.        allopurinol 100 MG tablet Commonly known as: ZYLOPRIM Take 0.5 tablets (50 mg total) by mouth daily. What changed:   how much to take  when to take this   atorvastatin 40 MG tablet Commonly known as: LIPITOR Take 1 tablet (40 mg total) by mouth at bedtime.   cyclobenzaprine 10 MG tablet Commonly known as: FLEXERIL Take 1 tablet (10 mg total) by mouth 2 (two) times daily as needed for muscle spasms.   diltiazem 120 MG tablet Commonly known as: CARDIZEM TAKE 1/2 TABLET BY MOUTH TWICE DAILY   DULoxetine 60 MG capsule Commonly known as: CYMBALTA Take 1 capsule (60 mg total) by mouth daily.   furosemide 20 MG tablet Commonly known as: LASIX Take 1 tablet (20 mg total) by mouth daily.   levothyroxine 125 MCG tablet Commonly known as: SYNTHROID TAKE ONE TABLET BY MOUTH DAILY  BEFORE BREAKFAST What changed: See the new instructions.   lisinopril 10 MG tablet Commonly known as: ZESTRIL Take 1 tablet (10 mg total) by mouth daily.   metFORMIN 500 MG tablet Commonly known as: GLUCOPHAGE Take 1 tablet (500 mg total) by mouth 2 (two) times daily with a meal.   omeprazole 20 MG  capsule Commonly known as: PRILOSEC Take 1 capsule (20 mg total) by mouth daily. What changed: when to take this   PRESCRIPTION MEDICATION 4 g See admin instructions. Testosterone 2 % cream  APPLY FOUR CLICKS (ONE GRAM) TO INNER THIGH DAILY. ROTATE   raloxifene 60 MG tablet Commonly known as: EVISTA Take 1 tablet (60 mg total) by mouth daily. What changed: when to take this   rivaroxaban 20 MG Tabs tablet Commonly known as: Xarelto Take 1 tablet (20 mg total) by mouth daily with supper.   SOTALOL AF 120 MG Tabs TAKE ONE TABLET BY MOUTH EVERY 12 HOURS   Vitamin D3 125 MCG (5000 UT) Caps Take 5,000 Units by mouth daily.   zolpidem 5 MG tablet Commonly known as: AMBIEN Take 1 tablet (5 mg total) by mouth at bedtime as needed. for sleep What changed: reasons to take this           Objective:   Physical Exam There were no vitals taken for this visit. This is started as a virtual video visit, the patient looked well, was alert and oriented x3 and in no distress.  Due to technical difficulties we switch to telephone visit.  She was suspected in complete sentences.    Assessment       Assessment   Hyperglycemia HTN High cholesterol Hypothyroidism Anxiety depression, insomnia CV: --Atrial fibrillation DX 06-2017, s/p cardioversions, intolerant to metoprolol (?) --CAD: stent ~ 2009 Morbid obesity: BMI 42 (2011) Vitamin D deficiency, saw Dr. Cruzita Lederer 07-2016, rtc 1 year, celiac dz test (-), unlikely  Malabsortion; cont supplements  GERD H/o achalasia, s/p [place] 2009 SINGLE KIDNEY---S/p R  Nephrectomy ([place] 1991 due to stones/fibrosis) MSK: --Fibromyalgia  --DJD  Knee pain R --Back pain -- s/p epidural 10-16 Osteoporosis: Used to be managed by gynecology, now PCP. 08-2014 T score -2.8, 09-2015: T score -2.7.- Was Rx Evista 09-2014  h/o cervical dysplasia Derm: vulvar pruritus, temovate prn ok per gyn  PLAN: COVID-19: The patient seem to be recuperating well, she was taking the steroids and she stopped. She has remained the tiredness and mild achiness, recommend observation, rest for now. She needs [place] follow-up CBC and BMP but we can do that when she comes back HTN: At the hospital was told to stop lisinopril but she already restarted, reports good ambulatory BPs.  She also takes diltiazem. RTC routine visit 2 weeks from today, will call the patient.     I discussed the assessment and treatment plan with the patient. The patient was provided an opportunity to ask questions and all were answered. The patient agreed with the plan and demonstrated an understanding of the instructions.   The patient was advised to call back or seek an in-person evaluation if the symptoms worsen or if the condition fails to improve as anticipated.

## 2019-05-14 ENCOUNTER — Telehealth: Payer: Self-pay | Admitting: Internal Medicine

## 2019-05-14 NOTE — Telephone Encounter (Signed)
Called pt to [facility] a 2 week follow up appt. Left msg

## 2019-05-14 NOTE — Assessment & Plan Note (Signed)
COVID-19: The patient seem to be recuperating well, she was taking the steroids and she stopped. She has remained the tiredness and mild achiness, recommend observation, rest for now. She needs [place] follow-up CBC and BMP but we can do that when she comes back HTN: At the hospital was told to stop lisinopril but she already restarted, reports good ambulatory BPs.  She also takes diltiazem. RTC routine visit 2 weeks from today, will call the patient.

## 2019-05-20 DIAGNOSIS — U071 COVID-19: Secondary | ICD-10-CM | POA: Diagnosis not present

## 2019-05-21 ENCOUNTER — Other Ambulatory Visit: Payer: Self-pay | Admitting: Internal Medicine

## 2019-05-24 ENCOUNTER — Encounter (HOSPITAL_BASED_OUTPATIENT_CLINIC_OR_DEPARTMENT_OTHER): Payer: Self-pay

## 2019-05-24 ENCOUNTER — Emergency Department (HOSPITAL_BASED_OUTPATIENT_CLINIC_OR_DEPARTMENT_OTHER): Payer: Medicare Other

## 2019-05-24 ENCOUNTER — Inpatient Hospital Stay (HOSPITAL_BASED_OUTPATIENT_CLINIC_OR_DEPARTMENT_OTHER)
Admission: EM | Admit: 2019-05-24 | Discharge: 2019-05-28 | DRG: 177 | Disposition: A | Discharge status: Home Health | Payer: Medicare Other | Attending: Internal Medicine | Admitting: Internal Medicine

## 2019-05-24 ENCOUNTER — Other Ambulatory Visit: Payer: Self-pay

## 2019-05-24 DIAGNOSIS — R1013 Epigastric pain: Secondary | ICD-10-CM

## 2019-05-24 DIAGNOSIS — E876 Hypokalemia: Secondary | ICD-10-CM

## 2019-05-24 DIAGNOSIS — I110 Hypertensive heart disease with heart failure: Secondary | ICD-10-CM | POA: Diagnosis present

## 2019-05-24 DIAGNOSIS — E860 Dehydration: Secondary | ICD-10-CM | POA: Diagnosis present

## 2019-05-24 DIAGNOSIS — I5022 Chronic systolic (congestive) heart failure: Secondary | ICD-10-CM | POA: Diagnosis not present

## 2019-05-24 DIAGNOSIS — Z6841 Body Mass Index (BMI) 40.0 and over, adult: Secondary | ICD-10-CM

## 2019-05-24 DIAGNOSIS — U071 COVID-19: Principal | ICD-10-CM

## 2019-05-24 DIAGNOSIS — I25119 Atherosclerotic heart disease of native coronary artery with unspecified angina pectoris: Secondary | ICD-10-CM | POA: Diagnosis not present

## 2019-05-24 DIAGNOSIS — R197 Diarrhea, unspecified: Secondary | ICD-10-CM | POA: Diagnosis not present

## 2019-05-24 DIAGNOSIS — K432 Incisional hernia without obstruction or gangrene: Secondary | ICD-10-CM | POA: Diagnosis present

## 2019-05-24 DIAGNOSIS — Z9861 Coronary angioplasty status: Secondary | ICD-10-CM

## 2019-05-24 DIAGNOSIS — E119 Type 2 diabetes mellitus without complications: Secondary | ICD-10-CM | POA: Diagnosis present

## 2019-05-24 DIAGNOSIS — J1282 Pneumonia due to coronavirus disease 2019: Secondary | ICD-10-CM | POA: Diagnosis present

## 2019-05-24 DIAGNOSIS — Z87891 Personal history of nicotine dependence: Secondary | ICD-10-CM

## 2019-05-24 DIAGNOSIS — Z79890 Hormone replacement therapy (postmenopausal): Secondary | ICD-10-CM

## 2019-05-24 DIAGNOSIS — I4891 Unspecified atrial fibrillation: Secondary | ICD-10-CM

## 2019-05-24 DIAGNOSIS — Z833 Family history of diabetes mellitus: Secondary | ICD-10-CM

## 2019-05-24 DIAGNOSIS — M810 Age-related osteoporosis without current pathological fracture: Secondary | ICD-10-CM | POA: Diagnosis present

## 2019-05-24 DIAGNOSIS — R0602 Shortness of breath: Secondary | ICD-10-CM | POA: Diagnosis not present

## 2019-05-24 DIAGNOSIS — E039 Hypothyroidism, unspecified: Secondary | ICD-10-CM | POA: Diagnosis not present

## 2019-05-24 DIAGNOSIS — Z23 Encounter for immunization: Secondary | ICD-10-CM

## 2019-05-24 DIAGNOSIS — E669 Obesity, unspecified: Secondary | ICD-10-CM | POA: Diagnosis present

## 2019-05-24 DIAGNOSIS — Z801 Family history of malignant neoplasm of trachea, bronchus and lung: Secondary | ICD-10-CM

## 2019-05-24 DIAGNOSIS — M797 Fibromyalgia: Secondary | ICD-10-CM | POA: Diagnosis not present

## 2019-05-24 DIAGNOSIS — Z8619 Personal history of other infectious and parasitic diseases: Secondary | ICD-10-CM

## 2019-05-24 DIAGNOSIS — Z823 Family history of stroke: Secondary | ICD-10-CM

## 2019-05-24 DIAGNOSIS — J1289 Other viral pneumonia: Secondary | ICD-10-CM | POA: Diagnosis not present

## 2019-05-24 DIAGNOSIS — Z7901 Long term (current) use of anticoagulants: Secondary | ICD-10-CM

## 2019-05-24 DIAGNOSIS — Z7984 Long term (current) use of oral hypoglycemic drugs: Secondary | ICD-10-CM

## 2019-05-24 DIAGNOSIS — F329 Major depressive disorder, single episode, unspecified: Secondary | ICD-10-CM | POA: Diagnosis present

## 2019-05-24 DIAGNOSIS — D649 Anemia, unspecified: Secondary | ICD-10-CM | POA: Diagnosis present

## 2019-05-24 DIAGNOSIS — K219 Gastro-esophageal reflux disease without esophagitis: Secondary | ICD-10-CM | POA: Diagnosis present

## 2019-05-24 DIAGNOSIS — Z8541 Personal history of malignant neoplasm of cervix uteri: Secondary | ICD-10-CM

## 2019-05-24 DIAGNOSIS — N179 Acute kidney failure, unspecified: Secondary | ICD-10-CM | POA: Diagnosis not present

## 2019-05-24 DIAGNOSIS — Z8249 Family history of ischemic heart disease and other diseases of the circulatory system: Secondary | ICD-10-CM

## 2019-05-24 DIAGNOSIS — Z905 Acquired absence of kidney: Secondary | ICD-10-CM

## 2019-05-24 DIAGNOSIS — K5289 Other specified noninfective gastroenteritis and colitis: Secondary | ICD-10-CM | POA: Diagnosis present

## 2019-05-24 DIAGNOSIS — I482 Chronic atrial fibrillation, unspecified: Secondary | ICD-10-CM | POA: Diagnosis present

## 2019-05-24 DIAGNOSIS — I2510 Atherosclerotic heart disease of native coronary artery without angina pectoris: Secondary | ICD-10-CM | POA: Diagnosis present

## 2019-05-24 DIAGNOSIS — I5021 Acute systolic (congestive) heart failure: Secondary | ICD-10-CM | POA: Diagnosis present

## 2019-05-24 LAB — CBC WITH DIFFERENTIAL/PLATELET
Abs Immature Granulocytes: 0.05 10*3/uL (ref 0.00–0.07)
Basophils Absolute: 0 10*3/uL (ref 0.0–0.1)
Basophils Relative: 1 %
Eosinophils Absolute: 0.3 10*3/uL (ref 0.0–0.5)
Eosinophils Relative: 4 %
HCT: 34.4 % — ABNORMAL LOW (ref 36.0–46.0)
Hemoglobin: 10.4 g/dL — ABNORMAL LOW (ref 12.0–15.0)
Immature Granulocytes: 1 %
Lymphocytes Relative: 20 %
Lymphs Abs: 1.3 10*3/uL (ref 0.7–4.0)
MCH: 28.2 pg (ref 26.0–34.0)
MCHC: 30.2 g/dL (ref 30.0–36.0)
MCV: 93.2 fL (ref 80.0–100.0)
Monocytes Absolute: 0.3 10*3/uL (ref 0.1–1.0)
Monocytes Relative: 5 %
Neutro Abs: 4.4 10*3/uL (ref 1.7–7.7)
Neutrophils Relative %: 69 %
Platelets: 193 10*3/uL (ref 150–400)
RBC: 3.69 MIL/uL — ABNORMAL LOW (ref 3.87–5.11)
RDW: 15.1 % (ref 11.5–15.5)
WBC: 6.3 10*3/uL (ref 4.0–10.5)
nRBC: 0 % (ref 0.0–0.2)

## 2019-05-24 LAB — LIPASE, BLOOD: Lipase: 21 U/L (ref 11–51)

## 2019-05-24 LAB — COMPREHENSIVE METABOLIC PANEL
ALT: 12 U/L (ref 0–44)
AST: 18 U/L (ref 15–41)
Albumin: 3 g/dL — ABNORMAL LOW (ref 3.5–5.0)
Alkaline Phosphatase: 92 U/L (ref 38–126)
Anion gap: 13 (ref 5–15)
BUN: 9 mg/dL (ref 8–23)
CO2: 20 mmol/L — ABNORMAL LOW (ref 22–32)
Calcium: 8.7 mg/dL — ABNORMAL LOW (ref 8.9–10.3)
Chloride: 105 mmol/L (ref 98–111)
Creatinine, Ser: 0.83 mg/dL (ref 0.44–1.00)
GFR calc Af Amer: >60 mL/min (ref >60)
GFR calc non Af Amer: >60 mL/min (ref >60)
Glucose, Bld: 245 mg/dL — ABNORMAL HIGH (ref 70–99)
Potassium: 3.3 mmol/L — ABNORMAL LOW (ref 3.5–5.1)
Sodium: 138 mmol/L (ref 135–145)
Total Bilirubin: 0.4 mg/dL (ref 0.3–1.2)
Total Protein: 6.5 g/dL (ref 6.5–8.1)

## 2019-05-24 LAB — LACTIC ACID, PLASMA: Lactic Acid, Venous: 2.9 mmol/L (ref 0.5–1.9)

## 2019-05-24 LAB — MAGNESIUM: Magnesium: 1.4 mg/dL — ABNORMAL LOW (ref 1.7–2.4)

## 2019-05-24 LAB — SARS CORONAVIRUS 2 BY RT PCR (HOSPITAL ORDER, PERFORMED IN ~~LOC~~ HOSPITAL LAB): SARS Coronavirus 2: POSITIVE — AB

## 2019-05-24 MED ORDER — AZTREONAM 1 G IJ SOLR
INTRAMUSCULAR | Status: AC
  Filled 2019-05-24: qty 2

## 2019-05-24 MED ORDER — VANCOMYCIN HCL 500 MG IV SOLR
INTRAVENOUS | Status: AC
  Filled 2019-05-24: qty 500

## 2019-05-24 MED ORDER — VANCOMYCIN HCL IN DEXTROSE 1-5 GM/200ML-% IV SOLN
1000.0000 mg | Freq: Once | INTRAVENOUS | Status: AC
  Administered 2019-05-25: 1000 mg via INTRAVENOUS
  Filled 2019-05-24: qty 200

## 2019-05-24 MED ORDER — IOHEXOL 300 MG/ML  SOLN
100.0000 mL | Freq: Once | INTRAMUSCULAR | Status: AC | PRN
  Administered 2019-05-24: 80 mL via INTRAVENOUS

## 2019-05-24 MED ORDER — SODIUM CHLORIDE 0.9 % IV BOLUS
1000.0000 mL | Freq: Once | INTRAVENOUS | Status: AC
  Administered 2019-05-24: 1000 mL via INTRAVENOUS

## 2019-05-24 MED ORDER — DILTIAZEM HCL 100 MG IV SOLR
5.0000 mg/h | INTRAVENOUS | Status: DC
  Administered 2019-05-24: 21:00:00 10 mg/h via INTRAVENOUS
  Filled 2019-05-24 (×2): qty 100

## 2019-05-24 MED ORDER — SODIUM CHLORIDE 0.9 % IV SOLN
2.0000 g | Freq: Once | INTRAVENOUS | Status: AC
  Administered 2019-05-24: 2 g via INTRAVENOUS
  Filled 2019-05-24: qty 2

## 2019-05-24 MED ORDER — DEXAMETHASONE SODIUM PHOSPHATE 10 MG/ML IJ SOLN
6.0000 mg | INTRAMUSCULAR | Status: DC
  Administered 2019-05-24: 6 mg via INTRAVENOUS
  Filled 2019-05-24: qty 1

## 2019-05-24 MED ORDER — METRONIDAZOLE IN NACL 5-0.79 MG/ML-% IV SOLN
500.0000 mg | Freq: Once | INTRAVENOUS | Status: AC
  Administered 2019-05-24: 500 mg via INTRAVENOUS
  Filled 2019-05-24: qty 100

## 2019-05-24 MED ORDER — AZTREONAM 2 G IJ SOLR
2.0000 g | Freq: Three times a day (TID) | INTRAMUSCULAR | Status: DC
  Filled 2019-05-24 (×2): qty 2

## 2019-05-24 MED ORDER — VANCOMYCIN HCL 1000 MG IV SOLR
INTRAVENOUS | Status: AC
  Filled 2019-05-24: qty 1000

## 2019-05-24 MED ORDER — DILTIAZEM LOAD VIA INFUSION
10.0000 mg | Freq: Once | INTRAVENOUS | Status: AC
  Administered 2019-05-24: 10 mg via INTRAVENOUS
  Filled 2019-05-24: qty 10

## 2019-05-24 MED ORDER — ONDANSETRON HCL 4 MG/2ML IJ SOLN
4.0000 mg | Freq: Once | INTRAMUSCULAR | Status: AC
  Administered 2019-05-24: 4 mg via INTRAVENOUS
  Filled 2019-05-24: qty 2

## 2019-05-24 MED ORDER — VANCOMYCIN HCL IN DEXTROSE 1-5 GM/200ML-% IV SOLN: 1000.0000 mg | Freq: Once | INTRAVENOUS | Status: DC

## 2019-05-24 MED ORDER — MORPHINE SULFATE (PF) 4 MG/ML IV SOLN
4.0000 mg | Freq: Once | INTRAVENOUS | Status: AC
  Administered 2019-05-24: 4 mg via INTRAVENOUS
  Filled 2019-05-24: qty 1

## 2019-05-24 MED ORDER — VANCOMYCIN HCL IN DEXTROSE 1-5 GM/200ML-% IV SOLN
1000.0000 mg | Freq: Once | INTRAVENOUS | Status: DC
  Filled 2019-05-24: qty 200

## 2019-05-24 MED ORDER — DILTIAZEM HCL 100 MG IV SOLR
INTRAVENOUS | Status: AC
  Administered 2019-05-24: 100 mg
  Filled 2019-05-24: qty 100

## 2019-05-24 MED ORDER — POTASSIUM CHLORIDE CRYS ER 20 MEQ PO TBCR
20.0000 meq | EXTENDED_RELEASE_TABLET | Freq: Four times a day (QID) | ORAL | Status: AC
  Administered 2019-05-24 – 2019-05-25 (×2): 20 meq via ORAL
  Filled 2019-05-24 (×2): qty 1

## 2019-05-24 MED ORDER — VANCOMYCIN HCL 10 G IV SOLR
1500.0000 mg | INTRAVENOUS | Status: DC
  Administered 2019-05-25: 1500 mg via INTRAVENOUS
  Filled 2019-05-24: qty 1500

## 2019-05-24 NOTE — Progress Notes (Addendum)
Pharmacy Antibiotic Note  Melinda Mckinney is a 63 y.o. female admitted on 05/24/2019 with sepsis. Pt presents with abdominal pain, diarrhea, an an elevated lactate at 2.9. WBC is wnls at 6.3. She also has a penicllin allergy (swelling, rash). Pt's blood glucose is also elevated. Pharmacy has been consulted for vancomycin and aztreonam dosing.  Plan: Vancomycin 2000 mg IV X 1 as loading dose Vancomycin 1500 mg IV Q 24 hrs. Goal AUC 400-550. Expected AUC: 470.7 SCr used: 0.83 Aztreonam IV 2g q8 hours Monitor pt's renal function, clinical status, WBC, Temp, and lactate     Temp (24hrs), Avg:98.6 F (37 C), Min:98.6 F (37 C), Max:98.6 F (37 C)  Recent Labs  Lab 05/24/19 2028  WBC 6.3  CREATININE 0.83  LATICACIDVEN 2.9*    CrCl cannot be calculated (Unknown ideal weight.).    Allergies  Allergen Reactions  . Penicillins Swelling and Rash    Has patient had a PCN reaction causing immediate rash, facial/tongue/throat swelling, SOB or lightheadedness with hypotension: No Has patient had a PCN reaction causing severe rash involving mucus membranes or skin necrosis: No Has patient had a PCN reaction that required hospitalization: No Has patient had a PCN reaction occurring within the last 10 years: No If all of the above answers are "NO", then may proceed with Cephalosporin use.    Antimicrobials this admission: Vancomycin 10/09 >>  Aztreonam 10/09 >>   Microbiology results: 10/09 BCx: Sent 10/09 UCx: Sent 10/09 COVID: Sent  Thank you for allowing pharmacy to be a part of this patient's care.  Werner Lean 05/24/2019 9:16 PM

## 2019-05-24 NOTE — ED Triage Notes (Addendum)
Pt with abd pain diarrhea-pt with panting/grunting resp-holding her abd-pt speaks very little english-states husband is en route and wants to wait for him for interpretation-husband added that pt with abd pain diarrhea after she eats x 2 weeks-worse x 5 days-states she and he both were covid pos in Sept then covid neg test 10/5

## 2019-05-24 NOTE — ED Provider Notes (Signed)
Emergency Department Provider Note   I have reviewed the triage vital signs and the nursing notes.   HISTORY  Chief Complaint Abdominal Pain   HPI Melinda Mckinney is a 63 y.o. female with past medical history reviewed below including recent COVID-19 infection and hospitalization presents to the emergency department for evaluation of abdominal pain with diarrhea and shortness of breath.  The husband states that symptoms have been ongoing for the past 5 days.  Patient denies chest pain.  She states that her shortness of breath seems mostly related to her abdominal discomfort.  No fevers but has been states that she has been somewhat diaphoretic at times.  No cough or congestion.  Patient had a negative COVID test on 10/5 along with her husband.  She denies any UTI symptoms or back/flank pain.   Past Medical History:  Diagnosis Date   Abnormal Pap smear of cervix    Anemia    Anxiety and depression    Asthma    Atrial fibrillation ([place]) 07/2017   Borderline diabetes    CAD (coronary artery disease) ~2009   stent    Cervical cancer ([place])    "stage 1; had cryotherapy"   Cervical dysplasia    Chronic lower back pain    DDD (degenerative disc disease), lumbar    Dr. Nelva Bush, recommends lumbar epidural steroid injections L5-S1 to the right   Depression    Fibromyalgia    GERD (gastroesophageal reflux disease)    Headache    "only when I'm short of breath"  (07/27/2017)   High cholesterol    History of hiatal hernia    History of kidney stones    Hypertension    Hypothyroidism    Insomnia [id number]   Lichen sclerosus    Vulvar area   Osteoporosis    Thyroid disease    Vitamin D deficiency     Patient Active Problem List   Diagnosis Date Noted   Pneumonia due to COVID-19 virus 05/25/2019   Hypokalemia 05/25/2019   Hypomagnesemia 05/25/2019   COVID-19 05/01/2019   2019 novel coronavirus disease (COVID-19) 04/30/2019   Encounter for monitoring  sotalol therapy [id number]   Chronic systolic CHF (congestive heart failure) ([place]) 07/27/2017   Acute congestive heart failure ([place]) 07/27/2017   Precordial pain    Atrial fibrillation with RVR ([place]) 06/29/2017   Hyperglycemia 01/06/2017   PCP NOTES >>>> 06/16/2015   Back pain 02/05/2015   Hypothyroidism 01/19/2015   Vitamin D deficiency    Vulvar atrophy 08/20/2014   Annual physical exam 07/24/2014   Anemia 07/24/2014   Insomnia 09/27/2013   Intertrigo 09/27/2013   Urolithiasis 09/27/2013   Essential hypertension    GERD, h/o achalasia s/p heller ~2009)    Hyperlipidemia LDL goal <70    Coronary artery disease involving native coronary artery of native heart with angina pectoris ([place])    Osteoporosis    Fibromyalgia     Past Surgical History:  Procedure Laterality Date   CARDIAC CATHETERIZATION  ~ 2008   CARDIOVERSION N/A 08/05/2017   Procedure: CARDIOVERSION;  Surgeon: Evans Lance, MD;  Location: [facility];  Service: Cardiovascular;  Laterality: N/A;   CARDIOVERSION N/A 08/24/2017   Procedure: CARDIOVERSION;  Surgeon: Sanda Klein, MD;  Location: [facility];  Service: Cardiovascular;  Laterality: N/A;   [place]; ~ 1978/1979; 1984   ESOPHAGOMYOTOMY  2009   GYNECOLOGIC CRYOSURGERY  X 2   "stage 1 cancer"   HERNIA REPAIR  X 6   "  all in my stomach" (07/27/2017)   KNEE ARTHROSCOPY Right    NEPHRECTOMY Right 1991   in [place], due to fibrosis and lithiasis   TUBAL LIGATION      Allergies Penicillins  Family History  Problem Relation Age of Onset   Breast cancer Other        aunt    CAD Brother    Lung cancer Mother        F and M   Diabetes Father        F and mother    CAD Father    Lung cancer Father    Stroke Sister    Colon cancer Neg Hx     Social History Social History   Tobacco Use   Smoking status: Former Smoker    Packs/day: 2.00    Years: 28.00    Pack years: 56.00    Types:  Cigarettes    Quit date: 2000    Years since quitting: 20.7   Smokeless tobacco: Never Used  Substance Use Topics   Alcohol use: Yes    Comment: 07/27/2017 "3-4 drinks//month"   Drug use: No    Review of Systems  Constitutional: Positive fever.  Eyes: No visual changes. ENT: No sore throat. Cardiovascular: Denies chest pain. Respiratory: Denies shortness of breath. Gastrointestinal: Positive abdominal pain. Positive nausea and vomiting.  No diarrhea.  No constipation. Genitourinary: Negative for dysuria. Musculoskeletal: Negative for back pain. Skin: Negative for rash. Neurological: Negative for headaches, focal weakness or numbness.  10-point ROS otherwise negative.  ____________________________________________   PHYSICAL EXAM:  VITAL SIGNS: ED Triage Vitals [05/24/19 2010]  Enc Vitals Group     BP 111/82     Pulse Rate (!) 150     Resp (!) 24     Temp 98.6 F (37 C)     Temp Source Oral     SpO2 96 %   Constitutional: Alert and oriented. Well appearing and in no acute distress. Eyes: Conjunctivae are normal.  Head: Atraumatic. Nose: No congestion/rhinnorhea. Mouth/Throat: Mucous membranes are moist.  Neck: No stridor.  Cardiovascular: A-fib with RVR. Good peripheral circulation. Grossly normal heart sounds.   Respiratory: Increased respiratory effort.  No retractions. Lungs CTAB. Gastrointestinal: Soft with epigastric and LUQ tenderness. No distention.  Musculoskeletal: No lower extremity tenderness nor edema. No gross deformities of extremities. Neurologic:  Normal speech and language. No gross focal neurologic deficits are appreciated.  Skin:  Skin is warm, dry and intact. No rash noted.  ____________________________________________   LABS (all labs ordered are listed, but only abnormal results are displayed)  Labs Reviewed  SARS CORONAVIRUS 2 BY RT PCR ([facility]) - Abnormal; Notable for the following  components:      Result Value   SARS Coronavirus 2 POSITIVE (*)    All other components within normal limits  COMPREHENSIVE METABOLIC PANEL - Abnormal; Notable for the following components:   Potassium 3.3 (*)    CO2 20 (*)    Glucose, Bld 245 (*)    Calcium 8.7 (*)    Albumin 3.0 (*)    All other components within normal limits  LACTIC ACID, PLASMA - Abnormal; Notable for the following components:   Lactic Acid, Venous 2.9 (*)    All other components within normal limits  CBC WITH DIFFERENTIAL/PLATELET - Abnormal; Notable for the following components:   RBC 3.69 (*)    Hemoglobin 10.4 (*)    HCT 34.4 (*)  All other components within normal limits  MAGNESIUM - Abnormal; Notable for the following components:   Magnesium 1.4 (*)    All other components within normal limits  BRAIN NATRIURETIC PEPTIDE - Abnormal; Notable for the following components:   B Natriuretic Peptide 205.9 (*)    All other components within normal limits  CBC WITH DIFFERENTIAL/PLATELET - Abnormal; Notable for the following components:   RBC 3.52 (*)    Hemoglobin 10.1 (*)    HCT 33.1 (*)    Lymphs Abs 0.6 (*)    Abs Immature Granulocytes 0.08 (*)    All other components within normal limits  COMPREHENSIVE METABOLIC PANEL - Abnormal; Notable for the following components:   CO2 20 (*)    Glucose, Bld 214 (*)    Calcium 8.4 (*)    Albumin 3.1 (*)    AST 13 (*)    All other components within normal limits  C-REACTIVE PROTEIN - Abnormal; Notable for the following components:   CRP 11.1 (*)    All other components within normal limits  D-DIMER, QUANTITATIVE (NOT AT [facility]) - Abnormal; Notable for the following components:   D-Dimer, Quant 0.55 (*)    All other components within normal limits  GLUCOSE, CAPILLARY - Abnormal; Notable for the following components:   Glucose-Capillary 210 (*)    All other components within normal limits  GLUCOSE, CAPILLARY - Abnormal; Notable for the following components:    Glucose-Capillary 195 (*)    All other components within normal limits  CULTURE, BLOOD (ROUTINE X 2)  CULTURE, BLOOD (ROUTINE X 2)  RESPIRATORY PANEL BY PCR  LIPASE, BLOOD  TSH  PHOSPHORUS  FERRITIN  PROCALCITONIN  CBC WITH DIFFERENTIAL/PLATELET  LEGIONELLA PNEUMOPHILA SEROGP 1 UR AG   ____________________________________________  EKG   EKG Interpretation  Date/Time:  Friday May 24 2019 21:35:41 EDT Ventricular Rate:  92 PR Interval:    QRS Duration: 89 QT Interval:  347 QTC Calculation: 430 R Axis:   10 Text Interpretation:  Atrial fibrillation Rate improved. No STEMI  Confirmed by Nanda Quinton [phone]) on 05/24/2019 10:55:01 PM       ____________________________________________  RADIOLOGY  Ct Abdomen Pelvis W Contrast  Result Date: 05/24/2019 CLINICAL DATA:  An acute generalized abdominal pain diarrhea EXAM: CT ABDOMEN AND PELVIS WITH CONTRAST TECHNIQUE: Multidetector CT imaging of the abdomen and pelvis was performed using the standard protocol following bolus administration of intravenous contrast. CONTRAST:  22mL OMNIPAQUE IOHEXOL 300 MG/ML  SOLN COMPARISON:  CT 07/30/2012 FINDINGS: Lower chest: Multifocal airspace consolidation and surrounding ground-glass opacity in both lungs with bilateral effusions. Bandlike opacities likely reflect associated atelectasis and/or scarring. Thickening and fluid-filled distal airways are noted. Cardiomegaly with biatrial enlargement. No pericardial effusion. Atherosclerotic calcification of the coronary arteries. Hepatobiliary: No focal liver abnormality is seen. No gallstones, gallbladder wall thickening, or biliary dilatation. Pancreas: Unremarkable. No pancreatic ductal dilatation or surrounding inflammatory changes. Spleen: Few punctate calcifications present within the spleen. Spleen otherwise unremarkable. Adrenals/Urinary Tract: Normal adrenal glands. Surgical absence of the right kidney. No abnormal soft tissue in the nephrectomy  bed. Mild lobulation of the left kidney without concerning renal mass, calculus or hydronephrosis. No urinary tract dilatation. Urinary bladder is unremarkable. Stomach/Bowel: Distal esophagus, stomach and duodenal sweep are unremarkable. No small bowel wall thickening or dilatation. Interposition of several small bowel loops and portion of the colon anterior to the liver. No resulting obstruction. A normal appendix is visualized. No colonic dilatation or wall thickening. Vascular/Lymphatic: Atherosclerotic plaque within the normal  caliber aorta. No suspicious or enlarged lymph nodes in the included lymphatic chains. Reproductive: Normal appearance of the uterus and adnexal structures. Postsurgical changes from prior Caesarean section. Other: Postsurgical changes of the low anterior abdomen, likely related prior Caesarean section within enlarging incisional hernia involving the right lateral aspect of the incisional line. Herniated portion of the fat now measures approximately 13.4 x 3.3 cm in size. Hernia defect measures approximately 3.5 by 1.4 cm transverse by craniocaudal. No abnormal stranding within the herniated fat. No bowel containing hernia. No free fluid or free air within the abdomen or pelvis. Musculoskeletal: Mild degenerative changes in the spine most pronounced at L5-S1. No acute osseous abnormality or suspicious osseous lesion. IMPRESSION: 1. Multifocal airspace consolidation and surrounding ground-glass opacity in both lungs with bilateral effusions, concerning for multifocal pneumonia including possible atypical viral etiologies such as COVID-19. 2. No acute intra-abdominal process. 3. Postsurgical changes from prior Caesarean section within enlarging fat containing incisional hernia involving the right lateral aspect of the incisional line. No abnormal stranding within the herniated fat. 4. Aortic Atherosclerosis (ICD10-I70.0). Electronically Signed   By: Lovena Le M.D.   On: 05/24/2019 22:41     Dg Chest Portable 1 View  Result Date: 05/24/2019 CLINICAL DATA:  Shortness of breath, abdominal pain and diarrhea EXAM: PORTABLE CHEST 1 VIEW COMPARISON:  Radiograph 04/30/2019. Same-day CT abdomen pelvis. FINDINGS: Cardiomegaly, as seen on CT. Increased from comparison studies. Widespread bilateral multifocal airspace opacities throughout both lungs with associated volume loss. Partial obscuration of the hemidiaphragm suggest the pleural effusions also seen on CT. No pneumothorax. No acute osseous or soft tissue abnormality. IMPRESSION: 1. Widespread bilateral multifocal airspace opacities with associated volume loss, consistent with multifocal pneumonia including potential atypical viral etiologies such as COVID-19. 2. New cardiomegaly. Electronically Signed   By: Lovena Le M.D.   On: 05/24/2019 22:44    ____________________________________________   PROCEDURES  Procedure(s) performed:   Procedures  CRITICAL CARE Performed by: Margette Fast Total critical care time: 35 minutes Critical care time was exclusive of separately billable procedures and treating other patients. Critical care was necessary to treat or prevent imminent or life-threatening deterioration. Critical care was time spent personally by me on the following activities: development of treatment plan with patient and/or surrogate as well as nursing, discussions with consultants, evaluation of patient's response to treatment, examination of patient, obtaining history from patient or surrogate, ordering and performing treatments and interventions, ordering and review of laboratory studies, ordering and review of radiographic studies, pulse oximetry and re-evaluation of patient's condition.  Nanda Quinton, MD Emergency Medicine  ____________________________________________   INITIAL IMPRESSION / ASSESSMENT AND PLAN / ED COURSE  Pertinent labs & imaging results that were available during my care of the patient were  reviewed by me and considered in my medical decision making (see chart for details).   Patient presents to the emergency department for evaluation of abdominal pain with diarrhea.  She has some shortness of breath but no hypoxemia.  A. fib with RVR noted on arrival.  Will start diltiazem infusion, IV fluids, pain/nausea medication.  Patient with some focal tenderness in the epigastric and left upper quadrant.  No rebound or guarding.  Lungs are clear.  Plan to repeat COVID-19 testing and obtain x-ray along with CT abdomen pelvis.  Patient CT scan reviewed with no acute abdominal findings.  CT does show a multifocal pneumonia.  Patient was covered initially with empiric antibiotics with elevated lactate which would cover for any secondary  bacterial infection.  COVID test remains positive here.   Discussed patient's case with [facility], Dr. Myna Hidalgo to request admission. Patient and family (if present) updated with plan. Care transferred to [facility] service.  I reviewed all nursing notes, vitals, pertinent old records, EKGs, labs, imaging (as available).  ____________________________________________  FINAL CLINICAL IMPRESSION(S) / ED DIAGNOSES  Final diagnoses:  Atrial fibrillation with RVR ([place])  COVID-19  Epigastric pain     MEDICATIONS GIVEN DURING THIS VISIT:  Medications  vancomycin (VANCOCIN) 1000 MG powder (has no administration in time range)  vancomycin (VANCOCIN) 500 MG powder (has no administration in time range)  aztreonam (AZACTAM) 1 g injection (has no administration in time range)  allopurinol (ZYLOPRIM) tablet 100 mg (has no administration in time range)  atorvastatin (LIPITOR) tablet 40 mg (has no administration in time range)  sotalol (BETAPACE) tablet 120 mg (120 mg Oral Given 05/25/19 0850)  DULoxetine (CYMBALTA) DR capsule 60 mg (60 mg Oral Given 05/25/19 0834)  zolpidem (AMBIEN) tablet 5 mg (has no administration in time range)  levothyroxine (SYNTHROID) tablet 125 mcg (125 mcg  Oral Given 05/25/19 0520)  pantoprazole (PROTONIX) EC tablet 40 mg (40 mg Oral Given 05/25/19 0835)  rivaroxaban (XARELTO) tablet 20 mg (has no administration in time range)  cyclobenzaprine (FLEXERIL) tablet 10 mg (has no administration in time range)  lisinopril (ZESTRIL) tablet 10 mg (10 mg Oral Given 05/25/19 0835)  insulin aspart (novoLOG) injection 0-9 Units (2 Units Subcutaneous Given 05/25/19 1220)  insulin aspart (novoLOG) injection 0-5 Units (has no administration in time range)  sodium chloride flush (NS) 0.9 % injection 3 mL (3 mLs Intravenous Given 05/25/19 0835)  sodium chloride flush (NS) 0.9 % injection 3 mL (3 mLs Intravenous Given 05/25/19 0835)  sodium chloride flush (NS) 0.9 % injection 3 mL (has no administration in time range)  0.9 %  sodium chloride infusion (has no administration in time range)  acetaminophen (TYLENOL) tablet 650 mg (has no administration in time range)  HYDROcodone-acetaminophen (NORCO/VICODIN) 5-325 MG per tablet 1-2 tablet (has no administration in time range)  ondansetron (ZOFRAN) tablet 4 mg (has no administration in time range)    Or  ondansetron (ZOFRAN) injection 4 mg (has no administration in time range)  morphine 2 MG/ML injection 3 mg (3 mg Intravenous Given 05/25/19 0440)  diltiazem (CARDIZEM) 100 mg in dextrose 5 % 100 mL infusion (0 mg/hr Intravenous Stopped 05/25/19 1222)  influenza vaccine adjuvanted (FLUAD) injection 0.5 mL (has no administration in time range)  pneumococcal 23 valent vaccine (PNU-IMMUNE) injection 0.5 mL (has no administration in time range)  diltiazem (CARDIZEM) tablet 30 mg (30 mg Oral Given 05/25/19 1219)  methylPREDNISolone sodium succinate (SOLU-MEDROL) 40 mg/mL injection 40 mg (40 mg Intravenous Given 05/25/19 1422)  sodium chloride 0.9 % bolus 1,000 mL (0 mLs Intravenous Stopped 05/25/19 0207)  morphine 4 MG/ML injection 4 mg (4 mg Intravenous Given 05/24/19 2039)  ondansetron (ZOFRAN) injection 4 mg (4 mg  Intravenous Given 05/24/19 2039)  diltiazem (CARDIZEM) 1 mg/mL load via infusion 10 mg (10 mg Intravenous Bolus from Bag 05/24/19 2131)  diltiazem (CARDIZEM) 100 MG injection (100 mg  Given 05/24/19 2044)  aztreonam (AZACTAM) 2 g in sodium chloride 0.9 % 100 mL IVPB ( Intravenous Stopped 05/24/19 2204)  metroNIDAZOLE (FLAGYL) IVPB 500 mg (0 mg Intravenous Stopped 05/25/19 0037)  vancomycin (VANCOCIN) IVPB 1000 mg/200 mL premix ( Intravenous Stopped 05/25/19 0148)  iohexol (OMNIPAQUE) 300 MG/ML solution 100 mL (80 mLs Intravenous Contrast Given 05/24/19 2210)  potassium chloride SA (KLOR-CON) CR tablet 20 mEq (20 mEq Oral Given 05/25/19 0439)  diltiazem (CARDIZEM) 100 MG injection (  Given by Other 05/25/19 1341)  magnesium sulfate IVPB 2 g 50 mL (0 g Intravenous Stopped 05/25/19 0540)  furosemide (LASIX) injection 40 mg (40 mg Intravenous Given 05/25/19 1421)    Note:  This document was prepared using Dragon voice recognition software and may include unintentional dictation errors.  Nanda Quinton, MD, [facility] Emergency Medicine    Haleema Vanderheyden, [place], MD 05/25/19 [phone]

## 2019-05-24 NOTE — ED Notes (Signed)
Date and time results received: 05/24/19 2057 (use smartphrase ".now" to insert current time)  Test: Latic Acid Critical Value: 2.9  Name of Provider Notified: Dr. Laverta Baltimore  Orders Received? Or Actions Taken?: Actions Taken: Provider notified

## 2019-05-24 NOTE — ED Notes (Signed)
ED Provider at bedside. 

## 2019-05-25 ENCOUNTER — Encounter (HOSPITAL_COMMUNITY): Payer: Self-pay | Admitting: Family Medicine

## 2019-05-25 DIAGNOSIS — E876 Hypokalemia: Secondary | ICD-10-CM | POA: Diagnosis present

## 2019-05-25 DIAGNOSIS — U071 COVID-19: Secondary | ICD-10-CM | POA: Diagnosis not present

## 2019-05-25 DIAGNOSIS — Z6841 Body Mass Index (BMI) 40.0 and over, adult: Secondary | ICD-10-CM | POA: Diagnosis not present

## 2019-05-25 DIAGNOSIS — E119 Type 2 diabetes mellitus without complications: Secondary | ICD-10-CM | POA: Diagnosis present

## 2019-05-25 DIAGNOSIS — Z7901 Long term (current) use of anticoagulants: Secondary | ICD-10-CM | POA: Diagnosis not present

## 2019-05-25 DIAGNOSIS — K219 Gastro-esophageal reflux disease without esophagitis: Secondary | ICD-10-CM | POA: Diagnosis present

## 2019-05-25 DIAGNOSIS — I110 Hypertensive heart disease with heart failure: Secondary | ICD-10-CM | POA: Diagnosis present

## 2019-05-25 DIAGNOSIS — K5289 Other specified noninfective gastroenteritis and colitis: Secondary | ICD-10-CM | POA: Diagnosis present

## 2019-05-25 DIAGNOSIS — Z79890 Hormone replacement therapy (postmenopausal): Secondary | ICD-10-CM | POA: Diagnosis not present

## 2019-05-25 DIAGNOSIS — E039 Hypothyroidism, unspecified: Secondary | ICD-10-CM | POA: Diagnosis present

## 2019-05-25 DIAGNOSIS — Z8541 Personal history of malignant neoplasm of cervix uteri: Secondary | ICD-10-CM | POA: Diagnosis not present

## 2019-05-25 DIAGNOSIS — M810 Age-related osteoporosis without current pathological fracture: Secondary | ICD-10-CM | POA: Diagnosis present

## 2019-05-25 DIAGNOSIS — F329 Major depressive disorder, single episode, unspecified: Secondary | ICD-10-CM | POA: Diagnosis present

## 2019-05-25 DIAGNOSIS — M797 Fibromyalgia: Secondary | ICD-10-CM | POA: Diagnosis not present

## 2019-05-25 DIAGNOSIS — D649 Anemia, unspecified: Secondary | ICD-10-CM | POA: Diagnosis present

## 2019-05-25 DIAGNOSIS — Z7984 Long term (current) use of oral hypoglycemic drugs: Secondary | ICD-10-CM | POA: Diagnosis not present

## 2019-05-25 DIAGNOSIS — N179 Acute kidney failure, unspecified: Secondary | ICD-10-CM | POA: Diagnosis not present

## 2019-05-25 DIAGNOSIS — Z87891 Personal history of nicotine dependence: Secondary | ICD-10-CM | POA: Diagnosis not present

## 2019-05-25 DIAGNOSIS — I4891 Unspecified atrial fibrillation: Secondary | ICD-10-CM | POA: Diagnosis not present

## 2019-05-25 DIAGNOSIS — Z905 Acquired absence of kidney: Secondary | ICD-10-CM | POA: Diagnosis not present

## 2019-05-25 DIAGNOSIS — J1282 Pneumonia due to coronavirus disease 2019: Secondary | ICD-10-CM | POA: Diagnosis present

## 2019-05-25 DIAGNOSIS — I5022 Chronic systolic (congestive) heart failure: Secondary | ICD-10-CM | POA: Diagnosis not present

## 2019-05-25 DIAGNOSIS — Z23 Encounter for immunization: Secondary | ICD-10-CM | POA: Diagnosis not present

## 2019-05-25 DIAGNOSIS — I25119 Atherosclerotic heart disease of native coronary artery with unspecified angina pectoris: Secondary | ICD-10-CM | POA: Diagnosis not present

## 2019-05-25 DIAGNOSIS — I482 Chronic atrial fibrillation, unspecified: Secondary | ICD-10-CM | POA: Diagnosis present

## 2019-05-25 DIAGNOSIS — J1289 Other viral pneumonia: Secondary | ICD-10-CM | POA: Diagnosis not present

## 2019-05-25 LAB — COMPREHENSIVE METABOLIC PANEL
ALT: 12 U/L (ref 0–44)
AST: 13 U/L — ABNORMAL LOW (ref 15–41)
Albumin: 3.1 g/dL — ABNORMAL LOW (ref 3.5–5.0)
Alkaline Phosphatase: 96 U/L (ref 38–126)
Anion gap: 10 (ref 5–15)
BUN: 9 mg/dL (ref 8–23)
CO2: 20 mmol/L — ABNORMAL LOW (ref 22–32)
Calcium: 8.4 mg/dL — ABNORMAL LOW (ref 8.9–10.3)
Chloride: 108 mmol/L (ref 98–111)
Creatinine, Ser: 0.79 mg/dL (ref 0.44–1.00)
GFR calc Af Amer: >60 mL/min (ref >60)
GFR calc non Af Amer: >60 mL/min (ref >60)
Glucose, Bld: 214 mg/dL — ABNORMAL HIGH (ref 70–99)
Potassium: 4.4 mmol/L (ref 3.5–5.1)
Sodium: 138 mmol/L (ref 135–145)
Total Bilirubin: 0.6 mg/dL (ref 0.3–1.2)
Total Protein: 6.5 g/dL (ref 6.5–8.1)

## 2019-05-25 LAB — CBC WITH DIFFERENTIAL/PLATELET
Abs Immature Granulocytes: 0.08 10*3/uL — ABNORMAL HIGH (ref 0.00–0.07)
Basophils Absolute: 0 10*3/uL (ref 0.0–0.1)
Basophils Relative: 0 %
Eosinophils Absolute: 0 10*3/uL (ref 0.0–0.5)
Eosinophils Relative: 0 %
HCT: 33.1 % — ABNORMAL LOW (ref 36.0–46.0)
Hemoglobin: 10.1 g/dL — ABNORMAL LOW (ref 12.0–15.0)
Immature Granulocytes: 1 %
Lymphocytes Relative: 8 %
Lymphs Abs: 0.6 10*3/uL — ABNORMAL LOW (ref 0.7–4.0)
MCH: 28.7 pg (ref 26.0–34.0)
MCHC: 30.5 g/dL (ref 30.0–36.0)
MCV: 94 fL (ref 80.0–100.0)
Monocytes Absolute: 0.1 10*3/uL (ref 0.1–1.0)
Monocytes Relative: 2 %
Neutro Abs: 6.8 10*3/uL (ref 1.7–7.7)
Neutrophils Relative %: 89 %
Platelets: 208 10*3/uL (ref 150–400)
RBC: 3.52 MIL/uL — ABNORMAL LOW (ref 3.87–5.11)
RDW: 15.3 % (ref 11.5–15.5)
WBC: 7.7 10*3/uL (ref 4.0–10.5)
nRBC: 0 % (ref 0.0–0.2)

## 2019-05-25 LAB — C-REACTIVE PROTEIN: CRP: 11.1 mg/dL — ABNORMAL HIGH (ref <1.0)

## 2019-05-25 LAB — D-DIMER, QUANTITATIVE: D-Dimer, Quant: 0.55 ug/mL-FEU — ABNORMAL HIGH (ref 0.00–0.50)

## 2019-05-25 LAB — BRAIN NATRIURETIC PEPTIDE: B Natriuretic Peptide: 205.9 pg/mL — ABNORMAL HIGH (ref 0.0–100.0)

## 2019-05-25 LAB — FERRITIN: Ferritin: 44 ng/mL (ref 11–307)

## 2019-05-25 LAB — GLUCOSE, CAPILLARY
Glucose-Capillary: 210 mg/dL — ABNORMAL HIGH (ref 70–99)
Glucose-Capillary: 195 mg/dL — ABNORMAL HIGH (ref 70–99)
Glucose-Capillary: 206 mg/dL — ABNORMAL HIGH (ref 70–99)
Glucose-Capillary: 250 mg/dL — ABNORMAL HIGH (ref 70–99)
Glucose-Capillary: 244 mg/dL — ABNORMAL HIGH (ref 70–99)

## 2019-05-25 LAB — PROCALCITONIN: Procalcitonin: <0.1 ng/mL

## 2019-05-25 LAB — PHOSPHORUS: Phosphorus: 2.6 mg/dL (ref 2.5–4.6)

## 2019-05-25 LAB — TSH: TSH: 1.627 u[IU]/mL (ref 0.350–4.500)

## 2019-05-25 MED ORDER — DILTIAZEM HCL 30 MG PO TABS
30.0000 mg | ORAL_TABLET | Freq: Four times a day (QID) | ORAL | Status: DC
  Administered 2019-05-25 (×3): 30 mg via ORAL
  Filled 2019-05-25 (×5): qty 1

## 2019-05-25 MED ORDER — PANTOPRAZOLE SODIUM 40 MG PO TBEC
40.0000 mg | DELAYED_RELEASE_TABLET | Freq: Every day | ORAL | Status: DC
  Administered 2019-05-25 – 2019-05-28 (×4): 40 mg via ORAL
  Filled 2019-05-25 (×4): qty 1

## 2019-05-25 MED ORDER — FUROSEMIDE 10 MG/ML IJ SOLN
40.0000 mg | Freq: Once | INTRAMUSCULAR | Status: AC
  Administered 2019-05-25: 40 mg via INTRAVENOUS
  Filled 2019-05-25: qty 4

## 2019-05-25 MED ORDER — MORPHINE SULFATE (PF) 2 MG/ML IV SOLN
3.0000 mg | INTRAVENOUS | Status: DC | PRN
  Administered 2019-05-25: 3 mg via INTRAVENOUS
  Filled 2019-05-25: qty 2

## 2019-05-25 MED ORDER — DILTIAZEM HCL-DEXTROSE 125-5 MG/125ML-% IV SOLN (PREMIX): 5.0000 mg/h | INTRAVENOUS | Status: DC

## 2019-05-25 MED ORDER — CYCLOBENZAPRINE HCL 5 MG PO TABS
10.0000 mg | ORAL_TABLET | Freq: Two times a day (BID) | ORAL | Status: DC | PRN
  Administered 2019-05-26: 10 mg via ORAL
  Filled 2019-05-25: qty 2
  Filled 2019-05-25: qty 1

## 2019-05-25 MED ORDER — DULOXETINE HCL 60 MG PO CPEP
60.0000 mg | ORAL_CAPSULE | Freq: Every day | ORAL | Status: DC
  Administered 2019-05-25 – 2019-05-28 (×4): 60 mg via ORAL
  Filled 2019-05-25 (×5): qty 1

## 2019-05-25 MED ORDER — ACETAMINOPHEN 325 MG PO TABS: 650.0000 mg | ORAL_TABLET | Freq: Four times a day (QID) | ORAL | Status: DC | PRN

## 2019-05-25 MED ORDER — PNEUMOCOCCAL VAC POLYVALENT 25 MCG/0.5ML IJ INJ
0.5000 mL | INJECTION | INTRAMUSCULAR | Status: AC
  Administered 2019-05-26: 0.5 mL via INTRAMUSCULAR
  Filled 2019-05-25: qty 0.5

## 2019-05-25 MED ORDER — MAGNESIUM SULFATE 2 GM/50ML IV SOLN
2.0000 g | Freq: Once | INTRAVENOUS | Status: AC
  Administered 2019-05-25: 2 g via INTRAVENOUS
  Filled 2019-05-25: qty 50

## 2019-05-25 MED ORDER — DILTIAZEM HCL 100 MG IV SOLR
5.0000 mg/h | INTRAVENOUS | Status: DC
  Filled 2019-05-25 (×6): qty 100

## 2019-05-25 MED ORDER — ONDANSETRON HCL 4 MG/2ML IJ SOLN: 4.0000 mg | Freq: Four times a day (QID) | INTRAMUSCULAR | Status: DC | PRN

## 2019-05-25 MED ORDER — INSULIN ASPART 100 UNIT/ML ~~LOC~~ SOLN
0.0000 [IU] | Freq: Three times a day (TID) | SUBCUTANEOUS | Status: DC
  Administered 2019-05-25: 3 [IU] via SUBCUTANEOUS
  Administered 2019-05-25: 2 [IU] via SUBCUTANEOUS
  Administered 2019-05-25 – 2019-05-26 (×4): 3 [IU] via SUBCUTANEOUS
  Administered 2019-05-27: 5 [IU] via SUBCUTANEOUS
  Administered 2019-05-27: 2 [IU] via SUBCUTANEOUS
  Administered 2019-05-27 – 2019-05-28 (×3): 1 [IU] via SUBCUTANEOUS

## 2019-05-25 MED ORDER — ZOLPIDEM TARTRATE 5 MG PO TABS
5.0000 mg | ORAL_TABLET | Freq: Every evening | ORAL | Status: DC | PRN
  Administered 2019-05-25 – 2019-05-27 (×3): 5 mg via ORAL
  Filled 2019-05-25 (×3): qty 1

## 2019-05-25 MED ORDER — DILTIAZEM HCL 100 MG IV SOLR
INTRAVENOUS | Status: AC
  Administered 2019-05-25: 14:00:00
  Filled 2019-05-25: qty 100

## 2019-05-25 MED ORDER — ALLOPURINOL 100 MG PO TABS
100.0000 mg | ORAL_TABLET | Freq: Every day | ORAL | Status: DC
  Administered 2019-05-25 – 2019-05-27 (×3): 100 mg via ORAL
  Filled 2019-05-25 (×3): qty 1

## 2019-05-25 MED ORDER — ATORVASTATIN CALCIUM 40 MG PO TABS
40.0000 mg | ORAL_TABLET | Freq: Every day | ORAL | Status: DC
  Administered 2019-05-25 – 2019-05-27 (×3): 40 mg via ORAL
  Filled 2019-05-25 (×3): qty 1

## 2019-05-25 MED ORDER — SODIUM CHLORIDE 0.9% FLUSH
3.0000 mL | Freq: Two times a day (BID) | INTRAVENOUS | Status: DC
  Administered 2019-05-25 – 2019-05-26 (×4): 3 mL via INTRAVENOUS

## 2019-05-25 MED ORDER — DILTIAZEM HCL-DEXTROSE 100-5 MG/100ML-% IV SOLN (PREMIX): 5.0000 mg/h | INTRAVENOUS | Status: DC

## 2019-05-25 MED ORDER — ONDANSETRON HCL 4 MG PO TABS: 4.0000 mg | ORAL_TABLET | Freq: Four times a day (QID) | ORAL | Status: DC | PRN

## 2019-05-25 MED ORDER — SOTALOL HCL 120 MG PO TABS
120.0000 mg | ORAL_TABLET | Freq: Two times a day (BID) | ORAL | Status: DC
  Administered 2019-05-25 – 2019-05-27 (×6): 120 mg via ORAL
  Filled 2019-05-25 (×7): qty 1

## 2019-05-25 MED ORDER — RIVAROXABAN 20 MG PO TABS
20.0000 mg | ORAL_TABLET | Freq: Every day | ORAL | Status: DC
  Administered 2019-05-25 – 2019-05-27 (×3): 20 mg via ORAL
  Filled 2019-05-25 (×5): qty 1

## 2019-05-25 MED ORDER — INFLUENZA VAC A&B SA ADJ QUAD 0.5 ML IM PRSY
0.5000 mL | PREFILLED_SYRINGE | INTRAMUSCULAR | Status: AC
  Administered 2019-05-26: 0.5 mL via INTRAMUSCULAR
  Filled 2019-05-25: qty 0.5

## 2019-05-25 MED ORDER — HYDROCODONE-ACETAMINOPHEN 5-325 MG PO TABS: 1.0000 | ORAL_TABLET | ORAL | Status: DC | PRN

## 2019-05-25 MED ORDER — SODIUM CHLORIDE 0.9% FLUSH: 3.0000 mL | INTRAVENOUS | Status: DC | PRN

## 2019-05-25 MED ORDER — INSULIN ASPART 100 UNIT/ML ~~LOC~~ SOLN
0.0000 [IU] | Freq: Every day | SUBCUTANEOUS | Status: DC
  Administered 2019-05-25 – 2019-05-27 (×3): 2 [IU] via SUBCUTANEOUS

## 2019-05-25 MED ORDER — METHYLPREDNISOLONE SODIUM SUCC 40 MG IJ SOLR
40.0000 mg | Freq: Two times a day (BID) | INTRAMUSCULAR | Status: DC
  Administered 2019-05-25 – 2019-05-26 (×2): 40 mg via INTRAVENOUS
  Filled 2019-05-25 (×2): qty 1

## 2019-05-25 MED ORDER — LEVOTHYROXINE SODIUM 75 MCG PO TABS
125.0000 ug | ORAL_TABLET | Freq: Every day | ORAL | Status: DC
  Administered 2019-05-25 – 2019-05-28 (×4): 125 ug via ORAL
  Filled 2019-05-25 (×4): qty 1

## 2019-05-25 MED ORDER — SODIUM CHLORIDE 0.9% FLUSH
3.0000 mL | Freq: Two times a day (BID) | INTRAVENOUS | Status: DC
  Administered 2019-05-25 – 2019-05-26 (×3): 3 mL via INTRAVENOUS

## 2019-05-25 MED ORDER — SODIUM CHLORIDE 0.9 % IV SOLN: 250.0000 mL | INTRAVENOUS | Status: DC | PRN

## 2019-05-25 MED ORDER — LISINOPRIL 10 MG PO TABS
10.0000 mg | ORAL_TABLET | Freq: Every day | ORAL | Status: DC
  Administered 2019-05-25 – 2019-05-27 (×3): 10 mg via ORAL
  Filled 2019-05-25 (×3): qty 1

## 2019-05-25 NOTE — ED Notes (Signed)
No needs voiced at this time

## 2019-05-25 NOTE — H&P (Signed)
History and Physical    Melinda Mckinney [id number] DOB: March 23, 1956 DOA: 05/24/2019  PCP: Colon Branch, MD   Patient coming from: Home   Chief Complaint: Abdominal pain, diarrhea, SOB   HPI: Melinda Mckinney is a 63 y.o. female with medical history significant for coronary artery disease, fibromyalgia, atrial fibrillation on Xarelto, type 2 diabetes mellitus, chronic systolic CHF, hypothyroidism, and admission last month with COVID-19 infection, now returning to the emergency department for evaluation of abdominal pain, diarrhea, and shortness of breath.  Patient felt that her symptoms were completely resolving 2 weeks ago when she developed upper abdominal discomfort and diarrhea.  Over the past few days, she has had increasing upper abdominal pain and nonbloody diarrhea.  She has also become progressively dyspneic, initially just with exertion, but now becoming more constant.  She has not been vomiting.  She denies any leg swelling or tenderness.  ED Course: Upon arrival to the ED, patient is found to be afebrile, saturating 91% on room air, mildly tachypneic, tachycardic to [id number], and with systolic blood pressure 96.  EKG features atrial fibrillation with RVR, rate 148.  Chest x-ray is notable for widespread bilateral multifocal airspace disease.  CT of the abdomen and pelvis is negative for acute intra-abdominal findings.  Chemistry panel features a potassium of 3.3 and magnesium 1.4.  CBC notable for a normocytic anemia with hemoglobin 10.4.  COVID-19 testing is positive.  Blood cultures were collected and the patient was treated with a liter of normal saline, potassium, Decadron, vancomycin, Flagyl, aztreonam, 10 mg IV diltiazem, and was started on diltiazem infusion.  Rate is now controlled on diltiazem infusion.  Review of Systems:  All other systems reviewed and apart from HPI, are negative.  Past Medical History:  Diagnosis Date   Abnormal Pap smear of cervix    Anemia    Anxiety and  depression    Asthma    Atrial fibrillation ([place]) 07/2017   Borderline diabetes    CAD (coronary artery disease) ~2009   stent    Cervical cancer (HCC)    "stage 1; had cryotherapy"   Cervical dysplasia    Chronic lower back pain    DDD (degenerative disc disease), lumbar    Dr. Nelva Bush, recommends lumbar epidural steroid injections L5-S1 to the right   Depression    Fibromyalgia    GERD (gastroesophageal reflux disease)    Headache    "only when I'm short of breath"  (07/27/2017)   High cholesterol    History of hiatal hernia    History of kidney stones    Hypertension    Hypothyroidism    Insomnia [id number]   Lichen sclerosus    Vulvar area   Osteoporosis    Thyroid disease    Vitamin D deficiency     Past Surgical History:  Procedure Laterality Date   CARDIAC CATHETERIZATION  ~ 2008   CARDIOVERSION N/A 08/05/2017   Procedure: CARDIOVERSION;  Surgeon: Evans Lance, MD;  Location: [facility];  Service: Cardiovascular;  Laterality: N/A;   CARDIOVERSION N/A 08/24/2017   Procedure: CARDIOVERSION;  Surgeon: Sanda Klein, MD;  Location: [facility];  Service: Cardiovascular;  Laterality: N/A;   [place]; ~ 1978/1979; 1984   ESOPHAGOMYOTOMY  2009   GYNECOLOGIC CRYOSURGERY  X 2   "stage 1 cancer"   HERNIA REPAIR  X 6   "all in my stomach" (07/27/2017)   KNEE ARTHROSCOPY Right    NEPHRECTOMY Right 1991   in [place], due  to fibrosis and lithiasis   TUBAL LIGATION       reports that she quit smoking about 20 years ago. Her smoking use included cigarettes. She has a 56.00 pack-year smoking history. She has never used smokeless tobacco. She reports current alcohol use. She reports that she does not use drugs.  Allergies  Allergen Reactions   Penicillins Swelling and Rash    Has patient had a PCN reaction causing immediate rash, facial/tongue/throat swelling, SOB or lightheadedness with hypotension: No Has patient had a PCN  reaction causing severe rash involving mucus membranes or skin necrosis: No Has patient had a PCN reaction that required hospitalization: No Has patient had a PCN reaction occurring within the last 10 years: No If all of the above answers are "NO", then may proceed with Cephalosporin use.    Family History  Problem Relation Age of Onset   Breast cancer Other        aunt    CAD Brother    Lung cancer Mother        F and M   Diabetes Father        F and mother    CAD Father    Lung cancer Father    Stroke Sister    Colon cancer Neg Hx      Prior to Admission medications   Medication Sig Start Date End Date Taking? Authorizing Provider  allopurinol (ZYLOPRIM) 100 MG tablet Take 0.5 tablets (50 mg total) by mouth daily. Patient taking differently: Take 100 mg by mouth at bedtime.  12/28/18   Colon Branch, MD  atorvastatin (LIPITOR) 40 MG tablet Take 1 tablet (40 mg total) by mouth at bedtime. 10/31/18   Colon Branch, MD  Cholecalciferol (VITAMIN D3) 5000 UNITS CAPS Take 5,000 Units by mouth daily.     [provider]  cyclobenzaprine (FLEXERIL) 10 MG tablet Take 1 tablet (10 mg total) by mouth 2 (two) times daily as needed for muscle spasms. 10/01/18   Colon Branch, MD  diltiazem (CARDIZEM) 120 MG tablet TAKE 1/2 TABLET BY MOUTH TWICE DAILY Patient taking differently: Take 60 mg by mouth 2 (two) times daily.  03/29/19   Colon Branch, MD  DULoxetine (CYMBALTA) 60 MG capsule Take 1 capsule (60 mg total) by mouth daily. 01/28/19   Colon Branch, MD  furosemide (LASIX) 20 MG tablet Take 1 tablet (20 mg total) by mouth daily. 12/17/18   Colon Branch, MD  levothyroxine (SYNTHROID, LEVOTHROID) 125 MCG tablet TAKE ONE TABLET BY MOUTH DAILY BEFORE BREAKFAST Patient taking differently: Take 125 mcg by mouth daily before breakfast.  09/12/18   Philemon Kingdom, MD  lisinopril (ZESTRIL) 10 MG tablet Take 1 tablet (10 mg total) by mouth daily. 01/23/19   Colon Branch, MD  metFORMIN (GLUCOPHAGE)  500 MG tablet Take 1 tablet (500 mg total) by mouth 2 (two) times daily with a meal. 05/21/19   Colon Branch, MD  omeprazole (PRILOSEC) 20 MG capsule Take 1 capsule (20 mg total) by mouth daily. Patient taking differently: Take 20 mg by mouth 2 (two) times daily before a meal.  08/20/18   Colon Branch, MD  PRESCRIPTION MEDICATION 4 g See admin instructions. Testosterone 2 % cream  APPLY FOUR CLICKS (ONE GRAM) TO INNER THIGH DAILY. ROTATE    [provider]  raloxifene (EVISTA) 60 MG tablet Take 1 tablet (60 mg total) by mouth daily. Patient taking differently: Take 60 mg by mouth at bedtime.  09/08/16   Terrance Mass, MD  rivaroxaban (XARELTO) 20 MG TABS tablet Take 1 tablet (20 mg total) by mouth daily with supper. 11/05/18   Colon Branch, MD  SOTALOL AF 120 MG TABS TAKE ONE TABLET BY MOUTH EVERY 12 HOURS Patient taking differently: Take 120 mg by mouth every 12 (twelve) hours.  10/02/18   Allred, Jeneen Rinks, MD  zolpidem (AMBIEN) 5 MG tablet Take 1 tablet (5 mg total) by mouth at bedtime as needed. for sleep Patient taking differently: Take 5 mg by mouth at bedtime as needed for sleep. for sleep 01/11/19   Colon Branch, MD    Physical Exam: Vitals:   05/24/19 2200 05/25/19 0052 05/25/19 0147 05/25/19 0200  BP: 112/63 102/60 99/73 (!) 96/59  Pulse: (!) 29 62 71 (!) 26  Resp: (!) 24 (!) 23 (!) 24 17  Temp:   98.5 F (36.9 C) 97.9 F (36.6 C)  TempSrc:   Oral Oral  SpO2: (!) 89% 94% 93% 91%  Height:        Constitutional: NAD, calm  Eyes: PERTLA, lids and conjunctivae normal ENMT: Mucous membranes are moist. Posterior pharynx clear of any exudate or lesions.   Neck: normal, supple, no masses, no thyromegaly Respiratory: no wheezing. Scattered rhonchi. No accessory muscle use.  Cardiovascular: Rate ~80 and irregularly irregular. No extremity edema. Abdomen: No distension, soft, no rebound pain or guarding. Bowel sounds active.  Musculoskeletal: no clubbing / cyanosis. No joint  deformity upper and lower extremities.   Skin: no significant rashes, lesions, ulcers. Warm, dry, well-perfused. Neurologic: CN 2-12 grossly intact. Sensation intact. Moving all extremities.  Psychiatric: Alert and oriented x 3. Pleasant, cooperative.    Labs on Admission: I have personally reviewed following labs and imaging studies  CBC: Recent Labs  Lab 05/24/19 2028  WBC 6.3  NEUTROABS 4.4  HGB 10.4*  HCT 34.4*  MCV 93.2  PLT 0000000   Basic Metabolic Panel: Recent Labs  Lab 05/24/19 2028 05/24/19 2030  NA 138  --   K 3.3*  --   CL 105  --   CO2 20*  --   GLUCOSE 245*  --   BUN 9  --   CREATININE 0.83  --   CALCIUM 8.7*  --   MG  --  1.4*   GFR: CrCl cannot be calculated (Unknown ideal weight.). Liver Function Tests: Recent Labs  Lab 05/24/19 2028  AST 18  ALT 12  ALKPHOS 92  BILITOT 0.4  PROT 6.5  ALBUMIN 3.0*   Recent Labs  Lab 05/24/19 2028  LIPASE 21   No results for input(s): AMMONIA in the last 168 hours. Coagulation Profile: No results for input(s): INR, PROTIME in the last 168 hours. Cardiac Enzymes: No results for input(s): CKTOTAL, CKMB, CKMBINDEX, TROPONINI in the last 168 hours. BNP (last 3 results) No results for input(s): PROBNP in the last 8760 hours. HbA1C: No results for input(s): HGBA1C in the last 72 hours. CBG: No results for input(s): GLUCAP in the last 168 hours. Lipid Profile: No results for input(s): CHOL, HDL, LDLCALC, TRIG, CHOLHDL, LDLDIRECT in the last 72 hours. Thyroid Function Tests: No results for input(s): TSH, T4TOTAL, FREET4, T3FREE, THYROIDAB in the last 72 hours. Anemia Panel: No results for input(s): VITAMINB12, FOLATE, FERRITIN, TIBC, IRON, RETICCTPCT in the last 72 hours. Urine analysis:    Component Value Date/Time   COLORURINE YELLOW 07/24/2014 0848   APPEARANCEUR CLEAR 07/24/2014 0848   LABSPEC >=1.030 (A) 07/24/2014 0848  PHURINE 5.0 07/24/2014 0848   GLUCOSEU NEGATIVE 07/24/2014 0848   HGBUR  NEGATIVE 07/24/2014 0848   BILIRUBINUR NEGATIVE 07/24/2014 0848   KETONESUR NEGATIVE 07/24/2014 0848   PROTEINUR 30 (A) 07/30/2012 1639   UROBILINOGEN 0.2 07/24/2014 0848   NITRITE NEGATIVE 07/24/2014 0848   LEUKOCYTESUR TRACE (A) 07/24/2014 0848   Sepsis Labs: @LABRCNTIP (procalcitonin:4,lacticidven:4) ) Recent Results (from the past 240 hour(s))  SARS Coronavirus 2 by RT PCR (hospital order, performed in [facility] hospital lab) Nasopharyngeal Nasopharyngeal Swab     Status: Abnormal   Collection Time: 05/24/19  8:28 PM   Specimen: Nasopharyngeal Swab  Result Value Ref Range Status   SARS Coronavirus 2 POSITIVE (A) NEGATIVE Final    Comment: RESULT CALLED TO, READ BACK BY AND VERIFIED WITH: NEAL,K AT 2134 ON [id number] BY CHERESNOWSKY,T (NOTE) If result is NEGATIVE SARS-CoV-2 target nucleic acids are NOT DETECTED. The SARS-CoV-2 RNA is generally detectable in upper and lower  respiratory specimens during the acute phase of infection. The lowest  concentration of SARS-CoV-2 viral copies this assay can detect is 250  copies / mL. A negative result does not preclude SARS-CoV-2 infection  and should not be used as the sole basis for treatment or other  patient management decisions.  A negative result may occur with  improper specimen collection / handling, submission of specimen other  than nasopharyngeal swab, presence of viral mutation(s) within the  areas targeted by this assay, and inadequate number of viral copies  (<250 copies / mL). A negative result must be combined with clinical  observations, patient history, and epidemiological information. If result is POSITIVE SARS-CoV-2 target nucleic acids are DETEC TED. The SARS-CoV-2 RNA is generally detectable in upper and lower  respiratory specimens during the acute phase of infection.  Positive  results are indicative of active infection with SARS-CoV-2.  Clinical  correlation with patient history and other diagnostic information  is  necessary to determine patient infection status.  Positive results do  not rule out bacterial infection or co-infection with other viruses. If result is PRESUMPTIVE POSTIVE SARS-CoV-2 nucleic acids MAY BE PRESENT.   A presumptive positive result was obtained on the submitted specimen  and confirmed on repeat testing.  While 2019 novel coronavirus  (SARS-CoV-2) nucleic acids may be present in the submitted sample  additional confirmatory testing may be necessary for epidemiological  and / or clinical management purposes  to differentiate between  SARS-CoV-2 and other Sarbecovirus currently known to infect humans.  If clinically indicated additional testing with an alternate test  methodology (LAB7 [id number]) is advised. The SARS-CoV-2 RNA is generally  detectable in upper and lower respiratory specimens during the acute  phase of infection. The expected result is Negative. Fact Sheet for Patients:  StrictlyIdeas.no Fact Sheet for Healthcare Providers: BankingDealers.co.za This test is not yet approved or cleared by the [place] FDA and has been authorized for detection and/or diagnosis of SARS-CoV-2 by FDA under an Emergency Use Authorization (EUA).  This EUA will remain in effect (meaning this test can be used) for the duration of the COVID-19 declaration under Section 564(b)(1) of the Act, 21 U.S.C. section 360bbb-3(b)(1), unless the authorization is terminated or revoked sooner. Performed at [facility], [address] [place], [place] [address]      Radiological Exams on Admission: Ct Abdomen Pelvis W Contrast  Result Date: 05/24/2019 CLINICAL DATA:  An acute generalized abdominal pain diarrhea EXAM: CT ABDOMEN AND PELVIS WITH CONTRAST TECHNIQUE: Multidetector CT imaging of  the abdomen and pelvis was performed using the standard protocol following bolus administration of intravenous contrast. CONTRAST:  41mL OMNIPAQUE  IOHEXOL 300 MG/ML  SOLN COMPARISON:  CT 07/30/2012 FINDINGS: Lower chest: Multifocal airspace consolidation and surrounding ground-glass opacity in both lungs with bilateral effusions. Bandlike opacities likely reflect associated atelectasis and/or scarring. Thickening and fluid-filled distal airways are noted. Cardiomegaly with biatrial enlargement. No pericardial effusion. Atherosclerotic calcification of the coronary arteries. Hepatobiliary: No focal liver abnormality is seen. No gallstones, gallbladder wall thickening, or biliary dilatation. Pancreas: Unremarkable. No pancreatic ductal dilatation or surrounding inflammatory changes. Spleen: Few punctate calcifications present within the spleen. Spleen otherwise unremarkable. Adrenals/Urinary Tract: Normal adrenal glands. Surgical absence of the right kidney. No abnormal soft tissue in the nephrectomy bed. Mild lobulation of the left kidney without concerning renal mass, calculus or hydronephrosis. No urinary tract dilatation. Urinary bladder is unremarkable. Stomach/Bowel: Distal esophagus, stomach and duodenal sweep are unremarkable. No small bowel wall thickening or dilatation. Interposition of several small bowel loops and portion of the colon anterior to the liver. No resulting obstruction. A normal appendix is visualized. No colonic dilatation or wall thickening. Vascular/Lymphatic: Atherosclerotic plaque within the normal caliber aorta. No suspicious or enlarged lymph nodes in the included lymphatic chains. Reproductive: Normal appearance of the uterus and adnexal structures. Postsurgical changes from prior Caesarean section. Other: Postsurgical changes of the low anterior abdomen, likely related prior Caesarean section within enlarging incisional hernia involving the right lateral aspect of the incisional line. Herniated portion of the fat now measures approximately 13.4 x 3.3 cm in size. Hernia defect measures approximately 3.5 by 1.4 cm transverse by  craniocaudal. No abnormal stranding within the herniated fat. No bowel containing hernia. No free fluid or free air within the abdomen or pelvis. Musculoskeletal: Mild degenerative changes in the spine most pronounced at L5-S1. No acute osseous abnormality or suspicious osseous lesion. IMPRESSION: 1. Multifocal airspace consolidation and surrounding ground-glass opacity in both lungs with bilateral effusions, concerning for multifocal pneumonia including possible atypical viral etiologies such as COVID-19. 2. No acute intra-abdominal process. 3. Postsurgical changes from prior Caesarean section within enlarging fat containing incisional hernia involving the right lateral aspect of the incisional line. No abnormal stranding within the herniated fat. 4. Aortic Atherosclerosis (ICD10-I70.0). Electronically Signed   By: Lovena Le M.D.   On: 05/24/2019 22:41   Dg Chest Portable 1 View  Result Date: 05/24/2019 CLINICAL DATA:  Shortness of breath, abdominal pain and diarrhea EXAM: PORTABLE CHEST 1 VIEW COMPARISON:  Radiograph 04/30/2019. Same-day CT abdomen pelvis. FINDINGS: Cardiomegaly, as seen on CT. Increased from comparison studies. Widespread bilateral multifocal airspace opacities throughout both lungs with associated volume loss. Partial obscuration of the hemidiaphragm suggest the pleural effusions also seen on CT. No pneumothorax. No acute osseous or soft tissue abnormality. IMPRESSION: 1. Widespread bilateral multifocal airspace opacities with associated volume loss, consistent with multifocal pneumonia including potential atypical viral etiologies such as COVID-19. 2. New cardiomegaly. Electronically Signed   By: Lovena Le M.D.   On: 05/24/2019 22:44    EKG: Independently reviewed. Atrial fibrillation with RVR, rate 148.   Assessment/Plan   1. COVID-19, Pneumonia  - Presents with abdominal pain, diarrhea, and SOB, found to be positive for COVID-19 with widespread bilateral airspace disease on  CXR  - Blood cultures were collected in ED and she was started on broad-spectrum antibiotics  - Check/trend inflammatory markers, hold further abx unless procalcitonin elevated, continue Decadron, supplemental O2, supportive care    2. Atrial fibrillation  with RVR  - Presents with abdominal pain, diarrhea, and SOB, and is found to be atrial fib with rate as high as 150  - Possibly precipitated by her pneumonia and/or electrolyte derangements  - Rate is controlled on diltiazem infusion at time of admission, will convert to PO as tolerated, continue Xarelto and sotalol    3. Chronic systolic CHF  - Appears compensated   - EF 45-50% in 2018 with mild AI and moderate MR  - She received a liter of NS in ED  - Hold Lasix initially, continue lisinopril, follow I/O's    4. Hypokalemia; hypomagnesemia  - Serum potassium is 3.3 and magnesium 1.4 in ED  - Likely secondary to GI-losses - Both replaced, repeat chem panel in am    5. Type II DM  - A1c was 6.1% in September 2020  - She is being treated with systemic steroid as above  - Managed with metformin at home, held on admission  - Check CBG's and use a low-intensity SSI with Novolog while in hospital    6. Hypothyroidism  - Check TSH given AF with RVR  - Continue Synthroid    7. Diarrhea  - CT abd neg for acute process  - Abdominal exam benign   - Likely secondary to COVID-19 infection  - Monitor fluid-status and electrolytes, continue supportive care     8. Fibromyalgia  - Continue Flexeril prn     PPE: CAPR, gown, gloves  DVT prophylaxis: Xarelto  Code Status: Full  Family Communication: Discussed with patient  Consults called: None  Admission status: Inpatient     Vianne Bulls, MD [facility] Pager [phone]  If 7PM-7AM, please contact night-coverage [email or web address] Password [email or web address]  05/25/2019, 3:30 AM

## 2019-05-25 NOTE — Progress Notes (Signed)
PROGRESS NOTE                                                                                                                                                                                                             Patient Demographics:    Melinda Mckinney, is a 63 y.o. female, DOB - [birth year], KA:[id number]  Outpatient Primary MD for the patient is Colon Branch, MD   Admit date - 05/24/2019   LOS - 0  Chief Complaint  Patient presents with   Abdominal Pain       Brief Narrative: Patient is a 63 y.o. female with PMHx of s/p PCI, atrial fibrillation, chronic diastolic heart failure, DM, HTN, stage I cervical CA s/p cryotherapy-who was hospitalized at this facility from 9/15-9/17 after presenting with GI symptoms and fever, she was found to have COVID-19.  She was managed with supportive care and subsequently discharged home on Decadron.  For 6 days prior to this current hospital admission-patient developed nausea, vomiting, upper abdominal pain and exertional dyspnea.  Was found to have A. fib with RVR, chest x-ray was positive for multifocal infiltrates-CT of the abdomen was negative for acute abnormalities-patient was started on Cardizem infusion-and subsequently admitted to the hospitalist service.   Subjective:    Melinda Mckinney today feels better-nausea, vomiting and diarrhea have resolved.  She is not on oxygen-looks very comfortable.   Assessment  & Plan :   PNA with gastroenteritis-COVID-19 initially positive on 9/15: Has new infiltrates on imaging studies (not present at recent hospitalization)-procalcitonin is negative-CRP is elevated-very unusual to have COVID-19 pneumonitis this late (>3weeks since initial diagnoses).  Could have ?other viral pneumonia-hence will check a resp virus panel. Check Legionella urine antigen (diarrhea w lung infiltrates). In the meantime-will start IV steroids.Continues supportive  care  Fever: afebrile  O2 requirements: On RA  COVID-19 Labs: Recent Labs    05/25/19 0530  DDIMER 0.55*  FERRITIN 44  CRP 11.1*    Lab Results  Component Value Date   SARSCOV2NAA POSITIVE (A) 05/24/2019   SARSCOV2NAA POSITIVE (A) 04/30/2019     COVID-19 Medications: Steroids:10/9>> Remdesivir:not indicated-as >3weeks since initial diagnoses Actemra:Not indicated Convalescent Plasma:N/A Research Studies:N/A  Other medications: Diuretics:Euvolemic- Lasix x 1 to maintain negative balance-BNP mildly elevated Antibiotics:Not needed as no evidence of bacterial infection  Prone/Incentive Spirometry: encouraged incentive spirometry use  3-4/hour.  DVT Prophylaxis  :  Xarelto  A. fib with RVR: Rate controlled-start oral Cardizem-attempt to titrate of Cardizem infusion.  Continue sotalol-remains on Xarelto.  Chronic systolic heart failure (EF 45-50 on 07/27/2017): Volume status stable-1 dose of Lasix-see above.  HTN: Controlled-continue lisinopril  DM-2: CBG stable-continue SSI-Metformin on hold  CBG (last 3)  Recent Labs    05/25/19 0755 05/25/19 1147  GLUCAP 210* 195*   Hypothyroidism: Continue Synthroid  History of fibromyalgia: Continue Flexeril as needed  Obesity: Estimated body mass index is 42.92 kg/m as calculated from the following:   Height as of this encounter: 5\' 5"  (1.651 m).   Weight as of this encounter: 117 kg.   ABG: No results found for: PHART, PCO2ART, PO2ART, HCO3, TCO2, ACIDBASEDEF, O2SAT  Vent Settings: N/A  Condition - Stable  Family Communication  :  None at bedside  Code Status :  Full Code  Diet :  Diet Order            Diet heart healthy/carb modified Room service appropriate? Yes; Fluid consistency: Thin  Diet effective now               Disposition Plan  :  Remain hospitalized  Barriers to discharge: Elevated inflammatory markers-pneumonia-on IV steroids  Consults  :  None  Procedures  :  None  GI  prophylaxis: None  Antibiotics  :    Anti-infectives (From admission, onward)   Start     Dose/Rate Route Frequency Ordered Stop   05/25/19 2130  vancomycin (VANCOCIN) 1,500 mg in sodium chloride 0.9 % 500 mL IVPB  Status:  Discontinued     1,500 mg 250 mL/hr over 120 Minutes Intravenous Every 24 hours 05/24/19 2113 05/25/19 0330   05/25/19 0530  aztreonam (AZACTAM) injection 2 g  Status:  Discontinued     2 g Intramuscular Every 8 hours 05/24/19 2115 05/25/19 0330   05/24/19 2230  vancomycin (VANCOCIN) IVPB 1000 mg/200 mL premix  Status:  Discontinued     1,000 mg 200 mL/hr over 60 Minutes Intravenous  Once 05/24/19 2105 05/25/19 0330   05/24/19 2121  vancomycin (VANCOCIN) 1000 MG powder    Note to Pharmacy: Woodward Ku   : cabinet override      05/24/19 2121 05/25/19 0929   05/24/19 2121  vancomycin (VANCOCIN) 500 MG powder    Note to Pharmacy: Woodward Ku   : cabinet override      05/24/19 2121 05/25/19 0929   05/24/19 2121  aztreonam (AZACTAM) 1 g injection    Note to Pharmacy: Woodward Ku   : cabinet override      05/24/19 2121 05/25/19 0929   05/24/19 2115  aztreonam (AZACTAM) 2 g in sodium chloride 0.9 % 100 mL IVPB     2 g 200 mL/hr over 30 Minutes Intravenous  Once 05/24/19 2101 05/24/19 2204   05/24/19 2115  metroNIDAZOLE (FLAGYL) IVPB 500 mg     500 mg 100 mL/hr over 60 Minutes Intravenous  Once 05/24/19 2101 05/25/19 0037   05/24/19 2115  vancomycin (VANCOCIN) IVPB 1000 mg/200 mL premix  Status:  Discontinued     1,000 mg 200 mL/hr over 60 Minutes Intravenous  Once 05/24/19 2101 05/24/19 2103   05/24/19 2115  vancomycin (VANCOCIN) IVPB 1000 mg/200 mL premix     1,000 mg 200 mL/hr over 60 Minutes Intravenous  Once 05/24/19 2105 05/25/19 0148      Inpatient Medications  Scheduled Meds:  allopurinol  100 mg Oral  QHS   atorvastatin  40 mg Oral q1800   dexamethasone (DECADRON) injection  6 mg Intravenous Q24H   diltiazem       diltiazem  30 mg Oral  Q6H   DULoxetine  60 mg Oral Daily   [START ON 05/26/2019] influenza vaccine adjuvanted  0.5 mL Intramuscular Tomorrow-1000   insulin aspart  0-5 Units Subcutaneous QHS   insulin aspart  0-9 Units Subcutaneous TID WC   levothyroxine  125 mcg Oral QAC breakfast   lisinopril  10 mg Oral Daily   pantoprazole  40 mg Oral Daily   [START ON 05/26/2019] pneumococcal 23 valent vaccine  0.5 mL Intramuscular Tomorrow-1000   rivaroxaban  20 mg Oral Q supper   sodium chloride flush  3 mL Intravenous Q12H   sodium chloride flush  3 mL Intravenous Q12H   sotalol  120 mg Oral Q12H   Continuous Infusions:  sodium chloride     diltiazem (CARDIZEM)100 mg/100 ml infusion (1 mg/ml) Stopped (05/25/19 1222)   PRN Meds:.sodium chloride, acetaminophen, cyclobenzaprine, HYDROcodone-acetaminophen, morphine injection, ondansetron **OR** ondansetron (ZOFRAN) IV, sodium chloride flush, zolpidem   Time Spent in minutes  25  See all Orders from today for further details   Oren Binet M.D on 05/25/2019 at 12:29 PM  To page go to [email or web address] - use universal password  [facility] -  Office  [phone]    Objective:   Vitals:   05/25/19 0750 05/25/19 0800 05/25/19 0900 05/25/19 1000  BP:  103/77 (!) 109/91 (!) 118/94  Pulse: 98 84 (!) 45 (!) 54  Resp:  20 16 19   Temp: 97.8 F (36.6 C)     TempSrc: Oral     SpO2:  92% 92% 94%  Weight:      Height:        Wt Readings from Last 3 Encounters:  05/25/19 117 kg  05/02/19 111.7 kg  01/30/19 112.6 kg     Intake/Output Summary (Last 24 hours) at 05/25/2019 1229 Last data filed at 05/25/2019 0400 Gross per 24 hour  Intake 2619.17 ml  Output --  Net 2619.17 ml     Physical Exam Gen Exam:Alert awake-not in any distress HEENT:atraumatic, normocephalic Chest: B/L clear to auscultation anteriorly CVS:S1S2 regular Abdomen:soft non tender, non distended Extremities:no edema Neurology: Non focal Skin: no rash   Data  Review:    CBC Recent Labs  Lab 05/24/19 2028 05/25/19 0530  WBC 6.3 7.7  HGB 10.4* 10.1*  HCT 34.4* 33.1*  PLT 193 208  MCV 93.2 94.0  MCH 28.2 28.7  MCHC 30.2 30.5  RDW 15.1 15.3  LYMPHSABS 1.3 0.6*  MONOABS 0.3 0.1  EOSABS 0.3 0.0  BASOSABS 0.0 0.0    Chemistries  Recent Labs  Lab 05/24/19 2028 05/24/19 2030 05/25/19 0530  NA 138  --  138  K 3.3*  --  4.4  CL 105  --  108  CO2 20*  --  20*  GLUCOSE 245*  --  214*  BUN 9  --  9  CREATININE 0.83  --  0.79  CALCIUM 8.7*  --  8.4*  MG  --  1.4*  --   AST 18  --  13*  ALT 12  --  12  ALKPHOS 92  --  96  BILITOT 0.4  --  0.6   ------------------------------------------------------------------------------------------------------------------ No results for input(s): CHOL, HDL, LDLCALC, TRIG, CHOLHDL, LDLDIRECT in the last 72 hours.  Lab Results  Component Value Date   HGBA1C 6.1 (H)  05/01/2019   ------------------------------------------------------------------------------------------------------------------ Recent Labs    05/25/19 0530  TSH 1.627   ------------------------------------------------------------------------------------------------------------------ Recent Labs    05/25/19 0530  FERRITIN 44    Coagulation profile No results for input(s): INR, PROTIME in the last 168 hours.  Recent Labs    05/25/19 0530  DDIMER 0.55*    Cardiac Enzymes No results for input(s): CKMB, TROPONINI, MYOGLOBIN in the last 168 hours.  Invalid input(s): CK ------------------------------------------------------------------------------------------------------------------    Component Value Date/Time   BNP 205.9 (H) 05/25/2019 0530    Micro Results Recent Results (from the past 240 hour(s))  SARS Coronavirus 2 by RT PCR (hospital order, performed in [facility] hospital lab) Nasopharyngeal Nasopharyngeal Swab     Status: Abnormal   Collection Time: 05/24/19  8:28 PM   Specimen: Nasopharyngeal Swab   Result Value Ref Range Status   SARS Coronavirus 2 POSITIVE (A) NEGATIVE Final    Comment: RESULT CALLED TO, READ BACK BY AND VERIFIED WITH: NEAL,K AT 2134 ON [id number] BY CHERESNOWSKY,T (NOTE) If result is NEGATIVE SARS-CoV-2 target nucleic acids are NOT DETECTED. The SARS-CoV-2 RNA is generally detectable in upper and lower  respiratory specimens during the acute phase of infection. The lowest  concentration of SARS-CoV-2 viral copies this assay can detect is 250  copies / mL. A negative result does not preclude SARS-CoV-2 infection  and should not be used as the sole basis for treatment or other  patient management decisions.  A negative result may occur with  improper specimen collection / handling, submission of specimen other  than nasopharyngeal swab, presence of viral mutation(s) within the  areas targeted by this assay, and inadequate number of viral copies  (<250 copies / mL). A negative result must be combined with clinical  observations, patient history, and epidemiological information. If result is POSITIVE SARS-CoV-2 target nucleic acids are DETEC TED. The SARS-CoV-2 RNA is generally detectable in upper and lower  respiratory specimens during the acute phase of infection.  Positive  results are indicative of active infection with SARS-CoV-2.  Clinical  correlation with patient history and other diagnostic information is  necessary to determine patient infection status.  Positive results do  not rule out bacterial infection or co-infection with other viruses. If result is PRESUMPTIVE POSTIVE SARS-CoV-2 nucleic acids MAY BE PRESENT.   A presumptive positive result was obtained on the submitted specimen  and confirmed on repeat testing.  While 2019 novel coronavirus  (SARS-CoV-2) nucleic acids may be present in the submitted sample  additional confirmatory testing may be necessary for epidemiological  and / or clinical management purposes  to differentiate between   SARS-CoV-2 and other Sarbecovirus currently known to infect humans.  If clinically indicated additional testing with an alternate test  methodology (LAB7 [id number]) is advised. The SARS-CoV-2 RNA is generally  detectable in upper and lower respiratory specimens during the acute  phase of infection. The expected result is Negative. Fact Sheet for Patients:  StrictlyIdeas.no Fact Sheet for Healthcare Providers: BankingDealers.co.za This test is not yet approved or cleared by the [place] FDA and has been authorized for detection and/or diagnosis of SARS-CoV-2 by FDA under an Emergency Use Authorization (EUA).  This EUA will remain in effect (meaning this test can be used) for the duration of the COVID-19 declaration under Section 564(b)(1) of the Act, 21 U.S.C. section 360bbb-3(b)(1), unless the authorization is terminated or revoked sooner. Performed at [facility], [address], [place], [place] [address]     Radiology  Reports Ct Abdomen Pelvis W Contrast  Result Date: 05/24/2019 CLINICAL DATA:  An acute generalized abdominal pain diarrhea EXAM: CT ABDOMEN AND PELVIS WITH CONTRAST TECHNIQUE: Multidetector CT imaging of the abdomen and pelvis was performed using the standard protocol following bolus administration of intravenous contrast. CONTRAST:  19mL OMNIPAQUE IOHEXOL 300 MG/ML  SOLN COMPARISON:  CT 07/30/2012 FINDINGS: Lower chest: Multifocal airspace consolidation and surrounding ground-glass opacity in both lungs with bilateral effusions. Bandlike opacities likely reflect associated atelectasis and/or scarring. Thickening and fluid-filled distal airways are noted. Cardiomegaly with biatrial enlargement. No pericardial effusion. Atherosclerotic calcification of the coronary arteries. Hepatobiliary: No focal liver abnormality is seen. No gallstones, gallbladder wall thickening, or biliary dilatation. Pancreas: Unremarkable. No  pancreatic ductal dilatation or surrounding inflammatory changes. Spleen: Few punctate calcifications present within the spleen. Spleen otherwise unremarkable. Adrenals/Urinary Tract: Normal adrenal glands. Surgical absence of the right kidney. No abnormal soft tissue in the nephrectomy bed. Mild lobulation of the left kidney without concerning renal mass, calculus or hydronephrosis. No urinary tract dilatation. Urinary bladder is unremarkable. Stomach/Bowel: Distal esophagus, stomach and duodenal sweep are unremarkable. No small bowel wall thickening or dilatation. Interposition of several small bowel loops and portion of the colon anterior to the liver. No resulting obstruction. A normal appendix is visualized. No colonic dilatation or wall thickening. Vascular/Lymphatic: Atherosclerotic plaque within the normal caliber aorta. No suspicious or enlarged lymph nodes in the included lymphatic chains. Reproductive: Normal appearance of the uterus and adnexal structures. Postsurgical changes from prior Caesarean section. Other: Postsurgical changes of the low anterior abdomen, likely related prior Caesarean section within enlarging incisional hernia involving the right lateral aspect of the incisional line. Herniated portion of the fat now measures approximately 13.4 x 3.3 cm in size. Hernia defect measures approximately 3.5 by 1.4 cm transverse by craniocaudal. No abnormal stranding within the herniated fat. No bowel containing hernia. No free fluid or free air within the abdomen or pelvis. Musculoskeletal: Mild degenerative changes in the spine most pronounced at L5-S1. No acute osseous abnormality or suspicious osseous lesion. IMPRESSION: 1. Multifocal airspace consolidation and surrounding ground-glass opacity in both lungs with bilateral effusions, concerning for multifocal pneumonia including possible atypical viral etiologies such as COVID-19. 2. No acute intra-abdominal process. 3. Postsurgical changes from  prior Caesarean section within enlarging fat containing incisional hernia involving the right lateral aspect of the incisional line. No abnormal stranding within the herniated fat. 4. Aortic Atherosclerosis (ICD10-I70.0). Electronically Signed   By: Lovena Le M.D.   On: 05/24/2019 22:41   Dg Chest Portable 1 View  Result Date: 05/24/2019 CLINICAL DATA:  Shortness of breath, abdominal pain and diarrhea EXAM: PORTABLE CHEST 1 VIEW COMPARISON:  Radiograph 04/30/2019. Same-day CT abdomen pelvis. FINDINGS: Cardiomegaly, as seen on CT. Increased from comparison studies. Widespread bilateral multifocal airspace opacities throughout both lungs with associated volume loss. Partial obscuration of the hemidiaphragm suggest the pleural effusions also seen on CT. No pneumothorax. No acute osseous or soft tissue abnormality. IMPRESSION: 1. Widespread bilateral multifocal airspace opacities with associated volume loss, consistent with multifocal pneumonia including potential atypical viral etiologies such as COVID-19. 2. New cardiomegaly. Electronically Signed   By: Lovena Le M.D.   On: 05/24/2019 22:44   Dg Chest Port 1 View  Result Date: 04/30/2019 CLINICAL DATA:  Fever and body ache EXAM: PORTABLE CHEST 1 VIEW COMPARISON:  07/27/2017 FINDINGS: Mild cardiomegaly. Similar appearance of diffuse increased interstitial opacity, therefore felt chronic. No acute focal airspace disease or effusion. No pneumothorax. IMPRESSION: No  active disease. Stable cardiomegaly and diffuse interstitial prominence. Electronically Signed   By: Donavan Foil M.D.   On: 04/30/2019 17:49

## 2019-05-25 NOTE — Progress Notes (Addendum)
0230- IVP administered for uncontrolled A Fib.  Vitals signs and HR much improved.  See chart.  Spoke with Pilar Plate, patient's son.  Updated him on patient's status.  Answered all his questions.  Son was concerned about patient's stomach issues.  Stated that her stomach issues was why she was brought to the hospital to begin with and wanted to make sure she was being treated for it.  He was concerned because patient was just here being treated for COVID a few weeks ago.  Addressed all concerns with the son.

## 2019-05-25 NOTE — ED Notes (Signed)
Assumed care of pt at this time.

## 2019-05-26 LAB — RESPIRATORY PANEL BY PCR

## 2019-05-26 LAB — CBC WITH DIFFERENTIAL/PLATELET
Abs Immature Granulocytes: 0.06 10*3/uL (ref 0.00–0.07)
Basophils Absolute: 0 10*3/uL (ref 0.0–0.1)
Basophils Relative: 0 %
Eosinophils Absolute: 0 10*3/uL (ref 0.0–0.5)
Eosinophils Relative: 0 %
HCT: 33.3 % — ABNORMAL LOW (ref 36.0–46.0)
Hemoglobin: 9.8 g/dL — ABNORMAL LOW (ref 12.0–15.0)
Immature Granulocytes: 1 %
Lymphocytes Relative: 7 %
Lymphs Abs: 0.7 10*3/uL (ref 0.7–4.0)
MCH: 28.2 pg (ref 26.0–34.0)
MCHC: 29.4 g/dL — ABNORMAL LOW (ref 30.0–36.0)
MCV: 95.7 fL (ref 80.0–100.0)
Monocytes Absolute: 0.2 10*3/uL (ref 0.1–1.0)
Monocytes Relative: 2 %
Neutro Abs: 9.1 10*3/uL — ABNORMAL HIGH (ref 1.7–7.7)
Neutrophils Relative %: 90 %
Platelets: 263 10*3/uL (ref 150–400)
RBC: 3.48 MIL/uL — ABNORMAL LOW (ref 3.87–5.11)
RDW: 15.2 % (ref 11.5–15.5)
WBC: 10.1 10*3/uL (ref 4.0–10.5)
nRBC: 0.2 % (ref 0.0–0.2)

## 2019-05-26 LAB — GLUCOSE, CAPILLARY
Glucose-Capillary: 217 mg/dL — ABNORMAL HIGH (ref 70–99)
Glucose-Capillary: 210 mg/dL — ABNORMAL HIGH (ref 70–99)
Glucose-Capillary: 220 mg/dL — ABNORMAL HIGH (ref 70–99)
Glucose-Capillary: 222 mg/dL — ABNORMAL HIGH (ref 70–99)

## 2019-05-26 LAB — COMPREHENSIVE METABOLIC PANEL
ALT: 14 U/L (ref 0–44)
AST: 18 U/L (ref 15–41)
Albumin: 3.2 g/dL — ABNORMAL LOW (ref 3.5–5.0)
Alkaline Phosphatase: 93 U/L (ref 38–126)
Anion gap: 11 (ref 5–15)
BUN: 22 mg/dL (ref 8–23)
CO2: 21 mmol/L — ABNORMAL LOW (ref 22–32)
Calcium: 9.2 mg/dL (ref 8.9–10.3)
Chloride: 106 mmol/L (ref 98–111)
Creatinine, Ser: 0.98 mg/dL (ref 0.44–1.00)
GFR calc Af Amer: >60 mL/min (ref >60)
GFR calc non Af Amer: >60 mL/min (ref >60)
Glucose, Bld: 212 mg/dL — ABNORMAL HIGH (ref 70–99)
Potassium: 4.9 mmol/L (ref 3.5–5.1)
Sodium: 138 mmol/L (ref 135–145)
Total Bilirubin: 0.4 mg/dL (ref 0.3–1.2)
Total Protein: 6.9 g/dL (ref 6.5–8.1)

## 2019-05-26 LAB — TYPE AND SCREEN
ABO/RH(D): A POS
Antibody Screen: NEGATIVE

## 2019-05-26 LAB — C-REACTIVE PROTEIN: CRP: 6.2 mg/dL — ABNORMAL HIGH (ref <1.0)

## 2019-05-26 LAB — BRAIN NATRIURETIC PEPTIDE: B Natriuretic Peptide: 604 pg/mL — ABNORMAL HIGH (ref 0.0–100.0)

## 2019-05-26 LAB — D-DIMER, QUANTITATIVE: D-Dimer, Quant: 0.78 ug/mL-FEU — ABNORMAL HIGH (ref 0.00–0.50)

## 2019-05-26 LAB — FERRITIN: Ferritin: 45 ng/mL (ref 11–307)

## 2019-05-26 LAB — MAGNESIUM: Magnesium: 2.1 mg/dL (ref 1.7–2.4)

## 2019-05-26 MED ORDER — METHYLPREDNISOLONE SODIUM SUCC 40 MG IJ SOLR
40.0000 mg | Freq: Every day | INTRAMUSCULAR | Status: DC
  Administered 2019-05-27: 40 mg via INTRAVENOUS
  Filled 2019-05-26: qty 1

## 2019-05-26 MED ORDER — CEPASTAT 14.5 MG MT LOZG
1.0000 | LOZENGE | OROMUCOSAL | Status: DC | PRN
  Administered 2019-05-26 – 2019-05-27 (×2): 1 via BUCCAL
  Filled 2019-05-26: qty 9

## 2019-05-26 MED ORDER — DILTIAZEM HCL 25 MG/5ML IV SOLN
15.0000 mg | Freq: Once | INTRAVENOUS | Status: AC
  Administered 2019-05-26: 15 mg via INTRAVENOUS
  Filled 2019-05-26 (×2): qty 5

## 2019-05-26 MED ORDER — INSULIN GLARGINE 100 UNIT/ML ~~LOC~~ SOLN
10.0000 [IU] | Freq: Every day | SUBCUTANEOUS | Status: DC
  Administered 2019-05-26 – 2019-05-28 (×3): 10 [IU] via SUBCUTANEOUS
  Filled 2019-05-26 (×3): qty 0.1

## 2019-05-26 MED ORDER — DILTIAZEM HCL 60 MG PO TABS
60.0000 mg | ORAL_TABLET | Freq: Three times a day (TID) | ORAL | Status: DC
  Administered 2019-05-26 – 2019-05-28 (×7): 60 mg via ORAL
  Filled 2019-05-26 (×8): qty 1

## 2019-05-26 NOTE — Progress Notes (Signed)
PROGRESS NOTE                                                                                                                                                                                                             Patient Demographics:    Melinda Mckinney, is a 63 y.o. female, DOB - [birth year], [id number]  Outpatient Primary MD for the patient is Colon Branch, MD   Admit date - 05/24/2019   LOS - 1  Chief Complaint  Patient presents with   Abdominal Pain       Brief Narrative: Patient is a 63 y.o. female with PMHx of s/p PCI, atrial fibrillation, chronic diastolic heart failure, DM, HTN, stage I cervical CA s/p cryotherapy-who was hospitalized at this facility from 9/15-9/17 after presenting with GI symptoms and fever, she was found to have COVID-19.  She was managed with supportive care and subsequently discharged home on Decadron.  For 6 days prior to this current hospital admission-patient developed nausea, vomiting, upper abdominal pain and exertional dyspnea.  Was found to have A. fib with RVR, chest x-ray was positive for multifocal infiltrates-CT of the abdomen was negative for acute abnormalities-patient was started on Cardizem infusion-and subsequently admitted to the hospitalist service.   Subjective:   Patient in bed, appears comfortable, denies any headache, no fever, no chest pain or pressure, no shortness of breath , no abdominal pain. No focal weakness.    Assessment  & Plan :   PNA with gastroenteritis-COVID-19 initially positive on 9/15: Her main issue seems to be gastroenteritis which is much improved, not hypoxic, chest x-ray and CT scan do show infiltrate which seem to have progressed since last admission.  Does have elevated CRP for which she is on IV steroids and will be continued but will start tapering on 05/26/2019.  Clinically much improved continue present line of care and advance activity.   Respiratory viral panel along with Legionella urinary antigen are pending.  COVID-19 Labs: Recent Labs    05/25/19 0530  DDIMER 0.55*  FERRITIN 44  CRP 11.1*    Lab Results  Component Value Date   SARSCOV2NAA POSITIVE (A) 05/24/2019   SARSCOV2NAA POSITIVE (A) 04/30/2019     COVID-19 Medications: Steroids:10/9>> Remdesivir:not indicated-as >3weeks since initial diagnoses Actemra:Not indicated Convalescent Plasma:N/A Research Studies:N/A    A. fib with RVR: Rate  controlled-start oral Cardizem-attempt to titrate of Cardizem infusion.  Continue sotalol-remains on Xarelto.  Chronic systolic heart failure (EF 45-50 on 07/27/2017): Volume status stable-1 dose of Lasix-see above.  HTN: Controlled-continue lisinopril  Hypothyroidism: Continue Synthroid  History of fibromyalgia: Continue Flexeril as needed  Obesity: BMI of 42 follow with PCP for weight loss.  DM-2: CBG stable-continue SSI-Metformin on hold, will add low-dose Lantus for better control.  Lab Results  Component Value Date   HGBA1C 6.1 (H) 05/01/2019   CBG (last 3)  Recent Labs    05/25/19 2009 05/25/19 2032 05/26/19 0718  GLUCAP 250* 244* 217*    Condition - Stable  Family Communication  : Been updated over the phone on 05/26/2019.  Code Status :  Full Code  Diet :  Diet Order            Diet heart healthy/carb modified Room service appropriate? Yes; Fluid consistency: Thin  Diet effective now               Disposition Plan  :  Remain hospitalized, likely discharge in the next 1 to 2 days if improves clinically  Barriers to discharge: Elevated inflammatory markers-pneumonia-on IV steroids  Consults  :  None  Procedures  :  None  GI prophylaxis: None  DVT Prophylaxis  :  Xarelto  Antibiotics  :    Anti-infectives (From admission, onward)   Start     Dose/Rate Route Frequency Ordered Stop   05/25/19 2130  vancomycin (VANCOCIN) 1,500 mg in sodium chloride 0.9 % 500 mL IVPB  Status:   Discontinued     1,500 mg 250 mL/hr over 120 Minutes Intravenous Every 24 hours 05/24/19 2113 05/25/19 0330   05/25/19 0530  aztreonam (AZACTAM) injection 2 g  Status:  Discontinued     2 g Intramuscular Every 8 hours 05/24/19 2115 05/25/19 0330   05/24/19 2230  vancomycin (VANCOCIN) IVPB 1000 mg/200 mL premix  Status:  Discontinued     1,000 mg 200 mL/hr over 60 Minutes Intravenous  Once 05/24/19 2105 05/25/19 0330   05/24/19 2121  vancomycin (VANCOCIN) 1000 MG powder    Note to Pharmacy: Woodward Ku   : cabinet override      05/24/19 2121 05/25/19 0929   05/24/19 2121  vancomycin (VANCOCIN) 500 MG powder    Note to Pharmacy: Woodward Ku   : cabinet override      05/24/19 2121 05/25/19 0929   05/24/19 2121  aztreonam (AZACTAM) 1 g injection    Note to Pharmacy: Woodward Ku   : cabinet override      05/24/19 2121 05/25/19 0929   05/24/19 2115  aztreonam (AZACTAM) 2 g in sodium chloride 0.9 % 100 mL IVPB     2 g 200 mL/hr over 30 Minutes Intravenous  Once 05/24/19 2101 05/24/19 2204   05/24/19 2115  metroNIDAZOLE (FLAGYL) IVPB 500 mg     500 mg 100 mL/hr over 60 Minutes Intravenous  Once 05/24/19 2101 05/25/19 0037   05/24/19 2115  vancomycin (VANCOCIN) IVPB 1000 mg/200 mL premix  Status:  Discontinued     1,000 mg 200 mL/hr over 60 Minutes Intravenous  Once 05/24/19 2101 05/24/19 2103   05/24/19 2115  vancomycin (VANCOCIN) IVPB 1000 mg/200 mL premix     1,000 mg 200 mL/hr over 60 Minutes Intravenous  Once 05/24/19 2105 05/25/19 0148      Inpatient Medications  Scheduled Meds:  allopurinol  100 mg Oral QHS   atorvastatin  40 mg Oral  q1800   diltiazem  60 mg Oral Q8H   DULoxetine  60 mg Oral Daily   insulin aspart  0-5 Units Subcutaneous QHS   insulin aspart  0-9 Units Subcutaneous TID WC   levothyroxine  125 mcg Oral QAC breakfast   lisinopril  10 mg Oral Daily   methylPREDNISolone (SOLU-MEDROL) injection  40 mg Intravenous Q12H   pantoprazole  40 mg  Oral Daily   pneumococcal 23 valent vaccine  0.5 mL Intramuscular Tomorrow-1000   rivaroxaban  20 mg Oral Q supper   sodium chloride flush  3 mL Intravenous Q12H   sodium chloride flush  3 mL Intravenous Q12H   sotalol  120 mg Oral Q12H   Continuous Infusions:  sodium chloride     PRN Meds:.sodium chloride, acetaminophen, cyclobenzaprine, HYDROcodone-acetaminophen, morphine injection, ondansetron **OR** ondansetron (ZOFRAN) IV, phenol-menthol, sodium chloride flush, zolpidem   Time Spent in minutes  25  See all Orders from today for further details   Lala Lund M.D on 05/26/2019 at 10:11 AM  To page go to [email or web address] - use universal password  [facility] -  Office  [phone]    Objective:   Vitals:   05/26/19 0220 05/26/19 0225 05/26/19 0405 05/26/19 0727  BP:  106/77 107/71 (!) 125/111  Pulse: 74 75 68 100  Resp: 19 18 20 18   Temp:   97.9 F (36.6 C) 98.2 F (36.8 C)  TempSrc:   Oral Oral  SpO2: 93% 95% 94% 98%  Weight:      Height:        Wt Readings from Last 3 Encounters:  05/25/19 117 kg  05/02/19 111.7 kg  01/30/19 112.6 kg     Intake/Output Summary (Last 24 hours) at 05/26/2019 1011 Last data filed at 05/25/2019 1700 Gross per 24 hour  Intake 480 ml  Output 900 ml  Net -420 ml     Physical Exam  Awake Alert,   No new F.N deficits, Normal affect [place].AT,PERRAL Supple Neck,No JVD, No cervical lymphadenopathy appriciated.  Symmetrical Chest wall movement, Good air movement bilaterally, CTAB RRR,No Gallops, Rubs or new Murmurs, No Parasternal Heave +ve B.Sounds, Abd Soft, No tenderness, No organomegaly appriciated, No rebound - guarding or rigidity. No Cyanosis, Clubbing or edema, No new Rash or bruise    Data Review:    CBC Recent Labs  Lab 05/24/19 2028 05/25/19 0530  WBC 6.3 7.7  HGB 10.4* 10.1*  HCT 34.4* 33.1*  PLT 193 208  MCV 93.2 94.0  MCH 28.2 28.7  MCHC 30.2 30.5  RDW 15.1 15.3  LYMPHSABS 1.3 0.6*    MONOABS 0.3 0.1  EOSABS 0.3 0.0  BASOSABS 0.0 0.0    Chemistries  Recent Labs  Lab 05/24/19 2028 05/24/19 2030 05/25/19 0530  NA 138  --  138  K 3.3*  --  4.4  CL 105  --  108  CO2 20*  --  20*  GLUCOSE 245*  --  214*  BUN 9  --  9  CREATININE 0.83  --  0.79  CALCIUM 8.7*  --  8.4*  MG  --  1.4*  --   AST 18  --  13*  ALT 12  --  12  ALKPHOS 92  --  96  BILITOT 0.4  --  0.6   ------------------------------------------------------------------------------------------------------------------ No results for input(s): CHOL, HDL, LDLCALC, TRIG, CHOLHDL, LDLDIRECT in the last 72 hours.  Lab Results  Component Value Date   HGBA1C 6.1 (H) 05/01/2019   ------------------------------------------------------------------------------------------------------------------ Recent  Labs    05/25/19 0530  TSH 1.627   ------------------------------------------------------------------------------------------------------------------ Recent Labs    05/25/19 0530  FERRITIN 44    Coagulation profile No results for input(s): INR, PROTIME in the last 168 hours.  Recent Labs    05/25/19 0530  DDIMER 0.55*    Cardiac Enzymes No results for input(s): CKMB, TROPONINI, MYOGLOBIN in the last 168 hours.  Invalid input(s): CK ------------------------------------------------------------------------------------------------------------------    Component Value Date/Time   BNP 205.9 (H) 05/25/2019 0530    Micro Results Recent Results (from the past 240 hour(s))  SARS Coronavirus 2 by RT PCR (hospital order, performed in [facility] hospital lab) Nasopharyngeal Nasopharyngeal Swab     Status: Abnormal   Collection Time: 05/24/19  8:28 PM   Specimen: Nasopharyngeal Swab  Result Value Ref Range Status   SARS Coronavirus 2 POSITIVE (A) NEGATIVE Final    Comment: RESULT CALLED TO, READ BACK BY AND VERIFIED WITH: NEAL,K AT 2134 ON 100920 BY CHERESNOWSKY,T (NOTE) If result is  NEGATIVE SARS-CoV-2 target nucleic acids are NOT DETECTED. The SARS-CoV-2 RNA is generally detectable in upper and lower  respiratory specimens during the acute phase of infection. The lowest  concentration of SARS-CoV-2 viral copies this assay can detect is 250  copies / mL. A negative result does not preclude SARS-CoV-2 infection  and should not be used as the sole basis for treatment or other  patient management decisions.  A negative result may occur with  improper specimen collection / handling, submission of specimen other  than nasopharyngeal swab, presence of viral mutation(s) within the  areas targeted by this assay, and inadequate number of viral copies  (<250 copies / mL). A negative result must be combined with clinical  observations, patient history, and epidemiological information. If result is POSITIVE SARS-CoV-2 target nucleic acids are DETEC TED. The SARS-CoV-2 RNA is generally detectable in upper and lower  respiratory specimens during the acute phase of infection.  Positive  results are indicative of active infection with SARS-CoV-2.  Clinical  correlation with patient history and other diagnostic information is  necessary to determine patient infection status.  Positive results do  not rule out bacterial infection or co-infection with other viruses. If result is PRESUMPTIVE POSTIVE SARS-CoV-2 nucleic acids MAY BE PRESENT.   A presumptive positive result was obtained on the submitted specimen  and confirmed on repeat testing.  While 2019 novel coronavirus  (SARS-CoV-2) nucleic acids may be present in the submitted sample  additional confirmatory testing may be necessary for epidemiological  and / or clinical management purposes  to differentiate between  SARS-CoV-2 and other Sarbecovirus currently known to infect humans.  If clinically indicated additional testing with an alternate test  methodology (LAB7 453) is advised. The SARS-CoV-2 RNA is generally  detectable  in upper and lower respiratory specimens during the acute  phase of infection. The expected result is Negative. Fact Sheet for Patients:  StrictlyIdeas.no Fact Sheet for Healthcare Providers: BankingDealers.co.za This test is not yet approved or cleared by the [place] FDA and has been authorized for detection and/or diagnosis of SARS-CoV-2 by FDA under an Emergency Use Authorization (EUA).  This EUA will remain in effect (meaning this test can be used) for the duration of the COVID-19 declaration under Section 564(b)(1) of the Act, 21 U.S.C. section 360bbb-3(b)(1), unless the authorization is terminated or revoked sooner. Performed at [facility], [address] [place], [place] [address]   Culture, blood (routine x 2)  Status: None (Preliminary result)   Collection Time: 05/24/19  8:37 PM   Specimen: BLOOD  Result Value Ref Range Status   Specimen Description   Final    BLOOD LEFT ANTECUBITAL Performed at [facility], [address] [place], [place] [address]    Special Requests   Final    BOTTLES DRAWN AEROBIC AND ANAEROBIC Blood Culture results may not be optimal due to an inadequate volume of blood received in culture bottles Performed at [facility], [address] [place], [place] [address]    Culture   Final    NO GROWTH < 24 HOURS Performed at [facility], [place] [address], [place], [place] [address]    Report Status PENDING  Incomplete  Culture, blood (routine x 2)     Status: None (Preliminary result)   Collection Time: 05/24/19  8:53 PM   Specimen: BLOOD  Result Value Ref Range Status   Specimen Description   Final    BLOOD RIGHT ANTECUBITAL Performed at [facility], [address] [place], [place] [address]    Special Requests   Final    BOTTLES DRAWN AEROBIC AND ANAEROBIC Blood Culture adequate volume Performed at [facility], [address] [place], [place] [address]    Culture   Final    NO GROWTH < 24 HOURS Performed at [facility], [place] [address] [place], [place] [address]    Report Status PENDING  Incomplete    Radiology Reports Ct Abdomen Pelvis W Contrast  Result Date: 05/24/2019 CLINICAL DATA:  An acute generalized abdominal pain diarrhea EXAM: CT ABDOMEN AND PELVIS WITH CONTRAST TECHNIQUE: Multidetector CT imaging of the abdomen and pelvis was performed using the standard protocol following bolus administration of intravenous contrast. CONTRAST:  37mL OMNIPAQUE IOHEXOL 300 MG/ML  SOLN COMPARISON:  CT 07/30/2012 FINDINGS: Lower chest: Multifocal airspace consolidation and surrounding ground-glass opacity in both lungs with bilateral effusions. Bandlike opacities likely reflect associated atelectasis and/or scarring. Thickening and fluid-filled distal airways are noted. Cardiomegaly with biatrial enlargement. No pericardial effusion. Atherosclerotic calcification of the coronary arteries. Hepatobiliary: No focal liver abnormality is seen. No gallstones, gallbladder wall thickening, or biliary dilatation. Pancreas: Unremarkable. No pancreatic ductal dilatation or surrounding inflammatory changes. Spleen: Few punctate calcifications present within the spleen. Spleen otherwise unremarkable. Adrenals/Urinary Tract: Normal adrenal glands. Surgical absence of the right kidney. No abnormal soft tissue in the nephrectomy bed. Mild lobulation of the left kidney without concerning renal mass, calculus or hydronephrosis. No urinary tract dilatation. Urinary bladder is unremarkable. Stomach/Bowel: Distal esophagus, stomach and duodenal sweep are unremarkable. No small bowel wall thickening or dilatation. Interposition of several small bowel loops and portion of the colon anterior to the liver. No resulting obstruction. A normal appendix is visualized. No colonic dilatation or wall thickening. Vascular/Lymphatic: Atherosclerotic  plaque within the normal caliber aorta. No suspicious or enlarged lymph nodes in the included lymphatic chains. Reproductive: Normal appearance of the uterus and adnexal structures. Postsurgical changes from prior Caesarean section. Other: Postsurgical changes of the low anterior abdomen, likely related prior Caesarean section within enlarging incisional hernia involving the right lateral aspect of the incisional line. Herniated portion of the fat now measures approximately 13.4 x 3.3 cm in size. Hernia defect measures approximately 3.5 by 1.4 cm transverse by craniocaudal. No abnormal stranding within the herniated fat. No bowel containing hernia. No free fluid or free air within the abdomen or pelvis.  Musculoskeletal: Mild degenerative changes in the spine most pronounced at L5-S1. No acute osseous abnormality or suspicious osseous lesion. IMPRESSION: 1. Multifocal airspace consolidation and surrounding ground-glass opacity in both lungs with bilateral effusions, concerning for multifocal pneumonia including possible atypical viral etiologies such as COVID-19. 2. No acute intra-abdominal process. 3. Postsurgical changes from prior Caesarean section within enlarging fat containing incisional hernia involving the right lateral aspect of the incisional line. No abnormal stranding within the herniated fat. 4. Aortic Atherosclerosis (ICD10-I70.0). Electronically Signed   By: Lovena Le M.D.   On: 05/24/2019 22:41   Dg Chest Portable 1 View  Result Date: 05/24/2019 CLINICAL DATA:  Shortness of breath, abdominal pain and diarrhea EXAM: PORTABLE CHEST 1 VIEW COMPARISON:  Radiograph 04/30/2019. Same-day CT abdomen pelvis. FINDINGS: Cardiomegaly, as seen on CT. Increased from comparison studies. Widespread bilateral multifocal airspace opacities throughout both lungs with associated volume loss. Partial obscuration of the hemidiaphragm suggest the pleural effusions also seen on CT. No pneumothorax. No acute osseous or  soft tissue abnormality. IMPRESSION: 1. Widespread bilateral multifocal airspace opacities with associated volume loss, consistent with multifocal pneumonia including potential atypical viral etiologies such as COVID-19. 2. New cardiomegaly. Electronically Signed   By: Lovena Le M.D.   On: 05/24/2019 22:44   Dg Chest Port 1 View  Result Date: 04/30/2019 CLINICAL DATA:  Fever and body ache EXAM: PORTABLE CHEST 1 VIEW COMPARISON:  07/27/2017 FINDINGS: Mild cardiomegaly. Similar appearance of diffuse increased interstitial opacity, therefore felt chronic. No acute focal airspace disease or effusion. No pneumothorax. IMPRESSION: No active disease. Stable cardiomegaly and diffuse interstitial prominence. Electronically Signed   By: Donavan Foil M.D.   On: 04/30/2019 17:49

## 2019-05-26 NOTE — Progress Notes (Signed)
Called and updated patient's son, Pilar Plate.  Answered all his questions and addressed all concerns.

## 2019-05-26 NOTE — Progress Notes (Signed)
Spoke with Pilar Plate , the patient's son regarding the patient's condition.  No questions at this time.

## 2019-05-27 LAB — FERRITIN: Ferritin: 36 ng/mL (ref 11–307)

## 2019-05-27 LAB — CBC WITH DIFFERENTIAL/PLATELET
Abs Immature Granulocytes: 0.12 10*3/uL — ABNORMAL HIGH (ref 0.00–0.07)
Basophils Absolute: 0 10*3/uL (ref 0.0–0.1)
Basophils Relative: 0 %
Eosinophils Absolute: 0 10*3/uL (ref 0.0–0.5)
Eosinophils Relative: 0 %
HCT: 32.7 % — ABNORMAL LOW (ref 36.0–46.0)
Hemoglobin: 9.9 g/dL — ABNORMAL LOW (ref 12.0–15.0)
Immature Granulocytes: 1 %
Lymphocytes Relative: 14 %
Lymphs Abs: 1.5 10*3/uL (ref 0.7–4.0)
MCH: 28.9 pg (ref 26.0–34.0)
MCHC: 30.3 g/dL (ref 30.0–36.0)
MCV: 95.6 fL (ref 80.0–100.0)
Monocytes Absolute: 0.7 10*3/uL (ref 0.1–1.0)
Monocytes Relative: 6 %
Neutro Abs: 8.7 10*3/uL — ABNORMAL HIGH (ref 1.7–7.7)
Neutrophils Relative %: 79 %
Platelets: 313 10*3/uL (ref 150–400)
RBC: 3.42 MIL/uL — ABNORMAL LOW (ref 3.87–5.11)
RDW: 15.3 % (ref 11.5–15.5)
WBC: 11 10*3/uL — ABNORMAL HIGH (ref 4.0–10.5)
nRBC: 0.3 % — ABNORMAL HIGH (ref 0.0–0.2)

## 2019-05-27 LAB — LEGIONELLA PNEUMOPHILA SEROGP 1 UR AG: L. pneumophila Serogp 1 Ur Ag: NEGATIVE

## 2019-05-27 LAB — GLUCOSE, CAPILLARY
Glucose-Capillary: 134 mg/dL — ABNORMAL HIGH (ref 70–99)
Glucose-Capillary: 194 mg/dL — ABNORMAL HIGH (ref 70–99)
Glucose-Capillary: 252 mg/dL — ABNORMAL HIGH (ref 70–99)
Glucose-Capillary: 232 mg/dL — ABNORMAL HIGH (ref 70–99)

## 2019-05-27 LAB — COMPREHENSIVE METABOLIC PANEL
ALT: 17 U/L (ref 0–44)
AST: 20 U/L (ref 15–41)
Albumin: 2.9 g/dL — ABNORMAL LOW (ref 3.5–5.0)
Alkaline Phosphatase: 100 U/L (ref 38–126)
Anion gap: 9 (ref 5–15)
BUN: 36 mg/dL — ABNORMAL HIGH (ref 8–23)
CO2: 22 mmol/L (ref 22–32)
Calcium: 9.6 mg/dL (ref 8.9–10.3)
Chloride: 106 mmol/L (ref 98–111)
Creatinine, Ser: 1.13 mg/dL — ABNORMAL HIGH (ref 0.44–1.00)
GFR calc Af Amer: 59 mL/min — ABNORMAL LOW (ref >60)
GFR calc non Af Amer: 51 mL/min — ABNORMAL LOW (ref >60)
Glucose, Bld: 135 mg/dL — ABNORMAL HIGH (ref 70–99)
Potassium: 4.7 mmol/L (ref 3.5–5.1)
Sodium: 137 mmol/L (ref 135–145)
Total Bilirubin: 0.4 mg/dL (ref 0.3–1.2)
Total Protein: 6.3 g/dL — ABNORMAL LOW (ref 6.5–8.1)

## 2019-05-27 LAB — C-REACTIVE PROTEIN: CRP: 2.3 mg/dL — ABNORMAL HIGH (ref <1.0)

## 2019-05-27 LAB — MAGNESIUM: Magnesium: 2.1 mg/dL (ref 1.7–2.4)

## 2019-05-27 LAB — LACTATE DEHYDROGENASE: LDH: 176 U/L (ref 98–192)

## 2019-05-27 LAB — D-DIMER, QUANTITATIVE: D-Dimer, Quant: 0.57 ug/mL-FEU — ABNORMAL HIGH (ref 0.00–0.50)

## 2019-05-27 LAB — BRAIN NATRIURETIC PEPTIDE: B Natriuretic Peptide: 330.7 pg/mL — ABNORMAL HIGH (ref 0.0–100.0)

## 2019-05-27 MED ORDER — METHYLPREDNISOLONE SODIUM SUCC 40 MG IJ SOLR
20.0000 mg | Freq: Every day | INTRAMUSCULAR | Status: DC
  Administered 2019-05-28: 20 mg via INTRAVENOUS
  Filled 2019-05-27: qty 1

## 2019-05-27 MED ORDER — LACTATED RINGERS IV SOLN
INTRAVENOUS | Status: AC
  Administered 2019-05-27: 10:00:00 via INTRAVENOUS

## 2019-05-27 NOTE — TOC Initial Note (Signed)
Transition of Care [facility]) - Initial/Assessment Note    Patient Details  Name: Melinda Mckinney MRN: [id number] Date of Birth: 08/12/1956  [facility] CM/SW Contact:    Carles Collet, RN Phone Number: 05/27/2019, 3:20 PM  Clinical Narrative:                 Melinda Mckinney is a 63 y.o. female with medical history significant for coronary artery disease, fibromyalgia, atrial fibrillation on Xarelto, type 2 diabetes mellitus, chronic systolic CHF, hypothyroidism, and admission last month with COVID-19 infection, now returning to the emergency department for evaluation of abdominal pain, diarrhea, and shortness of breath. Anticipate DC to home w [facility] services, currently on RA. CM will continue to follow.    Expected Discharge Plan: [place] Barriers to Discharge: Continued Medical Work up   Patient Goals and CMS Choice        Expected Discharge Plan and Services Expected Discharge Plan: [place]                                              Prior Living Arrangements/Services   Lives with:: Spouse                   Activities of Daily Living Home Assistive Devices/Equipment: None ADL Screening (condition at time of admission) Patient's cognitive ability adequate to safely complete daily activities?: Yes Is the patient deaf or have difficulty hearing?: No Does the patient have difficulty seeing, even when wearing glasses/contacts?: No Does the patient have difficulty concentrating, remembering, or making decisions?: No Patient able to express need for assistance with ADLs?: Yes Does the patient have difficulty dressing or bathing?: No Independently performs ADLs?: Yes (appropriate for developmental age) Communication: Independent Dressing (OT): Independent Grooming: Independent Feeding: Independent Bathing: Independent Toileting: Independent In/Out Bed: Independent Walks in Home: Independent Does the patient have  difficulty walking or climbing stairs?: No Weakness of Legs: None Weakness of Arms/Hands: None  Permission Sought/Granted                  Emotional Assessment              Admission diagnosis:  Diarrhea, Weakness, Cou Patient Active Problem List   Diagnosis Date Noted  . Pneumonia due to COVID-19 virus 05/25/2019  . Hypokalemia 05/25/2019  . Hypomagnesemia 05/25/2019  . COVID-19 05/01/2019  . 2019 novel coronavirus disease (COVID-19) 04/30/2019  . Encounter for monitoring sotalol therapy 08/03/2017  . Chronic systolic CHF (congestive heart failure) ([place]) 07/27/2017  . Acute congestive heart failure ([place]) 07/27/2017  . Precordial pain   . Atrial fibrillation with RVR ([place]) 06/29/2017  . Hyperglycemia 01/06/2017  . PCP NOTES >>>> 06/16/2015  . Back pain 02/05/2015  . Hypothyroidism 01/19/2015  . Vitamin D deficiency   . Vulvar atrophy 08/20/2014  . Annual physical exam 07/24/2014  . Anemia 07/24/2014  . Insomnia 09/27/2013  . Intertrigo 09/27/2013  . Urolithiasis 09/27/2013  . Essential hypertension   . GERD, h/o achalasia s/p heller ~2009)   . Hyperlipidemia LDL goal <70   . Coronary artery disease involving native coronary artery of native heart with angina pectoris ([place])   . Osteoporosis   . Fibromyalgia    PCP:  Colon Branch, MD Pharmacy:   [place], [place] - [address] [place] [address] Phone: [phone] Fax: [phone]     Social Determinants of Health (SDOH) Interventions    Readmission Risk Interventions No flowsheet data found.

## 2019-05-27 NOTE — Progress Notes (Signed)
Spoke with Pilar Plate, the patient's son.  Answered all questions.

## 2019-05-27 NOTE — Evaluation (Signed)
Physical Therapy Evaluation Patient Details Name: Melinda Mckinney MRN: [id number] DOB: 1955/10/31 Today's Date: 05/27/2019   History of Present Illness  63 y.o. female with PMHx of s/p PCI, atrial fibrillation, chronic diastolic heart failure, DM, HTN, stage I cervical CA s/p cryotherapy-who was hospitalized at this facility from 9/15-9/17 after presenting with GI symptoms and fever, she was found to have COVID-19. Increasing GI symptoms, afib with RVR, and incr dyspnea and presented to ED 05/24/19  Clinical Impression   Pt admitted with above diagnosis. Patient very weak and orthostatic on evaluation. Unable to progress to OOB or ambulation. RN made aware and she wanted to follow-up with MD herself (she reports pt had med changes this morning that might be causing orthostasis). Using interpreter, pt reports h/o multiple falls due to rt knee pain and surgery. Reports she only owns a cane and may benefit from RW vs rollator.  Pt currently with functional limitations due to the deficits listed below (see PT Problem List). Pt will benefit from skilled PT to increase their independence and safety with mobility to allow discharge to the venue listed below.       Follow Up Recommendations Home health PT;Supervision for mobility/OOB    Equipment Recommendations  Rolling walker with 5" wheels(vs rollator; TBA)    Recommendations for Other Services OT consult     Precautions / Restrictions Precautions Precautions: Other (comment) Precaution Comments: monitor for orthostasis      Mobility  Bed Mobility Overal bed mobility: Modified Independent             General bed mobility comments: supine <> sit  Transfers Overall transfer level: Needs assistance Equipment used: 1 person hand held assist Transfers: Sit to/from Stand Sit to Stand: Min assist         General transfer comment: pt reaching to pull on IV pole as she does not have her cane; provided HHA; stood ~2 minutes (donning  gown and due to reported dizziness) pt then insisting on lying down and BP 84/60.  Ambulation/Gait             General Gait Details: unable due to orthostasis; RN in to assess  Stairs            Wheelchair Mobility    Modified Rankin (Stroke Patients Only)       Balance                                             Pertinent Vitals/Pain Pain Assessment: Faces Faces Pain Scale: Hurts little more Pain Location: rt knee Pain Descriptors / Indicators: Aching Pain Intervention(s): Limited activity within patient's tolerance;Monitored during session;Repositioned    Home Living Family/patient expects to be discharged to:: Private residence Living Arrangements: Spouse/significant other Available Help at Discharge: Family;Available 24 hours/day Type of Home: House Home Access: Other (comment)(electric chair lift)     Home Layout: Multi-level;Other (Comment)(electric lift for stairs) Home Equipment: Grab bars - tub/shower;Cane - single point(electric chair for the stairs)      Prior Function Level of Independence: Independent with assistive device(s)         Comments: uses cane     Hand Dominance   Dominant Hand: Right    Extremity/Trunk Assessment   Upper Extremity Assessment Upper Extremity Assessment: Generalized weakness    Lower Extremity Assessment Lower Extremity Assessment: Generalized weakness;RLE deficits/detail RLE Deficits / Details: reports  prior surgery with limited ROM (noted knee flexion ~90 at EOB; pt became orthostatic and returned to supine)    Cervical / Trunk Assessment Cervical / Trunk Assessment: Other exceptions Cervical / Trunk Exceptions: obese  Communication   Communication: Prefers language other than [place];Interpreter utilized(Edgar [id number])  Cognition Arousal/Alertness: Awake/alert Behavior During Therapy: WFL for tasks assessed/performed Overall Cognitive Status: Within Functional Limits for tasks  assessed                                 General Comments: She often speaks over the interpreter, difficult to understand her answers and have to have interpreter repeat      General Comments General comments (skin integrity, edema, etc.): Pt denies h/o dizziness prior to corona virus; RN notes MD increased heart meds due to afib with RVR and likely cause of orthostasis    Exercises     Assessment/Plan    PT Assessment Patient needs continued PT services  PT Problem List Decreased strength;Decreased range of motion;Decreased activity tolerance;Decreased mobility;Decreased knowledge of use of DME;Cardiopulmonary status limiting activity;Obesity;Pain       PT Treatment Interventions DME instruction;Gait training;Functional mobility training;Therapeutic activities;Therapeutic exercise;Patient/family education    PT Goals (Current goals can be found in the Care Plan section)  Acute Rehab PT Goals Patient Stated Goal: to feel stronger PT Goal Formulation: With patient Time For Goal Achievement: 06/10/19 Potential to Achieve Goals: Good    Frequency Min 3X/week   Barriers to discharge        Co-evaluation               AM-PAC PT "6 Clicks" Mobility  Outcome Measure Help needed turning from your back to your side while in a flat bed without using bedrails?: None Help needed moving from lying on your back to sitting on the side of a flat bed without using bedrails?: None Help needed moving to and from a bed to a chair (including a wheelchair)?: A Little Help needed standing up from a chair using your arms (e.g., wheelchair or bedside chair)?: A Little Help needed to walk in hospital room?: Total Help needed climbing 3-5 steps with a railing? : Total 6 Click Score: 16    End of Session   Activity Tolerance: Treatment limited secondary to medical complications (Comment) Patient left: in bed;with call bell/phone within reach;with bed alarm set;with  nursing/sitter in room Nurse Communication: Other (comment)(orthostasis) PT Visit Diagnosis: Difficulty in walking, not elsewhere classified (R26.2);Muscle weakness (generalized) (M62.81)    Time: 1125-1202 PT Time Calculation (min) (ACUTE ONLY): 37 min   Charges:   PT Evaluation $PT Eval Low Complexity: 1 Low PT Treatments $Therapeutic Activity: 8-22 mins          Barry Brunner, PT      Melinda Mckinney 05/27/2019, 12:16 PM

## 2019-05-27 NOTE — Progress Notes (Signed)
Spoke to patient's son and updated him on patient's status.  All questions answered and concerns addressed.  See chart for nsg assessment.

## 2019-05-27 NOTE — Progress Notes (Signed)
PROGRESS NOTE                                                                                                                                                                                                             Patient Demographics:    Melinda Mckinney, is a 63 y.o. female, DOB - [birth year], FA:[id number]  Outpatient Primary MD for the patient is Colon Branch, MD   Admit date - 05/24/2019   LOS - 2  Chief Complaint  Patient presents with   Abdominal Pain       Brief Narrative: Patient is a 63 y.o. female with PMHx of s/p PCI, atrial fibrillation, chronic diastolic heart failure, DM, HTN, stage I cervical CA s/p cryotherapy-who was hospitalized at this facility from 9/15-9/17 after presenting with GI symptoms and fever, she was found to have COVID-19.  She was managed with supportive care and subsequently discharged home on Decadron.  For 6 days prior to this current hospital admission-patient developed nausea, vomiting, upper abdominal pain and exertional dyspnea.  Was found to have A. fib with RVR, chest x-ray was positive for multifocal infiltrates-CT of the abdomen was negative for acute abnormalities-patient was started on Cardizem infusion-and subsequently admitted to the hospitalist service.   Subjective:   Patient in bed, appears comfortable, denies any headache, no fever, no chest pain or pressure, no shortness of breath , no abdominal pain. No focal weakness.   Assessment  & Plan :   PNA with gastroenteritis-COVID-19 initially positive on 9/15: Her main issue seems to be gastroenteritis which is much improved, not hypoxic, chest x-ray and CT scan do show infiltrate which seem to have progressed since last admission.  Does have elevated CRP for which she is on IV steroids and will be continued but will start tapering on 05/26/2019.  Clinically much improved continue present line of care and advance activity.   Respiratory viral panel along with Legionella urinary antigen are pending.  COVID-19 Labs:  Recent Labs    05/25/19 0530 05/26/19 0720 05/27/19 0245  DDIMER 0.55* 0.78* 0.57*  FERRITIN 44 45 36  LDH  --   --  176  CRP 11.1* 6.2* 2.3*    Lab Results  Component Value Date   SARSCOV2NAA POSITIVE (A) 05/24/2019   SARSCOV2NAA POSITIVE (A) 04/30/2019     COVID-19 Medications: Steroids:10/9>> Remdesivir:not  indicated - as > 3weeks since initial diagnoses Actemra:Not indicated Convalescent Plasma:N/A Research Studies:N/A    Dehydration with AKI.  Gentle IV fluids on 05/27/2019, hold ACE inhibitor.  Repeat BMP in the morning.    A. fib with RVR: Continue combination of Cardizem and sotalol -remains on Xarelto.  Chronic systolic heart failure (EF 45-50 on 07/27/2017): Volume status stable-1 dose of Lasix-see above.  HTN: Controlled-continue beta-blocker, Cardizem, hold ACE inhibitor due to AKI.  Hypothyroidism: Continue Synthroid  History of fibromyalgia: Continue Flexeril as needed  Obesity: BMI of 42 follow with PCP for weight loss.  DM-2: CBG stable-continue SSI-Metformin on hold, will add low-dose Lantus for better control.  Lab Results  Component Value Date   HGBA1C 6.1 (H) 05/01/2019   CBG (last 3)  Recent Labs    05/26/19 1710 05/26/19 2044 05/27/19 0740  GLUCAP 220* 222* 134*    Condition - Stable  Family Communication  : Been updated over the phone on 05/26/2019.  Code Status :  Full Code  Diet :  Diet Order            Diet heart healthy/carb modified Room service appropriate? Yes; Fluid consistency: Thin  Diet effective now               Disposition Plan  :  Remain hospitalized, likely discharge in the next 1 to 2 days if improves clinically  Barriers to discharge: Elevated inflammatory markers-pneumonia-on IV steroids  Consults  :  None  Procedures  :  None  GI prophylaxis: None  DVT Prophylaxis  :  Xarelto  Antibiotics  :     Anti-infectives (From admission, onward)   Start     Dose/Rate Route Frequency Ordered Stop   05/25/19 2130  vancomycin (VANCOCIN) 1,500 mg in sodium chloride 0.9 % 500 mL IVPB  Status:  Discontinued     1,500 mg 250 mL/hr over 120 Minutes Intravenous Every 24 hours 05/24/19 2113 05/25/19 0330   05/25/19 0530  aztreonam (AZACTAM) injection 2 g  Status:  Discontinued     2 g Intramuscular Every 8 hours 05/24/19 2115 05/25/19 0330   05/24/19 2230  vancomycin (VANCOCIN) IVPB 1000 mg/200 mL premix  Status:  Discontinued     1,000 mg 200 mL/hr over 60 Minutes Intravenous  Once 05/24/19 2105 05/25/19 0330   05/24/19 2121  vancomycin (VANCOCIN) 1000 MG powder    Note to Pharmacy: Woodward Ku   : cabinet override      05/24/19 2121 05/25/19 0929   05/24/19 2121  vancomycin (VANCOCIN) 500 MG powder    Note to Pharmacy: Woodward Ku   : cabinet override      05/24/19 2121 05/25/19 0929   05/24/19 2121  aztreonam (AZACTAM) 1 g injection    Note to Pharmacy: Woodward Ku   : cabinet override      05/24/19 2121 05/25/19 0929   05/24/19 2115  aztreonam (AZACTAM) 2 g in sodium chloride 0.9 % 100 mL IVPB     2 g 200 mL/hr over 30 Minutes Intravenous  Once 05/24/19 2101 05/24/19 2204   05/24/19 2115  metroNIDAZOLE (FLAGYL) IVPB 500 mg     500 mg 100 mL/hr over 60 Minutes Intravenous  Once 05/24/19 2101 05/25/19 0037   05/24/19 2115  vancomycin (VANCOCIN) IVPB 1000 mg/200 mL premix  Status:  Discontinued     1,000 mg 200 mL/hr over 60 Minutes Intravenous  Once 05/24/19 2101 05/24/19 2103   05/24/19 2115  vancomycin (VANCOCIN) IVPB 1000  mg/200 mL premix     1,000 mg 200 mL/hr over 60 Minutes Intravenous  Once 05/24/19 2105 05/25/19 0148      Inpatient Medications  Scheduled Meds:  allopurinol  100 mg Oral QHS   atorvastatin  40 mg Oral q1800   diltiazem  60 mg Oral Q8H   DULoxetine  60 mg Oral Daily   insulin aspart  0-5 Units Subcutaneous QHS   insulin aspart  0-9 Units  Subcutaneous TID WC   insulin glargine  10 Units Subcutaneous Daily   levothyroxine  125 mcg Oral QAC breakfast   lisinopril  10 mg Oral Daily   methylPREDNISolone (SOLU-MEDROL) injection  40 mg Intravenous Daily   pantoprazole  40 mg Oral Daily   rivaroxaban  20 mg Oral Q supper   sotalol  120 mg Oral Q12H   Continuous Infusions:  PRN Meds:.acetaminophen, cyclobenzaprine, morphine injection, [DISCONTINUED] ondansetron **OR** ondansetron (ZOFRAN) IV, phenol-menthol, zolpidem   Time Spent in minutes  25  See all Orders from today for further details   Lala Lund M.D on 05/27/2019 at 9:43 AM  To page go to [email or web address] - use universal password  [facility] -  Office  [phone]    Objective:   Vitals:   05/26/19 1908 05/27/19 0536 05/27/19 0540 05/27/19 0737  BP: 107/76 115/88 115/88 112/74  Pulse: 97   86  Resp: 19  20 15   Temp: 97.9 F (36.6 C)  97.6 F (36.4 C) 98 F (36.7 C)  TempSrc: Oral  Oral Oral  SpO2: 97%  96% 98%  Weight:      Height:        Wt Readings from Last 3 Encounters:  05/25/19 117 kg  05/02/19 111.7 kg  01/30/19 112.6 kg    No intake or output data in the 24 hours ending 05/27/19 0943   Physical Exam  Awake Alert,  No new F.N deficits, Normal affect [place].AT,PERRAL Supple Neck,No JVD, No cervical lymphadenopathy appriciated.  Symmetrical Chest wall movement, Good air movement bilaterally, CTAB RRR,No Gallops, Rubs or new Murmurs, No Parasternal Heave +ve B.Sounds, Abd Soft, No tenderness, No organomegaly appriciated, No rebound - guarding or rigidity. No Cyanosis, Clubbing or edema, No new Rash or bruise    Data Review:    CBC Recent Labs  Lab 05/24/19 2028 05/25/19 0530 05/26/19 0720 05/27/19 0245  WBC 6.3 7.7 10.1 11.0*  HGB 10.4* 10.1* 9.8* 9.9*  HCT 34.4* 33.1* 33.3* 32.7*  PLT 193 208 263 313  MCV 93.2 94.0 95.7 95.6  MCH 28.2 28.7 28.2 28.9  MCHC 30.2 30.5 29.4* 30.3  RDW 15.1 15.3 15.2 15.3    LYMPHSABS 1.3 0.6* 0.7 1.5  MONOABS 0.3 0.1 0.2 0.7  EOSABS 0.3 0.0 0.0 0.0  BASOSABS 0.0 0.0 0.0 0.0    Chemistries  Recent Labs  Lab 05/24/19 2028 05/24/19 2030 05/25/19 0530 05/26/19 0720 05/27/19 0245  NA 138  --  138 138 137  K 3.3*  --  4.4 4.9 4.7  CL 105  --  108 106 106  CO2 20*  --  20* 21* 22  GLUCOSE 245*  --  214* 212* 135*  BUN 9  --  9 22 36*  CREATININE 0.83  --  0.79 0.98 1.13*  CALCIUM 8.7*  --  8.4* 9.2 9.6  MG  --  1.4*  --  2.1 2.1  AST 18  --  13* 18 20  ALT 12  --  12 14 17   ALKPHOS 92  --  96 93 100  BILITOT 0.4  --  0.6 0.4 0.4   ------------------------------------------------------------------------------------------------------------------ No results for input(s): CHOL, HDL, LDLCALC, TRIG, CHOLHDL, LDLDIRECT in the last 72 hours.  Lab Results  Component Value Date   HGBA1C 6.1 (H) 05/01/2019   ------------------------------------------------------------------------------------------------------------------ Recent Labs    05/25/19 0530  TSH 1.627   ------------------------------------------------------------------------------------------------------------------ Recent Labs    05/26/19 0720 05/27/19 0245  FERRITIN 45 36    Coagulation profile No results for input(s): INR, PROTIME in the last 168 hours.  Recent Labs    05/26/19 0720 05/27/19 0245  DDIMER 0.78* 0.57*    Cardiac Enzymes No results for input(s): CKMB, TROPONINI, MYOGLOBIN in the last 168 hours.  Invalid input(s): CK ------------------------------------------------------------------------------------------------------------------    Component Value Date/Time   BNP 330.7 (H) 05/27/2019 0245    Micro Results Recent Results (from the past 240 hour(s))  SARS Coronavirus 2 by RT PCR (hospital order, performed in [facility] hospital lab) Nasopharyngeal Nasopharyngeal Swab     Status: Abnormal   Collection Time: 05/24/19  8:28 PM   Specimen: Nasopharyngeal Swab   Result Value Ref Range Status   SARS Coronavirus 2 POSITIVE (A) NEGATIVE Final    Comment: RESULT CALLED TO, READ BACK BY AND VERIFIED WITH: NEAL,K AT 2134 ON [id number] BY CHERESNOWSKY,T (NOTE) If result is NEGATIVE SARS-CoV-2 target nucleic acids are NOT DETECTED. The SARS-CoV-2 RNA is generally detectable in upper and lower  respiratory specimens during the acute phase of infection. The lowest  concentration of SARS-CoV-2 viral copies this assay can detect is 250  copies / mL. A negative result does not preclude SARS-CoV-2 infection  and should not be used as the sole basis for treatment or other  patient management decisions.  A negative result may occur with  improper specimen collection / handling, submission of specimen other  than nasopharyngeal swab, presence of viral mutation(s) within the  areas targeted by this assay, and inadequate number of viral copies  (<250 copies / mL). A negative result must be combined with clinical  observations, patient history, and epidemiological information. If result is POSITIVE SARS-CoV-2 target nucleic acids are DETEC TED. The SARS-CoV-2 RNA is generally detectable in upper and lower  respiratory specimens during the acute phase of infection.  Positive  results are indicative of active infection with SARS-CoV-2.  Clinical  correlation with patient history and other diagnostic information is  necessary to determine patient infection status.  Positive results do  not rule out bacterial infection or co-infection with other viruses. If result is PRESUMPTIVE POSTIVE SARS-CoV-2 nucleic acids MAY BE PRESENT.   A presumptive positive result was obtained on the submitted specimen  and confirmed on repeat testing.  While 2019 novel coronavirus  (SARS-CoV-2) nucleic acids may be present in the submitted sample  additional confirmatory testing may be necessary for epidemiological  and / or clinical management purposes  to differentiate between   SARS-CoV-2 and other Sarbecovirus currently known to infect humans.  If clinically indicated additional testing with an alternate test  methodology (LAB7 453) is advised. The SARS-CoV-2 RNA is generally  detectable in upper and lower respiratory specimens during the acute  phase of infection. The expected result is Negative. Fact Sheet for Patients:  StrictlyIdeas.no Fact Sheet for Healthcare Providers: BankingDealers.co.za This test is not yet approved or cleared by the [place] FDA and has been authorized for detection and/or diagnosis of SARS-CoV-2 by FDA under an Emergency Use Authorization (EUA).  This EUA will remain in effect (meaning this  test can be used) for the duration of the COVID-19 declaration under Section 564(b)(1) of the Act, 21 U.S.C. section 360bbb-3(b)(1), unless the authorization is terminated or revoked sooner. Performed at [facility], [address] [place], [place] [address]   Culture, blood (routine x 2)     Status: None (Preliminary result)   Collection Time: 05/24/19  8:37 PM   Specimen: BLOOD  Result Value Ref Range Status   Specimen Description   Final    BLOOD LEFT ANTECUBITAL Performed at [facility], [address] [place], [place] [address]    Special Requests   Final    BOTTLES DRAWN AEROBIC AND ANAEROBIC Blood Culture results may not be optimal due to an inadequate volume of blood received in culture bottles Performed at [facility], [address] [place], [place] [address]    Culture   Final    NO GROWTH 3 DAYS Performed at [facility], [place] [address] [place], [place] [address]    Report Status PENDING  Incomplete  Culture, blood (routine x 2)     Status: None (Preliminary result)   Collection Time: 05/24/19  8:53 PM   Specimen: BLOOD  Result Value Ref Range Status   Specimen Description   Final    BLOOD RIGHT ANTECUBITAL Performed at  [facility], [address] [place], [place] [address]    Special Requests   Final    BOTTLES DRAWN AEROBIC AND ANAEROBIC Blood Culture adequate volume Performed at [facility], [address] [place], [place] [address]    Culture   Final    NO GROWTH 3 DAYS Performed at [facility], [place] [address] [place], [place] [address]    Report Status PENDING  Incomplete  Respiratory Panel by PCR     Status: None   Collection Time: 05/26/19  6:27 AM  Result Value Ref Range Status   Adenovirus NOT DETECTED NOT DETECTED Final   Coronavirus 229E NOT DETECTED NOT DETECTED Final    Comment: (NOTE) The Coronavirus on the Respiratory Panel, DOES NOT test for the novel  Coronavirus (2019 nCoV)    Coronavirus HKU1 NOT DETECTED NOT DETECTED Final   Coronavirus NL63 NOT DETECTED NOT DETECTED Final   Coronavirus OC43 NOT DETECTED NOT DETECTED Final   Metapneumovirus NOT DETECTED NOT DETECTED Final   Rhinovirus / Enterovirus NOT DETECTED NOT DETECTED Final   Influenza A NOT DETECTED NOT DETECTED Final   Influenza B NOT DETECTED NOT DETECTED Final   Parainfluenza Virus 1 NOT DETECTED NOT DETECTED Final   Parainfluenza Virus 2 NOT DETECTED NOT DETECTED Final   Parainfluenza Virus 3 NOT DETECTED NOT DETECTED Final   Parainfluenza Virus 4 NOT DETECTED NOT DETECTED Final   Respiratory Syncytial Virus NOT DETECTED NOT DETECTED Final   Bordetella pertussis NOT DETECTED NOT DETECTED Final   Chlamydophila pneumoniae NOT DETECTED NOT DETECTED Final   Mycoplasma pneumoniae NOT DETECTED NOT DETECTED Final    Comment: Performed at [facility], [address], [place],  [address]    Radiology Reports Ct Abdomen Pelvis W Contrast  Result Date: 05/24/2019 CLINICAL DATA:  An acute generalized abdominal pain diarrhea EXAM: CT ABDOMEN AND PELVIS WITH CONTRAST TECHNIQUE: Multidetector CT imaging of the abdomen and pelvis was performed using the standard protocol  following bolus administration of intravenous contrast. CONTRAST:  38mL OMNIPAQUE IOHEXOL 300 MG/ML  SOLN COMPARISON:  CT  07/30/2012 FINDINGS: Lower chest: Multifocal airspace consolidation and surrounding ground-glass opacity in both lungs with bilateral effusions. Bandlike opacities likely reflect associated atelectasis and/or scarring. Thickening and fluid-filled distal airways are noted. Cardiomegaly with biatrial enlargement. No pericardial effusion. Atherosclerotic calcification of the coronary arteries. Hepatobiliary: No focal liver abnormality is seen. No gallstones, gallbladder wall thickening, or biliary dilatation. Pancreas: Unremarkable. No pancreatic ductal dilatation or surrounding inflammatory changes. Spleen: Few punctate calcifications present within the spleen. Spleen otherwise unremarkable. Adrenals/Urinary Tract: Normal adrenal glands. Surgical absence of the right kidney. No abnormal soft tissue in the nephrectomy bed. Mild lobulation of the left kidney without concerning renal mass, calculus or hydronephrosis. No urinary tract dilatation. Urinary bladder is unremarkable. Stomach/Bowel: Distal esophagus, stomach and duodenal sweep are unremarkable. No small bowel wall thickening or dilatation. Interposition of several small bowel loops and portion of the colon anterior to the liver. No resulting obstruction. A normal appendix is visualized. No colonic dilatation or wall thickening. Vascular/Lymphatic: Atherosclerotic plaque within the normal caliber aorta. No suspicious or enlarged lymph nodes in the included lymphatic chains. Reproductive: Normal appearance of the uterus and adnexal structures. Postsurgical changes from prior Caesarean section. Other: Postsurgical changes of the low anterior abdomen, likely related prior Caesarean section within enlarging incisional hernia involving the right lateral aspect of the incisional line. Herniated portion of the fat now measures approximately 13.4 x  3.3 cm in size. Hernia defect measures approximately 3.5 by 1.4 cm transverse by craniocaudal. No abnormal stranding within the herniated fat. No bowel containing hernia. No free fluid or free air within the abdomen or pelvis. Musculoskeletal: Mild degenerative changes in the spine most pronounced at L5-S1. No acute osseous abnormality or suspicious osseous lesion. IMPRESSION: 1. Multifocal airspace consolidation and surrounding ground-glass opacity in both lungs with bilateral effusions, concerning for multifocal pneumonia including possible atypical viral etiologies such as COVID-19. 2. No acute intra-abdominal process. 3. Postsurgical changes from prior Caesarean section within enlarging fat containing incisional hernia involving the right lateral aspect of the incisional line. No abnormal stranding within the herniated fat. 4. Aortic Atherosclerosis (ICD10-I70.0). Electronically Signed   By: Lovena Le M.D.   On: 05/24/2019 22:41   Dg Chest Portable 1 View  Result Date: 05/24/2019 CLINICAL DATA:  Shortness of breath, abdominal pain and diarrhea EXAM: PORTABLE CHEST 1 VIEW COMPARISON:  Radiograph 04/30/2019. Same-day CT abdomen pelvis. FINDINGS: Cardiomegaly, as seen on CT. Increased from comparison studies. Widespread bilateral multifocal airspace opacities throughout both lungs with associated volume loss. Partial obscuration of the hemidiaphragm suggest the pleural effusions also seen on CT. No pneumothorax. No acute osseous or soft tissue abnormality. IMPRESSION: 1. Widespread bilateral multifocal airspace opacities with associated volume loss, consistent with multifocal pneumonia including potential atypical viral etiologies such as COVID-19. 2. New cardiomegaly. Electronically Signed   By: Lovena Le M.D.   On: 05/24/2019 22:44   Dg Chest Port 1 View  Result Date: 04/30/2019 CLINICAL DATA:  Fever and body ache EXAM: PORTABLE CHEST 1 VIEW COMPARISON:  07/27/2017 FINDINGS: Mild cardiomegaly.  Similar appearance of diffuse increased interstitial opacity, therefore felt chronic. No acute focal airspace disease or effusion. No pneumothorax. IMPRESSION: No active disease. Stable cardiomegaly and diffuse interstitial prominence. Electronically Signed   By: Donavan Foil M.D.   On: 04/30/2019 17:49

## 2019-05-28 ENCOUNTER — Telehealth: Payer: Self-pay | Admitting: Internal Medicine

## 2019-05-28 LAB — GLUCOSE, CAPILLARY
Glucose-Capillary: 129 mg/dL — ABNORMAL HIGH (ref 70–99)
Glucose-Capillary: 140 mg/dL — ABNORMAL HIGH (ref 70–99)

## 2019-05-28 LAB — CBC WITH DIFFERENTIAL/PLATELET
Abs Immature Granulocytes: 0.13 10*3/uL — ABNORMAL HIGH (ref 0.00–0.07)
Basophils Absolute: 0 10*3/uL (ref 0.0–0.1)
Basophils Relative: 0 %
Eosinophils Absolute: 0 10*3/uL (ref 0.0–0.5)
Eosinophils Relative: 0 %
HCT: 33 % — ABNORMAL LOW (ref 36.0–46.0)
Hemoglobin: 10.1 g/dL — ABNORMAL LOW (ref 12.0–15.0)
Immature Granulocytes: 2 %
Lymphocytes Relative: 14 %
Lymphs Abs: 1.2 10*3/uL (ref 0.7–4.0)
MCH: 28.8 pg (ref 26.0–34.0)
MCHC: 30.6 g/dL (ref 30.0–36.0)
MCV: 94 fL (ref 80.0–100.0)
Monocytes Absolute: 0.6 10*3/uL (ref 0.1–1.0)
Monocytes Relative: 7 %
Neutro Abs: 6.3 10*3/uL (ref 1.7–7.7)
Neutrophils Relative %: 77 %
Platelets: 325 10*3/uL (ref 150–400)
RBC: 3.51 MIL/uL — ABNORMAL LOW (ref 3.87–5.11)
RDW: 14.9 % (ref 11.5–15.5)
WBC: 8.2 10*3/uL (ref 4.0–10.5)
nRBC: 0 % (ref 0.0–0.2)

## 2019-05-28 LAB — COMPREHENSIVE METABOLIC PANEL
ALT: 19 U/L (ref 0–44)
AST: 18 U/L (ref 15–41)
Albumin: 3.1 g/dL — ABNORMAL LOW (ref 3.5–5.0)
Alkaline Phosphatase: 93 U/L (ref 38–126)
Anion gap: 12 (ref 5–15)
BUN: 32 mg/dL — ABNORMAL HIGH (ref 8–23)
CO2: 22 mmol/L (ref 22–32)
Calcium: 8.9 mg/dL (ref 8.9–10.3)
Chloride: 102 mmol/L (ref 98–111)
Creatinine, Ser: 1 mg/dL (ref 0.44–1.00)
GFR calc Af Amer: >60 mL/min (ref >60)
GFR calc non Af Amer: 59 mL/min — ABNORMAL LOW (ref >60)
Glucose, Bld: 167 mg/dL — ABNORMAL HIGH (ref 70–99)
Potassium: 4.7 mmol/L (ref 3.5–5.1)
Sodium: 136 mmol/L (ref 135–145)
Total Bilirubin: 0.7 mg/dL (ref 0.3–1.2)
Total Protein: 6.3 g/dL — ABNORMAL LOW (ref 6.5–8.1)

## 2019-05-28 LAB — BRAIN NATRIURETIC PEPTIDE: B Natriuretic Peptide: 345.9 pg/mL — ABNORMAL HIGH (ref 0.0–100.0)

## 2019-05-28 LAB — C-REACTIVE PROTEIN: CRP: 1 mg/dL — ABNORMAL HIGH (ref <1.0)

## 2019-05-28 LAB — LACTATE DEHYDROGENASE: LDH: 152 U/L (ref 98–192)

## 2019-05-28 LAB — FERRITIN: Ferritin: 29 ng/mL (ref 11–307)

## 2019-05-28 LAB — MAGNESIUM: Magnesium: 2.1 mg/dL (ref 1.7–2.4)

## 2019-05-28 LAB — D-DIMER, QUANTITATIVE: D-Dimer, Quant: 0.76 ug/mL-FEU — ABNORMAL HIGH (ref 0.00–0.50)

## 2019-05-28 MED ORDER — CYCLOBENZAPRINE HCL 10 MG PO TABS: 5.0000 mg | ORAL_TABLET | Freq: Two times a day (BID) | ORAL | Status: DC | PRN

## 2019-05-28 MED ORDER — DILTIAZEM HCL 60 MG PO TABS
60.0000 mg | ORAL_TABLET | Freq: Two times a day (BID) | ORAL | Status: DC
  Administered 2019-05-28: 60 mg via ORAL
  Filled 2019-05-28 (×3): qty 1

## 2019-05-28 MED ORDER — SOTALOL HCL 120 MG PO TABS
120.0000 mg | ORAL_TABLET | Freq: Two times a day (BID) | ORAL | Status: DC
  Administered 2019-05-28: 09:00:00 120 mg via ORAL
  Filled 2019-05-28 (×3): qty 1

## 2019-05-28 NOTE — Evaluation (Addendum)
Occupational Therapy Evaluation Patient Details Name: Melinda Mckinney MRN: [id number] DOB: February 09, 1956 Today's Date: 05/28/2019    History of Present Illness 63 y.o. female with PMHx of s/p PCI, atrial fibrillation, chronic diastolic heart failure, DM, HTN, stage I cervical CA s/p cryotherapy-who was hospitalized at this facility from 9/15-9/17 after presenting with GI symptoms and fever, she was found to have COVID-19. Increasing GI symptoms, afib with RVR, and incr dyspnea and presented to ED 05/24/19   Clinical Impression   This 63 y/o female presents with the above. PTA pt reports mod independence with ADL and functional mobility using SPC. Pt performing functional transfers this session with minguard assist; she currently requires minA for LB ADL, setup assist for seated UB ADL. Pt slightly impulsive/quick to transition to EOB and stand therefore unable to obtain BP prior to transitions, she reports mild dizziness post transfer with BP 126/81 once seated in recliner, SpO2 >90% on RA. Further discussed safety/strategies to reduce risk for falls at home as pt reports hx of falling (approx 2 in past month). She will benefit from continued acute OT services and recommend [place] services to further address and to maximize her safety and independence with ADL and mobility. Will follow.     Follow Up Recommendations  Home health OT;Supervision/Assistance - 24 hour    Equipment Recommendations  Tub/shower seat           Precautions / Restrictions Precautions Precautions: Other (comment) Precaution Comments: monitor for orthostasis; pt reports approx x2 falls in past month Restrictions Weight Bearing Restrictions: No      Mobility Bed Mobility Overal bed mobility: Modified Independent             General bed mobility comments: supine>sit, HOB elevated  Transfers Overall transfer level: Needs assistance Equipment used: None Transfers: Sit to/from [employer] Transfers Sit to  Stand: Supervision Stand pivot transfers: Min guard       General transfer comment: for safety and lines, no imbalance noted; pt reports mild dizziness with BP taken post transfer and stable     Balance Overall balance assessment: Needs assistance;History of Falls Sitting-balance support: Feet supported Sitting balance-Leahy Scale: Good     Standing balance support: No upper extremity supported;During functional activity Standing balance-Leahy Scale: Fair                             ADL either performed or assessed with clinical judgement   ADL Overall ADL's : Needs assistance/impaired Eating/Feeding: Modified independent;Sitting   Grooming: Set up;Sitting   Upper Body Bathing: Sitting;Set up   Lower Body Bathing: Min guard;Sit to/from stand   Upper Body Dressing : Set up;Sitting   Lower Body Dressing: Min guard;Minimal assistance;Sit to/from stand Lower Body Dressing Details (indicate cue type and reason): able to utilize figure 4 for LLE; unable to for RLE (bending towards RLE) - educated in donning RLE First with LB ADL to increase ease of task completion Toilet Transfer: Min Designer, jewellery Details (indicate cue type and reason): simulated via transfer to [place] and Hygiene: Min guard;Sit to/from stand       Functional mobility during ADLs: Min guard General ADL Comments: pt reports mild dizziness with stand pivot transfers, BP monitored and stable; discussed safety at home and strategies to reduce risk for falls at home     Vision         Perception     Praxis  Pertinent Vitals/Pain Pain Assessment: No/denies pain     Hand Dominance Right   Extremity/Trunk Assessment Upper Extremity Assessment Upper Extremity Assessment: Generalized weakness   Lower Extremity Assessment Lower Extremity Assessment: Defer to PT evaluation   Cervical / Trunk Assessment Cervical / Trunk Assessment:  Other exceptions Cervical / Trunk Exceptions: obese   Communication Communication Communication: Prefers language other than [place];Interpreter utilized([place], [phone])   Cognition Arousal/Alertness: Awake/alert Behavior During Therapy: WFL for tasks assessed/performed Overall Cognitive Status: Within Functional Limits for tasks assessed                                 General Comments: She often speaks over the interpreter, difficult to understand her answers and have to have interpreter repeat; at times performing tasks on her own accord prior to listening to therapist instruction (pt transitioning to EOB and standing prior to therapist having AD ready or having lines prepared)    General Comments  Pt on RA with O2 stable, BP 126/81 post transfer    Exercises     Shoulder Instructions      Home Living Family/patient expects to be discharged to:: Private residence Living Arrangements: Spouse/significant other Available Help at Discharge: Family;Available 24 hours/day Type of Home: House Home Access: Other (comment)(electric chair lift)     Home Layout: Multi-level;Other (Comment)(electric lift for stairs)     Bathroom Shower/Tub: Walk-in shower         Home Equipment: Grab bars - tub/shower;Cane - single point(electric chair for the stairs)          Prior Functioning/Environment Level of Independence: Independent with assistive device(s)        Comments: uses cane        OT Problem List: Decreased strength;Decreased range of motion;Decreased activity tolerance;Impaired balance (sitting and/or standing);Decreased safety awareness;Decreased knowledge of use of DME or AE;Decreased knowledge of precautions;Obesity;Cardiopulmonary status limiting activity      OT Treatment/Interventions: Self-care/ADL training;Therapeutic exercise;Neuromuscular education;Energy conservation;DME and/or AE instruction;Therapeutic activities;Patient/family  education;Balance training    OT Goals(Current goals can be found in the care plan section) Acute Rehab OT Goals Patient Stated Goal: to feel stronger OT Goal Formulation: With patient Time For Goal Achievement: 06/11/19 Potential to Achieve Goals: Good  OT Frequency: Min 3X/week   Barriers to D/C:            Co-evaluation              AM-PAC OT "6 Clicks" Daily Activity     Outcome Measure Help from another person eating meals?: None Help from another person taking care of personal grooming?: A Little Help from another person toileting, which includes using toliet, bedpan, or urinal?: A Little Help from another person bathing (including washing, rinsing, drying)?: A Little Help from another person to put on and taking off regular upper body clothing?: None Help from another person to put on and taking off regular lower body clothing?: A Little 6 Click Score: 20   End of Session Nurse Communication: Mobility status  Activity Tolerance: Patient tolerated treatment well Patient left: in chair;with call bell/phone within reach  OT Visit Diagnosis: History of falling (Z91.81);Muscle weakness (generalized) (M62.81)                Time: [id number] OT Time Calculation (min): 27 min Charges:  OT General Charges $OT Visit: 1 Visit OT Evaluation $OT Eval Moderate Complexity: 1 Mod OT Treatments $Self Care/Home Management : 8-22  mins  Lou Cal, OT [facility] Services Pager [phone] Office [place] 05/28/2019, 10:52 AM

## 2019-05-28 NOTE — Telephone Encounter (Signed)
Schedule in person TCM by  06/04/2019 (10 days after last COVID-19 test). Advised to call before she comes to the office  if she is still running fevers or feeling poorly.  If that is the case we won't be able to see her in the office.

## 2019-05-28 NOTE — TOC Transition Note (Signed)
Transition of Care [facility]) - CM/SW Discharge Note   Patient Details  Name: Melinda Mckinney MRN: [id number] Date of Birth: 1955/09/14  Transition of Care [facility] CM/SW Contact:  Ninfa Meeker, RN Phone Number: [phone] (working remotely)  05/28/2019, 1:10 PM   Clinical Narrative:   Patient is a6 y.o.femalewith PMHx of s/p PCI, atrial fibrillation, chronic diastolic heart failure, DM, HTN, stage I cervical CA s/p cryotherapy-who was recently at [facility] from 9/15-9/17 after presenting with GI symptoms and fever, she was found to have COVID-19. She was managed with supportive care and subsequently discharged home on Decadron. For 6 days prior to this current hospital admission-patient developed nausea, vomiting, upper abdominal pain and exertional dyspnea. Was found to have A. fib with RVR, chest x-ray was positive for multifocal infiltrates-CT of the abdomen was negative for acute abnormalities-patient was started on Cardizem infusion-and subsequently admitted to the hospitalist service. Patient has COVID pneumonia. Thankf[place]ly has inproved and will discharge home. Case manager spoke with patient using a spanish interpreter via telephone to discuss [place] and DME needs. Rerral for [facility] agency was offered, rewferral was called to [place], [place] Liaison. And to Learta Codding for DME.  Case manager contacted [facility], Alma Friend[facility] and requested that a RW and 3in1 be delivered to patient's room. She will discharge home with family.   Final next level of care: [place] Barriers to Discharge: No Barriers Identified   Patient Goals and CMS Choice Patient states their goals for this hospitalization and ongoing recovery are:: get better CMS Medicare.gov Compare Post Acute Care list provided to:: Patient Represenative (must comment)(verbally through interpreter) Choice offered to / list presented to : Patient  Discharge Placement                       Discharge Plan  and Services In-house Referral: NA Discharge Planning Services: CM Consult Post Acute Care Choice: Durable Medical Equipment, Home Health          DME Arranged: 3-N-1, [place] rolling DME Agency: [place] Date DME Agency Contacted: 05/28/19 Time DME Agency Contacted: 1220 Representative spoke with at DME Agency: Learta Codding HH Arranged: PT [place]: [place] ([place]) Date [place]: 05/28/19 Time [place]: 1210 Representative spoke with at [place]: [place] Determinants of Health ([place]) Interventions     Readmission Risk Interventions No flowsheet data found.

## 2019-05-28 NOTE — Discharge Summary (Signed)
Melinda Mckinney [id number] DOB: 1955-10-21 DOA: 05/24/2019  PCP: Colon Branch, MD  Admit date: 05/24/2019  Discharge date: 05/28/2019  Admitted From: Home  Disposition:  Home   Recommendations for Outpatient Follow-up:   Follow up with PCP in 1-2 weeks  PCP Please obtain BMP/CBC, 2 view CXR in 1week,  (see Discharge instructions)   PCP Please follow up on the following pending results: Review CT scan results, check CBC CMP in a week.   Home Health: PT,RN   Equipment/Devices: Rolling walker  Consultations: None  Discharge Condition: Stable    CODE STATUS: Full    Diet Recommendation: Heart Healthy Low Carb  Diet Order            Diet heart healthy/carb modified Room service appropriate? Yes; Fluid consistency: Thin  Diet effective now               Chief Complaint  Patient presents with   Abdominal Pain     Brief history of present illness from the day of admission and additional interim summary    Patient is a 63 y.o. female with PMHx of s/p PCI, atrial fibrillation, chronic diastolic heart failure, DM, HTN, stage I cervical CA s/p cryotherapy-who was hospitalized at this facility from 9/15-9/17 after presenting with GI symptoms and fever, she was found to have COVID-19.  She was managed with supportive care and subsequently discharged home on Decadron.  For 6 days prior to this current hospital admission-patient developed nausea, vomiting, upper abdominal pain and exertional dyspnea.  Was found to have A. fib with RVR, chest x-ray was positive for multifocal infiltrates-CT of the abdomen was negative for acute abnormalities-patient was started on Cardizem infusion-and subsequently admitted to the hospitalist service.                                                                 Hospital Course     PNA with gastroenteritis-COVID-19 initially positive on 9/15: Her main issue seems to be gastroenteritis which is much improved, not hypoxic, chest x-ray and CT scan do show infiltrate which seem to have progressed since last admission. Did have elevated CRP for which she was on steroids and has been tapered off.    Is now completely symptom-free on room air for 3 to 4 days, no diarrhea and no distress whatsoever, will be discharged on her home medications with PCP follow-up in a week.  Respiratory viral panel was -ve along with Legionella urinary antigen .  COVID-19 Labs  Recent Labs    05/26/19 0720 05/27/19 0245 05/28/19 0630  DDIMER 0.78* 0.57* 0.76*  FERRITIN 45 36  --   LDH  --  176  --   CRP 6.2* 2.3*  --     Lab Results  Component Value Date   SARSCOV2NAA  POSITIVE (A) 05/24/2019   SARSCOV2NAA POSITIVE (A) 04/30/2019    Dehydration with AKI.    Solved after gentle IV fluids request PCP to repeat CMP in a week in the outpatient setting.  A. fib with RVR: Continue combination of Cardizem and sotalol -remains on Xarelto.  Chronic systolic heart failure (EF 45-50 on 07/27/2017): Volume status stable-1 dose of Lasix-see above.  HTN: Controlled-continue beta-blocker, Cardizem, hold ACE inhibitor due to AKI.  Hypothyroidism: Continue Synthroid  History of fibromyalgia: Continue Flexeril as needed  Obesity: BMI of 42 follow with PCP for weight loss.  Incidental CT scan finding of abdominal incisional hernia.  Asymptomatic.  PCP to monitor and follow.    DM-2:  Continue home regimen.  Lab Results  Component Value Date   HGBA1C 6.1 (H) 05/01/2019    Discharge diagnosis     Principal Problem:   Pneumonia due to COVID-19 virus Active Problems:   Coronary artery disease involving native coronary artery of native heart with angina pectoris (HCC)   Fibromyalgia   Hypothyroidism   Atrial fibrillation with RVR (HCC)   Chronic systolic CHF (congestive heart  failure) (HCC)   Hypokalemia   Hypomagnesemia    Discharge instructions    Discharge Instructions    Discharge instructions   Complete by: As directed    Follow with Primary MD Colon Branch, MD in 7 days   Get CBC, CMP, 2 view Chest X ray -  checked next visit within 1 week by Primary MD   Activity: As tolerated with Full fall precautions use walker/cane & assistance as needed  Disposition Home   Diet: Heart Healthy - Low Carb  If you have smoked or chewed Tobacco  in the last 2 yrs please stop smoking, stop any regular Alcohol  and or any Recreational drug use.  On your next visit with your primary care physician please Get Medicines reviewed and adjusted.  Please request your Prim.MD to go over all Hospital Tests and Procedure/Radiological results at the follow up, please get all Hospital records sent to your Prim MD by signing hospital release before you go home.  If you experience worsening of your admission symptoms, develop shortness of breath, life threatening emergency, suicidal or homicidal thoughts you must seek medical attention immediately by calling 911 or calling your MD immediately  if symptoms less severe.  You Must read complete instructions/literature along with all the possible adverse reactions/side effects for all the Medicines you take and that have been prescribed to you. Take any new Medicines after you have completely understood and accpet all the possible adverse reactions/side effects.   Increase activity slowly   Complete by: As directed    MyChart COVID-19 home monitoring program   Complete by: May 28, 2019    Is the patient willing to use the [facility] for home monitoring?: Yes   Temperature monitoring   Complete by: May 28, 2019    After how many days would you like to receive a notification of this patient's flowsheet entries?: 1      Discharge Medications   Allergies as of 05/28/2019      Reactions   Penicillins Swelling, Rash    Has patient had a PCN reaction causing immediate rash, facial/tongue/throat swelling, SOB or lightheadedness with hypotension: No Has patient had a PCN reaction causing severe rash involving mucus membranes or skin necrosis: No Has patient had a PCN reaction that required hospitalization: No Has patient had a PCN reaction occurring within the  last 10 years: No If all of the above answers are "NO", then may proceed with Cephalosporin use.      Medication List    TAKE these medications   allopurinol 100 MG tablet Commonly known as: ZYLOPRIM Take 0.5 tablets (50 mg total) by mouth daily. What changed:   how much to take  when to take this   atorvastatin 40 MG tablet Commonly known as: LIPITOR Take 1 tablet (40 mg total) by mouth at bedtime.   cyclobenzaprine 10 MG tablet Commonly known as: FLEXERIL Take 1 tablet (10 mg total) by mouth 2 (two) times daily as needed for muscle spasms.   diltiazem 120 MG tablet Commonly known as: CARDIZEM TAKE 1/2 TABLET BY MOUTH TWICE DAILY   DULoxetine 60 MG capsule Commonly known as: CYMBALTA Take 1 capsule (60 mg total) by mouth daily.   furosemide 20 MG tablet Commonly known as: LASIX Take 1 tablet (20 mg total) by mouth daily.   levothyroxine 125 MCG tablet Commonly known as: SYNTHROID TAKE ONE TABLET BY MOUTH DAILY BEFORE BREAKFAST What changed: See the new instructions.   lisinopril 10 MG tablet Commonly known as: ZESTRIL Take 1 tablet (10 mg total) by mouth daily.   metFORMIN 500 MG tablet Commonly known as: GLUCOPHAGE Take 1 tablet (500 mg total) by mouth 2 (two) times daily with a meal.   omeprazole 20 MG capsule Commonly known as: PRILOSEC Take 1 capsule (20 mg total) by mouth daily. What changed: when to take this   PRESCRIPTION MEDICATION 4 g See admin instructions. Testosterone 2 % cream  APPLY FOUR CLICKS (ONE GRAM) TO INNER THIGH DAILY. ROTATE   raloxifene 60 MG tablet Commonly known as: EVISTA Take 1 tablet  (60 mg total) by mouth daily. What changed: when to take this   rivaroxaban 20 MG Tabs tablet Commonly known as: Xarelto Take 1 tablet (20 mg total) by mouth daily with supper.   SOTALOL AF 120 MG Tabs TAKE ONE TABLET BY MOUTH EVERY 12 HOURS   Vitamin D3 125 MCG (5000 UT) Caps Take 5,000 Units by mouth daily.   zolpidem 5 MG tablet Commonly known as: AMBIEN Take 1 tablet (5 mg total) by mouth at bedtime as needed. for sleep What changed: reasons to take this            Durable Medical Equipment  (From admission, onward)         Start     Ordered   05/28/19 0859  For home use only DME Walker rolling  Once    Comments: 5 wheel  Question:  Patient needs a walker to treat with the following condition  Answer:  Weakness   05/28/19 0859          Follow-up Information    Colon Branch, MD. Schedule an appointment as soon as possible for a visit in 1 week(s).   Specialty: Internal Medicine Contact information: [phone] [place] [address] [phone]        Thompson Grayer, MD .   Specialty: Cardiology Contact information: [address] [place] [address] [phone]           Major procedures and Radiology Reports - PLEASE review detailed and final reports thoroughly  -        Ct Abdomen Pelvis W Contrast  Result Date: 05/24/2019 CLINICAL DATA:  An acute generalized abdominal pain diarrhea EXAM: CT ABDOMEN AND PELVIS WITH CONTRAST TECHNIQUE: Multidetector CT  imaging of the abdomen and pelvis was performed using the standard protocol following bolus administration of intravenous contrast. CONTRAST:  33mL OMNIPAQUE IOHEXOL 300 MG/ML  SOLN COMPARISON:  CT 07/30/2012 FINDINGS: Lower chest: Multifocal airspace consolidation and surrounding ground-glass opacity in both lungs with bilateral effusions. Bandlike opacities likely reflect associated atelectasis and/or scarring. Thickening and fluid-filled distal airways  are noted. Cardiomegaly with biatrial enlargement. No pericardial effusion. Atherosclerotic calcification of the coronary arteries. Hepatobiliary: No focal liver abnormality is seen. No gallstones, gallbladder wall thickening, or biliary dilatation. Pancreas: Unremarkable. No pancreatic ductal dilatation or surrounding inflammatory changes. Spleen: Few punctate calcifications present within the spleen. Spleen otherwise unremarkable. Adrenals/Urinary Tract: Normal adrenal glands. Surgical absence of the right kidney. No abnormal soft tissue in the nephrectomy bed. Mild lobulation of the left kidney without concerning renal mass, calculus or hydronephrosis. No urinary tract dilatation. Urinary bladder is unremarkable. Stomach/Bowel: Distal esophagus, stomach and duodenal sweep are unremarkable. No small bowel wall thickening or dilatation. Interposition of several small bowel loops and portion of the colon anterior to the liver. No resulting obstruction. A normal appendix is visualized. No colonic dilatation or wall thickening. Vascular/Lymphatic: Atherosclerotic plaque within the normal caliber aorta. No suspicious or enlarged lymph nodes in the included lymphatic chains. Reproductive: Normal appearance of the uterus and adnexal structures. Postsurgical changes from prior Caesarean section. Other: Postsurgical changes of the low anterior abdomen, likely related prior Caesarean section within enlarging incisional hernia involving the right lateral aspect of the incisional line. Herniated portion of the fat now measures approximately 13.4 x 3.3 cm in size. Hernia defect measures approximately 3.5 by 1.4 cm transverse by craniocaudal. No abnormal stranding within the herniated fat. No bowel containing hernia. No free fluid or free air within the abdomen or pelvis. Musculoskeletal: Mild degenerative changes in the spine most pronounced at L5-S1. No acute osseous abnormality or suspicious osseous lesion. IMPRESSION: 1.  Multifocal airspace consolidation and surrounding ground-glass opacity in both lungs with bilateral effusions, concerning for multifocal pneumonia including possible atypical viral etiologies such as COVID-19. 2. No acute intra-abdominal process. 3. Postsurgical changes from prior Caesarean section within enlarging fat containing incisional hernia involving the right lateral aspect of the incisional line. No abnormal stranding within the herniated fat. 4. Aortic Atherosclerosis (ICD10-I70.0). Electronically Signed   By: Lovena Le M.D.   On: 05/24/2019 22:41   Dg Chest Portable 1 View  Result Date: 05/24/2019 CLINICAL DATA:  Shortness of breath, abdominal pain and diarrhea EXAM: PORTABLE CHEST 1 VIEW COMPARISON:  Radiograph 04/30/2019. Same-day CT abdomen pelvis. FINDINGS: Cardiomegaly, as seen on CT. Increased from comparison studies. Widespread bilateral multifocal airspace opacities throughout both lungs with associated volume loss. Partial obscuration of the hemidiaphragm suggest the pleural effusions also seen on CT. No pneumothorax. No acute osseous or soft tissue abnormality. IMPRESSION: 1. Widespread bilateral multifocal airspace opacities with associated volume loss, consistent with multifocal pneumonia including potential atypical viral etiologies such as COVID-19. 2. New cardiomegaly. Electronically Signed   By: Lovena Le M.D.   On: 05/24/2019 22:44   Dg Chest Port 1 View  Result Date: 04/30/2019 CLINICAL DATA:  Fever and body ache EXAM: PORTABLE CHEST 1 VIEW COMPARISON:  07/27/2017 FINDINGS: Mild cardiomegaly. Similar appearance of diffuse increased interstitial opacity, therefore felt chronic. No acute focal airspace disease or effusion. No pneumothorax. IMPRESSION: No active disease. Stable cardiomegaly and diffuse interstitial prominence. Electronically Signed   By: Donavan Foil M.D.   On: 04/30/2019 17:49    Micro Results  Recent Results (from the past 240 hour(s))  SARS  Coronavirus 2 by RT PCR (hospital order, performed in [facility] hospital lab) Nasopharyngeal Nasopharyngeal Swab     Status: Abnormal   Collection Time: 05/24/19  8:28 PM   Specimen: Nasopharyngeal Swab  Result Value Ref Range Status   SARS Coronavirus 2 POSITIVE (A) NEGATIVE Final    Comment: RESULT CALLED TO, READ BACK BY AND VERIFIED WITH: NEAL,K AT 2134 ON [id number] BY CHERESNOWSKY,T (NOTE) If result is NEGATIVE SARS-CoV-2 target nucleic acids are NOT DETECTED. The SARS-CoV-2 RNA is generally detectable in upper and lower  respiratory specimens during the acute phase of infection. The lowest  concentration of SARS-CoV-2 viral copies this assay can detect is 250  copies / mL. A negative result does not preclude SARS-CoV-2 infection  and should not be used as the sole basis for treatment or other  patient management decisions.  A negative result may occur with  improper specimen collection / handling, submission of specimen other  than nasopharyngeal swab, presence of viral mutation(s) within the  areas targeted by this assay, and inadequate number of viral copies  (<250 copies / mL). A negative result must be combined with clinical  observations, patient history, and epidemiological information. If result is POSITIVE SARS-CoV-2 target nucleic acids are DETEC TED. The SARS-CoV-2 RNA is generally detectable in upper and lower  respiratory specimens during the acute phase of infection.  Positive  results are indicative of active infection with SARS-CoV-2.  Clinical  correlation with patient history and other diagnostic information is  necessary to determine patient infection status.  Positive results do  not rule out bacterial infection or co-infection with other viruses. If result is PRESUMPTIVE POSTIVE SARS-CoV-2 nucleic acids MAY BE PRESENT.   A presumptive positive result was obtained on the submitted specimen  and confirmed on repeat testing.  While 2019 novel coronavirus    (SARS-CoV-2) nucleic acids may be present in the submitted sample  additional confirmatory testing may be necessary for epidemiological  and / or clinical management purposes  to differentiate between  SARS-CoV-2 and other Sarbecovirus currently known to infect humans.  If clinically indicated additional testing with an alternate test  methodology ([id number] [id number]) is advised. The SARS-CoV-2 RNA is generally  detectable in upper and lower respiratory specimens during the acute  phase of infection. The expected result is Negative. Fact Sheet for Patients:  StrictlyIdeas.no Fact Sheet for Healthcare Providers: BankingDealers.co.za This test is not yet approved or cleared by the [place] FDA and has been authorized for detection and/or diagnosis of SARS-CoV-2 by FDA under an Emergency Use Authorization (EUA).  This EUA will remain in effect (meaning this test can be used) for the duration of the COVID-19 declaration under Section 564(b)(1) of the Act, 21 U.S.C. section 360bbb-3(b)(1), unless the authorization is terminated or revoked sooner. Performed at [facility], [address] [place], [place] [address]   Culture, blood (routine x 2)     Status: None (Preliminary result)   Collection Time: 05/24/19  8:37 PM   Specimen: BLOOD  Result Value Ref Range Status   Specimen Description   Final    BLOOD LEFT ANTECUBITAL Performed at [facility], [address] [place], [place] [address]    Special Requests   Final    BOTTLES DRAWN AEROBIC AND ANAEROBIC Blood Culture results may not be optimal due to an inadequate volume of blood received in culture bottles Performed at [facility]  [address], [address], [place], [place] [address]    Culture   Final    NO GROWTH 3 DAYS Performed at [facility], [place] [address] [place], [place] [address]    Report Status PENDING  Incomplete  Culture, blood (routine x  2)     Status: None (Preliminary result)   Collection Time: 05/24/19  8:53 PM   Specimen: BLOOD  Result Value Ref Range Status   Specimen Description   Final    BLOOD RIGHT ANTECUBITAL Performed at [facility], [address] [place], [place] [address]    Special Requests   Final    BOTTLES DRAWN AEROBIC AND ANAEROBIC Blood Culture adequate volume Performed at [facility], [address] [place], [place] [address]    Culture   Final    NO GROWTH 3 DAYS Performed at [facility], [place] [address] [place], [place] [address]    Report Status PENDING  Incomplete  Respiratory Panel by PCR     Status: None   Collection Time: 05/26/19  6:27 AM  Result Value Ref Range Status   Adenovirus NOT DETECTED NOT DETECTED Final   Coronavirus 229E NOT DETECTED NOT DETECTED Final    Comment: (NOTE) The Coronavirus on the Respiratory Panel, DOES NOT test for the novel  Coronavirus (2019 nCoV)    Coronavirus HKU1 NOT DETECTED NOT DETECTED Final   Coronavirus NL63 NOT DETECTED NOT DETECTED Final   Coronavirus OC43 NOT DETECTED NOT DETECTED Final   Metapneumovirus NOT DETECTED NOT DETECTED Final   Rhinovirus / Enterovirus NOT DETECTED NOT DETECTED Final   Influenza A NOT DETECTED NOT DETECTED Final   Influenza B NOT DETECTED NOT DETECTED Final   Parainfluenza Virus 1 NOT DETECTED NOT DETECTED Final   Parainfluenza Virus 2 NOT DETECTED NOT DETECTED Final   Parainfluenza Virus 3 NOT DETECTED NOT DETECTED Final   Parainfluenza Virus 4 NOT DETECTED NOT DETECTED Final   Respiratory Syncytial Virus NOT DETECTED NOT DETECTED Final   Bordetella pertussis NOT DETECTED NOT DETECTED Final   Chlamydophila pneumoniae NOT DETECTED NOT DETECTED Final   Mycoplasma pneumoniae NOT DETECTED NOT DETECTED Final    Comment: Performed at [facility], [address], [place], [place] [address]    Today   Subjective    Melinda Mckinney today has no headache,no chest abdominal  pain,no new weakness tingling or numbness, feels much better wants to go home today.     Objective   Blood pressure (!) 143/92, pulse 74, temperature 97.6 F (36.4 C), temperature source Oral, resp. rate 18, height 5\' 5"  (1.651 m), weight 117 kg, SpO2 94 %.   Intake/Output Summary (Last 24 hours) at 05/28/2019 0859 Last data filed at 05/27/2019 1935 Gross per 24 hour  Intake 1263.96 ml  Output 650 ml  Net 613.96 ml    Exam Awake Alert,   No new F.N deficits, Normal affect .AT,PERRAL Supple Neck,No JVD, No cervical lymphadenopathy appriciated.  Symmetrical Chest wall movement, Good air movement bilaterally, CTAB RRR,No Gallops,Rubs or new Murmurs, No Parasternal Heave +ve B.Sounds, Abd Soft, Non tender, No organomegaly appriciated, No rebound -guarding or rigidity. No Cyanosis, Clubbing or edema, No new Rash or bruise   Data Review   CBC w Diff:  Lab Results  Component Value Date   WBC 8.2 05/28/2019   HGB 10.1 (L) 05/28/2019   HGB 12.3 09/30/2008   HCT 33.0 (L) 05/28/2019   HCT  36.5 09/30/2008   PLT 325 05/28/2019   PLT 299 09/30/2008   LYMPHOPCT 14 05/28/2019   LYMPHOPCT 9.3 (L) 09/30/2008   MONOPCT 7 05/28/2019   MONOPCT 2.4 09/30/2008   EOSPCT 0 05/28/2019   EOSPCT 0.2 09/30/2008   BASOPCT 0 05/28/2019   BASOPCT 0.2 09/30/2008    CMP:  Lab Results  Component Value Date   NA 137 05/27/2019   K 4.7 05/27/2019   CL 106 05/27/2019   CO2 22 05/27/2019   BUN 36 (H) 05/27/2019   CREATININE 1.13 (H) 05/27/2019   PROT 6.3 (L) 05/27/2019   ALBUMIN 2.9 (L) 05/27/2019   BILITOT 0.4 05/27/2019   ALKPHOS 100 05/27/2019   AST 20 05/27/2019   ALT 17 05/27/2019  .   Total Time in preparing paper work, data evaluation and todays exam - 37 minutes  Lala Lund M.D on 05/28/2019 at [facility]  [phone]

## 2019-05-28 NOTE — Discharge Instructions (Signed)
Follow with Primary MD Colon Branch, MD in 7 days   Get CBC, CMP, 2 view Chest X ray -  checked next visit within 1 week by Primary MD   Activity: As tolerated with Full fall precautions use walker/cane & assistance as needed  Disposition Home   Diet: Heart Healthy - Low Carb  If you have smoked or chewed Tobacco  in the last 2 yrs please stop smoking, stop any regular Alcohol  and or any Recreational drug use.  On your next visit with your primary care physician please Get Medicines reviewed and adjusted.  Please request your Prim.MD to go over all Hospital Tests and Procedure/Radiological results at the follow up, please get all Hospital records sent to your Prim MD by signing hospital release before you go home.  If you experience worsening of your admission symptoms, develop shortness of breath, life threatening emergency, suicidal or homicidal thoughts you must seek medical attention immediately by calling 911 or calling your MD immediately  if symptoms less severe.  You Must read complete instructions/literature along with all the possible adverse reactions/side effects for all the Medicines you take and that have been prescribed to you. Take any new Medicines after you have completely understood and accpet all the possible adverse reactions/side effects.       Person Under Monitoring Name: Melinda Mckinney  Location: [id number] [place] [address]   Infection Prevention Recommendations for Individuals Confirmed to have, or Being Evaluated for, 2019 Novel Coronavirus (COVID-19) Infection Who Receive Care at Home  Individuals who are confirmed to have, or are being evaluated for, COVID-19 should follow the prevention steps below until a healthcare provider or local or state health department says they can return to normal activities.  Stay home except to get medical care You should restrict activities outside your home, except for getting medical care. Do not go to  work, school, or public areas, and do not use public transportation or taxis.  Call ahead before visiting your doctor Before your medical appointment, call the healthcare provider and tell them that you have, or are being evaluated for, COVID-19 infection. This will help the healthcare providers office take steps to keep other people from getting infected. Ask your healthcare provider to call the local or state health department.  Monitor your symptoms Seek prompt medical attention if your illness is worsening (e.g., difficulty breathing). Before going to your medical appointment, call the healthcare provider and tell them that you have, or are being evaluated for, COVID-19 infection. Ask your healthcare provider to call the local or state health department.  Wear a facemask You should wear a facemask that covers your nose and mouth when you are in the same room with other people and when you visit a healthcare provider. People who live with or visit you should also wear a facemask while they are in the same room with you.  Separate yourself from other people in your home As much as possible, you should stay in a different room from other people in your home. Also, you should use a separate bathroom, if available.  Avoid sharing household items You should not share dishes, drinking glasses, cups, eating utensils, towels, bedding, or other items with other people in your home. After using these items, you should wash them thoroughly with soap and water.  Cover your coughs and sneezes Cover your mouth and nose with a tissue when you cough or sneeze, or you can cough or sneeze  into your sleeve. Throw used tissues in a lined trash can, and immediately wash your hands with soap and water for at least 20 seconds or use an alcohol-based hand rub.  Wash your [facility] your hands often and thoroughly with soap and water for at least 20 seconds. You can use an alcohol-based hand sanitizer if  soap and water are not available and if your hands are not visibly dirty. Avoid touching your eyes, nose, and mouth with unwashed hands.   Prevention Steps for Caregivers and Household Members of Individuals Confirmed to have, or Being Evaluated for, COVID-19 Infection Being Cared for in the Home  If you live with, or provide care at home for, a person confirmed to have, or being evaluated for, COVID-19 infection please follow these guidelines to prevent infection:  Follow healthcare providers instructions Make sure that you understand and can help the patient follow any healthcare provider instructions for all care.  Provide for the patients basic needs You should help the patient with basic needs in the home and provide support for getting groceries, prescriptions, and other personal needs.  Monitor the patients symptoms If they are getting sicker, call his or her medical provider and tell them that the patient has, or is being evaluated for, COVID-19 infection. This will help the healthcare providers office take steps to keep other people from getting infected. Ask the healthcare provider to call the local or state health department.  Limit the number of people who have contact with the patient  If possible, have only one caregiver for the patient.  Other household members should stay in another home or place of residence. If this is not possible, they should stay  in another room, or be separated from the patient as much as possible. Use a separate bathroom, if available.  Restrict visitors who do not have an essential need to be in the home.  Keep older adults, very young children, and other sick people away from the patient Keep older adults, very young children, and those who have compromised immune systems or chronic health conditions away from the patient. This includes people with chronic heart, lung, or kidney conditions, diabetes, and cancer.  Ensure good  ventilation Make sure that shared spaces in the home have good air flow, such as from an air conditioner or an opened window, weather permitting.  Wash your hands often  Wash your hands often and thoroughly with soap and water for at least 20 seconds. You can use an alcohol based hand sanitizer if soap and water are not available and if your hands are not visibly dirty.  Avoid touching your eyes, nose, and mouth with unwashed hands.  Use disposable paper towels to dry your hands. If not available, use dedicated cloth towels and replace them when they become wet.  Wear a facemask and gloves  Wear a disposable facemask at all times in the room and gloves when you touch or have contact with the patients blood, body fluids, and/or secretions or excretions, such as sweat, saliva, sputum, nasal mucus, vomit, urine, or feces.  Ensure the mask fits over your nose and mouth tightly, and do not touch it during use.  Throw out disposable facemasks and gloves after using them. Do not reuse.  Wash your hands immediately after removing your facemask and gloves.  If your personal clothing becomes contaminated, carefully remove clothing and launder. Wash your hands after handling contaminated clothing.  Place all used disposable facemasks, gloves, and other waste in  a lined container before disposing them with other household waste.  Remove gloves and wash your hands immediately after handling these items.  Do not share dishes, glasses, or other household items with the patient  Avoid sharing household items. You should not share dishes, drinking glasses, cups, eating utensils, towels, bedding, or other items with a patient who is confirmed to have, or being evaluated for, COVID-19 infection.  After the person uses these items, you should wash them thoroughly with soap and water.  Wash laundry thoroughly  Immediately remove and wash clothes or bedding that have blood, body fluids, and/or  secretions or excretions, such as sweat, saliva, sputum, nasal mucus, vomit, urine, or feces, on them.  Wear gloves when handling laundry from the patient.  Read and follow directions on labels of laundry or clothing items and detergent. In general, wash and dry with the warmest temperatures recommended on the label.  Clean all areas the individual has used often  Clean all touchable surfaces, such as counters, tabletops, doorknobs, bathroom fixtures, toilets, phones, keyboards, tablets, and bedside tables, every day. Also, clean any surfaces that may have blood, body fluids, and/or secretions or excretions on them.  Wear gloves when cleaning surfaces the patient has come in contact with.  Use a diluted bleach solution (e.g., dilute bleach with 1 part bleach and 10 parts water) or a household disinfectant with a label that says EPA-registered for coronaviruses. To make a bleach solution at home, add 1 tablespoon of bleach to 1 quart (4 cups) of water. For a larger supply, add  cup of bleach to 1 gallon (16 cups) of water.  Read labels of cleaning products and follow recommendations provided on product labels. Labels contain instructions for safe and effective use of the cleaning product including precautions you should take when applying the product, such as wearing gloves or eye protection and making sure you have good ventilation during use of the product.  Remove gloves and wash hands immediately after cleaning.  Monitor yourself for signs and symptoms of illness Caregivers and household members are considered close contacts, should monitor their health, and will be asked to limit movement outside of the home to the extent possible. Follow the monitoring steps for close contacts listed on the symptom monitoring form.   ? If you have additional questions, contact your local health department or call the epidemiologist on call at [phone] (available 24/7). ? This guidance is subject  to change. For the most up-to-date guidance from [facility], please refer to their website: [email or web address]

## 2019-05-30 ENCOUNTER — Telehealth: Payer: Self-pay | Admitting: Internal Medicine

## 2019-05-30 LAB — CULTURE, BLOOD (ROUTINE X 2)
Culture: NO GROWTH
Culture: NO GROWTH
Special Requests: ADEQUATE

## 2019-05-30 NOTE — Telephone Encounter (Signed)
Pt spouse dropped off document to be filled out by provider (FMLA -4 pages) Pt would like document to be faxed to [phone] when ready. Document put at front office tray under providers name.

## 2019-05-30 NOTE — Telephone Encounter (Signed)
Pt + for COVID-19- forms placed in plastic sleeve and placed in PCP red folder for completion.

## 2019-06-03 ENCOUNTER — Telehealth: Payer: Self-pay | Admitting: Internal Medicine

## 2019-06-03 NOTE — Telephone Encounter (Signed)
Okay, will fill that form.  Please remind patient she has an appointment with me tomorrow, a virtual one, not in person.   Will schedule blood work/chest x-ray after this virtual visit tomorrow

## 2019-06-03 NOTE — Telephone Encounter (Signed)
Patient's daughter Patriciajo Wolden is going to fax a form for a request a wheel chair. The company is also request the most recent OV notes. Please advise.  [place]

## 2019-06-03 NOTE — Telephone Encounter (Signed)
Will complete at the time of the hospital follow-up virtual visit tomorrow

## 2019-06-03 NOTE — Telephone Encounter (Signed)
Please advise 

## 2019-06-04 ENCOUNTER — Ambulatory Visit (INDEPENDENT_AMBULATORY_CARE_PROVIDER_SITE_OTHER): Payer: Medicare Other | Admitting: Internal Medicine

## 2019-06-04 ENCOUNTER — Other Ambulatory Visit: Payer: Self-pay

## 2019-06-04 DIAGNOSIS — E039 Hypothyroidism, unspecified: Secondary | ICD-10-CM | POA: Diagnosis not present

## 2019-06-04 DIAGNOSIS — K219 Gastro-esophageal reflux disease without esophagitis: Secondary | ICD-10-CM | POA: Diagnosis not present

## 2019-06-04 DIAGNOSIS — J189 Pneumonia, unspecified organism: Secondary | ICD-10-CM | POA: Diagnosis not present

## 2019-06-04 DIAGNOSIS — R0609 Other forms of dyspnea: Secondary | ICD-10-CM

## 2019-06-04 DIAGNOSIS — C539 Malignant neoplasm of cervix uteri, unspecified: Secondary | ICD-10-CM | POA: Diagnosis not present

## 2019-06-04 DIAGNOSIS — E876 Hypokalemia: Secondary | ICD-10-CM | POA: Diagnosis not present

## 2019-06-04 DIAGNOSIS — R531 Weakness: Secondary | ICD-10-CM | POA: Diagnosis not present

## 2019-06-04 DIAGNOSIS — G4700 Insomnia, unspecified: Secondary | ICD-10-CM | POA: Diagnosis not present

## 2019-06-04 DIAGNOSIS — I5032 Chronic diastolic (congestive) heart failure: Secondary | ICD-10-CM | POA: Diagnosis not present

## 2019-06-04 DIAGNOSIS — K529 Noninfective gastroenteritis and colitis, unspecified: Secondary | ICD-10-CM | POA: Diagnosis not present

## 2019-06-04 DIAGNOSIS — M797 Fibromyalgia: Secondary | ICD-10-CM | POA: Diagnosis not present

## 2019-06-04 DIAGNOSIS — A0839 Other viral enteritis: Secondary | ICD-10-CM | POA: Diagnosis not present

## 2019-06-04 DIAGNOSIS — D630 Anemia in neoplastic disease: Secondary | ICD-10-CM | POA: Diagnosis not present

## 2019-06-04 DIAGNOSIS — U071 COVID-19: Secondary | ICD-10-CM | POA: Diagnosis not present

## 2019-06-04 DIAGNOSIS — Z7901 Long term (current) use of anticoagulants: Secondary | ICD-10-CM | POA: Diagnosis not present

## 2019-06-04 DIAGNOSIS — R0600 Dyspnea, unspecified: Secondary | ICD-10-CM | POA: Diagnosis not present

## 2019-06-04 DIAGNOSIS — Z09 Encounter for follow-up examination after completed treatment for conditions other than malignant neoplasm: Secondary | ICD-10-CM | POA: Diagnosis not present

## 2019-06-04 DIAGNOSIS — I110 Hypertensive heart disease with heart failure: Secondary | ICD-10-CM | POA: Diagnosis not present

## 2019-06-04 DIAGNOSIS — F419 Anxiety disorder, unspecified: Secondary | ICD-10-CM | POA: Diagnosis not present

## 2019-06-04 DIAGNOSIS — E559 Vitamin D deficiency, unspecified: Secondary | ICD-10-CM | POA: Diagnosis not present

## 2019-06-04 DIAGNOSIS — J1289 Other viral pneumonia: Secondary | ICD-10-CM | POA: Diagnosis not present

## 2019-06-04 DIAGNOSIS — E119 Type 2 diabetes mellitus without complications: Secondary | ICD-10-CM | POA: Diagnosis not present

## 2019-06-04 DIAGNOSIS — I4891 Unspecified atrial fibrillation: Secondary | ICD-10-CM | POA: Diagnosis not present

## 2019-06-04 DIAGNOSIS — E669 Obesity, unspecified: Secondary | ICD-10-CM | POA: Diagnosis not present

## 2019-06-04 DIAGNOSIS — F329 Major depressive disorder, single episode, unspecified: Secondary | ICD-10-CM | POA: Diagnosis not present

## 2019-06-04 DIAGNOSIS — M5136 Other intervertebral disc degeneration, lumbar region: Secondary | ICD-10-CM | POA: Diagnosis not present

## 2019-06-04 DIAGNOSIS — M810 Age-related osteoporosis without current pathological fracture: Secondary | ICD-10-CM | POA: Diagnosis not present

## 2019-06-04 DIAGNOSIS — J45909 Unspecified asthma, uncomplicated: Secondary | ICD-10-CM | POA: Diagnosis not present

## 2019-06-04 DIAGNOSIS — G8929 Other chronic pain: Secondary | ICD-10-CM | POA: Diagnosis not present

## 2019-06-04 DIAGNOSIS — E7800 Pure hypercholesterolemia, unspecified: Secondary | ICD-10-CM | POA: Diagnosis not present

## 2019-06-04 DIAGNOSIS — I25119 Atherosclerotic heart disease of native coronary artery with unspecified angina pectoris: Secondary | ICD-10-CM | POA: Diagnosis not present

## 2019-06-04 NOTE — Telephone Encounter (Signed)
Form for wheelchair not received yet.

## 2019-06-04 NOTE — Progress Notes (Addendum)
Subjective:    Patient ID: Melinda Mckinney, female    DOB: February 17, 1956, 63 y.o.   MRN: [id number]  DOS:  06/04/2019 Type of visit - description: Attempted  to make this a video visit, due to technical difficulties from the patient side it was not possible  thus we proceeded with a Virtual Visit via Telephone    I connected with@   by telephone and verified that I am speaking with the correct person using two identifiers.  THIS ENCOUNTER IS A VIRTUAL VISIT DUE TO COVID-19 - PATIENT WAS NOT SEEN IN THE OFFICE. PATIENT HAS CONSENTED TO VIRTUAL VISIT / TELEMEDICINE VISIT   Location of patient: home  Location of provider: office  I discussed the limitations, risks, security and privacy concerns of performing an evaluation and management service by telephone and the availability of in person appointments. I also discussed with the patient that there may be a patient responsible charge related to this service. The patient expressed understanding and agreed to proceed.   History of Present Illness:   Hospital follow-up Admitted to hospital 05/24/2019, discharged 05/28/2019. She presented with nausea, vomiting, upper abdominal pain and DOE. History of atrial fibrillation, initially on RVR Chest x-ray with multifocal infiltrates. CT abdomen negative for acute changes. She tested COVID-19 positive during the previous admission last month, positive again during this admission. Respiratory pulmonary panel and  urinary Legionella negative.  AKI, got IV fluids gently due to CHF. Upon discharge, she was noted to be stable and despite pneumonia seen on x-ray and CT she had no symptoms or hypoxia.   Review of Systems I spoke with the patient today, she is currently at home under the care of her husband. She is feeling better. She checked her temperature: No fever. Still has occasional sweats. Appetite is slightly improved. She is drinking plenty of fluids. Denies nausea, vomiting, diarrhea or blood  in the stools.  No abdominal pain. Continue with DOE when she moves around her house but no chest pain or shortness of breath at rest. No edema    Past Medical History:  Diagnosis Date  . Abnormal Pap smear of cervix   . Anemia   . Anxiety and depression   . Asthma   . Atrial fibrillation ([place]) 07/2017  . Borderline diabetes   . CAD (coronary artery disease) ~2009   stent   . Cervical cancer ([place])    "stage 1; had cryotherapy"  . Cervical dysplasia   . Chronic lower back pain   . DDD (degenerative disc disease), lumbar    Dr. Nelva Bush, recommends lumbar epidural steroid injections L5-S1 to the right  . Depression   . Fibromyalgia   . GERD (gastroesophageal reflux disease)   . Headache    "only when I'm short of breath"  (07/27/2017)  . High cholesterol   . History of hiatal hernia   . History of kidney stones   . Hypertension   . Hypothyroidism   . Insomnia 09/27/2013  . Lichen sclerosus    Vulvar area  . Osteoporosis   . Thyroid disease   . Vitamin D deficiency     Past Surgical History:  Procedure Laterality Date  . CARDIAC CATHETERIZATION  ~ 2008  . CARDIOVERSION N/A 08/05/2017   Procedure: CARDIOVERSION;  Surgeon: Evans Lance, MD;  Location: [facility];  Service: Cardiovascular;  Laterality: N/A;  . CARDIOVERSION N/A 08/24/2017   Procedure: CARDIOVERSION;  Surgeon: Sanda Klein, MD;  Location: [facility];  Service: Cardiovascular;  Laterality: N/A;  .  [place]; ~ 1978/1979; 1984  . ESOPHAGOMYOTOMY  2009  . GYNECOLOGIC CRYOSURGERY  X 2   "stage 1 cancer"  . HERNIA REPAIR  X 6   "all in my stomach" (07/27/2017)  . KNEE ARTHROSCOPY Right   . NEPHRECTOMY Right 1991   in [place], due to fibrosis and lithiasis  . TUBAL LIGATION      Social History   Socioeconomic History  . Marital status: Married    Spouse name: Not on file  . Number of children: 3  . Years of education: Not on file  . Highest education level: Not on file  Occupational  History  . 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 Financial resource strain: Not on file  . Food insecurity    Worry: Not on file    Inability: Not on file  . Transportation needs    Medical: Not on file    Non-medical: Not on file  Tobacco Use  . Smoking status: Former Smoker    Packs/day: 2.00    Years: 28.00    Pack years: 56.00    Types: Cigarettes    Quit date: 2000    Years since quitting: 20.8  . Smokeless tobacco: Never Used  Substance and Sexual Activity  . Alcohol use: Yes    Comment: 07/27/2017 "3-4 drinks//month"  . Drug use: No  . Sexual activity: Not Currently    Comment: 1ST INTERCOURSE- 16, PARTNERS - 2  Lifestyle  . Physical activity    Days per week: Not on file    Minutes per session: Not on file  . Stress: Not on file  Relationships  . Social Herbalist on phone: Not on file    Gets together: Not on file    Attends religious service: Not on file    Active member of club or organization: Not on file    Attends meetings of clubs or organizations: Not on file    Relationship status: Not on file  . Intimate partner violence    Fear of current or ex partner: Not on file    Emotionally abused: Not on file    Physically abused: Not on file    Forced sexual activity: Not on file  Other Topics Concern  . Not on file  Social History Narrative   Original from [place]   3 children, 2 alive   Household --pt and husband       Allergies as of 06/04/2019      Reactions   Penicillins Swelling, Rash   Has patient had a PCN reaction causing immediate rash, facial/tongue/throat swelling, SOB or lightheadedness with hypotension: No Has patient had a PCN reaction causing severe rash involving mucus membranes or skin necrosis: No Has patient had a PCN reaction that required hospitalization: No Has patient had a PCN reaction occurring within the last 10 years: No If all of the above answers are "NO", then may proceed with Cephalosporin use.       Medication List       Accurate as of June 04, 2019 11:30 AM. If you have any questions, ask your nurse or doctor.        allopurinol 100 MG tablet Commonly known as: ZYLOPRIM Take 0.5 tablets (50 mg total) by mouth daily. What changed:   how much to take  when to take this   atorvastatin 40 MG tablet Commonly known as: LIPITOR Take 1 tablet (40 mg total) by mouth  at bedtime.   cyclobenzaprine 10 MG tablet Commonly known as: FLEXERIL Take 1 tablet (10 mg total) by mouth 2 (two) times daily as needed for muscle spasms.   diltiazem 120 MG tablet Commonly known as: CARDIZEM TAKE 1/2 TABLET BY MOUTH TWICE DAILY   DULoxetine 60 MG capsule Commonly known as: CYMBALTA Take 1 capsule (60 mg total) by mouth daily.   furosemide 20 MG tablet Commonly known as: LASIX Take 1 tablet (20 mg total) by mouth daily.   levothyroxine 125 MCG tablet Commonly known as: SYNTHROID TAKE ONE TABLET BY MOUTH DAILY BEFORE BREAKFAST What changed: See the new instructions.   lisinopril 10 MG tablet Commonly known as: ZESTRIL Take 1 tablet (10 mg total) by mouth daily.   metFORMIN 500 MG tablet Commonly known as: GLUCOPHAGE Take 1 tablet (500 mg total) by mouth 2 (two) times daily with a meal.   omeprazole 20 MG capsule Commonly known as: PRILOSEC Take 1 capsule (20 mg total) by mouth daily. What changed: when to take this   PRESCRIPTION MEDICATION 4 g See admin instructions. Testosterone 2 % cream  APPLY FOUR CLICKS (ONE GRAM) TO INNER THIGH DAILY. ROTATE   raloxifene 60 MG tablet Commonly known as: EVISTA Take 1 tablet (60 mg total) by mouth daily. What changed: when to take this   rivaroxaban 20 MG Tabs tablet Commonly known as: Xarelto Take 1 tablet (20 mg total) by mouth daily with supper.   SOTALOL AF 120 MG Tabs TAKE ONE TABLET BY MOUTH EVERY 12 HOURS   Vitamin D3 125 MCG (5000 UT) Caps Take 5,000 Units by mouth daily.   zolpidem 5 MG tablet Commonly known as:  AMBIEN Take 1 tablet (5 mg total) by mouth at bedtime as needed. for sleep What changed: reasons to take this           Objective:   Physical Exam There were no vitals taken for this visit. This is a virtual phone office visit, she sounded well, alert oriented x3, no obvious respiratory distress, speaking in complete sentences, no cough noted.    Assessment    Assessment   Hyperglycemia HTN High cholesterol Hypothyroidism Anxiety depression, insomnia CV: --Atrial fibrillation DX 06-2017, s/p cardioversions, intolerant to metoprolol (?) --CAD: stent ~ 2009 Morbid obesity: BMI 42 (2011) Vitamin D deficiency, saw Dr. Cruzita Lederer 07-2016, rtc 1 year, celiac dz test (-), unlikely  Malabsortion; cont supplements  GERD H/o achalasia, s/p [place] 2009 SINGLE KIDNEY---S/p R  Nephrectomy ([place] 1991 due to stones/fibrosis) MSK: --Fibromyalgia --DJD  Knee pain R --Back pain -- s/p epidural 10-16 Osteoporosis: Used to be managed by gynecology, now PCP. 08-2014 T score -2.8, 09-2015: T score -2.7.- Was Rx Evista 09-2014  h/o cervical dysplasia Derm: vulvar pruritus, temovate prn ok per gyn  PLAN: Hospital follow-up, gastroenteritis, multifocal pneumonia: Status post admission for gastroenteritis and multifocal pneumonia, also AKI. She was hydrated, initially got antibiotics but later on they were discontinued.  They noted that this time the infiltrates on imaging she had no resting hypoxia, fever or cough. At this point she does not have fever, GI symptoms resolve, still has DOE but no shortness of breath at rest. Plan:  Nurse visit: Vital signs, O2 sat, CMP, CBC, chest x-ray.  Will call and schedule Continue her normal medications. Recommend to get a pulse oximeter, O2 sat should be more than 94%. Paperwork for her husband completed. Wheelchair: The patient suffers from a recent gastroenteritis, multifocal pneumonia, COVID-19, prolonged bedrest, generalized weakness, DOE,  which  impairs their ability to perform daily activities like moving around the house, going to medical appointments, going shopping.  A walker/walker/crutches will not resolve the issue.  A wheelchair will allow patient to safely perform daily activities.  The patient is not able to propel herself in the home using a standard weight wheelchair due to generalized weakness, pain and decreased endurance. The patient can self propel in a light weight wheelchair.  Reassess in person in 2 weeks.  Today, I spent more than 45 min with the patient: >50% of the time counseling regards her multiple medical problems, doing extensive chart review, completed paperwork for the patient her husband (coordinating her care).    I discussed the assessment and treatment plan with the patient. The patient was provided an opportunity to ask questions and all were answered. The patient agreed with the plan and demonstrated an understanding of the instructions.   The patient was advised to call back or seek an in-person evaluation if the symptoms worsen or if the condition fails to improve as anticipated.  I provided 45 minutes of non-face-to-face time during this encounter.  Kathlene November, MD

## 2019-06-04 NOTE — Telephone Encounter (Signed)
Pt already reminded that she is set up for telephone visit and NOT to come to office.

## 2019-06-04 NOTE — Telephone Encounter (Signed)
Closing note- paperwork was actually for Pt's husband Collins [place] to care for his wife Schelly.

## 2019-06-05 NOTE — Telephone Encounter (Signed)
Form and OV note completed and faxed to [facility] at [phone]. Form sent for scanning.

## 2019-06-05 NOTE — Telephone Encounter (Signed)
Form received. Need to know primary and secondary dx. Last OV note also needs to mention why Pt needs wheelchair. Form placed in PCP red folder for completion.

## 2019-06-05 NOTE — Telephone Encounter (Signed)
Done

## 2019-06-05 NOTE — Assessment & Plan Note (Addendum)
Hospital follow-up, gastroenteritis, multifocal pneumonia: Status post admission for gastroenteritis and multifocal pneumonia, also AKI. She was hydrated, initially got antibiotics but later on they were discontinued.  They noted that this time the infiltrates on imaging she had no resting hypoxia, fever or cough. At this point she does not have fever, GI symptoms resolve, still has DOE but no shortness of breath at rest. Plan:  Nurse visit: Vital signs, O2 sat, CMP, CBC, chest x-ray.  Will call and schedule Continue her normal medications. Recommend to get a pulse oximeter, O2 sat should be more than 94%. Paperwork for her husband completed. Wheelchair: The patient suffers from a recent gastroenteritis, multifocal pneumonia, COVID-19, prolonged bedrest, generalized weakness, DOE, which impairs their ability to perform daily activities like moving around the house, going to medical appointments, going shopping.  A walker/walker/crutches will not resolve the issue.  A wheelchair will allow patient to safely perform daily activities.  The patient is not able to propel herself in the home using a standard weight wheelchair due to generalized weakness, pain and decreased endurance. The patient can self propel in a light weight wheelchair.  Reassess in person in 2 weeks.

## 2019-06-06 ENCOUNTER — Other Ambulatory Visit (INDEPENDENT_AMBULATORY_CARE_PROVIDER_SITE_OTHER): Payer: Medicare Other

## 2019-06-06 ENCOUNTER — Other Ambulatory Visit: Payer: Self-pay

## 2019-06-06 ENCOUNTER — Ambulatory Visit (HOSPITAL_BASED_OUTPATIENT_CLINIC_OR_DEPARTMENT_OTHER)
Admission: RE | Admit: 2019-06-06 | Discharge: 2019-06-06 | Disposition: A | Discharge status: Home / Self Care | Payer: Medicare Other | Source: Ambulatory Visit | Attending: Internal Medicine | Admitting: Internal Medicine

## 2019-06-06 ENCOUNTER — Ambulatory Visit: Payer: Medicare Other | Admitting: Internal Medicine

## 2019-06-06 ENCOUNTER — Ambulatory Visit (INDEPENDENT_AMBULATORY_CARE_PROVIDER_SITE_OTHER): Payer: Medicare Other | Admitting: Internal Medicine

## 2019-06-06 ENCOUNTER — Telehealth: Payer: Self-pay | Admitting: Internal Medicine

## 2019-06-06 VITALS — BP 98/62 | HR 90 | Temp 95.1°F

## 2019-06-06 DIAGNOSIS — J189 Pneumonia, unspecified organism: Secondary | ICD-10-CM | POA: Diagnosis not present

## 2019-06-06 DIAGNOSIS — J984 Other disorders of lung: Secondary | ICD-10-CM | POA: Insufficient documentation

## 2019-06-06 DIAGNOSIS — K760 Fatty (change of) liver, not elsewhere classified: Secondary | ICD-10-CM | POA: Diagnosis not present

## 2019-06-06 DIAGNOSIS — Z905 Acquired absence of kidney: Secondary | ICD-10-CM | POA: Insufficient documentation

## 2019-06-06 DIAGNOSIS — K5792 Diverticulitis of intestine, part unspecified, without perforation or abscess without bleeding: Secondary | ICD-10-CM | POA: Diagnosis not present

## 2019-06-06 DIAGNOSIS — K529 Noninfective gastroenteritis and colitis, unspecified: Secondary | ICD-10-CM

## 2019-06-06 DIAGNOSIS — R1032 Left lower quadrant pain: Secondary | ICD-10-CM | POA: Diagnosis not present

## 2019-06-06 DIAGNOSIS — R918 Other nonspecific abnormal finding of lung field: Secondary | ICD-10-CM | POA: Diagnosis not present

## 2019-06-06 LAB — CBC WITH DIFFERENTIAL/PLATELET
Basophils Absolute: 0.1 10*3/uL (ref 0.0–0.1)
Basophils Relative: 1.1 % (ref 0.0–3.0)
Eosinophils Absolute: 0.1 10*3/uL (ref 0.0–0.7)
Eosinophils Relative: 1.1 % (ref 0.0–5.0)
HCT: 36.8 % (ref 36.0–46.0)
Hemoglobin: 11.7 g/dL — ABNORMAL LOW (ref 12.0–15.0)
Lymphocytes Relative: 18.8 % (ref 12.0–46.0)
Lymphs Abs: 1.8 10*3/uL (ref 0.7–4.0)
MCHC: 31.8 g/dL (ref 30.0–36.0)
MCV: 89 fl (ref 78.0–100.0)
Monocytes Absolute: 0.8 10*3/uL (ref 0.1–1.0)
Monocytes Relative: 9 % (ref 3.0–12.0)
Neutro Abs: 6.6 10*3/uL (ref 1.4–7.7)
Neutrophils Relative %: 70 % (ref 43.0–77.0)
Platelets: 453 10*3/uL — ABNORMAL HIGH (ref 150.0–400.0)
RBC: 4.14 Mil/uL (ref 3.87–5.11)
RDW: 16.3 % — ABNORMAL HIGH (ref 11.5–15.5)
WBC: 9.4 10*3/uL (ref 4.0–10.5)

## 2019-06-06 LAB — COMPREHENSIVE METABOLIC PANEL
ALT: 10 U/L (ref 0–35)
AST: 11 U/L (ref 0–37)
Albumin: 3.5 g/dL (ref 3.5–5.2)
Alkaline Phosphatase: 108 U/L (ref 39–117)
BUN: 10 mg/dL (ref 6–23)
CO2: 25 mEq/L (ref 19–32)
Calcium: 8.8 mg/dL (ref 8.4–10.5)
Chloride: 103 mEq/L (ref 96–112)
Creatinine, Ser: 0.77 mg/dL (ref 0.40–1.20)
GFR: 75.09 mL/min (ref >60.00)
Glucose, Bld: 199 mg/dL — ABNORMAL HIGH (ref 70–99)
Potassium: 4.1 mEq/L (ref 3.5–5.1)
Sodium: 138 mEq/L (ref 135–145)
Total Bilirubin: 0.7 mg/dL (ref 0.2–1.2)
Total Protein: 6.4 g/dL (ref 6.0–8.3)

## 2019-06-06 MED ORDER — METRONIDAZOLE 500 MG PO TABS: 500.0000 mg | ORAL_TABLET | Freq: Three times a day (TID) | ORAL | 0 refills | Status: DC

## 2019-06-06 MED ORDER — CIPROFLOXACIN HCL 500 MG PO TABS: 500.0000 mg | ORAL_TABLET | Freq: Two times a day (BID) | ORAL | 0 refills | Status: DC

## 2019-06-06 MED ORDER — IOHEXOL 300 MG/ML  SOLN
80.0000 mL | Freq: Once | INTRAMUSCULAR | Status: AC | PRN
  Administered 2019-06-06: 80 mL via INTRAVENOUS

## 2019-06-06 NOTE — Telephone Encounter (Signed)
Pt came in office stating wanting to inform provider that she is needing a wheelchair, pt stated will have document fax to provider or was already faxed to provider and is needing the approval and document filled out ASAP. Please advise.

## 2019-06-06 NOTE — Telephone Encounter (Signed)
Received fax confirmation

## 2019-06-06 NOTE — Progress Notes (Deleted)
Pt here for Blood pressure check per Dr  Larose Kells.  Pt currently takes: Diltiazem 120mg , lisinopril 10, and furosemide.   Pt reports compliance with medication.  BP today @ =98/62 HR = 90  Pt advised per Dr. Larose Kells, he will see her today.

## 2019-06-06 NOTE — Telephone Encounter (Signed)
LMOM for Chris w/ verbal orders.  

## 2019-06-06 NOTE — Progress Notes (Signed)
Subjective:    Patient ID: Melinda Mckinney, female    DOB: 04/24/1956, 63 y.o.   MRN: [id number]  DOS:  06/06/2019 Type of visit - description: Acute visit Patient was here for a nurse visit, I was told she is not feeling well.  The patient reports that for the last 2 days, since the last time I talked to her she has developed a left lower quadrant abdominal pain.  She continue with poor appetite, lack of energy, myalgias (4 weeks already).  Review of Systems No fever chills No nausea, vomiting, diarrhea. No dysuria, gross hematuria difficulty urinating  Past Medical History:  Diagnosis Date  . Abnormal Pap smear of cervix   . Anemia   . Anxiety and depression   . Asthma   . Atrial fibrillation ([place]) 07/2017  . Borderline diabetes   . CAD (coronary artery disease) ~2009   stent   . Cervical cancer ([place])    "stage 1; had cryotherapy"  . Cervical dysplasia   . Chronic lower back pain   . DDD (degenerative disc disease), lumbar    Dr. Nelva Bush, recommends lumbar epidural steroid injections L5-S1 to the right  . Depression   . Fibromyalgia   . GERD (gastroesophageal reflux disease)   . Headache    "only when I'm short of breath"  (07/27/2017)  . High cholesterol   . History of hiatal hernia   . History of kidney stones   . Hypertension   . Hypothyroidism   . Insomnia 09/27/2013  . Lichen sclerosus    Vulvar area  . Osteoporosis   . Thyroid disease   . Vitamin D deficiency     Past Surgical History:  Procedure Laterality Date  . CARDIAC CATHETERIZATION  ~ 2008  . CARDIOVERSION N/A 08/05/2017   Procedure: CARDIOVERSION;  Surgeon: Evans Lance, MD;  Location: [facility];  Service: Cardiovascular;  Laterality: N/A;  . CARDIOVERSION N/A 08/24/2017   Procedure: CARDIOVERSION;  Surgeon: Sanda Klein, MD;  Location: [facility];  Service: Cardiovascular;  Laterality: N/A;  . [place]; ~ 1978/1979; 1984  . ESOPHAGOMYOTOMY  2009  . GYNECOLOGIC CRYOSURGERY  X 2    "stage 1 cancer"  . HERNIA REPAIR  X 6   "all in my stomach" (07/27/2017)  . KNEE ARTHROSCOPY Right   . NEPHRECTOMY Right 1991   in [place], due to fibrosis and lithiasis  . TUBAL LIGATION      Social History   Socioeconomic History  . Marital status: Married    Spouse name: Not on file  . Number of children: 3  . Years of education: Not on file  . Highest education level: Not on file  Occupational History  . 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 Financial resource strain: Not on file  . Food insecurity    Worry: Not on file    Inability: Not on file  . Transportation needs    Medical: Not on file    Non-medical: Not on file  Tobacco Use  . Smoking status: Former Smoker    Packs/day: 2.00    Years: 28.00    Pack years: 56.00    Types: Cigarettes    Quit date: 2000    Years since quitting: 20.8  . Smokeless tobacco: Never Used  Substance and Sexual Activity  . Alcohol use: Yes    Comment: 07/27/2017 "3-4 drinks//month"  . Drug use: No  . Sexual activity: Not Currently    Comment: 1ST  INTERCOURSE- 19, PARTNERS - 1  Lifestyle  . Physical activity    Days per week: Not on file    Minutes per session: Not on file  . Stress: Not on file  Relationships  . Social Herbalist on phone: Not on file    Gets together: Not on file    Attends religious service: Not on file    Active member of club or organization: Not on file    Attends meetings of clubs or organizations: Not on file    Relationship status: Not on file  . Intimate partner violence    Fear of current or ex partner: Not on file    Emotionally abused: Not on file    Physically abused: Not on file    Forced sexual activity: Not on file  Other Topics Concern  . Not on file  Social History Narrative   Original from [place]   3 children, 2 alive   Household --pt and husband       Allergies as of 06/06/2019      Reactions   Penicillins Swelling, Rash   Has patient had a PCN  reaction causing immediate rash, facial/tongue/throat swelling, SOB or lightheadedness with hypotension: No Has patient had a PCN reaction causing severe rash involving mucus membranes or skin necrosis: No Has patient had a PCN reaction that required hospitalization: No Has patient had a PCN reaction occurring within the last 10 years: No If all of the above answers are "NO", then may proceed with Cephalosporin use.      Medication List       Accurate as of June 06, 2019 12:09 PM. If you have any questions, ask your nurse or doctor.        allopurinol 100 MG tablet Commonly known as: ZYLOPRIM Take 0.5 tablets (50 mg total) by mouth daily. What changed:   how much to take  when to take this   atorvastatin 40 MG tablet Commonly known as: LIPITOR Take 1 tablet (40 mg total) by mouth at bedtime.   cyclobenzaprine 10 MG tablet Commonly known as: FLEXERIL Take 1 tablet (10 mg total) by mouth 2 (two) times daily as needed for muscle spasms.   diltiazem 120 MG tablet Commonly known as: CARDIZEM TAKE 1/2 TABLET BY MOUTH TWICE DAILY   DULoxetine 60 MG capsule Commonly known as: CYMBALTA Take 1 capsule (60 mg total) by mouth daily.   furosemide 20 MG tablet Commonly known as: LASIX Take 1 tablet (20 mg total) by mouth daily.   levothyroxine 125 MCG tablet Commonly known as: SYNTHROID TAKE ONE TABLET BY MOUTH DAILY BEFORE BREAKFAST What changed: See the new instructions.   lisinopril 10 MG tablet Commonly known as: ZESTRIL Take 1 tablet (10 mg total) by mouth daily.   metFORMIN 500 MG tablet Commonly known as: GLUCOPHAGE Take 1 tablet (500 mg total) by mouth 2 (two) times daily with a meal.   omeprazole 20 MG capsule Commonly known as: PRILOSEC Take 1 capsule (20 mg total) by mouth daily. What changed: when to take this   PRESCRIPTION MEDICATION 4 g See admin instructions. Testosterone 2 % cream  APPLY FOUR CLICKS (ONE GRAM) TO INNER THIGH DAILY. ROTATE    raloxifene 60 MG tablet Commonly known as: EVISTA Take 1 tablet (60 mg total) by mouth daily. What changed: when to take this   rivaroxaban 20 MG Tabs tablet Commonly known as: Xarelto Take 1 tablet (20 mg total) by mouth daily with  supper.   SOTALOL AF 120 MG Tabs TAKE ONE TABLET BY MOUTH EVERY 12 HOURS   Vitamin D3 125 MCG (5000 UT) Caps Take 5,000 Units by mouth daily.   zolpidem 5 MG tablet Commonly known as: AMBIEN Take 1 tablet (5 mg total) by mouth at bedtime as needed. for sleep What changed: reasons to take this           Objective:   Physical Exam BP 98/62 (BP Location: Right Arm, Cuff Size: Large)   Pulse 90   Temp (!) 95.1 F (35.1 C) (Temporal)   SpO2 95%  General:   Well developed, NAD, BMI noted.  HEENT:  Normocephalic . Face symmetric, atraumatic Lungs:  CTA B Normal respiratory effort, no intercostal retractions, no accessory muscle use. Heart: Irregularly irregular.  no pretibial edema bilaterally  Abdomen:  Not distended, soft, tender at the left lower quadrant without mass or rebound. Skin and subcutaneous tissue seems normal with no evidence of cellulitis or abscess.  Bowel sounds are present. Skin: Not pale. Not jaundice Neurologic:  alert & oriented X3.  Speech normal, gait unassisted. Psych--  Cognition and judgment appear intact.  Cooperative with normal attention span and concentration.  Behavior appropriate. No anxious or depressed appearing.     Assessment     Assessment   Hyperglycemia HTN High cholesterol Hypothyroidism Anxiety depression, insomnia CV: --Atrial fibrillation DX 06-2017, s/p cardioversions, intolerant to metoprolol (?) --CAD: stent ~ 2009 Morbid obesity: BMI 42 (2011) Vitamin D deficiency, saw Dr. Cruzita Lederer 07-2016, rtc 1 year, celiac dz test (-), unlikely  Malabsortion; cont supplements  GERD H/o achalasia, s/p [place] 2009 SINGLE KIDNEY---S/p R  Nephrectomy ([place] 1991 due to stones/fibrosis) MSK:  --Fibromyalgia --DJD  Knee pain R --Back pain -- s/p epidural 10-16 Osteoporosis: Used to be managed by gynecology, now PCP. 08-2014 T score -2.8, 09-2015: T score -2.7.- Was Rx Evista 09-2014  h/o cervical dysplasia Derm: vulvar pruritus, temovate prn ok per gyn  PLAN: Recent hospital admission with gastroenteritis, multifocal pneumonia: We are checking CMP, CBC and chest x-ray. O2 sat at home consistently 97%. Left lower quadrant abdominal pain: This is a new symptom, started 2 days ago, she is afebrile.  On exam she is definitely tender without mass or rebound. Does not seem to have an abscess or cellulitis of the abdominal wall. We will check a UA, urine culture and a abdomen and pelvis CT rule out diverticulitis. ER if symptoms increase.   HTN: Blood pressure today is low, she is afebrile, does not look septic.  Will hold Lasix and lisinopril, ambulatory BPs, see AVS  Next follow-up should be in 2 to 3 months depending on results. Addendum:  CT show acute diverticulitis without complications, chest x-ray improving infiltrates, blood work satisfactory. I advised patient of all the results will start Cipro and Flagyl. She will call me if is not improving soon.

## 2019-06-06 NOTE — Telephone Encounter (Signed)
Melinda Mckinney with [facility] calling for verbal PT orders 1 wk 5    For Home safety, fall prevention;;, gait and strenth training  [phone]

## 2019-06-06 NOTE — Patient Instructions (Addendum)
Please go to the first floor  Get a chest x-ray  Get a CT of the abdomen  Your blood pressure is low today, hold lisinopril and Lasix for 2 days  Check your blood pressure 3 times a day  Call tomorrow with readings

## 2019-06-06 NOTE — Telephone Encounter (Signed)
Already completed

## 2019-06-07 LAB — URINALYSIS, ROUTINE W REFLEX MICROSCOPIC
Hgb urine dipstick: NEGATIVE
Ketones, ur: NEGATIVE
Leukocytes,Ua: NEGATIVE
Nitrite: NEGATIVE
Specific Gravity, Urine: >=1.03 — AB (ref 1.000–1.030)
Total Protein, Urine: 30 — AB
Urine Glucose: NEGATIVE
Urobilinogen, UA: 0.2 (ref 0.0–1.0)
pH: 5.5 (ref 5.0–8.0)

## 2019-06-07 LAB — URINE CULTURE
MICRO NUMBER:: 1018839
SPECIMEN QUALITY:: ADEQUATE

## 2019-06-08 NOTE — Assessment & Plan Note (Signed)
Recent hospital admission with gastroenteritis, multifocal pneumonia: We are checking CMP, CBC and chest x-ray. O2 sat at home consistently 97%. Left lower quadrant abdominal pain: This is a new symptom, started 2 days ago, she is afebrile.  On exam she is definitely tender without mass or rebound. Does not seem to have an abscess or cellulitis of the abdominal wall. We will check a UA, urine culture and a abdomen and pelvis CT rule out diverticulitis. ER if symptoms increase.   HTN: Blood pressure today is low, she is afebrile, does not look septic.  Will hold Lasix and lisinopril, ambulatory BPs, see AVS  Next follow-up should be in 2 to 3 months depending on results. Addendum:  CT show acute diverticulitis without complications, chest x-ray improving infiltrates, blood work satisfactory. I advised patient of all the results will start Cipro and Flagyl. She will call me if is not improving soon.

## 2019-06-09 ENCOUNTER — Other Ambulatory Visit: Payer: Self-pay | Admitting: Internal Medicine

## 2019-06-11 ENCOUNTER — Other Ambulatory Visit: Payer: Self-pay

## 2019-06-11 ENCOUNTER — Emergency Department (HOSPITAL_BASED_OUTPATIENT_CLINIC_OR_DEPARTMENT_OTHER): Payer: Medicare Other

## 2019-06-11 ENCOUNTER — Encounter (HOSPITAL_BASED_OUTPATIENT_CLINIC_OR_DEPARTMENT_OTHER): Payer: Self-pay

## 2019-06-11 ENCOUNTER — Inpatient Hospital Stay (HOSPITAL_BASED_OUTPATIENT_CLINIC_OR_DEPARTMENT_OTHER)
Admission: EM | Admit: 2019-06-11 | Discharge: 2019-06-26 | DRG: 854 | Disposition: A | Discharge status: Home Health | Payer: Medicare Other | Attending: Internal Medicine | Admitting: Internal Medicine

## 2019-06-11 DIAGNOSIS — I5043 Acute on chronic combined systolic (congestive) and diastolic (congestive) heart failure: Secondary | ICD-10-CM | POA: Diagnosis not present

## 2019-06-11 DIAGNOSIS — Z8619 Personal history of other infectious and parasitic diseases: Secondary | ICD-10-CM

## 2019-06-11 DIAGNOSIS — K219 Gastro-esophageal reflux disease without esophagitis: Secondary | ICD-10-CM | POA: Diagnosis present

## 2019-06-11 DIAGNOSIS — I428 Other cardiomyopathies: Secondary | ICD-10-CM | POA: Diagnosis present

## 2019-06-11 DIAGNOSIS — I10 Essential (primary) hypertension: Secondary | ICD-10-CM | POA: Diagnosis not present

## 2019-06-11 DIAGNOSIS — Z79890 Hormone replacement therapy (postmenopausal): Secondary | ICD-10-CM

## 2019-06-11 DIAGNOSIS — R7303 Prediabetes: Secondary | ICD-10-CM | POA: Diagnosis present

## 2019-06-11 DIAGNOSIS — Z950 Presence of cardiac pacemaker: Secondary | ICD-10-CM

## 2019-06-11 DIAGNOSIS — I361 Nonrheumatic tricuspid (valve) insufficiency: Secondary | ICD-10-CM | POA: Diagnosis not present

## 2019-06-11 DIAGNOSIS — Z95810 Presence of automatic (implantable) cardiac defibrillator: Secondary | ICD-10-CM

## 2019-06-11 DIAGNOSIS — I5023 Acute on chronic systolic (congestive) heart failure: Secondary | ICD-10-CM | POA: Diagnosis not present

## 2019-06-11 DIAGNOSIS — I5042 Chronic combined systolic (congestive) and diastolic (congestive) heart failure: Secondary | ICD-10-CM | POA: Diagnosis present

## 2019-06-11 DIAGNOSIS — I5022 Chronic systolic (congestive) heart failure: Secondary | ICD-10-CM | POA: Diagnosis present

## 2019-06-11 DIAGNOSIS — I081 Rheumatic disorders of both mitral and tricuspid valves: Secondary | ICD-10-CM | POA: Diagnosis not present

## 2019-06-11 DIAGNOSIS — Z880 Allergy status to penicillin: Secondary | ICD-10-CM

## 2019-06-11 DIAGNOSIS — M797 Fibromyalgia: Secondary | ICD-10-CM | POA: Diagnosis present

## 2019-06-11 DIAGNOSIS — J189 Pneumonia, unspecified organism: Secondary | ICD-10-CM | POA: Diagnosis not present

## 2019-06-11 DIAGNOSIS — F329 Major depressive disorder, single episode, unspecified: Secondary | ICD-10-CM | POA: Diagnosis present

## 2019-06-11 DIAGNOSIS — I2510 Atherosclerotic heart disease of native coronary artery without angina pectoris: Secondary | ICD-10-CM | POA: Diagnosis not present

## 2019-06-11 DIAGNOSIS — K5732 Diverticulitis of large intestine without perforation or abscess without bleeding: Secondary | ICD-10-CM | POA: Diagnosis not present

## 2019-06-11 DIAGNOSIS — E876 Hypokalemia: Secondary | ICD-10-CM | POA: Diagnosis present

## 2019-06-11 DIAGNOSIS — G8929 Other chronic pain: Secondary | ICD-10-CM | POA: Diagnosis present

## 2019-06-11 DIAGNOSIS — N1832 Chronic kidney disease, stage 3b: Secondary | ICD-10-CM | POA: Diagnosis present

## 2019-06-11 DIAGNOSIS — G4700 Insomnia, unspecified: Secondary | ICD-10-CM | POA: Diagnosis present

## 2019-06-11 DIAGNOSIS — A419 Sepsis, unspecified organism: Secondary | ICD-10-CM | POA: Diagnosis present

## 2019-06-11 DIAGNOSIS — N179 Acute kidney failure, unspecified: Secondary | ICD-10-CM | POA: Diagnosis not present

## 2019-06-11 DIAGNOSIS — I4819 Other persistent atrial fibrillation: Secondary | ICD-10-CM | POA: Diagnosis not present

## 2019-06-11 DIAGNOSIS — R7611 Nonspecific reaction to tuberculin skin test without active tuberculosis: Secondary | ICD-10-CM | POA: Diagnosis not present

## 2019-06-11 DIAGNOSIS — I083 Combined rheumatic disorders of mitral, aortic and tricuspid valves: Secondary | ICD-10-CM | POA: Diagnosis present

## 2019-06-11 DIAGNOSIS — E119 Type 2 diabetes mellitus without complications: Secondary | ICD-10-CM

## 2019-06-11 DIAGNOSIS — F419 Anxiety disorder, unspecified: Secondary | ICD-10-CM | POA: Diagnosis present

## 2019-06-11 DIAGNOSIS — E669 Obesity, unspecified: Secondary | ICD-10-CM | POA: Diagnosis present

## 2019-06-11 DIAGNOSIS — B370 Candidal stomatitis: Secondary | ICD-10-CM | POA: Diagnosis not present

## 2019-06-11 DIAGNOSIS — E1159 Type 2 diabetes mellitus with other circulatory complications: Secondary | ICD-10-CM | POA: Diagnosis not present

## 2019-06-11 DIAGNOSIS — Z452 Encounter for adjustment and management of vascular access device: Secondary | ICD-10-CM | POA: Diagnosis not present

## 2019-06-11 DIAGNOSIS — Z79899 Other long term (current) drug therapy: Secondary | ICD-10-CM

## 2019-06-11 DIAGNOSIS — I959 Hypotension, unspecified: Secondary | ICD-10-CM | POA: Diagnosis not present

## 2019-06-11 DIAGNOSIS — I509 Heart failure, unspecified: Secondary | ICD-10-CM

## 2019-06-11 DIAGNOSIS — E039 Hypothyroidism, unspecified: Secondary | ICD-10-CM | POA: Diagnosis present

## 2019-06-11 DIAGNOSIS — Z955 Presence of coronary angioplasty implant and graft: Secondary | ICD-10-CM

## 2019-06-11 DIAGNOSIS — I130 Hypertensive heart and chronic kidney disease with heart failure and stage 1 through stage 4 chronic kidney disease, or unspecified chronic kidney disease: Secondary | ICD-10-CM | POA: Diagnosis not present

## 2019-06-11 DIAGNOSIS — E559 Vitamin D deficiency, unspecified: Secondary | ICD-10-CM | POA: Diagnosis present

## 2019-06-11 DIAGNOSIS — R0989 Other specified symptoms and signs involving the circulatory and respiratory systems: Secondary | ICD-10-CM | POA: Diagnosis not present

## 2019-06-11 DIAGNOSIS — Z7289 Other problems related to lifestyle: Secondary | ICD-10-CM

## 2019-06-11 DIAGNOSIS — M810 Age-related osteoporosis without current pathological fracture: Secondary | ICD-10-CM | POA: Diagnosis present

## 2019-06-11 DIAGNOSIS — U071 COVID-19: Secondary | ICD-10-CM | POA: Diagnosis present

## 2019-06-11 DIAGNOSIS — Z8249 Family history of ischemic heart disease and other diseases of the circulatory system: Secondary | ICD-10-CM

## 2019-06-11 DIAGNOSIS — Z87891 Personal history of nicotine dependence: Secondary | ICD-10-CM

## 2019-06-11 DIAGNOSIS — Z801 Family history of malignant neoplasm of trachea, bronchus and lung: Secondary | ICD-10-CM

## 2019-06-11 DIAGNOSIS — A4189 Other specified sepsis: Principal | ICD-10-CM | POA: Diagnosis present

## 2019-06-11 DIAGNOSIS — I472 Ventricular tachycardia: Secondary | ICD-10-CM | POA: Diagnosis not present

## 2019-06-11 DIAGNOSIS — I4891 Unspecified atrial fibrillation: Secondary | ICD-10-CM

## 2019-06-11 DIAGNOSIS — Z905 Acquired absence of kidney: Secondary | ICD-10-CM

## 2019-06-11 DIAGNOSIS — I25119 Atherosclerotic heart disease of native coronary artery with unspecified angina pectoris: Secondary | ICD-10-CM | POA: Diagnosis not present

## 2019-06-11 DIAGNOSIS — E785 Hyperlipidemia, unspecified: Secondary | ICD-10-CM | POA: Diagnosis present

## 2019-06-11 DIAGNOSIS — K5792 Diverticulitis of intestine, part unspecified, without perforation or abscess without bleeding: Secondary | ICD-10-CM | POA: Diagnosis present

## 2019-06-11 DIAGNOSIS — I5021 Acute systolic (congestive) heart failure: Secondary | ICD-10-CM | POA: Diagnosis not present

## 2019-06-11 DIAGNOSIS — Z7901 Long term (current) use of anticoagulants: Secondary | ICD-10-CM

## 2019-06-11 DIAGNOSIS — I340 Nonrheumatic mitral (valve) insufficiency: Secondary | ICD-10-CM | POA: Diagnosis not present

## 2019-06-11 DIAGNOSIS — R946 Abnormal results of thyroid function studies: Secondary | ICD-10-CM | POA: Diagnosis not present

## 2019-06-11 DIAGNOSIS — Z8616 Personal history of COVID-19: Secondary | ICD-10-CM

## 2019-06-11 DIAGNOSIS — Z6839 Body mass index (BMI) 39.0-39.9, adult: Secondary | ICD-10-CM

## 2019-06-11 DIAGNOSIS — Z9861 Coronary angioplasty status: Secondary | ICD-10-CM | POA: Diagnosis present

## 2019-06-11 DIAGNOSIS — Z8541 Personal history of malignant neoplasm of cervix uteri: Secondary | ICD-10-CM

## 2019-06-11 DIAGNOSIS — Z9981 Dependence on supplemental oxygen: Secondary | ICD-10-CM

## 2019-06-11 DIAGNOSIS — R739 Hyperglycemia, unspecified: Secondary | ICD-10-CM | POA: Diagnosis not present

## 2019-06-11 DIAGNOSIS — Z7984 Long term (current) use of oral hypoglycemic drugs: Secondary | ICD-10-CM

## 2019-06-11 LAB — CBC WITH DIFFERENTIAL/PLATELET
Abs Immature Granulocytes: 0.09 10*3/uL — ABNORMAL HIGH (ref 0.00–0.07)
Basophils Absolute: 0.1 10*3/uL (ref 0.0–0.1)
Basophils Relative: 1 %
Eosinophils Absolute: 0 10*3/uL (ref 0.0–0.5)
Eosinophils Relative: 0 %
HCT: 39.1 % (ref 36.0–46.0)
Hemoglobin: 11.7 g/dL — ABNORMAL LOW (ref 12.0–15.0)
Immature Granulocytes: 1 %
Lymphocytes Relative: 16 %
Lymphs Abs: 1.4 10*3/uL (ref 0.7–4.0)
MCH: 27.8 pg (ref 26.0–34.0)
MCHC: 29.9 g/dL — ABNORMAL LOW (ref 30.0–36.0)
MCV: 92.9 fL (ref 80.0–100.0)
Monocytes Absolute: 0.5 10*3/uL (ref 0.1–1.0)
Monocytes Relative: 6 %
Neutro Abs: 6.8 10*3/uL (ref 1.7–7.7)
Neutrophils Relative %: 76 %
Platelets: 363 10*3/uL (ref 150–400)
RBC: 4.21 MIL/uL (ref 3.87–5.11)
RDW: 16.2 % — ABNORMAL HIGH (ref 11.5–15.5)
WBC: 9 10*3/uL (ref 4.0–10.5)
nRBC: 0 % (ref 0.0–0.2)

## 2019-06-11 LAB — APTT: aPTT: 40 seconds — ABNORMAL HIGH (ref 24–36)

## 2019-06-11 LAB — URINALYSIS, ROUTINE W REFLEX MICROSCOPIC
Bilirubin Urine: NEGATIVE
Glucose, UA: NEGATIVE mg/dL
Ketones, ur: NEGATIVE mg/dL
Nitrite: NEGATIVE
Protein, ur: 30 mg/dL — AB
Specific Gravity, Urine: >1.046 — ABNORMAL HIGH (ref 1.005–1.030)
pH: 6 (ref 5.0–8.0)

## 2019-06-11 LAB — COMPREHENSIVE METABOLIC PANEL
ALT: 8 U/L (ref 0–44)
AST: 14 U/L — ABNORMAL LOW (ref 15–41)
Albumin: 4 g/dL (ref 3.5–5.0)
Alkaline Phosphatase: 88 U/L (ref 38–126)
Anion gap: 12 (ref 5–15)
BUN: 17 mg/dL (ref 8–23)
CO2: 22 mmol/L (ref 22–32)
Calcium: 9.7 mg/dL (ref 8.9–10.3)
Chloride: 104 mmol/L (ref 98–111)
Creatinine, Ser: 1.13 mg/dL — ABNORMAL HIGH (ref 0.44–1.00)
GFR calc Af Amer: 59 mL/min — ABNORMAL LOW (ref >60)
GFR calc non Af Amer: 51 mL/min — ABNORMAL LOW (ref >60)
Glucose, Bld: 240 mg/dL — ABNORMAL HIGH (ref 70–99)
Potassium: 4 mmol/L (ref 3.5–5.1)
Sodium: 138 mmol/L (ref 135–145)
Total Bilirubin: 0.6 mg/dL (ref 0.3–1.2)
Total Protein: 6.9 g/dL (ref 6.5–8.1)

## 2019-06-11 LAB — LACTIC ACID, PLASMA
Lactic Acid, Venous: 2.9 mmol/L (ref 0.5–1.9)
Lactic Acid, Venous: 1.5 mmol/L (ref 0.5–1.9)

## 2019-06-11 LAB — PROTIME-INR
INR: 1.6 — ABNORMAL HIGH (ref 0.8–1.2)
Prothrombin Time: 18.7 seconds — ABNORMAL HIGH (ref 11.4–15.2)

## 2019-06-11 LAB — MRSA PCR SCREENING: MRSA by PCR: NEGATIVE

## 2019-06-11 LAB — LIPASE, BLOOD: Lipase: 25 U/L (ref 11–51)

## 2019-06-11 MED ORDER — PANTOPRAZOLE SODIUM 40 MG PO TBEC
40.0000 mg | DELAYED_RELEASE_TABLET | Freq: Two times a day (BID) | ORAL | Status: DC
  Administered 2019-06-12 – 2019-06-26 (×30): 40 mg via ORAL
  Filled 2019-06-11 (×30): qty 1

## 2019-06-11 MED ORDER — ONDANSETRON HCL 4 MG PO TABS: 4.0000 mg | ORAL_TABLET | Freq: Four times a day (QID) | ORAL | Status: DC | PRN

## 2019-06-11 MED ORDER — CHLORHEXIDINE GLUCONATE CLOTH 2 % EX PADS
6.0000 | MEDICATED_PAD | Freq: Every day | CUTANEOUS | Status: DC
  Administered 2019-06-11 – 2019-06-26 (×15): 6 via TOPICAL

## 2019-06-11 MED ORDER — METRONIDAZOLE IN NACL 5-0.79 MG/ML-% IV SOLN
500.0000 mg | Freq: Three times a day (TID) | INTRAVENOUS | Status: DC
  Administered 2019-06-12 – 2019-06-14 (×8): 500 mg via INTRAVENOUS
  Filled 2019-06-11 (×8): qty 100

## 2019-06-11 MED ORDER — ZOLPIDEM TARTRATE 5 MG PO TABS
5.0000 mg | ORAL_TABLET | Freq: Every evening | ORAL | Status: DC | PRN
  Administered 2019-06-12 – 2019-06-20 (×10): 5 mg via ORAL
  Filled 2019-06-11 (×10): qty 1

## 2019-06-11 MED ORDER — SODIUM CHLORIDE 0.9 % IV SOLN
1.0000 g | Freq: Once | INTRAVENOUS | Status: AC
  Administered 2019-06-11: 1 g via INTRAVENOUS
  Filled 2019-06-11: qty 10

## 2019-06-11 MED ORDER — SOTALOL HCL 120 MG PO TABS
120.0000 mg | ORAL_TABLET | Freq: Two times a day (BID) | ORAL | Status: DC
  Administered 2019-06-12 – 2019-06-17 (×12): 120 mg via ORAL
  Filled 2019-06-11 (×14): qty 1

## 2019-06-11 MED ORDER — VANCOMYCIN HCL IN DEXTROSE 1-5 GM/200ML-% IV SOLN
1000.0000 mg | Freq: Once | INTRAVENOUS | Status: AC
  Administered 2019-06-11: 1000 mg via INTRAVENOUS
  Filled 2019-06-11: qty 200

## 2019-06-11 MED ORDER — DILTIAZEM HCL 100 MG IV SOLR
5.0000 mg/h | INTRAVENOUS | Status: DC
  Administered 2019-06-11 – 2019-06-12 (×3): 5 mg/h via INTRAVENOUS
  Filled 2019-06-11 (×2): qty 100

## 2019-06-11 MED ORDER — VANCOMYCIN HCL 10 G IV SOLR
2500.0000 mg | INTRAVENOUS | Status: DC
  Filled 2019-06-11: qty 2500

## 2019-06-11 MED ORDER — ALLOPURINOL 100 MG PO TABS
100.0000 mg | ORAL_TABLET | Freq: Every day | ORAL | Status: DC
  Administered 2019-06-12 – 2019-06-25 (×15): 100 mg via ORAL
  Filled 2019-06-11 (×15): qty 1

## 2019-06-11 MED ORDER — HYDROMORPHONE HCL 1 MG/ML IJ SOLN
0.5000 mg | INTRAMUSCULAR | Status: DC | PRN
  Administered 2019-06-12 – 2019-06-26 (×4): 0.5 mg via INTRAVENOUS
  Filled 2019-06-11 (×2): qty 1
  Filled 2019-06-11 (×2): qty 0.5

## 2019-06-11 MED ORDER — SODIUM CHLORIDE 0.9 % IV SOLN
2.0000 g | Freq: Every day | INTRAVENOUS | Status: DC
  Administered 2019-06-12 – 2019-06-13 (×3): 2 g via INTRAVENOUS
  Filled 2019-06-11: qty 20
  Filled 2019-06-11 (×3): qty 2

## 2019-06-11 MED ORDER — ONDANSETRON HCL 4 MG/2ML IJ SOLN: 4.0000 mg | Freq: Four times a day (QID) | INTRAMUSCULAR | Status: DC | PRN

## 2019-06-11 MED ORDER — DILTIAZEM HCL 100 MG IV SOLR
INTRAVENOUS | Status: AC
  Filled 2019-06-11: qty 100

## 2019-06-11 MED ORDER — IOHEXOL 300 MG/ML  SOLN
100.0000 mL | Freq: Once | INTRAMUSCULAR | Status: AC | PRN
  Administered 2019-06-11: 100 mL via INTRAVENOUS

## 2019-06-11 MED ORDER — ATORVASTATIN CALCIUM 40 MG PO TABS
40.0000 mg | ORAL_TABLET | Freq: Every day | ORAL | Status: DC
  Administered 2019-06-12 – 2019-06-25 (×15): 40 mg via ORAL
  Filled 2019-06-11 (×15): qty 1

## 2019-06-11 MED ORDER — ACETAMINOPHEN 325 MG PO TABS: 650.0000 mg | ORAL_TABLET | Freq: Four times a day (QID) | ORAL | Status: DC | PRN

## 2019-06-11 MED ORDER — CYCLOBENZAPRINE HCL 10 MG PO TABS
10.0000 mg | ORAL_TABLET | Freq: Two times a day (BID) | ORAL | Status: DC | PRN
  Administered 2019-06-25 (×2): 10 mg via ORAL
  Filled 2019-06-11 (×2): qty 1

## 2019-06-11 MED ORDER — INSULIN ASPART 100 UNIT/ML ~~LOC~~ SOLN
0.0000 [IU] | SUBCUTANEOUS | Status: DC
  Administered 2019-06-12: 1 [IU] via SUBCUTANEOUS
  Administered 2019-06-12: 2 [IU] via SUBCUTANEOUS
  Administered 2019-06-12 – 2019-06-13 (×3): 1 [IU] via SUBCUTANEOUS
  Administered 2019-06-13: 2 [IU] via SUBCUTANEOUS
  Administered 2019-06-13 – 2019-06-14 (×2): 1 [IU] via SUBCUTANEOUS
  Administered 2019-06-15 – 2019-06-16 (×3): 2 [IU] via SUBCUTANEOUS
  Administered 2019-06-16: 1 [IU] via SUBCUTANEOUS
  Administered 2019-06-16: 2 [IU] via SUBCUTANEOUS
  Administered 2019-06-17: 1 [IU] via SUBCUTANEOUS
  Administered 2019-06-18: 5 [IU] via SUBCUTANEOUS
  Administered 2019-06-18: 1 [IU] via SUBCUTANEOUS
  Administered 2019-06-18 – 2019-06-19 (×5): 2 [IU] via SUBCUTANEOUS
  Administered 2019-06-19 (×2): 1 [IU] via SUBCUTANEOUS
  Administered 2019-06-20: 2 [IU] via SUBCUTANEOUS
  Administered 2019-06-20: 1 [IU] via SUBCUTANEOUS
  Administered 2019-06-20: 2 [IU] via SUBCUTANEOUS
  Administered 2019-06-20: 1 [IU] via SUBCUTANEOUS
  Administered 2019-06-21: 2 [IU] via SUBCUTANEOUS
  Administered 2019-06-21: 3 [IU] via SUBCUTANEOUS
  Administered 2019-06-21: 1 [IU] via SUBCUTANEOUS
  Administered 2019-06-21 – 2019-06-22 (×2): 2 [IU] via SUBCUTANEOUS
  Administered 2019-06-22 – 2019-06-23 (×7): 1 [IU] via SUBCUTANEOUS
  Administered 2019-06-24: 2 [IU] via SUBCUTANEOUS
  Administered 2019-06-24 – 2019-06-25 (×4): 1 [IU] via SUBCUTANEOUS
  Administered 2019-06-25: 2 [IU] via SUBCUTANEOUS
  Administered 2019-06-26: 1 [IU] via SUBCUTANEOUS
  Administered 2019-06-26: 2 [IU] via SUBCUTANEOUS

## 2019-06-11 MED ORDER — RIVAROXABAN 20 MG PO TABS
20.0000 mg | ORAL_TABLET | Freq: Every day | ORAL | Status: DC
  Administered 2019-06-12 – 2019-06-15 (×4): 20 mg via ORAL
  Filled 2019-06-11 (×4): qty 1

## 2019-06-11 MED ORDER — DILTIAZEM HCL 25 MG/5ML IV SOLN
10.0000 mg | Freq: Once | INTRAVENOUS | Status: DC
  Filled 2019-06-11: qty 5

## 2019-06-11 MED ORDER — SODIUM CHLORIDE 0.9 % IV BOLUS
500.0000 mL | Freq: Once | INTRAVENOUS | Status: AC
  Administered 2019-06-11: 500 mL via INTRAVENOUS

## 2019-06-11 MED ORDER — HYDROMORPHONE HCL 1 MG/ML IJ SOLN
0.5000 mg | Freq: Once | INTRAMUSCULAR | Status: AC
  Administered 2019-06-11: 0.5 mg via INTRAVENOUS
  Filled 2019-06-11: qty 1

## 2019-06-11 MED ORDER — LEVOTHYROXINE SODIUM 25 MCG PO TABS
125.0000 ug | ORAL_TABLET | Freq: Every day | ORAL | Status: DC
  Administered 2019-06-12 – 2019-06-26 (×13): 125 ug via ORAL
  Filled 2019-06-11 (×14): qty 1

## 2019-06-11 MED ORDER — RALOXIFENE HCL 60 MG PO TABS
60.0000 mg | ORAL_TABLET | Freq: Every day | ORAL | Status: DC
  Administered 2019-06-12 – 2019-06-25 (×15): 60 mg via ORAL
  Filled 2019-06-11 (×19): qty 1

## 2019-06-11 MED ORDER — DULOXETINE HCL 60 MG PO CPEP
60.0000 mg | ORAL_CAPSULE | Freq: Every day | ORAL | Status: DC
  Administered 2019-06-12 – 2019-06-26 (×15): 60 mg via ORAL
  Filled 2019-06-11: qty 2
  Filled 2019-06-11 (×4): qty 1
  Filled 2019-06-11: qty 2
  Filled 2019-06-11 (×2): qty 1
  Filled 2019-06-11 (×2): qty 2
  Filled 2019-06-11: qty 1
  Filled 2019-06-11 (×2): qty 2
  Filled 2019-06-11 (×2): qty 1

## 2019-06-11 MED ORDER — ACETAMINOPHEN 650 MG RE SUPP: 650.0000 mg | Freq: Four times a day (QID) | RECTAL | Status: DC | PRN

## 2019-06-11 MED ORDER — ONDANSETRON HCL 4 MG/2ML IJ SOLN
4.0000 mg | Freq: Once | INTRAMUSCULAR | Status: AC
  Administered 2019-06-11: 4 mg via INTRAVENOUS
  Filled 2019-06-11: qty 2

## 2019-06-11 MED ORDER — SODIUM CHLORIDE 0.9 % IV SOLN
INTRAVENOUS | Status: DC
  Administered 2019-06-11 (×2): via INTRAVENOUS

## 2019-06-11 NOTE — H&P (Signed)
History and Physical    Melinda Mckinney [id number] DOB: [birth year] DOA: 06/11/2019  PCP: Colon Branch, MD  Patient coming from: Home  I have personally briefly reviewed patient's old medical records in [place]  Chief Complaint: Abd pain  HPI: Melinda Mckinney is a 63 y.o. female with medical history significant of DM2, A.Fib on xarelto, CAD s/p stent.  Patient originally diagnosed with COVID 9/15.  Admitted with gastroenteritis due to COVID 10/9-10/13.  Developed worsening abd pain later in the month, CT abd/pelvis on 10/22 showed new acute diverticulitis not present on CT 10/9 (though at least the [place] findings in the lungs looked significantly better).  She was put on cipro/flagyl.  Symptoms have continued unabated.  Abd pain primarily LLQ but radiates across lower abdomen per patient.  No blood in stool.  Returns to ED today.   ED Course: Repeat CT abd/pelvis shows improved but persistent diverticulitis.  BPs soft in ED with lactate 2.7, improved with IVF, lactate down to 1.5.  Got rocephin / vanc.  A.Fib was in RVR, this improved on cardizem gtt.  Hospitalist asked to admit.   Review of Systems: As per HPI, otherwise all review of systems negative.  Past Medical History:  Diagnosis Date   Abnormal Pap smear of cervix    Anemia    Anxiety and depression    Asthma    Atrial fibrillation ([place]) 07/2017   Borderline diabetes    CAD (coronary artery disease) ~2009   stent    Cervical cancer (HCC)    "stage 1; had cryotherapy"   Cervical dysplasia    Chronic lower back pain    DDD (degenerative disc disease), lumbar    Dr. Nelva Bush, recommends lumbar epidural steroid injections L5-S1 to the right   Depression    Fibromyalgia    GERD (gastroesophageal reflux disease)    Headache    "only when I'm short of breath"  (07/27/2017)   High cholesterol    History of hiatal hernia    History of kidney stones    Hypertension    Hypothyroidism     Insomnia [id number]   Lichen sclerosus    Vulvar area   Osteoporosis    Thyroid disease    Vitamin D deficiency     Past Surgical History:  Procedure Laterality Date   CARDIAC CATHETERIZATION  ~ 2008   CARDIOVERSION N/A 08/05/2017   Procedure: CARDIOVERSION;  Surgeon: Evans Lance, MD;  Location: [facility];  Service: Cardiovascular;  Laterality: N/A;   CARDIOVERSION N/A 08/24/2017   Procedure: CARDIOVERSION;  Surgeon: Sanda Klein, MD;  Location: [facility];  Service: Cardiovascular;  Laterality: N/A;   [place]; ~ 1978/1979; 1984   ESOPHAGOMYOTOMY  2009   GYNECOLOGIC CRYOSURGERY  X 2   "stage 1 cancer"   HERNIA REPAIR  X 6   "all in my stomach" (07/27/2017)   KNEE ARTHROSCOPY Right    NEPHRECTOMY Right 1991   in [place], due to fibrosis and lithiasis   TUBAL LIGATION       reports that she quit smoking about 20 years ago. Her smoking use included cigarettes. She has a 56.00 pack-year smoking history. She has never used smokeless tobacco. She reports current alcohol use. She reports that she does not use drugs.  Allergies  Allergen Reactions   Penicillins Swelling and Rash    Has patient had a PCN reaction causing immediate rash, facial/tongue/throat swelling, SOB or lightheadedness with hypotension: No Has patient had a  PCN reaction causing severe rash involving mucus membranes or skin necrosis: No Has patient had a PCN reaction that required hospitalization: No Has patient had a PCN reaction occurring within the last 10 years: No If all of the above answers are "NO", then may proceed with Cephalosporin use.    Family History  Problem Relation Age of Onset   Breast cancer Other        aunt    CAD Brother    Lung cancer Mother        F and M   Diabetes Father        F and mother    CAD Father    Lung cancer Father    Stroke Sister    Colon cancer Neg Hx      Prior to Admission medications   Medication Sig Start Date End  Date Taking? Authorizing Provider  allopurinol (ZYLOPRIM) 100 MG tablet Take 0.5 tablets (50 mg total) by mouth daily. Patient taking differently: Take 100 mg by mouth at bedtime.  12/28/18   Colon Branch, MD  atorvastatin (LIPITOR) 40 MG tablet Take 1 tablet (40 mg total) by mouth at bedtime. 10/31/18   Colon Branch, MD  Cholecalciferol (VITAMIN D3) 5000 UNITS CAPS Take 5,000 Units by mouth daily.     [provider]  cyclobenzaprine (FLEXERIL) 10 MG tablet Take 1 tablet (10 mg total) by mouth 2 (two) times daily as needed for muscle spasms. 10/01/18   Colon Branch, MD  diltiazem (CARDIZEM) 120 MG tablet TAKE 1/2 TABLET BY MOUTH TWICE DAILY Patient taking differently: Take 60 mg by mouth 2 (two) times daily.  03/29/19   Colon Branch, MD  DULoxetine (CYMBALTA) 60 MG capsule Take 1 capsule (60 mg total) by mouth daily. 01/28/19   Colon Branch, MD  furosemide (LASIX) 20 MG tablet Take 1 tablet (20 mg total) by mouth daily. 12/17/18   Colon Branch, MD  levothyroxine (SYNTHROID, LEVOTHROID) 125 MCG tablet TAKE ONE TABLET BY MOUTH DAILY BEFORE BREAKFAST Patient taking differently: Take 125 mcg by mouth daily before breakfast.  09/12/18   Philemon Kingdom, MD  lisinopril (ZESTRIL) 10 MG tablet Take 1 tablet (10 mg total) by mouth daily. 01/23/19   Colon Branch, MD  metFORMIN (GLUCOPHAGE) 500 MG tablet Take 1 tablet (500 mg total) by mouth 2 (two) times daily with a meal. 06/10/19   Colon Branch, MD  omeprazole (PRILOSEC) 20 MG capsule Take 1 capsule (20 mg total) by mouth 2 (two) times daily before a meal. 06/10/19   Colon Branch, MD  PRESCRIPTION MEDICATION 4 g See admin instructions. Testosterone 2 % cream  APPLY FOUR CLICKS (ONE GRAM) TO INNER THIGH DAILY. ROTATE    [provider]  raloxifene (EVISTA) 60 MG tablet Take 1 tablet (60 mg total) by mouth daily. Patient taking differently: Take 60 mg by mouth at bedtime.  09/08/16   Terrance Mass, MD  rivaroxaban (XARELTO) 20 MG TABS tablet Take 1  tablet (20 mg total) by mouth daily with supper. 11/05/18   Colon Branch, MD  SOTALOL AF 120 MG TABS TAKE ONE TABLET BY MOUTH EVERY 12 HOURS Patient taking differently: Take 120 mg by mouth every 12 (twelve) hours.  10/02/18   Allred, Jeneen Rinks, MD  zolpidem (AMBIEN) 5 MG tablet Take 1 tablet (5 mg total) by mouth at bedtime as needed. for sleep Patient taking differently: Take 5 mg by mouth at bedtime as needed for  sleep. for sleep 01/11/19   Colon Branch, MD    Physical Exam: Vitals:   06/11/19 1930 06/11/19 2030 06/11/19 2045 06/11/19 2200  BP: 112/78 99/75 95/63  118/77  Pulse: (!) 28  93 83  Resp: 18 18 20    Temp:   98.4 F (36.9 C) 98.8 F (37.1 C)  TempSrc:   Oral Oral  SpO2: 98%  98% 95%  Weight:      Height:        Constitutional: NAD, calm, comfortable Eyes: PERRL, lids and conjunctivae normal ENMT: Mucous membranes are moist. Posterior pharynx clear of any exudate or lesions.Normal dentition.  Neck: normal, supple, no masses, no thyromegaly Respiratory: clear to auscultation bilaterally, no wheezing, no crackles. Normal respiratory effort. No accessory muscle use.  Cardiovascular: IRR, IRR Abdomen: LLQ TTP Musculoskeletal: no clubbing / cyanosis. No joint deformity upper and lower extremities. Good ROM, no contractures. Normal muscle tone.  Skin: no rashes, lesions, ulcers. No induration Neurologic: CN 2-12 grossly intact. Sensation intact, DTR normal. Strength 5/5 in all 4.  Psychiatric: Normal judgment and insight. Alert and oriented x 3. Normal mood.    Labs on Admission: I have personally reviewed following labs and imaging studies  CBC: Recent Labs  Lab 06/06/19 1010 06/11/19 1024  WBC 9.4 9.0  NEUTROABS 6.6 6.8  HGB 11.7* 11.7*  HCT 36.8 39.1  MCV 89.0 92.9  PLT 453.0* [id number]   Basic Metabolic Panel: Recent Labs  Lab 06/06/19 1010 06/11/19 1024  NA 138 138  K 4.1 4.0  CL 103 104  CO2 25 22  GLUCOSE 199* 240*  BUN 10 17  CREATININE 0.77 1.13*  CALCIUM  8.8 9.7   GFR: Estimated Creatinine Clearance: 63.5 mL/min (A) (by C-G formula based on SCr of 1.13 mg/dL (H)). Liver Function Tests: Recent Labs  Lab 06/06/19 1010 06/11/19 1024  AST 11 14*  ALT 10 8  ALKPHOS 108 88  BILITOT 0.7 0.6  PROT 6.4 6.9  ALBUMIN 3.5 4.0   Recent Labs  Lab 06/11/19 1024  LIPASE 25   No results for input(s): AMMONIA in the last 168 hours. Coagulation Profile: Recent Labs  Lab 06/11/19 1024  INR 1.6*   Cardiac Enzymes: No results for input(s): CKTOTAL, CKMB, CKMBINDEX, TROPONINI in the last 168 hours. BNP (last 3 results) No results for input(s): PROBNP in the last 8760 hours. HbA1C: No results for input(s): HGBA1C in the last 72 hours. CBG: No results for input(s): GLUCAP in the last 168 hours. Lipid Profile: No results for input(s): CHOL, HDL, LDLCALC, TRIG, CHOLHDL, LDLDIRECT in the last 72 hours. Thyroid Function Tests: No results for input(s): TSH, T4TOTAL, FREET4, T3FREE, THYROIDAB in the last 72 hours. Anemia Panel: No results for input(s): VITAMINB12, FOLATE, FERRITIN, TIBC, IRON, RETICCTPCT in the last 72 hours. Urine analysis:    Component Value Date/Time   COLORURINE AMBER (A) 06/11/2019 1526   APPEARANCEUR CLEAR 06/11/2019 1526   LABSPEC >1.046 (H) 06/11/2019 1526   PHURINE 6.0 06/11/2019 1526   GLUCOSEU NEGATIVE 06/11/2019 1526   GLUCOSEU NEGATIVE 06/06/2019 1538   HGBUR SMALL (A) 06/11/2019 1526   BILIRUBINUR NEGATIVE 06/11/2019 1526   KETONESUR NEGATIVE 06/11/2019 1526   PROTEINUR 30 (A) 06/11/2019 1526   UROBILINOGEN 0.2 06/06/2019 1538   NITRITE NEGATIVE 06/11/2019 1526   LEUKOCYTESUR TRACE (A) 06/11/2019 1526    Radiological Exams on Admission: Ct Abdomen Pelvis W Contrast  Result Date: 06/11/2019 CLINICAL DATA:  Left lower quadrant pain, bloating EXAM: CT ABDOMEN AND PELVIS  WITH CONTRAST TECHNIQUE: Multidetector CT imaging of the abdomen and pelvis was performed using the standard protocol following bolus  administration of intravenous contrast. CONTRAST:  137mL OMNIPAQUE IOHEXOL 300 MG/ML  SOLN COMPARISON:  06/06/2019 FINDINGS: Lower chest: Mild peripheral airspace opacities noted in the lower lungs. No visible effusions. Heart is normal size. Hepatobiliary: Diffuse low-density throughout the liver compatible with fatty infiltration. No focal abnormality. Gallbladder unremarkable. Pancreas: No focal abnormality or ductal dilatation. Spleen: No focal abnormality.  Normal size. Adrenals/Urinary Tract: Prior right nephrectomy. Left kidney unremarkable. No hydronephrosis. Adrenal glands and urinary bladder unremarkable. Stomach/Bowel: Sigmoid and descending colonic diverticulosis. Mild inflammatory stranding adjacent to the proximal sigmoid colon which has decreased since prior study and compatible with improving diverticulitis. Small bowel decompressed as is the stomach. No bowel obstruction. Vascular/Lymphatic: No evidence of aneurysm or adenopathy. Reproductive: Uterus and adnexa unremarkable.  No mass. Other: No free fluid or free air. Musculoskeletal: No acute bony abnormality. IMPRESSION: Improving but not completely resolved diverticulitis changes in the proximal sigmoid colon. Fatty infiltration of the liver. Mild peripheral opacities in the visualized lower lungs, similar to prior study. No effusions. Electronically Signed   By: Rolm Baptise M.D.   On: 06/11/2019 11:06   Dg Chest Port 1 View  Result Date: 06/11/2019 CLINICAL DATA:  Positive COVID-19 test.  Abdominal pain. EXAM: PORTABLE CHEST 1 VIEW COMPARISON:  06/06/2019. FINDINGS: Stable cardiomegaly with normal pulmonary vascularity. Chronic bilateral pulmonary interstitial prominence noted. Findings consistent chronic interstitial lung disease. No acute abnormality identified. No prominent pleural effusion. No pneumothorax. IMPRESSION: 1.  Stable cardiomegaly. 2. Chronic bilateral pulmonary interstitial prominence consistent chronic interstitial lung  disease again noted. No acute abnormality identified. Electronically Signed   By: Marcello Moores  Register   On: 06/11/2019 12:03    EKG: Independently reviewed.  Assessment/Plan Principal Problem:   Sepsis ([place]) Active Problems:   Essential hypertension   Coronary artery disease involving native coronary artery of native heart with angina pectoris (HCC)   DM2 (diabetes mellitus, type 2) (HCC)   Atrial fibrillation with rapid ventricular response (HCC)   Chronic systolic CHF (congestive heart failure) ([place])   COVID-19   Diverticulitis    1. Sepsis due to diverticulitis - 1. IVF: bolus in ED + 125 cc/hr NS for now 2. Cont rocephin, dose at 2g per day given pt weight 3. Flagyl IV 4. Stopping Vanc 5. BCx pending 6. Tele monitor 7. NPO for now 2. HTN - 1. Hold lisinopril due to soft BPs in ED 3. A.Fib RVR - 1. Holding cardizem PO and instead using cardizem gtt 2. Cont Sotalol 3. Cont Xarelto 4. Tele monitor 4. Chronic systolic CHF - 1. Hold home lasix 2. Watch for overload with IVF being given for sepsis as above 5. CAD - 1. Cont statin, BB 2. Holding ACEi 6. DM2 - 1. Hold metformin 2. Use sensitive SSI Q4H 7. COVID-19 1. Essentially resolved at this point, with improvement / resolution of PNA findings at lung bases.  DVT prophylaxis: Xarelto Code Status: Full Family Communication: No family in room Disposition Plan: Home after admit Consults called: None Admission status: Admit to inpatient  Severity of Illness: The appropriate patient status for this patient is INPATIENT. Inpatient status is judged to be reasonable and necessary in order to provide the required intensity of service to ensure the patient's safety. The patient's presenting symptoms, physical exam findings, and initial radiographic and laboratory data in the context of their chronic comorbidities is felt to place them at high  risk for further clinical deterioration. Furthermore, it is not anticipated that the  patient will be medically stable for discharge from the hospital within 2 midnights of admission. The following factors support the patient status of inpatient.   IP status for: 1) diverticulitis, failed outpt treatment, now with mild sepsis 2) A.Fib RVR requiring cardizem  * I certify that at the point of admission it is my clinical judgment that the patient will require inpatient hospital care spanning beyond 2 midnights from the point of admission due to high intensity of service, high risk for further deterioration and high frequency of surveillance required.*    Sharline Lehane M. DO [facility]  How to contact the [facility] Attending or Consulting provider [place] or covering provider during after hours [place], for this patient?  1. Check the care team in [facility] and look for a) attending/consulting [facility] provider listed and b) the [facility] team listed 2. Log into [email or web address]  Amion Physician Scheduling and messaging for groups and whole hospitals  On call and physician scheduling software for group practices, residents, hospitalists and other medical providers for call, clinic, rotation and shift schedules. On[employer] is a hospital-wide system for scheduling doctors and paging doctors on call. EasyPlot is for scientific plotting and data analysis.  [email or web address]  and use [facility]'s universal password to access. If you do not have the password, please contact the hospital operator.  3. Locate the [facility] provider you are looking for under [facility] and page to a number that you can be directly reached. 4. If you still have difficulty reaching the provider, please page the [facility] (Director on Call) for the Hospitalists listed on amion for assistance.  06/11/2019, 11:24 PM

## 2019-06-11 NOTE — ED Notes (Addendum)
Pt reports that when she was initially diagnosed with diverticulitis, pain was left lower abdomen, now has moved to right lower abdomen.   Has been taking cipro and flagyl for one week as prescribed.  Denies loose stool. Both pt and husband tested negative for covid as of October 5th

## 2019-06-11 NOTE — Progress Notes (Signed)
Pt arrived from [facility]. Pt alert and oriented. Pt has belongings at beside to include phone, wallet, phone charger, glasses, and clothing.

## 2019-06-11 NOTE — ED Notes (Signed)
Pt returned from CT.  Reports relief from pain following medication.  EKG performed

## 2019-06-11 NOTE — ED Provider Notes (Addendum)
[facility] EMERGENCY DEPARTMENT Provider Note   CSN: [id number] Arrival date & time: 06/11/19  [id number]     History   Chief Complaint Chief Complaint  Patient presents with  . Abdominal Pain  . Emesis    HPI Melinda Mckinney is a 63 y.o. female.     HPI  Past Medical History:  Diagnosis Date  . Abnormal Pap smear of cervix   . Anemia   . Anxiety and depression   . Asthma   . Atrial fibrillation ([place]) 07/2017  . Borderline diabetes   . CAD (coronary artery disease) ~2009   stent   . Cervical cancer ([place])    "stage 1; had cryotherapy"  . Cervical dysplasia   . Chronic lower back pain   . DDD (degenerative disc disease), lumbar    Dr. Nelva Bush, recommends lumbar epidural steroid injections L5-S1 to the right  . Depression   . Fibromyalgia   . GERD (gastroesophageal reflux disease)   . Headache    "only when I'm short of breath"  (07/27/2017)  . High cholesterol   . History of hiatal hernia   . History of kidney stones   . Hypertension   . Hypothyroidism   . Insomnia 09/27/2013  . Lichen sclerosus    Vulvar area  . Osteoporosis   . Thyroid disease   . Vitamin D deficiency     Patient Active Problem List   Diagnosis Date Noted  . Pneumonia due to COVID-19 virus 05/25/2019  . Hypokalemia 05/25/2019  . Hypomagnesemia 05/25/2019  . COVID-19 05/01/2019  . 2019 novel coronavirus disease (COVID-19) 04/30/2019  . Encounter for monitoring sotalol therapy 08/03/2017  . Chronic systolic CHF (congestive heart failure) ([place]) 07/27/2017  . Acute congestive heart failure ([place]) 07/27/2017  . Precordial pain   . Atrial fibrillation with RVR ([place]) 06/29/2017  . Hyperglycemia 01/06/2017  . PCP NOTES >>>> 06/16/2015  . Back pain 02/05/2015  . Hypothyroidism 01/19/2015  . Vitamin D deficiency   . Vulvar atrophy 08/20/2014  . Annual physical exam 07/24/2014  . Anemia 07/24/2014  . Insomnia 09/27/2013  . Intertrigo 09/27/2013  . Urolithiasis 09/27/2013  .  Essential hypertension   . GERD, h/o achalasia s/p heller ~2009)   . Hyperlipidemia LDL goal <70   . Coronary artery disease involving native coronary artery of native heart with angina pectoris ([place])   . Osteoporosis   . Fibromyalgia     Past Surgical History:  Procedure Laterality Date  . CARDIAC CATHETERIZATION  ~ 2008  . CARDIOVERSION N/A 08/05/2017   Procedure: CARDIOVERSION;  Surgeon: Evans Lance, MD;  Location: [facility];  Service: Cardiovascular;  Laterality: N/A;  . CARDIOVERSION N/A 08/24/2017   Procedure: CARDIOVERSION;  Surgeon: Sanda Klein, MD;  Location: [facility];  Service: Cardiovascular;  Laterality: N/A;  . [place]; ~ 1978/1979; 1984  . ESOPHAGOMYOTOMY  2009  . GYNECOLOGIC CRYOSURGERY  X 2   "stage 1 cancer"  . HERNIA REPAIR  X 6   "all in my stomach" (07/27/2017)  . KNEE ARTHROSCOPY Right   . NEPHRECTOMY Right 1991   in [place], due to fibrosis and lithiasis  . TUBAL LIGATION       OB History    Gravida  4   Para  2   Term      Preterm      AB  2   Living  2     SAB  2   TAB  Ectopic      Multiple      Live Births               Home Medications    Prior to Admission medications   Medication Sig Start Date End Date Taking? Authorizing Provider  allopurinol (ZYLOPRIM) 100 MG tablet Take 0.5 tablets (50 mg total) by mouth daily. Patient taking differently: Take 100 mg by mouth at bedtime.  12/28/18   Colon Branch, MD  atorvastatin (LIPITOR) 40 MG tablet Take 1 tablet (40 mg total) by mouth at bedtime. 10/31/18   Colon Branch, MD  Cholecalciferol (VITAMIN D3) 5000 UNITS CAPS Take 5,000 Units by mouth daily.     [provider]  ciprofloxacin (CIPRO) 500 MG tablet Take 1 tablet (500 mg total) by mouth 2 (two) times daily. 06/06/19   Colon Branch, MD  cyclobenzaprine (FLEXERIL) 10 MG tablet Take 1 tablet (10 mg total) by mouth 2 (two) times daily as needed for muscle spasms. 10/01/18   Colon Branch, MD   diltiazem (CARDIZEM) 120 MG tablet TAKE 1/2 TABLET BY MOUTH TWICE DAILY Patient taking differently: Take 60 mg by mouth 2 (two) times daily.  03/29/19   Colon Branch, MD  DULoxetine (CYMBALTA) 60 MG capsule Take 1 capsule (60 mg total) by mouth daily. 01/28/19   Colon Branch, MD  furosemide (LASIX) 20 MG tablet Take 1 tablet (20 mg total) by mouth daily. 12/17/18   Colon Branch, MD  levothyroxine (SYNTHROID, LEVOTHROID) 125 MCG tablet TAKE ONE TABLET BY MOUTH DAILY BEFORE BREAKFAST Patient taking differently: Take 125 mcg by mouth daily before breakfast.  09/12/18   Philemon Kingdom, MD  lisinopril (ZESTRIL) 10 MG tablet Take 1 tablet (10 mg total) by mouth daily. 01/23/19   Colon Branch, MD  metFORMIN (GLUCOPHAGE) 500 MG tablet Take 1 tablet (500 mg total) by mouth 2 (two) times daily with a meal. 06/10/19   Colon Branch, MD  metroNIDAZOLE (FLAGYL) 500 MG tablet Take 1 tablet (500 mg total) by mouth 3 (three) times daily. 06/06/19   Colon Branch, MD  omeprazole (PRILOSEC) 20 MG capsule Take 1 capsule (20 mg total) by mouth 2 (two) times daily before a meal. 06/10/19   Colon Branch, MD  PRESCRIPTION MEDICATION 4 g See admin instructions. Testosterone 2 % cream  APPLY FOUR CLICKS (ONE GRAM) TO INNER THIGH DAILY. ROTATE    [provider]  raloxifene (EVISTA) 60 MG tablet Take 1 tablet (60 mg total) by mouth daily. Patient taking differently: Take 60 mg by mouth at bedtime.  09/08/16   Terrance Mass, MD  rivaroxaban (XARELTO) 20 MG TABS tablet Take 1 tablet (20 mg total) by mouth daily with supper. 11/05/18   Colon Branch, MD  SOTALOL AF 120 MG TABS TAKE ONE TABLET BY MOUTH EVERY 12 HOURS Patient taking differently: Take 120 mg by mouth every 12 (twelve) hours.  10/02/18   Allred, Jeneen Rinks, MD  zolpidem (AMBIEN) 5 MG tablet Take 1 tablet (5 mg total) by mouth at bedtime as needed. for sleep Patient taking differently: Take 5 mg by mouth at bedtime as needed for sleep. for sleep 01/11/19   Colon Branch, MD     Family History Family History  Problem Relation Age of Onset  . Breast cancer Other        aunt   . CAD Brother   . Lung cancer Mother  F and M  . Diabetes Father        F and mother   . CAD Father   . Lung cancer Father   . Stroke Sister   . Colon cancer Neg Hx     Social History Social History   Tobacco Use  . Smoking status: Former Smoker    Packs/day: 2.00    Years: 28.00    Pack years: 56.00    Types: Cigarettes    Quit date: 2000    Years since quitting: 20.8  . Smokeless tobacco: Never Used  Substance Use Topics  . Alcohol use: Yes    Comment: 07/27/2017 "3-4 drinks//month"  . Drug use: No     Allergies   Penicillins   Review of Systems Review of Systems  Constitutional: Negative for chills and fever.  HENT: Negative for congestion, rhinorrhea and sore throat.   Eyes: Negative for visual disturbance.  Respiratory: Negative for cough and shortness of breath.   Cardiovascular: Positive for palpitations. Negative for chest pain and leg swelling.  Gastrointestinal: Positive for abdominal pain, nausea and vomiting. Negative for diarrhea.  Genitourinary: Negative for dysuria.  Musculoskeletal: Negative for back pain and neck pain.  Skin: Negative for rash.  Neurological: Negative for dizziness, light-headedness and headaches.  Hematological: Does not bruise/bleed easily.  Psychiatric/Behavioral: Negative for confusion.     Physical Exam Updated Vital Signs BP 110/84   Pulse 76   Temp 98.2 F (36.8 C) (Oral)   Resp 20   Ht 1.651 m (5\' 5" )   Wt 117 kg   SpO2 95%   BMI 42.92 kg/m   Physical Exam Vitals signs and nursing note reviewed.  Constitutional:      General: She is not in acute distress.    Appearance: Normal appearance. She is well-developed.  HENT:     Head: Normocephalic and atraumatic.  Eyes:     Extraocular Movements: Extraocular movements intact.     Conjunctiva/sclera: Conjunctivae normal.     Pupils: Pupils are  equal, round, and reactive to light.  Neck:     Musculoskeletal: Normal range of motion and neck supple.  Cardiovascular:     Rate and Rhythm: Normal rate and regular rhythm.     Heart sounds: No murmur.  Pulmonary:     Effort: Pulmonary effort is normal. No respiratory distress.     Breath sounds: Normal breath sounds.  Abdominal:     Palpations: Abdomen is soft.     Tenderness: There is abdominal tenderness.     Comments: Patient with some tenderness left lower quadrant and right lower quadrant.  Musculoskeletal: Normal range of motion.  Skin:    General: Skin is warm and dry.  Neurological:     General: No focal deficit present.     Mental Status: She is alert and oriented to person, place, and time.     Cranial Nerves: No cranial nerve deficit.     Sensory: No sensory deficit.     Motor: No weakness.      ED Treatments / Results  Labs (all labs ordered are listed, but only abnormal results are displayed) Labs Reviewed  COMPREHENSIVE METABOLIC PANEL - Abnormal; Notable for the following components:      Result Value   Glucose, Bld 240 (*)    Creatinine, Ser 1.13 (*)    AST 14 (*)    GFR calc non Af Amer 51 (*)    GFR calc Af Amer 59 (*)    All  other components within normal limits  CBC WITH DIFFERENTIAL/PLATELET - Abnormal; Notable for the following components:   Hemoglobin 11.7 (*)    MCHC 29.9 (*)    RDW 16.2 (*)    Abs Immature Granulocytes 0.09 (*)    All other components within normal limits  CULTURE, BLOOD (ROUTINE X 2)  CULTURE, BLOOD (ROUTINE X 2)  LIPASE, BLOOD  LACTIC ACID, PLASMA    EKG None   ED ECG REPORT   Date: 06/11/2019  Rate: 147  Rhythm: atrial fibrillation  QRS Axis: normal  Intervals: QT prolonged  ST/T Wave abnormalities: nonspecific T wave changes  Conduction Disutrbances:none  Narrative Interpretation:   Old EKG Reviewed: none available  I have personally reviewed the EKG tracing and agree with the computerized printout as  noted. EKG also with paired ventricular premature complexes.  And also borderline T wave abnormalities.  Radiology Ct Abdomen Pelvis W Contrast  Result Date: 06/11/2019 CLINICAL DATA:  Left lower quadrant pain, bloating EXAM: CT ABDOMEN AND PELVIS WITH CONTRAST TECHNIQUE: Multidetector CT imaging of the abdomen and pelvis was performed using the standard protocol following bolus administration of intravenous contrast. CONTRAST:  144mL OMNIPAQUE IOHEXOL 300 MG/ML  SOLN COMPARISON:  06/06/2019 FINDINGS: Lower chest: Mild peripheral airspace opacities noted in the lower lungs. No visible effusions. Heart is normal size. Hepatobiliary: Diffuse low-density throughout the liver compatible with fatty infiltration. No focal abnormality. Gallbladder unremarkable. Pancreas: No focal abnormality or ductal dilatation. Spleen: No focal abnormality.  Normal size. Adrenals/Urinary Tract: Prior right nephrectomy. Left kidney unremarkable. No hydronephrosis. Adrenal glands and urinary bladder unremarkable. Stomach/Bowel: Sigmoid and descending colonic diverticulosis. Mild inflammatory stranding adjacent to the proximal sigmoid colon which has decreased since prior study and compatible with improving diverticulitis. Small bowel decompressed as is the stomach. No bowel obstruction. Vascular/Lymphatic: No evidence of aneurysm or adenopathy. Reproductive: Uterus and adnexa unremarkable.  No mass. Other: No free fluid or free air. Musculoskeletal: No acute bony abnormality. IMPRESSION: Improving but not completely resolved diverticulitis changes in the proximal sigmoid colon. Fatty infiltration of the liver. Mild peripheral opacities in the visualized lower lungs, similar to prior study. No effusions. Electronically Signed   By: Rolm Baptise M.D.   On: 06/11/2019 11:06    Procedures Procedures (including critical care time)  CRITICAL CARE Performed by: Fredia Sorrow Total critical care time: 30 minutes Critical care  time was exclusive of separately billable procedures and treating other patients. Critical care was necessary to treat or prevent imminent or life-threatening deterioration. Critical care was time spent personally by me on the following activities: development of treatment plan with patient and/or surrogate as well as nursing, discussions with consultants, evaluation of patient's response to treatment, examination of patient, obtaining history from patient or surrogate, ordering and performing treatments and interventions, ordering and review of laboratory studies, ordering and review of radiographic studies, pulse oximetry and re-evaluation of patient's condition.   Medications Ordered in ED Medications  0.9 %  sodium chloride infusion ( Intravenous New Bag/Given 06/11/19 1120)  cefTRIAXone (ROCEPHIN) 1 g in sodium chloride 0.9 % 100 mL IVPB (has no administration in time range)  diltiazem (CARDIZEM) 100 mg in dextrose 5 % 100 mL (1 mg/mL) infusion (5 mg/hr Intravenous New Bag/Given 06/11/19 1147)  diltiazem (CARDIZEM) 100 MG injection (  Not Given 06/11/19 1148)  ondansetron (ZOFRAN) injection 4 mg (4 mg Intravenous Given 06/11/19 1029)  sodium chloride 0.9 % bolus 500 mL (0 mLs Intravenous Stopped 06/11/19 1119)  HYDROmorphone (DILAUDID) injection 0.5  mg (0.5 mg Intravenous Given 06/11/19 1031)  iohexol (OMNIPAQUE) 300 MG/ML solution 100 mL (100 mLs Intravenous Contrast Given 06/11/19 1041)     Initial Impression / Assessment and Plan / ED Course  I have reviewed the triage vital signs and the nursing notes.  Pertinent labs & imaging results that were available during my care of the patient were reviewed by me and considered in my medical decision making (see chart for details).       Patient with recent admission to [facility] for Covid infection October 9 through October 13.  Patient also known history of atrial fibrillation.  Supposed be taken diltiazem a.m. 60 mg twice a day.  Did  not take that today.  Patient with diagnosis by CAT scan on October 22 of diverticulitis without complications.  Patient was started on Cipro and Flagyl.  But never got better actually today felt worse.  Initially upon arrival patient blood pressure was 119/83 heart rate was 58 temp was 98.2 oxygen sats 98%.  Patient without any respiratory complaints.  Patient with known history of atrial fib patient is also on Xarelto.  Patient had CT scan of abdomen done which showed persistent diverticulitis without any complicating factors.  Chest x-ray showed no acute pulmonary. disease.  But CT scan did on the lower part of the lung did raise concerns for persistent infection may be from Covid.  Patient's heart rate shortly thereafter that on the EKG which was after she had been here for a while showed rapid atrial fibrillation with heart rate as high as 170.  Patient's blood pressures at that time were low 90s.  So started just on drip.  Not given a bolus.  Titrated.  Patient also received 500 cc normal saline.  Never was truly hypotensive so did not receive full fluid challenge.  Patient was started on Rocephin since she had already been on Cipro and Flagyl for the diverticulitis.  Lactic acid was ordered.  Came in above 2 but less than 3 certainly less than 4.  Patient responded very well to the diltiazem drip heart rate now down around 100.  Systolic pressures up to [id number].  Had considered starting formal sepsis order set but it was not clear exactly when she came in and did not meet specific criteria for sepsis.  In discussion with hospitalist patient will be readmitted to [facility].  They want to go ahead and formalize a sepsis order set.  I will also add on vancomycin to her antibiotic regiment.  Repeated Covid testing.  Overall patient feeling much better.  Patient also received some hydromorphone for the pain and Zofran.  Patient will be a progressive admission at [facility].   Final Clinical  Impressions(s) / ED Diagnoses   Final diagnoses:  Diverticulitis  History of 2019 novel coronavirus disease (COVID-19)  Atrial fibrillation with RVR [facility])    ED Discharge Orders    None       Fredia Sorrow, MD 06/11/19 1416   Addendum just got notified by hospitalist patient is out of the window to go back to [facility].  Because is more than 14 days from the first time of her diagnosis.  So patient will be admitted at [facility] long.  Covid testing not required.   Fredia Sorrow, MD 06/11/19 1440

## 2019-06-11 NOTE — ED Notes (Signed)
Pt changed into gown for provider exam.

## 2019-06-11 NOTE — ED Notes (Signed)
Patient transported to CT 

## 2019-06-11 NOTE — ED Notes (Signed)
IV attempt unsuccessful x3, additional staff to attempt, Dr Venita Sheffield aware of delay in starting antibiotics

## 2019-06-11 NOTE — ED Triage Notes (Signed)
Pt s/p covid one month ago.  Beginning one week ago, abdominal pain, bloating, nausea, vomiting.  Went to PMD had ct scan, diagnosed with diverticulitis, prescribed antibiotics, taking for one week.  Today reports weakness, nausea, vomiting.  Denies fever

## 2019-06-11 NOTE — Progress Notes (Addendum)
Pharmacy Antibiotic Note  Melinda Mckinney is a 63 y.o. female admitted on 06/11/2019 with sepsis.  Pharmacy has been consulted for vancomycin dosing.  Plan: Vancomycin load = 2 grams then 2500 mg every 24 hours (start 20 hrs after load)  Target: AUC 520  Height: 5\' 5"  (165.1 cm) Weight: 257 lb 15 oz (117 kg) IBW/kg (Calculated) : 57  Temp (24hrs), Avg:98.2 F (36.8 C), Min:98.2 F (36.8 C), Max:98.2 F (36.8 C)  Recent Labs  Lab 06/06/19 1010 06/11/19 1024 06/11/19 1205  WBC 9.4 9.0  --   CREATININE 0.77 1.13*  --   LATICACIDVEN  --   --  2.9*    Estimated Creatinine Clearance: 63.5 mL/min (A) (by C-G formula based on SCr of 1.13 mg/dL (H)).    Allergies  Allergen Reactions  . Penicillins Swelling and Rash    Has patient had a PCN reaction causing immediate rash, facial/tongue/throat swelling, SOB or lightheadedness with hypotension: No Has patient had a PCN reaction causing severe rash involving mucus membranes or skin necrosis: No Has patient had a PCN reaction that required hospitalization: No Has patient had a PCN reaction occurring within the last 10 years: No If all of the above answers are "NO", then may proceed with Cephalosporin use.    Antimicrobials this admission: Ceftriaxone 1 gm >>   Vancomycin 2 gram total >>    Microbiology results:  CTX Pending  Recent hospitalization at [facility], discharged 22 Oct.   Thank you for allowing pharmacy to be a part of this patient's care.  Mallie Mussel A Rigoberto Repass 06/11/2019 2:47 PM

## 2019-06-11 NOTE — Progress Notes (Addendum)
Acceptance note:  Spoke with emergency room physician, Dr. Aundria Mems.  63 year old female discharged approximately 2 weeks ago for Covid pneumonia from [facility].  Since then developed diverticulitis being treated with Cipro and Flagyl with no improvement in symptoms.  Presented to the emergency room today with soft blood pressures which responded well to IV fluids.  Also noted to have lactic acid level of 2.7.  Received IV fluids and antibiotics ordered.  Blood cultures pending.  Suspect that this is sepsis.  Also has chronic history of atrial fibrillation, and rapid ventricular rate requiring Cardizem.  Given that patient was discharged 14 days ago for moderate symptoms (4 days at [facility]), reviewed protocol with medical director and patient is considered part of general patient population, and that she is no longer in any type of isolation restrictions.  She would not be a candidate for [facility] and should not have COVID-19 test repeated.

## 2019-06-11 NOTE — ED Notes (Signed)
Report called to receiving unit, spoke to [employer]

## 2019-06-12 DIAGNOSIS — K5792 Diverticulitis of intestine, part unspecified, without perforation or abscess without bleeding: Secondary | ICD-10-CM

## 2019-06-12 LAB — CBC
HCT: 37.2 % (ref 36.0–46.0)
Hemoglobin: 10.8 g/dL — ABNORMAL LOW (ref 12.0–15.0)
MCH: 28 pg (ref 26.0–34.0)
MCHC: 29 g/dL — ABNORMAL LOW (ref 30.0–36.0)
MCV: 96.4 fL (ref 80.0–100.0)
Platelets: 300 10*3/uL (ref 150–400)
RBC: 3.86 MIL/uL — ABNORMAL LOW (ref 3.87–5.11)
RDW: 16.5 % — ABNORMAL HIGH (ref 11.5–15.5)
WBC: 7.4 10*3/uL (ref 4.0–10.5)
nRBC: 0.3 % — ABNORMAL HIGH (ref 0.0–0.2)

## 2019-06-12 LAB — GLUCOSE, CAPILLARY
Glucose-Capillary: 111 mg/dL — ABNORMAL HIGH (ref 70–99)
Glucose-Capillary: 114 mg/dL — ABNORMAL HIGH (ref 70–99)
Glucose-Capillary: 118 mg/dL — ABNORMAL HIGH (ref 70–99)
Glucose-Capillary: 121 mg/dL — ABNORMAL HIGH (ref 70–99)
Glucose-Capillary: 140 mg/dL — ABNORMAL HIGH (ref 70–99)
Glucose-Capillary: 144 mg/dL — ABNORMAL HIGH (ref 70–99)
Glucose-Capillary: 183 mg/dL — ABNORMAL HIGH (ref 70–99)

## 2019-06-12 LAB — BASIC METABOLIC PANEL
Anion gap: 10 (ref 5–15)
BUN: 14 mg/dL (ref 8–23)
CO2: 20 mmol/L — ABNORMAL LOW (ref 22–32)
Calcium: 8.4 mg/dL — ABNORMAL LOW (ref 8.9–10.3)
Chloride: 106 mmol/L (ref 98–111)
Creatinine, Ser: 0.84 mg/dL (ref 0.44–1.00)
GFR calc Af Amer: >60 mL/min (ref >60)
GFR calc non Af Amer: >60 mL/min (ref >60)
Glucose, Bld: 123 mg/dL — ABNORMAL HIGH (ref 70–99)
Potassium: 3.8 mmol/L (ref 3.5–5.1)
Sodium: 136 mmol/L (ref 135–145)

## 2019-06-12 MED ORDER — DILTIAZEM HCL 60 MG PO TABS
60.0000 mg | ORAL_TABLET | Freq: Two times a day (BID) | ORAL | Status: DC
  Administered 2019-06-12 – 2019-06-13 (×4): 60 mg via ORAL
  Filled 2019-06-12 (×4): qty 1

## 2019-06-12 NOTE — Progress Notes (Signed)
PROGRESS NOTE    Melinda Mckinney  HBZ:[id number] DOB: Nov 07, 1955 DOA: 06/11/2019 PCP: Colon Branch, MD   Brief Narrative: Melinda Mckinney is a 63 y.o. female with a history of atrial fibrillation on Xarelto, CAD s/p stent, hypertension, systolic heart failure. Patient presented secondary to abdominal pain and found to have diverticulitis, failing outpatient treatment. Antibiotics started. Also found to have atrial fibrillation with RVR.   Assessment & Plan:   Principal Problem:   Sepsis ([place]) Active Problems:   Essential hypertension   Coronary artery disease involving native coronary artery of native heart with angina pectoris (HCC)   Atrial fibrillation with rapid ventricular response (HCC)   Chronic systolic CHF (congestive heart failure) ([place])   COVID-19   Diverticulitis   Diverticulitis Failed outpatient treatment with ciprofloxacin/flagyl. Started on Ceftriaxone/Flagyl on admission. Does not appear to have met sepsis criteria on admission. -Continue Ceftriaxone/Flagyl; transition to oral pending blood cultures -Blood cultures pending  Essential hypertension Blood pressure soft. -Watch BP overnight in SDU secondary to restarting home antiarrhythmic regimen  Pre-diabetes No evidence of diabetes on chart review. Highest hemoglobin A1C is 6.1%. Recommend diet modification.  Atrial fibrillation with RVR Patient managed on diltiazem drip overnight. Sotalol continued. Rate improved today. -Continue sotalol; transition to home oral diltiazem -Continue Xarelto  Chronic systolic heart failure Stable and euvolemic.   Hypothyroidism Recent TSH of 1.6 -Continue Synthroid  Recent COVID-19 pneumonia Symptoms improved. Still with some dyspnea on exertion but much improved from weeks prior.   DVT prophylaxis: Xarelto Code Status:   Code Status: Full Code Family Communication: None at bedside Disposition Plan: Discharge home likely in 24 to 48 hours pending control of heart rate  and infection   Consultants:   None  Procedures:   None  Antimicrobials:  Ceftriaxone (10/27>>  Flagyl (10/27>>    Subjective: Patient with improving pain. No chest pain  Objective: Vitals:   06/12/19 0200 06/12/19 0300 06/12/19 0400 06/12/19 0415  BP: 104/68 (!) 107/94 105/69   Pulse: 72 (!) 47 93 99  Resp:      Temp:      TempSrc:      SpO2: 97% 97% 97% 95%  Weight:      Height:        Intake/Output Summary (Last 24 hours) at 06/12/2019 0811 Last data filed at 06/12/2019 0400 Gross per 24 hour  Intake 1871.44 ml  Output --  Net 1871.44 ml   Filed Weights   06/11/19 0953  Weight: 117 kg    Examination:  General exam: Appears calm and comfortable Respiratory system: Clear to auscultation but diminished. Respiratory effort normal. Cardiovascular system: S1 & S2 heard, RRR. No murmurs, rubs, gallops or clicks. Gastrointestinal system: Abdomen is nondistended, soft and tender in RLQ. No organomegaly or masses felt. Normal bowel sounds heard. Central nervous system: Alert and oriented. No focal neurological deficits. Extremities: No edema. No calf tenderness Skin: No cyanosis. No rashes Psychiatry: Judgement and insight appear normal. Mood & affect appropriate.     Data Reviewed: I have personally reviewed following labs and imaging studies  CBC: Recent Labs  Lab 06/06/19 1010 06/11/19 1024 06/12/19 0153  WBC 9.4 9.0 7.4  NEUTROABS 6.6 6.8  --   HGB 11.7* 11.7* 10.8*  HCT 36.8 39.1 37.2  MCV 89.0 92.9 96.4  PLT 453.0* 363 101   Basic Metabolic Panel: Recent Labs  Lab 06/06/19 1010 06/11/19 1024 06/12/19 0153  NA 138 138 136  K 4.1 4.0 3.8  CL 103  106  °CO2 25 22 20*  °GLUCOSE 199* 240* 123*  °BUN 10 17 14  °CREATININE 0.77 1.13* 0.84  °CALCIUM 8.8 9.7 8.4*  ° °GFR: °Estimated Creatinine Clearance: 85.4 mL/min (by C-G formula based on SCr of 0.84 mg/dL). °Liver Function Tests: °Recent Labs  °Lab 06/06/19 °1010 06/11/19 °1024  °AST 11  14*  °ALT 10 8  °ALKPHOS 108 88  °BILITOT 0.7 0.6  °PROT 6.4 6.9  °ALBUMIN 3.5 4.0  ° °Recent Labs  °Lab 06/11/19 °1024  °LIPASE 25  ° °No results for input(s): AMMONIA in the last 168 hours. °Coagulation Profile: °Recent Labs  °Lab 06/11/19 °1024  °INR 1.6*  ° °Cardiac Enzymes: °No results for input(s): CKTOTAL, CKMB, CKMBINDEX, TROPONINI in the last 168 hours. °BNP (last 3 results) °No results for input(s): PROBNP in the last 8760 hours. °HbA1C: °No results for input(s): HGBA1C in the last 72 hours. °CBG: °Recent Labs  °Lab 06/12/19 °0025 06/12/19 °0500 06/12/19 °0754  °GLUCAP 111* 121* 114*  ° °Lipid Profile: °No results for input(s): CHOL, HDL, LDLCALC, TRIG, CHOLHDL, LDLDIRECT in the last 72 hours. °Thyroid Function Tests: °No results for input(s): TSH, T4TOTAL, FREET4, T3FREE, THYROIDAB in the last 72 hours. °Anemia Panel: °No results for input(s): VITAMINB12, FOLATE, FERRITIN, TIBC, IRON, RETICCTPCT in the last 72 hours. °Sepsis Labs: °Recent Labs  °Lab 06/11/19 °1205 06/11/19 °1526  °LATICACIDVEN 2.9* 1.5  ° ° °Recent Results (from the past 240 hour(s))  °Urine Culture     Status: None  ° Collection Time: 06/06/19  3:38 PM  ° Specimen: Urine  °Result Value Ref Range Status  ° MICRO NUMBER: [id number]  Final  ° SPECIMEN QUALITY: Adequate  Final  ° Sample Source URINE  Final  ° STATUS: FINAL  Final  ° Result:   Final  °  Growth of mixed flora was isolated, suggesting probable contamination. No further testing will be performed. If clinically indicated, recollection using a method to minimize contamination, with prompt transfer to Urine Culture Transport Tube, is  °recommended. °  °MRSA PCR Screening     Status: None  ° Collection Time: 06/11/19  9:52 PM  ° Specimen: Nasal Mucosa; Nasopharyngeal  °Result Value Ref Range Status  ° MRSA by PCR NEGATIVE NEGATIVE Final  °  Comment:        °The GeneXpert MRSA Assay (FDA °approved for NASAL specimens °only), is one component of a °comprehensive MRSA  colonization °surveillance program. It is not °intended to diagnose MRSA °infection nor to guide or °monitor treatment for °MRSA infections. °Performed at [facility], [address] [place], [place] [address] °  °  ° ° ° ° ° °Radiology Studies: °Ct Abdomen Pelvis W Contrast ° °Result Date: 06/11/2019 °CLINICAL DATA:  Left lower quadrant pain, bloating EXAM: CT ABDOMEN AND PELVIS WITH CONTRAST TECHNIQUE: Multidetector CT imaging of the abdomen and pelvis was performed using the standard protocol following bolus administration of intravenous contrast. CONTRAST:  100mL OMNIPAQUE IOHEXOL 300 MG/ML  SOLN COMPARISON:  06/06/2019 FINDINGS: Lower chest: Mild peripheral airspace opacities noted in the lower lungs. No visible effusions. Heart is normal size. Hepatobiliary: Diffuse low-density throughout the liver compatible with fatty infiltration. No focal abnormality. Gallbladder unremarkable. Pancreas: No focal abnormality or ductal dilatation. Spleen: No focal abnormality.  Normal size. Adrenals/Urinary Tract: Prior right nephrectomy. Left kidney unremarkable. No hydronephrosis. Adrenal glands and urinary bladder unremarkable. Stomach/Bowel: Sigmoid and descending colonic diverticulosis. Mild inflammatory stranding adjacent to the proximal sigmoid colon which has decreased since prior   study and compatible with improving diverticulitis. Small bowel decompressed as is the stomach. No bowel obstruction. Vascular/Lymphatic: No evidence of aneurysm or adenopathy. Reproductive: Uterus and adnexa unremarkable.  No mass. Other: No free fluid or free air. Musculoskeletal: No acute bony abnormality. IMPRESSION: Improving but not completely resolved diverticulitis changes in the proximal sigmoid colon. Fatty infiltration of the liver. Mild peripheral opacities in the visualized lower lungs, similar to prior study. No effusions. Electronically Signed   By: Kevin  Dover M.D.   On: 06/11/2019 11:06  ° °Dg Chest  Port 1 View ° °Result Date: 06/11/2019 °CLINICAL DATA:  Positive COVID-19 test.  Abdominal pain. EXAM: PORTABLE CHEST 1 VIEW COMPARISON:  06/06/2019. FINDINGS: Stable cardiomegaly with normal pulmonary vascularity. Chronic bilateral pulmonary interstitial prominence noted. Findings consistent chronic interstitial lung disease. No acute abnormality identified. No prominent pleural effusion. No pneumothorax. IMPRESSION: 1.  Stable cardiomegaly. 2. Chronic bilateral pulmonary interstitial prominence consistent chronic interstitial lung disease again noted. No acute abnormality identified. Electronically Signed   By: Thomas  Register   On: 06/11/2019 12:03  ° ° ° ° ° ° °Scheduled Meds: °• allopurinol  100 mg Oral QHS  °• atorvastatin  40 mg Oral QHS  °• Chlorhexidine Gluconate Cloth  6 each Topical Daily  °• DULoxetine  60 mg Oral Daily  °• insulin aspart  0-9 Units Subcutaneous Q4H  °• levothyroxine  125 mcg Oral QAC breakfast  °• pantoprazole  40 mg Oral BID  °• raloxifene  60 mg Oral QHS  °• rivaroxaban  20 mg Oral Q supper  °• sotalol  120 mg Oral Q12H  ° °Continuous Infusions: °• sodium chloride 125 mL/hr at 06/12/19 0400  °• cefTRIAXone (ROCEPHIN)  IV Stopped (06/12/19 0052)  °• diltiazem (CARDIZEM) infusion 5 mg/hr (06/12/19 0400)  °• metronidazole 500 mg (06/12/19 0801)  ° ° ° LOS: 1 day  ° ° ° °Ralph Nettey, MD °[facility] °06/12/2019, 8:11 AM ° °If 7PM-7AM, please contact night-coverage [email or web address] °

## 2019-06-12 NOTE — Progress Notes (Signed)
Surveyor, quantity spoke with Dr. Johnnye Sima with  Infectious disease regarding COVID status. Patient is well beyond two-week window for positive testing and precautions can be discontinued per ID.

## 2019-06-13 ENCOUNTER — Encounter (HOSPITAL_COMMUNITY): Payer: Self-pay | Admitting: Cardiology

## 2019-06-13 DIAGNOSIS — I4891 Unspecified atrial fibrillation: Secondary | ICD-10-CM

## 2019-06-13 DIAGNOSIS — E785 Hyperlipidemia, unspecified: Secondary | ICD-10-CM | POA: Diagnosis not present

## 2019-06-13 DIAGNOSIS — I2510 Atherosclerotic heart disease of native coronary artery without angina pectoris: Secondary | ICD-10-CM | POA: Diagnosis not present

## 2019-06-13 DIAGNOSIS — I10 Essential (primary) hypertension: Secondary | ICD-10-CM | POA: Diagnosis not present

## 2019-06-13 LAB — GLUCOSE, CAPILLARY
Glucose-Capillary: 108 mg/dL — ABNORMAL HIGH (ref 70–99)
Glucose-Capillary: 109 mg/dL — ABNORMAL HIGH (ref 70–99)
Glucose-Capillary: 121 mg/dL — ABNORMAL HIGH (ref 70–99)
Glucose-Capillary: 142 mg/dL — ABNORMAL HIGH (ref 70–99)
Glucose-Capillary: 192 mg/dL — ABNORMAL HIGH (ref 70–99)
Glucose-Capillary: 97 mg/dL (ref 70–99)

## 2019-06-13 LAB — URINE CULTURE: Culture: 10000 — AB

## 2019-06-13 NOTE — Progress Notes (Signed)
PROGRESS NOTE    Melinda Mckinney  KJZ:[id number] DOB: 06/13/1956 DOA: 06/11/2019 PCP: Colon Branch, MD   Brief Narrative: Melinda Mckinney is a 63 y.o. female with a history of atrial fibrillation on Xarelto, CAD s/p stent, hypertension, systolic heart failure. Patient presented secondary to abdominal pain and found to have diverticulitis, failing outpatient treatment. Antibiotics started. Also found to have atrial fibrillation with RVR.   Assessment & Plan:   Principal Problem:   Sepsis ([place]) Active Problems:   Essential hypertension   Coronary artery disease involving native coronary artery of native heart with angina pectoris (HCC)   Atrial fibrillation with rapid ventricular response (HCC)   Chronic systolic CHF (congestive heart failure) ([place])   COVID-19   Diverticulitis   Diverticulitis Failed outpatient treatment with ciprofloxacin/flagyl. Started on Ceftriaxone/Flagyl on admission. Does not appear to have met sepsis criteria on admission. Blood cultures with no growth to date. Afebrile. WBC normal -Transition to Cefdinir/flagyl  Essential hypertension Blood pressure soft. In setting of antiarrhythmic treatment.   Dizziness Possibly related to soft blood pressure -PT/Orthostatic vitals  Pre-diabetes No evidence of diabetes on chart review. Highest hemoglobin A1C is 6.1%. Recommend diet modification.  Atrial fibrillation with RVR Patient managed on diltiazem drip initially. Sotalol continued. Rate improved and she was transitioned to home regimen. Rates have worsened again and blood pressure remains soft. -Continue sotalol and diltiazem -Continue Xarelto -Cardiology consult  Chronic systolic heart failure Stable and euvolemic. -Daily weights/strict in and outs  Hypothyroidism Recent TSH of 1.6 -Continue Synthroid  Recent COVID-19 pneumonia Symptoms improved. Still with some dyspnea on exertion but much improved from weeks prior.   DVT prophylaxis: Xarelto Code  Status:   Code Status: Full Code Family Communication: None at bedside Disposition Plan: Discharge home likely in 24 to 48 hours pending control of heart rate and infection   Consultants:   None  Procedures:   None  Antimicrobials:  Ceftriaxone (10/27>>  Flagyl (10/27>>    Subjective: Abdominal pain improved.  Objective: Vitals:   06/13/19 0700 06/13/19 0800 06/13/19 0815 06/13/19 1148  BP:  (!) 104/58    Pulse: 67 86    Resp: 17 12    Temp:   98 F (36.7 C) 98.2 F (36.8 C)  TempSrc:   Oral Oral  SpO2: 98% 96%    Weight:      Height:        Intake/Output Summary (Last 24 hours) at 06/13/2019 1255 Last data filed at 06/13/2019 0100 Gross per 24 hour  Intake 280.96 ml  Output 650 ml  Net -369.04 ml   Filed Weights   06/11/19 0953  Weight: 117 kg    Examination:  General exam: Appears calm and comfortable Respiratory system: Clear to auscultation. Respiratory effort normal. Cardiovascular system: S1 & S2 heard, RRR. No murmurs, rubs, gallops or clicks. Gastrointestinal system: Abdomen is mildly distended, soft and tender in RLQ with some guarding. Normal bowel sounds heard. Central nervous system: Alert and oriented. No focal neurological deficits. Extremities: No edema. No calf tenderness Skin: No cyanosis. No rashes Psychiatry: Judgement and insight appear normal. Mood & affect appropriate.     Data Reviewed: I have personally reviewed following labs and imaging studies  CBC: Recent Labs  Lab 06/11/19 1024 06/12/19 0153  WBC 9.0 7.4  NEUTROABS 6.8  --   HGB 11.7* 10.8*  HCT 39.1 37.2  MCV 92.9 96.4  PLT 363 948   Basic Metabolic Panel: Recent Labs  Lab 06/11/19 1024 06/12/19 0153  NA 138 136  K 4.0 3.8  CL 104 106  CO2 22 20*  GLUCOSE 240* 123*  BUN 17 14  CREATININE 1.13* 0.84  CALCIUM 9.7 8.4*   GFR: Estimated Creatinine Clearance: 85.4 mL/min (by C-G formula based on SCr of 0.84 mg/dL). Liver Function Tests: Recent Labs   Lab 06/11/19 1024  AST 14*  ALT 8  ALKPHOS 88  BILITOT 0.6  PROT 6.9  ALBUMIN 4.0   Recent Labs  Lab 06/11/19 1024  LIPASE 25   No results for input(s): AMMONIA in the last 168 hours. Coagulation Profile: Recent Labs  Lab 06/11/19 1024  INR 1.6*   Cardiac Enzymes: No results for input(s): CKTOTAL, CKMB, CKMBINDEX, TROPONINI in the last 168 hours. BNP (last 3 results) No results for input(s): PROBNP in the last 8760 hours. HbA1C: No results for input(s): HGBA1C in the last 72 hours. CBG: Recent Labs  Lab 06/12/19 2036 06/12/19 2308 06/13/19 0310 06/13/19 0732 06/13/19 1105  GLUCAP 144* 118* 121* 109* 192*   Lipid Profile: No results for input(s): CHOL, HDL, LDLCALC, TRIG, CHOLHDL, LDLDIRECT in the last 72 hours. Thyroid Function Tests: No results for input(s): TSH, T4TOTAL, FREET4, T3FREE, THYROIDAB in the last 72 hours. Anemia Panel: No results for input(s): VITAMINB12, FOLATE, FERRITIN, TIBC, IRON, RETICCTPCT in the last 72 hours. Sepsis Labs: Recent Labs  Lab 06/11/19 1205 06/11/19 1526  LATICACIDVEN 2.9* 1.5    Recent Results (from the past 240 hour(s))  Urine Culture     Status: None   Collection Time: 06/06/19  3:38 PM   Specimen: Urine  Result Value Ref Range Status   MICRO NUMBER: [id number]  Final   SPECIMEN QUALITY: Adequate  Final   Sample Source URINE  Final   STATUS: FINAL  Final   Result:   Final    Growth of mixed flora was isolated, suggesting probable contamination. No further testing will be performed. If clinically indicated, recollection using a method to minimize contamination, with prompt transfer to Urine Culture Transport Tube, is  recommended.   Culture, blood (Routine X 2) w Reflex to ID Panel     Status: None (Preliminary result)   Collection Time: 06/11/19 12:05 PM   Specimen: BLOOD  Result Value Ref Range Status   Specimen Description   Final    BLOOD RIGHT ANTECUBITAL Performed at [facility], [address] [place], [place] [address]    Special Requests   Final    BOTTLES DRAWN AEROBIC AND ANAEROBIC Blood Culture results may not be optimal due to an inadequate volume of blood received in culture bottles Performed at [facility], [address] [place], [place] [address]    Culture   Final    NO GROWTH 2 DAYS Performed at [facility], [place] [address] [place], [place] [address]    Report Status PENDING  Incomplete  Culture, blood (Routine X 2) w Reflex to ID Panel     Status: None (Preliminary result)   Collection Time: 06/11/19 12:55 PM   Specimen: BLOOD  Result Value Ref Range Status   Specimen Description   Final    BLOOD BLOOD RIGHT FOREARM Performed at [facility], [address] [place], [place] [address]    Special Requests   Final    BOTTLES DRAWN AEROBIC AND ANAEROBIC Blood Culture results may not be optimal due to an inadequate volume of blood received in culture bottles Performed at [place]  [address], [address], [place], [place] [address]    Culture   Final    NO GROWTH 2 DAYS Performed at [facility], [place] [address] [place], [place] [address]    Report Status PENDING  Incomplete  Urine culture     Status: Abnormal   Collection Time: 06/11/19  3:26 PM   Specimen: In/Out Cath Urine  Result Value Ref Range Status   Specimen Description   Final    IN/OUT CATH URINE Performed at [facility], [address] [place], [place] [address]    Special Requests   Final    NONE Performed at [facility], [address] [place], [place] [address]    Culture 10,000 COLONIES/mL YEAST (A)  Final   Report Status 06/13/2019 FINAL  Final  MRSA PCR Screening     Status: None   Collection Time: 06/11/19  9:52 PM   Specimen: Nasal Mucosa; Nasopharyngeal  Result Value Ref Range Status   MRSA by PCR NEGATIVE NEGATIVE Final    Comment:        The GeneXpert MRSA Assay (FDA approved for NASAL specimens only),  is one component of a comprehensive MRSA colonization surveillance program. It is not intended to diagnose MRSA infection nor to guide or monitor treatment for MRSA infections. Performed at [facility], [place] [address], [place], [place] [address]          Radiology Studies: No results found.      Scheduled Meds: . allopurinol  100 mg Oral QHS  . atorvastatin  40 mg Oral QHS  . Chlorhexidine Gluconate Cloth  6 each Topical Daily  . diltiazem  60 mg Oral BID  . DULoxetine  60 mg Oral Daily  . insulin aspart  0-9 Units Subcutaneous Q4H  . levothyroxine  125 mcg Oral QAC breakfast  . pantoprazole  40 mg Oral BID  . raloxifene  60 mg Oral QHS  . rivaroxaban  20 mg Oral Q supper  . sotalol  120 mg Oral Q12H   Continuous Infusions: . cefTRIAXone (ROCEPHIN)  IV Stopped (06/12/19 2142)  . metronidazole Stopped (06/13/19 0850)     LOS: 2 days     Cordelia Poche, MD [facility] 06/13/2019, 12:55 PM  If 7PM-7AM, please contact night-coverage [email or web address]

## 2019-06-13 NOTE — Consult Note (Signed)
Cardiology Consultation:   Patient ID: Melinda Mckinney; CN:[id number]; February 26, 1956   Admit date: 06/11/2019 Date of Consult: 06/13/2019  Primary Care Provider: Colon Branch, MD Primary Cardiologist: Skeet Latch, MD Primary Electrophysiologist: Thompson Grayer, MD  Patient Profile:   Melinda Mckinney is a 63 y.o. female with a PMH of CAD s/p PCI 2010, chronic combined CHF, atrial fibrillation on xarelto, HTN, Pre-DM type 2, hypothyroidism, COVID-19 (dx 04/30/2019), who is admitted for diverticulitis, who is being seen today for the evaluation of atrial fibrillation with RVR at the request of Dr. Lonny Prude.  History of Present Illness:   Melinda Mckinney was admitted to [facility] from 04/30/2019-05/02/2019 for COVID-19 PNA. She was subsequently admitted to [facility] 05/24/2019-05/28/2019 with gastroenteritis, felt to be a sequela of COVID-19. She was managed with supportive care and discharged home. Unfortunately she began experiencing progressive LLQ abdominal pain after discharge, for which she saw her PCP 06/06/2019. She had a CT A/P which showed acute diverticulitis without complications. She was started on cipro and flagyl, however symptoms worsened prompting her to present to the ED 06/12/2019 for further evaluation.   She was last evaluated by cardiology at an outpatient visit with Dr. Rayann Heman 06/2018, at which time she was doing well from a cardiac standpoint and was without complaints. She was continued on sotalol and xarelto for management of her persistent atrial fibrillation and recommended for consideration for ablation should afib reoccur. Appears she had an ED visit 09/2018 in [place] for presyncope and was started on low dose diltiazem at that time for management of HTN. Her last echocardiogram was 07/2017 which showed EF 45-50%, mild AI, moderate MR, and mild RAE. Her last ischemic evaluation was a NST 07/2017 which showed EF 28% (inaccurate based on TTE), and a small defect felt to be breast  attenuation; deemed high risk due to LV function but felt to be non-ischemic.   Initial EKG 06/11/2019 with atrial fibrillation with RVR, rate 147 with PVC. Repeat EKG later that day with improvement in rate to 107 bpm; QTc 475. Vitals over the past 24 hours with intermittent tachycardia up to 100s, generally in the 70s-80s, BP soft, O2 sat stable on [place], afebrile. Labs notable for electrolytes wnl, Cr 1.13>0.84, Hgb 10.8, PLT 300, Lactate 2.9>1.5. Atrial fibrillation with RVR was managed with IV diltiazem gtt and transitioned to home po diltiazem 60mg  BID    Past Medical History:  Diagnosis Date   Abnormal Pap smear of cervix    Anemia    Anxiety and depression    Asthma    Atrial fibrillation ([place]) 07/2017   Borderline diabetes    CAD (coronary artery disease) ~2009   stent    Cervical cancer ([place])    "stage 1; had cryotherapy"   Cervical dysplasia    Chronic lower back pain    DDD (degenerative disc disease), lumbar    Dr. Nelva Bush, recommends lumbar epidural steroid injections L5-S1 to the right   Depression    Fibromyalgia    GERD (gastroesophageal reflux disease)    Headache    "only when I'm short of breath"  (07/27/2017)   High cholesterol    History of hiatal hernia    History of kidney stones    Hypertension    Hypothyroidism    Insomnia [id number]   Lichen sclerosus    Vulvar area   Osteoporosis    Thyroid disease    Vitamin D deficiency     Past Surgical History:  Procedure Laterality Date  CARDIAC CATHETERIZATION  ~ 2008   CARDIOVERSION N/A 08/05/2017   Procedure: CARDIOVERSION;  Surgeon: Evans Lance, MD;  Location: [facility];  Service: Cardiovascular;  Laterality: N/A;   CARDIOVERSION N/A 08/24/2017   Procedure: CARDIOVERSION;  Surgeon: Sanda Klein, MD;  Location: [facility];  Service: Cardiovascular;  Laterality: N/A;   [place]; ~ 1978/1979; 1984   ESOPHAGOMYOTOMY  2009   GYNECOLOGIC CRYOSURGERY  X 2    "stage 1 cancer"   HERNIA REPAIR  X 6   "all in my stomach" (07/27/2017)   KNEE ARTHROSCOPY Right    NEPHRECTOMY Right 1991   in [place], due to fibrosis and lithiasis   TUBAL LIGATION       Home Medications:  Prior to Admission medications   Medication Sig Start Date End Date Taking? Authorizing Provider  allopurinol (ZYLOPRIM) 100 MG tablet Take 0.5 tablets (50 mg total) by mouth daily. Patient taking differently: Take 100 mg by mouth at bedtime.  12/28/18  Yes Paz, Alda Berthold, MD  atorvastatin (LIPITOR) 40 MG tablet Take 1 tablet (40 mg total) by mouth at bedtime. 10/31/18  Yes Paz, Alda Berthold, MD  Cholecalciferol (VITAMIN D3) 5000 UNITS CAPS Take 5,000 Units by mouth daily.    Yes [provider]  cyclobenzaprine (FLEXERIL) 10 MG tablet Take 1 tablet (10 mg total) by mouth 2 (two) times daily as needed for muscle spasms. 10/01/18  Yes Paz, Alda Berthold, MD  diltiazem (CARDIZEM) 120 MG tablet TAKE 1/2 TABLET BY MOUTH TWICE DAILY Patient taking differently: Take 60 mg by mouth 2 (two) times daily.  03/29/19  Yes Paz, Alda Berthold, MD  DULoxetine (CYMBALTA) 60 MG capsule Take 1 capsule (60 mg total) by mouth daily. 01/28/19  Yes Paz, Alda Berthold, MD  furosemide (LASIX) 20 MG tablet Take 1 tablet (20 mg total) by mouth daily. 12/17/18  Yes Paz, Alda Berthold, MD  levothyroxine (SYNTHROID, LEVOTHROID) 125 MCG tablet TAKE ONE TABLET BY MOUTH DAILY BEFORE BREAKFAST Patient taking differently: Take 125 mcg by mouth daily before breakfast.  09/12/18  Yes Philemon Kingdom, MD  lisinopril (ZESTRIL) 10 MG tablet Take 1 tablet (10 mg total) by mouth daily. 01/23/19  Yes Colon Branch, MD  metFORMIN (GLUCOPHAGE) 500 MG tablet Take 1 tablet (500 mg total) by mouth 2 (two) times daily with a meal. 06/10/19  Yes Paz, Alda Berthold, MD  omeprazole (PRILOSEC) 20 MG capsule Take 1 capsule (20 mg total) by mouth 2 (two) times daily before a meal. 06/10/19  Yes Colon Branch, MD  PRESCRIPTION MEDICATION 4 g See admin instructions.  Testosterone 2 % cream  APPLY FOUR CLICKS (ONE GRAM) TO INNER THIGH DAILY. ROTATE   Yes [provider]  raloxifene (EVISTA) 60 MG tablet Take 1 tablet (60 mg total) by mouth daily. Patient taking differently: Take 60 mg by mouth at bedtime.  09/08/16  Yes Terrance Mass, MD  rivaroxaban (XARELTO) 20 MG TABS tablet Take 1 tablet (20 mg total) by mouth daily with supper. 11/05/18  Yes Paz, [place], MD  SOTALOL AF 120 MG TABS TAKE ONE TABLET BY MOUTH EVERY 12 HOURS Patient taking differently: Take 120 mg by mouth every 12 (twelve) hours.  10/02/18  Yes Allred, Jeneen Rinks, MD  zolpidem (AMBIEN) 5 MG tablet Take 1 tablet (5 mg total) by mouth at bedtime as needed. for sleep Patient taking differently: Take 5 mg by mouth at bedtime as needed for sleep. for sleep 01/11/19  Yes Paz, [place]  E, MD  ciprofloxacin (CIPRO) 500 MG tablet Take 500 mg by mouth 2 (two) times daily.    [provider]  metroNIDAZOLE (FLAGYL) 500 MG tablet Take 500 mg by mouth 3 (three) times daily.    [provider]    Inpatient Medications: Scheduled Meds:  allopurinol  100 mg Oral QHS   atorvastatin  40 mg Oral QHS   Chlorhexidine Gluconate Cloth  6 each Topical Daily   diltiazem  60 mg Oral BID   DULoxetine  60 mg Oral Daily   insulin aspart  0-9 Units Subcutaneous Q4H   levothyroxine  125 mcg Oral QAC breakfast   pantoprazole  40 mg Oral BID   raloxifene  60 mg Oral QHS   rivaroxaban  20 mg Oral Q supper   sotalol  120 mg Oral Q12H   Continuous Infusions:  cefTRIAXone (ROCEPHIN)  IV Stopped (06/12/19 2142)   metronidazole Stopped (06/13/19 0850)   PRN Meds: acetaminophen **OR** acetaminophen, cyclobenzaprine, HYDROmorphone (DILAUDID) injection, ondansetron **OR** ondansetron (ZOFRAN) IV, zolpidem  Allergies:    Allergies  Allergen Reactions   Penicillins Swelling and Rash    Has patient had a PCN reaction causing immediate rash, facial/tongue/throat swelling, SOB or  lightheadedness with hypotension: No Has patient had a PCN reaction causing severe rash involving mucus membranes or skin necrosis: No Has patient had a PCN reaction that required hospitalization: No Has patient had a PCN reaction occurring within the last 10 years: No If all of the above answers are "NO", then may proceed with Cephalosporin use.    Social History:   Social History   Socioeconomic History   Marital status: Married    Spouse name: Not on file   Number of children: 3   Years of education: Not on file   Highest education level: Not on file  Occupational History   Occupation: stay home   Social Needs   Financial resource strain: Not on file   Food insecurity    Worry: Not on file    Inability: Not on file   Transportation needs    Medical: Not on file    Non-medical: Not on file  Tobacco Use   Smoking status: Former Smoker    Packs/day: 2.00    Years: 28.00    Pack years: 56.00    Types: Cigarettes    Quit date: 2000    Years since quitting: 20.8   Smokeless tobacco: Never Used  Substance and Sexual Activity   Alcohol use: Yes    Comment: 07/27/2017 "3-4 drinks//month"   Drug use: No   Sexual activity: Not Currently    Comment: 1ST INTERCOURSE- 16, PARTNERS - 2  Lifestyle   Physical activity    Days per week: Not on file    Minutes per session: Not on file   Stress: Not on file  Relationships   Social connections    Talks on phone: Not on file    Gets together: Not on file    Attends religious service: Not on file    Active member of club or organization: Not on file    Attends meetings of clubs or organizations: Not on file    Relationship status: Not on file   Intimate partner violence    Fear of current or ex partner: Not on file    Emotionally abused: Not on file    Physically abused: Not on file    Forced sexual activity: Not on file  Other Topics Concern  Not on file  Social History Narrative   Original from [place]   3 children, 2 alive   Household --pt and husband     Family History:    Family History  Problem Relation Age of Onset   Breast cancer Other        aunt    CAD Brother    Lung cancer Mother        F and M   Diabetes Father        F and mother    CAD Father    Lung cancer Father    Stroke Sister    Colon cancer Neg Hx      ROS:  Please see the history of present illness.   All other ROS reviewed and negative.     Physical Exam/Data:   Vitals:   06/13/19 0700 06/13/19 0800 06/13/19 0815 06/13/19 1148  BP:  (!) 104/58    Pulse: 67 86    Resp: 17 12    Temp:   98 F (36.7 C) 98.2 F (36.8 C)  TempSrc:   Oral Oral  SpO2: 98% 96%    Weight:      Height:        Intake/Output Summary (Last 24 hours) at 06/13/2019 1247 Last data filed at 06/13/2019 0100 Gross per 24 hour  Intake 280.96 ml  Output 650 ml  Net -369.04 ml   Filed Weights   06/11/19 0953  Weight: 117 kg    EKG:  The EKG was personally reviewed and demonstrates:  Initial EKG 06/11/2019 with atrial fibrillation with RVR, rate 147 with PVC. Repeat EKG later that day with improvement in rate to 107 bpm; QTc 475   Relevant CV Studies: Echocardiogram 07/2017: Study Conclusions  - Left ventricle: The cavity size was normal. Wall thickness was   normal. Systolic function was mildly reduced. The estimated   ejection fraction was in the range of 45% to 50%. - Aortic valve: There was mild regurgitation. - Mitral valve: There was moderate regurgitation. - Right atrium: The atrium was mildly dilated.  NST 07/2017:  There was no ST segment deviation noted during stress.  Nuclear stress EF: 28%. The left ventricular ejection fraction is severely decreased (<30%).  Defect 1: There is a small defect of mild severity present in the mid anterior, mid anteroseptal and apical septal location. This is a fixed defect and is likely due to breast or chest wall attenuation.  This is a high risk  study based on reduction of LV function . There is no evidence of ischemia or infarction .  Laboratory Data:  Chemistry Recent Labs  Lab 06/11/19 1024 06/12/19 0153  NA 138 136  K 4.0 3.8  CL 104 106  CO2 22 20*  GLUCOSE 240* 123*  BUN 17 14  CREATININE 1.13* 0.84  CALCIUM 9.7 8.4*  GFRNONAA 51* >60  GFRAA 59* >60  ANIONGAP 12 10    Recent Labs  Lab 06/11/19 1024  PROT 6.9  ALBUMIN 4.0  AST 14*  ALT 8  ALKPHOS 88  BILITOT 0.6   Hematology Recent Labs  Lab 06/11/19 1024 06/12/19 0153  WBC 9.0 7.4  RBC 4.21 3.86*  HGB 11.7* 10.8*  HCT 39.1 37.2  MCV 92.9 96.4  MCH 27.8 28.0  MCHC 29.9* 29.0*  RDW 16.2* 16.5*  PLT 363 300   Cardiac EnzymesNo results for input(s): TROPONINI in the last 168 hours. No results for input(s): TROPIPOC in the last 168  hours.  BNPNo results for input(s): BNP, PROBNP in the last 168 hours.  DDimer No results for input(s): DDIMER in the last 168 hours.  Radiology/Studies:  Ct Abdomen Pelvis W Contrast  Result Date: 06/11/2019 CLINICAL DATA:  Left lower quadrant pain, bloating EXAM: CT ABDOMEN AND PELVIS WITH CONTRAST TECHNIQUE: Multidetector CT imaging of the abdomen and pelvis was performed using the standard protocol following bolus administration of intravenous contrast. CONTRAST:  17mL OMNIPAQUE IOHEXOL 300 MG/ML  SOLN COMPARISON:  06/06/2019 FINDINGS: Lower chest: Mild peripheral airspace opacities noted in the lower lungs. No visible effusions. Heart is normal size. Hepatobiliary: Diffuse low-density throughout the liver compatible with fatty infiltration. No focal abnormality. Gallbladder unremarkable. Pancreas: No focal abnormality or ductal dilatation. Spleen: No focal abnormality.  Normal size. Adrenals/Urinary Tract: Prior right nephrectomy. Left kidney unremarkable. No hydronephrosis. Adrenal glands and urinary bladder unremarkable. Stomach/Bowel: Sigmoid and descending colonic diverticulosis. Mild inflammatory stranding adjacent  to the proximal sigmoid colon which has decreased since prior study and compatible with improving diverticulitis. Small bowel decompressed as is the stomach. No bowel obstruction. Vascular/Lymphatic: No evidence of aneurysm or adenopathy. Reproductive: Uterus and adnexa unremarkable.  No mass. Other: No free fluid or free air. Musculoskeletal: No acute bony abnormality. IMPRESSION: Improving but not completely resolved diverticulitis changes in the proximal sigmoid colon. Fatty infiltration of the liver. Mild peripheral opacities in the visualized lower lungs, similar to prior study. No effusions. Electronically Signed   By: Rolm Baptise M.D.   On: 06/11/2019 11:06   Dg Chest Port 1 View  Result Date: 06/11/2019 CLINICAL DATA:  Positive COVID-19 test.  Abdominal pain. EXAM: PORTABLE CHEST 1 VIEW COMPARISON:  06/06/2019. FINDINGS: Stable cardiomegaly with normal pulmonary vascularity. Chronic bilateral pulmonary interstitial prominence noted. Findings consistent chronic interstitial lung disease. No acute abnormality identified. No prominent pleural effusion. No pneumothorax. IMPRESSION: 1.  Stable cardiomegaly. 2. Chronic bilateral pulmonary interstitial prominence consistent chronic interstitial lung disease again noted. No acute abnormality identified. Electronically Signed   By: Marcello Moores  Register   On: 06/11/2019 12:03    Assessment and Plan:   1. Atrial fibrillation with RVR: patient with intermittent RVR this admission, likely persisting from COVID-19 admission 04/2019. RVR likely driven by diverticulitis infection at this time. Rates generally controlled over the past 24 hours. Suspect things will settle down as she continues to recover from her present illness. BP limits titration of diltiazem. Noted to have ?intolerance to metoprolol in PCP notes. This would be a better option given history of mild LV systolic dysfunction. QTc stable on sotalol. On xarelto given CHA2DS2-VASc Score of 6 (CHF, HTN, DM  type 2 Vascular, Age 70-74, and Female)  -   2. Chronic combined CHF: EF 45-50% on echo in 2018. No complaints of volume overload. Not on BBlocker therapy for unclear reason - ?prior intolerance. Home lisinopril and lasix on hold given soft BP's. Appears euvolemic on exam - Consider repeat echo outpatient   3. HTN: BP soft this admission. Home diltiazem restarted today with generally stable BP's. Home lisinopril and lasix on hold.   4. HLD: LDL 118 10/2018; goal <70 given CAD history - Will increase atorvastatin to 80mg  daily  5. CAD s/p PCI: Hx of abnormal stress test prompting LHC with PCI to unknown vessel in ~2010. No anginal complaints. Not on aspirin given need for anticoagulation - Continue statin   For questions or updates, please contact [place] Please consult [email or web address] for contact info under Cardiology/STEMI.   Signed, Abigail Butts,  PA-C  06/13/2019 12:47 PM [phone]  History and all data above reviewed.  Patient examined.  I agree with the findings as above.   The patient has had a complicated course with COVID 19.  She has atrial fib and does not feel this usually.  She might feel this if she bends over or hurries to do something.  The patient denies any new symptoms such as chest discomfort, neck or arm discomfort. There has been no new shortness of breath, PND or orthopnea. There has been no presyncope or syncope.   GENERAL:  Well appearing HEENT:  Pupils equal round and reactive, fundi not visualized, oral mucosa unremarkable NECK:  No jugular venous distention, waveform within normal limits, carotid upstroke brisk and symmetric, no bruits, no thyromegaly LYMPHATICS:  No cervical, inguinal adenopathy LUNGS:  Clear to auscultation bilaterally BACK:  No CVA tenderness CHEST:  Unremarkable HEART:  PMI not displaced or sustained,S1 and S2 within normal limits, no S3, no clicks, no rubs, no murmurs, irregular, distant heart sounds ABD:  Flat, positive  bowel sounds normal in frequency in pitch, no bruits, no rebound, no guarding, no midline pulsatile mass, no hepatomegaly, no splenomegaly, obese EXT:  2 plus pulses throughout, no edema, no cyanosis no clubbing SKIN:  No rashes no nodules NEURO:  Cranial nerves II through XII grossly intact, motor grossly intact throughout PSYCH:  Cognitively intact, oriented to person place and time   All available labs, radiology testing, previous records reviewed. Agree with documented assessment and plan. Atrial fib:  She tolerates anticoagulation.  She currently has good rate control on meds as listed.  I do not suspect that she will need any change in therapy.  No further testing.  Continue current meds. She does not need to be on a beta blocker as the Sotalol has beta blocking properties and rate is otherwise controlled. CAD:  No evidence of ischemia.  No further work up.   Prolonged QT:  Avoid QT prolonging meds.  Check magnesium.  I will follow up with repeat EKG.     Jeneen Rinks Tongela Encinas  2:30 PM  06/13/2019

## 2019-06-14 DIAGNOSIS — I4891 Unspecified atrial fibrillation: Secondary | ICD-10-CM | POA: Diagnosis not present

## 2019-06-14 LAB — GLUCOSE, CAPILLARY
Glucose-Capillary: 93 mg/dL (ref 70–99)
Glucose-Capillary: 101 mg/dL — ABNORMAL HIGH (ref 70–99)
Glucose-Capillary: 113 mg/dL — ABNORMAL HIGH (ref 70–99)
Glucose-Capillary: 141 mg/dL — ABNORMAL HIGH (ref 70–99)
Glucose-Capillary: 111 mg/dL — ABNORMAL HIGH (ref 70–99)

## 2019-06-14 LAB — BASIC METABOLIC PANEL
Anion gap: 6 (ref 5–15)
BUN: 9 mg/dL (ref 8–23)
CO2: 23 mmol/L (ref 22–32)
Calcium: 8.7 mg/dL — ABNORMAL LOW (ref 8.9–10.3)
Chloride: 110 mmol/L (ref 98–111)
Creatinine, Ser: 0.66 mg/dL (ref 0.44–1.00)
GFR calc Af Amer: >60 mL/min (ref >60)
GFR calc non Af Amer: >60 mL/min (ref >60)
Glucose, Bld: 107 mg/dL — ABNORMAL HIGH (ref 70–99)
Potassium: 3.8 mmol/L (ref 3.5–5.1)
Sodium: 139 mmol/L (ref 135–145)

## 2019-06-14 LAB — CBC
HCT: 34.8 % — ABNORMAL LOW (ref 36.0–46.0)
Hemoglobin: 10.1 g/dL — ABNORMAL LOW (ref 12.0–15.0)
MCH: 27.9 pg (ref 26.0–34.0)
MCHC: 29 g/dL — ABNORMAL LOW (ref 30.0–36.0)
MCV: 96.1 fL (ref 80.0–100.0)
Platelets: 248 10*3/uL (ref 150–400)
RBC: 3.62 MIL/uL — ABNORMAL LOW (ref 3.87–5.11)
RDW: 16.5 % — ABNORMAL HIGH (ref 11.5–15.5)
WBC: 5.5 10*3/uL (ref 4.0–10.5)
nRBC: 0 % (ref 0.0–0.2)

## 2019-06-14 MED ORDER — NYSTATIN 100000 UNIT/GM EX POWD
Freq: Two times a day (BID) | CUTANEOUS | Status: DC
  Administered 2019-06-15 – 2019-06-16 (×4): via TOPICAL
  Administered 2019-06-17: 1 via TOPICAL
  Administered 2019-06-18 – 2019-06-25 (×9): via TOPICAL
  Filled 2019-06-14 (×4): qty 15

## 2019-06-14 MED ORDER — METRONIDAZOLE 500 MG PO TABS
500.0000 mg | ORAL_TABLET | Freq: Three times a day (TID) | ORAL | Status: AC
  Administered 2019-06-14 – 2019-06-20 (×19): 500 mg via ORAL
  Filled 2019-06-14 (×20): qty 1

## 2019-06-14 MED ORDER — DILTIAZEM HCL 60 MG PO TABS
90.0000 mg | ORAL_TABLET | Freq: Two times a day (BID) | ORAL | Status: DC
  Administered 2019-06-14 (×2): 90 mg via ORAL
  Filled 2019-06-14 (×2): qty 1

## 2019-06-14 MED ORDER — CEFDINIR 300 MG PO CAPS
300.0000 mg | ORAL_CAPSULE | Freq: Two times a day (BID) | ORAL | Status: AC
  Administered 2019-06-14 – 2019-06-20 (×13): 300 mg via ORAL
  Filled 2019-06-14 (×15): qty 1

## 2019-06-14 NOTE — Progress Notes (Addendum)
Progress Note  Patient Name: Melinda Mckinney Date of Encounter: 06/14/2019  Primary Cardiologist: Skeet Latch, MD   Subjective   Patient reports feeling good this morning. No issues overnight. Abdominal pain is still present, mild, improved from admission. No complaints of chest pain, SOB, or palpitations. She is hopeful to go home today  Inpatient Medications    Scheduled Meds: . allopurinol  100 mg Oral QHS  . atorvastatin  40 mg Oral QHS  . Chlorhexidine Gluconate Cloth  6 each Topical Daily  . diltiazem  90 mg Oral BID  . DULoxetine  60 mg Oral Daily  . insulin aspart  0-9 Units Subcutaneous Q4H  . levothyroxine  125 mcg Oral QAC breakfast  . pantoprazole  40 mg Oral BID  . raloxifene  60 mg Oral QHS  . rivaroxaban  20 mg Oral Q supper  . sotalol  120 mg Oral Q12H   Continuous Infusions: . cefTRIAXone (ROCEPHIN)  IV Stopped (06/13/19 2138)  . metronidazole Stopped (06/14/19 0143)   PRN Meds: acetaminophen **OR** acetaminophen, cyclobenzaprine, HYDROmorphone (DILAUDID) injection, ondansetron **OR** ondansetron (ZOFRAN) IV, zolpidem   Vital Signs    Vitals:   06/14/19 0352 06/14/19 0407 06/14/19 0600 06/14/19 0800  BP:  123/85 110/75 (!) 115/91  Pulse:  90 (!) 101 (!) 115  Resp:      Temp: 98.1 F (36.7 C)     TempSrc: Oral     SpO2:  97% 97% 99%  Weight:      Height:        Intake/Output Summary (Last 24 hours) at 06/14/2019 0817 Last data filed at 06/14/2019 0147 Gross per 24 hour  Intake 418.4 ml  Output -  Net 418.4 ml   Filed Weights   06/11/19 0953  Weight: 117 kg    Telemetry    Atrial fibrillation, rates up to 120s this morning - Personally Reviewed  ECG    Atrial fibrillation with RVR, rate 104, QTc 444, no STE/D, no TWI - Personally Reviewed  Physical Exam   GEN: Laying in bed in no acute distress.   Neck: No JVD, no carotid bruits Cardiac: IRIR, no murmurs, rubs, or gallops.  Respiratory: Clear to auscultation bilaterally, no  wheezes/ rales/ rhonchi GI: NABS, Soft, obese, nontender, non-distended  MS: No edema; No deformity. Neuro:  Nonfocal, moving all extremities spontaneously Psych: Normal affect   Labs    Chemistry Recent Labs  Lab 06/11/19 1024 06/12/19 0153  NA 138 136  K 4.0 3.8  CL 104 106  CO2 22 20*  GLUCOSE 240* 123*  BUN 17 14  CREATININE 1.13* 0.84  CALCIUM 9.7 8.4*  PROT 6.9  --   ALBUMIN 4.0  --   AST 14*  --   ALT 8  --   ALKPHOS 88  --   BILITOT 0.6  --   GFRNONAA 51* >60  GFRAA 59* >60  ANIONGAP 12 10     Hematology Recent Labs  Lab 06/11/19 1024 06/12/19 0153  WBC 9.0 7.4  RBC 4.21 3.86*  HGB 11.7* 10.8*  HCT 39.1 37.2  MCV 92.9 96.4  MCH 27.8 28.0  MCHC 29.9* 29.0*  RDW 16.2* 16.5*  PLT 363 300    Cardiac EnzymesNo results for input(s): TROPONINI in the last 168 hours. No results for input(s): TROPIPOC in the last 168 hours.   BNPNo results for input(s): BNP, PROBNP in the last 168 hours.   DDimer No results for input(s): DDIMER in the last 168 hours.  Radiology    No results found.  Cardiac Studies   Echocardiogram 07/2017: Study Conclusions  - Left ventricle: The cavity size was normal. Wall thickness was normal. Systolic function was mildly reduced. The estimated ejection fraction was in the range of 45% to 50%. - Aortic valve: There was mild regurgitation. - Mitral valve: There was moderate regurgitation. - Right atrium: The atrium was mildly dilated.  NST 07/2017:  There was no ST segment deviation noted during stress.  Nuclear stress EF: 28%. The left ventricular ejection fraction is severely decreased (<30%).  Defect 1: There is a small defect of mild severity present in the mid anterior, mid anteroseptal and apical septal location. This is a fixed defect and is likely due to breast or chest wall attenuation.  This is a high risk study based on reduction of LV function . There is no evidence of ischemia or infarction .   Patient Profile     63 y.o. female with a PMH of CAD s/p PCI 2010, chronic combined CHF, atrial fibrillation on xarelto, HTN, Pre-DM type 2, hypothyroidism, COVID-19 (dx 04/30/2019), who is admitted for diverticulitis, who is being followed by cardiology for the evaluation of atrial fibrillation with RVR   Assessment & Plan    1. Atrial fibrillation with RVR: patient with intermittent RVR this admission, likely persisting from COVID-19 admission 04/2019. RVR likely driven by diverticulitis infection at this time. Rates in the 100s-110s this morning. Suspect things will settle down as she continues to recover from her present illness. QTc stable on sotalol. On xarelto given CHA2DS2-VASc Score of 6 (CHF, HTN, DM type 2 Vascular, Age 49-74, and Female)  - Will increase diltiazem to 90mg  BID as BP is stable this morning and rates are mildly elevated - Continue sotalol - EKG pending to monitor QTc - Continue xartelto for stroke ppx  2. Chronic combined CHF: EF 45-50% on echo in 2018. No complaints of volume overload. Not on BBlocker therapy for unclear reason - ?prior intolerance. Home lisinopril and lasix on hold given soft BP's. Appears euvolemic on exam - Continue to monitor volume status closely  3. HTN: BP soft this admission. Home diltiazem restarted today with generally stable BP's. Home lisinopril and lasix on hold.  - Resume home medications as BP allows  4. HLD: LDL 118 10/2018; goal <70 given CAD history - Continue atorvastatin 80mg  daily  5. CAD s/p PCI: Hx of abnormal stress test prompting LHC with PCI to unknown vessel in ~2010. No anginal complaints. Not on aspirin given need for anticoagulation - Continue statin  Will arrange follow-up with Dr. Christiana [facility] to be seen post-discharge.    For questions or updates, please contact [place] Please consult [email or web address] for contact info under Cardiology/STEMI.      Signed, Abigail Butts, PA-C  06/14/2019, 8:17  AM      History and all data above reviewed.  Patient examined.  I agree with the findings as above.  She feels OK.  She has her make up on.  No chest pain.  No palpitations. The patient exam reveals COR:RRR  ,  Lungs: Clear  ,  Abd: Positive bowel sounds, no rebound no guarding, Ext no edema,  Skin:  Groin rask  .  All available labs, radiology testing, previous records reviewed. Agree with documented assessment and plan.  Atrial fib:  Rate is increased.  Agree with increased Cardizem as above and possibly consolidate to CR prior to discharge.  Likely will need lower or  no lisinopril which can be titrated.  She has no overt failure and only mildly reduced EF.    Jeneen Rinks Demetrious Rainford  12:40 PM  06/14/2019

## 2019-06-14 NOTE — Evaluation (Signed)
Physical Therapy Evaluation Patient Details Name: Melinda Mckinney MRN: [id number] DOB: [birth year] Today's Date: 06/14/2019   History of Present Illness  63 y.o. female with a history of atrial fibrillation on Xarelto, CAD, Covid+ 9/20, s/p stent, hypertension, systolic heart failure. Patient presented secondary to abdominal pain and found to have diverticulitis, failing outpatient treatment. Also  with atrial fibrillation with RVR,   Clinical Impression  Pt admitted with above diagnosis.  Pt currently with functional limitations due to the deficits listed below (see PT Problem List).  Pt with limited activitty tolerance, HR 70-85, SpO2=92-96%, RR 17-26. Pt with mild dizziness after amb -- BP 100/68, resolved with sitting rest. Pt has hx of orthostasis per rehab notes last admission Pt will benefit from skilled PT to increase their independence and safety with mobility to allow discharge to the venue listed below.     Follow Up Recommendations Home health PT;Supervision for mobility/OOB    Equipment Recommendations  None recommended by PT    Recommendations for Other Services       Precautions / Restrictions Precautions Precautions: Fall Precaution Comments: recent falls Restrictions Weight Bearing Restrictions: No      Mobility  Bed Mobility Overal bed mobility: Needs Assistance Bed Mobility: Supine to Sit     Supine to sit: Supervision     General bed mobility comments: for lines and safety  Transfers Overall transfer level: Needs assistance Equipment used: Rolling walker (2 wheeled) Transfers: Sit to/from Stand Sit to Stand: Min guard         General transfer comment: cues for hand placement, mild dizziness after amb with BP 100/68  Ambulation/Gait Ambulation/Gait assistance: Min guard Gait Distance (Feet): 45 Feet Assistive device: Rolling walker (2 wheeled) Gait Pattern/deviations: Step-through pattern;Decreased stride length Gait velocity: decr   General  Gait Details: min/guard for safety, mild dizziness last 5'  Stairs            Wheelchair Mobility    Modified Rankin (Stroke Patients Only)       Balance Overall balance assessment: Needs assistance;History of Falls Sitting-balance support: Feet supported Sitting balance-Leahy Scale: Good     Standing balance support: No upper extremity supported Standing balance-Leahy Scale: Fair Standing balance comment: reliant on UEs for dynamic task                             Pertinent Vitals/Pain Pain Assessment: Faces Faces Pain Scale: Hurts a little bit Pain Location: Left knee Pain Descriptors / Indicators: Aching Pain Intervention(s): Monitored during session    Home Living Family/patient expects to be discharged to:: Private residence Living Arrangements: Spouse/significant other Available Help at Discharge: Family;Available 24 hours/day Type of Home: House Home Access: Other (comment)(has chair lift)     Home Layout: Multi-level;Other (Comment) Home Equipment: Grab bars - tub/shower;Cane - single point;Walker - 2 wheels      Prior Function Level of Independence: Independent with assistive device(s)         Comments: amb with RW or cane     Hand Dominance        Extremity/Trunk Assessment        Lower Extremity Assessment RLE Deficits / Details: noted ROM grossly WFL. strength at least 3 to 3+/5 bil. rapid muscle fatigue with activity       Communication   Communication: Prefers language other than English(ipad in room, however pt speaks some [place])  Cognition Arousal/Alertness: Awake/alert Behavior During Therapy: WFL for tasks assessed/performed  Overall Cognitive Status: Within Functional Limits for tasks assessed                                        General Comments      Exercises     Assessment/Plan    PT Assessment Patient needs continued PT services  PT Problem List Decreased strength;Decreased  range of motion;Decreased activity tolerance;Decreased mobility;Decreased knowledge of use of DME;Cardiopulmonary status limiting activity;Obesity;Pain       PT Treatment Interventions DME instruction;Gait training;Functional mobility training;Therapeutic activities;Therapeutic exercise;Patient/family education    PT Goals (Current goals can be found in the Care Plan section)  Acute Rehab PT Goals Patient Stated Goal: to feel stronger PT Goal Formulation: With patient Time For Goal Achievement: 06/27/19    Frequency Min 3X/week   Barriers to discharge        Co-evaluation               AM-PAC PT "6 Clicks" Mobility  Outcome Measure Help needed turning from your back to your side while in a flat bed without using bedrails?: None Help needed moving from lying on your back to sitting on the side of a flat bed without using bedrails?: None Help needed moving to and from a bed to a chair (including a wheelchair)?: A Little Help needed standing up from a chair using your arms (e.g., wheelchair or bedside chair)?: A Little Help needed to walk in hospital room?: A Little Help needed climbing 3-5 steps with a railing? : A Little 6 Click Score: 20    End of Session Equipment Utilized During Treatment: Gait belt Activity Tolerance: Patient tolerated treatment well;Patient limited by fatigue Patient left: in bed;with call bell/phone within reach;with bed alarm set   PT Visit Diagnosis: Difficulty in walking, not elsewhere classified (R26.2);Muscle weakness (generalized) (M62.81);History of falling (Z91.81)    Time: [id number] PT Time Calculation (min) (ACUTE ONLY): 20 min   Charges:              Kenyon Ana, PT  Pager: [phone] Acute Rehab Dept [employer]): [id number]   06/14/2019   [facility] 06/14/2019, 12:30 PM

## 2019-06-14 NOTE — Progress Notes (Signed)
PROGRESS NOTE    Melinda Mckinney  IBB:[id number] DOB: July 06, 1956 DOA: 06/11/2019 PCP: Colon Branch, MD   Brief Narrative: Melinda Mckinney is a 63 y.o. female with a history of atrial fibrillation on Xarelto, CAD s/p stent, hypertension, systolic heart failure. Patient presented secondary to abdominal pain and found to have diverticulitis, failing outpatient treatment. Antibiotics started. Also found to have atrial fibrillation with RVR.   Assessment & Plan:   Principal Problem:   Sepsis ([place]) Active Problems:   Essential hypertension   Coronary artery disease involving native coronary artery of native heart with angina pectoris (HCC)   Atrial fibrillation with rapid ventricular response (HCC)   Chronic systolic CHF (congestive heart failure) ([place])   COVID-19   Diverticulitis   Diverticulitis Failed outpatient treatment with ciprofloxacin/flagyl. Started on Ceftriaxone/Flagyl on admission. Does not appear to have met sepsis criteria on admission. Blood cultures with no growth to date. Afebrile. WBC normal -Transition to Cefdinir/flagyl today  Essential hypertension Soft blood pressures. Also with history of orthostatic symptoms. -Antihypertensives as below  Dizziness Related to orthostatic vitals. PT recommending home health.  Pre-diabetes No evidence of diabetes on chart review. Highest hemoglobin A1C is 6.1%. Recommend diet modification.  Atrial fibrillation with RVR Patient managed on diltiazem drip initially. Sotalol continued. Rate improved and she was transitioned to home regimen. Rates have worsened again and blood pressure remains soft. -Continue sotalol and diltiazem -Continue Xarelto -Cardiology recommendations: increasing diltiazem  Chronic systolic heart failure Stable and euvolemic. -Daily weights/strict in and outs  Hypothyroidism Recent TSH of 1.6 -Continue Synthroid  Recent COVID-19 pneumonia Symptoms improved. Still with some dyspnea on exertion but much  improved from weeks prior.   DVT prophylaxis: Xarelto Code Status:   Code Status: Full Code Family Communication: None at bedside Disposition Plan: Discharge home likely in 24 hours pending improvement of heart rate   Consultants:   None  Procedures:   None  Antimicrobials:  Ceftriaxone (10/27>>  Flagyl (10/27>>    Subjective: Abdominal pain is improved today.  Objective: Vitals:   06/14/19 0800 06/14/19 1000 06/14/19 1200 06/14/19 1400  BP: (!) 115/91 (!) 134/91 120/60 (!) 95/55  Pulse: (!) 115 (!) 107 (!) 57 (!) 47  Resp:  (!) _0 Temp: 98 F (36.7 C)  98.1 F (36.7 C)   TempSrc: Oral  Oral   SpO2: 99% 99% 94% 95%  Weight:      Height:        Intake/Output Summary (Last 24 hours) at 06/14/2019 1551 Last data filed at 06/14/2019 0147 Gross per 24 hour  Intake 418.4 ml  Output -  Net 418.4 ml   Filed Weights   06/11/19 0953  Weight: 117 kg    Examination:  General exam: Appears calm and comfortable Respiratory system: Clear to auscultation. Respiratory effort normal. Cardiovascular system: S1 & S2 heard, irregular rhythm with fast rate. Gastrointestinal system: Abdomen is nondistended, soft and nontender. No organomegaly or masses felt. Normal bowel sounds heard. Central nervous system: Alert and oriented. No focal neurological deficits. Extremities: No edema. No calf tenderness Skin: No cyanosis. No rashes Psychiatry: Judgement and insight appear normal. Mood & affect appropriate.    Data Reviewed: I have personally reviewed following labs and imaging studies  CBC: Recent Labs  Lab 06/11/19 1024 06/12/19 0153 06/14/19 0813  WBC 9.0 7.4 5.5  NEUTROABS 6.8  --   --   HGB 11.7* 10.8* 10.1*  HCT 39.1 37.2 34.8*  MCV 92.9 96.4 96.1  PLT  363 300 197   Basic Metabolic Panel: Recent Labs  Lab 06/11/19 1024 06/12/19 0153 06/14/19 0813  NA 138 136 139  K 4.0 3.8 3.8  CL 104 106 110  CO2 22 20* 23  GLUCOSE 240* 123* 107*  BUN _0 CREATININE 1.13* 0.84 0.66  CALCIUM 9.7 8.4* 8.7*   GFR: Estimated Creatinine Clearance: 89.6 mL/min (by C-G formula based on SCr of 0.66 mg/dL). Liver Function Tests: Recent Labs  Lab 06/11/19 1024  AST 14*  ALT 8  ALKPHOS 88  BILITOT 0.6  PROT 6.9  ALBUMIN 4.0   Recent Labs  Lab 06/11/19 1024  LIPASE 25   No results for input(s): AMMONIA in the last 168 hours. Coagulation Profile: Recent Labs  Lab 06/11/19 1024  INR 1.6*   Cardiac Enzymes: No results for input(s): CKTOTAL, CKMB, CKMBINDEX, TROPONINI in the last 168 hours. BNP (last 3 results) No results for input(s): PROBNP in the last 8760 hours. HbA1C: No results for input(s): HGBA1C in the last 72 hours. CBG: Recent Labs  Lab 06/13/19 2347 06/14/19 0350 06/14/19 0750 06/14/19 1130 06/14/19 1530  GLUCAP 97 93 101* 113* 141*   Lipid Profile: No results for input(s): CHOL, HDL, LDLCALC, TRIG, CHOLHDL, LDLDIRECT in the last 72 hours. Thyroid Function Tests: No results for input(s): TSH, T4TOTAL, FREET4, T3FREE, THYROIDAB in the last 72 hours. Anemia Panel: No results for input(s): VITAMINB12, FOLATE, FERRITIN, TIBC, IRON, RETICCTPCT in the last 72 hours. Sepsis Labs: Recent Labs  Lab 06/11/19 1205 06/11/19 1526  LATICACIDVEN 2.9* 1.5    Recent Results (from the past 240 hour(s))  Urine Culture     Status: None   Collection Time: 06/06/19  3:38 PM   Specimen: Urine  Result Value Ref Range Status   MICRO NUMBER: [id number]  Final   SPECIMEN QUALITY: Adequate  Final   Sample Source URINE  Final   STATUS: FINAL  Final   Result:   Final    Growth of mixed flora was isolated, suggesting probable contamination. No further testing will be performed. If clinically indicated, recollection using a method to minimize contamination, with prompt transfer to Urine Culture Transport Tube, is  recommended.   Culture, blood (Routine X 2) w Reflex to ID Panel     Status: None (Preliminary result)    Collection Time: 06/11/19 12:05 PM   Specimen: BLOOD  Result Value Ref Range Status   Specimen Description   Final    BLOOD RIGHT ANTECUBITAL Performed at [facility], [address] [place], [place] [address]    Special Requests   Final    BOTTLES DRAWN AEROBIC AND ANAEROBIC Blood Culture results may not be optimal due to an inadequate volume of blood received in culture bottles Performed at [facility], [address] [place], [place] [address]    Culture   Final    NO GROWTH 3 DAYS Performed at [facility], [place] [address], [place], [place] [address]    Report Status PENDING  Incomplete  Culture, blood (Routine X 2) w Reflex to ID Panel     Status: None (Preliminary result)   Collection Time: 06/11/19 12:55 PM   Specimen: BLOOD  Result Value Ref Range Status   Specimen Description   Final    BLOOD BLOOD RIGHT FOREARM Performed at [facility], [address] [place], [place] [address]    Special Requests   Final  BOTTLES DRAWN AEROBIC AND ANAEROBIC Blood Culture results may not be optimal due to an inadequate volume of blood received in culture bottles Performed at [facility], [address] [place], [place] [address]    Culture   Final    NO GROWTH 3 DAYS Performed at [facility], [place] [address], [place], [place] [address]    Report Status PENDING  Incomplete  Urine culture     Status: Abnormal   Collection Time: 06/11/19  3:26 PM   Specimen: In/Out Cath Urine  Result Value Ref Range Status   Specimen Description   Final    IN/OUT CATH URINE Performed at [facility], [address] [place], [place] [address]    Special Requests   Final    NONE Performed at [facility], [address] [place], [place] [address]    Culture 10,000 COLONIES/mL YEAST (A)  Final   Report Status 06/13/2019 FINAL  Final  MRSA PCR Screening     Status: None   Collection Time: 06/11/19  9:52 PM    Specimen: Nasal Mucosa; Nasopharyngeal  Result Value Ref Range Status   MRSA by PCR NEGATIVE NEGATIVE Final    Comment:        The GeneXpert MRSA Assay (FDA approved for NASAL specimens only), is one component of a comprehensive MRSA colonization surveillance program. It is not intended to diagnose MRSA infection nor to guide or monitor treatment for MRSA infections. Performed at [facility], [place] [address], [place], [place] [address]          Radiology Studies: No results found.      Scheduled Meds: . allopurinol  100 mg Oral QHS  . atorvastatin  40 mg Oral QHS  . cefdinir  300 mg Oral Q12H  . Chlorhexidine Gluconate Cloth  6 each Topical Daily  . diltiazem  90 mg Oral BID  . DULoxetine  60 mg Oral Daily  . insulin aspart  0-9 Units Subcutaneous Q4H  . levothyroxine  125 mcg Oral QAC breakfast  . metroNIDAZOLE  500 mg Oral Q8H  . nystatin   Topical BID  . pantoprazole  40 mg Oral BID  . raloxifene  60 mg Oral QHS  . rivaroxaban  20 mg Oral Q supper  . sotalol  120 mg Oral Q12H   Continuous Infusions:    LOS: 3 days     Cordelia Poche, MD [facility] 06/14/2019, 3:51 PM  If 7PM-7AM, please contact night-coverage [email or web address]

## 2019-06-15 DIAGNOSIS — I25119 Atherosclerotic heart disease of native coronary artery with unspecified angina pectoris: Secondary | ICD-10-CM

## 2019-06-15 DIAGNOSIS — I5022 Chronic systolic (congestive) heart failure: Secondary | ICD-10-CM

## 2019-06-15 DIAGNOSIS — I4891 Unspecified atrial fibrillation: Secondary | ICD-10-CM | POA: Diagnosis not present

## 2019-06-15 LAB — GLUCOSE, CAPILLARY
Glucose-Capillary: 106 mg/dL — ABNORMAL HIGH (ref 70–99)
Glucose-Capillary: 114 mg/dL — ABNORMAL HIGH (ref 70–99)
Glucose-Capillary: 118 mg/dL — ABNORMAL HIGH (ref 70–99)
Glucose-Capillary: 119 mg/dL — ABNORMAL HIGH (ref 70–99)
Glucose-Capillary: 166 mg/dL — ABNORMAL HIGH (ref 70–99)
Glucose-Capillary: 168 mg/dL — ABNORMAL HIGH (ref 70–99)
Glucose-Capillary: 95 mg/dL (ref 70–99)

## 2019-06-15 MED ORDER — DILTIAZEM HCL 60 MG PO TABS
60.0000 mg | ORAL_TABLET | Freq: Four times a day (QID) | ORAL | Status: DC
  Administered 2019-06-15 – 2019-06-16 (×5): 60 mg via ORAL
  Filled 2019-06-15 (×5): qty 1

## 2019-06-15 MED ORDER — DILTIAZEM HCL ER COATED BEADS 180 MG PO CP24: 180.0000 mg | ORAL_CAPSULE | Freq: Every day | ORAL | Status: DC

## 2019-06-15 NOTE — Progress Notes (Addendum)
Progress Note  Patient Name: Melinda Mckinney Date of Encounter: 06/15/2019  Primary Cardiologist: Skeet Latch, MD   Subjective   Switched to PO abx for diverticulitis yesterday.  Diltiazem increased to 90 mg BID yesterday.  Soft BP yesterday (95/55), improved to 110/80s this morning.  HR 80s overnight, but up to 120s this morning.  She denies any palpitations or dyspnea.    Inpatient Medications    Scheduled Meds: . allopurinol  100 mg Oral QHS  . atorvastatin  40 mg Oral QHS  . cefdinir  300 mg Oral Q12H  . Chlorhexidine Gluconate Cloth  6 each Topical Daily  . diltiazem  90 mg Oral BID  . DULoxetine  60 mg Oral Daily  . insulin aspart  0-9 Units Subcutaneous Q4H  . levothyroxine  125 mcg Oral QAC breakfast  . metroNIDAZOLE  500 mg Oral Q8H  . nystatin   Topical BID  . pantoprazole  40 mg Oral BID  . raloxifene  60 mg Oral QHS  . rivaroxaban  20 mg Oral Q supper  . sotalol  120 mg Oral Q12H   Continuous Infusions:  PRN Meds: acetaminophen **OR** acetaminophen, cyclobenzaprine, HYDROmorphone (DILAUDID) injection, ondansetron **OR** ondansetron (ZOFRAN) IV, zolpidem   Vital Signs    Vitals:   06/15/19 0300 06/15/19 0400 06/15/19 0600 06/15/19 0700  BP:  112/85 109/75   Pulse: 86 91 (!) 111 (!) 118  Resp: 20 18 20  (!) 21  Temp:  98.3 F (36.8 C)    TempSrc:  Oral    SpO2: 94% 92% 94% 91%  Weight:      Height:        Intake/Output Summary (Last 24 hours) at 06/15/2019 0743 Last data filed at 06/14/2019 1600 Gross per 24 hour  Intake 580 ml  Output 201 ml  Net 379 ml   Filed Weights   06/11/19 0953  Weight: 117 kg    Telemetry    Atrial fibrillation, rates up to 120s this morning, 80s overnight - Personally Reviewed  ECG    10/30: Atrial fibrillation, rate 104, QTc 444 - Personally Reviewed  Physical Exam   GEN: Laying in bed in no acute distress.   Neck: No JVD, no carotid bruits Cardiac: irregular, tachycardic, no murmurs, rubs, or gallops.   Respiratory: Clear to auscultation bilaterally GI: NABS, Soft, obese, nontender, non-distended  MS: No edema; No deformity. Neuro:  Nonfocal, moving all extremities spontaneously Psych: Normal affect   Labs    Chemistry Recent Labs  Lab 06/11/19 1024 06/12/19 0153 06/14/19 0813  NA 138 136 139  K 4.0 3.8 3.8  CL 104 106 110  CO2 22 20* 23  GLUCOSE 240* 123* 107*  BUN 17 14 9   CREATININE 1.13* 0.84 0.66  CALCIUM 9.7 8.4* 8.7*  PROT 6.9  --   --   ALBUMIN 4.0  --   --   AST 14*  --   --   ALT 8  --   --   ALKPHOS 88  --   --   BILITOT 0.6  --   --   GFRNONAA 51* >60 >60  GFRAA 59* >60 >60  ANIONGAP 12 10 6      Hematology Recent Labs  Lab 06/11/19 1024 06/12/19 0153 06/14/19 0813  WBC 9.0 7.4 5.5  RBC 4.21 3.86* 3.62*  HGB 11.7* 10.8* 10.1*  HCT 39.1 37.2 34.8*  MCV 92.9 96.4 96.1  MCH 27.8 28.0 27.9  MCHC 29.9* 29.0* 29.0*  RDW 16.2* 16.5*  16.5*  PLT 363 300 248    Cardiac EnzymesNo results for input(s): TROPONINI in the last 168 hours. No results for input(s): TROPIPOC in the last 168 hours.   BNPNo results for input(s): BNP, PROBNP in the last 168 hours.   DDimer No results for input(s): DDIMER in the last 168 hours.   Radiology    No results found.  Cardiac Studies   Echocardiogram 07/2017: Study Conclusions  - Left ventricle: The cavity size was normal. Wall thickness was normal. Systolic function was mildly reduced. The estimated ejection fraction was in the range of 45% to 50%. - Aortic valve: There was mild regurgitation. - Mitral valve: There was moderate regurgitation. - Right atrium: The atrium was mildly dilated.  NST 07/2017:  There was no ST segment deviation noted during stress.  Nuclear stress EF: 28%. The left ventricular ejection fraction is severely decreased (<30%).  Defect 1: There is a small defect of mild severity present in the mid anterior, mid anteroseptal and apical septal location. This is a fixed defect  and is likely due to breast or chest wall attenuation.  This is a high risk study based on reduction of LV function . There is no evidence of ischemia or infarction .  Patient Profile     63 y.o. female with a PMH of CAD s/p PCI 2010, chronic combined CHF, atrial fibrillation on xarelto, HTN, Pre-DM type 2, hypothyroidism, COVID-19 (dx 04/30/2019), who is admitted for diverticulitis, who is being followed by cardiology for the evaluation of atrial fibrillation with RVR   Assessment & Plan    1. Atrial fibrillation with RVR: patient with intermittent RVR this admission, likely persisting from COVID-19 admission 04/2019. RVR likely driven by diverticulitis infection at this time. Rates were well-controlled overnight (80s) but up to 120s this morning.  Suspect things will settle down as she continues to recover from her present illness. QTc stable on sotalol. On xarelto given CHA2DS2-VASc Score of 6 (CHF, HTN, DM type 2 Vascular, Age 73-74, and Female)  - Diltiazem increased to 90mg  BID yesterday.  Rates were well-controlled following her evening dose of diltiazem but elevated this morning.  This may be due to the BID dosing of short-acting diltiazem, which is usually dosed TID or QID.  Will switch to 60 mg QID.  She did have mild systolic dysfunction on last TTE in 2018, so diltiazem may not be ideal if there is any worsening in systolic function.  Will recheck TTE.  If stable systolic function and tolerating QID dosing of diltiazem, will plan to transition to long-acting diltiazem on discharge - Continue sotalol - EKG yesterday shows QTc 444 - Continue xarelto for stroke ppx - Can consider cardioversion back to sinus as she recovers from her acute illness; she denies any missed doses of xarelto.  This does not need to be done while inpatient if otherwise ready for discharge, but can plan close follow-up in the AF clinic.  2. Chronic combined CHF: EF 45-50% on echo in 2018. No complaints of volume  overload. Not on BBlocker therapy given prior intolerance. Home lisinopril and lasix on hold given soft BP's. Appears euvolemic on exam - Continue to monitor volume status closely  3. HTN: BP soft this admission. Home diltiazem restarted with generally stable BP's. Home lisinopril and lasix on hold.  - Resume home medications as BP allows  4. HLD: LDL 118 10/2018; goal <70 given CAD history - Continue atorvastatin 80mg  daily  5. CAD s/p PCI: Hx of  abnormal stress test prompting LHC with PCI to unknown vessel in ~2010. No anginal complaints. Not on aspirin given need for anticoagulation - Continue statin  Will arrange follow-up with Dr. Christiana Pellant clinic to be seen post-discharge.    For questions or updates, please contact [place] Please consult [email or web address] for contact info under Cardiology/STEMI.      Signed, Donato Heinz, MD  06/15/2019, 7:43 AM

## 2019-06-15 NOTE — Progress Notes (Signed)
PROGRESS NOTE    Melinda Mckinney  OPF:[id number] DOB: [birth year] DOA: 06/11/2019 PCP: Colon [place], MD   Brief Narrative: Melinda Mckinney is a 63 y.o. female with a history of atrial fibrillation on Xarelto, CAD s/p stent, hypertension, systolic heart failure. Patient presented secondary to abdominal pain and found to have diverticulitis, failing outpatient treatment. Antibiotics started. Also found to have atrial fibrillation with RVR.   Assessment & Plan:   Principal Problem:   Sepsis ([place]) Active Problems:   Essential hypertension   Coronary artery disease involving native coronary artery of native heart with angina pectoris (HCC)   Atrial fibrillation with rapid ventricular response (HCC)   Chronic systolic CHF (congestive heart failure) ([place])   COVID-19   Diverticulitis   Diverticulitis Failed outpatient treatment with ciprofloxacin/flagyl. Started on Ceftriaxone/Flagyl on admission. Does not appear to have met sepsis criteria on admission. Blood cultures with no growth to date. Afebrile. WBC normal -Cefdinir/flagyl   Essential hypertension Soft blood pressures. Also with history of orthostatic symptoms. -Antihypertensives as below  Dizziness Related to orthostatic vitals. PT recommending home health. -Orthostatic vitals  Pre-diabetes No evidence of diabetes on chart review. Highest hemoglobin A1C is 6.1%. Recommend diet modification.  Atrial fibrillation with RVR Patient managed on diltiazem drip initially. Sotalol continued. Rate improved and she was transitioned to home regimen. Rates have worsened again and blood pressure remains soft. -Continue sotalol and diltiazem -Continue Xarelto -Cardiology recommendations: increasing frequency of diltiazem and obtaining Transthoracic Echocardiogram   Chronic systolic heart failure Stable and euvolemic. -Daily weights/strict in and outs  Hypothyroidism Recent TSH of 1.6 -Continue Synthroid  Recent COVID-19 pneumonia  Symptoms improved. Still with some dyspnea on exertion but much improved from weeks prior.   DVT prophylaxis: Xarelto Code Status:   Code Status: Full Code Family Communication: None at bedside Disposition Plan: Discharge home likely in 24 hours pending improvement of heart rate   Consultants:   None  Procedures:   None  Antimicrobials:  Ceftriaxone (10/27>>  Flagyl (10/27>>    Subjective: Interpreter: Roman [phone]  Abdominal pain is improved today. Dyspnea with exertion with only a few feet.  Objective: Vitals:   06/15/19 0400 06/15/19 0600 06/15/19 0700 06/15/19 0800  BP: 112/85 109/75  109/75  Pulse: 91 (!) 111 (!) 118 (!) 126  Resp: 18 20 (!) 21 (!) 24  Temp: 98.3 F (36.8 C)  98.2 F (36.8 C)   TempSrc: Oral  Oral   SpO2: 92% 94% 91% 95%  Weight:      Height:        Intake/Output Summary (Last 24 hours) at 06/15/2019 0849 Last data filed at 06/14/2019 1600 Gross per 24 hour  Intake 580 ml  Output 201 ml  Net 379 ml   Filed Weights   06/11/19 0953  Weight: 117 kg    Examination:  General exam: Appears calm and comfortable Respiratory system: Clear to auscultation. Respiratory effort normal. Cardiovascular system: S1 & S2 heard, irregular rhythm with fast rate. No murmurs, rubs, gallops or clicks. Gastrointestinal system: Abdomen is nondistended, soft and mildly tender in RLQ. No organomegaly or masses felt. Normal bowel sounds heard. Central nervous system: Alert and oriented. No focal neurological deficits. Extremities: No edema. No calf tenderness Skin: No cyanosis. No rashes Psychiatry: Judgement and insight appear normal. Mood & affect appropriate.     Data Reviewed: I have personally reviewed following labs and imaging studies  CBC: Recent Labs  Lab 06/11/19 1024 06/12/19 0153 06/14/19 0813  WBC 9.0 7.4  5.5  NEUTROABS 6.8  --   --   HGB 11.7* 10.8* 10.1*  HCT 39.1 37.2 34.8*  MCV 92.9 96.4 96.1  PLT 363 300 482   Basic  Metabolic Panel: Recent Labs  Lab 06/11/19 1024 06/12/19 0153 06/14/19 0813  NA 138 136 139  K 4.0 3.8 3.8  CL 104 106 110  CO2 22 20* 23  GLUCOSE 240* 123* 107*  BUN '17 14 9  '$ CREATININE 1.13* 0.84 0.66  CALCIUM 9.7 8.4* 8.7*   GFR: Estimated Creatinine Clearance: 89.6 mL/min (by C-G formula based on SCr of 0.66 mg/dL). Liver Function Tests: Recent Labs  Lab 06/11/19 1024  AST 14*  ALT 8  ALKPHOS 88  BILITOT 0.6  PROT 6.9  ALBUMIN 4.0   Recent Labs  Lab 06/11/19 1024  LIPASE 25   No results for input(s): AMMONIA in the last 168 hours. Coagulation Profile: Recent Labs  Lab 06/11/19 1024  INR 1.6*   Cardiac Enzymes: No results for input(s): CKTOTAL, CKMB, CKMBINDEX, TROPONINI in the last 168 hours. BNP (last 3 results) No results for input(s): PROBNP in the last 8760 hours. HbA1C: No results for input(s): HGBA1C in the last 72 hours. CBG: Recent Labs  Lab 06/14/19 1530 06/14/19 1946 06/15/19 0005 06/15/19 0409 06/15/19 0751  GLUCAP 141* 111* 114* 106* 95   Lipid Profile: No results for input(s): CHOL, HDL, LDLCALC, TRIG, CHOLHDL, LDLDIRECT in the last 72 hours. Thyroid Function Tests: No results for input(s): TSH, T4TOTAL, FREET4, T3FREE, THYROIDAB in the last 72 hours. Anemia Panel: No results for input(s): VITAMINB12, FOLATE, FERRITIN, TIBC, IRON, RETICCTPCT in the last 72 hours. Sepsis Labs: Recent Labs  Lab 06/11/19 1205 06/11/19 1526  LATICACIDVEN 2.9* 1.5    Recent Results (from the past 240 hour(s))  Urine Culture     Status: None   Collection Time: 06/06/19  3:38 PM   Specimen: Urine  Result Value Ref Range Status   MICRO NUMBER: [id number]  Final   SPECIMEN QUALITY: Adequate  Final   Sample Source URINE  Final   STATUS: FINAL  Final   Result:   Final    Growth of mixed flora was isolated, suggesting probable contamination. No further testing will be performed. If clinically indicated, recollection using a method to minimize  contamination, with prompt transfer to Urine Culture Transport Tube, is  recommended.   Culture, blood (Routine X 2) w Reflex to ID Panel     Status: None (Preliminary result)   Collection Time: 06/11/19 12:05 PM   Specimen: BLOOD  Result Value Ref Range Status   Specimen Description   Final    BLOOD RIGHT ANTECUBITAL Performed at [facility], [address] [place], [place] [address]    Special Requests   Final    BOTTLES DRAWN AEROBIC AND ANAEROBIC Blood Culture results may not be optimal due to an inadequate volume of blood received in culture bottles Performed at [facility], [address] [place], [place] [address]    Culture   Final    NO GROWTH 3 DAYS Performed at [facility], [place] [address] [place], [place] [address]    Report Status PENDING  Incomplete  Culture, blood (Routine X 2) w Reflex to ID Panel     Status: None (Preliminary result)   Collection Time: 06/11/19 12:55 PM   Specimen: BLOOD  Result Value Ref Range Status   Specimen Description   Final    BLOOD BLOOD  RIGHT FOREARM Performed at [facility], [address] [place], [place] [address]    Special Requests   Final    BOTTLES DRAWN AEROBIC AND ANAEROBIC Blood Culture results may not be optimal due to an inadequate volume of blood received in culture bottles Performed at [facility], [address] [place], [place] [address]    Culture   Final    NO GROWTH 3 DAYS Performed at [facility], [place] [address] [place], [place] [address]    Report Status PENDING  Incomplete  Urine culture     Status: Abnormal   Collection Time: 06/11/19  3:26 PM   Specimen: In/Out Cath Urine  Result Value Ref Range Status   Specimen Description   Final    IN/OUT CATH URINE Performed at [facility], [address] [place], [place] [address]    Special Requests   Final    NONE Performed at [facility], [address] [place], [place] [address]    Culture 10,000 COLONIES/mL YEAST (A)  Final   Report Status 06/13/2019 FINAL  Final  MRSA PCR Screening     Status: None   Collection Time: 06/11/19  9:52 PM   Specimen: Nasal Mucosa; Nasopharyngeal  Result Value Ref Range Status   MRSA by PCR NEGATIVE NEGATIVE Final    Comment:        The GeneXpert MRSA Assay (FDA approved for NASAL specimens only), is one component of a comprehensive MRSA colonization surveillance program. It is not intended to diagnose MRSA infection nor to guide or monitor treatment for MRSA infections. Performed at [facility], [place] [address] [place],  [address]          Radiology Studies: No results found.      Scheduled Meds: . allopurinol  100 mg Oral QHS  . atorvastatin  40 mg Oral QHS  . cefdinir  300 mg Oral Q12H  . Chlorhexidine Gluconate Cloth  6 each Topical Daily  . diltiazem  60 mg Oral Q6H  . DULoxetine  60 mg Oral Daily  . insulin aspart  0-9 Units Subcutaneous Q4H  . levothyroxine  125 mcg Oral QAC breakfast  . metroNIDAZOLE  500 mg Oral Q8H  . nystatin   Topical BID  . pantoprazole  40 mg Oral BID  . raloxifene  60 mg Oral QHS  . rivaroxaban  20 mg Oral Q supper  . sotalol  120 mg Oral Q12H   Continuous Infusions:    LOS: 4 days     Cordelia Poche, MD [facility] 06/15/2019, 8:49 AM  If 7PM-7AM, please contact night-coverage [email or web address]

## 2019-06-16 ENCOUNTER — Inpatient Hospital Stay (HOSPITAL_COMMUNITY): Payer: Medicare Other

## 2019-06-16 DIAGNOSIS — I361 Nonrheumatic tricuspid (valve) insufficiency: Secondary | ICD-10-CM

## 2019-06-16 DIAGNOSIS — I340 Nonrheumatic mitral (valve) insufficiency: Secondary | ICD-10-CM

## 2019-06-16 DIAGNOSIS — I442 Atrioventricular block, complete: Secondary | ICD-10-CM

## 2019-06-16 HISTORY — DX: Atrioventricular block, complete: I44.2

## 2019-06-16 LAB — CULTURE, BLOOD (ROUTINE X 2)
Culture: NO GROWTH
Culture: NO GROWTH

## 2019-06-16 LAB — GLUCOSE, CAPILLARY
Glucose-Capillary: 120 mg/dL — ABNORMAL HIGH (ref 70–99)
Glucose-Capillary: 113 mg/dL — ABNORMAL HIGH (ref 70–99)
Glucose-Capillary: 144 mg/dL — ABNORMAL HIGH (ref 70–99)
Glucose-Capillary: 156 mg/dL — ABNORMAL HIGH (ref 70–99)
Glucose-Capillary: 112 mg/dL — ABNORMAL HIGH (ref 70–99)

## 2019-06-16 LAB — ECHOCARDIOGRAM COMPLETE
Height: 65 in
Weight: 3880.1 oz

## 2019-06-16 LAB — CBC
HCT: 35.9 % — ABNORMAL LOW (ref 36.0–46.0)
Hemoglobin: 10.7 g/dL — ABNORMAL LOW (ref 12.0–15.0)
MCH: 27.8 pg (ref 26.0–34.0)
MCHC: 29.8 g/dL — ABNORMAL LOW (ref 30.0–36.0)
MCV: 93.2 fL (ref 80.0–100.0)
Platelets: 231 10*3/uL (ref 150–400)
RBC: 3.85 MIL/uL — ABNORMAL LOW (ref 3.87–5.11)
RDW: 16.3 % — ABNORMAL HIGH (ref 11.5–15.5)
WBC: 5.7 10*3/uL (ref 4.0–10.5)
nRBC: 0 % (ref 0.0–0.2)

## 2019-06-16 LAB — HEPARIN LEVEL (UNFRACTIONATED): Heparin Unfractionated: 1.7 IU/mL — ABNORMAL HIGH (ref 0.30–0.70)

## 2019-06-16 LAB — APTT: aPTT: 25 seconds (ref 24–36)

## 2019-06-16 MED ORDER — METOPROLOL TARTRATE 25 MG PO TABS
25.0000 mg | ORAL_TABLET | Freq: Two times a day (BID) | ORAL | Status: DC
  Administered 2019-06-16 – 2019-06-17 (×3): 25 mg via ORAL
  Filled 2019-06-16 (×3): qty 1

## 2019-06-16 MED ORDER — DILTIAZEM HCL ER COATED BEADS 120 MG PO CP24: 240.0000 mg | ORAL_CAPSULE | Freq: Every day | ORAL | Status: DC

## 2019-06-16 MED ORDER — HEPARIN (PORCINE) 25000 UT/250ML-% IV SOLN
1300.0000 [IU]/h | INTRAVENOUS | Status: DC
  Administered 2019-06-16: 1300 [IU]/h via INTRAVENOUS
  Filled 2019-06-16: qty 250

## 2019-06-16 NOTE — Progress Notes (Signed)
[place] for heparin Indication: hx atrial fibrillation (xarelto on hold)  Allergies  Allergen Reactions  . Penicillins Swelling and Rash    Has patient had a PCN reaction causing immediate rash, facial/tongue/throat swelling, SOB or lightheadedness with hypotension: No Has patient had a PCN reaction causing severe rash involving mucus membranes or skin necrosis: No Has patient had a PCN reaction that required hospitalization: No Has patient had a PCN reaction occurring within the last 10 years: No If all of the above answers are "NO", then may proceed with Cephalosporin use.    Patient Measurements: Height: 5\' 5"  (165.1 cm) Weight: 242 lb 8.1 oz (110 kg) IBW/kg (Calculated) : 57 Heparin Dosing Weight: 85 kg  Vital Signs: Temp: 97.7 F (36.5 C) (11/01 0827) Temp Source: Oral (11/01 0827) BP: 142/99 (11/01 0800) Pulse Rate: 118 (11/01 0800)  Labs: Recent Labs    06/14/19 0813  HGB 10.1*  HCT 34.8*  PLT 248  CREATININE 0.66    Estimated Creatinine Clearance: 86.5 mL/min (by C-G formula based on SCr of 0.66 mg/dL).   Medications:  - xarelto 20 mg daily (last dose taken on 10/31 at 5PM)  Assessment: Patient's a 63 y.o F with hx recent COVID PNA, afib on xarelto PTA and on abx PTA for diverticulitis, presented to the ED on 10/27 with c/o abdominal pain and n/v. He was also found to have afib with RVR. Xarelto was resumed on admission.  Pharmacy was consulted on 11/1 to transition patient to heparin drip with plan for DCCV on 11/2 and heart cath to evaluate heart failure once patient has been off xarelto for 48 hrs.  Today, 06/16/2019: - last cbc on 10/30 was stable - no bleeding documented   Goal of Therapy:  Heparin level 0.3-0.7 units/ml aPTT 66-102 seconds Monitor platelets by anticoagulation protocol: Yes   Plan:  - start heparin drip at 1300 units/hr at 5PM today (~24 hrs after last dose of xarelto) - baseline aPTT,  heparin level and cbc at 4PM - check heparin level and aPTT 6 hrs after starting heparin drip - monitor for s/s bleeding  Melinda Mckinney P 06/16/2019,10:44 AM

## 2019-06-16 NOTE — Progress Notes (Addendum)
Progress Note  Patient Name: Melinda Mckinney Date of Encounter: 06/16/2019  Primary Cardiologist: Skeet Latch, MD   Subjective   BP improved this morning (119/80).  Rates appeared better controlled (90-100s) yesterday.  She treid walking this morning around the unit but reported significant chest pain/dyspnea with minimal exertion.  Inpatient Medications    Scheduled Meds: . allopurinol  100 mg Oral QHS  . atorvastatin  40 mg Oral QHS  . cefdinir  300 mg Oral Q12H  . Chlorhexidine Gluconate Cloth  6 each Topical Daily  . diltiazem  60 mg Oral Q6H  . DULoxetine  60 mg Oral Daily  . insulin aspart  0-9 Units Subcutaneous Q4H  . levothyroxine  125 mcg Oral QAC breakfast  . metroNIDAZOLE  500 mg Oral Q8H  . nystatin   Topical BID  . pantoprazole  40 mg Oral BID  . raloxifene  60 mg Oral QHS  . rivaroxaban  20 mg Oral Q supper  . sotalol  120 mg Oral Q12H   Continuous Infusions:  PRN Meds: acetaminophen **OR** acetaminophen, cyclobenzaprine, HYDROmorphone (DILAUDID) injection, ondansetron **OR** ondansetron (ZOFRAN) IV, zolpidem   Vital Signs    Vitals:   06/16/19 0300 06/16/19 0400 06/16/19 0500 06/16/19 0600  BP:  (!) 144/98  119/80  Pulse: (!) 119 81 100 69  Resp: 20 (!) 23 19 20   Temp:  97.9 F (36.6 C)    TempSrc:  Oral    SpO2: 91% 93% 94% 92%  Weight:   110 kg   Height:        Intake/Output Summary (Last 24 hours) at 06/16/2019 0722 Last data filed at 06/15/2019 1813 Gross per 24 hour  Intake 960 ml  Output -  Net 960 ml   Filed Weights   06/11/19 0953 06/16/19 0500  Weight: 117 kg 110 kg    Telemetry    Atrial fibrillation, rates 90-100s - Personally Reviewed  ECG    10/30: Atrial fibrillation, rate 104, QTc 444 - Personally Reviewed  Physical Exam   GEN: Laying in bed in no acute distress.   Neck: No JVD, no carotid bruits Cardiac: irregular, tachycardic, no murmurs, rubs, or gallops.  Respiratory: Clear to auscultation bilaterally  GI: NABS, Soft, obese, nontender, non-distended  MS: No edema; No deformity. Neuro:  Nonfocal, moving all extremities spontaneously Psych: Normal affect   Labs    Chemistry Recent Labs  Lab 06/11/19 1024 06/12/19 0153 06/14/19 0813  NA 138 136 139  K 4.0 3.8 3.8  CL 104 106 110  CO2 22 20* 23  GLUCOSE 240* 123* 107*  BUN 17 14 9   CREATININE 1.13* 0.84 0.66  CALCIUM 9.7 8.4* 8.7*  PROT 6.9  --   --   ALBUMIN 4.0  --   --   AST 14*  --   --   ALT 8  --   --   ALKPHOS 88  --   --   BILITOT 0.6  --   --   GFRNONAA 51* >60 >60  GFRAA 59* >60 >60  ANIONGAP 12 10 6      Hematology Recent Labs  Lab 06/11/19 1024 06/12/19 0153 06/14/19 0813  WBC 9.0 7.4 5.5  RBC 4.21 3.86* 3.62*  HGB 11.7* 10.8* 10.1*  HCT 39.1 37.2 34.8*  MCV 92.9 96.4 96.1  MCH 27.8 28.0 27.9  MCHC 29.9* 29.0* 29.0*  RDW 16.2* 16.5* 16.5*  PLT 363 300 248    Cardiac EnzymesNo results for input(s): TROPONINI in the  last 168 hours. No results for input(s): TROPIPOC in the last 168 hours.   BNPNo results for input(s): BNP, PROBNP in the last 168 hours.   DDimer No results for input(s): DDIMER in the last 168 hours.   Radiology    No results found.  Cardiac Studies   TTE  06/16/19: 1. Left ventricular ejection fraction, by visual estimation, is 25 to 30%. The left ventricle has severely decreased function. There is no left ventricular hypertrophy.  2. Left ventricular diastolic parameters are indeterminate.  3. The left ventricle demonstrates global hypokinesis.  4. Global right ventricle has mildly reduced systolic function.The right ventricular size is normal. No increase in right ventricular wall thickness.  5. Left atrial size was moderately dilated.  6. Right atrial size was mildly dilated.  7. The mitral valve is normal in structure. Moderate mitral valve regurgitation. No evidence of mitral stenosis.  8. The tricuspid valve is normal in structure. Tricuspid valve regurgitation moderate.   9. The aortic valve is normal in structure. Aortic valve regurgitation is mild. No evidence of aortic valve sclerosis or stenosis. 10. The pulmonic valve was normal in structure. Pulmonic valve regurgitation is mild. 11. Aortic dilatation noted. 12. There is mild dilatation of the ascending aorta measuring 39 mm. 13. Mildly elevated pulmonary artery systolic pressure. 14. The inferior vena cava is normal in size with greater than 50% respiratory variability, suggesting right atrial pressure of 3 mmHg.   Echocardiogram 07/2017: Study Conclusions  - Left ventricle: The cavity size was normal. Wall thickness was normal. Systolic function was mildly reduced. The estimated ejection fraction was in the range of 45% to 50%. - Aortic valve: There was mild regurgitation. - Mitral valve: There was moderate regurgitation. - Right atrium: The atrium was mildly dilated.  NST 07/2017:  There was no ST segment deviation noted during stress.  Nuclear stress EF: 28%. The left ventricular ejection fraction is severely decreased (<30%).  Defect 1: There is a small defect of mild severity present in the mid anterior, mid anteroseptal and apical septal location. This is a fixed defect and is likely due to breast or chest wall attenuation.  This is a high risk study based on reduction of LV function . There is no evidence of ischemia or infarction .  Patient Profile     63 y.o. female with a PMH of CAD s/p PCI 2010, chronic combined CHF, atrial fibrillation on xarelto, HTN, Pre-DM type 2, hypothyroidism, COVID-19 (dx 04/30/2019), who is admitted for diverticulitis, who is being followed by cardiology for the evaluation of atrial fibrillation with RVR   Assessment & Plan    Chronic combined CHF: EF 45-50% on echo in 2018, worsened on TTE today, appears 25-30%.  Home lisinopril and lasix on hold given soft BP's. Appears euvolemic on exam - Given worsening systolic function in patient with known CAD  and now having exertional chest pain/dyspnea, will plan for LHC/RHC.  She has been on Xarelto, so will hold and start heparin gtt.  Last dose Xarelto 10/31 at 5pm.  Risks and benefits of cardiac catheterization have been discussed with the patient through an interpreter.  These include bleeding, infection, kidney damage, stroke, heart attack, death.  The patient understands these risks and is willing to proceed. - Start metoprolol 25 mg BID and titrate as able for rate control of AF.  On lisinopril at home, will restart as BP allows  Atrial fibrillation with RVR: patient with intermittent RVR this admission. QTc stable on sotalol.  On xarelto given CHA2DS2-VASc Score of 6 (CHF, HTN, DM type 2 Vascular, Age 51-74, and Female).  Was on diltiazem 60 mg BID at home - TTE today showed worsening systolic dysfunction.  Will discontinue diltiazem and start on metoprolol for rate control - Continue sotalol - EKG shows QTc 444 - Stop xarelto and start heparin gtt as above  HTN: BP soft this admission.  Home lisinopril and lasix on hold.  - Resume home medications as BP allows  HLD: LDL 118 10/2018; goal <70 given CAD history - Continue atorvastatin 80mg  daily  CAD s/p PCI: Hx of abnormal stress test prompting LHC with PCI to unknown vessel in ~2010. No anginal complaints. Not on aspirin given need for anticoagulation - Continue statin    For questions or updates, please contact [place] Please consult [email or web address] for contact info under Cardiology/STEMI.      Signed, Donato Heinz, MD  06/16/2019, 7:22 AM

## 2019-06-16 NOTE — Progress Notes (Signed)
   Per, Dr. Gardiner Rhyme, plan is for right/left heart catheterization tomorrow for further evaluation of CHF with worsening EF and known CAD. Dr. Gardiner Rhyme consented patient today during rounds. Will make patient NPO at midnight and place pre-cath orders. Of note, last dose of Xarelto last night.   Darreld Mclean, PA-C 06/16/2019 3:24 PM

## 2019-06-16 NOTE — Plan of Care (Signed)
Patient understands safety concerns within the hospital.

## 2019-06-16 NOTE — Progress Notes (Signed)
  Echocardiogram 2D Echocardiogram has been performed.  Jennette Dubin 06/16/2019, 9:19 AM

## 2019-06-16 NOTE — Progress Notes (Signed)
Patient ambulated in hallway with this RN. Completed half loop of unit, requiring stopping and deep breathing multiple times during walk. Heartrate 90-130 in afib throughout walking. O2 sats remained > 93% but reported chest discomfort and shortness of breath. Patient very exhausted when back to room. MD made aware. Will continue to monitor.

## 2019-06-16 NOTE — Progress Notes (Addendum)
PROGRESS NOTE    Melinda Mckinney  JYN:[id number] DOB: Jun 24, 1956 DOA: 06/11/2019 PCP: Colon Branch, MD   Brief Narrative: Melinda Mckinney is a 63 y.o. female with a history of atrial fibrillation on Xarelto, CAD s/p stent, hypertension, systolic heart failure. Patient presented secondary to abdominal pain and found to have diverticulitis, failing outpatient treatment. Antibiotics started. Also found to have atrial fibrillation with RVR.   Assessment & Plan:   Principal Problem:   Sepsis ([place]) Active Problems:   Essential hypertension   Coronary artery disease involving native coronary artery of native heart with angina pectoris (HCC)   Atrial fibrillation with rapid ventricular response (HCC)   Chronic systolic CHF (congestive heart failure) ([place])   COVID-19   Diverticulitis   Diverticulitis Failed outpatient treatment with ciprofloxacin/flagyl. Started on Ceftriaxone/Flagyl on admission. Does not appear to have met sepsis criteria on admission. Blood cultures with no growth to date. Symptoms significantly improved. Afebrile. WBC normal -Cefdinir/flagyl  Essential hypertension Soft blood pressures. Also with history of orthostatic symptoms. -Antihypertensives as below  Dizziness Related to orthostatic vitals. PT recommending home health. Some improvement. Has some intermittent dyspnea on exertion likely related to patient's intermittent rate issues. -Orthostatic vitals  Throat pain Unsure of etiology. Patient with a history of GERD. Oral exam significant for tongue debris. No associated cervical lymphadenopathy -Rinse mouth, recheck oral cavity  Pre-diabetes No evidence of diabetes on chart review. Highest hemoglobin A1C is 6.1%. Recommend diet modification.  Paroxysmal atrial fibrillation with RVR Patient managed on diltiazem drip initially. Sotalol continued. Rate improved and she was transitioned to home regimen. Rates have worsened again and blood pressure remains soft.  -Continue sotalol and diltiazem -Continue Xarelto -Cardiology recommendations: discussed; plan for [facility] tomorrow [facility] secondary to preliminary Transthoracic Echocardiogram results; plan to transition to metoprolol from diltiazem  Chronic systolic heart failure Stable and euvolemic. Preliminary Transthoracic Echocardiogram concerning for reduced EF; official read is pending. -Daily weights/strict in and outs  Hypothyroidism Recent TSH of 1.6 -Continue Synthroid  Recent COVID-19 pneumonia Symptoms improved. Still with some dyspnea on exertion but much improved from weeks prior.   DVT prophylaxis: Xarelto Code Status:   Code Status: Full Code Family Communication: None at bedside Disposition Plan: Discharge home pending cardiology recommendations/cardioversion   Consultants:  Cardiology  Procedures:  11/1: Transthoracic Echocardiogram - pending  Antimicrobials: Ceftriaxone (10/27>> Flagyl (10/27>>    Subjective: Interpreter: Rica Mote #[id number]  Abdominal pain improved. Intermittently able to walk well without dyspnea; but at times does have dyspnea.  Objective: Vitals:   06/16/19 0500 06/16/19 0600 06/16/19 0800 06/16/19 0827  BP:  119/80 (!) 142/99   Pulse: 100 69 (!) 118   Resp: _0 Temp:    97.7 F (36.5 C)  TempSrc:    Oral  SpO2: 94% 92% 95%   Weight: 110 kg     Height:        Intake/Output Summary (Last 24 hours) at 06/16/2019 0858 Last data filed at 06/15/2019 1813 Gross per 24 hour  Intake 960 ml  Output   Net 960 ml   Filed Weights   06/11/19 0953 06/16/19 0500  Weight: 117 kg 110 kg    Examination:  General exam: Appears calm and comfortable HEENT: tongue with what appears to be food debris. No erythema noted. No cervical lymphadenopathy Respiratory system: Clear to auscultation. Respiratory effort normal. Cardiovascular system: S1 & S2 heard, Irregular rhythm and fast rate. Gastrointestinal system: Abdomen is nondistended,  soft and nontender. No  organomegaly or masses felt. Normal bowel sounds heard. Central nervous system: Alert and oriented. No focal neurological deficits. Extremities: No edema. No calf tenderness Skin: No cyanosis. No rashes Psychiatry: Judgement and insight appear normal. Mood & affect appropriate.     Data Reviewed: I have personally reviewed following labs and imaging studies  CBC: Recent Labs  Lab 06/11/19 1024 06/12/19 0153 06/14/19 0813  WBC 9.0 7.4 5.5  NEUTROABS 6.8  --   --   HGB 11.7* 10.8* 10.1*  HCT 39.1 37.2 34.8*  MCV 92.9 96.4 96.1  PLT 363 300 096   Basic Metabolic Panel: Recent Labs  Lab 06/11/19 1024 06/12/19 0153 06/14/19 0813  NA 138 136 139  K 4.0 3.8 3.8  CL 104 106 110  CO2 22 20* 23  GLUCOSE 240* 123* 107*  BUN _0 CREATININE 1.13* 0.84 0.66  CALCIUM 9.7 8.4* 8.7*   GFR: Estimated Creatinine Clearance: 86.5 mL/min (by C-G formula based on SCr of 0.66 mg/dL). Liver Function Tests: Recent Labs  Lab 06/11/19 1024  AST 14*  ALT 8  ALKPHOS 88  BILITOT 0.6  PROT 6.9  ALBUMIN 4.0   Recent Labs  Lab 06/11/19 1024  LIPASE 25   No results for input(s): AMMONIA in the last 168 hours. Coagulation Profile: Recent Labs  Lab 06/11/19 1024  INR 1.6*   Cardiac Enzymes: No results for input(s): CKTOTAL, CKMB, CKMBINDEX, TROPONINI in the last 168 hours. BNP (last 3 results) No results for input(s): PROBNP in the last 8760 hours. HbA1C: No results for input(s): HGBA1C in the last 72 hours. CBG: Recent Labs  Lab 06/15/19 1533 06/15/19 1952 06/15/19 2323 06/16/19 0309 06/16/19 0730  GLUCAP 118* 166* 119* 120* 113*   Lipid Profile: No results for input(s): CHOL, HDL, LDLCALC, TRIG, CHOLHDL, LDLDIRECT in the last 72 hours. Thyroid Function Tests: No results for input(s): TSH, T4TOTAL, FREET4, T3FREE, THYROIDAB in the last 72 hours. Anemia Panel: No results for input(s): VITAMINB12, FOLATE, FERRITIN, TIBC, IRON, RETICCTPCT in  the last 72 hours. Sepsis Labs: Recent Labs  Lab 06/11/19 1205 06/11/19 1526  LATICACIDVEN 2.9* 1.5    Recent Results (from the past 240 hour(s))  Urine Culture     Status: None   Collection Time: 06/06/19  3:38 PM   Specimen: Urine  Result Value Ref Range Status   MICRO NUMBER: [id number]  Final   SPECIMEN QUALITY: Adequate  Final   Sample Source URINE  Final   STATUS: FINAL  Final   Result:   Final    Growth of mixed flora was isolated, suggesting probable contamination. No further testing will be performed. If clinically indicated, recollection using a method to minimize contamination, with prompt transfer to Urine Culture Transport Tube, is  recommended.   Culture, blood (Routine X 2) w Reflex to ID Panel     Status: None   Collection Time: 06/11/19 12:05 PM   Specimen: BLOOD  Result Value Ref Range Status   Specimen Description   Final    BLOOD RIGHT ANTECUBITAL Performed at [facility], [address] [place], [place] [address]    Special Requests   Final    BOTTLES DRAWN AEROBIC AND ANAEROBIC Blood Culture results may not be optimal due to an inadequate volume of blood received in culture bottles Performed at [facility], [address] [place], [place] [address]    Culture   Final    NO GROWTH 5 DAYS Performed at  [facility], [place] [address], [place], [place] [address]    Report Status 06/16/2019 FINAL  Final  Culture, blood (Routine X 2) w Reflex to ID Panel     Status: None   Collection Time: 06/11/19 12:55 PM   Specimen: BLOOD  Result Value Ref Range Status   Specimen Description   Final    BLOOD BLOOD RIGHT FOREARM Performed at [facility], [address] [place], [place] [address]    Special Requests   Final    BOTTLES DRAWN AEROBIC AND ANAEROBIC Blood Culture results may not be optimal due to an inadequate volume of blood received in culture bottles Performed at [facility], [address]  [place], [place] [address]    Culture   Final    NO GROWTH 5 DAYS Performed at [facility], [place] [address], [place], [place] [address]    Report Status 06/16/2019 FINAL  Final  Urine culture     Status: Abnormal   Collection Time: 06/11/19  3:26 PM   Specimen: In/Out Cath Urine  Result Value Ref Range Status   Specimen Description   Final    IN/OUT CATH URINE Performed at [facility], [address] [place], [place] [address]    Special Requests   Final    NONE Performed at [facility], [address] [place], [place] [address]    Culture 10,000 COLONIES/mL YEAST (A)  Final   Report Status 06/13/2019 FINAL  Final  MRSA PCR Screening     Status: None   Collection Time: 06/11/19  9:52 PM   Specimen: Nasal Mucosa; Nasopharyngeal  Result Value Ref Range Status   MRSA by PCR NEGATIVE NEGATIVE Final    Comment:        The GeneXpert MRSA Assay (FDA approved for NASAL specimens only), is one component of a comprehensive MRSA colonization surveillance program. It is not intended to diagnose MRSA infection nor to guide or monitor treatment for MRSA infections. Performed at [facility], [place] [address], [place], [place] [address]          Radiology Studies: No results found.      Scheduled Meds:  allopurinol  100 mg Oral QHS   atorvastatin  40 mg Oral QHS   cefdinir  300 mg Oral Q12H   Chlorhexidine Gluconate Cloth  6 each Topical Daily   diltiazem  240 mg Oral Daily   DULoxetine  60 mg Oral Daily   insulin aspart  0-9 Units Subcutaneous Q4H   levothyroxine  125 mcg Oral QAC breakfast   metroNIDAZOLE  500 mg Oral Q8H   nystatin   Topical BID   pantoprazole  40 mg Oral BID   raloxifene  60 mg Oral QHS   rivaroxaban  20 mg Oral Q supper   sotalol  120 mg Oral Q12H   Continuous Infusions:     LOS: 5 days     Cordelia Poche, MD [facility] 06/16/2019, 8:58 AM  If 7PM-7AM, please contact night-coverage  [email or web address]

## 2019-06-17 ENCOUNTER — Inpatient Hospital Stay: Payer: Self-pay

## 2019-06-17 ENCOUNTER — Inpatient Hospital Stay (HOSPITAL_COMMUNITY)
Admission: EM | Disposition: A | Discharge status: Home Health | Payer: Self-pay | Source: Home / Self Care | Attending: Family Medicine

## 2019-06-17 ENCOUNTER — Inpatient Hospital Stay (HOSPITAL_COMMUNITY): Payer: Medicare Other

## 2019-06-17 DIAGNOSIS — I5021 Acute systolic (congestive) heart failure: Secondary | ICD-10-CM

## 2019-06-17 DIAGNOSIS — A419 Sepsis, unspecified organism: Secondary | ICD-10-CM

## 2019-06-17 HISTORY — PX: RIGHT/LEFT HEART CATH AND CORONARY ANGIOGRAPHY: CATH118266

## 2019-06-17 LAB — GLUCOSE, CAPILLARY
Glucose-Capillary: 128 mg/dL — ABNORMAL HIGH (ref 70–99)
Glucose-Capillary: 130 mg/dL — ABNORMAL HIGH (ref 70–99)
Glucose-Capillary: 103 mg/dL — ABNORMAL HIGH (ref 70–99)
Glucose-Capillary: 130 mg/dL — ABNORMAL HIGH (ref 70–99)
Glucose-Capillary: 102 mg/dL — ABNORMAL HIGH (ref 70–99)

## 2019-06-17 LAB — APTT
aPTT: 58 seconds — ABNORMAL HIGH (ref 24–36)
aPTT: 91 seconds — ABNORMAL HIGH (ref 24–36)

## 2019-06-17 LAB — POCT I-STAT EG7
Acid-base deficit: 2 mmol/L (ref 0.0–2.0)
Bicarbonate: 23.6 mmol/L (ref 20.0–28.0)
Calcium, Ion: 1.19 mmol/L (ref 1.15–1.40)
HCT: 35 % — ABNORMAL LOW (ref 36.0–46.0)
Hemoglobin: 11.9 g/dL — ABNORMAL LOW (ref 12.0–15.0)
O2 Saturation: 44 %
Potassium: 3.4 mmol/L — ABNORMAL LOW (ref 3.5–5.1)
Sodium: 142 mmol/L (ref 135–145)
TCO2: 25 mmol/L (ref 22–32)
pCO2, Ven: 44.9 mmHg (ref 44.0–60.0)
pH, Ven: 7.328 (ref 7.250–7.430)
pO2, Ven: 26 mmHg — CL (ref 32.0–45.0)

## 2019-06-17 LAB — BASIC METABOLIC PANEL
Anion gap: 10 (ref 5–15)
BUN: 10 mg/dL (ref 8–23)
CO2: 21 mmol/L — ABNORMAL LOW (ref 22–32)
Calcium: 9.2 mg/dL (ref 8.9–10.3)
Chloride: 107 mmol/L (ref 98–111)
Creatinine, Ser: 0.74 mg/dL (ref 0.44–1.00)
GFR calc Af Amer: >60 mL/min (ref >60)
GFR calc non Af Amer: >60 mL/min (ref >60)
Glucose, Bld: 135 mg/dL — ABNORMAL HIGH (ref 70–99)
Potassium: 3.8 mmol/L (ref 3.5–5.1)
Sodium: 138 mmol/L (ref 135–145)

## 2019-06-17 LAB — HEPARIN LEVEL (UNFRACTIONATED): Heparin Unfractionated: 0.89 IU/mL — ABNORMAL HIGH (ref 0.30–0.70)

## 2019-06-17 LAB — CBC
HCT: 38.7 % (ref 36.0–46.0)
Hemoglobin: 11.5 g/dL — ABNORMAL LOW (ref 12.0–15.0)
MCH: 27.8 pg (ref 26.0–34.0)
MCHC: 29.7 g/dL — ABNORMAL LOW (ref 30.0–36.0)
MCV: 93.5 fL (ref 80.0–100.0)
Platelets: 297 10*3/uL (ref 150–400)
RBC: 4.14 MIL/uL (ref 3.87–5.11)
RDW: 16.4 % — ABNORMAL HIGH (ref 11.5–15.5)
WBC: 8.4 10*3/uL (ref 4.0–10.5)
nRBC: 0 % (ref 0.0–0.2)

## 2019-06-17 SURGERY — RIGHT/LEFT HEART CATH AND CORONARY ANGIOGRAPHY
Anesthesia: LOCAL

## 2019-06-17 MED ORDER — ASPIRIN 81 MG PO CHEW: 81.0000 mg | CHEWABLE_TABLET | ORAL | Status: DC

## 2019-06-17 MED ORDER — MORPHINE SULFATE (PF) 2 MG/ML IV SOLN
2.0000 mg | INTRAVENOUS | Status: DC | PRN
  Administered 2019-06-25 – 2019-06-26 (×3): 2 mg via INTRAVENOUS
  Filled 2019-06-17 (×3): qty 1

## 2019-06-17 MED ORDER — SODIUM CHLORIDE 0.9 % IV SOLN
INTRAVENOUS | Status: DC
  Administered 2019-06-17: 50 mL/h via INTRAVENOUS

## 2019-06-17 MED ORDER — FUROSEMIDE 10 MG/ML IJ SOLN
12.0000 mg/h | INTRAVENOUS | Status: DC
  Administered 2019-06-17 – 2019-06-18 (×2): 12 mg/h via INTRAVENOUS
  Filled 2019-06-17 (×3): qty 25

## 2019-06-17 MED ORDER — AMIODARONE HCL IN DEXTROSE 360-4.14 MG/200ML-% IV SOLN
60.0000 mg/h | INTRAVENOUS | Status: DC
  Administered 2019-06-17 (×2): 60 mg/h via INTRAVENOUS
  Filled 2019-06-17: qty 200

## 2019-06-17 MED ORDER — SODIUM CHLORIDE 0.9% FLUSH: 3.0000 mL | INTRAVENOUS | Status: DC | PRN

## 2019-06-17 MED ORDER — SODIUM CHLORIDE 0.9% FLUSH
10.0000 mL | Freq: Two times a day (BID) | INTRAVENOUS | Status: DC
  Administered 2019-06-17: 10 mL
  Administered 2019-06-18: 20 mL
  Administered 2019-06-19 – 2019-06-23 (×5): 10 mL

## 2019-06-17 MED ORDER — RIVAROXABAN 20 MG PO TABS
20.0000 mg | ORAL_TABLET | Freq: Every day | ORAL | Status: DC
  Administered 2019-06-18 – 2019-06-23 (×6): 20 mg via ORAL
  Filled 2019-06-17 (×6): qty 1

## 2019-06-17 MED ORDER — SODIUM CHLORIDE 0.9 % IV SOLN: INTRAVENOUS | Status: AC

## 2019-06-17 MED ORDER — AMIODARONE LOAD VIA INFUSION
150.0000 mg | Freq: Once | INTRAVENOUS | Status: AC
  Administered 2019-06-17: 150 mg via INTRAVENOUS
  Filled 2019-06-17: qty 83.34

## 2019-06-17 MED ORDER — FENTANYL CITRATE (PF) 100 MCG/2ML IJ SOLN
INTRAMUSCULAR | Status: DC | PRN
  Administered 2019-06-17: 25 ug via INTRAVENOUS

## 2019-06-17 MED ORDER — LIDOCAINE HCL (PF) 1 % IJ SOLN
INTRAMUSCULAR | Status: DC | PRN
  Administered 2019-06-17: 15 mL

## 2019-06-17 MED ORDER — SODIUM CHLORIDE 0.9% FLUSH
3.0000 mL | Freq: Two times a day (BID) | INTRAVENOUS | Status: DC
  Administered 2019-06-17 – 2019-06-19 (×3): 3 mL via INTRAVENOUS

## 2019-06-17 MED ORDER — METOPROLOL TARTRATE 5 MG/5ML IV SOLN
5.0000 mg | Freq: Once | INTRAVENOUS | Status: AC
  Administered 2019-06-17: 02:00:00 5 mg via INTRAVENOUS

## 2019-06-17 MED ORDER — FUROSEMIDE 10 MG/ML IJ SOLN
INTRAMUSCULAR | Status: DC | PRN
  Administered 2019-06-17: 80 mg via INTRAVENOUS

## 2019-06-17 MED ORDER — MAGIC MOUTHWASH
15.0000 mL | Freq: Three times a day (TID) | ORAL | Status: DC
  Administered 2019-06-17: 15 mL via ORAL
  Filled 2019-06-17: qty 15

## 2019-06-17 MED ORDER — HEPARIN (PORCINE) IN NACL 1000-0.9 UT/500ML-% IV SOLN
INTRAVENOUS | Status: AC
  Filled 2019-06-17: qty 1000

## 2019-06-17 MED ORDER — LIDOCAINE HCL (PF) 1 % IJ SOLN
INTRAMUSCULAR | Status: AC
  Filled 2019-06-17: qty 30

## 2019-06-17 MED ORDER — FENTANYL CITRATE (PF) 100 MCG/2ML IJ SOLN
INTRAMUSCULAR | Status: AC
  Filled 2019-06-17: qty 2

## 2019-06-17 MED ORDER — HEPARIN (PORCINE) IN NACL 1000-0.9 UT/500ML-% IV SOLN
INTRAVENOUS | Status: DC | PRN
  Administered 2019-06-17 (×3): 500 mL

## 2019-06-17 MED ORDER — METOPROLOL TARTRATE 5 MG/5ML IV SOLN
INTRAVENOUS | Status: AC
  Administered 2019-06-17: 5 mg via INTRAVENOUS
  Filled 2019-06-17: qty 5

## 2019-06-17 MED ORDER — HEPARIN (PORCINE) 25000 UT/250ML-% IV SOLN
1500.0000 [IU]/h | INTRAVENOUS | Status: DC
  Administered 2019-06-17: 1500 [IU]/h via INTRAVENOUS
  Filled 2019-06-17: qty 250

## 2019-06-17 MED ORDER — ONDANSETRON HCL 4 MG/2ML IJ SOLN: 4.0000 mg | Freq: Four times a day (QID) | INTRAMUSCULAR | Status: DC | PRN

## 2019-06-17 MED ORDER — SODIUM CHLORIDE 0.9% FLUSH: 10.0000 mL | INTRAVENOUS | Status: DC | PRN

## 2019-06-17 MED ORDER — NITROGLYCERIN 1 MG/10 ML FOR IR/CATH LAB
INTRA_ARTERIAL | Status: AC
  Filled 2019-06-17: qty 10

## 2019-06-17 MED ORDER — METOPROLOL TARTRATE 25 MG PO TABS
25.0000 mg | ORAL_TABLET | Freq: Once | ORAL | Status: AC
  Administered 2019-06-17: 25 mg via ORAL
  Filled 2019-06-17: qty 1

## 2019-06-17 MED ORDER — AMIODARONE HCL IN DEXTROSE 360-4.14 MG/200ML-% IV SOLN
60.0000 mg/h | INTRAVENOUS | Status: DC
  Administered 2019-06-17 – 2019-06-18 (×2): 60 mg/h via INTRAVENOUS
  Administered 2019-06-18: 30.0001 mg/h via INTRAVENOUS
  Administered 2019-06-18 – 2019-06-24 (×18): 60 mg/h via INTRAVENOUS
  Filled 2019-06-17 (×35): qty 200

## 2019-06-17 MED ORDER — GERHARDT'S BUTT CREAM
TOPICAL_CREAM | Freq: Four times a day (QID) | CUTANEOUS | Status: DC
  Administered 2019-06-17 – 2019-06-25 (×22): via TOPICAL
  Administered 2019-06-26: 1 via TOPICAL
  Filled 2019-06-17: qty 1

## 2019-06-17 MED ORDER — ACETAMINOPHEN 325 MG PO TABS
650.0000 mg | ORAL_TABLET | ORAL | Status: DC | PRN
  Filled 2019-06-17: qty 2

## 2019-06-17 MED ORDER — MIDAZOLAM HCL 2 MG/2ML IJ SOLN
INTRAMUSCULAR | Status: AC
  Filled 2019-06-17: qty 2

## 2019-06-17 MED ORDER — AMIODARONE HCL IN DEXTROSE 360-4.14 MG/200ML-% IV SOLN
30.0000 mg/h | INTRAVENOUS | Status: DC
  Filled 2019-06-17: qty 200

## 2019-06-17 MED ORDER — MILRINONE LACTATE IN DEXTROSE 20-5 MG/100ML-% IV SOLN
0.1250 ug/kg/min | INTRAVENOUS | Status: DC
  Administered 2019-06-17 – 2019-06-18 (×2): 0.25 ug/kg/min via INTRAVENOUS
  Administered 2019-06-19: 0.125 ug/kg/min via INTRAVENOUS
  Filled 2019-06-17 (×3): qty 100

## 2019-06-17 MED ORDER — MAGIC MOUTHWASH
15.0000 mL | Freq: Four times a day (QID) | ORAL | Status: DC
  Administered 2019-06-17 – 2019-06-25 (×17): 15 mL via ORAL
  Filled 2019-06-17 (×29): qty 15

## 2019-06-17 MED ORDER — SODIUM CHLORIDE 0.9% FLUSH
10.0000 mL | Freq: Two times a day (BID) | INTRAVENOUS | Status: DC
  Administered 2019-06-17 – 2019-06-22 (×7): 10 mL

## 2019-06-17 MED ORDER — FUROSEMIDE 10 MG/ML IJ SOLN
INTRAMUSCULAR | Status: AC
  Filled 2019-06-17: qty 4

## 2019-06-17 MED ORDER — LABETALOL HCL 5 MG/ML IV SOLN: 10.0000 mg | INTRAVENOUS | Status: AC | PRN

## 2019-06-17 MED ORDER — FLUCONAZOLE IN SODIUM CHLORIDE 200-0.9 MG/100ML-% IV SOLN
200.0000 mg | Freq: Once | INTRAVENOUS | Status: AC
  Administered 2019-06-17: 200 mg via INTRAVENOUS
  Filled 2019-06-17: qty 100

## 2019-06-17 MED ORDER — IOHEXOL 350 MG/ML SOLN
INTRAVENOUS | Status: DC | PRN
  Administered 2019-06-17: 35 mL

## 2019-06-17 MED ORDER — DIGOXIN 125 MCG PO TABS
0.1250 mg | ORAL_TABLET | Freq: Every day | ORAL | Status: DC
  Administered 2019-06-18 – 2019-06-26 (×9): 0.125 mg via ORAL
  Filled 2019-06-17 (×9): qty 1

## 2019-06-17 MED ORDER — ASPIRIN 81 MG PO CHEW
81.0000 mg | CHEWABLE_TABLET | ORAL | Status: AC
  Administered 2019-06-17: 81 mg via ORAL
  Filled 2019-06-17: qty 1

## 2019-06-17 MED ORDER — METOPROLOL TARTRATE 25 MG PO TABS: 50.0000 mg | ORAL_TABLET | Freq: Four times a day (QID) | ORAL | Status: DC

## 2019-06-17 MED ORDER — SODIUM CHLORIDE 0.9 % IV SOLN
INTRAVENOUS | Status: DC
  Administered 2019-06-17: 15:00:00 via INTRAVENOUS

## 2019-06-17 MED ORDER — HYDRALAZINE HCL 20 MG/ML IJ SOLN: 10.0000 mg | INTRAMUSCULAR | Status: AC | PRN

## 2019-06-17 MED ORDER — SACUBITRIL-VALSARTAN 24-26 MG PO TABS
1.0000 | ORAL_TABLET | Freq: Two times a day (BID) | ORAL | Status: DC
  Administered 2019-06-17: 1 via ORAL
  Filled 2019-06-17 (×2): qty 1

## 2019-06-17 MED ORDER — SODIUM CHLORIDE 0.9 % IV SOLN
250.0000 mL | INTRAVENOUS | Status: DC | PRN
  Administered 2019-06-18: 250 mL via INTRAVENOUS
  Administered 2019-06-21: 12:00:00 via INTRAVENOUS

## 2019-06-17 MED ORDER — NITROGLYCERIN 0.2 MG/ML ON CALL CATH LAB
INTRAVENOUS | Status: AC
  Filled 2019-06-17: qty 1

## 2019-06-17 MED ORDER — SPIRONOLACTONE 12.5 MG HALF TABLET
12.5000 mg | ORAL_TABLET | Freq: Every day | ORAL | Status: DC
  Administered 2019-06-18 – 2019-06-20 (×3): 12.5 mg via ORAL
  Filled 2019-06-17 (×3): qty 1

## 2019-06-17 MED ORDER — HEPARIN (PORCINE) IN NACL 1000-0.9 UT/500ML-% IV SOLN
INTRAVENOUS | Status: AC
  Filled 2019-06-17: qty 500

## 2019-06-17 MED ORDER — MIDAZOLAM HCL 2 MG/2ML IJ SOLN
INTRAMUSCULAR | Status: DC | PRN
  Administered 2019-06-17: 1 mg via INTRAVENOUS

## 2019-06-17 MED ORDER — FUROSEMIDE 10 MG/ML IJ SOLN
60.0000 mg | Freq: Once | INTRAMUSCULAR | Status: AC
  Administered 2019-06-17: 60 mg via INTRAVENOUS
  Filled 2019-06-17: qty 6

## 2019-06-17 MED ORDER — VERAPAMIL HCL 2.5 MG/ML IV SOLN
INTRAVENOUS | Status: AC
  Filled 2019-06-17: qty 2

## 2019-06-17 SURGICAL SUPPLY — 19 items
BAG SNAP BAND KOVER 36X36 (MISCELLANEOUS) ×1 IMPLANT
CATH DXT MULTI JL4 JR4 ANG PIG (CATHETERS) ×1 IMPLANT
CATH OPTITORQUE TIG 4.0 5F (CATHETERS) IMPLANT
CATH SWAN GANZ 7F STRAIGHT (CATHETERS) ×1 IMPLANT
CLOSURE MYNX CONTROL 5F (Vascular Products) ×1 IMPLANT
CLOSURE MYNX CONTROL 6F/7F (Vascular Products) ×1 IMPLANT
GLIDESHEATH SLEND A-KIT 6F 22G (SHEATH) IMPLANT
GUIDEWIRE INQWIRE 1.5J.035X260 (WIRE) IMPLANT
INQWIRE 1.5J .035X260CM (WIRE) ×2
KIT HEART LEFT (KITS) ×2 IMPLANT
PACK CARDIAC CATHETERIZATION (CUSTOM PROCEDURE TRAY) ×2 IMPLANT
SHEATH GLIDE SLENDER 4/5FR (SHEATH) IMPLANT
SHEATH PINNACLE 5F 10CM (SHEATH) ×1 IMPLANT
SHEATH PINNACLE 7F 10CM (SHEATH) ×1 IMPLANT
TRANSDUCER W/STOPCOCK (MISCELLANEOUS) ×2 IMPLANT
TUBING CIL FLEX 10 FLL-RA (TUBING) ×1 IMPLANT
WIRE EMERALD 3MM-J .025X260CM (WIRE) ×1 IMPLANT
WIRE EMERALD 3MM-J .035X150CM (WIRE) ×1 IMPLANT
WIRE HI TORQ VERSACORE-J 145CM (WIRE) IMPLANT

## 2019-06-17 NOTE — Interval H&P Note (Signed)
Cath Lab Visit (complete for each Cath Lab visit)  Clinical Evaluation Leading to the Procedure:   ACS: Yes.    Non-ACS:    Anginal Classification: CCS II  Anti-ischemic medical therapy: Minimal Therapy (1 class of medications)  Non-Invasive Test Results: No non-invasive testing performed  Prior CABG: No previous CABG      History and Physical Interval Note:  06/17/2019 2:50 PM  [place]  has presented today for surgery, with the diagnosis of HF.  The various methods of treatment have been discussed with the patient and family. After consideration of risks, benefits and other options for treatment, the patient has consented to  Procedure(s): RIGHT/LEFT HEART CATH AND CORONARY ANGIOGRAPHY (N/A) as a surgical intervention.  The patient's history has been reviewed, patient examined, no change in status, stable for surgery.  I have reviewed the patient's chart and labs.  Questions were answered to the patient's satisfaction.     Quay Burow

## 2019-06-17 NOTE — Progress Notes (Signed)
Melinda Mckinney for heparin Indication: hx atrial fibrillation (xarelto on hold)  Allergies  Allergen Reactions  . Penicillins Swelling and Rash    Has patient had a PCN reaction causing immediate rash, facial/tongue/throat swelling, SOB or lightheadedness with hypotension: No Has patient had a PCN reaction causing severe rash involving mucus membranes or skin necrosis: No Has patient had a PCN reaction that required hospitalization: No Has patient had a PCN reaction occurring within the last 10 years: No If all of the above answers are "NO", then may proceed with Cephalosporin use.    Patient Measurements: Height: 5\' 5"  (165.1 cm) Weight: (BED BROKEN) IBW/kg (Calculated) : 57 Heparin Dosing Weight: 85 kg  Vital Signs: Temp: 98.3 F (36.8 C) (11/02 0703) Temp Source: Oral (11/02 0703) BP: 106/56 (11/02 0402) Pulse Rate: 174 (11/02 0402)  Labs: Recent Labs    06/16/19 1613 06/16/19 2330  HGB 10.7*  --   HCT 35.9*  --   PLT 231  --   APTT 25 58*  HEPARINUNFRC 1.70*  --     Estimated Creatinine Clearance: 86.5 mL/min (by C-G formula based on SCr of 0.66 mg/dL).   Medications:  - xarelto 20 mg daily (last dose taken on 10/31 at 5PM)  Assessment: Patient's a 63 y.o F with hx recent COVID PNA, afib on xarelto PTA and on abx PTA for diverticulitis, presented to the ED on 10/27 with c/o abdominal pain and n/v. He was also found to have afib with RVR. Xarelto was resumed on admission.  Pharmacy was consulted on 11/1 to transition patient to heparin drip with plan for DCCV and heart cath to evaluate heart failure on 11/2.  Today, 06/17/2019: - heparin level is elevated at 0.89, but this is likely from residual effect of xarelto ; aPTT is therapeutic at 91 secs --> will adjust heparin level based on aPTT for now - cbc stable - no bleeding documented   Goal of Therapy:  Heparin level 0.3-0.7 units/ml aPTT 66-102 seconds Monitor platelets by  anticoagulation protocol: Yes   Plan:  - continue heparin drip at 1500 units/hr  - f/u after cath procedure  - monitor for s/s bleeding  Melinda Mckinney P 06/17/2019,7:38 AM

## 2019-06-17 NOTE — Progress Notes (Signed)
PT Cancellation Note  Patient Details Name: Melinda Mckinney MRN: [id number] DOB: 07/28/1956   Cancelled Treatment:    Reason Eval/Treat Not Completed: Medical issues which prohibited therapy  Pt's HR up to 170's and to have cardiac cath this afternoon.  Will f/u when pt more appropriate/stable for PT.    Mikael Spray [place] 06/17/2019, 11:10 AM

## 2019-06-17 NOTE — Progress Notes (Signed)
[place] for heparin Indication: hx atrial fibrillation (xarelto on hold)  Allergies  Allergen Reactions  . Penicillins Swelling and Rash    Has patient had a PCN reaction causing immediate rash, facial/tongue/throat swelling, SOB or lightheadedness with hypotension: No Has patient had a PCN reaction causing severe rash involving mucus membranes or skin necrosis: No Has patient had a PCN reaction that required hospitalization: No Has patient had a PCN reaction occurring within the last 10 years: No If all of the above answers are "NO", then may proceed with Cephalosporin use.    Patient Measurements: Height: 5\' 5"  (165.1 cm) Weight: 242 lb 8.1 oz (110 kg) IBW/kg (Calculated) : 57 Heparin Dosing Weight: 85 kg  Vital Signs: Temp: 98.2 F (36.8 C) (11/01 2300) Temp Source: Oral (11/01 2300) BP: 146/109 (11/02 0000) Pulse Rate: 121 (11/02 0000)  Labs: Recent Labs    06/14/19 0813 06/16/19 1613 06/16/19 2330  HGB 10.1* 10.7*  --   HCT 34.8* 35.9*  --   PLT 248 231  --   APTT  --  25 58*  HEPARINUNFRC  --  1.70*  --   CREATININE 0.66  --   --     Estimated Creatinine Clearance: 86.5 mL/min (by C-G formula based on SCr of 0.66 mg/dL).   Medications:  - xarelto 20 mg daily (last dose taken on 10/31 at 5PM)  Assessment: Patient's a 63 y.o F with hx recent COVID PNA, afib on xarelto PTA and on abx PTA for diverticulitis, presented to the ED on 10/27 with c/o abdominal pain and n/v. He was also found to have afib with RVR. Xarelto was resumed on admission.  Pharmacy was consulted on 11/1 to transition patient to heparin drip with plan for DCCV on 11/2 and heart cath to evaluate heart failure once patient has been off xarelto for 48 hrs.  11/1 - last cbc on 10/30 was stable - no bleeding documented -2330 aptt = 58 sec , below goal, no bleeding or infusion issues per RN.    Goal of Therapy:  Heparin level 0.3-0.7 units/ml aPTT  66-102 seconds Monitor platelets by anticoagulation protocol: Yes   Plan:  -increase heparin drip to 1500 units/hr -Recheck aptt in 6 hours - Daily CBC/HL - monitor for s/s bleeding  Lawana Pai R 06/17/2019,12:35 AM

## 2019-06-17 NOTE — Progress Notes (Signed)
Report given to [facility] and patient transferred to [facility] for catheterization. Patient states she is a little nauseous at this time. EKG done by [facility].

## 2019-06-17 NOTE — Consult Note (Addendum)
Advanced Heart Failure Team Consult Note   Primary Physician: Colon Branch, MD PCP-Cardiologist:  Skeet Latch, MD  EP: Dr. Rayann Heman AHF: Dr. Aundra Dubin (new)  Reason for Consultation: Acute Systolic Heart Failure w/ Low CO  HPI:    Melinda Mckinney is seen today for evaluation of systolic HF w/ low CO at the request of Dr.O'Neal, General Cardiology.   She is a 63 y/o female w/ h/o CAD s/p PCI in 2010, chronic combined systolic and diastolic HF (EF 94-50% in 2018), atrial fibrillation on chronic a/c w/ Xarelto, HTN, prediabetes, hypothyroidism and diagnosed w/ COVID 19 in Sep 2020 and was treated at [place] infection c/b PNA. She was discharged and readmitted back to [facility] 10/9-10/13 for gastroenteritis, felt to be a sequela of COVID-19. She was managed w/ supportive care and discharged home. Had f/u with PCP 10/22 and endorsed worsening LLQ pain leading to abdominal CT which showed acute diverticulitis. She was started on outpatient abc, cipro + flagyl but symptoms failed to improve, prompting her to seek emergency medical care at [facility].  Initial EKG in the ED showed afib w/ RVR for which general cardiology was consulted. She was started on IV dilt for rate control but later required treatment w/ IV amiodarone due to persistent rapid ventricular response. Hospital course c/b volume overload, requiring IV diuretics. 2D echo was obtained and showed worsening LVEF, now 25-30% w/ moderate MR. RV systolic function mildly reduced. She underwent subsequent R/LHC today which demonstrated widely patent coronaries. RHC demonstrated elevated mean RA pressure at 22, PWP 43, PA pressure 42, LVEDP 45 and low CO. CI by Fick 1.6/L/min/m2. She was given a dose of IV Lasix 80 mg in the cath lab. So far 600 cc out in urine. BP elevated at 131/101. SCr 0.74. Advanced HF consulted to assist w/ further management.   She is currently in post cath holding on bed rest. Cath access through rt femoral  artery. Comfortable at rest. On 2L [place]. IM is treating her diverticulitis. Remains on abx, Cefdinir/ flagyl. Blood cultures negative.     2D Echo 06/16/19 Left ventricular ejection fraction, by visual estimation, is 25 to 30%. The left ventricle has severely decreased function. There is no left ventricular hypertrophy. 2. Left ventricular diastolic parameters are indeterminate. 3. The left ventricle demonstrates global hypokinesis. 4. Global right ventricle has mildly reduced systolic function.The right ventricular size is normal. No increase in right ventricular wall thickness. 5. Left atrial size was moderately dilated. 6. Right atrial size was mildly dilated. 7. The mitral valve is normal in structure. Moderate mitral valve regurgitation. No evidence of mitral stenosis. 8. The tricuspid valve is normal in structure. Tricuspid valve regurgitation moderate. 9. The aortic valve is normal in structure. Aortic valve regurgitation is mild. No evidence of aortic valve sclerosis or stenosis. 10. The pulmonic valve was normal in structure. Pulmonic valve regurgitation is mild. 11. Aortic dilatation noted. 12. There is mild dilatation of the ascending aorta measuring 39 mm. 13. Mildly elevated pulmonary artery systolic pressure. 14. The inferior vena cava is normal in size with greater than 50% respiratory variability, suggesting right atrial pressure of 3 mmHg.  R/LHC 06/17/19 Right Heart  Right Heart Pressures Right atrial pressure-mean 22 Right ventricular pressure-systolic 54, diastolic 12 Pulmonary wedge pressure-V wave 50, mean 43 Pulmonary artery pressure-54/21, mean 42 LVEDP-45 Cardiac index by Fick 1.6 L/min/m, by thermodilution 2.7 L/min/m   LHC- Normal Coronaries    Review of Systems: [y] =  yes, _0  = no    General: Weight gain _1 ; Weight loss _2 ; Anorexia _3 ; Fatigue _4 ; Fever _5 ; Chills _6 ; Weakness _7    Cardiac: Chest pain/pressure _8 ; Resting SOB _9 ;  Exertional SOB [Y ]; Orthopnea _10 ; Pedal Edema _11 ; Palpitations _12 ; Syncope _13 ; Presyncope _14 ; Paroxysmal nocturnal dyspnea_15    Pulmonary: Cough _16 ; Wheezing_17 ; Hemoptysis_18 ; Sputum _19 ; Snoring _20    GI: Vomiting_21 ; Dysphagia_22 ; Melena_23 ; Hematochezia _24 ; Heartburn_25 ; Abdominal pain _26 ; Constipation _27 ; Diarrhea _28 ; BRBPR _29    GU: Hematuria_30 ; Dysuria _31 ; Nocturia_32    Vascular: Pain in legs with walking _33 ; Pain in feet with lying flat _34 ; Non-healing sores _35 ; Stroke _36 ; TIA _37 ; Slurred speech _38 ;   Neuro: Headaches_39 ; Vertigo_40 ; Seizures_41 ; Paresthesias_42 ;Blurred vision _43 ; Diplopia _44 ; Vision changes _45    Ortho/Skin: Arthritis _46 ; Joint pain _47 ; Muscle pain _48 ; Joint swelling _49 ; Back Pain _50 ; Rash _51    Psych: Depression_52 ; Anxiety_53    Heme: Bleeding problems _54 ; Clotting disorders _55 ; Anemia _56    Endocrine: Diabetes _57 ; Thyroid dysfunction_58   Home Medications Prior to Admission medications   Medication Sig Start Date End Date Taking? Authorizing Provider  allopurinol (ZYLOPRIM) 100 MG tablet Take 0.5 tablets (50 mg total) by mouth daily. Patient taking differently: Take 100 mg by mouth at bedtime.  12/28/18  Yes Paz, Alda Berthold, MD  atorvastatin (LIPITOR) 40 MG tablet Take 1 tablet (40 mg total) by mouth at bedtime. 10/31/18  Yes Paz, Alda Berthold, MD  Cholecalciferol (VITAMIN D3) 5000 UNITS CAPS Take 5,000 Units by mouth daily.    Yes [provider]  cyclobenzaprine (FLEXERIL) 10 MG tablet Take 1 tablet (10 mg total) by mouth 2 (two) times daily as needed for muscle spasms. 10/01/18  Yes Paz, Alda Berthold, MD  diltiazem (CARDIZEM) 120 MG tablet TAKE 1/2 TABLET BY MOUTH TWICE DAILY Patient taking differently: Take 60 mg by mouth 2 (two) times daily.  03/29/19  Yes Paz, Alda Berthold, MD  DULoxetine (CYMBALTA) 60 MG capsule Take 1 capsule (60 mg total) by mouth daily. 01/28/19  Yes Paz, Alda Berthold, MD  furosemide (LASIX) 20 MG tablet Take 1 tablet (20 mg  total) by mouth daily. 12/17/18  Yes Paz, Alda Berthold, MD  levothyroxine (SYNTHROID, LEVOTHROID) 125 MCG tablet TAKE ONE TABLET BY MOUTH DAILY BEFORE BREAKFAST Patient taking differently: Take 125 mcg by mouth daily before breakfast.  09/12/18  Yes Philemon Kingdom, MD  lisinopril (ZESTRIL) 10 MG tablet Take 1 tablet (10 mg total) by mouth daily. 01/23/19  Yes Colon Branch, MD  metFORMIN (GLUCOPHAGE) 500 MG tablet Take 1 tablet (500 mg total) by mouth 2 (two) times daily with a meal. 06/10/19  Yes Paz, Alda Berthold, MD  omeprazole (PRILOSEC) 20 MG capsule Take 1 capsule (20 mg total) by mouth 2 (two) times daily before a meal. 06/10/19  Yes Colon Branch, MD  PRESCRIPTION MEDICATION 4 g See admin instructions. Testosterone 2 % cream  APPLY FOUR CLICKS (ONE GRAM) TO INNER THIGH DAILY. ROTATE   Yes [provider]  raloxifene (EVISTA) 60 MG tablet Take 1 tablet (60 mg total) by mouth daily. Patient taking differently: Take 60 mg by mouth at bedtime.  09/08/16  Yes  Terrance Mass, MD  rivaroxaban (XARELTO) 20 MG TABS tablet Take 1 tablet (20 mg total) by mouth daily with supper. 11/05/18  Yes Paz, [place], MD  SOTALOL AF 120 MG TABS TAKE ONE TABLET BY MOUTH EVERY 12 HOURS Patient taking differently: Take 120 mg by mouth every 12 (twelve) hours.  10/02/18  Yes Allred, Jeneen Rinks, MD  zolpidem (AMBIEN) 5 MG tablet Take 1 tablet (5 mg total) by mouth at bedtime as needed. for sleep Patient taking differently: Take 5 mg by mouth at bedtime as needed for sleep. for sleep 01/11/19  Yes Paz, Alda Berthold, MD  ciprofloxacin (CIPRO) 500 MG tablet Take 500 mg by mouth 2 (two) times daily.    [provider]  metroNIDAZOLE (FLAGYL) 500 MG tablet Take 500 mg by mouth 3 (three) times daily.    [provider]    Past Medical History: Past Medical History:  Diagnosis Date   Anemia    Anxiety and depression    Asthma    Atrial fibrillation ([place]) 07/2017   Borderline diabetes    CAD (coronary artery  disease) ~2009   stent    Cervical cancer ([place])    "stage 1; had cryotherapy"   Chronic lower back pain    DDD (degenerative disc disease), lumbar    Dr. Nelva Bush, recommends lumbar epidural steroid injections L5-S1 to the right   Depression    Fibromyalgia    GERD (gastroesophageal reflux disease)    High cholesterol    History of hiatal hernia    History of kidney stones    Hypertension    Hypothyroidism    Osteoporosis    Thyroid disease     Past Surgical History: Past Surgical History:  Procedure Laterality Date   CARDIAC CATHETERIZATION  ~ 2008   CARDIOVERSION N/A 08/05/2017   Procedure: CARDIOVERSION;  Surgeon: Evans Lance, MD;  Location: [place];  Service: Cardiovascular;  Laterality: N/A;   CARDIOVERSION N/A 08/24/2017   Procedure: CARDIOVERSION;  Surgeon: Sanda Klein, MD;  Location: [facility];  Service: Cardiovascular;  Laterality: N/A;   [place]; ~ 1978/1979; 1984   ESOPHAGOMYOTOMY  2009   GYNECOLOGIC CRYOSURGERY  X 2   "stage 1 cancer"   HERNIA REPAIR  X 6   "all in my stomach" (07/27/2017)   KNEE ARTHROSCOPY Right    NEPHRECTOMY Right 1991   in [place], due to fibrosis and lithiasis   TUBAL LIGATION      Family History: Family History  Problem Relation Age of Onset   Breast cancer Other        aunt    CAD Brother    Lung cancer Mother        F and M   Diabetes Father        F and mother    CAD Father    Lung cancer Father    Stroke Sister    Colon cancer Neg Hx     Social History: Social History   Socioeconomic History   Marital status: Married    Spouse name: Not on file   Number of children: 3   Years of education: Not on file   Highest education level: Not on file  Occupational History   Occupation: stay home   Social Needs   Financial resource strain: Not on file   Food insecurity    Worry: Not on file    Inability: Not on file   Transportation needs    Medical:  Not on  file    Non-medical: Not on file  Tobacco Use   Smoking status: Former Smoker    Packs/day: 2.00    Years: 28.00    Pack years: 56.00    Types: Cigarettes    Quit date: 2000    Years since quitting: 20.8   Smokeless tobacco: Never Used  Substance and Sexual Activity   Alcohol use: Yes    Comment: 07/27/2017 "3-4 drinks//month"   Drug use: No   Sexual activity: Not Currently    Comment: 1ST INTERCOURSE- 16, PARTNERS - 2  Lifestyle   Physical activity    Days per week: Not on file    Minutes per session: Not on file   Stress: Not on file  Relationships   Social connections    Talks on phone: Not on file    Gets together: Not on file    Attends religious service: Not on file    Active member of club or organization: Not on file    Attends meetings of clubs or organizations: Not on file    Relationship status: Not on file  Other Topics Concern   Not on file  Social History Narrative   Original from [place]   3 children, 2 alive   Household --pt and husband     Allergies:  Allergies  Allergen Reactions   Penicillins Swelling and Rash    Has patient had a PCN reaction causing immediate rash, facial/tongue/throat swelling, SOB or lightheadedness with hypotension: No Has patient had a PCN reaction causing severe rash involving mucus membranes or skin necrosis: No Has patient had a PCN reaction that required hospitalization: No Has patient had a PCN reaction occurring within the last 10 years: No If all of the above answers are "NO", then may proceed with Cephalosporin use.    Objective:    Vital Signs:   Temp:  [97.9 F (36.6 C)-98.5 F (36.9 C)] 97.9 F (36.6 C) (11/02 1300) Pulse Rate:  [25-174] 131 (11/02 1552) Resp:  [20-37] 24 (11/02 1552) BP: (100-170)/(56-116) 168/116 (11/02 1552) SpO2:  [0 %-100 %] 96 % (11/02 1552) Last BM Date: 06/16/19  Weight change: Filed Weights   06/16/19 0500  Weight: 110 kg    Intake/Output:   Intake/Output  Summary (Last 24 hours) at 06/17/2019 1605 Last data filed at 06/17/2019 1557 Gross per 24 hour  Intake 658.15 ml  Output 900 ml  Net -241.85 ml      Physical Exam    General:  Morbidly obese Spanish female,  No resp difficulty HEENT: normal Neck: supple. Elevated JVP to level of ear . Carotids 2+ bilat; no bruits. No lymphadenopathy or thyromegaly appreciated. Cor: PMI nondisplaced. irregularly irregular, tachy rate. No rubs, gallops or murmurs. Lungs: clear Abdomen: soft, nontender, nondistended. No hepatosplenomegaly. No bruits or masses. Good bowel sounds. Extremities: no cyanosis, clubbing, rash, edema Neuro: alert & orientedx3, cranial nerves grossly intact. moves all 4 extremities w/o difficulty. Affect pleasant   Telemetry   Atrial fibrillation RVR 110s   EKG    Admit EKG showed afib w/ RVR 147 bpm   Labs   Basic Metabolic Panel: Recent Labs  Lab 06/11/19 1024 06/12/19 0153 06/14/19 0813 06/17/19 0751  NA 138 136 139 138  K 4.0 3.8 3.8 3.8  CL 104 106 110 107  CO2 22 20* 23 21*  GLUCOSE 240* 123* 107* 135*  BUN _0 CREATININE 1.13* 0.84 0.66 0.74  CALCIUM 9.7 8.4* 8.7* 9.2  Liver Function Tests: Recent Labs  Lab 06/11/19 1024  AST 14*  ALT 8  ALKPHOS 88  BILITOT 0.6  PROT 6.9  ALBUMIN 4.0   Recent Labs  Lab 06/11/19 1024  LIPASE 25   No results for input(s): AMMONIA in the last 168 hours.  CBC: Recent Labs  Lab 06/11/19 1024 06/12/19 0153 06/14/19 0813 06/16/19 1613 06/17/19 0751  WBC 9.0 7.4 5.5 5.7 8.4  NEUTROABS 6.8  --   --   --   --   HGB 11.7* 10.8* 10.1* 10.7* 11.5*  HCT 39.1 37.2 34.8* 35.9* 38.7  MCV 92.9 96.4 96.1 93.2 93.5  PLT 363 300 248 231 297    Cardiac Enzymes: No results for input(s): CKTOTAL, CKMB, CKMBINDEX, TROPONINI in the last 168 hours.  BNP: BNP (last 3 results) Recent Labs    05/26/19 0720 05/27/19 0245 05/28/19 0630  BNP 604.0* 330.7* 345.9*    ProBNP (last 3 results) No results  for input(s): PROBNP in the last 8760 hours.   CBG: Recent Labs  Lab 06/16/19 1954 06/16/19 2306 06/17/19 0409 06/17/19 0748 06/17/19 1253  GLUCAP 156* 112* 128* 130* 103*    Coagulation Studies: No results for input(s): LABPROT, INR in the last 72 hours.   Imaging    No results found.   Medications:     Current Medications:  [MAR Hold] allopurinol  100 mg Oral QHS   [MAR Hold] atorvastatin  40 mg Oral QHS   [MAR Hold] cefdinir  300 mg Oral Q12H   [MAR Hold] Chlorhexidine Gluconate Cloth  6 each Topical Daily   [MAR Hold] DULoxetine  60 mg Oral Daily   [MAR Hold] insulin aspart  0-9 Units Subcutaneous Q4H   [MAR Hold] levothyroxine  125 mcg Oral QAC breakfast   [MAR Hold] magic mouthwash  15 mL Oral QID   [MAR Hold] metoprolol tartrate  50 mg Oral Q6H   [MAR Hold] metroNIDAZOLE  500 mg Oral Q8H   [MAR Hold] nystatin   Topical BID   [MAR Hold] pantoprazole  40 mg Oral BID   [MAR Hold] raloxifene  60 mg Oral QHS   [MAR Hold] sodium chloride flush  10-40 mL Intracatheter Q12H     Infusions:  [START ON 06/18/2019] sodium chloride 10 mL/hr at 06/17/19 1516   amiodarone 60 mg/hr (06/17/19 1400)   Followed by   amiodarone     heparin Stopped (06/17/19 1445)       Patient Profile   63 y/o female w/ h/o CAD s/p PCI in 2010, chronic combined systolic and diastolic HF (EF 30-09% in 2018), atrial fibrillation on chronic a/c w/ Xarelto, HTN, prediabetes, hypothyroidism and diagnosed w/ COVID 19 in Sep 2020 and was treated at [place] infection c/b PNA and gastroenteritis. Admitted to [facility] 10/27 for diverticulitis and afib w/ RVR and found to have worsening systolic function w/ EF now at 25-30%. Subsequent LHC showed widely patent coronaries. RHC c/w severely elevated filling pressures and low CO.   Assessment/Plan   1. Acute on Chronic Systolic HF: prior echo in 2018 showed mildly reduced LVEF, 45-50%. EF now 25-30%, in the setting  of recent COVID 19 infection and afib w/ RVR. [place] 11/2 showed widely patent coronaries, severely elevated filling pressures and reduced CO. CI by Fick 1.6/L/min/m2. ? If CM is tachy mediated from rapid afib vs myocarditis from COVID 19 infection. She is currently volume overloaded w/ elevated BP. SCr 0.74. -Transition to IV Lasix gtt 12 mg/hr. -Will  place PICC line and start inotropic therapy w/ milrinone to help w/ CO, reduce afterload and help w/ diuresis. 0.25 milrinone  -Will need to continue IV amiodarone for suppression of afib  -Monitor Co-ox and CVP -Needs cMRi to exclude myocarditis. Will check ESR and CRP -Plan to initiate guidelines directed medical therapy for systolic HF -Start ARB/ARNI, Entresto 24-49 bid  -Add Spiro 12.5 daily  -No  blocker due to reduce CO -Add digoxin 0.125  -Strict I/Os and daily wts.  -Low salt diet -Monitor electrolytes. Keep K >4.0 and Mg >2.0 given afib and therapy w/ milrinone. Monitor on tele.    2. Atrial Fibrillation w/ RVR: remains in afib w/ RVR but rates improved w/ IV amiodarone. Followed by Dr. Rayann Heman and had been on sotalol as outpatient + Xarelto. Notes indicate to consider afib ablation if recurrence. LA moderately dilated. RA mildly dilated by echo. H/o hypothyroidism, on levothyroxine.  -Check TFTs.  -If no chemical conversion w/ IV amiodarone, would recommend TEE guided DCCV this admit. If unable to convert/ hold SR, will need EP consult for afib ablation.  -On Xarelto as outpatient but held for cath. On IV heparin. Resume Xarelto tomorrow.  -Keep K >4.0 and Mg >2.0. Monitor while on milrinone -add digoxin per above   3. Mitral Regurgitation: moderate by echo. Suspect functional MR in the setting of acute systolic HF w/ elevated filling pressures. Continue w/ diuresis.    4. Diverticulitis: abx per IM. On Cefdinir/ flagyl. Blood cultures negative. WBC ct WNL. Afebrile.    Length of Stay: [address] [place]  06/17/2019, 4:05  PM  Advanced Heart Failure Team Pager [phone] (M-F; 7a - 4p)  Please contact [facility] for night-coverage after hours (4p -7a ) and weekends on [email or web address]  Patient seen with PA, agree with the above note.   She has a history of paroxysmal atrial fibrillation with prior EF 45-50%.  She was admitted to [facility] in 9/20 with COVID-19 PNA.  She was noted to be in atrial fibrillation by ECG at that time. She did not have an echo done.  She has now been admitted with diverticulitis, again noted to be in atrial fibrillation with RVR. Echo was done, showing EF 25-30% with mildly decreased RV systolic function, moderate MR. She was initially on IV diltiazem, this was transitioned to IV amiodarone when low EF was found.  She was noted to be volume overloaded and has been getting IV Lasix.  LHC/RHC was done today showing normal coronaries, markedly elevated filling pressures with low cardiac output (CI 1.6).  She remains on cefdinir and Flagyl for diverticulitis, still with some abdominal pain.   General: NAD Neck: JVP 14+ cm, no thyromegaly or thyroid nodule.  Lungs: Decreased at bases.  CV: Nondisplaced PMI.  Heart tachy, irregular S1/S2, no S3/S4, 1/6 HSM murmur.  2+ edema to knees.  No carotid bruit.  Normal pedal pulses.  Abdomen: Soft, mild diffuse tenderness, no hepatosplenomegaly, mild distention.  Skin: Intact without lesions or rashes.  Neurologic: Alert and oriented x 3.  Psych: Normal affect. Extremities: No clubbing or cyanosis.  HEENT: Normal.   1. Acute systolic CHF:  Nonischemic cardiomyopathy.  Echo in 2018 with EF 45-50%.  Echo this admission with EF 25-30%, diffuse hypokinesis, mildly decreased RV systolic function, moderate MR. LHC/RHC this admission with no significant coronary disease, markedly elevated filling pressures and low cardiac output (CI 1.6).  She may have a tachycardia-mediated CMP with persistent afib/RVR, or she may  have a cardiomyopathy due to  COVID-19 myocarditis. She is volume overloaded on exam matching RHC findings.  BP is elevated.  - Will carefully try her on milrinone 0.25.  She is on amiodarone gtt for rate control.  - Lasix gtt at 12 mg/hr.   - Entresto 24/26 bid.  - Spironolactone 12.5 daily.  - Start digoxin 0.125 daily.  - She will need TEE-guided DCCV once she is better diuresed and we have weaned down milrinone.  - Eventually, she will need cardiac MRI.  - Will transition from mid-line to PICC to follow CVP and co-ox.  2. Atrial fibrillation with RVR: Persistent atrial fibrillation, it appears, at least since 9/20 when she was admitted with COVID-19. Prior, she had paroxysmal atrial fibrillation and was maintained on sotalol. She is now off sotalol.  - Continue amiodarone 60 mg/hr for rate control.  - Restart Xarelto 20 mg daily.  - TEE-guided DCCV after she is better-diuresed.  - Eventually would benefit from atrial fibrillation ablation ([facility]-HF study).  3. Mitral regurgitation: Functional MR, moderate.  4. Diverticulitis: Afebrile, WBCs not elevated.  - Continue cefdinir and Flagyl per IM.   [name] 06/17/2019

## 2019-06-17 NOTE — Progress Notes (Signed)
Cardiology Progress Note  Patient ID: Melinda Mckinney MRN: [id number] DOB: 23-Apr-1956 Date of Encounter: 06/17/2019  Primary Cardiologist: Skeet Latch, MD  Subjective  Atrial fibrillation with RVR with rates in the 160-170 range.  She reports she is short of breath with minimal activity.  She also is unable to lie flat.  Significant volume overload on exam.  Plan for left heart cath today.  ROS:  All other ROS reviewed and negative. Pertinent positives noted in the HPI.     Inpatient Medications  Scheduled Meds:  allopurinol  100 mg Oral QHS   atorvastatin  40 mg Oral QHS   cefdinir  300 mg Oral Q12H   Chlorhexidine Gluconate Cloth  6 each Topical Daily   DULoxetine  60 mg Oral Daily   furosemide  60 mg Intravenous Once   insulin aspart  0-9 Units Subcutaneous Q4H   levothyroxine  125 mcg Oral QAC breakfast   magic mouthwash  15 mL Oral TID   metoprolol tartrate  50 mg Oral Q6H   metroNIDAZOLE  500 mg Oral Q8H   nystatin   Topical BID   pantoprazole  40 mg Oral BID   raloxifene  60 mg Oral QHS   sodium chloride flush  10-40 mL Intracatheter Q12H   Continuous Infusions:  [START ON 06/18/2019] sodium chloride     sodium chloride 50 mL/hr (06/17/19 1103)   fluconazole (DIFLUCAN) IV 200 mg (06/17/19 1104)   heparin 1,500 Units/hr (06/17/19 1114)   PRN Meds: acetaminophen **OR** acetaminophen, cyclobenzaprine, HYDROmorphone (DILAUDID) injection, ondansetron **OR** ondansetron (ZOFRAN) IV, sodium chloride flush, zolpidem   Vital Signs   Vitals:   06/17/19 0300 06/17/19 0402 06/17/19 0703 06/17/19 1057  BP:  (!) 106/56  (!) 140/91  Pulse:  (!) 174    Resp:  (!) 23  20  Temp: 98.5 F (36.9 C)  98.3 F (36.8 C)   TempSrc: Oral  Oral   SpO2:      Weight:      Height:        Intake/Output Summary (Last 24 hours) at 06/17/2019 1141 Last data filed at 06/17/2019 0800 Gross per 24 hour  Intake 482.75 ml  Output --  Net 482.75 ml   Last 3 Weights  06/17/2019 06/16/2019 06/15/2019  Weight (lbs) (No Data) 242 lb 8.1 oz (No Data)  Weight (kg) (No Data) 110 kg (No Data)      Telemetry  Overnight telemetry shows atrial fibrillation with RVR with rate in the 160-170 range, which I personally reviewed.   Physical Exam   Vitals:   06/17/19 0300 06/17/19 0402 06/17/19 0703 06/17/19 1057  BP:  (!) 106/56  (!) 140/91  Pulse:  (!) 174    Resp:  (!) 23  20  Temp: 98.5 F (36.9 C)  98.3 F (36.8 C)   TempSrc: Oral  Oral   SpO2:      Weight:      Height:         Intake/Output Summary (Last 24 hours) at 06/17/2019 1141 Last data filed at 06/17/2019 0800 Gross per 24 hour  Intake 482.75 ml  Output --  Net 482.75 ml    Last 3 Weights 06/17/2019 06/16/2019 06/15/2019  Weight (lbs) (No Data) 242 lb 8.1 oz (No Data)  Weight (kg) (No Data) 110 kg (No Data)    Body mass index is 40.36 kg/m.  General: Ill-appearing Head: Atraumatic, normal size  Eyes: PEERLA, EOMI  Neck: Supple, no JVD Endocrine: No thryomegaly Cardiac: Irregular  rhythm, no murmurs rubs or gallops Lungs: Clear to auscultation bilaterally, no wheezing, rhonchi or rales  Abd: Soft, nontender, no hepatomegaly  Ext: 1+ edema Musculoskeletal: No deformities, BUE and BLE strength normal and equal Skin: Warm and dry, no rashes   Neuro: Alert and oriented to person, place, time, and situation, CNII-XII grossly intact, no focal deficits  Psych: Normal mood and affect   Labs  High Sensitivity Troponin:  No results for input(s): TROPONINIHS in the last 720 hours.   Cardiac EnzymesNo results for input(s): TROPONINI in the last 168 hours. No results for input(s): TROPIPOC in the last 168 hours.  Chemistry Recent Labs  Lab 06/11/19 1024 06/12/19 0153 06/14/19 0813 06/17/19 0751  NA 138 136 139 138  K 4.0 3.8 3.8 3.8  CL 104 106 110 107  CO2 22 20* 23 21*  GLUCOSE 240* 123* 107* 135*  BUN 17 14 9 10   CREATININE 1.13* 0.84 0.66 0.74  CALCIUM 9.7 8.4* 8.7* 9.2  PROT 6.9   --   --   --   ALBUMIN 4.0  --   --   --   AST 14*  --   --   --   ALT 8  --   --   --   ALKPHOS 88  --   --   --   BILITOT 0.6  --   --   --   GFRNONAA 51* >60 >60 >60  GFRAA 59* >60 >60 >60  ANIONGAP 12 10 6 10     Hematology Recent Labs  Lab 06/14/19 0813 06/16/19 1613 06/17/19 0751  WBC 5.5 5.7 8.4  RBC 3.62* 3.85* 4.14  HGB 10.1* 10.7* 11.5*  HCT 34.8* 35.9* 38.7  MCV 96.1 93.2 93.5  MCH 27.9 27.8 27.8  MCHC 29.0* 29.8* 29.7*  RDW 16.5* 16.3* 16.4*  PLT 248 231 297   BNPNo results for input(s): BNP, PROBNP in the last 168 hours.  DDimer No results for input(s): DDIMER in the last 168 hours.   Radiology  No results found.  Cardiac Studies  TTE 06/16/2019  1. Left ventricular ejection fraction, by visual estimation, is 25 to 30%. The left ventricle has severely decreased function. There is no left ventricular hypertrophy.  2. Left ventricular diastolic parameters are indeterminate.  3. The left ventricle demonstrates global hypokinesis.  4. Global right ventricle has mildly reduced systolic function.The right ventricular size is normal. No increase in right ventricular wall thickness.  5. Left atrial size was moderately dilated.  6. Right atrial size was mildly dilated.  7. The mitral valve is normal in structure. Moderate mitral valve regurgitation. No evidence of mitral stenosis.  8. The tricuspid valve is normal in structure. Tricuspid valve regurgitation moderate.  9. The aortic valve is normal in structure. Aortic valve regurgitation is mild. No evidence of aortic valve sclerosis or stenosis. 10. The pulmonic valve was normal in structure. Pulmonic valve regurgitation is mild. 11. Aortic dilatation noted. 12. There is mild dilatation of the ascending aorta measuring 39 mm. 13. Mildly elevated pulmonary artery systolic pressure. 14. The inferior vena cava is normal in size with greater than 50% respiratory variability, suggesting right atrial pressure of 3  mmHg.  Patient Profile  Melinda Mckinney is a 63 y.o. female with CAD status post PCI in [id number], systolic heart failure, paroxysmal atrial fibrillation who was on sotalol/Xarelto at home, hypertension, prediabetes, recent COVID-19 infection who was admitted 10/27 for acute diverticulitis complicated by atrial fibrillation with RVR and newly diagnosed  systolic heart failure.  Assessment & Plan   1.  Newly diagnosed systolic heart failure, ejection fraction 25 to 30% -Unclear etiology and could be due to critical illness versus atrial fibrillation -N.p.o. for left heart cath today -We will uptitrate her beta-blocker metoprolol tartrate 50 mg every 6 hours for better rate control -I have stopped her sotalol as this is a reverse use dependent agent that only really works at low heart rates -We will give her 60 mg IV Lasix now she appears volume overloaded on exam.  She should be okay for cardiac cath today.  I will not delay that. -I will plan to get her on appropriate ACE/ARB or Arni therapy after her heart catheterization  2.  Atrial fibrillation with RVR -She has a long history of A. fib and is on sotalol for rhythm control as an outpatient -This clearly is doing nothing and she is in the rates of 160-170, which is expected as this is a reverse use dependent agent -I will increase her metoprolol tartrate to 50 mg every 6 hours -I will go ahead and load her with IV amiodarone and we will likely plan for a cardioversion prior to discharge -We will continue her heparin therapy for now and transition back to Xarelto after her cardiac cath -We will continue with above diuresis as this may help with her rates  3.  CAD status post PCI in 2010 -Continue her home aspirin statin  For questions or updates, please contact [facility] Please consult [email or web address] for contact info under   Signed, [place] Bells T. Audie Box, [place]  06/17/2019 11:41 AM

## 2019-06-17 NOTE — TOC Initial Note (Signed)
Transition of Care [facility]) - Initial/Assessment Note    Patient Details  Name: Alaisa Curless MRN: [id number] Date of Birth: 12/26/1955  Transition of Care [facility]) CM/SW Contact:    Trish Mage, LCSW Phone Number: 06/17/2019, 12:52 PM  Clinical Narrative:   Ms Kieper was seen today by CSW due to high risk of readmission, and PT evaluation recommending [facility].  Interviewed patient with help of remote interpretive services.   Ms Wallenhorst lives at home with husband.  She has 2 daughters and 4 grandchildren.  At home, she uses a walker and a BSC, and is currently receiving [facility] through [facility].  She states her medications have all been affordable to this point. She is scheduled for a heart catheterization later today, and is a bit preoccupied with her worries about that.  No other needs identified.  I confirmed with Santiago Glad with [facility] that, in fact, the patient is open to them for PT services. TOC will continue to follow during the course of hospitalization.  Patient will require order for resumption of PT at d/c.                Expected Discharge Plan: [place] Barriers to Discharge: No Barriers Identified   Patient Goals and CMS Choice Patient states their goals for this hospitalization and ongoing recovery are:: "I am going in for a heart procedure today. at 3:00."      Expected Discharge Plan and Services Expected Discharge Plan: [place]   Discharge Planning Services: CM Consult   Living arrangements for the past 2 months: Single Family Home                                      Prior Living Arrangements/Services Living arrangements for the past 2 months: Single Family Home Lives with:: Spouse Patient language and need for interpreter reviewed:: Yes(Spanish) Do you feel safe going back to the place where you live?: Yes      Need for Family Participation in Patient Care: Yes (Comment) Care giver support system in place?: Yes  (comment) Current home services: DME, Home PT Criminal Activity/Legal Involvement Pertinent to Current Situation/Hospitalization: No - Comment as needed  Activities of Daily Living Home Assistive Devices/Equipment: None ADL Screening (condition at time of admission) Patient's cognitive ability adequate to safely complete daily activities?: Yes Is the patient deaf or have difficulty hearing?: No Does the patient have difficulty seeing, even when wearing glasses/contacts?: No Does the patient have difficulty concentrating, remembering, or making decisions?: No Patient able to express need for assistance with ADLs?: Yes Does the patient have difficulty dressing or bathing?: No Independently performs ADLs?: Yes (appropriate for developmental age) Does the patient have difficulty walking or climbing stairs?: No Weakness of Legs: None Weakness of Arms/Hands: None  Permission Sought/Granted                  Emotional Assessment Appearance:: Appears stated age Attitude/Demeanor/Rapport: Engaged Affect (typically observed): Appropriate Orientation: : Oriented to Self, Oriented to Place, Oriented to  Time, Oriented to Situation Alcohol / Substance Use: Not Applicable Psych Involvement: No (comment)  Admission diagnosis:  Diverticulitis [K57.92] Atrial fibrillation with RVR ([place]) [I48.91] Sepsis, due to unspecified organism, unspecified whether acute organ dysfunction present [facility]) [A41.9] History of 2019 novel coronavirus disease (COVID-19) [Z86.19] Patient Active Problem List   Diagnosis Date Noted  . Sepsis ([place])  06/11/2019  . Diverticulitis 06/11/2019  . Pneumonia due to COVID-19 virus 05/25/2019  . Hypokalemia 05/25/2019  . Hypomagnesemia 05/25/2019  . COVID-19 05/01/2019  . 2019 novel coronavirus disease (COVID-19) 04/30/2019  . Encounter for monitoring sotalol therapy 08/03/2017  . Chronic systolic CHF (congestive heart failure) ([place]) 07/27/2017  . Acute congestive heart  failure ([place]) 07/27/2017  . Precordial pain   . Atrial fibrillation with rapid ventricular response ([place]) 06/29/2017  . Hyperglycemia 01/06/2017  . PCP NOTES >>>> 06/16/2015  . Back pain 02/05/2015  . Hypothyroidism 01/19/2015  . Vitamin D deficiency   . Vulvar atrophy 08/20/2014  . Annual physical exam 07/24/2014  . Anemia 07/24/2014  . Insomnia 09/27/2013  . Intertrigo 09/27/2013  . Urolithiasis 09/27/2013  . Essential hypertension   . GERD, h/o achalasia s/p heller ~2009)   . Hyperlipidemia LDL goal <70   . Coronary artery disease involving native coronary artery of native heart with angina pectoris ([place])   . Osteoporosis   . Fibromyalgia    PCP:  Colon Branch, MD Pharmacy:   [facility], [address] [place] [address] Phone: [phone] Fax: [phone]     Social Determinants of Health (SDOH) Interventions    Readmission Risk Interventions No flowsheet data found.

## 2019-06-17 NOTE — Progress Notes (Signed)
Heparin turned off.

## 2019-06-17 NOTE — Progress Notes (Signed)
  Amiodarone Drug - Drug Interaction Consult Note  Recommendations: - monitor for s/s myopathy  Amiodarone is metabolized by the cytochrome P450 system and therefore has the potential to cause many drug interactions. Amiodarone has an average plasma half-life of 50 days (range 20 to 100 days).   There is potential for drug interactions to occur several weeks or months after stopping treatment and the onset of drug interactions may be slow after initiating amiodarone.   [x]  Statins: Increased risk of myopathy. Simvastatin- restrict dose to 20mg  daily. Other statins: counsel patients to report any muscle pain or weakness immediately. - On lipitor  []  Anticoagulants: Amiodarone can increase anticoagulant effect. Consider warfarin dose reduction. Patients should be monitored closely and the dose of anticoagulant altered accordingly, remembering that amiodarone levels take several weeks to stabilize.  []  Antiepileptics: Amiodarone can increase plasma concentration of phenytoin, the dose should be reduced. Note that small changes in phenytoin dose can result in large changes in levels. Monitor patient and counsel on signs of toxicity.  []  Beta blockers: increased risk of bradycardia, AV block and myocardial depression. Sotalol - avoid concomitant use.  []   Calcium channel blockers (diltiazem and verapamil): increased risk of bradycardia, AV block and myocardial depression.  []   Cyclosporine: Amiodarone increases levels of cyclosporine. Reduced dose of cyclosporine is recommended.  []  Digoxin dose should be halved when amiodarone is started.  []  Diuretics: increased risk of cardiotoxicity if hypokalemia occurs.  []  Oral hypoglycemic agents (glyburide, glipizide, glimepiride): increased risk of hypoglycemia. Patient's glucose levels should be monitored closely when initiating amiodarone therapy.   []  Drugs that prolong the QT interval:  Torsades de pointes risk may be increased with concurrent  use - avoid if possible.  Monitor QTc, also keep magnesium/potassium WNL if concurrent therapy can't be avoided. Marland Kitchen Antibiotics: e.g. fluoroquinolones, erythromycin. . Antiarrhythmics: e.g. quinidine, procainamide, disopyramide, sotalol. . Antipsychotics: e.g. phenothiazines, haloperidol.  . Lithium, tricyclic antidepressants, and methadone. Thank You,   Lynelle Doctor  06/17/2019 12:10 PM

## 2019-06-17 NOTE — Progress Notes (Signed)
To lab 9

## 2019-06-17 NOTE — Progress Notes (Signed)
Peripherally Inserted Central Catheter/Midline Placement  The IV Nurse has discussed with the patient and/or persons authorized to consent for the patient, the purpose of this procedure and the potential benefits and risks involved with this procedure.  The benefits include less needle sticks, lab draws from the catheter, and the patient may be discharged home with the catheter. Risks include, but not limited to, infection, bleeding, blood clot (thrombus formation), and puncture of an artery; nerve damage and irregular heartbeat and possibility to perform a PICC exchange if needed/ordered by physician.  Alternatives to this procedure were also discussed.  Bard Power PICC patient education guide, fact sheet on infection prevention and patient information card has been provided to patient /or left at bedside.    PICC/Midline Placement Documentation  PICC Triple Lumen 06/17/19 PICC Right Brachial 41 cm 0 cm (Active)  Indication for Insertion or Continuance of Line Vasoactive infusions 06/17/19 2120  Exposed Catheter (cm) 0 cm 06/17/19 2120  Site Assessment Clean;Dry;Intact 06/17/19 2120  Lumen #1 Status Blood return noted;Flushed;Saline locked 06/17/19 2120  Lumen #2 Status Blood return noted;Flushed;Saline locked 06/17/19 2120  Lumen #3 Status Blood return noted;Flushed;Saline locked 06/17/19 2120  Dressing Type Transparent;Occlusive;Securing device 06/17/19 2120  Dressing Status Clean;Dry;Intact;Antimicrobial disc in place 06/17/19 2120  Line Adjustment (NICU/IV Team Only) No 06/17/19 2120  Dressing Intervention New dressing 06/17/19 2120  Dressing Change Due 06/24/19 06/17/19 2120       Edson Snowball 06/17/2019, 9:40 PM

## 2019-06-17 NOTE — Progress Notes (Signed)
Arrived to [facility] pre cath w/ heparin infusing at 1500U to left upper arm with Amiodarone infusing at 33.3cc/hr piggybacked. Switched Amiodarone to saline lock left forearm. Dr. Gwenlyn Found here

## 2019-06-17 NOTE — Progress Notes (Signed)
PROGRESS NOTE    Melinda Mckinney  XVQ:[id number] DOB: [birth year] DOA: 06/11/2019 PCP: Colon Branch, MD   Brief Narrative: Melinda Mckinney is a 63 y.o. female with a history of atrial fibrillation on Xarelto, CAD s/p stent, hypertension, systolic heart failure. Patient presented secondary to abdominal pain and found to have diverticulitis, failing outpatient treatment. Antibiotics started. Also found to have atrial fibrillation with RVR.   Assessment & Plan:   Principal Problem:   Sepsis ([place]) Active Problems:   Essential hypertension   Coronary artery disease involving native coronary artery of native heart with angina pectoris (HCC)   Atrial fibrillation with rapid ventricular response (HCC)   Chronic systolic CHF (congestive heart failure) ([place])   COVID-19   Diverticulitis   Diverticulitis Failed outpatient treatment with ciprofloxacin/flagyl. Started on Ceftriaxone/Flagyl on admission. Does not appear to have met sepsis criteria on admission. Blood cultures with no growth to date. Symptoms significantly improved. Afebrile. WBC normal -Cefdinir/flagyl  Essential hypertension Soft blood pressures. Also with history of orthostatic symptoms. -Antihypertensives as below  Dizziness Related to orthostatic vitals. PT recommending home health. Some improvement. Has some intermittent dyspnea on exertion likely related to patient's intermittent rate issues. -Orthostatic vitals  Throat pain Appears to have evidence of thrush. -Fluconazole 200 mg IV today since NPO for cath -Magic mouthwash QID  Pre-diabetes No evidence of diabetes on chart review. Highest hemoglobin A1C is 6.1%. Recommend diet modification.  Paroxysmal atrial fibrillation with RVR Patient managed on diltiazem drip initially. Sotalol continued. Rate improved and she was transitioned to home regimen. Rates have worsened again and blood pressure remains soft. -Continue sotalol and diltiazem -Switched to heparin from  Xarelto for heart cath -Cardiology recommendations: Started on metoprolol which has been increased to metoprolol 50 mg q 6 hours in addition to starting amiodarone IV  Chronic systolic heart failure Patient with reduced EF of 25-30% which is new. Previous EF of 45-50% from 2018. Unknown etiology. Recent Covid infection and currently in atrial fibrillation. Cardiology on board -Cardiology recommendations: IV lasix -Daily weights/strict in and outs  Hypothyroidism Recent TSH of 1.6 -Continue Synthroid  Recent COVID-19 pneumonia Symptoms improved. Still with some dyspnea on exertion but much improved from weeks prior.   DVT prophylaxis: Xarelto Code Status:   Code Status: Full Code Family Communication: None at bedside Disposition Plan: Discharge home pending cardiology recommendations/cardioversion   Consultants:   Cardiology  Procedures:   11/1: Transthoracic Echocardiogram IMPRESSIONS    1. Left ventricular ejection fraction, by visual estimation, is 25 to 30%. The left ventricle has severely decreased function. There is no left ventricular hypertrophy.  2. Left ventricular diastolic parameters are indeterminate.  3. The left ventricle demonstrates global hypokinesis.  4. Global right ventricle has mildly reduced systolic function.The right ventricular size is normal. No increase in right ventricular wall thickness.  5. Left atrial size was moderately dilated.  6. Right atrial size was mildly dilated.  7. The mitral valve is normal in structure. Moderate mitral valve regurgitation. No evidence of mitral stenosis.  8. The tricuspid valve is normal in structure. Tricuspid valve regurgitation moderate.  9. The aortic valve is normal in structure. Aortic valve regurgitation is mild. No evidence of aortic valve sclerosis or stenosis. 10. The pulmonic valve was normal in structure. Pulmonic valve regurgitation is mild. 11. Aortic dilatation noted. 12. There is mild dilatation  of the ascending aorta measuring 39 mm. 13. Mildly elevated pulmonary artery systolic pressure. 14. The inferior vena cava is normal in size with  greater than 50% respiratory variability, suggesting right atrial pressure of 3 mmHg.  FINDINGS  Left Ventricle: Left ventricular ejection fraction, by visual estimation, is 25 to 30%. The left ventricle has severely decreased function. The left ventricle demonstrates global hypokinesis. There is no left ventricular hypertrophy. Left ventricular  diastolic parameters are indeterminate. Normal left atrial pressure.  Right Ventricle: The right ventricular size is normal. No increase in right ventricular wall thickness. Global RV systolic function is has mildly reduced systolic function. The tricuspid regurgitant velocity is 2.46 m/s, and with an assumed right atrial  pressure of 8 mmHg, the estimated right ventricular systolic pressure is mildly elevated at 32.2 mmHg.  Left Atrium: Left atrial size was moderately dilated.  Right Atrium: Right atrial size was mildly dilated  Pericardium: There is no evidence of pericardial effusion.  Mitral Valve: The mitral valve is normal in structure. No evidence of mitral valve stenosis by observation. Moderate mitral valve regurgitation.  Tricuspid Valve: The tricuspid valve is normal in structure. Tricuspid valve regurgitation moderate.  Aortic Valve: The aortic valve is normal in structure. Aortic valve regurgitation is mild. Aortic regurgitation PHT measures 835 msec. The aortic valve is structurally normal, with no evidence of sclerosis or stenosis.  Pulmonic Valve: The pulmonic valve was normal in structure. Pulmonic valve regurgitation is mild.  Aorta: Aortic dilatation noted. There is mild dilatation of the ascending aorta measuring 39 mm.  Venous: The inferior vena cava is normal in size with greater than 50% respiratory variability, suggesting right atrial pressure of 3 mmHg.  IAS/Shunts:  No atrial level shunt detected by color flow Doppler. No ventricular septal defect is seen or detected. There is no evidence of an atrial septal defect.  Antimicrobials:  Ceftriaxone (10/27>>  Flagyl (10/27>>    Subjective: Interpreter: Nevin Bloodgood  [phone]  Throat pain today. Dyspnea with exertion.  Objective: Vitals:   06/17/19 0200 06/17/19 0300 06/17/19 0402 06/17/19 0703  BP: 120/90  (!) 106/56   Pulse: 60  (!) 174   Resp: (!) 21  (!) 23   Temp:  98.5 F (36.9 C)  98.3 F (36.8 C)  TempSrc:  Oral  Oral  SpO2:      Weight:      Height:        Intake/Output Summary (Last 24 hours) at 06/17/2019 0816 Last data filed at 06/17/2019 0800 Gross per 24 hour  Intake 682.75 ml  Output -  Net 682.75 ml   Filed Weights   06/16/19 0500  Weight: 110 kg    Examination:  General exam: Appears calm and comfortable HEENT: white patches seen on side of tongue and back of throat Respiratory system: Clear to auscultation. Respiratory effort normal. Cardiovascular system: S1 & S2 heard, Irregular rhythm with fast rate. No murmurs, rubs, gallops or clicks. Gastrointestinal system: Abdomen is obese, soft and nontender. Normal bowel sounds heard. Central nervous system: Alert and oriented. No focal neurological deficits. Extremities: No calf tenderness Skin: No cyanosis. No rashes Psychiatry: Judgement and insight appear normal. Mood & affect appropriate.     Data Reviewed: I have personally reviewed following labs and imaging studies  CBC: Recent Labs  Lab 06/11/19 1024 06/12/19 0153 06/14/19 0813 06/16/19 1613 06/17/19 0751  WBC 9.0 7.4 5.5 5.7 8.4  NEUTROABS 6.8  --   --   --   --   HGB 11.7* 10.8* 10.1* 10.7* 11.5*  HCT 39.1 37.2 34.8* 35.9* 38.7  MCV 92.9 96.4 96.1 93.2 93.5  PLT 363 300 248  231 831   Basic Metabolic Panel: Recent Labs  Lab 06/11/19 1024 06/12/19 0153 06/14/19 0813  NA 138 136 139  K 4.0 3.8 3.8  CL 104 106 110  CO2 22 20* 23  GLUCOSE 240*  123* 107*  BUN _0 CREATININE 1.13* 0.84 0.66  CALCIUM 9.7 8.4* 8.7*   GFR: Estimated Creatinine Clearance: 86.5 mL/min (by C-G formula based on SCr of 0.66 mg/dL). Liver Function Tests: Recent Labs  Lab 06/11/19 1024  AST 14*  ALT 8  ALKPHOS 88  BILITOT 0.6  PROT 6.9  ALBUMIN 4.0   Recent Labs  Lab 06/11/19 1024  LIPASE 25   No results for input(s): AMMONIA in the last 168 hours. Coagulation Profile: Recent Labs  Lab 06/11/19 1024  INR 1.6*   Cardiac Enzymes: No results for input(s): CKTOTAL, CKMB, CKMBINDEX, TROPONINI in the last 168 hours. BNP (last 3 results) No results for input(s): PROBNP in the last 8760 hours. HbA1C: No results for input(s): HGBA1C in the last 72 hours. CBG: Recent Labs  Lab 06/16/19 0730 06/16/19 1551 06/16/19 1954 06/16/19 2306 06/17/19 0409  GLUCAP 113* 144* 156* 112* 128*   Lipid Profile: No results for input(s): CHOL, HDL, LDLCALC, TRIG, CHOLHDL, LDLDIRECT in the last 72 hours. Thyroid Function Tests: No results for input(s): TSH, T4TOTAL, FREET4, T3FREE, THYROIDAB in the last 72 hours. Anemia Panel: No results for input(s): VITAMINB12, FOLATE, FERRITIN, TIBC, IRON, RETICCTPCT in the last 72 hours. Sepsis Labs: Recent Labs  Lab 06/11/19 1205 06/11/19 1526  LATICACIDVEN 2.9* 1.5    Recent Results (from the past 240 hour(s))  Culture, blood (Routine X 2) w Reflex to ID Panel     Status: None   Collection Time: 06/11/19 12:05 PM   Specimen: BLOOD  Result Value Ref Range Status   Specimen Description   Final    BLOOD RIGHT ANTECUBITAL Performed at [facility], [address] [place], [place] [address]    Special Requests   Final    BOTTLES DRAWN AEROBIC AND ANAEROBIC Blood Culture results may not be optimal due to an inadequate volume of blood received in culture bottles Performed at [facility], [address] [place], [place] [address]    Culture   Final    NO GROWTH 5 DAYS Performed  at [facility], [place] [address] [place], [place] [address]    Report Status 06/16/2019 FINAL  Final  Culture, blood (Routine X 2) w Reflex to ID Panel     Status: None   Collection Time: 06/11/19 12:55 PM   Specimen: BLOOD  Result Value Ref Range Status   Specimen Description   Final    BLOOD BLOOD RIGHT FOREARM Performed at [facility], [address] [place], [place] [address]    Special Requests   Final    BOTTLES DRAWN AEROBIC AND ANAEROBIC Blood Culture results may not be optimal due to an inadequate volume of blood received in culture bottles Performed at [facility], [address] [place], [place] [address]    Culture   Final    NO GROWTH 5 DAYS Performed at [facility], [place] [address] [place], [place] [address]    Report Status 06/16/2019 FINAL  Final  Urine culture     Status: Abnormal   Collection Time: 06/11/19  3:26 PM   Specimen: In/Out Cath Urine  Result Value Ref Range Status  Specimen Description   Final    IN/OUT CATH URINE Performed at [facility], [address] [place], [place] [address]    Special Requests   Final    NONE Performed at [facility], [address] [place], [place] [address]    Culture 10,000 COLONIES/mL YEAST (A)  Final   Report Status 06/13/2019 FINAL  Final  MRSA PCR Screening     Status: None   Collection Time: 06/11/19  9:52 PM   Specimen: Nasal Mucosa; Nasopharyngeal  Result Value Ref Range Status   MRSA by PCR NEGATIVE NEGATIVE Final    Comment:        The GeneXpert MRSA Assay (FDA approved for NASAL specimens only), is one component of a comprehensive MRSA colonization surveillance program. It is not intended to diagnose MRSA infection nor to guide or monitor treatment for MRSA infections. Performed at [facility], [place] [address] [place], [place] [address]          Radiology Studies: No results found.      Scheduled Meds:  . allopurinol  100 mg Oral QHS  . atorvastatin  40 mg Oral QHS  . cefdinir  300 mg Oral Q12H  . Chlorhexidine Gluconate Cloth  6 each Topical Daily  . DULoxetine  60 mg Oral Daily  . insulin aspart  0-9 Units Subcutaneous Q4H  . levothyroxine  125 mcg Oral QAC breakfast  . metoprolol tartrate  25 mg Oral BID  . metroNIDAZOLE  500 mg Oral Q8H  . nystatin   Topical BID  . pantoprazole  40 mg Oral BID  . raloxifene  60 mg Oral QHS  . sotalol  120 mg Oral Q12H   Continuous Infusions: . heparin 1,500 Units/hr (06/17/19 0153)     LOS: 6 days     Cordelia Poche, MD [facility] 06/17/2019, 8:16 AM  If 7PM-7AM, please contact night-coverage [email or web address]

## 2019-06-17 NOTE — H&P (View-Only) (Signed)
Cardiology Progress Note  Patient ID: Melinda Mckinney MRN: [id number] DOB: Jan 28, 1956 Date of Encounter: 06/17/2019  Primary Cardiologist: Skeet Latch, MD  Subjective  Atrial fibrillation with RVR with rates in the 160-170 range.  She reports she is short of breath with minimal activity.  She also is unable to lie flat.  Significant volume overload on exam.  Plan for left heart cath today.  ROS:  All other ROS reviewed and negative. Pertinent positives noted in the HPI.     Inpatient Medications  Scheduled Meds:  allopurinol  100 mg Oral QHS   atorvastatin  40 mg Oral QHS   cefdinir  300 mg Oral Q12H   Chlorhexidine Gluconate Cloth  6 each Topical Daily   DULoxetine  60 mg Oral Daily   furosemide  60 mg Intravenous Once   insulin aspart  0-9 Units Subcutaneous Q4H   levothyroxine  125 mcg Oral QAC breakfast   magic mouthwash  15 mL Oral TID   metoprolol tartrate  50 mg Oral Q6H   metroNIDAZOLE  500 mg Oral Q8H   nystatin   Topical BID   pantoprazole  40 mg Oral BID   raloxifene  60 mg Oral QHS   sodium chloride flush  10-40 mL Intracatheter Q12H   Continuous Infusions:  [START ON 06/18/2019] sodium chloride     sodium chloride 50 mL/hr (06/17/19 1103)   fluconazole (DIFLUCAN) IV 200 mg (06/17/19 1104)   heparin 1,500 Units/hr (06/17/19 1114)   PRN Meds: acetaminophen **OR** acetaminophen, cyclobenzaprine, HYDROmorphone (DILAUDID) injection, ondansetron **OR** ondansetron (ZOFRAN) IV, sodium chloride flush, zolpidem   Vital Signs   Vitals:   06/17/19 0300 06/17/19 0402 06/17/19 0703 06/17/19 1057  BP:  (!) 106/56  (!) 140/91  Pulse:  (!) 174    Resp:  (!) 23  20  Temp: 98.5 F (36.9 C)  98.3 F (36.8 C)   TempSrc: Oral  Oral   SpO2:      Weight:      Height:        Intake/Output Summary (Last 24 hours) at 06/17/2019 1141 Last data filed at 06/17/2019 0800 Gross per 24 hour  Intake 482.75 ml  Output --  Net 482.75 ml   Last 3 Weights  06/17/2019 06/16/2019 06/15/2019  Weight (lbs) (No Data) 242 lb 8.1 oz (No Data)  Weight (kg) (No Data) 110 kg (No Data)      Telemetry  Overnight telemetry shows atrial fibrillation with RVR with rate in the 160-170 range, which I personally reviewed.   Physical Exam   Vitals:   06/17/19 0300 06/17/19 0402 06/17/19 0703 06/17/19 1057  BP:  (!) 106/56  (!) 140/91  Pulse:  (!) 174    Resp:  (!) 23  20  Temp: 98.5 F (36.9 C)  98.3 F (36.8 C)   TempSrc: Oral  Oral   SpO2:      Weight:      Height:         Intake/Output Summary (Last 24 hours) at 06/17/2019 1141 Last data filed at 06/17/2019 0800 Gross per 24 hour  Intake 482.75 ml  Output --  Net 482.75 ml    Last 3 Weights 06/17/2019 06/16/2019 06/15/2019  Weight (lbs) (No Data) 242 lb 8.1 oz (No Data)  Weight (kg) (No Data) 110 kg (No Data)    Body mass index is 40.36 kg/m.  General: Ill-appearing Head: Atraumatic, normal size  Eyes: PEERLA, EOMI  Neck: Supple, no JVD Endocrine: No thryomegaly Cardiac: Irregular  rhythm, no murmurs rubs or gallops Lungs: Clear to auscultation bilaterally, no wheezing, rhonchi or rales  Abd: Soft, nontender, no hepatomegaly  Ext: 1+ edema Musculoskeletal: No deformities, BUE and BLE strength normal and equal Skin: Warm and dry, no rashes   Neuro: Alert and oriented to person, place, time, and situation, CNII-XII grossly intact, no focal deficits  Psych: Normal mood and affect   Labs  High Sensitivity Troponin:  No results for input(s): TROPONINIHS in the last 720 hours.   Cardiac EnzymesNo results for input(s): TROPONINI in the last 168 hours. No results for input(s): TROPIPOC in the last 168 hours.  Chemistry Recent Labs  Lab 06/11/19 1024 06/12/19 0153 06/14/19 0813 06/17/19 0751  NA 138 136 139 138  K 4.0 3.8 3.8 3.8  CL 104 106 110 107  CO2 22 20* 23 21*  GLUCOSE 240* 123* 107* 135*  BUN 17 14 9 10   CREATININE 1.13* 0.84 0.66 0.74  CALCIUM 9.7 8.4* 8.7* 9.2  PROT 6.9   --   --   --   ALBUMIN 4.0  --   --   --   AST 14*  --   --   --   ALT 8  --   --   --   ALKPHOS 88  --   --   --   BILITOT 0.6  --   --   --   GFRNONAA 51* >60 >60 >60  GFRAA 59* >60 >60 >60  ANIONGAP 12 10 6 10     Hematology Recent Labs  Lab 06/14/19 0813 06/16/19 1613 06/17/19 0751  WBC 5.5 5.7 8.4  RBC 3.62* 3.85* 4.14  HGB 10.1* 10.7* 11.5*  HCT 34.8* 35.9* 38.7  MCV 96.1 93.2 93.5  MCH 27.9 27.8 27.8  MCHC 29.0* 29.8* 29.7*  RDW 16.5* 16.3* 16.4*  PLT 248 231 297   BNPNo results for input(s): BNP, PROBNP in the last 168 hours.  DDimer No results for input(s): DDIMER in the last 168 hours.   Radiology  No results found.  Cardiac Studies  TTE 06/16/2019  1. Left ventricular ejection fraction, by visual estimation, is 25 to 30%. The left ventricle has severely decreased function. There is no left ventricular hypertrophy.  2. Left ventricular diastolic parameters are indeterminate.  3. The left ventricle demonstrates global hypokinesis.  4. Global right ventricle has mildly reduced systolic function.The right ventricular size is normal. No increase in right ventricular wall thickness.  5. Left atrial size was moderately dilated.  6. Right atrial size was mildly dilated.  7. The mitral valve is normal in structure. Moderate mitral valve regurgitation. No evidence of mitral stenosis.  8. The tricuspid valve is normal in structure. Tricuspid valve regurgitation moderate.  9. The aortic valve is normal in structure. Aortic valve regurgitation is mild. No evidence of aortic valve sclerosis or stenosis. 10. The pulmonic valve was normal in structure. Pulmonic valve regurgitation is mild. 11. Aortic dilatation noted. 12. There is mild dilatation of the ascending aorta measuring 39 mm. 13. Mildly elevated pulmonary artery systolic pressure. 14. The inferior vena cava is normal in size with greater than 50% respiratory variability, suggesting right atrial pressure of 3  mmHg.  Patient Profile  Melinda Mckinney is a 63 y.o. female with CAD status post PCI in [id number], systolic heart failure, paroxysmal atrial fibrillation who was on sotalol/Xarelto at home, hypertension, prediabetes, recent COVID-19 infection who was admitted 10/27 for acute diverticulitis complicated by atrial fibrillation with RVR and newly diagnosed  systolic heart failure.  Assessment & Plan   1.  Newly diagnosed systolic heart failure, ejection fraction 25 to 30% -Unclear etiology and could be due to critical illness versus atrial fibrillation -N.p.o. for left heart cath today -We will uptitrate her beta-blocker metoprolol tartrate 50 mg every 6 hours for better rate control -I have stopped her sotalol as this is a reverse use dependent agent that only really works at low heart rates -We will give her 60 mg IV Lasix now she appears volume overloaded on exam.  She should be okay for cardiac cath today.  I will not delay that. -I will plan to get her on appropriate ACE/ARB or Arni therapy after her heart catheterization  2.  Atrial fibrillation with RVR -She has a long history of A. fib and is on sotalol for rhythm control as an outpatient -This clearly is doing nothing and she is in the rates of 160-170, which is expected as this is a reverse use dependent agent -I will increase her metoprolol tartrate to 50 mg every 6 hours -I will go ahead and load her with IV amiodarone and we will likely plan for a cardioversion prior to discharge -We will continue her heparin therapy for now and transition back to Xarelto after her cardiac cath -We will continue with above diuresis as this may help with her rates  3.  CAD status post PCI in 2010 -Continue her home aspirin statin  For questions or updates, please contact [place] Please consult [email or web address] for contact info under   Signed, [place] T. Audie Box, [place]  06/17/2019 11:41 AM

## 2019-06-18 ENCOUNTER — Encounter (HOSPITAL_COMMUNITY): Payer: Self-pay | Admitting: Cardiovascular Disease

## 2019-06-18 DIAGNOSIS — I5043 Acute on chronic combined systolic (congestive) and diastolic (congestive) heart failure: Secondary | ICD-10-CM

## 2019-06-18 LAB — GLUCOSE, CAPILLARY
Glucose-Capillary: 138 mg/dL — ABNORMAL HIGH (ref 70–99)
Glucose-Capillary: 149 mg/dL — ABNORMAL HIGH (ref 70–99)
Glucose-Capillary: 152 mg/dL — ABNORMAL HIGH (ref 70–99)
Glucose-Capillary: 153 mg/dL — ABNORMAL HIGH (ref 70–99)
Glucose-Capillary: 160 mg/dL — ABNORMAL HIGH (ref 70–99)
Glucose-Capillary: 163 mg/dL — ABNORMAL HIGH (ref 70–99)

## 2019-06-18 LAB — POCT I-STAT EG7
Acid-Base Excess: 5 mmol/L — ABNORMAL HIGH (ref 0.0–2.0)
Acid-base deficit: 3 mmol/L — ABNORMAL HIGH (ref 0.0–2.0)
Bicarbonate: 23.5 mmol/L (ref 20.0–28.0)
Bicarbonate: 30.2 mmol/L — ABNORMAL HIGH (ref 20.0–28.0)
Calcium, Ion: 1.24 mmol/L (ref 1.15–1.40)
Calcium, Ion: 1.03 mmol/L — ABNORMAL LOW (ref 1.15–1.40)
HCT: 36 % (ref 36.0–46.0)
HCT: 36 % (ref 36.0–46.0)
Hemoglobin: 12.2 g/dL (ref 12.0–15.0)
Hemoglobin: 12.2 g/dL (ref 12.0–15.0)
O2 Saturation: 47 %
O2 Saturation: 64 %
Patient temperature: 98.4
Potassium: 3.5 mmol/L (ref 3.5–5.1)
Potassium: 3.5 mmol/L (ref 3.5–5.1)
Sodium: 141 mmol/L (ref 135–145)
Sodium: 133 mmol/L — ABNORMAL LOW (ref 135–145)
TCO2: 25 mmol/L (ref 22–32)
TCO2: 32 mmol/L (ref 22–32)
pCO2, Ven: 44.5 mmHg (ref 44.0–60.0)
pCO2, Ven: 44.3 mmHg (ref 44.0–60.0)
pH, Ven: 7.331 (ref 7.250–7.430)
pH, Ven: 7.442 — ABNORMAL HIGH (ref 7.250–7.430)
pO2, Ven: 28 mmHg — CL (ref 32.0–45.0)
pO2, Ven: 32 mmHg (ref 32.0–45.0)

## 2019-06-18 LAB — CBC
HCT: 34.8 % — ABNORMAL LOW (ref 36.0–46.0)
Hemoglobin: 10.5 g/dL — ABNORMAL LOW (ref 12.0–15.0)
MCH: 27.5 pg (ref 26.0–34.0)
MCHC: 30.2 g/dL (ref 30.0–36.0)
MCV: 91.1 fL (ref 80.0–100.0)
Platelets: 284 10*3/uL (ref 150–400)
RBC: 3.82 MIL/uL — ABNORMAL LOW (ref 3.87–5.11)
RDW: 16.1 % — ABNORMAL HIGH (ref 11.5–15.5)
WBC: 7.2 10*3/uL (ref 4.0–10.5)
nRBC: 0 % (ref 0.0–0.2)

## 2019-06-18 LAB — BASIC METABOLIC PANEL
Anion gap: 14 (ref 5–15)
Anion gap: 17 — ABNORMAL HIGH (ref 5–15)
Anion gap: 11 (ref 5–15)
BUN: 6 mg/dL — ABNORMAL LOW (ref 8–23)
BUN: 6 mg/dL — ABNORMAL LOW (ref 8–23)
BUN: 5 mg/dL — ABNORMAL LOW (ref 8–23)
CO2: 24 mmol/L (ref 22–32)
CO2: 27 mmol/L (ref 22–32)
CO2: 29 mmol/L (ref 22–32)
Calcium: 8.2 mg/dL — ABNORMAL LOW (ref 8.9–10.3)
Calcium: 8.5 mg/dL — ABNORMAL LOW (ref 8.9–10.3)
Calcium: 8.2 mg/dL — ABNORMAL LOW (ref 8.9–10.3)
Chloride: 96 mmol/L — ABNORMAL LOW (ref 98–111)
Chloride: 91 mmol/L — ABNORMAL LOW (ref 98–111)
Chloride: 94 mmol/L — ABNORMAL LOW (ref 98–111)
Creatinine, Ser: 0.76 mg/dL (ref 0.44–1.00)
Creatinine, Ser: 0.92 mg/dL (ref 0.44–1.00)
Creatinine, Ser: 0.81 mg/dL (ref 0.44–1.00)
GFR calc Af Amer: >60 mL/min (ref >60)
GFR calc Af Amer: >60 mL/min (ref >60)
GFR calc Af Amer: >60 mL/min (ref >60)
GFR calc non Af Amer: >60 mL/min (ref >60)
GFR calc non Af Amer: >60 mL/min (ref >60)
GFR calc non Af Amer: >60 mL/min (ref >60)
Glucose, Bld: 401 mg/dL — ABNORMAL HIGH (ref 70–99)
Glucose, Bld: 337 mg/dL — ABNORMAL HIGH (ref 70–99)
Glucose, Bld: 270 mg/dL — ABNORMAL HIGH (ref 70–99)
Potassium: 2.7 mmol/L — CL (ref 3.5–5.1)
Potassium: 3.4 mmol/L — ABNORMAL LOW (ref 3.5–5.1)
Potassium: 3.4 mmol/L — ABNORMAL LOW (ref 3.5–5.1)
Sodium: 134 mmol/L — ABNORMAL LOW (ref 135–145)
Sodium: 135 mmol/L (ref 135–145)
Sodium: 134 mmol/L — ABNORMAL LOW (ref 135–145)

## 2019-06-18 LAB — C-REACTIVE PROTEIN: CRP: <0.8 mg/dL (ref <1.0)

## 2019-06-18 LAB — COOXEMETRY PANEL
Carboxyhemoglobin: 1.5 % (ref 0.5–1.5)
Carboxyhemoglobin: 1.5 % (ref 0.5–1.5)
Methemoglobin: 1.1 % (ref 0.0–1.5)
Methemoglobin: 1.1 % (ref 0.0–1.5)
O2 Saturation: 60.1 %
O2 Saturation: 54.2 %
Total hemoglobin: 14.3 g/dL (ref 12.0–16.0)
Total hemoglobin: 10.4 g/dL — ABNORMAL LOW (ref 12.0–16.0)

## 2019-06-18 LAB — POCT I-STAT 7, (LYTES, BLD GAS, ICA,H+H)
Acid-base deficit: 3 mmol/L — ABNORMAL HIGH (ref 0.0–2.0)
Bicarbonate: 21.8 mmol/L (ref 20.0–28.0)
Calcium, Ion: 1.22 mmol/L (ref 1.15–1.40)
HCT: 35 % — ABNORMAL LOW (ref 36.0–46.0)
Hemoglobin: 11.9 g/dL — ABNORMAL LOW (ref 12.0–15.0)
O2 Saturation: 99 %
Potassium: 3.5 mmol/L (ref 3.5–5.1)
Sodium: 141 mmol/L (ref 135–145)
TCO2: 23 mmol/L (ref 22–32)
pCO2 arterial: 38 mmHg (ref 32.0–48.0)
pH, Arterial: 7.366 (ref 7.350–7.450)
pO2, Arterial: 151 mmHg — ABNORMAL HIGH (ref 83.0–108.0)

## 2019-06-18 LAB — MAGNESIUM
Magnesium: 1.2 mg/dL — ABNORMAL LOW (ref 1.7–2.4)
Magnesium: 2.2 mg/dL (ref 1.7–2.4)

## 2019-06-18 LAB — APTT: aPTT: 30 seconds (ref 24–36)

## 2019-06-18 LAB — SEDIMENTATION RATE: Sed Rate: 38 mm/hr — ABNORMAL HIGH (ref 0–22)

## 2019-06-18 LAB — T4, FREE: Free T4: 1.89 ng/dL — ABNORMAL HIGH (ref 0.61–1.12)

## 2019-06-18 LAB — TSH: TSH: 8.342 u[IU]/mL — ABNORMAL HIGH (ref 0.350–4.500)

## 2019-06-18 MED ORDER — POTASSIUM CHLORIDE 10 MEQ/50ML IV SOLN
10.0000 meq | INTRAVENOUS | Status: AC
  Administered 2019-06-18 (×4): 10 meq via INTRAVENOUS
  Filled 2019-06-18 (×4): qty 50

## 2019-06-18 MED ORDER — METOPROLOL TARTRATE 25 MG PO TABS
25.0000 mg | ORAL_TABLET | Freq: Once | ORAL | Status: AC
  Administered 2019-06-18: 25 mg via ORAL
  Filled 2019-06-18: qty 1

## 2019-06-18 MED ORDER — LOSARTAN POTASSIUM 25 MG PO TABS: 12.5000 mg | ORAL_TABLET | Freq: Every day | ORAL | Status: DC

## 2019-06-18 MED ORDER — AMIODARONE LOAD VIA INFUSION
150.0000 mg | Freq: Once | INTRAVENOUS | Status: AC
  Administered 2019-06-18: 150 mg via INTRAVENOUS
  Filled 2019-06-18: qty 83.34

## 2019-06-18 MED ORDER — LOSARTAN POTASSIUM 25 MG PO TABS
12.5000 mg | ORAL_TABLET | Freq: Two times a day (BID) | ORAL | Status: DC
  Administered 2019-06-18: 12.5 mg via ORAL
  Filled 2019-06-18: qty 1

## 2019-06-18 MED ORDER — POTASSIUM CHLORIDE CRYS ER 20 MEQ PO TBCR
40.0000 meq | EXTENDED_RELEASE_TABLET | Freq: Two times a day (BID) | ORAL | Status: AC
  Administered 2019-06-18 (×2): 40 meq via ORAL
  Filled 2019-06-18 (×2): qty 2

## 2019-06-18 MED ORDER — MAGNESIUM SULFATE 4 GM/100ML IV SOLN
4.0000 g | Freq: Once | INTRAVENOUS | Status: AC
  Administered 2019-06-18: 4 g via INTRAVENOUS
  Filled 2019-06-18: qty 100

## 2019-06-18 MED ORDER — AMIODARONE IV BOLUS ONLY 150 MG/100ML: 150.0000 mg | Freq: Once | INTRAVENOUS | Status: DC

## 2019-06-18 MED FILL — Nitroglycerin IV Soln 200 MCG/ML in D5W: INTRAVENOUS | Qty: 1 | Status: AC

## 2019-06-18 MED FILL — Nitroglycerin IV Soln 100 MCG/ML in D5W: INTRA_ARTERIAL | Qty: 10 | Status: AC

## 2019-06-18 MED FILL — Verapamil HCl IV Soln 2.5 MG/ML: INTRAVENOUS | Qty: 2 | Status: AC

## 2019-06-18 NOTE — Progress Notes (Signed)
Dr. Aundra Dubin stated this AM that MAP goal is [id number] and systolic goal [id number].

## 2019-06-18 NOTE — Progress Notes (Signed)
Cardiology informed of persistent fast heart rates averaging 130s to 140s at rest. Patient denies any symptoms. Bp at 88/72. 06/18/19 10:13 PM

## 2019-06-18 NOTE — Progress Notes (Signed)
CRITICAL VALUE ALERT  Critical Value:  k-2.7   Date & Time Notied:  06/18/19 3:38 AM  Provider Notified: Dr. Paticia Stack  Orders Received/Actions taken: [facility] riders

## 2019-06-18 NOTE — Progress Notes (Signed)
Labs done as ordered by the cardiologist, results relayed, Heart rate is still consistent at 140s to 150s. Cardiologist paged multiple times, awaiting for further orders. 06/18/19 11:15 PM

## 2019-06-18 NOTE — Progress Notes (Addendum)
Advanced Heart Failure Rounding Note  PCP-Cardiologist: Skeet Latch, MD   Subjective:    Echo 11/1 w/ severely reduced EF, 25-30%. LHC 11/2 w/ normal cors. RHC w/ high filling pressures and low CO.   Started on milrinone 11/2 for low CO. CI 1.6 on RHC. Co-ox 60% on milrinone 0.25 this morning.   Breathing better today. Excellent diuretic response to lasix gtt. 5L out yesterday. Wt trending down from 242>>236 lb. CVP remains elevated at 13. SCr stable 0.76. K low at 2.7. BP soft in the low 469F systolic. Remains in afib w/ RVR in the 110s-130s. On IV amio 60 mg/hr.   IM managing diverticulitis.    Objective:   Weight Range: 107.4 kg Body mass index is 39.4 kg/m.   Vital Signs:   Temp:  [97.9 F (36.6 C)-98.8 F (37.1 C)] 98.2 F (36.8 C) (11/03 0400) Pulse Rate:  [25-147] 57 (11/03 0700) Resp:  [15-31] 18 (11/03 0700) BP: (83-170)/(50-121) 103/54 (11/03 0700) SpO2:  [0 %-100 %] 97 % (11/03 0700) Weight:  [107.4 kg] 107.4 kg (11/03 0500) Last BM Date: 06/17/19  Weight change: Filed Weights   06/16/19 0500 06/18/19 0500  Weight: 110 kg 107.4 kg    Intake/Output:   Intake/Output Summary (Last 24 hours) at 06/18/2019 0733 Last data filed at 06/18/2019 0700 Gross per 24 hour  Intake 1203.92 ml  Output 5101 ml  Net -3897.08 ml      Physical Exam    PHYSICAL EXAM: General:  Well appearing, obese middle aged female. No respiratory difficulty HEENT: normal Neck: supple. Elevated JVD. Carotids 2+ bilat; no bruits. No lymphadenopathy or thyromegaly appreciated. Cor: PMI nondisplaced. irregularly irregular rhythm, tachy rate. No rubs, gallops or murmurs. Lungs: clear Abdomen: soft, slight diffuse tendeness, nondistended. No hepatosplenomegaly. No bruits or masses. Good bowel sounds. Extremities: no cyanosis, clubbing, rash, edema Neuro: alert & oriented x 3, cranial nerves grossly intact. moves all 4 extremities w/o difficulty. Affect pleasant.    Telemetry    afib w/ RVR, fluctuating in the 110s-130s. 3 beat run of NSVT on tele  EKG    N/A  Labs    CBC Recent Labs    06/17/19 0751 06/17/19 1532 06/18/19 0223  WBC 8.4  --  7.2  HGB 11.5* 11.9* 10.5*  HCT 38.7 35.0* 34.8*  MCV 93.5  --  91.1  PLT 297  --  072   Basic Metabolic Panel Recent Labs    06/17/19 0751 06/17/19 1532 06/18/19 0223  NA 138 142 134*  K 3.8 3.4* 2.7*  CL 107  --  96*  CO2 21*  --  24  GLUCOSE 135*  --  401*  BUN 10  --  6*  CREATININE 0.74  --  0.76  CALCIUM 9.2  --  8.2*   Liver Function Tests No results for input(s): AST, ALT, ALKPHOS, BILITOT, PROT, ALBUMIN in the last 72 hours. No results for input(s): LIPASE, AMYLASE in the last 72 hours. Cardiac Enzymes No results for input(s): CKTOTAL, CKMB, CKMBINDEX, TROPONINI in the last 72 hours.  BNP: BNP (last 3 results) Recent Labs    05/26/19 0720 05/27/19 0245 05/28/19 0630  BNP 604.0* 330.7* 345.9*    ProBNP (last 3 results) No results for input(s): PROBNP in the last 8760 hours.   D-Dimer No results for input(s): DDIMER in the last 72 hours. Hemoglobin A1C No results for input(s): HGBA1C in the last 72 hours. Fasting Lipid Panel No results for input(s): CHOL, HDL, LDLCALC,  TRIG, CHOLHDL, LDLDIRECT in the last 72 hours. Thyroid Function Tests Recent Labs    06/18/19 0258  TSH 8.342*    Other results:   Imaging    Dg Chest Port 1 View  Result Date: 06/17/2019 CLINICAL DATA:  PICC line placement EXAM: PORTABLE CHEST 1 VIEW COMPARISON:  06/11/2019 FINDINGS: Right PICC line tip is in the SVC. Cardiomegaly. Vascular congestion. Stable chronic interstitial prominence throughout the lungs. No effusions or acute bony abnormality. IMPRESSION: Cardiomegaly with vascular congestion. Stable chronic interstitial prominence. Electronically Signed   By: Rolm Baptise M.D.   On: 06/17/2019 21:50   [place] Ekg Site Rite  Result Date: 06/17/2019 If Site Rite image not attached, placement  could not be confirmed due to current cardiac rhythm.  [place] Ekg Site Rite  Result Date: 06/17/2019 If Site Rite image not attached, placement could not be confirmed due to current cardiac rhythm.     Medications:     Scheduled Medications: . allopurinol  100 mg Oral QHS  . atorvastatin  40 mg Oral QHS  . cefdinir  300 mg Oral Q12H  . Chlorhexidine Gluconate Cloth  6 each Topical Daily  . digoxin  0.125 mg Oral Daily  . DULoxetine  60 mg Oral Daily  . Gerhardt's butt cream   Topical QID  . insulin aspart  0-9 Units Subcutaneous Q4H  . levothyroxine  125 mcg Oral QAC breakfast  . magic mouthwash  15 mL Oral QID  . metroNIDAZOLE  500 mg Oral Q8H  . nystatin   Topical BID  . pantoprazole  40 mg Oral BID  . raloxifene  60 mg Oral QHS  . rivaroxaban  20 mg Oral Daily  . sacubitril-valsartan  1 tablet Oral BID  . sodium chloride flush  10-40 mL Intracatheter Q12H  . sodium chloride flush  10-40 mL Intracatheter Q12H  . sodium chloride flush  3 mL Intravenous Q12H  . spironolactone  12.5 mg Oral Daily     Infusions: . sodium chloride Stopped (06/18/19 0610)  . amiodarone 30 mg/hr (06/18/19 0700)  . furosemide (LASIX) infusion 12 mg/hr (06/18/19 0700)  . milrinone 0.25 mcg/kg/min (06/18/19 0700)  . potassium chloride 10 mEq (06/18/19 0708)     PRN Medications:  sodium chloride, acetaminophen, cyclobenzaprine, HYDROmorphone (DILAUDID) injection, morphine injection, ondansetron (ZOFRAN) IV, ondansetron **OR** [DISCONTINUED] ondansetron (ZOFRAN) IV, sodium chloride flush, sodium chloride flush, sodium chloride flush, zolpidem    Patient Profile   63 y/o female w/ h/o CAD s/p PCI in 2010, chronic combined systolic and diastolic HF (EF 28-00% in 2018), atrial fibrillation on chronic a/c w/ Xarelto, HTN, prediabetes, hypothyroidism and diagnosed w/ COVID 19 in Sep 2020 and was treated at [place] infection c/b PNA and gastroenteritis. Admitted to [facility] 10/27 for  diverticulitis and afib w/ RVR and found to have worsening systolic function w/ EF now at 25-30%. Subsequent LHC showed widely patent coronaries. RHC c/w severely elevated filling pressures and low CO.   Assessment/Plan   1. Acute systolic CHF:  Nonischemic cardiomyopathy.  Echo in 2018 with EF 45-50%.  Echo this admission with EF 25-30%, diffuse hypokinesis, mildly decreased RV systolic function, moderate MR. LHC/RHC this admission with no significant coronary disease, markedly elevated filling pressures and low cardiac output (CI 1.6).  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 ESR elevated at 38. SCr 1.0 (down from 2.3 3 wks ago). TSH elevated at 8.34. She is  volume overloaded on exam matching RHC findings.  - has PICC. Initial Co-ox 60%.  - Now on milrinone 0.25.  She is on amiodarone gtt for rate control.  - Lasix gtt at 12 mg/hr w/ excellent UOP. -5L out yesterday. Wt down from 242>>236 lb. SCr stable, 0.76.  - Continue Entresto 24/26 bid. Monitor BP closely.  - Continue Spironolactone 12.5 daily.  - Continue digoxin 0.125 daily.  - She will need TEE-guided DCCV once she is better diuresed and we have weaned down milrinone.  - Eventually, she will need cardiac MRI. ? 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 ESR elevated at 38. SCr 1.0 (down from 2.3 3 wks ago). -Needs f/u TFTs. TSH elevated at 8.34. Will check Free T3/T4.  2. Atrial fibrillation with RVR: Persistent atrial fibrillation, it appears, at least since 9/20 when she was admitted with COVID-19. Prior, she had paroxysmal atrial fibrillation and was maintained on sotalol. She is now off sotalol.  - Continue amiodarone 60 mg/hr for rate control.  - Restart Xarelto 20 mg daily this am.   - TEE-guided DCCV after she is better-diuresed.  - Eventually would benefit from atrial fibrillation ablation ([facility]LE-HF study).  - supp K. Check Mg. Keep K >4.0  and Mg >2.0 3. Mitral regurgitation: Functional MR, moderate.  4. Diverticulitis: Afebrile, WBCs not elevated.  - Continue cefdinir and Flagyl per IM.  5. Hypothyroidism: on levothyroxine. TSH elevated at 8.  -check Free T3/T4.  6. Hypokalemia: K 2.7 today in the setting of IV diuretics.  -supp w/ IV K. Repeat K lab at 1200 7. Hyperglycemia/Prediabetes: CBGs in the 400s this am. Hgb A1c 04/2019 was 6.1  -management per IM    Length of Stay: [address], PA-C  06/18/2019, 7:33 AM  Advanced Heart Failure Team Pager [phone] (M-F; [address])  Please contact [facility] for night-coverage after hours (4p -7a ) and weekends on [email or web address]  Patient seen with PA, agree with the above note.   She remains in atrial fibrillation with RVR, rate 100s-120s on amiodarone at 60.  Co-ox better at 60% today on milrinone 0.25. She diuresed very well yesterday on Lasix gtt, but CVP still elevated at 13.  Creatinine stable.   General: NAD Neck: JVP 12-14 cm, no thyromegaly or thyroid nodule.  Lungs: Clear to auscultation bilaterally with normal respiratory effort. CV: Lateral PMI.  Heart tachy, irregular S1/S2, no S3/S4, no murmur.  No peripheral edema.   Abdomen: Soft, nontender, no hepatosplenomegaly, no distention.  Skin: Intact without lesions or rashes.  Neurologic: Alert and oriented x 3.  Psych: Normal affect. Extremities: No clubbing or cyanosis.  HEENT: Normal.   She will need eventual TEE-guided DCCV.  Starting back on Xarelto this morning, will plan TEE-guided DCCV after 3rd dose on Thursday morning.  In the meantime, will continue amiodarone gtt at 60 mg/hr to try to rate-control.  As above, aim for ablation down the road.   Volume status improving with excellent diuresis, but CVP remains 13.  Co-ox better at 60%.   - Continue milrinone but with elevated HR will decrease to 0.125 and repeat co-ox.  - Continue Lasix 12 mg/hr again today.  - Continue digoxin and spironolactone.   - BP lower today, will stop Entresto and use losartan 12.5 mg bid.   Aggressive K repletion.   CRITICAL CARE Performed by: [name]  Total critical care time: 35 minutes  Critical care time was exclusive of separately billable procedures and treating other patients.  Critical care was necessary to treat or prevent imminent or life-threatening deterioration.  Critical care was time spent personally by me on the following activities: development of treatment plan with patient and/or surrogate as well as nursing, discussions with consultants, evaluation of patient's response to treatment, examination of patient, obtaining history from patient or surrogate, ordering and performing treatments and interventions, ordering and review of laboratory studies, ordering and review of radiographic studies, pulse oximetry and re-evaluation of patient's condition.  [name] 06/18/2019 8:22 AM

## 2019-06-18 NOTE — Progress Notes (Signed)
PROGRESS NOTE  Melinda Mckinney ZHY:[id number] DOB: November 20, 1955 DOA: 06/11/2019 PCP: Colon Branch, MD  HPI/Recap of past 24 hours: Melinda Mckinney is a 63 y.o. female with medical history significant of DM2, A.Fib on xarelto, CAD s/p stent.  Patient originally diagnosed with COVID 9/15.  Admitted with gastroenteritis due to COVID 10/9-10/13.  Developed worsening abd pain later in the month, CT abd/pelvis on 10/22 showed new acute diverticulitis not present on CT 10/9 (though at least the [place] findings in the lungs looked significantly better).  She was put on cipro/flagyl.  Symptoms have continued unabated.  Abd pain primarily LLQ but radiates across lower abdomen per patient.  No blood in stool.  Returns to ED today.   ED Course: Repeat CT abd/pelvis shows improved but persistent diverticulitis.  BPs soft in ED with lactate 2.7, improved with IVF, lactate down to 1.5.  Got rocephin / vanc.  A.Fib was in RVR, this improved on cardizem gtt.  Hospitalist asked to admit.  Echo 11/1 w/ severely reduced EF, 25-30%. LHC 11/2 w/ normal cors. RHC w/ high filling pressures and low CO.    LHC/RHC this admission with no significant coronary disease  Started on milrinone 11/2 for low CO. CI 1.6 on RHC. Co-ox 60% on milrinone 0.25 this morning.  Ongoing diuresing.   06/18/19: Patient was seen and examined at her bedside this morning.   She continues to be in A. fib with RVR.  She denies chest pain or dyspnea.  Admits to some dizziness.  Cardiology following.  Denies abdominal pain or nausea.  Assessment/Plan: Principal Problem:   Sepsis ([place]) Active Problems:   Essential hypertension   Coronary artery disease involving native coronary artery of native heart with angina pectoris (HCC)   Atrial fibrillation with rapid ventricular response (HCC)   Chronic systolic CHF (congestive heart failure) ([place])   COVID-19   Diverticulitis  Paroxysmal atrial fibrillation with RVR Patient managed on  diltiazem drip initially. Sotalol continued. Rate improved and she was transitioned to home regimen. Rates have worsened again and blood pressure remains soft. -Continue sotalol and diltiazem -Switched to heparin from Xarelto for heart cath, completed LHC. Back on xarelto. -Continue cardiology recommendations Closely monitor vital signs Per Heart failure team possible TEE guided cardioversion on thursday.  Acute on chronic systolic CHF Patient with reduced EF of 25-30% which is new. Previous EF of 45-50% from 2018. Unknown etiology. Recent Covid infection and currently in atrial fibrillation. Cardiology on board -Cardiology recommendations: IV lasix -Continue Daily weights/strict in and outs Ongoing diuresing 5.1 L urine output in the last 24 Management per the heart failure team  Diverticulitis Failed outpatient treatment with ciprofloxacin/flagyl. Started on Ceftriaxone/Flagyl on admission. Does not appear to have met sepsis criteria on admission. Blood cultures with no growth to date. Symptoms significantly improved. Afebrile. WBC normal -Continue cefdinir/flagyl  Essential hypertension/soft blood pressures Soft blood pressures. Also with history of orthostatic symptoms. Defer management to cardiology Maintain map greater than 65 as possible  Dizziness Related to orthostatic vitals. PT recommending home health. Some improvement.  Fall precautions  Throat pain likely secondary to oral thrush Appears to have evidence of thrush. -Continue Magic mouthwash QID  Pre-diabetes No evidence of diabetes on chart review. Highest hemoglobin A1C is 6.1%. Recommend diet modification.  Hypothyroidism Recent TSH of 1.6 -Continue Synthroid  Recent COVID-19 pneumonia Symptoms improved. Still with some dyspnea on exertion but much improved from weeks prior.  On 2 L of oxygen as needed at baseline  DVT prophylaxis: Xarelto Code Status:   Code Status: Full Code Family  Communication: None at bedside Disposition Plan:  Discharge home with home health services when cardiology signs off and patient is hemodynamically stable.  Consultants:   Cardiology  Procedures:   11/1: Transthoracic Echocardiogram IMPRESSIONS   1. Left ventricular ejection fraction, by visual estimation, is 25 to 30%. The left ventricle has severely decreased function. There is no left ventricular hypertrophy. 2. Left ventricular diastolic parameters are indeterminate. 3. The left ventricle demonstrates global hypokinesis. 4. Global right ventricle has mildly reduced systolic function.The right ventricular size is normal. No increase in right ventricular wall thickness. 5. Left atrial size was moderately dilated. 6. Right atrial size was mildly dilated. 7. The mitral valve is normal in structure. Moderate mitral valve regurgitation. No evidence of mitral stenosis. 8. The tricuspid valve is normal in structure. Tricuspid valve regurgitation moderate. 9. The aortic valve is normal in structure. Aortic valve regurgitation is mild. No evidence of aortic valve sclerosis or stenosis. 10. The pulmonic valve was normal in structure. Pulmonic valve regurgitation is mild. 11. Aortic dilatation noted. 12. There is mild dilatation of the ascending aorta measuring 39 mm. 13. Mildly elevated pulmonary artery systolic pressure. 14. The inferior vena cava is normal in size with greater than 50% respiratory variability, suggesting right atrial pressure of 3 mmHg.  FINDINGS Left Ventricle: Left ventricular ejection fraction, by visual estimation, is 25 to 30%. The left ventricle has severely decreased function. The left ventricle demonstrates global hypokinesis. There is no left ventricular hypertrophy. Left ventricular  diastolic parameters are indeterminate. Normal left atrial pressure.  Right Ventricle: The right ventricular size is normal. No increase in right ventricular wall  thickness. Global RV systolic function is has mildly reduced systolic function. The tricuspid regurgitant velocity is 2.46 m/s, and with an assumed right atrial  pressure of 8 mmHg, the estimated right ventricular systolic pressure is mildly elevated at 32.2 mmHg.  Left Atrium: Left atrial size was moderately dilated.  Right Atrium: Right atrial size was mildly dilated  Pericardium: There is no evidence of pericardial effusion.  Mitral Valve: The mitral valve is normal in structure. No evidence of mitral valve stenosis by observation. Moderate mitral valve regurgitation.  Tricuspid Valve: The tricuspid valve is normal in structure. Tricuspid valve regurgitation moderate.  Aortic Valve: The aortic valve is normal in structure. Aortic valve regurgitation is mild. Aortic regurgitation PHT measures 835 msec. The aortic valve is structurally normal, with no evidence of sclerosis or stenosis.  Pulmonic Valve: The pulmonic valve was normal in structure. Pulmonic valve regurgitation is mild.  Aorta: Aortic dilatation noted. There is mild dilatation of the ascending aorta measuring 39 mm.  Venous: The inferior vena cava is normal in size with greater than 50% respiratory variability, suggesting right atrial pressure of 3 mmHg.  IAS/Shunts: No atrial level shunt detected by color flow Doppler. No ventricular septal defect is seen or detected. There is no evidence of an atrial septal defect.  Antimicrobials:  Ceftriaxone (10/27>>  Flagyl (10/27>>    Objective: Vitals:   06/18/19 1430 06/18/19 1500 06/18/19 1530 06/18/19 1600  BP: 95/68 103/66 109/62 99/72  Pulse: 94 72 61 (!) 29  Resp: (!) 24 (!) 21 (!) 21 20  Temp:   98.3 F (36.8 C)   TempSrc:   Oral   SpO2: 97% 96% 98% 94%  Weight:      Height:        Intake/Output Summary (Last 24 hours) at  06/18/2019 1650 Last data filed at 06/18/2019 1531 Gross per 24 hour  Intake 1119.33 ml  Output 5401 ml  Net -4281.67 ml    Filed Weights   06/16/19 0500 06/18/19 0500  Weight: 110 kg 107.4 kg    Exam:   General: 63 y.o. year-old female well developed well nourished in no acute distress.  Alert and oriented x4.  Cardiovascular: Irregular rate and rhythm with no rubs or gallops.  No thyromegaly or JVD noted.    Respiratory: Mild rales at bases no wheezing noted.  Good inspiratory effort.  Abdomen: Soft nontender nondistended with normal bowel sounds x4 quadrants.  Musculoskeletal: Trace lower extremity edema. 2/4 pulses in all 4 extremities.  Psychiatry: Mood is appropriate for condition and setting   Data Reviewed: CBC: Recent Labs  Lab 06/12/19 0153 06/14/19 0813 06/16/19 1613 06/17/19 0751 06/17/19 1532 06/17/19 1631 06/17/19 1634 06/18/19 0223  WBC 7.4 5.5 5.7 8.4  --   --   --  7.2  HGB 10.8* 10.1* 10.7* 11.5* 11.9* 12.2 11.9* 10.5*  HCT 37.2 34.8* 35.9* 38.7 35.0* 36.0 35.0* 34.8*  MCV 96.4 96.1 93.2 93.5  --   --   --  91.1  PLT 300 248 231 297  --   --   --  407   Basic Metabolic Panel: Recent Labs  Lab 06/12/19 0153 06/14/19 0813 06/17/19 0751 06/17/19 1532 06/17/19 1631 06/17/19 1634 06/18/19 0223 06/18/19 1200  NA 136 139 138 142 141 141 134* 135  K 3.8 3.8 3.8 3.4* 3.5 3.5 2.7* 3.4*  CL 106 110 107  --   --   --  96* 91*  CO2 20* 23 21*  --   --   --  24 27  GLUCOSE 123* 107* 135*  --   --   --  401* 337*  BUN '14 9 10  '$ --   --   --  6* 6*  CREATININE 0.84 0.66 0.74  --   --   --  0.76 0.92  CALCIUM 8.4* 8.7* 9.2  --   --   --  8.2* 8.5*  MG  --   --   --   --   --   --   --  1.2*   GFR: Estimated Creatinine Clearance: 74.3 mL/min (by C-G formula based on SCr of 0.92 mg/dL). Liver Function Tests: No results for input(s): AST, ALT, ALKPHOS, BILITOT, PROT, ALBUMIN in the last 168 hours. No results for input(s): LIPASE, AMYLASE in the last 168 hours. No results for input(s): AMMONIA in the last 168 hours. Coagulation Profile: No results for input(s): INR,  PROTIME in the last 168 hours. Cardiac Enzymes: No results for input(s): CKTOTAL, CKMB, CKMBINDEX, TROPONINI in the last 168 hours. BNP (last 3 results) No results for input(s): PROBNP in the last 8760 hours. HbA1C: No results for input(s): HGBA1C in the last 72 hours. CBG: Recent Labs  Lab 06/17/19 2004 06/18/19 0004 06/18/19 0346 06/18/19 0752 06/18/19 1138  GLUCAP 102* 152* 153* 163* 160*   Lipid Profile: No results for input(s): CHOL, HDL, LDLCALC, TRIG, CHOLHDL, LDLDIRECT in the last 72 hours. Thyroid Function Tests: Recent Labs    06/18/19 0258 06/18/19 1200  TSH 8.342*  --   FREET4  --  1.89*   Anemia Panel: No results for input(s): VITAMINB12, FOLATE, FERRITIN, TIBC, IRON, RETICCTPCT in the last 72 hours. Urine analysis:    Component Value Date/Time   COLORURINE AMBER (A) 06/11/2019 [place]  06/11/2019 1526   LABSPEC >1.046 (H) 06/11/2019 1526   PHURINE 6.0 06/11/2019 1526   GLUCOSEU NEGATIVE 06/11/2019 1526   GLUCOSEU NEGATIVE 06/06/2019 1538   HGBUR SMALL (A) 06/11/2019 1526   BILIRUBINUR NEGATIVE 06/11/2019 [place] 06/11/2019 1526   PROTEINUR 30 (A) 06/11/2019 1526   UROBILINOGEN 0.2 06/06/2019 1538   NITRITE NEGATIVE 06/11/2019 1526   LEUKOCYTESUR TRACE (A) 06/11/2019 1526   Sepsis Labs: '@LABRCNTIP'$ (procalcitonin:4,lacticidven:4)  ) Recent Results (from the past 240 hour(s))  Culture, blood (Routine X 2) w Reflex to ID Panel     Status: None   Collection Time: 06/11/19 12:05 PM   Specimen: BLOOD  Result Value Ref Range Status   Specimen Description   Final    BLOOD RIGHT ANTECUBITAL Performed at [facility], [address] [place], [place] [address]    Special Requests   Final    BOTTLES DRAWN AEROBIC AND ANAEROBIC Blood Culture results may not be optimal due to an inadequate volume of blood received in culture bottles Performed at [facility], [address] [place], [place]  [address]    Culture   Final    NO GROWTH 5 DAYS Performed at [facility], [place] [address] [place], [place] [address]    Report Status 06/16/2019 FINAL  Final  Culture, blood (Routine X 2) w Reflex to ID Panel     Status: None   Collection Time: 06/11/19 12:55 PM   Specimen: BLOOD  Result Value Ref Range Status   Specimen Description   Final    BLOOD BLOOD RIGHT FOREARM Performed at [facility], [address] [place], [place] [address]    Special Requests   Final    BOTTLES DRAWN AEROBIC AND ANAEROBIC Blood Culture results may not be optimal due to an inadequate volume of blood received in culture bottles Performed at [facility], [address] [place], [place] [address]    Culture   Final    NO GROWTH 5 DAYS Performed at [facility], [place] [address], [place], [place] [address]    Report Status 06/16/2019 FINAL  Final  Urine culture     Status: Abnormal   Collection Time: 06/11/19  3:26 PM   Specimen: In/Out Cath Urine  Result Value Ref Range Status   Specimen Description   Final    IN/OUT CATH URINE Performed at [facility], [address] [place], [place] [address]    Special Requests   Final    NONE Performed at [facility], [address] [place], [place] [address]    Culture 10,000 COLONIES/mL YEAST (A)  Final   Report Status 06/13/2019 FINAL  Final  MRSA PCR Screening     Status: None   Collection Time: 06/11/19  9:52 PM   Specimen: Nasal Mucosa; Nasopharyngeal  Result Value Ref Range Status   MRSA by PCR NEGATIVE NEGATIVE Final    Comment:        The GeneXpert MRSA Assay (FDA approved for NASAL specimens only), is one component of a comprehensive MRSA colonization surveillance program. It is not intended to diagnose MRSA infection nor to guide or monitor treatment for MRSA infections. Performed at [facility], [place] [address], [place], [place] [address]       Studies: Dg  Chest Port 1 View  Result Date: 06/17/2019 CLINICAL DATA:  PICC line placement  EXAM: PORTABLE CHEST 1 VIEW COMPARISON:  06/11/2019 FINDINGS: Right PICC line tip is in the SVC. Cardiomegaly. Vascular congestion. Stable chronic interstitial prominence throughout the lungs. No effusions or acute bony abnormality. IMPRESSION: Cardiomegaly with vascular congestion. Stable chronic interstitial prominence. Electronically Signed   By: Rolm Baptise M.D.   On: 06/17/2019 21:50    Scheduled Meds:  allopurinol  100 mg Oral QHS   atorvastatin  40 mg Oral QHS   cefdinir  300 mg Oral Q12H   Chlorhexidine Gluconate Cloth  6 each Topical Daily   digoxin  0.125 mg Oral Daily   DULoxetine  60 mg Oral Daily   Gerhardt's butt cream   Topical QID   insulin aspart  0-9 Units Subcutaneous Q4H   levothyroxine  125 mcg Oral QAC breakfast   [START ON 06/19/2019] losartan  12.5 mg Oral Daily   magic mouthwash  15 mL Oral QID   metroNIDAZOLE  500 mg Oral Q8H   nystatin   Topical BID   pantoprazole  40 mg Oral BID   potassium chloride  40 mEq Oral BID   raloxifene  60 mg Oral QHS   rivaroxaban  20 mg Oral Daily   sodium chloride flush  10-40 mL Intracatheter Q12H   sodium chloride flush  10-40 mL Intracatheter Q12H   sodium chloride flush  3 mL Intravenous Q12H   spironolactone  12.5 mg Oral Daily    Continuous Infusions:  sodium chloride 10 mL/hr at 06/18/19 1152   amiodarone 60 mg/hr (06/18/19 1358)   furosemide (LASIX) infusion 12 mg/hr (06/18/19 1626)   magnesium sulfate bolus IVPB 4 g (06/18/19 1531)   milrinone 0.125 mcg/kg/min (06/18/19 1152)     LOS: 7 days     Kayleen Memos, MD [facility] Pager [phone]  If 7PM-7AM, please contact night-coverage [email or web address] Password [email or web address] 06/18/2019, 4:50 PM

## 2019-06-18 NOTE — Progress Notes (Signed)
Physical Therapy Treatment Patient Details Name: Melinda Mckinney MRN: [id number] DOB: Sep 17, 1955 Today's Date: 06/18/2019    History of Present Illness 63 y.o. female with a history of atrial fibrillation on Xarelto, Covid 9/20, CAD s/p stent, hypertension, systolic heart failure. Patient presented secondary to abdominal pain and found to have diverticulitis, failing outpatient treatment. Also  with atrial fibrillation with RVR. Pt s/p cardiac cath 06/17/2019.    PT Comments    Pt reports significant fatigue and is also limited by groin pain 2/2 cardiac cath. Pt with elevated HR, tachy from 115-151 during activity and at rest. Pt limited to bed-level exercise this session due to HR lability. Pt will benefit from continued acute PT services to reduce falls risk and restore independence in mobility. PT anticipates pt will continue to mobilize well with PT services once medical comorbidities are managed and continues to recommend home health PT and family support.   Follow Up Recommendations  Home health PT;Supervision for mobility/OOB     Equipment Recommendations  None recommended by PT    Recommendations for Other Services       Precautions / Restrictions Precautions Precautions: Fall Precaution Comments: recent falls Restrictions Weight Bearing Restrictions: No    Mobility  Bed Mobility Overal bed mobility: Needs Assistance Bed Mobility: Rolling Rolling: Supervision         General bed mobility comments: pt rolling to get comfortable in bed 2/2 groin pain  Transfers                    Ambulation/Gait                 Stairs             Wheelchair Mobility    Modified Rankin (Stroke Patients Only)       Balance                                            Cognition Arousal/Alertness: Awake/alert Behavior During Therapy: WFL for tasks assessed/performed Overall Cognitive Status: Within Functional Limits for tasks assessed                                  General Comments: video interpreter services utilized during session      Exercises General Exercises - Lower Extremity Ankle Circles/Pumps: AROM;Both;20 reps Gluteal Sets: AROM;Both;10 reps Hip ABduction/ADduction: AROM;Both;5 reps Straight Leg Raises: AROM;Both;10 reps    General Comments General comments (skin integrity, edema, etc.): pt with elevated HR during session, ranging from 115-151. Pt reports fatigue and is limited to bed-level therapeutic exercise at this time      Pertinent Vitals/Pain Pain Assessment: Faces Faces Pain Scale: Hurts even more Pain Location: groin Pain Descriptors / Indicators: Aching Pain Intervention(s): Limited activity within patient's tolerance    Home Living                      Prior Function            PT Goals (current goals can now be found in the care plan section) Acute Rehab PT Goals Patient Stated Goal: to feel stronger Progress towards PT goals: Not progressing toward goals - comment(limited by medical comorbidities)    Frequency    Min 3X/week      PT Plan Current  plan remains appropriate    Co-evaluation              AM-PAC PT "6 Clicks" Mobility   Outcome Measure  Help needed turning from your back to your side while in a flat bed without using bedrails?: None Help needed moving from lying on your back to sitting on the side of a flat bed without using bedrails?: None Help needed moving to and from a bed to a chair (including a wheelchair)?: A Little Help needed standing up from a chair using your arms (e.g., wheelchair or bedside chair)?: A Little Help needed to walk in hospital room?: A Little Help needed climbing 3-5 steps with a railing? : A Little 6 Click Score: 20    End of Session Equipment Utilized During Treatment: Oxygen Activity Tolerance: Patient limited by fatigue;Patient limited by pain Patient left: in bed;with call bell/phone within  reach Nurse Communication: Mobility status PT Visit Diagnosis: Difficulty in walking, not elsewhere classified (R26.2);Muscle weakness (generalized) (M62.81);History of falling (Z91.81)     Time: [id number] PT Time Calculation (min) (ACUTE ONLY): 16 min  Charges:  $Therapeutic Exercise: 8-22 mins                     [name], PT, DPT Acute Rehabilitation Pager: [phone]    [name] 06/18/2019, 9:31 AM

## 2019-06-19 ENCOUNTER — Encounter (HOSPITAL_COMMUNITY)
Admission: EM | Disposition: A | Discharge status: Home Health | Payer: Self-pay | Source: Home / Self Care | Attending: Family Medicine

## 2019-06-19 ENCOUNTER — Inpatient Hospital Stay (HOSPITAL_COMMUNITY): Payer: Medicare Other | Admitting: Anesthesiology

## 2019-06-19 ENCOUNTER — Inpatient Hospital Stay (HOSPITAL_COMMUNITY): Payer: Medicare Other

## 2019-06-19 DIAGNOSIS — I4891 Unspecified atrial fibrillation: Secondary | ICD-10-CM

## 2019-06-19 DIAGNOSIS — I361 Nonrheumatic tricuspid (valve) insufficiency: Secondary | ICD-10-CM

## 2019-06-19 DIAGNOSIS — I340 Nonrheumatic mitral (valve) insufficiency: Secondary | ICD-10-CM

## 2019-06-19 HISTORY — PX: CARDIOVERSION: SHX1299

## 2019-06-19 HISTORY — PX: TEE WITHOUT CARDIOVERSION: SHX5443

## 2019-06-19 LAB — GLUCOSE, CAPILLARY
Glucose-Capillary: 101 mg/dL — ABNORMAL HIGH (ref 70–99)
Glucose-Capillary: 119 mg/dL — ABNORMAL HIGH (ref 70–99)
Glucose-Capillary: 126 mg/dL — ABNORMAL HIGH (ref 70–99)
Glucose-Capillary: 138 mg/dL — ABNORMAL HIGH (ref 70–99)
Glucose-Capillary: 189 mg/dL — ABNORMAL HIGH (ref 70–99)
Glucose-Capillary: 285 mg/dL — ABNORMAL HIGH (ref 70–99)
Glucose-Capillary: 99 mg/dL (ref 70–99)

## 2019-06-19 LAB — POCT I-STAT EG7
Acid-Base Excess: 8 mmol/L — ABNORMAL HIGH (ref 0.0–2.0)
Bicarbonate: 33 mmol/L — ABNORMAL HIGH (ref 20.0–28.0)
Calcium, Ion: 1.05 mmol/L — ABNORMAL LOW (ref 1.15–1.40)
HCT: 36 % (ref 36.0–46.0)
Hemoglobin: 12.2 g/dL (ref 12.0–15.0)
O2 Saturation: 63 %
Patient temperature: 98.7
Potassium: 4 mmol/L (ref 3.5–5.1)
Sodium: 136 mmol/L (ref 135–145)
TCO2: 34 mmol/L — ABNORMAL HIGH (ref 22–32)
pCO2, Ven: 46.4 mmHg (ref 44.0–60.0)
pH, Ven: 7.459 — ABNORMAL HIGH (ref 7.250–7.430)
pO2, Ven: 31 mmHg — CL (ref 32.0–45.0)

## 2019-06-19 LAB — CBC
HCT: 36.7 % (ref 36.0–46.0)
Hemoglobin: 11.2 g/dL — ABNORMAL LOW (ref 12.0–15.0)
MCH: 27.2 pg (ref 26.0–34.0)
MCHC: 30.5 g/dL (ref 30.0–36.0)
MCV: 89.1 fL (ref 80.0–100.0)
Platelets: 322 10*3/uL (ref 150–400)
RBC: 4.12 MIL/uL (ref 3.87–5.11)
RDW: 15.9 % — ABNORMAL HIGH (ref 11.5–15.5)
WBC: 7.6 10*3/uL (ref 4.0–10.5)
nRBC: 0 % (ref 0.0–0.2)

## 2019-06-19 LAB — BASIC METABOLIC PANEL
Anion gap: 14 (ref 5–15)
BUN: 6 mg/dL — ABNORMAL LOW (ref 8–23)
CO2: 28 mmol/L (ref 22–32)
Calcium: 8.3 mg/dL — ABNORMAL LOW (ref 8.9–10.3)
Chloride: 92 mmol/L — ABNORMAL LOW (ref 98–111)
Creatinine, Ser: 0.78 mg/dL (ref 0.44–1.00)
GFR calc Af Amer: >60 mL/min (ref >60)
GFR calc non Af Amer: >60 mL/min (ref >60)
Glucose, Bld: 273 mg/dL — ABNORMAL HIGH (ref 70–99)
Potassium: 4 mmol/L (ref 3.5–5.1)
Sodium: 134 mmol/L — ABNORMAL LOW (ref 135–145)

## 2019-06-19 LAB — LACTIC ACID, PLASMA: Lactic Acid, Venous: 1.3 mmol/L (ref 0.5–1.9)

## 2019-06-19 LAB — MAGNESIUM: Magnesium: 2 mg/dL (ref 1.7–2.4)

## 2019-06-19 LAB — T4, FREE: Free T4: 2.7 ng/dL — ABNORMAL HIGH (ref 0.61–1.12)

## 2019-06-19 LAB — T3, FREE: T3, Free: 2.3 pg/mL (ref 2.0–4.4)

## 2019-06-19 LAB — COOXEMETRY PANEL
Carboxyhemoglobin: 1.3 % (ref 0.5–1.5)
Methemoglobin: 0.7 % (ref 0.0–1.5)
O2 Saturation: 67.2 %
Total hemoglobin: 12.1 g/dL (ref 12.0–16.0)

## 2019-06-19 SURGERY — ECHOCARDIOGRAM, TRANSESOPHAGEAL
Anesthesia: Monitor Anesthesia Care

## 2019-06-19 MED ORDER — SODIUM CHLORIDE 0.9 % IV SOLN: 250.0000 mL | INTRAVENOUS | Status: DC

## 2019-06-19 MED ORDER — PROPOFOL 10 MG/ML IV BOLUS
INTRAVENOUS | Status: DC | PRN
  Administered 2019-06-19 (×2): 20 mg via INTRAVENOUS

## 2019-06-19 MED ORDER — LACTATED RINGERS IV SOLN
INTRAVENOUS | Status: DC | PRN
  Administered 2019-06-19: 16:00:00 via INTRAVENOUS

## 2019-06-19 MED ORDER — AMIODARONE LOAD VIA INFUSION
150.0000 mg | Freq: Once | INTRAVENOUS | Status: AC
  Administered 2019-06-19 (×2): 150 mg via INTRAVENOUS
  Filled 2019-06-19: qty 83.34

## 2019-06-19 MED ORDER — SODIUM CHLORIDE 0.9% FLUSH: 3.0000 mL | INTRAVENOUS | Status: DC | PRN

## 2019-06-19 MED ORDER — SODIUM CHLORIDE 0.9% FLUSH: 3.0000 mL | Freq: Two times a day (BID) | INTRAVENOUS | Status: DC

## 2019-06-19 MED ORDER — FUROSEMIDE 10 MG/ML IJ SOLN
80.0000 mg | Freq: Once | INTRAMUSCULAR | Status: AC
  Administered 2019-06-19: 80 mg via INTRAVENOUS
  Filled 2019-06-19: qty 8

## 2019-06-19 MED ORDER — SODIUM CHLORIDE 0.9 % IV SOLN: INTRAVENOUS | Status: DC

## 2019-06-19 MED ORDER — PROPOFOL 500 MG/50ML IV EMUL
INTRAVENOUS | Status: DC | PRN
  Administered 2019-06-19: 50 ug/kg/min via INTRAVENOUS

## 2019-06-19 MED ORDER — POTASSIUM CHLORIDE CRYS ER 20 MEQ PO TBCR
30.0000 meq | EXTENDED_RELEASE_TABLET | Freq: Once | ORAL | Status: AC
  Administered 2019-06-19: 30 meq via ORAL
  Filled 2019-06-19: qty 1

## 2019-06-19 NOTE — Progress Notes (Signed)
  Patient's HR remained 130s-150s off milrinone.  I talked with the patient and family member about proceeding with TEE-guided DCCV today rather than waiting another day.  I am concerned she will not be able to tolerate the high HR overnight.   We discussed risks benefits, she and family member understand that ideally she would have 3 doses of Xarelto but we are doing this with more urgent timing.    [name] 06/19/2019 4:12 PM

## 2019-06-19 NOTE — Procedures (Signed)
Electrical Cardioversion Procedure Note Melinda Mckinney [id number] 1955/10/22  Procedure: Electrical Cardioversion Indications:  Atrial Fibrillation  Procedure Details Consent: Risks of procedure as well as the alternatives and risks of each were explained to the (patient/caregiver).  Consent for procedure obtained. Time Out: Verified patient identification, verified procedure, site/side was marked, verified correct patient position, special equipment/implants available, medications/allergies/relevent history reviewed, required imaging and test results available.  Performed  Patient placed on cardiac monitor, pulse oximetry, supplemental oxygen as necessary.  Sedation given: Propofol per anesthesiology Pacer pads placed anterior and posterior chest.  Cardioverted 4 times, using sternal pressure after the first attempt.   Cardioverted at [place].  Evaluation Findings: Post procedure EKG shows: NSR Complications: None Patient did tolerate procedure well.   Melinda Mckinney 06/19/2019, 4:39 PM

## 2019-06-19 NOTE — Progress Notes (Addendum)
  Advanced Heart Failure Rounding Note  PCP-Cardiologist: Tiffany [place], MD   Subjective:    Echo 11/1 w/ severely reduced EF, 25-30%. LHC 11/2 w/ normal cors. RHC w/ high filling pressures and low CO.   Started on milrinone 11/2 for low CO. CI 1.6 on RHC. Initial Co-ox was 60%. Milrinone weaned down yesterday to 0.125. Repeat Co-ox 67% today.  Diuresed 3L yesterday w/ Lasix gtt. However lasix gtt stopped overnight due to hypotension. Repeat CVP 8-9 this am.   Wt continues to trend down, 236>>234 lb. No dyspnea at rest. SCr 0.78. K 4.0.   BP 107/59. MAP 71.   Remains in afib w/ RVR in the 130s-160s. On IV amio 60 mg/hr. Fairly asymptomatic w/ afib.  TFTs abnormal but values contradict. Free T4 is elevated at 1.89 but TSH is also elevated at 8.  IM managing diverticulitis w/ abx. Continues w/ mild abd pain. Afebrile. WBC ct normal.    Objective:   Weight Range: 106.3 kg Body mass index is 39 kg/m.   Vital Signs:   Temp:  [98.1 F (36.7 C)-98.9 F (37.2 C)] 98.1 F (36.7 C) (11/04 0747) Pulse Rate:  [25-152] 78 (11/04 0700) Resp:  [15-25] 18 (11/04 0700) BP: (77-122)/(58-100) 90/74 (11/04 0700) SpO2:  [85 %-100 %] 96 % (11/04 0700) Weight:  [106.3 kg] 106.3 kg (11/04 0400) Last BM Date: 06/17/19  Weight change: Filed Weights   06/16/19 0500 06/18/19 0500 06/19/19 0400  Weight: 110 kg 107.4 kg 106.3 kg    Intake/Output:   Intake/Output Summary (Last 24 hours) at 06/19/2019 0801 Last data filed at 06/19/2019 0700 Gross per 24 hour  Intake 1382.21 ml  Output 3125 ml  Net -1742.79 ml      Physical Exam   CVP 9 PHYSICAL EXAM: General:  Well appearing obese [place] female. No respiratory difficulty HEENT: normal Neck: supple. no JVD. Carotids 2+ bilat; no bruits. No lymphadenopathy or thyromegaly appreciated. Cor: PMI nondisplaced. irregularly irregular rhythm, tachy rate. No rubs, gallops or murmurs. Lungs: clear Abdomen: soft, mildly tentender,  nondistended. No hepatosplenomegaly. No bruits or masses. Good bowel sounds. Extremities: no cyanosis, clubbing, rash, edema Neuro: alert & oriented x 3, cranial nerves grossly intact. moves all 4 extremities w/o difficulty. Affect pleasant.   Telemetry   afib w/ RVR, fluctuating in the 130s- 160s. 3 beat run of NSVT on tele  EKG    N/A  Labs    CBC Recent Labs    06/18/19 0223 06/18/19 2239 06/19/19 0241  WBC 7.2  --  7.6  HGB 10.5* 12.2 11.2*  HCT 34.8* 36.0 36.7  MCV 91.1  --  89.1  PLT 284  --  322   Basic Metabolic Panel Recent Labs    06/18/19 2237 06/18/19 2239 06/19/19 0241  NA 134* 133* 134*  K 3.4* 3.5 4.0  CL 94*  --  92*  CO2 29  --  28  GLUCOSE 270*  --  273*  BUN 5*  --  6*  CREATININE 0.81  --  0.78  CALCIUM 8.2*  --  8.3*  MG 2.2  --  2.0   Liver Function Tests No results for input(s): AST, ALT, ALKPHOS, BILITOT, PROT, ALBUMIN in the last 72 hours. No results for input(s): LIPASE, AMYLASE in the last 72 hours. Cardiac Enzymes No results for input(s): CKTOTAL, CKMB, CKMBINDEX, TROPONINI in the last 72 hours.  BNP: BNP (last 3 results) Recent Labs    05/26/19 0720 05/27/19 0245 05/28/19 0630    BNP 604.0* 330.7* 345.9*    ProBNP (last 3 results) No results for input(s): PROBNP in the last 8760 hours.   D-Dimer No results for input(s): DDIMER in the last 72 hours. Hemoglobin A1C No results for input(s): HGBA1C in the last 72 hours. Fasting Lipid Panel No results for input(s): CHOL, HDL, LDLCALC, TRIG, CHOLHDL, LDLDIRECT in the last 72 hours. Thyroid Function Tests Recent Labs    06/18/19 0258 06/18/19 1200  TSH 8.342*  --   T3FREE  --  2.3    Other results:   Imaging    No results found.   Medications:     Scheduled Medications: . allopurinol  100 mg Oral QHS  . atorvastatin  40 mg Oral QHS  . cefdinir  300 mg Oral Q12H  . Chlorhexidine Gluconate Cloth  6 each Topical Daily  . digoxin  0.125 mg Oral Daily  .  DULoxetine  60 mg Oral Daily  . Gerhardt's butt cream   Topical QID  . insulin aspart  0-9 Units Subcutaneous Q4H  . levothyroxine  125 mcg Oral QAC breakfast  . magic mouthwash  15 mL Oral QID  . metroNIDAZOLE  500 mg Oral Q8H  . nystatin   Topical BID  . pantoprazole  40 mg Oral BID  . raloxifene  60 mg Oral QHS  . rivaroxaban  20 mg Oral Daily  . sodium chloride flush  10-40 mL Intracatheter Q12H  . sodium chloride flush  10-40 mL Intracatheter Q12H  . sodium chloride flush  3 mL Intravenous Q12H  . spironolactone  12.5 mg Oral Daily    Infusions: . sodium chloride 10 mL/hr at 06/19/19 0700  . amiodarone 60 mg/hr (06/19/19 0700)  . furosemide (LASIX) infusion Stopped (06/19/19 0326)  . milrinone 0.125 mcg/kg/min (06/19/19 0700)    PRN Medications: sodium chloride, acetaminophen, cyclobenzaprine, HYDROmorphone (DILAUDID) injection, morphine injection, ondansetron (ZOFRAN) IV, ondansetron **OR** [DISCONTINUED] ondansetron (ZOFRAN) IV, sodium chloride flush, sodium chloride flush, sodium chloride flush, zolpidem    Patient Profile   63 y/o female w/ h/o CAD s/p PCI in 2010, chronic combined systolic and diastolic HF (EF 45-50% in 2018), atrial fibrillation on chronic a/c w/ Xarelto, HTN, prediabetes, hypothyroidism and diagnosed w/ COVID 19 in Sep 2020 and was treated at [facility]. COVID infection c/b PNA and gastroenteritis. Admitted to [facility] 10/27 for diverticulitis and afib w/ RVR and found to have worsening systolic function w/ EF now at 25-30%. Subsequent LHC showed widely patent coronaries. RHC c/w severely elevated filling pressures and low CO.   Assessment/Plan   1. Acute systolic CHF:  Nonischemic cardiomyopathy.  Echo in 2018 with EF 45-50%.  Echo this admission with EF 25-30%, diffuse hypokinesis, mildly decreased RV systolic function, moderate MR. LHC/RHC this admission with no significant coronary disease, markedly elevated filling pressures and low cardiac  output (CI 1.6).  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 ESR elevated at 38. SCr 1.0 (down from 2.3 3 wks ago). TSH elevated at 8.34. Free T4 also elevated at 1.89.  - has PICC. Initial Co-ox 60%.  - Now on milrinone 0.125.  She is on amiodarone gtt for rate control.  - Lasix gtt discontinued overnight due to hypotension - CVP 9 this AM. MAP 71. Co-ox 67% on 0.125 of milrinone  - BP did not tolerate Entresto. Losartan now on hold due to low BP - Continue Spironolactone 12.5 daily.  - Continue digoxin   0.125 daily.  - She will need TEE-guided DCCV for rapid afib.  - Consider stopping milrinone today and repeat co-ox this afternoon. ? If milrinone exacerbating afib.  - Can resume scheduled IV lasix. Give a dose of 80 mg x1 . Follow CVPs - Eventually, she will need cardiac MRI. ? 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 ESR elevated at 38. SCr 1.0 (down from 2.3 3 wks ago). Also w/ difficult to control atrial arrhthymia.  2. Atrial fibrillation with RVR: Persistent atrial fibrillation, it appears, at least since 9/20 when she was admitted with COVID-19. Prior, she had paroxysmal atrial fibrillation and was maintained on sotalol. She is now off sotalol.  - remains in afib w/ RVR in the 130s-160s. MAP 71.  - On amiodarone 60 mg/hr for rate control. ? rebolus - Continue Xarelto 20 mg daily   - Plan TEE-guided DCCV, hopefully tomorrow if schedule allows.   - Eventually would benefit from atrial fibrillation ablation (CASTLE-HF study).  - supp K. Check Mg. Keep K >4.0 and Mg >2.0 - given body habitus, needs outpatient sleep study if no previous evaluation  3. Mitral regurgitation: Functional MR, moderate.  4. Diverticulitis: Afebrile, WBCs not elevated.  - Continue cefdinir and Flagyl per IM.  5. Hypothyroidism: on levothyroxine. Conflicting thyroid levels. TSH elevated at 8 but Free T4 also elevated at 1.89.   6. Hypokalemia: resolved w/ supplementation. K 4.0 this am.  7. Hyperglycemia/Prediabetes: CBGs improving, in the 200s this am. Hgb A1c 04/2019 was 6.1  -management per IM    Length of Stay: [address], PA-C  06/19/2019, 8:01 AM  Advanced Heart Failure Team Pager [phone] (M-F; 7a - 4p)  Please contact [facility] for night-coverage after hours (4p -7a ) and weekends on [email or web address]  Patient seen with PA, agree with the above note.   Patient in atrial fibrillation with RVR this morning, HR 130s-150s, uncontrolled.  BP is stable.  Co-ox is 67% with CVP 8-9.  Lasix was held, she remains on milrinone 0.125 and amiodarone 60 mg/hr.  She is getting her 2nd dose of Xarelto this morning.   She feels better, no dyspnea.   General: NAD Neck: JVP 8 cm, no thyromegaly or thyroid nodule.  Lungs: Clear to auscultation bilaterally with normal respiratory effort. CV: Nondisplaced PMI.  Heart tachy, irregular S1/S2, no S3/S4, no murmur.  No peripheral edema.   Abdomen: Soft, nontender, no hepatosplenomegaly, no distention.  Skin: Intact without lesions or rashes.  Neurologic: Alert and oriented x 3.  Psych: Normal affect. Extremities: No clubbing or cyanosis.  HEENT: Normal.   She is in atrial fibrillation with RVR this morning, HR in 130s-150s.   - Stop milrinone gtt with co-ox 67%.  - Continue amiodarone gtt 60 mg/hr, will give bolus.  - If HR does not slow, she will need TEE-guided DCCV today.  However, ideally would get 3 doses of Xarelto in her which would be tomorrow.  Will check her later this morning to see if she slows enough to wait, otherwise will need today. Keep NPO for now.  - Continue digoxin.   CVP 8-9 with co-ox 67%.  Stop milrinone today.  Now off Lasix gtt, will give 1 dose Lasix 80 mg IV this morning.  Continue current spironolacteon and digoxin.   CRITICAL CARE Performed by: [name]  Total critical care time: 40 minutes  Critical care time was  exclusive of separately billable procedures and treating other patients.  Critical care was necessary to treat or  prevent imminent or life-threatening deterioration.  Critical care was time spent personally by me on the following activities: development of treatment plan with patient and/or surrogate as well as nursing, discussions with consultants, evaluation of patient's response to treatment, examination of patient, obtaining history from patient or surrogate, ordering and performing treatments and interventions, ordering and review of laboratory studies, ordering and review of radiographic studies, pulse oximetry and re-evaluation of patient's condition.  Eliannah Hinde 06/19/2019 8:53 AM   

## 2019-06-19 NOTE — Interval H&P Note (Signed)
History and Physical Interval Note:  06/19/2019 4:10 PM  [place]  has presented today for surgery, with the diagnosis of afib.  The various methods of treatment have been discussed with the patient and family. After consideration of risks, benefits and other options for treatment, the patient has consented to  Procedure(s): TRANSESOPHAGEAL ECHOCARDIOGRAM (TEE) (N/A) CARDIOVERSION (N/A) as a surgical intervention.  The patient's history has been reviewed, patient examined, no change in status, stable for surgery.  I have reviewed the patient's chart and labs.  Questions were answered to the patient's satisfaction.     Chasta Deshpande [employer]

## 2019-06-19 NOTE — Anesthesia Preprocedure Evaluation (Signed)
Anesthesia Evaluation  Patient identified by MRN, date of birth, ID band Patient awake    Reviewed: Allergy & Precautions, NPO status , Patient's Chart, lab work & pertinent test results  Airway Mallampati: II  TM Distance: >3 FB Neck ROM: Full    Dental no notable dental hx.    Pulmonary asthma , former smoker,    Pulmonary exam normal breath sounds clear to auscultation       Cardiovascular hypertension, Pt. on medications + angina + CAD, + Cardiac Stents and +CHF  Normal cardiovascular exam+ dysrhythmias Atrial Fibrillation  Rhythm:Regular Rate:Normal     Neuro/Psych Anxiety Depression negative neurological ROS     GI/Hepatic Neg liver ROS, GERD  ,  Endo/Other  diabetesHypothyroidism   Renal/GU negative Renal ROS  negative genitourinary   Musculoskeletal  (+) Arthritis , Osteoarthritis,  Fibromyalgia -  Abdominal   Peds negative pediatric ROS (+)  Hematology negative hematology ROS (+)   Anesthesia Other Findings Afib with RVR  Reproductive/Obstetrics negative OB ROS                             Anesthesia Physical  Anesthesia Plan  ASA: III  Anesthesia Plan: MAC   Post-op Pain Management:    Induction: Intravenous  PONV Risk Score and Plan: 2 and Treatment may vary due to age or medical condition  Airway Management Planned: Simple Face Mask  Additional Equipment:   Intra-op Plan:   Post-operative Plan:   Informed Consent: I have reviewed the patients History and Physical, chart, labs and discussed the procedure including the risks, benefits and alternatives for the proposed anesthesia with the patient or authorized representative who has indicated his/her understanding and acceptance.     Dental advisory given  Plan Discussed with:   Anesthesia Plan Comments:         Anesthesia Quick Evaluation

## 2019-06-19 NOTE — CV Procedure (Signed)
Procedure: TEE  Indication: Atrial fibrillation with RVR.    Findings: Please see echo section for full report.  The left ventricle appeared moderately dilated with diffuse hypokinesis, EF 15%.  No LV thrombus.  The right ventricle appeared moderately dilated with moderate systolic dysfunction.  Moderate left atrial enlargement, no LA thrombus.  Moderate right atrial enlargement.  No ASD/PFO by color doppler.  There was mild tricuspid regurgitation.  There was moderate mitral regurgitation (no systolic flow reversal in the pulmonary vein doppler pattern). Trileaflet aortic valve with mild regurgitation, no stenosis.  Normal caliber thoracic aorta with minimal plaque.   May proceed to DCCV.   [name] 06/19/2019 4:39 PM

## 2019-06-19 NOTE — Progress Notes (Signed)
Lasix drip held as ordered by dr. Launa Grill MD. Will recheck CVp in 2 hours as ordered

## 2019-06-19 NOTE — Progress Notes (Signed)
Patient's heart rate maintained at 140s-160s rate despite interventions. CVP recheck is 9 mmHg . Dr. Launa Grill updated and suggested Lasix to be off until 7am. All am labs were reviewed with the physician. Patient denies any symptoms. Plan to be cardioverted today, NPO status maintained. 06/19/19 5:39 AM

## 2019-06-19 NOTE — Progress Notes (Signed)
  Echocardiogram Echocardiogram Transesophageal has been performed.  Melinda Mckinney 06/19/2019, 4:44 PM

## 2019-06-19 NOTE — Progress Notes (Signed)
PROGRESS NOTE  Melinda Mckinney [id number] DOB: 1955/08/19 DOA: 06/11/2019 PCP: Colon Branch, MD  HPI/Recap of past 24 hours: Melinda Mckinney is a 63 y.o. female with medical history significant of DM2, A.Fib on xarelto, CAD s/p stent.  Patient originally diagnosed with COVID19 on 9/15.  Admitted with gastroenteritis due to COVID 10/9-10/13.  Developed worsening abd pain later in the month, CT abd/pelvis on 10/22 showed new acute diverticulitis not present on CT 10/9. She was put on cipro/flagyl.  She continued to have left lower quadrant abdominal pain so came back to the emergency room. In the emergency room, repeat CT scan of the abdomen pelvis showed improvement of diverticulitis.  Blood pressure was soft.  Lactate was 2.7.  Started on antibiotics.  Found to be on A. fib with RVR and treated with Cardizem drip. Echocardiogram showed severely reduced ejection fraction to 25%.  Underwent cardiac catheterization with normal coronaries.  Patient started on milrinone, amiodarone and Lasix and admitted to progressive care unit.  Subjective: Patient seen and examined.  Remains in ICU.  Patient herself has occasional palpitation but denies any symptoms.  No nausea or vomiting.  Staying n.p.o. in anticipation for any need for DC cardioversion today. Telemetry shows persistent A. fib, heart rate 160s on amiodarone.   Assessment/Plan: Principal Problem:   Sepsis ([place]) Active Problems:   Essential hypertension   Coronary artery disease involving native coronary artery of native heart with angina pectoris (HCC)   Atrial fibrillation with rapid ventricular response (HCC)   Chronic systolic CHF (congestive heart failure) ([place])   COVID-19   Diverticulitis  Paroxysmal atrial fibrillation with RVR Patient managed on diltiazem drip initially.  Persistent A. fib, bolus and ongoing amiodarone infusion.  On digoxin.   Plan for TEE and DC cardioversion.  On Xarelto.  Acute on chronic systolic CHF,  nonischemic cardiomyopathy. Patient with reduced EF of 25-30% which is new. Previous EF of 45-50% from 2018. Unknown etiology. Recent Covid infection and currently in atrial fibrillation.  Followed by heart failure team. 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 On IV Lasix with good response and diuresing.  On hold due to hypotension. On milrinone, held to control the heart rate. Remains on amiodarone infusion. Added Aldactone. Blood pressures not permitting Entresto or losartan.  Acute sigmoid diverticulitis Failed outpatient treatment with ciprofloxacin/flagyl.  Symptoms improved.   Already received prolonged treatment.   Finis 10 days of ceftriaxone and Flagyl and stop.    Essential hypertension/soft blood pressures Blood pressure soft.  Antihypertensives on hold.  Pre-diabetes No evidence of diabetes on chart review. Highest hemoglobin A1C is 6.1%. Recommend diet modification.  Hypothyroidism Recent TSH of 1.6 -Continue Synthroid  Recent COVID-19 pneumonia Symptoms improved. Still with some dyspnea on exertion but much improved from weeks prior.  On 2 L of oxygen as needed at baseline. She will need sleep studies after discharge.   DVT prophylaxis: Xarelto Code Status:  Full Code Family Communication:  None Disposition Plan:  Pending clinical improvement.  Remains in progressive care unit.  Consultants:   Cardiology  Procedures:   11/1: Transthoracic Echocardiogram IMPRESSIONS   1. Left ventricular ejection fraction, by visual estimation, is 25 to 30%. The left ventricle has severely decreased function. There is no left ventricular hypertrophy. 2. Left ventricular diastolic parameters are indeterminate. 3. The left ventricle demonstrates global hypokinesis. 4. Global right ventricle has mildly reduced systolic function.The right ventricular size is normal. No increase in right ventricular wall thickness. 5. Left atrial size was moderately dilated. 6.  Right atrial size was mildly dilated. 7. The mitral valve is normal in structure. Moderate mitral valve regurgitation. No evidence of mitral stenosis. 8. The tricuspid valve is normal in structure. Tricuspid valve regurgitation moderate. 9. The aortic valve is normal in structure. Aortic valve regurgitation is mild. No evidence of aortic valve sclerosis or stenosis. 10. The pulmonic valve was normal in structure. Pulmonic valve regurgitation is mild. 11. Aortic dilatation noted. 12. There is mild dilatation of the ascending aorta measuring 39 mm. 13. Mildly elevated pulmonary artery systolic pressure. 14. The inferior vena cava is normal in size with greater than 50% respiratory variability, suggesting right atrial pressure of 3 mmHg.  FINDINGS Left Ventricle: Left ventricular ejection fraction, by visual estimation, is 25 to 30%. The left ventricle has severely decreased function. The left ventricle demonstrates global hypokinesis. There is no left ventricular hypertrophy. Left ventricular  diastolic parameters are indeterminate. Normal left atrial pressure.  Right Ventricle: The right ventricular size is normal. No increase in right ventricular wall thickness. Global RV systolic function is has mildly reduced systolic function. The tricuspid regurgitant velocity is 2.46 m/s, and with an assumed right atrial  pressure of 8 mmHg, the estimated right ventricular systolic pressure is mildly elevated at 32.2 mmHg.  Left Atrium: Left atrial size was moderately dilated.  Right Atrium: Right atrial size was mildly dilated  Pericardium: There is no evidence of pericardial effusion.  Mitral Valve: The mitral valve is normal in structure. No evidence of mitral valve stenosis by observation. Moderate mitral valve regurgitation.  Tricuspid Valve: The tricuspid valve is normal in structure. Tricuspid valve regurgitation moderate.  Aortic Valve: The aortic valve is normal in structure.  Aortic valve regurgitation is mild. Aortic regurgitation PHT measures 835 msec. The aortic valve is structurally normal, with no evidence of sclerosis or stenosis.  Pulmonic Valve: The pulmonic valve was normal in structure. Pulmonic valve regurgitation is mild.  Aorta: Aortic dilatation noted. There is mild dilatation of the ascending aorta measuring 39 mm.  Venous: The inferior vena cava is normal in size with greater than 50% respiratory variability, suggesting right atrial pressure of 3 mmHg.  IAS/Shunts: No atrial level shunt detected by color flow Doppler. No ventricular septal defect is seen or detected. There is no evidence of an atrial septal defect.  Antimicrobials:  Ceftriaxone (10/27>>  Flagyl (10/27>>    Objective: Vitals:   06/19/19 1230 06/19/19 1300 06/19/19 1330 06/19/19 1400  BP:  (!) 114/97 108/82 93/72  Pulse: (!) 25 91 (!) 119 (!) 48  Resp: 20 (!) 27 20 20   Temp:      TempSrc:      SpO2: 98% 98% 97% 98%  Weight:      Height:        Intake/Output Summary (Last 24 hours) at 06/19/2019 1455 Last data filed at 06/19/2019 1430 Gross per 24 hour  Intake 1467.04 ml  Output 3160 ml  Net -1692.96 ml   Filed Weights   06/16/19 0500 06/18/19 0500 06/19/19 0400  Weight: 110 kg 107.4 kg 106.3 kg    Exam:  Physical Exam  Constitutional: She is oriented to person, place, and time and well-developed, well-nourished, and in no distress. No distress.  Not in any distress.  HENT:  Head: Normocephalic and atraumatic.  Neck: No tracheal deviation present. No thyromegaly present.  Cardiovascular:  Irregularly irregular.  No added sounds.  Pulmonary/Chest:  No added sounds  Musculoskeletal:        General: No deformity or edema.  Neurological: She is alert and oriented to person, place, and time.  Psychiatric: Affect normal.    Data Reviewed: CBC: Recent Labs  Lab 06/14/19 0813 06/16/19 1613 06/17/19 0751  06/17/19 1631 06/17/19 1634 06/18/19  0223 06/18/19 2239 06/19/19 0241  WBC 5.5 5.7 8.4  --   --   --  7.2  --  7.6  HGB 10.1* 10.7* 11.5*   < > 12.2 11.9* 10.5* 12.2 11.2*  HCT 34.8* 35.9* 38.7   < > 36.0 35.0* 34.8* 36.0 36.7  MCV 96.1 93.2 93.5  --   --   --  91.1  --  89.1  PLT 248 231 297  --   --   --  284  --  322   < > = values in this interval not displayed.   Basic Metabolic Panel: Recent Labs  Lab 06/17/19 0751  06/18/19 0223 06/18/19 1200 06/18/19 2237 06/18/19 2239 06/19/19 0241  NA 138   < > 134* 135 134* 133* 134*  K 3.8   < > 2.7* 3.4* 3.4* 3.5 4.0  CL 107  --  96* 91* 94*  --  92*  CO2 21*  --  24 27 29   --  28  GLUCOSE 135*  --  401* 337* 270*  --  273*  BUN 10  --  6* 6* 5*  --  6*  CREATININE 0.74  --  0.76 0.92 0.81  --  0.78  CALCIUM 9.2  --  8.2* 8.5* 8.2*  --  8.3*  MG  --   --   --  1.2* 2.2  --  2.0   < > = values in this interval not displayed.   GFR: Estimated Creatinine Clearance: 84.9 mL/min (by C-G formula based on SCr of 0.78 mg/dL). Liver Function Tests: No results for input(s): AST, ALT, ALKPHOS, BILITOT, PROT, ALBUMIN in the last 168 hours. No results for input(s): LIPASE, AMYLASE in the last 168 hours. No results for input(s): AMMONIA in the last 168 hours. Coagulation Profile: No results for input(s): INR, PROTIME in the last 168 hours. Cardiac Enzymes: No results for input(s): CKTOTAL, CKMB, CKMBINDEX, TROPONINI in the last 168 hours. BNP (last 3 results) No results for input(s): PROBNP in the last 8760 hours. HbA1C: No results for input(s): HGBA1C in the last 72 hours. CBG: Recent Labs  Lab 06/18/19 2334 06/19/19 0341 06/19/19 0648 06/19/19 0750 06/19/19 1211  GLUCAP 138* 189* 99 126* 119*   Lipid Profile: No results for input(s): CHOL, HDL, LDLCALC, TRIG, CHOLHDL, LDLDIRECT in the last 72 hours. Thyroid Function Tests: Recent Labs    06/18/19 0258  06/18/19 1200 06/19/19 0241  TSH 8.342*  --   --   --   FREET4  --    < > 1.89* 2.70*  T3FREE  --   --   2.3  --    < > = values in this interval not displayed.   Anemia Panel: No results for input(s): VITAMINB12, FOLATE, FERRITIN, TIBC, IRON, RETICCTPCT in the last 72 hours. Urine analysis:    Component Value Date/Time   COLORURINE AMBER (A) 06/11/2019 1526   APPEARANCEUR CLEAR 06/11/2019 1526   LABSPEC >1.046 (H) 06/11/2019 1526   PHURINE 6.0 06/11/2019 [place] 06/11/2019 1526   GLUCOSEU NEGATIVE 06/06/2019 1538   HGBUR SMALL (A) 06/11/2019 1526   BILIRUBINUR NEGATIVE 06/11/2019 [place] 06/11/2019 1526   PROTEINUR 30 (A) 06/11/2019 1526   UROBILINOGEN 0.2 06/06/2019 1538  NITRITE NEGATIVE 06/11/2019 1526   LEUKOCYTESUR TRACE (A) 06/11/2019 1526   Sepsis Labs: @LABRCNTIP (procalcitonin:4,lacticidven:4)  ) Recent Results (from the past 240 hour(s))  Culture, blood (Routine X 2) w Reflex to ID Panel     Status: None   Collection Time: 06/11/19 12:05 PM   Specimen: BLOOD  Result Value Ref Range Status   Specimen Description   Final    BLOOD RIGHT ANTECUBITAL Performed at [facility], [address] [place], [place] [address]    Special Requests   Final    BOTTLES DRAWN AEROBIC AND ANAEROBIC Blood Culture results may not be optimal due to an inadequate volume of blood received in culture bottles Performed at [facility], [address] [place], [place] [address]    Culture   Final    NO GROWTH 5 DAYS Performed at [facility], [place] [address] [place], [place] [address]    Report Status 06/16/2019 FINAL  Final  Culture, blood (Routine X 2) w Reflex to ID Panel     Status: None   Collection Time: 06/11/19 12:55 PM   Specimen: BLOOD  Result Value Ref Range Status   Specimen Description   Final    BLOOD BLOOD RIGHT FOREARM Performed at [facility], [address] [place], [place] [address]    Special Requests   Final    BOTTLES DRAWN AEROBIC AND ANAEROBIC Blood Culture results may not be optimal  due to an inadequate volume of blood received in culture bottles Performed at [facility], [address] [place], [place] [address]    Culture   Final    NO GROWTH 5 DAYS Performed at [facility], [place] [address], [place], [place] [address]    Report Status 06/16/2019 FINAL  Final  Urine culture     Status: Abnormal   Collection Time: 06/11/19  3:26 PM   Specimen: In/Out Cath Urine  Result Value Ref Range Status   Specimen Description   Final    IN/OUT CATH URINE Performed at [facility], [address] [place], [place] [address]    Special Requests   Final    NONE Performed at [facility], [address] [place], [place] [address]    Culture 10,000 COLONIES/mL YEAST (A)  Final   Report Status 06/13/2019 FINAL  Final  MRSA PCR Screening     Status: None   Collection Time: 06/11/19  9:52 PM   Specimen: Nasal Mucosa; Nasopharyngeal  Result Value Ref Range Status   MRSA by PCR NEGATIVE NEGATIVE Final    Comment:        The GeneXpert MRSA Assay (FDA approved for NASAL specimens only), is one component of a comprehensive MRSA colonization surveillance program. It is not intended to diagnose MRSA infection nor to guide or monitor treatment for MRSA infections. Performed at [facility], [place] [address] [place], [place] [address]       Studies: No results found.  Scheduled Meds: . allopurinol  100 mg Oral QHS  . atorvastatin  40 mg Oral QHS  . cefdinir  300 mg Oral Q12H  . Chlorhexidine Gluconate Cloth  6 each Topical Daily  . digoxin  0.125 mg Oral Daily  . DULoxetine  60 mg Oral Daily  . Gerhardt's butt cream   Topical QID  . insulin aspart  0-9 Units Subcutaneous Q4H  . levothyroxine  125 mcg Oral QAC breakfast  . magic mouthwash  15 mL Oral QID  . metroNIDAZOLE  500 mg Oral Q8H  . nystatin   Topical BID  . pantoprazole  40 mg Oral BID  . raloxifene  60 mg Oral QHS  . rivaroxaban  20 mg Oral Daily   . sodium chloride flush  10-40 mL Intracatheter Q12H  . sodium chloride flush  10-40 mL Intracatheter Q12H  . sodium chloride flush  3 mL Intravenous Q12H  . sodium chloride flush  3 mL Intravenous Q12H  . spironolactone  12.5 mg Oral Daily    Continuous Infusions: . sodium chloride Stopped (06/19/19 1306)  . sodium chloride 20 mL/hr at 06/19/19 1400  . sodium chloride    . amiodarone 60 mg/hr (06/19/19 1400)     LOS: 8 days   Total time spent: 30 minutes.

## 2019-06-19 NOTE — Anesthesia Postprocedure Evaluation (Signed)
Anesthesia Post Note  Patient: Melinda Mckinney  Procedure(s) Performed: TRANSESOPHAGEAL ECHOCARDIOGRAM (TEE) (N/A ) CARDIOVERSION (N/A )     Patient location during evaluation: PACU Anesthesia Type: MAC Level of consciousness: awake and alert Pain management: pain level controlled Vital Signs Assessment: post-procedure vital signs reviewed and stable Respiratory status: spontaneous breathing, nonlabored ventilation, respiratory function stable and patient connected to nasal cannula oxygen Cardiovascular status: stable and blood pressure returned to baseline Postop Assessment: no apparent nausea or vomiting Anesthetic complications: no    Last Vitals:  Vitals:   06/19/19 1830 06/19/19 1900  BP:  103/72  Pulse: 90 91  Resp: 19 18  Temp:    SpO2: 99% 98%    Last Pain:  Vitals:   06/19/19 1637  TempSrc:   PainSc: Tyler Deis

## 2019-06-19 NOTE — Transfer of Care (Signed)
Immediate Anesthesia Transfer of Care Note  Patient: Melinda Mckinney  Procedure(s) Performed: TRANSESOPHAGEAL ECHOCARDIOGRAM (TEE) (N/A ) CARDIOVERSION (N/A )  Patient Location: ICU  Anesthesia Type:MAC  Level of Consciousness: drowsy and patient cooperative  Airway & Oxygen Therapy: Patient Spontanous Breathing and Patient connected to nasal cannula oxygen  Post-op Assessment: Report given to RN, Post -op Vital signs reviewed and stable and Patient moving all extremities X 4  Post vital signs: Reviewed and stable  Last Vitals:  Vitals Value Taken Time  BP 110/85 06/19/19 1637  Temp    Pulse 96 06/19/19 1640  Resp 21 06/19/19 1640  SpO2 96 % 06/19/19 1640  Vitals shown include unvalidated device data.  Last Pain:  Vitals:   06/19/19 1637  TempSrc:   PainSc: Asleep      Patients Stated Pain Goal: 0 (15/61/53 7943)  Complications: No apparent anesthesia complications

## 2019-06-19 NOTE — H&P (View-Only) (Signed)
Advanced Heart Failure Rounding Note  PCP-Cardiologist: Skeet Latch, MD   Subjective:    Echo 11/1 w/ severely reduced EF, 25-30%. LHC 11/2 w/ normal cors. RHC w/ high filling pressures and low CO.   Started on milrinone 11/2 for low CO. CI 1.6 on RHC. Initial Co-ox was 60%. Milrinone weaned down yesterday to 0.125. Repeat Co-ox 67% today.  Diuresed 3L yesterday w/ Lasix gtt. However lasix gtt stopped overnight due to hypotension. Repeat CVP 8-9 this am.   Wt continues to trend down, 236>>234 lb. No dyspnea at rest. SCr 0.78. K 4.0.   BP 107/59. MAP 71.   Remains in afib w/ RVR in the 130s-160s. On IV amio 60 mg/hr. Fairly asymptomatic w/ afib.  TFTs abnormal but values contradict. Free T4 is elevated at 1.89 but TSH is also elevated at 8.  IM managing diverticulitis w/ abx. Continues w/ mild abd pain. Afebrile. WBC ct normal.    Objective:   Weight Range: 106.3 kg Body mass index is 39 kg/m.   Vital Signs:   Temp:  [98.1 F (36.7 C)-98.9 F (37.2 C)] 98.1 F (36.7 C) (11/04 0747) Pulse Rate:  [25-152] 78 (11/04 0700) Resp:  [15-25] 18 (11/04 0700) BP: (77-122)/(58-100) 90/74 (11/04 0700) SpO2:  [85 %-100 %] 96 % (11/04 0700) Weight:  [106.3 kg] 106.3 kg (11/04 0400) Last BM Date: 06/17/19  Weight change: Filed Weights   06/16/19 0500 06/18/19 0500 06/19/19 0400  Weight: 110 kg 107.4 kg 106.3 kg    Intake/Output:   Intake/Output Summary (Last 24 hours) at 06/19/2019 0801 Last data filed at 06/19/2019 0700 Gross per 24 hour  Intake 1382.21 ml  Output 3125 ml  Net -1742.79 ml      Physical Exam   CVP 9 PHYSICAL EXAM: General:  Well appearing obese sp[place] female. No respiratory difficulty HEENT: normal Neck: supple. no JVD. Carotids 2+ bilat; no bruits. No lymphadenopathy or thyromegaly appreciated. Cor: PMI nondisplaced. irregularly irregular rhythm, tachy rate. No rubs, gallops or murmurs. Lungs: clear Abdomen: soft, mildly tentender,  nondistended. No hepatosplenomegaly. No bruits or masses. Good bowel sounds. Extremities: no cyanosis, clubbing, rash, edema Neuro: alert & oriented x 3, cranial nerves grossly intact. moves all 4 extremities w/o difficulty. Affect pleasant.   Telemetry   afib w/ RVR, fluctuating in the 130s- 160s. 3 beat run of NSVT on tele  EKG    N/A  Labs    CBC Recent Labs    06/18/19 0223 06/18/19 2239 06/19/19 0241  WBC 7.2  --  7.6  HGB 10.5* 12.2 11.2*  HCT 34.8* 36.0 36.7  MCV 91.1  --  89.1  PLT 284  --  169   Basic Metabolic Panel Recent Labs    06/18/19 2237 06/18/19 2239 06/19/19 0241  NA 134* 133* 134*  K 3.4* 3.5 4.0  CL 94*  --  92*  CO2 29  --  28  GLUCOSE 270*  --  273*  BUN 5*  --  6*  CREATININE 0.81  --  0.78  CALCIUM 8.2*  --  8.3*  MG 2.2  --  2.0   Liver Function Tests No results for input(s): AST, ALT, ALKPHOS, BILITOT, PROT, ALBUMIN in the last 72 hours. No results for input(s): LIPASE, AMYLASE in the last 72 hours. Cardiac Enzymes No results for input(s): CKTOTAL, CKMB, CKMBINDEX, TROPONINI in the last 72 hours.  BNP: BNP (last 3 results) Recent Labs    05/26/19 0720 05/27/19 0245 05/28/19 0630  BNP 604.0* 330.7* 345.9*    ProBNP (last 3 results) No results for input(s): PROBNP in the last 8760 hours.   D-Dimer No results for input(s): DDIMER in the last 72 hours. Hemoglobin A1C No results for input(s): HGBA1C in the last 72 hours. Fasting Lipid Panel No results for input(s): CHOL, HDL, LDLCALC, TRIG, CHOLHDL, LDLDIRECT in the last 72 hours. Thyroid Function Tests Recent Labs    06/18/19 0258 06/18/19 1200  TSH 8.342*  --   T3FREE  --  2.3    Other results:   Imaging    No results found.   Medications:     Scheduled Medications: . allopurinol  100 mg Oral QHS  . atorvastatin  40 mg Oral QHS  . cefdinir  300 mg Oral Q12H  . Chlorhexidine Gluconate Cloth  6 each Topical Daily  . digoxin  0.125 mg Oral Daily  .  DULoxetine  60 mg Oral Daily  . Gerhardt's butt cream   Topical QID  . insulin aspart  0-9 Units Subcutaneous Q4H  . levothyroxine  125 mcg Oral QAC breakfast  . magic mouthwash  15 mL Oral QID  . metroNIDAZOLE  500 mg Oral Q8H  . nystatin   Topical BID  . pantoprazole  40 mg Oral BID  . raloxifene  60 mg Oral QHS  . rivaroxaban  20 mg Oral Daily  . sodium chloride flush  10-40 mL Intracatheter Q12H  . sodium chloride flush  10-40 mL Intracatheter Q12H  . sodium chloride flush  3 mL Intravenous Q12H  . spironolactone  12.5 mg Oral Daily    Infusions: . sodium chloride 10 mL/hr at 06/19/19 0700  . amiodarone 60 mg/hr (06/19/19 0700)  . furosemide (LASIX) infusion Stopped (06/19/19 0326)  . milrinone 0.125 mcg/kg/min (06/19/19 0700)    PRN Medications: sodium chloride, acetaminophen, cyclobenzaprine, HYDROmorphone (DILAUDID) injection, morphine injection, ondansetron (ZOFRAN) IV, ondansetron **OR** [DISCONTINUED] ondansetron (ZOFRAN) IV, sodium chloride flush, sodium chloride flush, sodium chloride flush, zolpidem    Patient Profile   63 y/o female w/ h/o CAD s/p PCI in 2010, chronic combined systolic and diastolic HF (EF 45-50% in 2018), atrial fibrillation on chronic a/c w/ Xarelto, HTN, prediabetes, hypothyroidism and diagnosed w/ COVID 19 in Sep 2020 and was treated at [facility]. COVID infection c/b PNA and gastroenteritis. Admitted to [facility] 10/27 for diverticulitis and afib w/ RVR and found to have worsening systolic function w/ EF now at 25-30%. Subsequent LHC showed widely patent coronaries. RHC c/w severely elevated filling pressures and low CO.   Assessment/Plan   1. Acute systolic CHF:  Nonischemic cardiomyopathy.  Echo in 2018 with EF 45-50%.  Echo this admission with EF 25-30%, diffuse hypokinesis, mildly decreased RV systolic function, moderate MR. LHC/RHC this admission with no significant coronary disease, markedly elevated filling pressures and low cardiac  output (CI 1.6).  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 ESR elevated at 38. SCr 1.0 (down from 2.3 3 wks ago). TSH elevated at 8.34. Free T4 also elevated at 1.89.  - has PICC. Initial Co-ox 60%.  - Now on milrinone 0.125.  She is on amiodarone gtt for rate control.  - Lasix gtt discontinued overnight due to hypotension - CVP 9 this AM. MAP 71. Co-ox 67% on 0.125 of milrinone  - BP did not tolerate Entresto. Losartan now on hold due to low BP - Continue Spironolactone 12.5 daily.  - Continue digoxin   0.125 daily.  - She will need TEE-guided DCCV for rapid afib.  - Consider stopping milrinone today and repeat co-ox this afternoon. ? If milrinone exacerbating afib.  - Can resume scheduled IV lasix. Give a dose of 80 mg x1 . Follow CVPs - Eventually, she will need cardiac MRI. ? 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 ESR elevated at 38. SCr 1.0 (down from 2.3 3 wks ago). Also w/ difficult to control atrial arrhthymia.  2. Atrial fibrillation with RVR: Persistent atrial fibrillation, it appears, at least since 9/20 when she was admitted with COVID-19. Prior, she had paroxysmal atrial fibrillation and was maintained on sotalol. She is now off sotalol.  - remains in afib w/ RVR in the 130s-160s. MAP 71.  - On amiodarone 60 mg/hr for rate control. ? rebolus - Continue Xarelto 20 mg daily   - Plan TEE-guided DCCV, hopefully tomorrow if schedule allows.   - Eventually would benefit from atrial fibrillation ablation (CASTLE-HF study).  - supp K. Check Mg. Keep K >4.0 and Mg >2.0 - given body habitus, needs outpatient sleep study if no previous evaluation  3. Mitral regurgitation: Functional MR, moderate.  4. Diverticulitis: Afebrile, WBCs not elevated.  - Continue cefdinir and Flagyl per IM.  5. Hypothyroidism: on levothyroxine. Conflicting thyroid levels. TSH elevated at 8 but Free T4 also elevated at 1.89.   6. Hypokalemia: resolved w/ supplementation. K 4.0 this am.  7. Hyperglycemia/Prediabetes: CBGs improving, in the 200s this am. Hgb A1c 04/2019 was 6.1  -management per IM    Length of Stay: [address], PA-C  06/19/2019, 8:01 AM  Advanced Heart Failure Team Pager [phone] (M-F; 7a - 4p)  Please contact [facility] for night-coverage after hours (4p -7a ) and weekends on [email or web address]  Patient seen with PA, agree with the above note.   Patient in atrial fibrillation with RVR this morning, HR 130s-150s, uncontrolled.  BP is stable.  Co-ox is 67% with CVP 8-9.  Lasix was held, she remains on milrinone 0.125 and amiodarone 60 mg/hr.  She is getting her 2nd dose of Xarelto this morning.   She feels better, no dyspnea.   General: NAD Neck: JVP 8 cm, no thyromegaly or thyroid nodule.  Lungs: Clear to auscultation bilaterally with normal respiratory effort. CV: Nondisplaced PMI.  Heart tachy, irregular S1/S2, no S3/S4, no murmur.  No peripheral edema.   Abdomen: Soft, nontender, no hepatosplenomegaly, no distention.  Skin: Intact without lesions or rashes.  Neurologic: Alert and oriented x 3.  Psych: Normal affect. Extremities: No clubbing or cyanosis.  HEENT: Normal.   She is in atrial fibrillation with RVR this morning, HR in 130s-150s.   - Stop milrinone gtt with co-ox 67%.  - Continue amiodarone gtt 60 mg/hr, will give bolus.  - If HR does not slow, she will need TEE-guided DCCV today.  However, ideally would get 3 doses of Xarelto in her which would be tomorrow.  Will check her later this morning to see if she slows enough to wait, otherwise will need today. Keep NPO for now.  - Continue digoxin.   CVP 8-9 with co-ox 67%.  Stop milrinone today.  Now off Lasix gtt, will give 1 dose Lasix 80 mg IV this morning.  Continue current spironolacteon and digoxin.   CRITICAL CARE Performed by: [name]  Total critical care time: 40 minutes  Critical care time was  exclusive of separately billable procedures and treating other patients.  Critical care was necessary to treat or  prevent imminent or life-threatening deterioration.  Critical care was time spent personally by me on the following activities: development of treatment plan with patient and/or surrogate as well as nursing, discussions with consultants, evaluation of patient's response to treatment, examination of patient, obtaining history from patient or surrogate, ordering and performing treatments and interventions, ordering and review of laboratory studies, ordering and review of radiographic studies, pulse oximetry and re-evaluation of patient's condition.  Dalton McLean 06/19/2019 8:53 AM   

## 2019-06-20 ENCOUNTER — Encounter (HOSPITAL_COMMUNITY): Payer: Self-pay | Admitting: Cardiology

## 2019-06-20 ENCOUNTER — Inpatient Hospital Stay (HOSPITAL_COMMUNITY): Payer: Medicare Other

## 2019-06-20 ENCOUNTER — Ambulatory Visit (HOSPITAL_COMMUNITY): Payer: Medicare Other | Admitting: Nurse Practitioner

## 2019-06-20 DIAGNOSIS — I5043 Acute on chronic combined systolic (congestive) and diastolic (congestive) heart failure: Secondary | ICD-10-CM

## 2019-06-20 LAB — CBC
HCT: 35.6 % — ABNORMAL LOW (ref 36.0–46.0)
Hemoglobin: 10.8 g/dL — ABNORMAL LOW (ref 12.0–15.0)
MCH: 27.3 pg (ref 26.0–34.0)
MCHC: 30.3 g/dL (ref 30.0–36.0)
MCV: 90.1 fL (ref 80.0–100.0)
Platelets: 285 10*3/uL (ref 150–400)
RBC: 3.95 MIL/uL (ref 3.87–5.11)
RDW: 15.9 % — ABNORMAL HIGH (ref 11.5–15.5)
WBC: 6.4 10*3/uL (ref 4.0–10.5)
nRBC: 0 % (ref 0.0–0.2)

## 2019-06-20 LAB — BASIC METABOLIC PANEL
Anion gap: 12 (ref 5–15)
BUN: 7 mg/dL — ABNORMAL LOW (ref 8–23)
CO2: 27 mmol/L (ref 22–32)
Calcium: 8.5 mg/dL — ABNORMAL LOW (ref 8.9–10.3)
Chloride: 97 mmol/L — ABNORMAL LOW (ref 98–111)
Creatinine, Ser: 0.78 mg/dL (ref 0.44–1.00)
GFR calc Af Amer: >60 mL/min (ref >60)
GFR calc non Af Amer: >60 mL/min (ref >60)
Glucose, Bld: 177 mg/dL — ABNORMAL HIGH (ref 70–99)
Potassium: 3.9 mmol/L (ref 3.5–5.1)
Sodium: 136 mmol/L (ref 135–145)

## 2019-06-20 LAB — COOXEMETRY PANEL
Carboxyhemoglobin: 2 % — ABNORMAL HIGH (ref 0.5–1.5)
Methemoglobin: 1.1 % (ref 0.0–1.5)
O2 Saturation: 76.3 %
Total hemoglobin: 11.2 g/dL — ABNORMAL LOW (ref 12.0–16.0)

## 2019-06-20 LAB — GLUCOSE, CAPILLARY
Glucose-Capillary: 114 mg/dL — ABNORMAL HIGH (ref 70–99)
Glucose-Capillary: 131 mg/dL — ABNORMAL HIGH (ref 70–99)
Glucose-Capillary: 141 mg/dL — ABNORMAL HIGH (ref 70–99)
Glucose-Capillary: 153 mg/dL — ABNORMAL HIGH (ref 70–99)
Glucose-Capillary: 183 mg/dL — ABNORMAL HIGH (ref 70–99)

## 2019-06-20 LAB — T3, FREE: T3, Free: 2.6 pg/mL (ref 2.0–4.4)

## 2019-06-20 LAB — MAGNESIUM: Magnesium: 1.9 mg/dL (ref 1.7–2.4)

## 2019-06-20 MED ORDER — AMIODARONE IV BOLUS ONLY 150 MG/100ML
150.0000 mg | Freq: Once | INTRAVENOUS | Status: AC
  Administered 2019-06-20: 150 mg via INTRAVENOUS

## 2019-06-20 MED ORDER — LOSARTAN POTASSIUM 25 MG PO TABS: 12.5000 mg | ORAL_TABLET | Freq: Every day | ORAL | Status: DC

## 2019-06-20 MED ORDER — POTASSIUM CHLORIDE CRYS ER 20 MEQ PO TBCR
40.0000 meq | EXTENDED_RELEASE_TABLET | Freq: Once | ORAL | Status: AC
  Administered 2019-06-20: 40 meq via ORAL
  Filled 2019-06-20: qty 2

## 2019-06-20 MED ORDER — FUROSEMIDE 10 MG/ML IJ SOLN
80.0000 mg | Freq: Once | INTRAMUSCULAR | Status: AC
  Administered 2019-06-20: 80 mg via INTRAVENOUS
  Filled 2019-06-20: qty 8

## 2019-06-20 MED ORDER — TORSEMIDE 20 MG PO TABS
40.0000 mg | ORAL_TABLET | Freq: Every day | ORAL | Status: DC
  Administered 2019-06-21 – 2019-06-26 (×6): 40 mg via ORAL
  Filled 2019-06-20 (×6): qty 2

## 2019-06-20 MED ORDER — MAGNESIUM SULFATE IN D5W 1-5 GM/100ML-% IV SOLN
1.0000 g | Freq: Once | INTRAVENOUS | Status: AC
  Administered 2019-06-20: 1 g via INTRAVENOUS
  Filled 2019-06-20: qty 100

## 2019-06-20 MED ORDER — LOSARTAN POTASSIUM 25 MG PO TABS
12.5000 mg | ORAL_TABLET | Freq: Two times a day (BID) | ORAL | Status: DC
  Administered 2019-06-20 – 2019-06-25 (×11): 12.5 mg via ORAL
  Filled 2019-06-20 (×11): qty 1

## 2019-06-20 MED ORDER — GADOBUTROL 1 MMOL/ML IV SOLN
12.0000 mL | Freq: Once | INTRAVENOUS | Status: AC | PRN
  Administered 2019-06-20: 12 mL via INTRAVENOUS

## 2019-06-20 NOTE — Progress Notes (Signed)
PROGRESS NOTE  Melinda Mckinney [id number] DOB: October 11, 1955 DOA: 06/11/2019 PCP: Colon Branch, MD  HPI/Recap of past 24 hours: See Melinda Mckinney is a 63 y.o. female with medical history significant of DM2, A.Fib on xarelto, CAD s/p stent.  Patient originally diagnosed with COVID19 on 9/15.  Admitted with gastroenteritis due to COVID 10/9-10/13.  Developed worsening abd pain later in the month, CT abd/pelvis on 10/22 showed new acute diverticulitis not present on CT 10/9. She was put on cipro/flagyl.  She continued to have left lower quadrant abdominal pain so came back to the emergency room. In the emergency room, repeat CT scan of the abdomen pelvis showed improvement of diverticulitis.  Blood pressure was soft.  Lactate was 2.7.  Started on antibiotics.  Found to be on A. fib with RVR and treated with Cardizem drip. Echocardiogram showed severely reduced ejection fraction to 25%.  Underwent cardiac catheterization with normal coronaries.  Patient was started on milrinone, amiodarone and Lasix and admitted to progressive care unit.  06/19/2019: Remains persistent A. fib with RVR, underwent TEE and cardioversion with successful conversion to sinus rhythm and remains on amiodarone drip.  EF is 15%.  Subjective: Patient seen and examined.  No overnight events.  Patient herself denies any complaints.  She was ready to walk with therapies.  Remains in normal sinus rhythm.  Blood pressure fluctuates, somehow better today.   Assessment/Plan: Principal Problem:   Sepsis ([place]) Active Problems:   Essential hypertension   Coronary artery disease involving native coronary artery of native heart with angina pectoris (HCC)   Atrial fibrillation with rapid ventricular response (HCC)   Chronic systolic CHF (congestive heart failure) ([place])   COVID-19   Diverticulitis  Paroxysmal atrial fibrillation with RVR Patient managed on diltiazem drip initially.  Persistent A. fib, bolus and ongoing amiodarone infusion.   On digoxin.   Status post TEE cardioversion.  On Xarelto.  Acute on chronic systolic CHF, nonischemic cardiomyopathy. Patient with reduced EF of less than 20%.  Previous EF of 45-50% from 2018. Unknown etiology. Recent Covid infection and currently in atrial fibrillation.  Followed by heart failure team. 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  MRI with normal myocardium. On IV Lasix with good response and diuresing.  Getting intermittent IV diuresis.  Started on torsemide starting tomorrow. Milrinone stopped. Added Aldactone and losartan.  Acute sigmoid diverticulitis Failed outpatient treatment with ciprofloxacin/flagyl.  Symptoms improved.   Already received prolonged treatment.   Finish 10 days of ceftriaxone and Flagyl and stop.    Essential hypertension/soft blood pressures Blood pressure soft.  Antihypertensives on hold.  Pre-diabetes No evidence of diabetes on chart review. Highest hemoglobin A1C is 6.1%. Recommend diet modification.  Hypothyroidism Recent TSH of 1.6 -Continue Synthroid  Recent COVID-19 pneumonia Symptoms improved. Still with some dyspnea on exertion but much improved from weeks prior.  On 2 L of oxygen as needed at baseline. She will need sleep studies after discharge.   DVT prophylaxis: Xarelto Code Status:  Full Code Family Communication:  None Disposition Plan:  Pending clinical improvement.  Remains in progressive care unit.  Anticipate discharge home with home health care after clinical improvement.  Consultants:   Cardiology  Procedures:   11/1: Transthoracic Echocardiogram IMPRESSIONS   1. Left ventricular ejection fraction, by visual estimation, is 25 to 30%. The left ventricle has severely decreased function. There is no left ventricular hypertrophy. 2. Left ventricular diastolic parameters are indeterminate. 3. The left ventricle demonstrates global hypokinesis. 4. Global right ventricle has mildly reduced  systolic  function.The right ventricular size is normal. No increase in right ventricular wall thickness. 5. Left atrial size was moderately dilated. 6. Right atrial size was mildly dilated. 7. The mitral valve is normal in structure. Moderate mitral valve regurgitation. No evidence of mitral stenosis. 8. The tricuspid valve is normal in structure. Tricuspid valve regurgitation moderate. 9. The aortic valve is normal in structure. Aortic valve regurgitation is mild. No evidence of aortic valve sclerosis or stenosis. 10. The pulmonic valve was normal in structure. Pulmonic valve regurgitation is mild. 11. Aortic dilatation noted. 12. There is mild dilatation of the ascending aorta measuring 39 mm. 13. Mildly elevated pulmonary artery systolic pressure. 14. The inferior vena cava is normal in size with greater than 50% respiratory variability, suggesting right atrial pressure of 3 mmHg.  FINDINGS Left Ventricle: Left ventricular ejection fraction, by visual estimation, is 25 to 30%. The left ventricle has severely decreased function. The left ventricle demonstrates global hypokinesis. There is no left ventricular hypertrophy. Left ventricular  diastolic parameters are indeterminate. Normal left atrial pressure.  Right Ventricle: The right ventricular size is normal. No increase in right ventricular wall thickness. Global RV systolic function is has mildly reduced systolic function. The tricuspid regurgitant velocity is 2.46 m/s, and with an assumed right atrial  pressure of 8 mmHg, the estimated right ventricular systolic pressure is mildly elevated at 32.2 mmHg.  Left Atrium: Left atrial size was moderately dilated.  Right Atrium: Right atrial size was mildly dilated  Pericardium: There is no evidence of pericardial effusion.  Mitral Valve: The mitral valve is normal in structure. No evidence of mitral valve stenosis by observation. Moderate mitral valve regurgitation.  Tricuspid Valve:  The tricuspid valve is normal in structure. Tricuspid valve regurgitation moderate.  Aortic Valve: The aortic valve is normal in structure. Aortic valve regurgitation is mild. Aortic regurgitation PHT measures 835 msec. The aortic valve is structurally normal, with no evidence of sclerosis or stenosis.  Pulmonic Valve: The pulmonic valve was normal in structure. Pulmonic valve regurgitation is mild.  Aorta: Aortic dilatation noted. There is mild dilatation of the ascending aorta measuring 39 mm.  Venous: The inferior vena cava is normal in size with greater than 50% respiratory variability, suggesting right atrial pressure of 3 mmHg.  IAS/Shunts: No atrial level shunt detected by color flow Doppler. No ventricular septal defect is seen or detected. There is no evidence of an atrial septal defect.  Antimicrobials:  Ceftriaxone (10/27>>  Flagyl (10/27>>    Objective: Vitals:   06/20/19 0800 06/20/19 0900 06/20/19 1000 06/20/19 1215  BP: 110/78 115/76 115/83 117/67  Pulse: 94 92 92 97  Resp: 19 16 (!) 21 20  Temp:      TempSrc:      SpO2: 94% 95% 94% 95%  Weight:      Height:        Intake/Output Summary (Last 24 hours) at 06/20/2019 1322 Last data filed at 06/20/2019 1200 Gross per 24 hour  Intake 1737.08 ml  Output 1470 ml  Net 267.08 ml   Filed Weights   06/18/19 0500 06/19/19 0400 06/20/19 0500  Weight: 107.4 kg 106.3 kg 107.2 kg    Exam:  Physical Exam  Constitutional: She is oriented to person, place, and time and well-developed, well-nourished, and in no distress. No distress.  HENT:  Head: Normocephalic and atraumatic.  Neck: Normal range of motion. Neck supple. No thyromegaly present.  Cardiovascular: Normal rate and regular rhythm.  No murmur heard. Pulmonary/Chest: Breath  sounds normal.  Abdominal: Bowel sounds are normal. She exhibits no distension.  Neurological: She is alert and oriented to person, place, and time.  Skin: Skin is warm and dry.   Psychiatric: Mood and affect normal.    Data Reviewed: CBC: Recent Labs  Lab 06/16/19 1613 06/17/19 0751  06/18/19 0223 06/18/19 2239 06/19/19 0241 06/19/19 0348 06/20/19 0445  WBC 5.7 8.4  --  7.2  --  7.6  --  6.4  HGB 10.7* 11.5*   < > 10.5* 12.2 11.2* 12.2 10.8*  HCT 35.9* 38.7   < > 34.8* 36.0 36.7 36.0 35.6*  MCV 93.2 93.5  --  91.1  --  89.1  --  90.1  PLT 231 297  --  284  --  322  --  285   < > = values in this interval not displayed.   Basic Metabolic Panel: Recent Labs  Lab 06/18/19 0223 06/18/19 1200 06/18/19 2237 06/18/19 2239 06/19/19 0241 06/19/19 0348 06/20/19 0445  NA 134* 135 134* 133* 134* 136 136  K 2.7* 3.4* 3.4* 3.5 4.0 4.0 3.9  CL 96* 91* 94*  --  92*  --  97*  CO2 24 27 29   --  28  --  27  GLUCOSE 401* 337* 270*  --  273*  --  177*  BUN 6* 6* 5*  --  6*  --  7*  CREATININE 0.76 0.92 0.81  --  0.78  --  0.78  CALCIUM 8.2* 8.5* 8.2*  --  8.3*  --  8.5*  MG  --  1.2* 2.2  --  2.0  --  1.9   GFR: Estimated Creatinine Clearance: 85.3 mL/min (by C-G formula based on SCr of 0.78 mg/dL). Liver Function Tests: No results for input(s): AST, ALT, ALKPHOS, BILITOT, PROT, ALBUMIN in the last 168 hours. No results for input(s): LIPASE, AMYLASE in the last 168 hours. No results for input(s): AMMONIA in the last 168 hours. Coagulation Profile: No results for input(s): INR, PROTIME in the last 168 hours. Cardiac Enzymes: No results for input(s): CKTOTAL, CKMB, CKMBINDEX, TROPONINI in the last 168 hours. BNP (last 3 results) No results for input(s): PROBNP in the last 8760 hours. HbA1C: No results for input(s): HGBA1C in the last 72 hours. CBG: Recent Labs  Lab 06/19/19 1211 06/19/19 1932 06/19/19 2344 06/20/19 0345 06/20/19 0741  GLUCAP 119* 114* 101* 131* 141*   Lipid Profile: No results for input(s): CHOL, HDL, LDLCALC, TRIG, CHOLHDL, LDLDIRECT in the last 72 hours. Thyroid Function Tests: Recent Labs    06/18/19 0258  06/19/19 0241   TSH 8.342*  --   --   FREET4  --    < > 2.70*  T3FREE  --    < > 2.6   < > = values in this interval not displayed.   Anemia Panel: No results for input(s): VITAMINB12, FOLATE, FERRITIN, TIBC, IRON, RETICCTPCT in the last 72 hours. Urine analysis:    Component Value Date/Time   COLORURINE AMBER (A) 06/11/2019 1526   APPEARANCEUR CLEAR 06/11/2019 1526   LABSPEC >1.046 (H) 06/11/2019 1526   PHURINE 6.0 06/11/2019 1526   GLUCOSEU NEGATIVE 06/11/2019 1526   GLUCOSEU NEGATIVE 06/06/2019 1538   HGBUR SMALL (A) 06/11/2019 1526   BILIRUBINUR NEGATIVE 06/11/2019 1526   KETONESUR NEGATIVE 06/11/2019 1526   PROTEINUR 30 (A) 06/11/2019 1526   UROBILINOGEN 0.2 06/06/2019 1538   NITRITE NEGATIVE 06/11/2019 1526   LEUKOCYTESUR TRACE (A) 06/11/2019 1526   Sepsis Labs: @  LABRCNTIP(procalcitonin:4,lacticidven:4)  ) Recent Results (from the past 240 hour(s))  Culture, blood (Routine X 2) w Reflex to ID Panel     Status: None   Collection Time: 06/11/19 12:05 PM   Specimen: BLOOD  Result Value Ref Range Status   Specimen Description   Final    BLOOD RIGHT ANTECUBITAL Performed at [facility], [address] [place], [place] [address]    Special Requests   Final    BOTTLES DRAWN AEROBIC AND ANAEROBIC Blood Culture results may not be optimal due to an inadequate volume of blood received in culture bottles Performed at [facility], [address] [place], [place] [address]    Culture   Final    NO GROWTH 5 DAYS Performed at [facility], [place] [address], [place], [place] [address]    Report Status 06/16/2019 FINAL  Final  Culture, blood (Routine X 2) w Reflex to ID Panel     Status: None   Collection Time: 06/11/19 12:55 PM   Specimen: BLOOD  Result Value Ref Range Status   Specimen Description   Final    BLOOD BLOOD RIGHT FOREARM Performed at [facility], [address] [place], [place] [address]    Special Requests   Final    BOTTLES DRAWN  AEROBIC AND ANAEROBIC Blood Culture results may not be optimal due to an inadequate volume of blood received in culture bottles Performed at [facility], [address] [place], [place] [address]    Culture   Final    NO GROWTH 5 DAYS Performed at [facility], [place] [address], [place], [place] [address]    Report Status 06/16/2019 FINAL  Final  Urine culture     Status: Abnormal   Collection Time: 06/11/19  3:26 PM   Specimen: In/Out Cath Urine  Result Value Ref Range Status   Specimen Description   Final    IN/OUT CATH URINE Performed at [facility], [address] [place], [place] [address]    Special Requests   Final    NONE Performed at [facility], [address] [place], [place] [address]    Culture 10,000 COLONIES/mL YEAST (A)  Final   Report Status 06/13/2019 FINAL  Final  MRSA PCR Screening     Status: None   Collection Time: 06/11/19  9:52 PM   Specimen: Nasal Mucosa; Nasopharyngeal  Result Value Ref Range Status   MRSA by PCR NEGATIVE NEGATIVE Final    Comment:        The GeneXpert MRSA Assay (FDA approved for NASAL specimens only), is one component of a comprehensive MRSA colonization surveillance program. It is not intended to diagnose MRSA infection nor to guide or monitor treatment for MRSA infections. Performed at [facility], [place] [address] [place], [place] [address]       Studies: Mr Cardiac Morphology W Wo Contrast  Result Date: 06/20/2019 CLINICAL DATA:  Nonischemic cardiomyopathy. EXAM: CARDIAC MRI TECHNIQUE: The patient was scanned on a 1.5 Tesla GE magnet. A dedicated cardiac coil was used. Functional imaging was done using Fiesta sequences. 2,3, and 4 chamber views were done to assess for RWMA's. Modified Simpson's rule using a short axis stack was used to calculate an ejection fraction on a dedicated work Conservation officer, nature. The patient received 8 cc of Gadavist. After 10  minutes inversion recovery sequences were used  to assess for infiltration and scar tissue. FINDINGS: Limited images of the lung fields showed no gross abnormalities. Trivial pericardial effusion. Mildly dilated left ventricle with normal wall thickness. Diffuse moderate hypokinesis, EF 34%. Normal right ventricular size with moderately decreased systolic function, EF [id number]. Moderate left atrial enlargement, mild right atrial enlargement. Trileaflet aortic valve with no stenosis, mild regurgitation. Mild-moderate mitral regurgitation visually. On delayed enhancement imaging, there was no myocardial late gadolinium enhancement (LGE) noted. Measurements: LVEDV 172 mL LVSV 59 mL LVEF 34% RVEDV 113 mL RVSV 33 mL RVEF 30% IMPRESSION: 1.  Mildly dilated LV with diffuse moderate hypokinesis, EF 34%. 2. Normal RV size with moderately decreased systolic function, EF [id number]. 3. No myocardial LGE, so no definitive evidence for prior MI, infiltrative disease, or myocarditis. Dalton Mclean Electronically Signed   By: [name] M.D.   On: 06/20/2019 13:07    Scheduled Meds: . allopurinol  100 mg Oral QHS  . atorvastatin  40 mg Oral QHS  . cefdinir  300 mg Oral Q12H  . Chlorhexidine Gluconate Cloth  6 each Topical Daily  . digoxin  0.125 mg Oral Daily  . DULoxetine  60 mg Oral Daily  . Gerhardt's butt cream   Topical QID  . insulin aspart  0-9 Units Subcutaneous Q4H  . levothyroxine  125 mcg Oral QAC breakfast  . losartan  12.5 mg Oral BID  . magic mouthwash  15 mL Oral QID  . metroNIDAZOLE  500 mg Oral Q8H  . nystatin   Topical BID  . pantoprazole  40 mg Oral BID  . raloxifene  60 mg Oral QHS  . rivaroxaban  20 mg Oral Daily  . sodium chloride flush  10-40 mL Intracatheter Q12H  . sodium chloride flush  10-40 mL Intracatheter Q12H  . spironolactone  12.5 mg Oral Daily  . [START ON 06/21/2019] torsemide  40 mg Oral Daily    Continuous Infusions: . sodium chloride Stopped (06/19/19 1306)  . sodium chloride  20 mL/hr at 06/20/19 1200  . amiodarone 60 mg/hr (06/20/19 1200)     LOS: 9 days   Total time spent: 30 minutes.

## 2019-06-20 NOTE — Progress Notes (Signed)
Physical Therapy Treatment Patient Details Name: Melinda Mckinney MRN: [id number] DOB: 1956/05/16 Today's Date: 06/20/2019    History of Present Illness 63 y.o. female with a history of atrial fibrillation on Xarelto, Covid 9/20, CAD s/p stent, hypertension, systolic heart failure. Patient presented secondary to abdominal pain and found to have diverticulitis, failing outpatient treatment. Also  with atrial fibrillation with RVR. Pt s/p cardiac cath 06/17/2019. Pt underwent cardioversion on 11/4.    PT Comments    Pt tolerated treatment well, reporting dizziness upon initial attempt at standing, but reporting resolution of dizziness after sitting and then standing again. Pt HR remains stable from 90-108 during session, rhythm reading Afib upon completion of walk but in sinu rhythm during ambulation. Pt requires UE support of IV pole or device for ambulation due to balance deficits, and will benefit from continued acute PT services to improve balance and endurance.  Follow Up Recommendations  Home health PT;Supervision for mobility/OOB     Equipment Recommendations  None recommended by PT    Recommendations for Other Services       Precautions / Restrictions Precautions Precautions: Fall Precaution Comments: recent falls Restrictions Weight Bearing Restrictions: No    Mobility  Bed Mobility Overal bed mobility: Needs Assistance Bed Mobility: Supine to Sit     Supine to sit: Supervision        Transfers Overall transfer level: Needs assistance Equipment used: None Transfers: Sit to/from Stand;Stand Pivot Transfers Sit to Stand: Supervision Stand pivot transfers: Supervision          Ambulation/Gait Ambulation/Gait assistance: Min guard Gait Distance (Feet): 80 Feet Assistive device: IV Pole Gait Pattern/deviations: Step-to pattern Gait velocity: decreased Gait velocity interpretation: 1.31 - 2.62 ft/sec, indicative of limited community ambulator General Gait  Details: shortened step to gait with reduced gait speed   Stairs             Wheelchair Mobility    Modified Rankin (Stroke Patients Only)       Balance Overall balance assessment: Needs assistance Sitting-balance support: Single extremity supported;Feet supported Sitting balance-Leahy Scale: Good Sitting balance - Comments: supervision   Standing balance support: Single extremity supported Standing balance-Leahy Scale: Good Standing balance comment: close supervision with unilateral UE support of IV pole                            Cognition Arousal/Alertness: Awake/alert Behavior During Therapy: WFL for tasks assessed/performed Overall Cognitive Status: Within Functional Limits for tasks assessed                                        Exercises      General Comments General comments (skin integrity, edema, etc.): Pt with HR ranging from 90-108 during session, PT does alert RN that telemetry reporting pt in AFIB at end of ambulation, pt asymptomatic with HR still in low 100s      Pertinent Vitals/Pain Pain Assessment: Faces Faces Pain Scale: Hurts whole lot Pain Location: groin bilaterally Pain Descriptors / Indicators: Aching Pain Intervention(s): Limited activity within patient's tolerance    Home Living                      Prior Function            PT Goals (current goals can now be found in the care plan section) Acute  Rehab PT Goals Patient Stated Goal: to feel stronger Progress towards PT goals: Progressing toward goals    Frequency    Min 3X/week      PT Plan Current plan remains appropriate    Co-evaluation              AM-PAC PT "6 Clicks" Mobility   Outcome Measure  Help needed turning from your back to your side while in a flat bed without using bedrails?: None Help needed moving from lying on your back to sitting on the side of a flat bed without using bedrails?: None Help needed  moving to and from a bed to a chair (including a wheelchair)?: A Little Help needed standing up from a chair using your arms (e.g., wheelchair or bedside chair)?: A Little Help needed to walk in hospital room?: A Little Help needed climbing 3-5 steps with a railing? : A Little 6 Click Score: 20    End of Session Equipment Utilized During Treatment: Gait belt Activity Tolerance: Patient tolerated treatment well Patient left: in chair;with call bell/phone within reach Nurse Communication: Mobility status PT Visit Diagnosis: Difficulty in walking, not elsewhere classified (R26.2);Muscle weakness (generalized) (M62.81);History of falling (Z91.81)     Time: [id number] PT Time Calculation (min) (ACUTE ONLY): 24 min  Charges:  $Gait Training: 8-22 mins $Therapeutic Activity: 8-22 mins                     Melinda Mckinney, PT, DPT Acute Rehabilitation Pager: [phone]    Melinda Mckinney 06/20/2019, 10:01 AM

## 2019-06-20 NOTE — Plan of Care (Signed)
  Problem: Clinical Measurements: Goal: Ability to maintain clinical measurements within normal limits will improve Outcome: Progressing Goal: Respiratory complications will improve Outcome: Progressing Goal: Cardiovascular complication will be avoided Outcome: Progressing   Problem: Activity: Goal: Risk for activity intolerance will decrease Outcome: Progressing   Problem: Nutrition: Goal: Adequate nutrition will be maintained Outcome: Progressing   Problem: Coping: Goal: Level of anxiety will decrease Outcome: Progressing   Problem: Elimination: Goal: Will not experience complications related to bowel motility Outcome: Progressing Goal: Will not experience complications related to urinary retention Outcome: Progressing   Problem: Pain Managment: Goal: General experience of comfort will improve Outcome: Progressing   Problem: Skin Integrity: Goal: Risk for impaired skin integrity will decrease Outcome: Progressing   Problem: Activity: Goal: Capacity to carry out activities will improve Outcome: Progressing   Problem: Cardiac: Goal: Ability to achieve and maintain adequate cardiopulmonary perfusion will improve Outcome: Progressing

## 2019-06-20 NOTE — Progress Notes (Addendum)
Advanced Heart Failure Rounding Note  PCP-Cardiologist: Skeet Latch, MD   Subjective:    Echo 11/1 w/ severely reduced EF, 25-30%. LHC 11/2 w/ normal cors. RHC w/ high filling pressures and low CO.   Started on milrinone 11/2 for low CO. CI 1.6 on RHC. Initial Co-ox was 60%. Hospital course c/b afib w/ RVR. Milrinone discontinued 11/4. On digoxin. Co-ox good off of milrinone at 76.3%.   S/p TEE/DCCV 11/4. Cardioverted 4 times, using sternal pressure after the first attempt.   Cardioverted at [facility]. Remains in NSR, HR 90s. Still on amiodarone gtt  CVP 9 this morning. SCr 0.78. MAP 80s.   IM managing diverticulitis w/ abx. Continues w/ mild abd pain. Afebrile. WBC ct normal.   TEE: EF 15%, moderate LV dilation, moderately dilated/dysfunctional RV, moderate MR.    Objective:   Weight Range: 107.2 kg Body mass index is 39.33 kg/m.   Vital Signs:   Temp:  [97.9 F (36.6 C)-98.4 F (36.9 C)] 98.1 F (36.7 C) (11/05 0400) Pulse Rate:  [25-147] 96 (11/05 0700) Resp:  [16-28] 17 (11/05 0700) BP: (76-127)/(56-115) 107/89 (11/05 0600) SpO2:  [86 %-99 %] 94 % (11/05 0700) Weight:  [107.2 kg] 107.2 kg (11/05 0500) Last BM Date: 06/17/19  Weight change: Filed Weights   06/18/19 0500 06/19/19 0400 06/20/19 0500  Weight: 107.4 kg 106.3 kg 107.2 kg    Intake/Output:   Intake/Output Summary (Last 24 hours) at 06/20/2019 0732 Last data filed at 06/20/2019 0700 Gross per 24 hour  Intake 1551.84 ml  Output 1100 ml  Net 451.84 ml      Physical Exam   CVP 9 PHYSICAL EXAM: General:  Well appearing femail. No respiratory difficulty HEENT: normal Neck: supple. JVP 8 cm. Carotids 2+ bilat; no bruits. No lymphadenopathy or thyromegaly appreciated. Cor: PMI nondisplaced. Regular rate & rhythm. No rubs, gallops or murmurs. Lungs: clear Abdomen: soft, nontender, nondistended. No hepatosplenomegaly. No bruits or masses. Good bowel sounds. Extremities: no cyanosis, clubbing,  rash, edema Neuro: alert & oriented x 3, cranial nerves grossly intact. moves all 4 extremities w/o difficulty. Affect pleasant.  Telemetry   NSR 90s   EKG    N/A  Labs    CBC Recent Labs    06/19/19 0241 06/19/19 0348 06/20/19 0445  WBC 7.6  --  6.4  HGB 11.2* 12.2 10.8*  HCT 36.7 36.0 35.6*  MCV 89.1  --  90.1  PLT 322  --  782   Basic Metabolic Panel Recent Labs    06/19/19 0241 06/19/19 0348 06/20/19 0445  NA 134* 136 136  K 4.0 4.0 3.9  CL 92*  --  97*  CO2 28  --  27  GLUCOSE 273*  --  177*  BUN 6*  --  7*  CREATININE 0.78  --  0.78  CALCIUM 8.3*  --  8.5*  MG 2.0  --  1.9   Liver Function Tests No results for input(s): AST, ALT, ALKPHOS, BILITOT, PROT, ALBUMIN in the last 72 hours. No results for input(s): LIPASE, AMYLASE in the last 72 hours. Cardiac Enzymes No results for input(s): CKTOTAL, CKMB, CKMBINDEX, TROPONINI in the last 72 hours.  BNP: BNP (last 3 results) Recent Labs    05/26/19 0720 05/27/19 0245 05/28/19 0630  BNP 604.0* 330.7* 345.9*    ProBNP (last 3 results) No results for input(s): PROBNP in the last 8760 hours.   D-Dimer No results for input(s): DDIMER in the last 72 hours. Hemoglobin A1C No  results for input(s): HGBA1C in the last 72 hours. Fasting Lipid Panel No results for input(s): CHOL, HDL, LDLCALC, TRIG, CHOLHDL, LDLDIRECT in the last 72 hours. Thyroid Function Tests Recent Labs    06/18/19 0258  06/19/19 0241  TSH 8.342*  --   --   T3FREE  --    < > 2.6   < > = values in this interval not displayed.    Other results:   Imaging    No results found.   Medications:     Scheduled Medications: . allopurinol  100 mg Oral QHS  . atorvastatin  40 mg Oral QHS  . cefdinir  300 mg Oral Q12H  . Chlorhexidine Gluconate Cloth  6 each Topical Daily  . digoxin  0.125 mg Oral Daily  . DULoxetine  60 mg Oral Daily  . Gerhardt's butt cream   Topical QID  . insulin aspart  0-9 Units Subcutaneous Q4H  .  levothyroxine  125 mcg Oral QAC breakfast  . magic mouthwash  15 mL Oral QID  . metroNIDAZOLE  500 mg Oral Q8H  . nystatin   Topical BID  . pantoprazole  40 mg Oral BID  . raloxifene  60 mg Oral QHS  . rivaroxaban  20 mg Oral Daily  . sodium chloride flush  10-40 mL Intracatheter Q12H  . sodium chloride flush  10-40 mL Intracatheter Q12H  . sodium chloride flush  3 mL Intravenous Q12H  . spironolactone  12.5 mg Oral Daily    Infusions: . sodium chloride Stopped (06/19/19 1306)  . sodium chloride 20 mL/hr at 06/20/19 0700  . amiodarone 60 mg/hr (06/20/19 0700)    PRN Medications: sodium chloride, acetaminophen, cyclobenzaprine, HYDROmorphone (DILAUDID) injection, morphine injection, ondansetron (ZOFRAN) IV, ondansetron **OR** [DISCONTINUED] ondansetron (ZOFRAN) IV, sodium chloride flush, sodium chloride flush, sodium chloride flush, zolpidem    Patient Profile   63 y/o female w/ h/o CAD s/p PCI in 2010, chronic combined systolic and diastolic HF (EF 36-14% in 2018), atrial fibrillation on chronic a/c w/ Xarelto, HTN, prediabetes, hypothyroidism and diagnosed w/ COVID 19 in Sep 2020 and was treated at [place] infection c/b PNA and gastroenteritis. Admitted to [facility] 10/27 for diverticulitis and afib w/ RVR and found to have worsening systolic function w/ EF now at 25-30%. Subsequent LHC showed widely patent coronaries. RHC c/w severely elevated filling pressures and low CO.   Assessment/Plan   1. Acute systolic CHF:  Nonischemic cardiomyopathy.  Echo in 2018 with EF 45-50%.  Echo this admission with EF 25-30%, diffuse hypokinesis, mildly decreased RV systolic function, moderate MR. LHC/RHC this admission with no significant coronary disease, markedly elevated filling pressures and low cardiac output (CI 1.6).  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 ESR elevated at 38. SCr 1.0 (down  from 2.3 3 wks ago).  -required inotropic support w/ milrinone but discontinued 11/4.  -Co-ox good off milrinone at 76%. -CVP 9 this AM. Give another dose of IV Lasix, 80 mg today. - BP did not tolerate Entresto. Losartan held 11/3 for soft BP. BP improved. 431V systolic. MAP in the 80s. Try to resume low dose losartan 12.5 mg today.  - Continue Spironolactone 12.5 daily.  - Continue digoxin 0.125 daily.  - Eventually, she will need cardiac MRI. ? 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 ESR elevated at 38. SCr 1.0 (down from 2.3 3 wks ago). Also w/ difficult to control atrial arrhthymia.  2. Atrial fibrillation with RVR: Persistent atrial fibrillation, it appears, at least since 9/20 when she was admitted with COVID-19. Prior, she had paroxysmal atrial fibrillation and was maintained on sotalol. She is now off sotalol.  - s/p DCCV 11/4. Remains in NSR.  - On amiodarone 60 mg/hr. Continue load and transition to PO. - Continue Xarelto 20 mg daily   - Eventually would benefit from atrial fibrillation ablation ([facility]-HF study).   Keep K >4.0 and Mg >2.0 - given body habitus, needs outpatient sleep study if no previous evaluation  3. Mitral regurgitation: Functional MR, moderate.  4. Diverticulitis: Afebrile, WBCs not elevated.  - Continue cefdinir and Flagyl per IM.  5. Hypothyroidism: on levothyroxine.  6. Hypokalemia: resolved w/ supplementation. K 3.9 this am. Will supp w/ IV Lasix dose 7. Hyperglycemia/Prediabetes: CBGs improving, in the 170s this am. Hgb A1c 04/2019 was 6.1  -management per IM    Length of Stay: [address] PA-C  06/20/2019, 7:32 AM  Advanced Heart Failure Team Pager [phone] (M-F; 7a - 4p)  Please contact [facility] for night-coverage after hours (4p -7a ) and weekends on [email or web address]  Patient seen with PA, agree with the above note.   She remains in NSR after TEE-guided DCCV yesterday.  CVP 9, co-ox 76%, creatinine stable.   General: NAD Neck:  No JVD, no thyromegaly or thyroid nodule.  Lungs: Clear to auscultation bilaterally with normal respiratory effort. CV: Nondisplaced PMI.  Heart regular S1/S2, no S3/S4, no murmur.  No peripheral edema.   Abdomen: Soft, nontender, no hepatosplenomegaly, no distention.  Skin: Intact without lesions or rashes.  Neurologic: Alert and oriented x 3.  Psych: Normal affect. Extremities: No clubbing or cyanosis.  HEENT: Normal.   I will continue IV amiodarone gtt for one more day as she was quite difficult to cardiovert.  Transition to po tomorrow.  Continue Xarelto.   Agree with 1 dose of IV Lasix today, can start torsemide 40 daily tomorrow.  Start losartan 12.5 mg bid today with SBP around 110.  Continue digoxin and spironolactone.   Will arrange for cardiac MRI, ?evidence for myocarditis.   She could go to step-down.   [name] 06/20/2019 9:17 AM

## 2019-06-20 NOTE — Plan of Care (Signed)
  Problem: Education: Goal: Knowledge of General Education information will improve Description: Including pain rating scale, medication(s)/side effects and non-pharmacologic comfort measures Outcome: Progressing   Problem: Health Behavior/Discharge Planning: Goal: Ability to manage health-related needs will improve Outcome: Progressing   Problem: Clinical Measurements: Goal: Ability to maintain clinical measurements within normal limits will improve Outcome: Progressing Goal: Respiratory complications will improve Outcome: Progressing Goal: Cardiovascular complication will be avoided Outcome: Progressing   Problem: Activity: Goal: Risk for activity intolerance will decrease Outcome: Progressing   Problem: Nutrition: Goal: Adequate nutrition will be maintained Outcome: Progressing   Problem: Coping: Goal: Level of anxiety will decrease Outcome: Progressing   Problem: Elimination: Goal: Will not experience complications related to urinary retention Outcome: Progressing   Problem: Pain Managment: Goal: General experience of comfort will improve Outcome: Progressing   Problem: Skin Integrity: Goal: Risk for impaired skin integrity will decrease Outcome: Progressing   Problem: Cardiac: Goal: Ability to achieve and maintain adequate cardiopulmonary perfusion will improve Outcome: Progressing

## 2019-06-20 NOTE — Progress Notes (Signed)
Patient went into Afib RVR with HR sustaining in the 140's-160's.Patient denies chest pain and BP was 107/86. 12 lead EKG was done and MD was paged. Verbal orders were to give 150 mg Amiodarone bolus. Will continue to monitor.

## 2019-06-21 ENCOUNTER — Inpatient Hospital Stay (HOSPITAL_COMMUNITY): Payer: Medicare Other | Admitting: Anesthesiology

## 2019-06-21 ENCOUNTER — Encounter (HOSPITAL_COMMUNITY): Payer: Self-pay | Admitting: *Deleted

## 2019-06-21 ENCOUNTER — Encounter (HOSPITAL_COMMUNITY)
Admission: EM | Disposition: A | Discharge status: Home Health | Payer: Self-pay | Source: Home / Self Care | Attending: Family Medicine

## 2019-06-21 HISTORY — PX: CARDIOVERSION: SHX1299

## 2019-06-21 LAB — BASIC METABOLIC PANEL
Anion gap: 12 (ref 5–15)
BUN: <5 mg/dL — ABNORMAL LOW (ref 8–23)
CO2: 26 mmol/L (ref 22–32)
Calcium: 8.6 mg/dL — ABNORMAL LOW (ref 8.9–10.3)
Chloride: 96 mmol/L — ABNORMAL LOW (ref 98–111)
Creatinine, Ser: 0.77 mg/dL (ref 0.44–1.00)
GFR calc Af Amer: >60 mL/min (ref >60)
GFR calc non Af Amer: >60 mL/min (ref >60)
Glucose, Bld: 204 mg/dL — ABNORMAL HIGH (ref 70–99)
Potassium: 3.7 mmol/L (ref 3.5–5.1)
Sodium: 134 mmol/L — ABNORMAL LOW (ref 135–145)

## 2019-06-21 LAB — GLUCOSE, CAPILLARY
Glucose-Capillary: 115 mg/dL — ABNORMAL HIGH (ref 70–99)
Glucose-Capillary: 134 mg/dL — ABNORMAL HIGH (ref 70–99)
Glucose-Capillary: 211 mg/dL — ABNORMAL HIGH (ref 70–99)
Glucose-Capillary: 138 mg/dL — ABNORMAL HIGH (ref 70–99)
Glucose-Capillary: 183 mg/dL — ABNORMAL HIGH (ref 70–99)
Glucose-Capillary: 180 mg/dL — ABNORMAL HIGH (ref 70–99)
Glucose-Capillary: 121 mg/dL — ABNORMAL HIGH (ref 70–99)

## 2019-06-21 LAB — CBC
HCT: 38.9 % (ref 36.0–46.0)
Hemoglobin: 11.6 g/dL — ABNORMAL LOW (ref 12.0–15.0)
MCH: 27.1 pg (ref 26.0–34.0)
MCHC: 29.8 g/dL — ABNORMAL LOW (ref 30.0–36.0)
MCV: 90.9 fL (ref 80.0–100.0)
Platelets: 364 10*3/uL (ref 150–400)
RBC: 4.28 MIL/uL (ref 3.87–5.11)
RDW: 16.1 % — ABNORMAL HIGH (ref 11.5–15.5)
WBC: 7.6 10*3/uL (ref 4.0–10.5)
nRBC: 0 % (ref 0.0–0.2)

## 2019-06-21 LAB — DIGOXIN LEVEL: Digoxin Level: 0.4 ng/mL — ABNORMAL LOW (ref 0.8–2.0)

## 2019-06-21 LAB — COOXEMETRY PANEL
Carboxyhemoglobin: 1.7 % — ABNORMAL HIGH (ref 0.5–1.5)
Methemoglobin: 0.9 % (ref 0.0–1.5)
O2 Saturation: 64.9 %
Total hemoglobin: 16.1 g/dL — ABNORMAL HIGH (ref 12.0–16.0)

## 2019-06-21 LAB — MAGNESIUM: Magnesium: 1.9 mg/dL (ref 1.7–2.4)

## 2019-06-21 SURGERY — CARDIOVERSION
Anesthesia: General

## 2019-06-21 MED ORDER — LIDOCAINE 2% (20 MG/ML) 5 ML SYRINGE
INTRAMUSCULAR | Status: DC | PRN
  Administered 2019-06-21: 100 mg via INTRAVENOUS

## 2019-06-21 MED ORDER — SODIUM CHLORIDE 0.9% FLUSH
3.0000 mL | Freq: Two times a day (BID) | INTRAVENOUS | Status: DC
  Administered 2019-06-21 – 2019-06-25 (×5): 3 mL via INTRAVENOUS

## 2019-06-21 MED ORDER — POTASSIUM CHLORIDE CRYS ER 20 MEQ PO TBCR
40.0000 meq | EXTENDED_RELEASE_TABLET | Freq: Once | ORAL | Status: AC
  Administered 2019-06-21: 40 meq via ORAL
  Filled 2019-06-21: qty 2

## 2019-06-21 MED ORDER — SPIRONOLACTONE 25 MG PO TABS
25.0000 mg | ORAL_TABLET | Freq: Every day | ORAL | Status: DC
  Administered 2019-06-21 – 2019-06-26 (×6): 25 mg via ORAL
  Filled 2019-06-21 (×6): qty 1

## 2019-06-21 MED ORDER — MAGNESIUM SULFATE 2 GM/50ML IV SOLN
2.0000 g | Freq: Once | INTRAVENOUS | Status: AC
  Administered 2019-06-21: 2 g via INTRAVENOUS
  Filled 2019-06-21: qty 50

## 2019-06-21 MED ORDER — AMIODARONE IV BOLUS ONLY 150 MG/100ML
150.0000 mg | Freq: Once | INTRAVENOUS | Status: AC
  Administered 2019-06-21: 150 mg via INTRAVENOUS

## 2019-06-21 MED ORDER — PROPOFOL 10 MG/ML IV BOLUS
INTRAVENOUS | Status: DC | PRN
  Administered 2019-06-21: 70 mg via INTRAVENOUS

## 2019-06-21 MED ORDER — SODIUM CHLORIDE 0.9 % IV SOLN: 250.0000 mL | INTRAVENOUS | Status: DC

## 2019-06-21 MED ORDER — SODIUM CHLORIDE 0.9% FLUSH: 3.0000 mL | INTRAVENOUS | Status: DC | PRN

## 2019-06-21 NOTE — Progress Notes (Signed)
PROGRESS NOTE  Melinda Mckinney [id number] DOB: 06-10-1956 DOA: 06/11/2019 PCP: Colon Branch, MD  HPI/Recap of past 24 hours: Melinda Mckinney is a 63 y.o. female with medical history significant of DM2, A.Fib on xarelto, CAD s/p stent.  Patient originally diagnosed with COVID19 on 9/15.  Admitted with gastroenteritis due to COVID 10/9-10/13.  Developed worsening abd pain later in the month, CT abd/pelvis on 10/22 showed new acute diverticulitis not present on CT 10/9. She was put on cipro/flagyl.  She continued to have left lower quadrant abdominal pain so came back to the emergency room. In the emergency room, repeat CT scan of the abdomen pelvis showed improvement of diverticulitis.  Blood pressure was soft.  Lactate was 2.7.  Started on antibiotics.  Found to be on A. fib with RVR and treated with Cardizem drip. Echocardiogram showed severely reduced ejection fraction to 25%.  Underwent cardiac catheterization with normal coronaries.  Patient was started on milrinone, amiodarone and Lasix and admitted to progressive care unit.  06/19/2019: Remains persistent A. fib with RVR, underwent TEE and cardioversion with successful conversion to sinus rhythm and remains on amiodarone drip.  EF is 15%. 06/21/2019: Patient went back to rapid A. fib still on amnio drip.  Underwent another TEE cardioversion.  Subjective: Patient seen and examined in the morning before procedure.  No overnight events.  Patient herself denies any complaints.  Overnight with A. fib RVR.  She was just anxious but no other symptoms.  Assessment/Plan: Principal Problem:   Sepsis ([place]) Active Problems:   Essential hypertension   Coronary artery disease involving native coronary artery of native heart with angina pectoris (HCC)   Atrial fibrillation with rapid ventricular response (HCC)   Chronic systolic CHF (congestive heart failure) ([place])   COVID-19   Diverticulitis  Paroxysmal atrial fibrillation with RVR Persistent and  recurrent rapid A. fib, cardioversion on 06/19/2019 and repeat on 06/21/2019. On amiodarone maintenance. On Xarelto for anticoagulation.  Acute on chronic systolic CHF, nonischemic cardiomyopathy. Patient with reduced EF of less than 20%.  Previous EF of 45-50% from 2018. Unknown etiology. Recent Covid infection and currently in atrial fibrillation.  Followed by heart failure team. 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  MRI with normal myocardium. Was on IV Lasix with good response and diuresing.   Now on torsemide for gentle diuresis. Milrinone stopped. Added Aldactone and losartan.  Acute sigmoid diverticulitis Failed outpatient treatment with ciprofloxacin/flagyl.  Symptoms improved.   Already received prolonged treatment.   Finished therapy.  Symptoms improved.     Essential hypertension/soft blood pressures Blood pressure soft and stable.  Pre-diabetes No evidence of diabetes on chart review. Highest hemoglobin A1C is 6.1%. Recommend diet modification.  Hypothyroidism Recent TSH of 1.6 -Continue Synthroid  Recent COVID-19 pneumonia Symptoms improved.   DVT prophylaxis: Xarelto Code Status:  Full Code Family Communication:  None Disposition Plan:  Pending clinical improvement.  Remains in progressive care unit.  Anticipate discharge home with home health care after clinical improvement.  Consultants:   Cardiology  Procedures:   11/1: Transthoracic Echocardiogram IMPRESSIONS   1. Left ventricular ejection fraction, by visual estimation, is 25 to 30%. The left ventricle has severely decreased function. There is no left ventricular hypertrophy. 2. Left ventricular diastolic parameters are indeterminate. 3. The left ventricle demonstrates global hypokinesis. 4. Global right ventricle has mildly reduced systolic function.The right ventricular size is normal. No increase in right ventricular wall thickness. 5. Left atrial size was moderately dilated. 6.  Right atrial size was mildly  dilated. 7. The mitral valve is normal in structure. Moderate mitral valve regurgitation. No evidence of mitral stenosis. 8. The tricuspid valve is normal in structure. Tricuspid valve regurgitation moderate. 9. The aortic valve is normal in structure. Aortic valve regurgitation is mild. No evidence of aortic valve sclerosis or stenosis. 10. The pulmonic valve was normal in structure. Pulmonic valve regurgitation is mild. 11. Aortic dilatation noted. 12. There is mild dilatation of the ascending aorta measuring 39 mm. 13. Mildly elevated pulmonary artery systolic pressure. 14. The inferior vena cava is normal in size with greater than 50% respiratory variability, suggesting right atrial pressure of 3 mmHg.  FINDINGS Left Ventricle: Left ventricular ejection fraction, by visual estimation, is 25 to 30%. The left ventricle has severely decreased function. The left ventricle demonstrates global hypokinesis. There is no left ventricular hypertrophy. Left ventricular  diastolic parameters are indeterminate. Normal left atrial pressure.  Right Ventricle: The right ventricular size is normal. No increase in right ventricular wall thickness. Global RV systolic function is has mildly reduced systolic function. The tricuspid regurgitant velocity is 2.46 m/s, and with an assumed right atrial  pressure of 8 mmHg, the estimated right ventricular systolic pressure is mildly elevated at 32.2 mmHg.  Left Atrium: Left atrial size was moderately dilated.  Right Atrium: Right atrial size was mildly dilated  Pericardium: There is no evidence of pericardial effusion.  Mitral Valve: The mitral valve is normal in structure. No evidence of mitral valve stenosis by observation. Moderate mitral valve regurgitation.  Tricuspid Valve: The tricuspid valve is normal in structure. Tricuspid valve regurgitation moderate.  Aortic Valve: The aortic valve is normal in structure.  Aortic valve regurgitation is mild. Aortic regurgitation PHT measures 835 msec. The aortic valve is structurally normal, with no evidence of sclerosis or stenosis.  Pulmonic Valve: The pulmonic valve was normal in structure. Pulmonic valve regurgitation is mild.  Aorta: Aortic dilatation noted. There is mild dilatation of the ascending aorta measuring 39 mm.  Venous: The inferior vena cava is normal in size with greater than 50% respiratory variability, suggesting right atrial pressure of 3 mmHg.  IAS/Shunts: No atrial level shunt detected by color flow Doppler. No ventricular septal defect is seen or detected. There is no evidence of an atrial septal defect.  Antimicrobials:  Ceftriaxone (10/27>> 06/20/2019  Flagyl (10/27>>  06/20/2019   Objective: Vitals:   06/21/19 1100 06/21/19 1130 06/21/19 1226 06/21/19 1236  BP:  130/79 109/69 113/70  Pulse:  (!) 148 95 100  Resp:  18 14 18   Temp: 98.2 F (36.8 C) 98 F (36.7 C) (!) 96.3 F (35.7 C)   TempSrc: Oral Temporal Temporal   SpO2:  96% 100% 99%  Weight:      Height:        Intake/Output Summary (Last 24 hours) at 06/21/2019 1256 Last data filed at 06/21/2019 1224 Gross per 24 hour  Intake 1546.05 ml  Output 1170 ml  Net 376.05 ml   Filed Weights   06/19/19 0400 06/20/19 0500 06/21/19 0500  Weight: 106.3 kg 107.2 kg 107.4 kg    Exam:  Physical Exam  Constitutional: She is oriented to person, place, and time and well-developed, well-nourished, and in no distress. No distress.  Cardiovascular:  She was tachycardic with irregularly irregular rhythm.  No murmurs.  Pulmonary/Chest: Breath sounds normal.  Abdominal: Bowel sounds are normal.  Musculoskeletal:        General: No deformity or edema.  Neurological: She is alert and oriented to person,  place, and time.  Psychiatric: Mood and affect normal.   Data Reviewed: CBC: Recent Labs  Lab 06/17/19 0751  06/18/19 0223 06/18/19 2239 06/19/19 0241 06/19/19  0348 06/20/19 0445 06/21/19 0405  WBC 8.4  --  7.2  --  7.6  --  6.4 7.6  HGB 11.5*   < > 10.5* 12.2 11.2* 12.2 10.8* 11.6*  HCT 38.7   < > 34.8* 36.0 36.7 36.0 35.6* 38.9  MCV 93.5  --  91.1  --  89.1  --  90.1 90.9  PLT 297  --  284  --  322  --  285 364   < > = values in this interval not displayed.   Basic Metabolic Panel: Recent Labs  Lab 06/18/19 1200 06/18/19 2237 06/18/19 2239 06/19/19 0241 06/19/19 0348 06/20/19 0445 06/21/19 0405  NA 135 134* 133* 134* 136 136 134*  K 3.4* 3.4* 3.5 4.0 4.0 3.9 3.7  CL 91* 94*  --  92*  --  97* 96*  CO2 27 29  --  28  --  27 26  GLUCOSE 337* 270*  --  273*  --  177* 204*  BUN 6* 5*  --  6*  --  7* <5*  CREATININE 0.92 0.81  --  0.78  --  0.78 0.77  CALCIUM 8.5* 8.2*  --  8.3*  --  8.5* 8.6*  MG 1.2* 2.2  --  2.0  --  1.9 1.9   GFR: Estimated Creatinine Clearance: 85.4 mL/min (by C-G formula based on SCr of 0.77 mg/dL). Liver Function Tests: No results for input(s): AST, ALT, ALKPHOS, BILITOT, PROT, ALBUMIN in the last 168 hours. No results for input(s): LIPASE, AMYLASE in the last 168 hours. No results for input(s): AMMONIA in the last 168 hours. Coagulation Profile: No results for input(s): INR, PROTIME in the last 168 hours. Cardiac Enzymes: No results for input(s): CKTOTAL, CKMB, CKMBINDEX, TROPONINI in the last 168 hours. BNP (last 3 results) No results for input(s): PROBNP in the last 8760 hours. HbA1C: No results for input(s): HGBA1C in the last 72 hours. CBG: Recent Labs  Lab 06/20/19 1943 06/21/19 0027 06/21/19 0406 06/21/19 0755 06/21/19 1100  GLUCAP 183* 115* 211* 134* 138*   Lipid Profile: No results for input(s): CHOL, HDL, LDLCALC, TRIG, CHOLHDL, LDLDIRECT in the last 72 hours. Thyroid Function Tests: Recent Labs    06/19/19 0241  FREET4 2.70*  T3FREE 2.6   Anemia Panel: No results for input(s): VITAMINB12, FOLATE, FERRITIN, TIBC, IRON, RETICCTPCT in the last 72 hours. Urine analysis:     Component Value Date/Time   COLORURINE AMBER (A) 06/11/2019 1526   APPEARANCEUR CLEAR 06/11/2019 1526   LABSPEC >1.046 (H) 06/11/2019 1526   PHURINE 6.0 06/11/2019 1526   GLUCOSEU NEGATIVE 06/11/2019 1526   GLUCOSEU NEGATIVE 06/06/2019 1538   HGBUR SMALL (A) 06/11/2019 1526   BILIRUBINUR NEGATIVE 06/11/2019 [place] 06/11/2019 1526   PROTEINUR 30 (A) 06/11/2019 1526   UROBILINOGEN 0.2 06/06/2019 1538   NITRITE NEGATIVE 06/11/2019 1526   LEUKOCYTESUR TRACE (A) 06/11/2019 1526   Sepsis Labs: @LABRCNTIP (procalcitonin:4,lacticidven:4)  ) Recent Results (from the past 240 hour(s))  Urine culture     Status: Abnormal   Collection Time: 06/11/19  3:26 PM   Specimen: In/Out Cath Urine  Result Value Ref Range Status   Specimen Description   Final    IN/OUT CATH URINE Performed at [facility], [address] [place], [place] [address]  Special Requests   Final    NONE Performed at [facility], [address] [place], [place] [address]    Culture 10,000 COLONIES/mL YEAST (A)  Final   Report Status 06/13/2019 FINAL  Final  MRSA PCR Screening     Status: None   Collection Time: 06/11/19  9:52 PM   Specimen: Nasal Mucosa; Nasopharyngeal  Result Value Ref Range Status   MRSA by PCR NEGATIVE NEGATIVE Final    Comment:        The GeneXpert MRSA Assay (FDA approved for NASAL specimens only), is one component of a comprehensive MRSA colonization surveillance program. It is not intended to diagnose MRSA infection nor to guide or monitor treatment for MRSA infections. Performed at [facility], [place] [address], [place], [place] [address]       Studies: No results found.  Scheduled Meds: . allopurinol  100 mg Oral QHS  . atorvastatin  40 mg Oral QHS  . Chlorhexidine Gluconate Cloth  6 each Topical Daily  . digoxin  0.125 mg Oral Daily  . DULoxetine  60 mg Oral Daily  . Gerhardt's butt cream   Topical QID  .  insulin aspart  0-9 Units Subcutaneous Q4H  . levothyroxine  125 mcg Oral QAC breakfast  . losartan  12.5 mg Oral BID  . magic mouthwash  15 mL Oral QID  . nystatin   Topical BID  . pantoprazole  40 mg Oral BID  . raloxifene  60 mg Oral QHS  . rivaroxaban  20 mg Oral Daily  . sodium chloride flush  10-40 mL Intracatheter Q12H  . sodium chloride flush  10-40 mL Intracatheter Q12H  . sodium chloride flush  3 mL Intravenous Q12H  . spironolactone  25 mg Oral Daily  . torsemide  40 mg Oral Daily    Continuous Infusions: . sodium chloride 0 mL (06/19/19 1306)  . sodium chloride    . amiodarone 60 mg/hr (06/21/19 1156)     LOS: 10 days   Total time spent: 30 minutes.

## 2019-06-21 NOTE — Procedures (Signed)
Electrical Cardioversion Procedure Note Melinda Mckinney CN:[id number] [birth year]  Procedure: Electrical Cardioversion Indications:  Atrial Fibrillation  Procedure Details Consent: Risks of procedure as well as the alternatives and risks of each were explained to the (patient/caregiver).  Consent for procedure obtained. Time Out: Verified patient identification, verified procedure, site/side was marked, verified correct patient position, special equipment/implants available, medications/allergies/relevent history reviewed, required imaging and test results available.  Performed  Patient placed on cardiac monitor, pulse oximetry, supplemental oxygen as necessary.  Sedation given: Propofol per anesthesiology Pacer pads placed anterior and posterior chest.  Cardioverted 1 time(s).  Cardioverted at [place].  Evaluation Findings: Post procedure EKG shows: NSR Complications: None Patient did tolerate procedure well.   Melinda Mckinney 06/21/2019, 12:21 PM

## 2019-06-21 NOTE — H&P (View-Only) (Signed)
Patient ID: Melinda Mckinney, female   DOB: 28-Apr-1956, 63 y.o.   MRN: [id number]     Advanced Heart Failure Rounding Note  PCP-Cardiologist: Skeet Latch, MD   Subjective:    Echo 06/16/19 with severely reduced EF, 25-30%. LHC 11/2 w/ normal cors. RHC w/ high filling pressures and low CO.   Started on milrinone 11/2 for low CO. CI 1.6 on RHC. Initial Co-ox was 60%. Hospital course c/b afib w/ RVR. Milrinone discontinued 11/4. On digoxin. Co-ox good off of milrinone at 76.3%.   S/p TEE/DCCV 11/4. Cardioverted 4 times, using sternal pressure after the first attempt.   Cardioverted at [place].   She remained in NSR until last night, then back to afib/RVR with HR 140s. BP stable.   CVP 11 this morning with co-ox 65%.    IM managing diverticulitis, now off antibiotics and no more abdominal pain.    - TEE: EF 15% (in setting of rapid afib), moderate LV dilation, moderately dilated/dysfunctional RV, moderate MR.  - Cardiac MRI: LV EF 34%, RV EF 30%, no LGE.    Objective:   Weight Range: 107.4 kg Body mass index is 39.4 kg/m.   Vital Signs:   Temp:  [98.1 F (36.7 C)-98.3 F (36.8 C)] 98.3 F (36.8 C) (11/06 0745) Pulse Rate:  [46-167] 46 (11/06 0800) Resp:  [15-29] 17 (11/06 0800) BP: (83-131)/(54-101) 113/80 (11/06 0800) SpO2:  [94 %-99 %] 95 % (11/06 0800) Weight:  [107.4 kg] 107.4 kg (11/06 0500) Last BM Date: 06/20/19  Weight change: Filed Weights   06/19/19 0400 06/20/19 0500 06/21/19 0500  Weight: 106.3 kg 107.2 kg 107.4 kg    Intake/Output:   Intake/Output Summary (Last 24 hours) at 06/21/2019 0815 Last data filed at 06/21/2019 0700 Gross per 24 hour  Intake 1516.12 ml  Output 1870 ml  Net -353.88 ml      Physical Exam   CVP 11-12 General: NAD Neck: JVP 8 cm, no thyromegaly or thyroid nodule.  Lungs: Clear to auscultation bilaterally with normal respiratory effort. CV: Nondisplaced PMI.  Heart tachy, irregular S1/S2, no S3/S4, no murmur.  No peripheral  edema.   Abdomen: Soft, nontender, no hepatosplenomegaly, no distention.  Skin: Intact without lesions or rashes.  Neurologic: Alert and oriented x 3.  Psych: Normal affect. Extremities: No clubbing or cyanosis.  HEENT: Normal.    Telemetry   Atrial fibrillation 140s  EKG    N/A  Labs    CBC Recent Labs    06/20/19 0445 06/21/19 0405  WBC 6.4 7.6  HGB 10.8* 11.6*  HCT 35.6* 38.9  MCV 90.1 90.9  PLT 285 123456   Basic Metabolic Panel Recent Labs    06/20/19 0445 06/21/19 0405  NA 136 134*  K 3.9 3.7  CL 97* 96*  CO2 27 26  GLUCOSE 177* 204*  BUN 7* <5*  CREATININE 0.78 0.77  CALCIUM 8.5* 8.6*  MG 1.9 1.9   Liver Function Tests No results for input(s): AST, ALT, ALKPHOS, BILITOT, PROT, ALBUMIN in the last 72 hours. No results for input(s): LIPASE, AMYLASE in the last 72 hours. Cardiac Enzymes No results for input(s): CKTOTAL, CKMB, CKMBINDEX, TROPONINI in the last 72 hours.  BNP: BNP (last 3 results) Recent Labs    05/26/19 0720 05/27/19 0245 05/28/19 0630  BNP 604.0* 330.7* 345.9*    ProBNP (last 3 results) No results for input(s): PROBNP in the last 8760 hours.   D-Dimer No results for input(s): DDIMER in the last 72 hours. Hemoglobin A1C  No results for input(s): HGBA1C in the last 72 hours. Fasting Lipid Panel No results for input(s): CHOL, HDL, LDLCALC, TRIG, CHOLHDL, LDLDIRECT in the last 72 hours. Thyroid Function Tests Recent Labs    06/19/19 0241  T3FREE 2.6    Other results:   Imaging    Mr Cardiac Morphology W Wo Contrast  Result Date: 06/20/2019 CLINICAL DATA:  Nonischemic cardiomyopathy. EXAM: CARDIAC MRI TECHNIQUE: The patient was scanned on a 1.5 Tesla GE magnet. A dedicated cardiac coil was used. Functional imaging was done using Fiesta sequences. 2,3, and 4 chamber views were done to assess for RWMA's. Modified Simpson's rule using a short axis stack was used to calculate an ejection fraction on a dedicated work Ecologist. The patient received 8 cc of Gadavist. After 10 minutes inversion recovery sequences were used to assess for infiltration and scar tissue. FINDINGS: Limited images of the lung fields showed no gross abnormalities. Trivial pericardial effusion. Mildly dilated left ventricle with normal wall thickness. Diffuse moderate hypokinesis, EF 34%. Normal right ventricular size with moderately decreased systolic function, EF A999333. Moderate left atrial enlargement, mild right atrial enlargement. Trileaflet aortic valve with no stenosis, mild regurgitation. Mild-moderate mitral regurgitation visually. On delayed enhancement imaging, there was no myocardial late gadolinium enhancement (LGE) noted. Measurements: LVEDV 172 mL LVSV 59 mL LVEF 34% RVEDV 113 mL RVSV 33 mL RVEF 30% IMPRESSION: 1.  Mildly dilated LV with diffuse moderate hypokinesis, EF 34%. 2. Normal RV size with moderately decreased systolic function, EF A999333. 3. No myocardial LGE, so no definitive evidence for prior MI, infiltrative disease, or myocarditis. Melinda Mckinney Electronically Signed   By: [name] M.D.   On: 06/20/2019 13:07     Medications:     Scheduled Medications: . allopurinol  100 mg Oral QHS  . atorvastatin  40 mg Oral QHS  . Chlorhexidine Gluconate Cloth  6 each Topical Daily  . digoxin  0.125 mg Oral Daily  . DULoxetine  60 mg Oral Daily  . Gerhardt's butt cream   Topical QID  . insulin aspart  0-9 Units Subcutaneous Q4H  . levothyroxine  125 mcg Oral QAC breakfast  . losartan  12.5 mg Oral BID  . magic mouthwash  15 mL Oral QID  . nystatin   Topical BID  . pantoprazole  40 mg Oral BID  . raloxifene  60 mg Oral QHS  . rivaroxaban  20 mg Oral Daily  . sodium chloride flush  10-40 mL Intracatheter Q12H  . sodium chloride flush  10-40 mL Intracatheter Q12H  . spironolactone  25 mg Oral Daily  . torsemide  40 mg Oral Daily    Infusions: . sodium chloride Stopped (06/19/19 1306)  . sodium chloride  Stopped (06/21/19 0644)  . amiodarone 60 mg/hr (06/21/19 0700)    PRN Medications: sodium chloride, acetaminophen, cyclobenzaprine, HYDROmorphone (DILAUDID) injection, morphine injection, ondansetron (ZOFRAN) IV, ondansetron **OR** [DISCONTINUED] ondansetron (ZOFRAN) IV, sodium chloride flush, sodium chloride flush, zolpidem    Patient Profile   63 y/o female w/ h/o CAD s/p PCI in 2010, chronic combined systolic and diastolic HF (EF Q000111Q in 2018), atrial fibrillation on chronic a/c w/ Xarelto, HTN, prediabetes, hypothyroidism and diagnosed w/ COVID 19 in Sep 2020 and was treated at [place] infection c/b PNA and gastroenteritis. Admitted to [facility] 10/27 for diverticulitis and afib w/ RVR and found to have worsening systolic function w/ EF now at 25-30%. Subsequent LHC showed widely patent coronaries. RHC c/w severely  elevated filling pressures and low CO.   Assessment/Plan   1. Acute systolic CHF:  Nonischemic cardiomyopathy.  Echo in 2018 with EF 45-50%.  Echo this admission with EF 25-30%, diffuse hypokinesis, mildly decreased RV systolic function, moderate MR. LHC/RHC this admission with no significant coronary disease, markedly elevated filling pressures and low cardiac output (CI 1.6).  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 Cardiac MRI 11/5 showed LVEF 34%, RVEF 30%, no LGE => perhaps pointing to tachy-mediated CMP as more likely etiology.  SCr 0.77 (down from 2.3 3 wks ago). Initially required inotropic support w/ milrinone but discontinued 11/4. Co-ox 65% this morning, CVP up a bit to 11-12.  - Torsemide 40 mg daily ordered starting today.  Will not aggressively diurese her this morning as she is back in afib/RVR.  - Continue losartan 12.5 mg bid.    - Increase spironolactone to 25 daily.  - Continue digoxin 0.125 daily, level ok today.  2. Atrial fibrillation with RVR: Persistent  atrial fibrillation, it appears, at least since 9/20 when she was admitted with COVID-19. Prior, she had paroxysmal atrial fibrillation and was maintained on sotalol. She is now off sotalol.  Afib has been difficult to rate control, initially had DCCV 11/4 back to NSR.  Went back to afib/RVR in 140s last night, unable to get rate down despite amiodarone gtt.    - On amiodarone 60 mg/hr, continue.  - Continue Xarelto 20 mg daily   - I will try to arrange for DCCV again today.  If we cannot keep her in NSR and HR stays uncontrolled, she may need AVN ablation/BiV pacing.  Risks/benefits of DCCV discussed with patient and son, she agrees to procedure.  - Eventually would benefit from atrial fibrillation ablation (CASTLE-HF study).   Keep K >4.0 and Mg >2.0 - given body habitus, needs outpatient sleep study if no previous evaluation  3. Mitral regurgitation: Functional MR, moderate on TEE.  In NSR on cardiac MRI, MR was less impressive.  4. Diverticulitis: Afebrile, WBCs not elevated. Cefdinir and Flagyl course has been completed.  5. Hypothyroidism: on levothyroxine.  6. Hypokalemia: Supplement.  7. Hyperglycemia/Prediabetes: CBGs improving, in the 170s this am. Hgb A1c 04/2019 was 6.1  -management per IM   CRITICAL CARE Performed by: [name]  Total critical care time: 35 minutes  Critical care time was exclusive of separately billable procedures and treating other patients.  Critical care was necessary to treat or prevent imminent or life-threatening deterioration.  Critical care was time spent personally by me on the following activities: development of treatment plan with patient and/or surrogate as well as nursing, discussions with consultants, evaluation of patient's response to treatment, examination of patient, obtaining history from patient or surrogate, ordering and performing treatments and interventions, ordering and review of laboratory studies, ordering and review of radiographic  studies, pulse oximetry and re-evaluation of patient's condition.  [name] 06/21/2019 8:28 AM

## 2019-06-21 NOTE — Transfer of Care (Signed)
Immediate Anesthesia Transfer of Care Note  Patient: Melinda Mckinney  Procedure(s) Performed: CARDIOVERSION (N/A )  Patient Location: PACU and Endoscopy Unit  Anesthesia Type:General  Level of Consciousness: drowsy  Airway & Oxygen Therapy: Patient Spontanous Breathing and Patient connected to nasal cannula oxygen  Post-op Assessment: Report given to RN and Post -op Vital signs reviewed and stable  Post vital signs: Reviewed and stable  Last Vitals:  Vitals Value Taken Time  BP 109/69 06/21/19 1226  Temp    Pulse 95 06/21/19 1226  Resp 14 06/21/19 1226  SpO2 100 % 06/21/19 1226  Vitals shown include unvalidated device data.  Last Pain:  Vitals:   06/21/19 1130  TempSrc: Temporal  PainSc: 0-No pain      Patients Stated Pain Goal: 0 (Q000111Q [id number])  Complications: No apparent anesthesia complications

## 2019-06-21 NOTE — Progress Notes (Signed)
PT Cancellation Note  Patient Details Name: Melinda Mckinney MRN: [id number] DOB: Jan 07, 1956   Cancelled Treatment:    Reason Eval/Treat Not Completed: Patient declined, no reason specified. PT attempted to see patient however patient declining due to pain in L side of chest since cardioversion. Pt also reporting that she needs to rest after procedure. PT informs RN of pt complaints of pain. PT will continue to follow per PT POC.  Melinda Mckinney, PT, DPT Acute Rehabilitation Pager: [phone]    Melinda Mckinney 06/21/2019, 2:43 PM

## 2019-06-21 NOTE — Progress Notes (Signed)
Patient is still in Afib RVR with HR in the 150's. MD was paged. Verbal orders were to give another 150 mg Amiodarone bolus and if patient still remains in Afib after giving the bolus, just monitor closely until shift change. Patient is currently asymptomatic. Will continue to monitor for any changes.

## 2019-06-21 NOTE — Anesthesia Postprocedure Evaluation (Signed)
Anesthesia Post Note  Patient: Melinda Mckinney  Procedure(s) Performed: CARDIOVERSION (N/A )     Patient location during evaluation: PACU Anesthesia Type: General Level of consciousness: awake and alert Pain management: pain level controlled Vital Signs Assessment: post-procedure vital signs reviewed and stable Respiratory status: spontaneous breathing, nonlabored ventilation, respiratory function stable and patient connected to nasal cannula oxygen Cardiovascular status: blood pressure returned to baseline and stable Postop Assessment: no apparent nausea or vomiting Anesthetic complications: no    Last Vitals:  Vitals:   06/21/19 1254 06/21/19 1300  BP: 103/77 106/81  Pulse: 95 91  Resp: 17 14  Temp:    SpO2: 96% 94%    Last Pain:  Vitals:   06/21/19 1226  TempSrc: Temporal  PainSc: Asleep                 Riker Collier DAVID

## 2019-06-21 NOTE — Progress Notes (Signed)
Patient ID: Melinda Mckinney, female   DOB: 1956/03/12, 63 y.o.   MRN: [id number]     Advanced Heart Failure Rounding Note  PCP-Cardiologist: Skeet Latch, MD   Subjective:    Echo 06/16/19 with severely reduced EF, 25-30%. LHC 11/2 w/ normal cors. RHC w/ high filling pressures and low CO.   Started on milrinone 11/2 for low CO. CI 1.6 on RHC. Initial Co-ox was 60%. Hospital course c/b afib w/ RVR. Milrinone discontinued 11/4. On digoxin. Co-ox good off of milrinone at 76.3%.   S/p TEE/DCCV 11/4. Cardioverted 4 times, using sternal pressure after the first attempt.   Cardioverted at [place].   She remained in NSR until last night, then back to afib/RVR with HR 140s. BP stable.   CVP 11 this morning with co-ox 65%.    IM managing diverticulitis, now off antibiotics and no more abdominal pain.    - TEE: EF 15% (in setting of rapid afib), moderate LV dilation, moderately dilated/dysfunctional RV, moderate MR.  - Cardiac MRI: LV EF 34%, RV EF 30%, no LGE.    Objective:   Weight Range: 107.4 kg Body mass index is 39.4 kg/m.   Vital Signs:   Temp:  [98.1 F (36.7 C)-98.3 F (36.8 C)] 98.3 F (36.8 C) (11/06 0745) Pulse Rate:  [46-167] 46 (11/06 0800) Resp:  [15-29] 17 (11/06 0800) BP: (83-131)/(54-101) 113/80 (11/06 0800) SpO2:  [94 %-99 %] 95 % (11/06 0800) Weight:  [107.4 kg] 107.4 kg (11/06 0500) Last BM Date: 06/20/19  Weight change: Filed Weights   06/19/19 0400 06/20/19 0500 06/21/19 0500  Weight: 106.3 kg 107.2 kg 107.4 kg    Intake/Output:   Intake/Output Summary (Last 24 hours) at 06/21/2019 0815 Last data filed at 06/21/2019 0700 Gross per 24 hour  Intake 1516.12 ml  Output 1870 ml  Net -353.88 ml      Physical Exam   CVP 11-12 General: NAD Neck: JVP 8 cm, no thyromegaly or thyroid nodule.  Lungs: Clear to auscultation bilaterally with normal respiratory effort. CV: Nondisplaced PMI.  Heart tachy, irregular S1/S2, no S3/S4, no murmur.  No peripheral  edema.   Abdomen: Soft, nontender, no hepatosplenomegaly, no distention.  Skin: Intact without lesions or rashes.  Neurologic: Alert and oriented x 3.  Psych: Normal affect. Extremities: No clubbing or cyanosis.  HEENT: Normal.    Telemetry   Atrial fibrillation 140s  EKG    N/A  Labs    CBC Recent Labs    06/20/19 0445 06/21/19 0405  WBC 6.4 7.6  HGB 10.8* 11.6*  HCT 35.6* 38.9  MCV 90.1 90.9  PLT 285 123456   Basic Metabolic Panel Recent Labs    06/20/19 0445 06/21/19 0405  NA 136 134*  K 3.9 3.7  CL 97* 96*  CO2 27 26  GLUCOSE 177* 204*  BUN 7* <5*  CREATININE 0.78 0.77  CALCIUM 8.5* 8.6*  MG 1.9 1.9   Liver Function Tests No results for input(s): AST, ALT, ALKPHOS, BILITOT, PROT, ALBUMIN in the last 72 hours. No results for input(s): LIPASE, AMYLASE in the last 72 hours. Cardiac Enzymes No results for input(s): CKTOTAL, CKMB, CKMBINDEX, TROPONINI in the last 72 hours.  BNP: BNP (last 3 results) Recent Labs    05/26/19 0720 05/27/19 0245 05/28/19 0630  BNP 604.0* 330.7* 345.9*    ProBNP (last 3 results) No results for input(s): PROBNP in the last 8760 hours.   D-Dimer No results for input(s): DDIMER in the last 72 hours. Hemoglobin A1C  No results for input(s): HGBA1C in the last 72 hours. Fasting Lipid Panel No results for input(s): CHOL, HDL, LDLCALC, TRIG, CHOLHDL, LDLDIRECT in the last 72 hours. Thyroid Function Tests Recent Labs    06/19/19 0241  T3FREE 2.6    Other results:   Imaging    Mr Cardiac Morphology W Wo Contrast  Result Date: 06/20/2019 CLINICAL DATA:  Nonischemic cardiomyopathy. EXAM: CARDIAC MRI TECHNIQUE: The patient was scanned on a 1.5 Tesla GE magnet. A dedicated cardiac coil was used. Functional imaging was done using Fiesta sequences. 2,3, and 4 chamber views were done to assess for RWMA's. Modified Simpson's rule using a short axis stack was used to calculate an ejection fraction on a dedicated work Ecologist. The patient received 8 cc of Gadavist. After 10 minutes inversion recovery sequences were used to assess for infiltration and scar tissue. FINDINGS: Limited images of the lung fields showed no gross abnormalities. Trivial pericardial effusion. Mildly dilated left ventricle with normal wall thickness. Diffuse moderate hypokinesis, EF 34%. Normal right ventricular size with moderately decreased systolic function, EF A999333. Moderate left atrial enlargement, mild right atrial enlargement. Trileaflet aortic valve with no stenosis, mild regurgitation. Mild-moderate mitral regurgitation visually. On delayed enhancement imaging, there was no myocardial late gadolinium enhancement (LGE) noted. Measurements: LVEDV 172 mL LVSV 59 mL LVEF 34% RVEDV 113 mL RVSV 33 mL RVEF 30% IMPRESSION: 1.  Mildly dilated LV with diffuse moderate hypokinesis, EF 34%. 2. Normal RV size with moderately decreased systolic function, EF A999333. 3. No myocardial LGE, so no definitive evidence for prior MI, infiltrative disease, or myocarditis. Kimberl Vig Electronically Signed   By: [name] M.D.   On: 06/20/2019 13:07     Medications:     Scheduled Medications: . allopurinol  100 mg Oral QHS  . atorvastatin  40 mg Oral QHS  . Chlorhexidine Gluconate Cloth  6 each Topical Daily  . digoxin  0.125 mg Oral Daily  . DULoxetine  60 mg Oral Daily  . Gerhardt's butt cream   Topical QID  . insulin aspart  0-9 Units Subcutaneous Q4H  . levothyroxine  125 mcg Oral QAC breakfast  . losartan  12.5 mg Oral BID  . magic mouthwash  15 mL Oral QID  . nystatin   Topical BID  . pantoprazole  40 mg Oral BID  . raloxifene  60 mg Oral QHS  . rivaroxaban  20 mg Oral Daily  . sodium chloride flush  10-40 mL Intracatheter Q12H  . sodium chloride flush  10-40 mL Intracatheter Q12H  . spironolactone  25 mg Oral Daily  . torsemide  40 mg Oral Daily    Infusions: . sodium chloride Stopped (06/19/19 1306)  . sodium chloride  Stopped (06/21/19 0644)  . amiodarone 60 mg/hr (06/21/19 0700)    PRN Medications: sodium chloride, acetaminophen, cyclobenzaprine, HYDROmorphone (DILAUDID) injection, morphine injection, ondansetron (ZOFRAN) IV, ondansetron **OR** [DISCONTINUED] ondansetron (ZOFRAN) IV, sodium chloride flush, sodium chloride flush, zolpidem    Patient Profile   63 y/o female w/ h/o CAD s/p PCI in 2010, chronic combined systolic and diastolic HF (EF Q000111Q in 2018), atrial fibrillation on chronic a/c w/ Xarelto, HTN, prediabetes, hypothyroidism and diagnosed w/ COVID 19 in Sep 2020 and was treated at [place] infection c/b PNA and gastroenteritis. Admitted to [facility] 10/27 for diverticulitis and afib w/ RVR and found to have worsening systolic function w/ EF now at 25-30%. Subsequent LHC showed widely patent coronaries. RHC c/w severely  elevated filling pressures and low CO.   Assessment/Plan   1. Acute systolic CHF:  Nonischemic cardiomyopathy.  Echo in 2018 with EF 45-50%.  Echo this admission with EF 25-30%, diffuse hypokinesis, mildly decreased RV systolic function, moderate MR. LHC/RHC this admission with no significant coronary disease, markedly elevated filling pressures and low cardiac output (CI 1.6).  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 Cardiac MRI 11/5 showed LVEF 34%, RVEF 30%, no LGE => perhaps pointing to tachy-mediated CMP as more likely etiology.  SCr 0.77 (down from 2.3 3 wks ago). Initially required inotropic support w/ milrinone but discontinued 11/4. Co-ox 65% this morning, CVP up a bit to 11-12.  - Torsemide 40 mg daily ordered starting today.  Will not aggressively diurese her this morning as she is back in afib/RVR.  - Continue losartan 12.5 mg bid.    - Increase spironolactone to 25 daily.  - Continue digoxin 0.125 daily, level ok today.  2. Atrial fibrillation with RVR: Persistent  atrial fibrillation, it appears, at least since 9/20 when she was admitted with COVID-19. Prior, she had paroxysmal atrial fibrillation and was maintained on sotalol. She is now off sotalol.  Afib has been difficult to rate control, initially had DCCV 11/4 back to NSR.  Went back to afib/RVR in 140s last night, unable to get rate down despite amiodarone gtt.    - On amiodarone 60 mg/hr, continue.  - Continue Xarelto 20 mg daily   - I will try to arrange for DCCV again today.  If we cannot keep her in NSR and HR stays uncontrolled, she may need AVN ablation/BiV pacing.  Risks/benefits of DCCV discussed with patient and son, she agrees to procedure.  - Eventually would benefit from atrial fibrillation ablation (CASTLE-HF study).   Keep K >4.0 and Mg >2.0 - given body habitus, needs outpatient sleep study if no previous evaluation  3. Mitral regurgitation: Functional MR, moderate on TEE.  In NSR on cardiac MRI, MR was less impressive.  4. Diverticulitis: Afebrile, WBCs not elevated. Cefdinir and Flagyl course has been completed.  5. Hypothyroidism: on levothyroxine.  6. Hypokalemia: Supplement.  7. Hyperglycemia/Prediabetes: CBGs improving, in the 170s this am. Hgb A1c 04/2019 was 6.1  -management per IM   CRITICAL CARE Performed by: [name]  Total critical care time: 35 minutes  Critical care time was exclusive of separately billable procedures and treating other patients.  Critical care was necessary to treat or prevent imminent or life-threatening deterioration.  Critical care was time spent personally by me on the following activities: development of treatment plan with patient and/or surrogate as well as nursing, discussions with consultants, evaluation of patient's response to treatment, examination of patient, obtaining history from patient or surrogate, ordering and performing treatments and interventions, ordering and review of laboratory studies, ordering and review of radiographic  studies, pulse oximetry and re-evaluation of patient's condition.  [name] 06/21/2019 8:28 AM

## 2019-06-21 NOTE — Interval H&P Note (Signed)
History and Physical Interval Note:  06/21/2019 12:15 PM  Melinda Mckinney  has presented today for surgery, with the diagnosis of afib.  The various methods of treatment have been discussed with the patient and family. After consideration of risks, benefits and other options for treatment, the patient has consented to  Procedure(s): CARDIOVERSION (N/A) as a surgical intervention.  The patient's history has been reviewed, patient examined, no change in status, stable for surgery.  I have reviewed the patient's chart and labs.  Questions were answered to the patient's satisfaction.     [place] [employer]

## 2019-06-21 NOTE — Anesthesia Preprocedure Evaluation (Signed)
Anesthesia Evaluation  Patient identified by MRN, date of birth, ID band Patient awake    Reviewed: Allergy & Precautions, NPO status , Patient's Chart, lab work & pertinent test results  Airway Mallampati: I  TM Distance: >3 FB Neck ROM: Full    Dental   Pulmonary asthma , former smoker,    Pulmonary exam normal        Cardiovascular hypertension, + CAD  Normal cardiovascular exam     Neuro/Psych Anxiety Depression    GI/Hepatic GERD  Medicated and Controlled,  Endo/Other    Renal/GU      Musculoskeletal   Abdominal   Peds  Hematology   Anesthesia Other Findings   Reproductive/Obstetrics                             Anesthesia Physical Anesthesia Plan  ASA: III  Anesthesia Plan: General   Post-op Pain Management:    Induction: Intravenous  PONV Risk Score and Plan: Treatment may vary due to age or medical condition  Airway Management Planned: Mask  Additional Equipment:   Intra-op Plan:   Post-operative Plan:   Informed Consent: I have reviewed the patients History and Physical, chart, labs and discussed the procedure including the risks, benefits and alternatives for the proposed anesthesia with the patient or authorized representative who has indicated his/her understanding and acceptance.       Plan Discussed with: CRNA and Surgeon  Anesthesia Plan Comments:         Anesthesia Quick Evaluation

## 2019-06-21 NOTE — Anesthesia Procedure Notes (Signed)
Procedure Name: General with mask airway Date/Time: 06/21/2019 12:17 PM Performed by: Griffin Dakin, CRNA Oxygen Delivery Method: Ambu bag

## 2019-06-22 LAB — CBC
HCT: 40.2 % (ref 36.0–46.0)
Hemoglobin: 12 g/dL (ref 12.0–15.0)
MCH: 26.7 pg (ref 26.0–34.0)
MCHC: 29.9 g/dL — ABNORMAL LOW (ref 30.0–36.0)
MCV: 89.3 fL (ref 80.0–100.0)
Platelets: 354 10*3/uL (ref 150–400)
RBC: 4.5 MIL/uL (ref 3.87–5.11)
RDW: 16.1 % — ABNORMAL HIGH (ref 11.5–15.5)
WBC: 7.7 10*3/uL (ref 4.0–10.5)
nRBC: 0 % (ref 0.0–0.2)

## 2019-06-22 LAB — COOXEMETRY PANEL
Carboxyhemoglobin: 1.4 % (ref 0.5–1.5)
Methemoglobin: 0.6 % (ref 0.0–1.5)
O2 Saturation: 64.3 %
Total hemoglobin: 11.3 g/dL — ABNORMAL LOW (ref 12.0–16.0)

## 2019-06-22 LAB — BASIC METABOLIC PANEL
Anion gap: 10 (ref 5–15)
BUN: <5 mg/dL — ABNORMAL LOW (ref 8–23)
CO2: 23 mmol/L (ref 22–32)
Calcium: 6.9 mg/dL — ABNORMAL LOW (ref 8.9–10.3)
Chloride: 102 mmol/L (ref 98–111)
Creatinine, Ser: 0.71 mg/dL (ref 0.44–1.00)
GFR calc Af Amer: >60 mL/min (ref >60)
GFR calc non Af Amer: >60 mL/min (ref >60)
Glucose, Bld: 158 mg/dL — ABNORMAL HIGH (ref 70–99)
Potassium: 3 mmol/L — ABNORMAL LOW (ref 3.5–5.1)
Sodium: 135 mmol/L (ref 135–145)

## 2019-06-22 LAB — GLUCOSE, CAPILLARY
Glucose-Capillary: 117 mg/dL — ABNORMAL HIGH (ref 70–99)
Glucose-Capillary: 139 mg/dL — ABNORMAL HIGH (ref 70–99)
Glucose-Capillary: 126 mg/dL — ABNORMAL HIGH (ref 70–99)
Glucose-Capillary: 136 mg/dL — ABNORMAL HIGH (ref 70–99)
Glucose-Capillary: 147 mg/dL — ABNORMAL HIGH (ref 70–99)

## 2019-06-22 LAB — MAGNESIUM: Magnesium: 3.2 mg/dL — ABNORMAL HIGH (ref 1.7–2.4)

## 2019-06-22 MED ORDER — AMIODARONE LOAD VIA INFUSION
150.0000 mg | Freq: Once | INTRAVENOUS | Status: AC
  Administered 2019-06-22: 150 mg via INTRAVENOUS
  Filled 2019-06-22: qty 83.34

## 2019-06-22 MED ORDER — TEMAZEPAM 15 MG PO CAPS
15.0000 mg | ORAL_CAPSULE | Freq: Every evening | ORAL | Status: DC | PRN
  Administered 2019-06-22 – 2019-06-25 (×4): 15 mg via ORAL
  Filled 2019-06-22 (×4): qty 1

## 2019-06-22 MED ORDER — POTASSIUM CHLORIDE CRYS ER 20 MEQ PO TBCR
40.0000 meq | EXTENDED_RELEASE_TABLET | Freq: Once | ORAL | Status: AC
  Administered 2019-06-22: 40 meq via ORAL
  Filled 2019-06-22: qty 2

## 2019-06-22 MED ORDER — POTASSIUM CHLORIDE 10 MEQ/50ML IV SOLN
10.0000 meq | INTRAVENOUS | Status: AC
  Administered 2019-06-22 (×2): 10 meq via INTRAVENOUS
  Filled 2019-06-22 (×2): qty 50

## 2019-06-22 NOTE — Plan of Care (Signed)
  Problem: Education: Goal: Knowledge of General Education information will improve Description: Including pain rating scale, medication(s)/side effects and non-pharmacologic comfort measures Outcome: Progressing   Problem: Health Behavior/Discharge Planning: Goal: Ability to manage health-related needs will improve Outcome: Progressing   Problem: Clinical Measurements: Goal: Ability to maintain clinical measurements within normal limits will improve Outcome: Not Progressing Goal: Will remain free from infection Outcome: Progressing Goal: Diagnostic test results will improve Outcome: Progressing Goal: Respiratory complications will improve Outcome: Progressing Goal: Cardiovascular complication will be avoided Outcome: Not Progressing   Problem: Activity: Goal: Risk for activity intolerance will decrease Outcome: Progressing   Problem: Nutrition: Goal: Adequate nutrition will be maintained Outcome: Progressing   Problem: Coping: Goal: Level of anxiety will decrease Outcome: Progressing   Problem: Elimination: Goal: Will not experience complications related to bowel motility Outcome: Progressing Goal: Will not experience complications related to urinary retention Outcome: Progressing   Problem: Pain Managment: Goal: General experience of comfort will improve Outcome: Progressing   Problem: Skin Integrity: Goal: Risk for impaired skin integrity will decrease Outcome: Progressing   Problem: Education: Goal: Ability to demonstrate management of disease process will improve Outcome: Progressing Goal: Ability to verbalize understanding of medication therapies will improve Outcome: Progressing Goal: Individualized Educational Video(s) Outcome: Progressing   Problem: Activity: Goal: Capacity to carry out activities will improve Outcome: Progressing   Problem: Cardiac: Goal: Ability to achieve and maintain adequate cardiopulmonary perfusion will improve Outcome:  Progressing

## 2019-06-22 NOTE — Progress Notes (Signed)
Pt continues with HR in 140-150's. Dr. Haroldine Laws notified. Verbal order for Amio bolus and to repeat if this does not work. Will administer and monitor closely.  Lucius Conn, RN

## 2019-06-22 NOTE — Progress Notes (Signed)
PROGRESS NOTE  Melinda Mckinney [id number] DOB: [birth year] DOA: 06/11/2019 PCP: Colon Branch, MD  HPI and interval History: Melinda Mckinney is a 63 y.o. female with medical history significant of DM2, A.Fib on xarelto, CAD s/p stent.  Patient originally diagnosed with COVID19 on 9/15.  Admitted with gastroenteritis due to COVID 10/9-10/13.  Developed worsening abd pain later in the month, CT abd/pelvis on 10/22 showed new acute diverticulitis not present on CT 10/9. She was put on cipro/flagyl.  She continued to have left lower quadrant abdominal pain so came back to the emergency room. In the emergency room, repeat CT scan of the abdomen pelvis showed improvement of diverticulitis.  Blood pressure was soft.  Lactate was 2.7.  Started on antibiotics.  Found to be on A. fib with RVR and treated with Cardizem drip. Echocardiogram showed severely reduced ejection fraction to 25%.  Underwent cardiac catheterization with normal coronaries.  Patient was started on milrinone, amiodarone and Lasix and admitted to progressive care unit.  06/19/2019: Remains persistent A. fib with RVR, underwent TEE and cardioversion with successful conversion to sinus rhythm and remained on amiodarone drip.  EF is 15%. 06/21/2019: Patient went back to rapid A. fib still on amnio drip.  Underwent another TEE cardioversion with success , again went into rapid Afib overnight.   Subjective: Patient seen and examined in the morning. She was tearful and with family on the phone.  Patient herself did not have any symptoms, however went back into RVR overnight. .  Potassium was low that was replaced. BP remains soft. Remains on amio.  Assessment/Plan: Principal Problem:   Sepsis ([employer]) Active Problems:   Essential hypertension   Coronary artery disease involving native coronary artery of native heart with angina pectoris (HCC)   Atrial fibrillation with rapid ventricular response (HCC)   Chronic systolic CHF (congestive heart  failure) ([place])   COVID-19   Diverticulitis  Paroxysmal atrial fibrillation with RVR Persistent and recurrent rapid A. fib, cardioversion on 06/19/2019 and repeat on 06/21/2019 but unsuccessful to maintain sinus rhythm. She may need ablation.  On amiodarone maintenance. On Xarelto for anticoagulation. As per cardiology.  Acute on chronic systolic CHF, nonischemic cardiomyopathy. Patient with reduced EF of less than 20%.  Previous EF of 45-50% from 2018. Unknown etiology. Recent Covid infection and currently in atrial fibrillation.  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  MRI with normal myocardium. Was on IV Lasix with good response and diuresing and now converted to torsemide and aldactone. Added losartan.  Blood pressures remain soft.  Acute sigmoid diverticulitis, resolved. Failed outpatient treatment with ciprofloxacin/flagyl.  Symptoms improved.   Already received prolonged treatment.   Finished therapy.  Symptoms improved.     Essential hypertension/soft blood pressures Blood pressure soft and stable.  Pre-diabetes No evidence of diabetes on chart review. Highest hemoglobin A1C is 6.1%. Recommend diet modification.  Hypothyroidism Recent TSH of 1.6 -Continue Synthroid  Recent COVID-19 pneumonia Symptoms improved.  She was using 2 L oxygen at home.  DVT prophylaxis: Xarelto Code Status:  Full Code Family Communication:  None Disposition Plan:  Pending clinical improvement.  Remains in progressive care unit.  Anticipate discharge home with home health care after clinical improvement.  Consultants:   Cardiology  Procedures:   11/1: Transthoracic Echocardiogram IMPRESSIONS   1. Left ventricular ejection fraction, by visual estimation, is 25 to 30%. The left ventricle has severely decreased function. There is no left ventricular hypertrophy. 2. Left ventricular diastolic parameters are indeterminate. 3.  The left ventricle  demonstrates global hypokinesis. 4. Global right ventricle has mildly reduced systolic function.The right ventricular size is normal. No increase in right ventricular wall thickness. 5. Left atrial size was moderately dilated. 6. Right atrial size was mildly dilated. 7. The mitral valve is normal in structure. Moderate mitral valve regurgitation. No evidence of mitral stenosis. 8. The tricuspid valve is normal in structure. Tricuspid valve regurgitation moderate. 9. The aortic valve is normal in structure. Aortic valve regurgitation is mild. No evidence of aortic valve sclerosis or stenosis. 10. The pulmonic valve was normal in structure. Pulmonic valve regurgitation is mild. 11. Aortic dilatation noted. 12. There is mild dilatation of the ascending aorta measuring 39 mm. 13. Mildly elevated pulmonary artery systolic pressure. 14. The inferior vena cava is normal in size with greater than 50% respiratory variability, suggesting right atrial pressure of 3 mmHg.  FINDINGS Left Ventricle: Left ventricular ejection fraction, by visual estimation, is 25 to 30%. The left ventricle has severely decreased function. The left ventricle demonstrates global hypokinesis. There is no left ventricular hypertrophy. Left ventricular  diastolic parameters are indeterminate. Normal left atrial pressure.  Right Ventricle: The right ventricular size is normal. No increase in right ventricular wall thickness. Global RV systolic function is has mildly reduced systolic function. The tricuspid regurgitant velocity is 2.46 m/s, and with an assumed right atrial  pressure of 8 mmHg, the estimated right ventricular systolic pressure is mildly elevated at 32.2 mmHg.  Left Atrium: Left atrial size was moderately dilated.  Right Atrium: Right atrial size was mildly dilated  Pericardium: There is no evidence of pericardial effusion.  Mitral Valve: The mitral valve is normal in structure. No evidence of mitral  valve stenosis by observation. Moderate mitral valve regurgitation.  Tricuspid Valve: The tricuspid valve is normal in structure. Tricuspid valve regurgitation moderate.  Aortic Valve: The aortic valve is normal in structure. Aortic valve regurgitation is mild. Aortic regurgitation PHT measures 835 msec. The aortic valve is structurally normal, with no evidence of sclerosis or stenosis.  Pulmonic Valve: The pulmonic valve was normal in structure. Pulmonic valve regurgitation is mild.  Aorta: Aortic dilatation noted. There is mild dilatation of the ascending aorta measuring 39 mm.  Venous: The inferior vena cava is normal in size with greater than 50% respiratory variability, suggesting right atrial pressure of 3 mmHg.  IAS/Shunts: No atrial level shunt detected by color flow Doppler. No ventricular septal defect is seen or detected. There is no evidence of an atrial septal defect.  Antimicrobials:  Ceftriaxone (10/27>> 06/20/2019  Flagyl (10/27>>  06/20/2019   Objective: Vitals:   06/22/19 0400 06/22/19 0500 06/22/19 0600 06/22/19 0745  BP: 116/88 (!) 108/58 101/80   Pulse: 78 78 (!) 33   Resp: 17 20 (!) 28   Temp:    98.4 F (36.9 C)  TempSrc:    Oral  SpO2: 97%  (!) 84%   Weight:  105.1 kg    Height:        Intake/Output Summary (Last 24 hours) at 06/22/2019 0913 Last data filed at 06/22/2019 0836 Gross per 24 hour  Intake 1070.32 ml  Output 2776 ml  Net -1705.68 ml   Filed Weights   06/20/19 0500 06/21/19 0500 06/22/19 0500  Weight: 107.2 kg 107.4 kg 105.1 kg    Exam:  Physical Exam  Constitutional: She is oriented to person, place, and time and well-developed, well-nourished, and in no distress. No distress.  Neck: Normal range of motion. Neck supple. No  thyromegaly present.  Cardiovascular:  She was tachycardic with irregularly irregular rhythm.  No murmurs.  Pulmonary/Chest: Effort normal and breath sounds normal. No respiratory distress.  Abdominal:  Bowel sounds are normal.  Musculoskeletal:        General: No deformity or edema.  Neurological: She is alert and oriented to person, place, and time.  Psychiatric: Affect normal.  Anxious and tearful.   Data Reviewed: CBC: Recent Labs  Lab 06/18/19 0223  06/19/19 0241 06/19/19 0348 06/20/19 0445 06/21/19 0405 06/22/19 0353  WBC 7.2  --  7.6  --  6.4 7.6 7.7  HGB 10.5*   < > 11.2* 12.2 10.8* 11.6* 12.0  HCT 34.8*   < > 36.7 36.0 35.6* 38.9 40.2  MCV 91.1  --  89.1  --  90.1 90.9 89.3  PLT 284  --  322  --  285 364 354   < > = values in this interval not displayed.   Basic Metabolic Panel: Recent Labs  Lab 06/18/19 2237  06/19/19 0241 06/19/19 0348 06/20/19 0445 06/21/19 0405 06/22/19 0353  NA 134*   < > 134* 136 136 134* 135  K 3.4*   < > 4.0 4.0 3.9 3.7 3.0*  CL 94*  --  92*  --  97* 96* 102  CO2 29  --  28  --  27 26 23   GLUCOSE 270*  --  273*  --  177* 204* 158*  BUN 5*  --  6*  --  7* <5* <5*  CREATININE 0.81  --  0.78  --  0.78 0.77 0.71  CALCIUM 8.2*  --  8.3*  --  8.5* 8.6* 6.9*  MG 2.2  --  2.0  --  1.9 1.9 3.2*   < > = values in this interval not displayed.   GFR: Estimated Creatinine Clearance: 84.3 mL/min (by C-G formula based on SCr of 0.71 mg/dL). Liver Function Tests: No results for input(s): AST, ALT, ALKPHOS, BILITOT, PROT, ALBUMIN in the last 168 hours. No results for input(s): LIPASE, AMYLASE in the last 168 hours. No results for input(s): AMMONIA in the last 168 hours. Coagulation Profile: No results for input(s): INR, PROTIME in the last 168 hours. Cardiac Enzymes: No results for input(s): CKTOTAL, CKMB, CKMBINDEX, TROPONINI in the last 168 hours. BNP (last 3 results) No results for input(s): PROBNP in the last 8760 hours. HbA1C: No results for input(s): HGBA1C in the last 72 hours. CBG: Recent Labs  Lab 06/21/19 1524 06/21/19 2000 06/21/19 2320 06/22/19 0410 06/22/19 0746  GLUCAP 183* 180* 121* 117* 139*   Lipid Profile: No  results for input(s): CHOL, HDL, LDLCALC, TRIG, CHOLHDL, LDLDIRECT in the last 72 hours. Thyroid Function Tests: No results for input(s): TSH, T4TOTAL, FREET4, T3FREE, THYROIDAB in the last 72 hours. Anemia Panel: No results for input(s): VITAMINB12, FOLATE, FERRITIN, TIBC, IRON, RETICCTPCT in the last 72 hours. Urine analysis:    Component Value Date/Time   COLORURINE AMBER (A) 06/11/2019 1526   APPEARANCEUR CLEAR 06/11/2019 1526   LABSPEC >1.046 (H) 06/11/2019 1526   PHURINE 6.0 06/11/2019 1526   GLUCOSEU NEGATIVE 06/11/2019 1526   GLUCOSEU NEGATIVE 06/06/2019 1538   HGBUR SMALL (A) 06/11/2019 1526   BILIRUBINUR NEGATIVE 06/11/2019 1526   KETONESUR NEGATIVE 06/11/2019 1526   PROTEINUR 30 (A) 06/11/2019 1526   UROBILINOGEN 0.2 06/06/2019 1538   NITRITE NEGATIVE 06/11/2019 1526   LEUKOCYTESUR TRACE (A) 06/11/2019 1526   Sepsis Labs: @LABRCNTIP (procalcitonin:4,lacticidven:4)  ) No results found for this or  any previous visit (from the past 240 hour(s)).    Studies: No results found.  Scheduled Meds: . allopurinol  100 mg Oral QHS  . amiodarone  150 mg Intravenous Once  . atorvastatin  40 mg Oral QHS  . Chlorhexidine Gluconate Cloth  6 each Topical Daily  . digoxin  0.125 mg Oral Daily  . DULoxetine  60 mg Oral Daily  . Gerhardt's butt cream   Topical QID  . insulin aspart  0-9 Units Subcutaneous Q4H  . levothyroxine  125 mcg Oral QAC breakfast  . losartan  12.5 mg Oral BID  . magic mouthwash  15 mL Oral QID  . nystatin   Topical BID  . pantoprazole  40 mg Oral BID  . raloxifene  60 mg Oral QHS  . rivaroxaban  20 mg Oral Daily  . sodium chloride flush  10-40 mL Intracatheter Q12H  . sodium chloride flush  10-40 mL Intracatheter Q12H  . sodium chloride flush  3 mL Intravenous Q12H  . spironolactone  25 mg Oral Daily  . torsemide  40 mg Oral Daily    Continuous Infusions: . sodium chloride 0 mL (06/19/19 1306)  . sodium chloride    . amiodarone 60 mg/hr (06/22/19  0600)     LOS: 11 days   Total time spent: 30 minutes.

## 2019-06-22 NOTE — Progress Notes (Signed)
Patient ID: Melinda Mckinney, female   DOB: 1956-02-01, 63 y.o.   MRN: [id number]     Advanced Heart Failure Rounding Note  PCP-Cardiologist: Skeet Latch, MD   Subjective:    Echo 06/16/19 with severely reduced EF, 25-30%. LHC 11/2 w/ normal cors. RHC w/ high filling pressures and low CO.   Started on milrinone 11/2 for low CO. CI 1.6 on RHC. Initial Co-ox was 60%. Hospital course c/b afib w/ RVR. Milrinone discontinued 11/4. On digoxin. Co-ox good off of milrinone at 76.3%.   S/p TEE/DCCV 11/4. Cardioverted 4 times, using sternal pressure after the first attempt.   Cardioverted at [place]. She remained in NSR until 11/5, then back to afib/RVR with HR 140s. Cardioverted again 11/6. Went back to AF overnight.   Remains in AF with RVR with rates in 140s. Got 2 boluses IV amio this am and rate still 140s.   Co-ox stable at 64%. K low at 3.0 CVP 5-6 today  Denies CP, SOB, palpitations, orthopnea or PND.   IM managing diverticulitis, now off antibiotics and no more abdominal pain.    - TEE: EF 15% (in setting of rapid afib), moderate LV dilation, moderately dilated/dysfunctional RV, moderate MR.  - Cardiac MRI: LV EF 34%, RV EF 30%, no LGE.    Objective:   Weight Range: 105.1 kg Body mass index is 38.56 kg/m.   Vital Signs:   Temp:  [96.3 F (35.7 C)-98.9 F (37.2 C)] 98.5 F (36.9 C) (11/07 1134) Pulse Rate:  [33-149] 149 (11/07 0925) Resp:  [14-28] 28 (11/07 1000) BP: (92-117)/(58-94) 92/74 (11/07 0900) SpO2:  [84 %-100 %] 97 % (11/07 0900) Weight:  [105.1 kg] 105.1 kg (11/07 0500) Last BM Date: 06/21/19  Weight change: Filed Weights   06/20/19 0500 06/21/19 0500 06/22/19 0500  Weight: 107.2 kg 107.4 kg 105.1 kg    Intake/Output:   Intake/Output Summary (Last 24 hours) at 06/22/2019 1200 Last data filed at 06/22/2019 1000 Gross per 24 hour  Intake 1321.39 ml  Output 2476 ml  Net -1154.61 ml      Physical Exam   CVP 5-6 General:  Sitting up in bed. No resp  difficulty HEENT: normal Neck: supple. no JVD. Carotids 2+ bilat; no bruits. No lymphadenopathy or thryomegaly appreciated. Cor: PMI nondisplaced. Irregular tachy No rubs, gallops or murmurs. Lungs: clear Abdomen: obese soft, nontender, nondistended. No hepatosplenomegaly. No bruits or masses. Good bowel sounds. Extremities: no cyanosis, clubbing, rash, edema Neuro: alert & orientedx3, cranial nerves grossly intact. moves all 4 extremities w/o difficulty. Affect pleasant    Telemetry   Atrial fibrillation 140s  EKG    N/A  Labs    CBC Recent Labs    06/21/19 0405 06/22/19 0353  WBC 7.6 7.7  HGB 11.6* 12.0  HCT 38.9 40.2  MCV 90.9 89.3  PLT 364 [id number]   Basic Metabolic Panel Recent Labs    06/21/19 0405 06/22/19 0353  NA 134* 135  K 3.7 3.0*  CL 96* 102  CO2 26 23  GLUCOSE 204* 158*  BUN <5* <5*  CREATININE 0.77 0.71  CALCIUM 8.6* 6.9*  MG 1.9 3.2*   Liver Function Tests No results for input(s): AST, ALT, ALKPHOS, BILITOT, PROT, ALBUMIN in the last 72 hours. No results for input(s): LIPASE, AMYLASE in the last 72 hours. Cardiac Enzymes No results for input(s): CKTOTAL, CKMB, CKMBINDEX, TROPONINI in the last 72 hours.  BNP: BNP (last 3 results) Recent Labs    05/26/19 0720 05/27/19 0245 05/28/19 0630  BNP 604.0* 330.7* 345.9*    ProBNP (last 3 results) No results for input(s): PROBNP in the last 8760 hours.   D-Dimer No results for input(s): DDIMER in the last 72 hours. Hemoglobin A1C No results for input(s): HGBA1C in the last 72 hours. Fasting Lipid Panel No results for input(s): CHOL, HDL, LDLCALC, TRIG, CHOLHDL, LDLDIRECT in the last 72 hours. Thyroid Function Tests No results for input(s): TSH, T4TOTAL, T3FREE, THYROIDAB in the last 72 hours.  Invalid input(s): FREET3  Other results:   Imaging    No results found.   Medications:     Scheduled Medications: . allopurinol  100 mg Oral QHS  . atorvastatin  40 mg Oral QHS  .  Chlorhexidine Gluconate Cloth  6 each Topical Daily  . digoxin  0.125 mg Oral Daily  . DULoxetine  60 mg Oral Daily  . Gerhardt's butt cream   Topical QID  . insulin aspart  0-9 Units Subcutaneous Q4H  . levothyroxine  125 mcg Oral QAC breakfast  . losartan  12.5 mg Oral BID  . magic mouthwash  15 mL Oral QID  . nystatin   Topical BID  . pantoprazole  40 mg Oral BID  . raloxifene  60 mg Oral QHS  . rivaroxaban  20 mg Oral Daily  . sodium chloride flush  10-40 mL Intracatheter Q12H  . sodium chloride flush  10-40 mL Intracatheter Q12H  . sodium chloride flush  3 mL Intravenous Q12H  . spironolactone  25 mg Oral Daily  . torsemide  40 mg Oral Daily    Infusions: . sodium chloride 0 mL (06/19/19 1306)  . sodium chloride    . amiodarone 60 mg/hr (06/22/19 1000)    PRN Medications: sodium chloride, acetaminophen, cyclobenzaprine, HYDROmorphone (DILAUDID) injection, morphine injection, ondansetron (ZOFRAN) IV, ondansetron **OR** [DISCONTINUED] ondansetron (ZOFRAN) IV, sodium chloride flush, sodium chloride flush, sodium chloride flush, zolpidem    Patient Profile   63 y/o female w/ h/o CAD s/p PCI in 2010, chronic combined systolic and diastolic HF (EF [id number] in 2018), atrial fibrillation on chronic a/c w/ Xarelto, HTN, prediabetes, hypothyroidism and diagnosed w/ COVID 19 in Sep 2020 and was treated at [place] infection c/b PNA and gastroenteritis. Admitted to [facility] 10/27 for diverticulitis and afib w/ RVR and found to have worsening systolic function w/ EF now at 25-30%. Subsequent LHC showed widely patent coronaries. RHC c/w severely elevated filling pressures and low CO.   Assessment/Plan   1. Acute systolic CHF:  Nonischemic cardiomyopathy.  Echo in 2018 with EF 45-50%.  Echo this admission with EF 25-30%, diffuse hypokinesis, mildly decreased RV systolic function, moderate MR. LHC/RHC this admission with no significant coronary disease, markedly elevated filling  pressures and low cardiac output (CI 1.6).  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 Cardiac MRI 11/5 showed LVEF 34%, RVEF 30%, no LGE => perhaps pointing to tachy-mediated CMP as more likely etiology.  SCr 0.77 (down from 2.3 3 wks ago). Initially required inotropic support w/ milrinone but discontinued 11/4. Co-ox 64% this morning, CVP 5-6 - Continue torsemide 40 daily - Continue losartan 12.5 mg bid.    - Continue spironolactone to 25 daily.  - Continue digoxin 0.125 daily, level ok  2. Atrial fibrillation with RVR: Persistent atrial fibrillation, it appears, at least since 9/20 when she was admitted with COVID-19. Prior, she had paroxysmal atrial fibrillation and was maintained on sotalol. She is  now off sotalol.  Afib has been difficult to rate control, initially had DCCV 11/4 back to NSR.  Went back to afib/RVR in 140s on 11/5. Recardioverted on 11/6. Now back in AF with RVR again. Unable to get rate down despite amiodarone gtt.    - On amiodarone 60 mg/hr, continue.  - Continue Xarelto 20 mg daily   - I will bolus again with IV amio and add Ranexa.  - I have talked to Dr. Rayann Heman in EP (who has seen her as an outpatient). He will see this weekend to consider options of repeat DC-CV vs AVN ablation/CRT or AF ablation. I discussed this at length with her, her husband and her son Pilar Plate).  - Keep K >4.0 and Mg >2.0 - given body habitus, needs outpatient sleep study if no previous evaluation  3. Mitral regurgitation: Functional MR, moderate on TEE.  In NSR on cardiac MRI, MR was less impressive.  4. Diverticulitis: Afebrile, WBCs not elevated. Cefdinir and Flagyl course has been completed.  5. Hypothyroidism: on levothyroxine.  6. Hypokalemia: K 3.0 Wil supplement. Discussed dosing with [name] personally. 7. Hyperglycemia/Prediabetes: CBGs improving. Hgb A1c 04/2019 was 6.1  -management per IM   She is  hemodynamically stable despite AF with RVR. Can go to [facility].   Glori Bickers MD 06/22/2019 12:00 PM

## 2019-06-23 ENCOUNTER — Encounter (HOSPITAL_COMMUNITY): Payer: Self-pay | Admitting: Cardiology

## 2019-06-23 LAB — BASIC METABOLIC PANEL
Anion gap: 13 (ref 5–15)
BUN: 6 mg/dL — ABNORMAL LOW (ref 8–23)
CO2: 28 mmol/L (ref 22–32)
Calcium: 8.9 mg/dL (ref 8.9–10.3)
Chloride: 95 mmol/L — ABNORMAL LOW (ref 98–111)
Creatinine, Ser: 0.95 mg/dL (ref 0.44–1.00)
GFR calc Af Amer: >60 mL/min (ref >60)
GFR calc non Af Amer: >60 mL/min (ref >60)
Glucose, Bld: 132 mg/dL — ABNORMAL HIGH (ref 70–99)
Potassium: 3.7 mmol/L (ref 3.5–5.1)
Sodium: 136 mmol/L (ref 135–145)

## 2019-06-23 LAB — CBC
HCT: 42.1 % (ref 36.0–46.0)
Hemoglobin: 12.9 g/dL (ref 12.0–15.0)
MCH: 27.1 pg (ref 26.0–34.0)
MCHC: 30.6 g/dL (ref 30.0–36.0)
MCV: 88.4 fL (ref 80.0–100.0)
Platelets: 470 10*3/uL — ABNORMAL HIGH (ref 150–400)
RBC: 4.76 MIL/uL (ref 3.87–5.11)
RDW: 16.3 % — ABNORMAL HIGH (ref 11.5–15.5)
WBC: 7.5 10*3/uL (ref 4.0–10.5)
nRBC: 0 % (ref 0.0–0.2)

## 2019-06-23 LAB — GLUCOSE, CAPILLARY
Glucose-Capillary: 145 mg/dL — ABNORMAL HIGH (ref 70–99)
Glucose-Capillary: 146 mg/dL — ABNORMAL HIGH (ref 70–99)
Glucose-Capillary: 129 mg/dL — ABNORMAL HIGH (ref 70–99)

## 2019-06-23 LAB — SURGICAL PCR SCREEN
MRSA, PCR: NEGATIVE
Staphylococcus aureus: NEGATIVE

## 2019-06-23 MED ORDER — RANOLAZINE ER 500 MG PO TB12
500.0000 mg | ORAL_TABLET | Freq: Two times a day (BID) | ORAL | Status: DC
  Administered 2019-06-23: 500 mg via ORAL
  Filled 2019-06-23 (×2): qty 1

## 2019-06-23 NOTE — Consult Note (Signed)
ELECTROPHYSIOLOGY CONSULT NOTE    Primary Care Physician: Colon Branch, MD Referring Physician:  Dr Haroldine Laws  Admit Date: 06/11/2019  Reason for consultation:  Refractory afib  Melinda Mckinney is a 63 y.o. female who is well known to me.  She has a h/o paroxysmal atrial fibrillation and had done very well with sotalol.  She has had a very difficult course over the past few months with COVID in September followed by pneumonia and subsequently acute diverticulitis.  She has since developed recurrent and more persistent afib.  V rates have been very elevated.  She has developed worsening EF (25%) with acute decompensated CHF.  She has been evaluated by Dr Aundra Dubin.  She was started on amiodarone and subsequently cardioverted without success.  She remains on IV amiodarone.   She had cath this admission which revealed low CO with elevated RH pressures.  Cors were widely patent.  Her decompensation is felt to largely be due to AF with RVR.  She has required milrinone therapy. Today, she denies symptoms of palpitations, chest pain, shortness of breath, orthopnea, PND, lower extremity edema, dizziness, presyncope, syncope, or neurologic sequela. The patient is tolerating medications without difficulties and is otherwise without complaint today.   Past Medical History:  Diagnosis Date  . Anemia   . Anxiety and depression   . Asthma   . Atrial fibrillation ([place]) 07/2017  . Borderline diabetes   . CAD (coronary artery disease) ~2009   stent   . Cervical cancer ([place])    "stage 1; had cryotherapy"  . Chronic lower back pain   . DDD (degenerative disc disease), lumbar    Dr. Nelva Bush, recommends lumbar epidural steroid injections L5-S1 to the right  . Depression   . Fibromyalgia   . GERD (gastroesophageal reflux disease)   . High cholesterol   . History of hiatal hernia   . History of kidney stones   . Hypertension   . Hypothyroidism   . Osteoporosis   . Thyroid disease    Past Surgical History:   Procedure Laterality Date  . CARDIAC CATHETERIZATION  ~ 2008  . CARDIOVERSION N/A 08/05/2017   Procedure: CARDIOVERSION;  Surgeon: Evans Lance, MD;  Location: [facility];  Service: Cardiovascular;  Laterality: N/A;  . CARDIOVERSION N/A 08/24/2017   Procedure: CARDIOVERSION;  Surgeon: Sanda Klein, MD;  Location: [facility];  Service: Cardiovascular;  Laterality: N/A;  . CARDIOVERSION N/A 06/19/2019   Procedure: CARDIOVERSION;  Surgeon: Larey Dresser, MD;  Location: [facility];  Service: Cardiovascular;  Laterality: N/A;  . [place]; ~ 1978/1979; 1984  . ESOPHAGOMYOTOMY  2009  . GYNECOLOGIC CRYOSURGERY  X 2   "stage 1 cancer"  . HERNIA REPAIR  X 6   "all in my stomach" (07/27/2017)  . KNEE ARTHROSCOPY Right   . NEPHRECTOMY Right 1991   in [place], due to fibrosis and lithiasis  . RIGHT/LEFT HEART CATH AND CORONARY ANGIOGRAPHY N/A 06/17/2019   Procedure: RIGHT/LEFT HEART CATH AND CORONARY ANGIOGRAPHY;  Surgeon: Lorretta Harp, MD;  Location: [facility];  Service: Cardiovascular;  Laterality: N/A;  . TEE WITHOUT CARDIOVERSION N/A 06/19/2019   Procedure: TRANSESOPHAGEAL ECHOCARDIOGRAM (TEE);  Surgeon: Larey Dresser, MD;  Location: [facility];  Service: Cardiovascular;  Laterality: N/A;  . TUBAL LIGATION      . allopurinol  100 mg Oral QHS  . atorvastatin  40 mg Oral QHS  . Chlorhexidine Gluconate Cloth  6 each Topical Daily  . digoxin  0.125 mg Oral Daily  . DULoxetine  60 mg Oral Daily  . Gerhardt's butt cream   Topical QID  . insulin aspart  0-9 Units Subcutaneous Q4H  . levothyroxine  125 mcg Oral QAC breakfast  . losartan  12.5 mg Oral BID  . magic mouthwash  15 mL Oral QID  . nystatin   Topical BID  . pantoprazole  40 mg Oral BID  . raloxifene  60 mg Oral QHS  . rivaroxaban  20 mg Oral Daily  . sodium chloride flush  10-40 mL Intracatheter Q12H  . sodium chloride flush  10-40 mL Intracatheter Q12H  . sodium chloride flush  3 mL Intravenous Q12H  .  spironolactone  25 mg Oral Daily  . torsemide  40 mg Oral Daily   . sodium chloride 0 mL (06/19/19 1306)  . sodium chloride    . amiodarone 60 mg/hr (06/23/19 0800)    Allergies  Allergen Reactions  . Penicillins Swelling and Rash    Has patient had a PCN reaction causing immediate rash, facial/tongue/throat swelling, SOB or lightheadedness with hypotension: No Has patient had a PCN reaction causing severe rash involving mucus membranes or skin necrosis: No Has patient had a PCN reaction that required hospitalization: No Has patient had a PCN reaction occurring within the last 10 years: No If all of the above answers are "NO", then may proceed with Cephalosporin use.    Social History   Socioeconomic History  . Marital status: Married    Spouse name: Not on file  . Number of children: 3  . Years of education: Not on file  . Highest education level: Not on file  Occupational History  . 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 Financial resource strain: Not on file  . Food insecurity    Worry: Not on file    Inability: Not on file  . Transportation needs    Medical: Not on file    Non-medical: Not on file  Tobacco Use  . Smoking status: Former Smoker    Packs/day: 2.00    Years: 28.00    Pack years: 56.00    Types: Cigarettes    Quit date: 2000    Years since quitting: 20.8  . Smokeless tobacco: Never Used  Substance and Sexual Activity  . Alcohol use: Yes    Comment: 07/27/2017 "3-4 drinks//month"  . Drug use: No  . Sexual activity: Not Currently    Comment: 1ST INTERCOURSE- 16, PARTNERS - 2  Lifestyle  . Physical activity    Days per week: Not on file    Minutes per session: Not on file  . Stress: Not on file  Relationships  . Social Herbalist on phone: Not on file    Gets together: Not on file    Attends religious service: Not on file    Active member of club or organization: Not on file    Attends meetings of clubs or organizations: Not on  file    Relationship status: Not on file  . Intimate partner violence    Fear of current or ex partner: Not on file    Emotionally abused: Not on file    Physically abused: Not on file    Forced sexual activity: Not on file  Other Topics Concern  . Not on file  Social History Narrative   Original from [place]   3 children, 2 alive   Household --pt and husband  Family History  Problem Relation Age of Onset  . Breast cancer Other        aunt   . CAD Brother   . Lung cancer Mother        F and M  . Diabetes Father        F and mother   . CAD Father   . Lung cancer Father   . Stroke Sister   . Colon cancer Neg Hx     ROS- All systems are reviewed and negative except as per the HPI above  Physical Exam: Telemetry:  Afib, V rates frequent 120s-130s Vitals:   06/23/19 0400 06/23/19 0500 06/23/19 0800 06/23/19 0910  BP: 116/78   105/78  Pulse:  69 (!) 121 (!) 122  Resp:  (!) 23 (!) 24 (!) 23  Temp:  98.7 F (37.1 C)  98.5 F (36.9 C)  TempSrc:  Oral  Oral  SpO2:  96% 96% 96%  Weight:      Height:        GEN- The patient is overweight appearing, alert and oriented x 3 today.   Head- normocephalic, atraumatic Eyes-  Sclera clear, conjunctiva pink Ears- hearing intact Oropharynx- clear Neck- supple,  Lungs- normal work of breathing Heart- tachycardic irregular rhythm GI- soft  Extremities- no clubbing, cyanosis, or edema Skin- no rash or lesion Psych- euthymic mood, full affect Neuro- strength and sensation are intact  EKGs since 04/30/2019 consistently show that she has been in afib  Labs:   Lab Results  Component Value Date   WBC 7.7 06/22/2019   HGB 12.0 06/22/2019   HCT 40.2 06/22/2019   MCV 89.3 06/22/2019   PLT 354 06/22/2019    Recent Labs  Lab 06/23/19 0605  NA 136  K 3.7  CL 95*  CO2 28  BUN 6*  CREATININE 0.95  CALCIUM 8.9  GLUCOSE 132*     Radiology: Cardiac MRI 06/20/2019- EF 35% with no LGE, diffuse HIK, moderate LA  enlargement, mild to moderate MR  Echo: 06/16/2019- EF 25%, LA 70mm  ASSESSMENT AND PLAN:   1. afib with RVR/ acute decompensated systolic dysfunction The patient has medicine refractory afib.  She has been persistently in afib since at least September.  This was likely seconadry to her COVID, pneumonia, diverticulitis events initially.  She has at least moderate LA enlargement and appears to have remodeled structurally to prefer afib.  She has failed medical therapy with sotalol and amiodarone. I worry that our ability to maintain sinus rhythm is low. Anticipated success with PVI would be 60-65% and may require multiple procedures.  In addition, the likelihood of ERAF over the next 3 months would be high.  I also worry about the risks of this procedure given her acute decompensation.  Unfortunately, V rates are very difficult to manage.   I have discussed at length with Dr Haroldine Laws today.  I think that the best next step is CRT-P with AV nodal ablation.  Given no LGE on MRI, I do think that her EF will recover with rate control and therefore do not think that ICD is required at this time. I have had a thorough discussion with the patient reviewing options.  I also discussed with her son by phone yesterday.  The patient has had opportunities to ask questions and have them answered. The patient and I have decided together through a shared decision making process to proceed with CRT-P and AV nodal ablation.   Risks, benefits, alternatives to  CRT-P with AV nodal ablation were discussed in detail with the patient today. The patient understands that the risks include but are not limited to bleeding, infection, pneumothorax, perforation, tamponade, vascular damage, renal failure, MI, stroke, death,  and lead dislodgement and wishes to proceed.  We will therefore schedule device implantation at the next available time. I have tentatively scheduled with Dr Lovena Le for tomorrow.  Continue current medicines,  including xarelto in the interim.  Thompson Grayer, MD 06/23/2019  9:41 AM

## 2019-06-23 NOTE — Progress Notes (Signed)
Patient ID: Melinda Mckinney, female   DOB: 02-06-1956, 63 y.o.   MRN: [id number]     Advanced Heart Failure Rounding Note  PCP-Cardiologist: Skeet Latch, MD   Subjective:    Echo 06/16/19 with severely reduced EF, 25-30% (ehcho 2018 EF 45-50%). LHC 11/2 w/ normal cors. RHC w/ high filling pressures and low CO.   Started on milrinone 11/2 for low CO. CI 1.6 on RHC. Initial Co-ox was 60%. Hospital course c/b afib w/ RVR. Milrinone discontinued 11/4. On digoxin. Co-ox good off of milrinone at 76.3%.   S/p TEE/DCCV 11/4. Cardioverted 4 times, using sternal pressure after the first attempt.   Cardioverted at [place]. She remained in NSR until 11/5, then back to afib/RVR with HR 140s. Cardioverted again 11/6. Went back to AF overnight on 11/6  Remains in AF with RVR with rates in 140s despite several boluses of amio. Remains asx this am. Denies CP, SOB, palpitations, orthopnea or PND.  No co-ox this am. CVP remains 5.    - TEE: EF 15% (in setting of rapid afib), moderate LV dilation, moderately dilated/dysfunctional RV, moderate MR.  - Cardiac MRI: LV EF 34%, RV EF 30%, no LGE.    Objective:   Weight Range: 105.1 kg Body mass index is 38.56 kg/m.   Vital Signs:   Temp:  [98.3 F (36.8 C)-98.7 F (37.1 C)] 98.5 F (36.9 C) (11/08 0910) Pulse Rate:  [63-123] 122 (11/08 0910) Resp:  [15-34] 23 (11/08 0910) BP: (101-120)/(57-95) 105/78 (11/08 0910) SpO2:  [62 %-97 %] 96 % (11/08 0910) Last BM Date: 06/22/19  Weight change: Filed Weights   06/20/19 0500 06/21/19 0500 06/22/19 0500  Weight: 107.2 kg 107.4 kg 105.1 kg    Intake/Output:   Intake/Output Summary (Last 24 hours) at 06/23/2019 1021 Last data filed at 06/23/2019 0913 Gross per 24 hour  Intake 1101.31 ml  Output --  Net 1101.31 ml      Physical Exam   General:  Sitting in bed. No resp difficulty HEENT: normal Neck: supple. no JVD. Carotids 2+ bilat; no bruits. No lymphadenopathy or thryomegaly appreciated. Cor:  PMI nondisplaced. Irregular tachy Lungs: clear Abdomen: soft, nontender, nondistended. No hepatosplenomegaly. No bruits or masses. Good bowel sounds. Extremities: no cyanosis, clubbing, rash, edema Neuro: alert & orientedx3, cranial nerves grossly intact. moves all 4 extremities w/o difficulty. Affect pleasant    Telemetry   Atrial fibrillation 120-160s Personally reviewed  Labs    CBC Recent Labs    06/21/19 0405 06/22/19 0353  WBC 7.6 7.7  HGB 11.6* 12.0  HCT 38.9 40.2  MCV 90.9 89.3  PLT 364 [id number]   Basic Metabolic Panel Recent Labs    06/21/19 0405 06/22/19 0353 06/23/19 0605  NA 134* 135 136  K 3.7 3.0* 3.7  CL 96* 102 95*  CO2 26 23 28   GLUCOSE 204* 158* 132*  BUN <5* <5* 6*  CREATININE 0.77 0.71 0.95  CALCIUM 8.6* 6.9* 8.9  MG 1.9 3.2*  --    Liver Function Tests No results for input(s): AST, ALT, ALKPHOS, BILITOT, PROT, ALBUMIN in the last 72 hours. No results for input(s): LIPASE, AMYLASE in the last 72 hours. Cardiac Enzymes No results for input(s): CKTOTAL, CKMB, CKMBINDEX, TROPONINI in the last 72 hours.  BNP: BNP (last 3 results) Recent Labs    05/26/19 0720 05/27/19 0245 05/28/19 0630  BNP 604.0* 330.7* 345.9*    ProBNP (last 3 results) No results for input(s): PROBNP in the last 8760 hours.   D-Dimer  No results for input(s): DDIMER in the last 72 hours. Hemoglobin A1C No results for input(s): HGBA1C in the last 72 hours. Fasting Lipid Panel No results for input(s): CHOL, HDL, LDLCALC, TRIG, CHOLHDL, LDLDIRECT in the last 72 hours. Thyroid Function Tests No results for input(s): TSH, T4TOTAL, T3FREE, THYROIDAB in the last 72 hours.  Invalid input(s): FREET3  Other results:   Imaging    No results found.   Medications:     Scheduled Medications:  allopurinol  100 mg Oral QHS   atorvastatin  40 mg Oral QHS   Chlorhexidine Gluconate Cloth  6 each Topical Daily   digoxin  0.125 mg Oral Daily   DULoxetine  60 mg Oral  Daily   Gerhardt's butt cream   Topical QID   insulin aspart  0-9 Units Subcutaneous Q4H   levothyroxine  125 mcg Oral QAC breakfast   losartan  12.5 mg Oral BID   magic mouthwash  15 mL Oral QID   nystatin   Topical BID   pantoprazole  40 mg Oral BID   raloxifene  60 mg Oral QHS   rivaroxaban  20 mg Oral Daily   sodium chloride flush  3 mL Intravenous Q12H   spironolactone  25 mg Oral Daily   torsemide  40 mg Oral Daily    Infusions:  sodium chloride     amiodarone 60 mg/hr (06/23/19 0800)    PRN Medications: acetaminophen, cyclobenzaprine, HYDROmorphone (DILAUDID) injection, morphine injection, ondansetron (ZOFRAN) IV, ondansetron **OR** [DISCONTINUED] ondansetron (ZOFRAN) IV, sodium chloride flush, sodium chloride flush, sodium chloride flush, temazepam    Patient Profile   63 y/o female w/ h/o CAD s/p PCI in 2010, chronic combined systolic and diastolic HF (EF [id number] in 2018), atrial fibrillation on chronic a/c w/ Xarelto, HTN, prediabetes, hypothyroidism and diagnosed w/ COVID 19 in Sep 2020 and was treated at [facility] infection c/b PNA and gastroenteritis. Admitted to [facility] 10/27 for diverticulitis and afib w/ RVR and found to have worsening systolic function w/ EF now at 25-30%. Subsequent LHC showed widely patent coronaries. RHC c/w severely elevated filling pressures and low CO.   Assessment/Plan   1. Acute systolic CHF:  Nonischemic cardiomyopathy.  Echo in 2018 with EF 45-50%.  Echo this admission with EF 25-30%, diffuse hypokinesis, mildly decreased RV systolic function, moderate MR. LHC/RHC this admission with no significant coronary disease, markedly elevated filling pressures and low cardiac output (CI 1.6).  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 Cardiac MRI 11/5 showed LVEF 34%, RVEF 30%, no LGE => perhaps pointing to tachy-mediated CMP as more  likely etiology.  SCr 0.77 (down from 2.3 3 wks ago). Initially required inotropic support w/ milrinone but discontinued 11/4. Co-ox 64% yesterday. None this morning. Will draw. CVP 5 - Continue torsemide 40 daily - Continue losartan 12.5 mg bid.    - Continue spironolactone to 25 daily.  - Continue digoxin 0.125 daily, level ok  2. Atrial fibrillation with RVR: Persistent atrial fibrillation, it appears, at least since 9/20 when she was admitted with COVID-19. Prior, she had paroxysmal atrial fibrillation and was maintained on sotalol. She is now off sotalol.  Afib has been difficult to rate control, initially had DCCV 11/4 back to NSR.  Went back to afib/RVR in 140s on 11/5. Recardioverted on 11/6. Now back in AF with RVR again. Unable to get rate down despite amiodarone gtt and boluses     -  On amiodarone 60 mg/hr, continue.  - Continue Xarelto 20 mg daily   - I will bolus again with IV amio and add Ranexa.  - I have discussed extensively with Dr. Rayann Heman (who follows her as an outpatient) and Dr. Aundra Dubin. Pending AV node ablation and CRT-P tomorrow - Keep K >4.0 and Mg >2.0 - given body habitus, needs outpatient sleep study if no previous evaluation  3. Mitral regurgitation: Functional MR, moderate on TEE.  In NSR on cardiac MRI, MR was less impressive. Can re-assess after AF controlled.  4. Diverticulitis: Afebrile, WBCs not elevated. Cefdinir and Flagyl course has been completed.  5. Hypothyroidism: on levothyroxine.  6. Hypokalemia: K 3.7 Wil supplement. Personally reviewed 7. Hyperglycemia/Prediabetes: CBGs improving. Hgb A1c 04/2019 was 6.1  -management per IM    Glori Bickers MD 06/23/2019 10:21 AM

## 2019-06-23 NOTE — Plan of Care (Signed)
  Problem: Education: Goal: Knowledge of General Education information will improve Description Including pain rating scale, medication(s)/side effects and non-pharmacologic comfort measures Outcome: Progressing   Problem: Health Behavior/Discharge Planning: Goal: Ability to manage health-related needs will improve Outcome: Progressing   Problem: Clinical Measurements: Goal: Ability to maintain clinical measurements within normal limits will improve Outcome: Progressing Goal: Will remain free from infection Outcome: Progressing Goal: Diagnostic test results will improve Outcome: Progressing Goal: Respiratory complications will improve Outcome: Progressing Goal: Cardiovascular complication will be avoided Outcome: Progressing   Problem: Activity: Goal: Risk for activity intolerance will decrease Outcome: Progressing   Problem: Nutrition: Goal: Adequate nutrition will be maintained Outcome: Progressing   Problem: Coping: Goal: Level of anxiety will decrease Outcome: Progressing   Problem: Elimination: Goal: Will not experience complications related to bowel motility Outcome: Progressing Goal: Will not experience complications related to urinary retention Outcome: Progressing   Problem: Pain Managment: Goal: General experience of comfort will improve Outcome: Progressing   Problem: Skin Integrity: Goal: Risk for impaired skin integrity will decrease Outcome: Progressing   Problem: Education: Goal: Ability to demonstrate management of disease process will improve Outcome: Progressing Goal: Ability to verbalize understanding of medication therapies will improve Outcome: Progressing Goal: Individualized Educational Video(s) Outcome: Progressing   Problem: Activity: Goal: Capacity to carry out activities will improve Outcome: Progressing   Problem: Cardiac: Goal: Ability to achieve and maintain adequate cardiopulmonary perfusion will improve Outcome:  Progressing   

## 2019-06-23 NOTE — Progress Notes (Addendum)
PROGRESS NOTE  Melinda Mckinney [id number] DOB: Jan 02, 1956 DOA: 06/11/2019 PCP: Colon Branch, MD  HPI/Recap of past 24 hours: Melinda Mckinney a 63 y.o.femalewith medical history significant ofDM2, A.Fib on xarelto, CAD s/p stent.  Patient originally diagnosed with COVID19 on 9/15. Admitted with gastroenteritis due to COVID 10/9-10/13.  Developed worsening abd pain later in the month, CT abd/pelvis on 10/22 showed new acute diverticulitis not present on CT 10/9. She was put on cipro/flagyl.  She continued to have left lower quadrant abdominal pain so came back to the emergency room. In the emergency room, repeat CT scan of the abdomen pelvis showed improvement of diverticulitis.  Blood pressure was soft.  Lactate was 2.7.  Started on antibiotics.  Found to be on A. fib with RVR and treated with Cardizem drip. Echocardiogram showed severely reduced ejection fraction to 25%.  Underwent cardiac catheterization with normal coronaries.  Patient was started on milrinone, amiodarone and Lasix and admitted to progressive care unit.  06/19/2019: Remains persistent A. fib with RVR, underwent TEE and cardioversion with successful conversion to sinus rhythm and remained on amiodarone drip.  EF is 15%. 06/21/2019: Patient went back to rapid A. fib still on amnio drip.  Underwent another TEE cardioversion with success , again went into rapid Afib overnight.   Subjective: 06/23/19: Patient was seen and examined at her bedside this morning.  In A. fib with RVR.  Asymptomatic, denies chest pain, dyspnea, dizziness or palpitations.  Cardioverted multiple times.  On amiodarone drip and digoxin.  Cardiology following.   Assessment/Plan: Principal Problem:   Sepsis ([place]) Active Problems:   Essential hypertension   Coronary artery disease involving native coronary artery of native heart with angina pectoris (HCC)   Atrial fibrillation with RVR (HCC)   Acute on chronic systolic (congestive) heart failure ([place])  COVID-19   Diverticulitis  Paroxysmal atrial fibrillation with RVR Persistent and recurrent rapid A. fib, cardioversion on 06/19/2019 and repeat on 06/21/2019 but unsuccessful to maintain sinus rhythm. She may need ablation.  On amiodarone drip and digoxin On Xarelto for anticoagulation. Management per cardiology Currently asymptomatic with heart rate 120-130's  Acute on chronic systolic CHF, nonischemic cardiomyopathy. Patient with reduced EF of less than 20%.  Previous EF of 45-50% from 2018. Unknown etiology. Recent Covid infection and currently in atrial fibrillation.  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  MRI with normal myocardium. Was on IV Lasix with good response and diuresing and now converted to torsemide and aldactone. Added losartan.    Blood pressures are soft. Net I&O -932cc  continue strict I's and O's and daily weight  continue to monitor electrolytes, renal function, and blood pressure  Acute sigmoid diverticulitis, resolved. Failed outpatient treatment with ciprofloxacin/flagyl.  Symptoms improved.   Already received prolonged treatment.   Finished therapy.  Symptoms improved.     Essential hypertension/soft blood pressures Blood pressure soft and stable. Continue to monitor vital signs  Pre-diabetes No evidence of diabetes on chart review. Highest hemoglobin A1C is 6.1%. Recommend diet modification.  Hypothyroidism Recent TSH of 1.6 -Continue Synthroid  Recent COVID-19 pneumonia Symptoms improved.  O2 saturation 96% on room air On 2 L of oxygen by nasal cannula as needed at baseline  DVT prophylaxis:Xarelto Code Status:Full Code Family Communication: None Disposition Plan: Pending clinical improvement.  Remains in progressive care unit.  Anticipate discharge home with home health care after clinical improvement and cardiology signs off.  Consultants:  Cardiology  Procedures:  11/1: Transthoracic  Echocardiogram IMPRESSIONS  1. Left ventricular ejection fraction, by visual estimation, is 25 to 30%. The left ventricle has severely decreased function. There is no left ventricular hypertrophy. 2. Left ventricular diastolic parameters are indeterminate. 3. The left ventricle demonstrates global hypokinesis. 4. Global right ventricle has mildly reduced systolic function.The right ventricular size is normal. No increase in right ventricular wall thickness. 5. Left atrial size was moderately dilated. 6. Right atrial size was mildly dilated. 7. The mitral valve is normal in structure. Moderate mitral valve regurgitation. No evidence of mitral stenosis. 8. The tricuspid valve is normal in structure. Tricuspid valve regurgitation moderate. 9. The aortic valve is normal in structure. Aortic valve regurgitation is mild. No evidence of aortic valve sclerosis or stenosis. 10. The pulmonic valve was normal in structure. Pulmonic valve regurgitation is mild. 11. Aortic dilatation noted. 12. There is mild dilatation of the ascending aorta measuring 39 mm. 13. Mildly elevated pulmonary artery systolic pressure. 14. The inferior vena cava is normal in size with greater than 50% respiratory variability, suggesting right atrial pressure of 3 mmHg.  FINDINGS Left Ventricle: Left ventricular ejection fraction, by visual estimation, is 25 to 30%. The left ventricle has severely decreased function. The left ventricle demonstrates global hypokinesis. There is no left ventricular hypertrophy. Left ventricular  diastolic parameters are indeterminate. Normal left atrial pressure.  Right Ventricle: The right ventricular size is normal. No increase in right ventricular wall thickness. Global RV systolic function is has mildly reduced systolic function. The tricuspid regurgitant velocity is 2.46 m/s, and with an assumed right atrial  pressure of 8 mmHg, the estimated right ventricular systolic pressure  is mildly elevated at 32.2 mmHg.  Left Atrium: Left atrial size was moderately dilated.  Right Atrium: Right atrial size was mildly dilated  Pericardium: There is no evidence of pericardial effusion.  Mitral Valve: The mitral valve is normal in structure. No evidence of mitral valve stenosis by observation. Moderate mitral valve regurgitation.  Tricuspid Valve: The tricuspid valve is normal in structure. Tricuspid valve regurgitation moderate.  Aortic Valve: The aortic valve is normal in structure. Aortic valve regurgitation is mild. Aortic regurgitation PHT measures 835 msec. The aortic valve is structurally normal, with no evidence of sclerosis or stenosis.  Pulmonic Valve: The pulmonic valve was normal in structure. Pulmonic valve regurgitation is mild.  Aorta: Aortic dilatation noted. There is mild dilatation of the ascending aorta measuring 39 mm.  Venous: The inferior vena cava is normal in size with greater than 50% respiratory variability, suggesting right atrial pressure of 3 mmHg.  IAS/Shunts: No atrial level shunt detected by color flow Doppler. No ventricular septal defect is seen or detected. There is no evidence of an atrial septal defect.  Antimicrobials:  Ceftriaxone (10/27>> 06/20/2019  Flagyl (10/27>> 06/20/2019    Objective: Vitals:   06/23/19 0400 06/23/19 0500 06/23/19 0800 06/23/19 0910  BP: 116/78   105/78  Pulse:  69 (!) 121 (!) 122  Resp:  (!) 23 (!) 24 (!) 23  Temp:  98.7 F (37.1 C)  98.5 F (36.9 C)  TempSrc:  Oral  Oral  SpO2:  96% 96% 96%  Weight:      Height:        Intake/Output Summary (Last 24 hours) at 06/23/2019 0933 Last data filed at 06/23/2019 0913 Gross per 24 hour  Intake 1480.15 ml  Output 100 ml  Net 1380.15 ml   Filed Weights   06/20/19 0500 06/21/19 0500 06/22/19 0500  Weight: 107.2 kg 107.4 kg 105.1 kg  Exam:  . General: 63 y.o. year-old female pleasant well developed well nourished in no acute  distress.  Alert and oriented x4. . Cardiovascular: Tachycardic with no rubs or gallops.  No thyromegaly or JVD noted.   Marland Kitchen Respiratory: Clear to auscultation with no wheezes or rales. Good inspiratory effort. . Abdomen: Soft nontender nondistended with normal bowel sounds x4 quadrants. . Musculoskeletal: Trace lower extremity edema. 2/4 pulses in all 4 extremities. Marland Kitchen Psychiatry: Mood is appropriate for condition and setting   Data Reviewed: CBC: Recent Labs  Lab 06/18/19 0223  06/19/19 0241 06/19/19 0348 06/20/19 0445 06/21/19 0405 06/22/19 0353  WBC 7.2  --  7.6  --  6.4 7.6 7.7  HGB 10.5*   < > 11.2* 12.2 10.8* 11.6* 12.0  HCT 34.8*   < > 36.7 36.0 35.6* 38.9 40.2  MCV 91.1  --  89.1  --  90.1 90.9 89.3  PLT 284  --  322  --  285 364 354   < > = values in this interval not displayed.   Basic Metabolic Panel: Recent Labs  Lab 06/18/19 2237  06/19/19 0241 06/19/19 0348 06/20/19 0445 06/21/19 0405 06/22/19 0353 06/23/19 0605  NA 134*   < > 134* 136 136 134* 135 136  K 3.4*   < > 4.0 4.0 3.9 3.7 3.0* 3.7  CL 94*  --  92*  --  97* 96* 102 95*  CO2 29  --  28  --  27 26 23 28   GLUCOSE 270*  --  273*  --  177* 204* 158* 132*  BUN 5*  --  6*  --  7* <5* <5* 6*  CREATININE 0.81  --  0.78  --  0.78 0.77 0.71 0.95  CALCIUM 8.2*  --  8.3*  --  8.5* 8.6* 6.9* 8.9  MG 2.2  --  2.0  --  1.9 1.9 3.2*  --    < > = values in this interval not displayed.   GFR: Estimated Creatinine Clearance: 71 mL/min (by C-G formula based on SCr of 0.95 mg/dL). Liver Function Tests: No results for input(s): AST, ALT, ALKPHOS, BILITOT, PROT, ALBUMIN in the last 168 hours. No results for input(s): LIPASE, AMYLASE in the last 168 hours. No results for input(s): AMMONIA in the last 168 hours. Coagulation Profile: No results for input(s): INR, PROTIME in the last 168 hours. Cardiac Enzymes: No results for input(s): CKTOTAL, CKMB, CKMBINDEX, TROPONINI in the last 168 hours. BNP (last 3 results) No  results for input(s): PROBNP in the last 8760 hours. HbA1C: No results for input(s): HGBA1C in the last 72 hours. CBG: Recent Labs  Lab 06/22/19 0746 06/22/19 1135 06/22/19 1622 06/22/19 2243 06/23/19 0600  GLUCAP 139* 126* 136* 147* 145*   Lipid Profile: No results for input(s): CHOL, HDL, LDLCALC, TRIG, CHOLHDL, LDLDIRECT in the last 72 hours. Thyroid Function Tests: No results for input(s): TSH, T4TOTAL, FREET4, T3FREE, THYROIDAB in the last 72 hours. Anemia Panel: No results for input(s): VITAMINB12, FOLATE, FERRITIN, TIBC, IRON, RETICCTPCT in the last 72 hours. Urine analysis:    Component Value Date/Time   COLORURINE AMBER (A) 06/11/2019 1526   APPEARANCEUR CLEAR 06/11/2019 1526   LABSPEC >1.046 (H) 06/11/2019 1526   PHURINE 6.0 06/11/2019 1526   GLUCOSEU NEGATIVE 06/11/2019 1526   GLUCOSEU NEGATIVE 06/06/2019 1538   HGBUR SMALL (A) 06/11/2019 1526   BILIRUBINUR NEGATIVE 06/11/2019 1526   KETONESUR NEGATIVE 06/11/2019 1526   PROTEINUR 30 (A) 06/11/2019 1526  UROBILINOGEN 0.2 06/06/2019 1538   NITRITE NEGATIVE 06/11/2019 1526   LEUKOCYTESUR TRACE (A) 06/11/2019 1526   Sepsis Labs: @LABRCNTIP (procalcitonin:4,lacticidven:4)  )No results found for this or any previous visit (from the past 240 hour(s)).    Studies: No results found.  Scheduled Meds: . allopurinol  100 mg Oral QHS  . atorvastatin  40 mg Oral QHS  . Chlorhexidine Gluconate Cloth  6 each Topical Daily  . digoxin  0.125 mg Oral Daily  . DULoxetine  60 mg Oral Daily  . Gerhardt's butt cream   Topical QID  . insulin aspart  0-9 Units Subcutaneous Q4H  . levothyroxine  125 mcg Oral QAC breakfast  . losartan  12.5 mg Oral BID  . magic mouthwash  15 mL Oral QID  . nystatin   Topical BID  . pantoprazole  40 mg Oral BID  . raloxifene  60 mg Oral QHS  . rivaroxaban  20 mg Oral Daily  . sodium chloride flush  10-40 mL Intracatheter Q12H  . sodium chloride flush  10-40 mL Intracatheter Q12H  . sodium  chloride flush  3 mL Intravenous Q12H  . spironolactone  25 mg Oral Daily  . torsemide  40 mg Oral Daily    Continuous Infusions: . sodium chloride 0 mL (06/19/19 1306)  . sodium chloride    . amiodarone 60 mg/hr (06/23/19 0800)     LOS: 12 days     Kayleen Memos, MD [facility] Pager [phone]  If 7PM-7AM, please contact night-coverage [email or web address] Password [facility] 06/23/2019, 9:33 AM

## 2019-06-23 NOTE — Progress Notes (Signed)
Patient didn't test for COVID test when she admitted to hospital. Latest one was October 9th which was positive. Notified Dr. Nevada Crane regarding this matter and she asked to call infection disease doctor or infections prevention team. Called Dr. Tommy Medal regarding this matter. He asked to call infection prevention team. Called infection prevention team regarding this matter and patient already treated COVID and within 3 month, doesn't need to test again. HS [employer]

## 2019-06-23 NOTE — Progress Notes (Addendum)
Patient does not have foley catheter,  arrived to [facility] without foley in place. Patient is using BSC.

## 2019-06-24 ENCOUNTER — Encounter (HOSPITAL_COMMUNITY): Payer: Self-pay | Admitting: Internal Medicine

## 2019-06-24 ENCOUNTER — Inpatient Hospital Stay (HOSPITAL_COMMUNITY)
Admission: EM | Disposition: A | Discharge status: Home Health | Payer: Self-pay | Source: Home / Self Care | Attending: Family Medicine

## 2019-06-24 DIAGNOSIS — I5023 Acute on chronic systolic (congestive) heart failure: Secondary | ICD-10-CM

## 2019-06-24 HISTORY — PX: AV NODE ABLATION: EP1193

## 2019-06-24 HISTORY — PX: BIV PACEMAKER INSERTION CRT-P: EP1199

## 2019-06-24 LAB — GLUCOSE, CAPILLARY
Glucose-Capillary: 158 mg/dL — ABNORMAL HIGH (ref 70–99)
Glucose-Capillary: 145 mg/dL — ABNORMAL HIGH (ref 70–99)
Glucose-Capillary: 120 mg/dL — ABNORMAL HIGH (ref 70–99)
Glucose-Capillary: 136 mg/dL — ABNORMAL HIGH (ref 70–99)
Glucose-Capillary: 117 mg/dL — ABNORMAL HIGH (ref 70–99)
Glucose-Capillary: 137 mg/dL — ABNORMAL HIGH (ref 70–99)

## 2019-06-24 LAB — COOXEMETRY PANEL
Carboxyhemoglobin: 1.3 % (ref 0.5–1.5)
Methemoglobin: 0.8 % (ref 0.0–1.5)
O2 Saturation: 62 %
Total hemoglobin: 13.7 g/dL (ref 12.0–16.0)

## 2019-06-24 LAB — BASIC METABOLIC PANEL
Anion gap: 14 (ref 5–15)
BUN: 7 mg/dL — ABNORMAL LOW (ref 8–23)
CO2: 27 mmol/L (ref 22–32)
Calcium: 9.1 mg/dL (ref 8.9–10.3)
Chloride: 93 mmol/L — ABNORMAL LOW (ref 98–111)
Creatinine, Ser: 1.22 mg/dL — ABNORMAL HIGH (ref 0.44–1.00)
GFR calc Af Amer: 54 mL/min — ABNORMAL LOW (ref >60)
GFR calc non Af Amer: 46 mL/min — ABNORMAL LOW (ref >60)
Glucose, Bld: 137 mg/dL — ABNORMAL HIGH (ref 70–99)
Potassium: 3.6 mmol/L (ref 3.5–5.1)
Sodium: 134 mmol/L — ABNORMAL LOW (ref 135–145)

## 2019-06-24 SURGERY — BIV PACEMAKER INSERTION CRT-P
Anesthesia: LOCAL

## 2019-06-24 MED ORDER — MIDAZOLAM HCL 5 MG/5ML IJ SOLN
INTRAMUSCULAR | Status: AC
  Filled 2019-06-24: qty 5

## 2019-06-24 MED ORDER — MIDAZOLAM HCL 5 MG/5ML IJ SOLN
INTRAMUSCULAR | Status: DC | PRN
  Administered 2019-06-24 (×2): 1 mg via INTRAVENOUS
  Administered 2019-06-24: 2 mg via INTRAVENOUS
  Administered 2019-06-24 (×3): 1 mg via INTRAVENOUS
  Administered 2019-06-24: 2 mg via INTRAVENOUS
  Administered 2019-06-24: 1 mg via INTRAVENOUS

## 2019-06-24 MED ORDER — HEPARIN (PORCINE) IN NACL 2-0.9 UNITS/ML
INTRAMUSCULAR | Status: AC | PRN
  Administered 2019-06-24 (×2): 500 mL

## 2019-06-24 MED ORDER — ONDANSETRON HCL 4 MG/2ML IJ SOLN: 4.0000 mg | Freq: Four times a day (QID) | INTRAMUSCULAR | Status: DC | PRN

## 2019-06-24 MED ORDER — FENTANYL CITRATE (PF) 100 MCG/2ML IJ SOLN
INTRAMUSCULAR | Status: DC | PRN
  Administered 2019-06-24 (×6): 12.5 ug via INTRAVENOUS
  Administered 2019-06-24 (×2): 25 ug via INTRAVENOUS

## 2019-06-24 MED ORDER — SODIUM CHLORIDE 0.9 % IV SOLN: INTRAVENOUS | Status: DC

## 2019-06-24 MED ORDER — LIDOCAINE HCL (PF) 1 % IJ SOLN
INTRAMUSCULAR | Status: DC | PRN
  Administered 2019-06-24: 60 mL

## 2019-06-24 MED ORDER — HEPARIN (PORCINE) IN NACL 1000-0.9 UT/500ML-% IV SOLN
INTRAVENOUS | Status: AC
  Filled 2019-06-24: qty 500

## 2019-06-24 MED ORDER — SODIUM CHLORIDE 0.9 % IV SOLN: 250.0000 mL | INTRAVENOUS | Status: DC | PRN

## 2019-06-24 MED ORDER — SODIUM CHLORIDE 0.9% FLUSH: 3.0000 mL | INTRAVENOUS | Status: DC | PRN

## 2019-06-24 MED ORDER — BUPIVACAINE HCL (PF) 0.25 % IJ SOLN
INTRAMUSCULAR | Status: DC | PRN
  Administered 2019-06-24: 30 mL

## 2019-06-24 MED ORDER — VANCOMYCIN HCL IN DEXTROSE 1-5 GM/200ML-% IV SOLN
INTRAVENOUS | Status: AC
  Filled 2019-06-24: qty 200

## 2019-06-24 MED ORDER — POTASSIUM CHLORIDE CRYS ER 20 MEQ PO TBCR
40.0000 meq | EXTENDED_RELEASE_TABLET | Freq: Once | ORAL | Status: AC
  Administered 2019-06-24: 40 meq via ORAL
  Filled 2019-06-24: qty 2

## 2019-06-24 MED ORDER — SODIUM CHLORIDE 0.45 % IV SOLN: INTRAVENOUS | Status: DC

## 2019-06-24 MED ORDER — BUPIVACAINE HCL (PF) 0.25 % IJ SOLN
INTRAMUSCULAR | Status: AC
  Filled 2019-06-24: qty 30

## 2019-06-24 MED ORDER — FENTANYL CITRATE (PF) 100 MCG/2ML IJ SOLN
INTRAMUSCULAR | Status: AC
  Filled 2019-06-24: qty 2

## 2019-06-24 MED ORDER — SODIUM CHLORIDE 0.9 % IV SOLN
INTRAVENOUS | Status: AC
  Filled 2019-06-24: qty 2

## 2019-06-24 MED ORDER — LIDOCAINE HCL 1 % IJ SOLN
INTRAMUSCULAR | Status: AC
  Filled 2019-06-24: qty 60

## 2019-06-24 MED ORDER — VANCOMYCIN HCL 10 G IV SOLR
1500.0000 mg | INTRAVENOUS | Status: AC
  Administered 2019-06-24: 1500 mg via INTRAVENOUS
  Filled 2019-06-24: qty 1500

## 2019-06-24 MED ORDER — CHLORHEXIDINE GLUCONATE 4 % EX LIQD
60.0000 mL | Freq: Once | CUTANEOUS | Status: AC
  Administered 2019-06-24: 4 via TOPICAL
  Filled 2019-06-24: qty 15

## 2019-06-24 MED ORDER — ACETAMINOPHEN 325 MG PO TABS
650.0000 mg | ORAL_TABLET | ORAL | Status: DC | PRN
  Administered 2019-06-24: 650 mg via ORAL

## 2019-06-24 MED ORDER — IOHEXOL 350 MG/ML SOLN
INTRAVENOUS | Status: DC | PRN
  Administered 2019-06-24: 15 mL
  Administered 2019-06-24: 10 mL

## 2019-06-24 MED ORDER — VANCOMYCIN HCL IN DEXTROSE 1-5 GM/200ML-% IV SOLN
1000.0000 mg | Freq: Two times a day (BID) | INTRAVENOUS | Status: AC
  Administered 2019-06-24: 1000 mg via INTRAVENOUS
  Filled 2019-06-24: qty 200

## 2019-06-24 MED ORDER — SODIUM CHLORIDE 0.9% FLUSH
3.0000 mL | Freq: Two times a day (BID) | INTRAVENOUS | Status: DC
  Administered 2019-06-24 – 2019-06-26 (×4): 3 mL via INTRAVENOUS

## 2019-06-24 MED ORDER — HEPARIN (PORCINE) IN NACL 1000-0.9 UT/500ML-% IV SOLN
INTRAVENOUS | Status: AC
  Filled 2019-06-24: qty 1000

## 2019-06-24 MED ORDER — ACETAMINOPHEN 325 MG PO TABS
325.0000 mg | ORAL_TABLET | ORAL | Status: DC | PRN
  Administered 2019-06-25 (×2): 650 mg via ORAL
  Filled 2019-06-24 (×2): qty 2

## 2019-06-24 MED ORDER — CHLORHEXIDINE GLUCONATE 4 % EX LIQD
60.0000 mL | Freq: Once | CUTANEOUS | Status: AC
  Administered 2019-06-24: 4 via TOPICAL

## 2019-06-24 MED ORDER — SODIUM CHLORIDE 0.9 % IV SOLN
80.0000 mg | INTRAVENOUS | Status: AC
  Administered 2019-06-24: 80 mg

## 2019-06-24 SURGICAL SUPPLY — 19 items
ADAPTER SEALING SSA-EW-09 (MISCELLANEOUS) ×1 IMPLANT
ADPR INTRO LNG 9FR SL XTD WNG (MISCELLANEOUS) ×1
CABLE SURGICAL S-101-97-12 (CABLE) ×2 IMPLANT
CATH ATTAIN COM SURV 6250V-MB2 (CATHETERS) ×1 IMPLANT
CATH CELSIUS THERMO F CV 7FR (ABLATOR) ×1 IMPLANT
CATH HEX JOSEPH 2-5-2 65CM 6F (CATHETERS) ×1 IMPLANT
DEVICE CRTP PERCEPTA QUAD MRI (Pacemaker) ×1 IMPLANT
LEAD ATTAIN PERFORMA S 4598-88 (Lead) ×1 IMPLANT
LEAD CAPSURE NOVUS 5076-52CM (Lead) ×1 IMPLANT
LEAD CAPSURE NOVUS 5076-58CM (Lead) ×1 IMPLANT
PACK EP LATEX FREE (CUSTOM PROCEDURE TRAY) ×2
PACK EP LF (CUSTOM PROCEDURE TRAY) ×1 IMPLANT
PAD PRO RADIOLUCENT 2001M-C (PAD) ×2 IMPLANT
SHEATH 7FR PRELUDE SNAP 13 (SHEATH) ×2 IMPLANT
SHEATH 9FR PRELUDE SNAP 13 (SHEATH) ×1 IMPLANT
SHEATH PINNACLE 8F 10CM (SHEATH) ×1 IMPLANT
SLITTER 6232ADJ (MISCELLANEOUS) ×1 IMPLANT
TRAY PACEMAKER INSERTION (PACKS) ×2 IMPLANT
WIRE ACUITY WHISPER EDS 4648 (WIRE) ×3 IMPLANT

## 2019-06-24 NOTE — Progress Notes (Signed)
Site area: rt groin fv sheath Site Prior to Removal:  Level 0 Pressure Applied For: 10 minutes Manual:   yes Patient Status During Pull:  stable Post Pull Site:  Level 0 Post Pull Instructions Given:  yes Post Pull Pulses Present:  Dressing Applied:  Gauze and tegaderm Bedrest begins @ 1200 Comments:

## 2019-06-24 NOTE — H&P (Signed)
Primary Care Physician: Colon Branch, MD  Referring Physician: Dr Haroldine Laws  Admit Date: 06/11/2019  Reason for consultation: Refractory afib  Melinda Mckinney is a 63 y.o. female who is well known to me. She has a h/o paroxysmal atrial fibrillation and had done very well with sotalol. She has had a very difficult course over the past few months with COVID in September followed by pneumonia and subsequently acute diverticulitis. She has since developed recurrent and more persistent afib. V rates have been very elevated. She has developed worsening EF (25%) with acute decompensated CHF. She has been evaluated by Dr Aundra Dubin. She was started on amiodarone and subsequently cardioverted without success. She remains on IV amiodarone.  She had cath this admission which revealed low CO with elevated RH pressures. Cors were widely patent. Her decompensation is felt to largely be due to AF with RVR. She has required milrinone therapy.  Today, she denies symptoms of palpitations, chest pain, shortness of breath, orthopnea, PND, lower extremity edema, dizziness, presyncope, syncope, or neurologic sequela. The patient is tolerating medications without difficulties and is otherwise without complaint today.      Past Medical History:  Diagnosis Date  . Anemia   . Anxiety and depression   . Asthma   . Atrial fibrillation ([place]) 07/2017  . Borderline diabetes   . CAD (coronary artery disease) ~2009   stent   . Cervical cancer ([place])    "stage 1; had cryotherapy"  . Chronic lower back pain   . DDD (degenerative disc disease), lumbar    Dr. Nelva Bush, recommends lumbar epidural steroid injections L5-S1 to the right  . Depression   . Fibromyalgia   . GERD (gastroesophageal reflux disease)   . High cholesterol   . History of hiatal hernia   . History of kidney stones   . Hypertension   . Hypothyroidism   . Osteoporosis   . Thyroid disease         Past Surgical History:  Procedure Laterality Date  . CARDIAC  CATHETERIZATION  ~ 2008  . CARDIOVERSION N/A 08/05/2017   Procedure: CARDIOVERSION; Surgeon: Evans Lance, MD; Location: [place]; Service: Cardiovascular; Laterality: N/A;  . CARDIOVERSION N/A 08/24/2017   Procedure: CARDIOVERSION; Surgeon: Sanda Klein, MD; Location: [place] [facility]; Service: Cardiovascular; Laterality: N/A;  . CARDIOVERSION N/A 06/19/2019   Procedure: CARDIOVERSION; Surgeon: Larey Dresser, MD; Location: [place]; Service: Cardiovascular; Laterality: N/A;  . [place]; ~ 1978/1979; 1984  . ESOPHAGOMYOTOMY  2009  . GYNECOLOGIC CRYOSURGERY  X 2   "stage 1 cancer"  . HERNIA REPAIR  X 6   "all in my stomach" (07/27/2017)  . KNEE ARTHROSCOPY Right   . NEPHRECTOMY Right 1991   in [place], due to fibrosis and lithiasis  . RIGHT/LEFT HEART CATH AND CORONARY ANGIOGRAPHY N/A 06/17/2019   Procedure: RIGHT/LEFT HEART CATH AND CORONARY ANGIOGRAPHY; Surgeon: Lorretta Harp, MD; Location: [facility]; Service: Cardiovascular; Laterality: N/A;  . TEE WITHOUT CARDIOVERSION N/A 06/19/2019   Procedure: TRANSESOPHAGEAL ECHOCARDIOGRAM (TEE); Surgeon: Larey Dresser, MD; Location: [facility]; Service: Cardiovascular; Laterality: N/A;  . TUBAL LIGATION     . allopurinol 100 mg Oral QHS  . atorvastatin 40 mg Oral QHS  . Chlorhexidine Gluconate Cloth 6 each Topical Daily  . digoxin 0.125 mg Oral Daily  . DULoxetine 60 mg Oral Daily  . Gerhardt's butt cream  Topical QID  . insulin aspart 0-9 Units Subcutaneous Q4H  . levothyroxine 125 mcg Oral QAC  breakfast  . losartan 12.5 mg Oral BID  . magic mouthwash 15 mL Oral QID  . nystatin  Topical BID  . pantoprazole 40 mg Oral BID  . raloxifene 60 mg Oral QHS  . rivaroxaban 20 mg Oral Daily  . sodium chloride flush 10-40 mL Intracatheter Q12H  . sodium chloride flush 10-40 mL Intracatheter Q12H  . sodium chloride flush 3 mL Intravenous Q12H  . spironolactone 25 mg Oral Daily  . torsemide 40 mg Oral Daily   . sodium  chloride 0 mL (06/19/19 1306)  . sodium chloride   . amiodarone 60 mg/hr (06/23/19 0800)        Allergies  Allergen Reactions  . Penicillins Swelling and Rash    Has patient had a PCN reaction causing immediate rash, facial/tongue/throat swelling, SOB or lightheadedness with hypotension: No  Has patient had a PCN reaction causing severe rash involving mucus membranes or skin necrosis: No  Has patient had a PCN reaction that required hospitalization: No  Has patient had a PCN reaction occurring within the last 10 years: No  If all of the above answers are "NO", then may proceed with Cephalosporin use.   Social History        Socioeconomic History  . Marital status: Married    Spouse name: Not on file  . Number of children: 3  . Years of education: Not on file  . Highest education level: Not on file  Occupational History  . 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 Financial resource strain: Not on file  . Food insecurity    Worry: Not on file    Inability: Not on file  . Transportation needs    Medical: Not on file    Non-medical: Not on file  Tobacco Use  . Smoking status: Former Smoker    Packs/day: 2.00    Years: 28.00    Pack years: 56.00    Types: Cigarettes    Quit date: 2000    Years since quitting: 20.8  . Smokeless tobacco: Never Used  Substance and Sexual Activity  . Alcohol use: Yes    Comment: 07/27/2017 "3-4 drinks//month"  . Drug use: No  . Sexual activity: Not Currently    Comment: 1ST INTERCOURSE- 16, PARTNERS - 2  Lifestyle  . Physical activity    Days per week: Not on file    Minutes per session: Not on file  . Stress: Not on file  Relationships  . Social Herbalist on phone: Not on file    Gets together: Not on file    Attends religious service: Not on file    Active member of club or organization: Not on file    Attends meetings of clubs or organizations: Not on file    Relationship status: Not on file  . Intimate partner  violence    Fear of current or ex partner: Not on file    Emotionally abused: Not on file    Physically abused: Not on file    Forced sexual activity: Not on file  Other Topics Concern  . Not on file  Social History Narrative   Original from [place]   3 children, 2 alive   Household --pt and husband         Family History  Problem Relation Age of Onset  . Breast cancer Other    aunt   . CAD Brother   . Lung cancer Mother    F and  M  . Diabetes Father    F and mother   . CAD Father   . Lung cancer Father   . Stroke Sister   . Colon cancer Neg Hx    ROS- All systems are reviewed and negative except as per the HPI above  Physical Exam:  Telemetry: Afib, V rates frequent 120s-130s        Vitals:   06/23/19 0400 06/23/19 0500 06/23/19 0800 06/23/19 0910  BP: 116/78   105/78  Pulse:  69 (!) 121 (!) 122  Resp:  (!) 23 (!) 24 (!) 23  Temp:  98.7 F (37.1 C)  98.5 F (36.9 C)  TempSrc:  Oral  Oral  SpO2:  96% 96% 96%  Weight:      Height:       GEN- The patient is overweight appearing, alert and oriented x 3 today.  Head- normocephalic, atraumatic  Eyes- Sclera clear, conjunctiva pink  Ears- hearing intact  Oropharynx- clear  Neck- supple,  Lungs- normal work of breathing  Heart- tachycardic irregular rhythm  GI- soft  Extremities- no clubbing, cyanosis, or edema  Skin- no rash or lesion  Psych- euthymic mood, full affect  Neuro- strength and sensation are intact  EKGs since 04/30/2019 consistently show that she has been in afib  Labs:  Recent Labs                                               Last Labs                                              Radiology:  Cardiac MRI 06/20/2019- EF 35% with no LGE, diffuse HIK, moderate LA enlargement, mild to moderate MR  Echo: 06/16/2019- EF 25%, LA 64mm  ASSESSMENT AND PLAN:  1. afib with RVR/ acute decompensated systolic dysfunction  The patient has medicine refractory afib. She has been  persistently in afib since at least September. This was likely seconadry to her COVID, pneumonia, diverticulitis events initially. She has at least moderate LA enlargement and appears to have remodeled structurally to prefer afib. She has failed medical therapy with sotalol and amiodarone.  I worry that our ability to maintain sinus rhythm is low.  Anticipated success with PVI would be 60-65% and may require multiple procedures. In addition, the likelihood of ERAF over the next 3 months would be high. I also worry about the risks of this procedure given her acute decompensation. Unfortunately, V rates are very difficult to manage.  I have discussed at length with Dr Haroldine Laws today. I think that the best next step is CRT-P with AV nodal ablation. Given no LGE on MRI, I do think that her EF will recover with rate control and therefore do not think that ICD is required at this time.  I have had a thorough discussion with the patient reviewing options. I also discussed with her son by phone yesterday. The patient has had opportunities to ask questions and have them answered. The patient and I have decided together through a shared decision making process to proceed with CRT-P and AV nodal ablation.  Risks, benefits, alternatives to CRT-P with AV nodal ablation were discussed in detail with the patient today. The patient understands  that the risks include but are not limited to bleeding, infection, pneumothorax, perforation, tamponade, vascular damage, renal failure, MI, stroke, death, and lead dislodgement and wishes to proceed. We will therefore schedule device implantation at the next available time.  I have tentatively scheduled with Dr Lovena Le for tomorrow.  Continue current medicines, including xarelto in the interim.  Thompson Grayer, MD  06/23/2019  9:41 AM  EP Attending  Patient seen and examined. Agree with the findings as noted above. I have discussed the treatment options with the patient via the  [place] interpreter. The patient has a presumed tachy induced CM and uncontrolled atrial fib. She at rest has HR's in the 120-140 range despite IV amio and other AV nodal blocking agents. I have discussed the treatment options and we will proceed with AV node ablation and biv ppm insertion.  Mikle Bosworth.D.

## 2019-06-24 NOTE — Discharge Instructions (Signed)
Post ablation site (groin) care instructions No lifting over 5 lbs for 1 week. No vigorous walking or activities for 1 week. Keep procedure site clean & dry. If you notice increased pain, swelling, bleeding or pus, call/return!      Supplemental Discharge Instructions for  Pacemaker Patients  Activity No heavy lifting or vigorous activity with your left/right arm for 6 to 8 weeks.  Do not raise your left/right arm above your head for one week.  Gradually raise your affected arm as drawn below.             06/28/2019               06/29/2019              06/30/2019             07/01/2019 __  NO DRIVING for  1 week   ; you may begin driving on   [id number]  .  WOUND CARE - Keep the wound area clean and dry.  Do not get this area wet for one week. No showers for one week; you may shower on  07/01/2019   . - The tape/steri-strips on your wound will fall off; do not pull them off.  No bandage is needed on the site.  DO  NOT apply any creams, oils, or ointments to the wound area. - If you notice any drainage or discharge from the wound, any swelling or bruising at the site, or you develop a fever > 101? F after you are discharged home, call the office at once.  Special Instructions - You are still able to use cellular telephones; use the ear opposite the side where you have your pacemaker/defibrillator.  Avoid carrying your cellular phone near your device. - When traveling through airports, show security personnel your identification card to avoid being screened in the metal detectors.  Ask the security personnel to use the hand wand. - Avoid arc welding equipment, MRI testing (magnetic resonance imaging), TENS units (transcutaneous nerve stimulators).  Call the office for questions about other devices. - Avoid electrical appliances that are in poor condition or are not properly grounded. - Microwave ovens are safe to be near or to operate.

## 2019-06-24 NOTE — Progress Notes (Signed)
PT Cancellation Note  Patient Details Name: Melinda Mckinney MRN: [id number] DOB: 05-05-1956   Cancelled Treatment:    Reason Eval/Treat Not Completed: Patient not medically ready. Per chart review patient returned from cardiac cath procedure and now on bedrest until AM. PT will attempt to follow up per PT POC.   [name] 06/24/2019, 12:50 PM

## 2019-06-24 NOTE — Progress Notes (Addendum)
Progress Note  Patient Name: Melinda Mckinney Date of Encounter: 06/24/2019  Primary Cardiologist: Melinda Latch, MD  Electrophysiologist: Dr. Rayann Mckinney  Subjective   No CP, aware of her heart fast, no SOB this AM  Inpatient Medications    Scheduled Meds: . allopurinol  100 mg Oral QHS  . atorvastatin  40 mg Oral QHS  . Chlorhexidine Gluconate Cloth  6 each Topical Daily  . digoxin  0.125 mg Oral Daily  . DULoxetine  60 mg Oral Daily  . gentamicin irrigation  80 mg Irrigation To SSTC  . Gerhardt's butt cream   Topical QID  . insulin aspart  0-9 Units Subcutaneous Q4H  . levothyroxine  125 mcg Oral QAC breakfast  . losartan  12.5 mg Oral BID  . magic mouthwash  15 mL Oral QID  . nystatin   Topical BID  . pantoprazole  40 mg Oral BID  . raloxifene  60 mg Oral QHS  . ranolazine  500 mg Oral BID  . sodium chloride flush  3 mL Intravenous Q12H  . spironolactone  25 mg Oral Daily  . torsemide  40 mg Oral Daily   Continuous Infusions: . sodium chloride    . sodium chloride    . sodium chloride    . amiodarone 60 mg/hr (06/24/19 0251)  . vancomycin     PRN Meds: acetaminophen, cyclobenzaprine, HYDROmorphone (DILAUDID) injection, morphine injection, ondansetron (ZOFRAN) IV, ondansetron **OR** [DISCONTINUED] ondansetron (ZOFRAN) IV, sodium chloride flush, sodium chloride flush, sodium chloride flush, temazepam   Vital Signs    Vitals:   06/23/19 0910 06/23/19 1600 06/23/19 1929 06/24/19 0200  BP: 105/78 112/79 (!) 121/107   Pulse: (!) 122 (!) 122 62 93  Resp: (!) 23 (!) 21 (!) 22 20  Temp: 98.5 F (36.9 C) 98.7 F (37.1 C) 99.1 F (37.3 C)   TempSrc: Oral Oral Oral   SpO2: 96% 96% 94% 94%  Weight:      Height:        Intake/Output Summary (Last 24 hours) at 06/24/2019 0722 Last data filed at 06/24/2019 0259 Gross per 24 hour  Intake 1684.34 ml  Output -  Net 1684.34 ml   Last 3 Weights 06/22/2019 06/21/2019 06/20/2019  Weight (lbs) 231 lb 11.3 oz 236 lb 12.4 oz  236 lb 5.3 oz  Weight (kg) 105.1 kg 107.4 kg 107.2 kg      Telemetry    AFib 120;s, infrequent PVCs, couple - Personally Reviewed  ECG    No new EKgs - Personally Reviewed  Physical Exam   GEN: No acute distress.   Neck: No JVD Cardiac: irreg-irreg, tachycardic, no murmurs, rubs, or gallops.  Respiratory: CTA b/l. GI: Soft, nontender to light palp, non-distended  MS: No edema; No deformity. Neuro:  Nonfocal  Psych: Normal affect   Labs    High Sensitivity Troponin:  No results for input(s): TROPONINIHS in the last 720 hours.    Chemistry Recent Labs  Lab 06/22/19 0353 06/23/19 0605 06/24/19 0324  NA 135 136 134*  K 3.0* 3.7 3.6  CL 102 95* 93*  CO2 23 28 27   GLUCOSE 158* 132* 137*  BUN <5* 6* 7*  CREATININE 0.71 0.95 1.22*  CALCIUM 6.9* 8.9 9.1  GFRNONAA >60 >60 46*  GFRAA >60 >60 54*  ANIONGAP 10 13 14      Hematology Recent Labs  Lab 06/21/19 0405 06/22/19 0353 06/23/19 1145  WBC 7.6 7.7 7.5  RBC 4.28 4.50 4.76  HGB 11.6* 12.0 12.9  HCT 38.9 40.2 42.1  MCV 90.9 89.3 88.4  MCH 27.1 26.7 27.1  MCHC 29.8* 29.9* 30.6  RDW 16.1* 16.1* 16.3*  PLT 364 354 470*    BNPNo results for input(s): BNP, PROBNP in the last 168 hours.   DDimer No results for input(s): DDIMER in the last 168 hours.   Radiology    No results found.  Cardiac Studies    06/20/2019: c.MRI IMPRESSION: 1.  Mildly dilated LV with diffuse moderate hypokinesis, EF 34%. 2. Normal RV size with moderately decreased systolic function, EF [id number]. 3. No myocardial LGE, so no definitive evidence for prior MI, infiltrative disease, or myocarditis.   06/17/2019; LHC IMPRESSION: Melinda Mckinney has clean coronary arteries and severely elevated LVEDP and filling pressures.  She has a nonischemic cardiomyopathy.  It is difficult to tell whether this is tachycardia mediated from her A. fib related to COVID-19 or another etiology.  She did receive 80 mg of IV Lasix in the Cath Lab and put out 600 cc  of urine.  The right common femoral artery and veins were closed with [facility] devices successfully.  Patient left lab in stable condition.  11/12020: TTE IMPRESSIONS  1. Left ventricular ejection fraction, by visual estimation, is 25 to 30%. The left ventricle has severely decreased function. There is no left ventricular hypertrophy.  2. Left ventricular diastolic parameters are indeterminate.  3. The left ventricle demonstrates global hypokinesis.  4. Global right ventricle has mildly reduced systolic function.The right ventricular size is normal. No increase in right ventricular wall thickness.  5. Left atrial size was moderately dilated.  6. Right atrial size was mildly dilated.  7. The mitral valve is normal in structure. Moderate mitral valve regurgitation. No evidence of mitral stenosis.  8. The tricuspid valve is normal in structure. Tricuspid valve regurgitation moderate.  9. The aortic valve is normal in structure. Aortic valve regurgitation is mild. No evidence of aortic valve sclerosis or stenosis. 10. The pulmonic valve was normal in structure. Pulmonic valve regurgitation is mild. 11. Aortic dilatation noted. 12. There is mild dilatation of the ascending aorta measuring 39 mm. 13. Mildly elevated pulmonary artery systolic pressure. 14. The inferior vena cava is normal in size with greater than 50% respiratory variability, suggesting right atrial pressure of 3 mmHg.  Patient Profile     63 y.o. female w/PMHx of CAD (PCI 2010), chronic CHF (combined), Paroxysmal AFib, HTN, hypothyroid, pre-DM, stage I cervical cancer. Of late unfortunately diagnosed w/ COVID 19 in Sep 2020 and was treated at [place] infection c/b PNAand gastroenteritis. Admitted to [facility] 10/27 for diverticulitis and afib w/ RVR and found to have worsening systolic function w/ EF now at 25-30%. Subsequent LHC showed widely patent coronaries. RHC c/w severely elevated filling pressures and low CO.   Prior, she had paroxysmal atrial fibrillation and was maintained on sotalol.  She is now off sotalol.  Afib has been difficult to rate control had DCCV 11/4 back to NSR.  Had ERAF in 140s on 11/5.  Recardioverted on 11/6. Again with ERAF,  RVR   EP was consulted yesterday, seen by Dr. Rayann Mckinney who knows her well. Recommended and pt agreeable to CRT-P and AV node ablation, planned for today  Assessment & Plan    1. Paroxysmal AFib 2. RVR         CHA2DS2Vasc is 5, on xarelto, appropriately dosed     She has had now more persistent AFib associated with acute illness in the last  month or 2.  Rates uncontrolled DCCV x2 with ERAF and RVR this admission Planned for CRT-P and AV node ablation with Dr. Lovena Le today  In review of Dr. Jackalyn Lombard note, CM, worsening EF suspect 2/2 to RVR, given c.MRI without uptake or other findings, would expect her EF to recover with rate control/CRT pacing, therefor not recommended ICD.  I saw the patient today using [employer] Spanish interpretor Mariela # [phone] The pt reports she had opprortunity to see Dr. Lovena Le, has no follow up questions, and remains agreeable to proceed  Hold xarelto   3. Acute on chronic CHF     Worsened LVEF      C/w AHF team  4. COVID + 04/30/2019 and 05/24/2019, respiratory panel negative otherwise on 05/26/2019     Treated  9/15-9/17 after presenting with GI symptoms and fever, she was found to have COVID-19. She was managed with supportive care and subsequently discharged home on Decadron. For 6 days prior to this current hospital admission-patient developed nausea, vomiting, upper abdominal pain and exertional dyspnea. Was found to have A. fib with RVR, chest x-ray was positive for multifocal infiltrates-CT of the abdomen was negative for acute abnormalities-patient was started on Cardizem infusion-and subsequently  Did have elevated CRP for which she was on steroids and has been tapered off.   Was completely symptom-free on room  air for 3 to 4 days, no diarrhea and no distress whatsoever, and discharged on her home medications       Discharged 05/28/2019   For questions or updates, please contact [place] Please consult [email or web address] for contact info under   Signed, Baldwin Jamaica, PA-C  06/24/2019, 7:22 AM    EP Attending  Patient seen and examined. Agree with above. The patient presents for AV node ablation and biv ppm insertion due to persistent atrial fib. I have discussed the indications/risks/benefits/goals/expectations and she wishes to proceed.  Mikle Bosworth.D.

## 2019-06-24 NOTE — Progress Notes (Signed)
Orthopedic Tech Progress Note Patient Details:  Melinda Mckinney Dec 26, 1955 [id number] Patient already has arm sling. Patient ID: Melinda Mckinney, female   DOB: [birth year], 63 y.o.   MRN: [id number]   Braulio Bosch 06/24/2019, 1:21 PM

## 2019-06-24 NOTE — Progress Notes (Signed)
Patient ID: Melinda Mckinney, female   DOB: 1956/03/22, 63 y.o.   MRN: [id number]     Advanced Heart Failure Rounding Note  PCP-Cardiologist: Skeet Latch, MD   Subjective:    Echo 06/16/19 with severely reduced EF, 25-30% (ehcho 2018 EF 45-50%). LHC 11/2 w/ normal cors. RHC w/ high filling pressures and low CO.   Started on milrinone 11/2 for low CO. CI 1.6 on RHC. Initial Co-ox was 60%. Hospital course c/b afib w/ RVR. Milrinone discontinued 11/4. On digoxin. Co-ox good off of milrinone at 76.3%.   S/p TEE/DCCV 11/4. Cardioverted 4 times, using sternal pressure after the first attempt.   Cardioverted at [place]. She remained in NSR until 11/5, then back to afib/RVR with HR 140s. Cardioverted again 11/6. Went back to AF overnight on 11/6  Remains in AF with RVR with rates in 130s. Mild dyspnea today.    Co-ox 62%, CVP 5 on my read.    - TEE: EF 15% (in setting of rapid afib), moderate LV dilation, moderately dilated/dysfunctional RV, moderate MR.  - Cardiac MRI: LV EF 34%, RV EF 30%, no LGE.    Objective:   Weight Range: 105.1 kg Body mass index is 38.56 kg/m.   Vital Signs:   Temp:  [98.7 F (37.1 C)-99.1 F (37.3 C)] 99.1 F (37.3 C) (11/08 1929) Pulse Rate:  [62-122] 68 (11/09 0828) Resp:  [17-22] 17 (11/09 0828) BP: (112-123)/(79-107) 123/86 (11/09 0828) SpO2:  [94 %-98 %] 98 % (11/09 0936) Last BM Date: 06/24/19  Weight change: Filed Weights   06/20/19 0500 06/21/19 0500 06/22/19 0500  Weight: 107.2 kg 107.4 kg 105.1 kg    Intake/Output:   Intake/Output Summary (Last 24 hours) at 06/24/2019 1051 Last data filed at 06/24/2019 1000 Gross per 24 hour  Intake 1531.63 ml  Output 200 ml  Net 1331.63 ml      Physical Exam   General: NAD Neck: No JVD, no thyromegaly or thyroid nodule.  Lungs: Clear to auscultation bilaterally with normal respiratory effort. CV: Nondisplaced PMI.  Heart tachy, irregular S1/S2, no S3/S4, no murmur.  No peripheral edema.   Abdomen:  Soft, nontender, no hepatosplenomegaly, no distention.  Skin: Intact without lesions or rashes.  Neurologic: Alert and oriented x 3.  Psych: Normal affect. Extremities: No clubbing or cyanosis.  HEENT: Normal.    Telemetry   Atrial fibrillation 130s. Personally reviewed  Labs    CBC Recent Labs    06/22/19 0353 06/23/19 1145  WBC 7.7 7.5  HGB 12.0 12.9  HCT 40.2 42.1  MCV 89.3 88.4  PLT 354 [id number]   Basic Metabolic Panel Recent Labs    06/22/19 0353 06/23/19 0605 06/24/19 0324  NA 135 136 134*  K 3.0* 3.7 3.6  CL 102 95* 93*  CO2 23 28 27   GLUCOSE 158* 132* 137*  BUN <5* 6* 7*  CREATININE 0.71 0.95 1.22*  CALCIUM 6.9* 8.9 9.1  MG 3.2*  --   --    Liver Function Tests No results for input(s): AST, ALT, ALKPHOS, BILITOT, PROT, ALBUMIN in the last 72 hours. No results for input(s): LIPASE, AMYLASE in the last 72 hours. Cardiac Enzymes No results for input(s): CKTOTAL, CKMB, CKMBINDEX, TROPONINI in the last 72 hours.  BNP: BNP (last 3 results) Recent Labs    05/26/19 0720 05/27/19 0245 05/28/19 0630  BNP 604.0* 330.7* 345.9*    ProBNP (last 3 results) No results for input(s): PROBNP in the last 8760 hours.   D-Dimer No results for  input(s): DDIMER in the last 72 hours. Hemoglobin A1C No results for input(s): HGBA1C in the last 72 hours. Fasting Lipid Panel No results for input(s): CHOL, HDL, LDLCALC, TRIG, CHOLHDL, LDLDIRECT in the last 72 hours. Thyroid Function Tests No results for input(s): TSH, T4TOTAL, T3FREE, THYROIDAB in the last 72 hours.  Invalid input(s): FREET3  Other results:   Imaging    No results found.   Medications:     Scheduled Medications:  [MAR Hold] allopurinol  100 mg Oral QHS   [MAR Hold] atorvastatin  40 mg Oral QHS   [MAR Hold] Chlorhexidine Gluconate Cloth  6 each Topical Daily   [MAR Hold] digoxin  0.125 mg Oral Daily   [MAR Hold] DULoxetine  60 mg Oral Daily   [MAR Hold] Gerhardt's butt cream    Topical QID   [MAR Hold] insulin aspart  0-9 Units Subcutaneous Q4H   [MAR Hold] levothyroxine  125 mcg Oral QAC breakfast   [MAR Hold] losartan  12.5 mg Oral BID   [MAR Hold] magic mouthwash  15 mL Oral QID   [MAR Hold] nystatin   Topical BID   [MAR Hold] pantoprazole  40 mg Oral BID   [MAR Hold] raloxifene  60 mg Oral QHS   [MAR Hold] sodium chloride flush  3 mL Intravenous Q12H   [MAR Hold] spironolactone  25 mg Oral Daily   [MAR Hold] torsemide  40 mg Oral Daily    Infusions:  sodium chloride     sodium chloride     sodium chloride     amiodarone Stopped (06/24/19 1045)   heparin     vancomycin 1,500 mg (06/24/19 0917)    PRN Medications: [MAR Hold] acetaminophen, [MAR Hold] cyclobenzaprine, fentaNYL, heparin, [MAR Hold]  HYDROmorphone (DILAUDID) injection, iohexol, lidocaine (PF), midazolam, [MAR Hold]  morphine injection, [MAR Hold] ondansetron (ZOFRAN) IV, [MAR Hold] ondansetron **OR** [DISCONTINUED] ondansetron (ZOFRAN) IV, [MAR Hold] sodium chloride flush, [MAR Hold] sodium chloride flush, [MAR Hold] sodium chloride flush, [MAR Hold] temazepam    Patient Profile   63 y/o female w/ h/o CAD s/p PCI in 2010, chronic combined systolic and diastolic HF (EF [id number] in 2018), atrial fibrillation on chronic a/c w/ Xarelto, HTN, prediabetes, hypothyroidism and diagnosed w/ COVID 19 in Sep 2020 and was treated at [place] infection c/b PNA and gastroenteritis. Admitted to [facility] 10/27 for diverticulitis and afib w/ RVR and found to have worsening systolic function w/ EF now at 25-30%. Subsequent LHC showed widely patent coronaries. RHC c/w severely elevated filling pressures and low CO.   Assessment/Plan   1. Acute systolic CHF:  Nonischemic cardiomyopathy.  Echo in 2018 with EF 45-50%.  Echo this admission with EF 25-30%, diffuse hypokinesis, mildly decreased RV systolic function, moderate MR. LHC/RHC this admission with no significant coronary  disease, markedly elevated filling pressures and low cardiac output (CI 1.6).  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 Cardiac MRI 11/5 showed LVEF 34%, RVEF 30%, no LGE => perhaps pointing to tachy-mediated CMP as more likely etiology.  Initially required inotropic support w/ milrinone but discontinued 11/4. Co-ox 62%, CVP 5.  She is not volume overloaded on exam.  Creatinine 1.22.  - Continue torsemide 40 daily - Continue losartan 12.5 mg bid.    - Continue spironolactone to 25 daily.  - Continue digoxin 0.125 daily, level ok  2. Atrial fibrillation with RVR: Persistent atrial fibrillation, it appears, at least since  9/20 when she was admitted with COVID-19. Prior, she had paroxysmal atrial fibrillation and was maintained on sotalol. She is now off sotalol.  Afib has been difficult to rate control, initially had DCCV 11/4 back to NSR.  Went back to afib/RVR in 140s on 11/5. Recardioverted on 11/6. Now back in AF with RVR again. Unable to get rate down despite amiodarone gtt and boluses     - On amiodarone 60 mg/hr, continue.  - Continue Xarelto 20 mg daily   - Plan for AV nodal ablation with BiV pacing today as unable to keep in NSR or control rate when in AF. - Can stop ranolazine since getting AVN ablation today.  - given body habitus, needs outpatient sleep study if no previous evaluation  3. Mitral regurgitation: Functional MR, moderate on TEE.  In NSR on cardiac MRI, MR was less impressive. Can re-assess after AF controlled.  4. Diverticulitis: Afebrile, WBCs not elevated. Cefdinir and Flagyl course has been completed.  5. Hypothyroidism: on levothyroxine.  6. Hypokalemia: K 3.6 today, supplement.  7. Hyperglycemia/Prediabetes: CBGs improving. Hgb A1c 04/2019 was 6.1  -management per IM    [name] MD 06/24/2019 10:51 AM

## 2019-06-24 NOTE — Plan of Care (Signed)

## 2019-06-24 NOTE — Progress Notes (Signed)
PROGRESS NOTE  Melinda Mckinney [id number] DOB: 1955-10-11 DOA: 06/11/2019 PCP: Colon Branch, MD  HPI/Recap of past 24 hours: Melinda Mckinney a 63 y.o.femalewith medical history significant ofDM2, A.Fib on xarelto, CAD s/p stent.  Patient originally diagnosed with COVID19 on 9/15. Admitted with gastroenteritis due to COVID 10/9-10/13.  Developed worsening abd pain later in the month, CT abd/pelvis on 10/22 showed new acute diverticulitis not present on CT 10/9. She was put on cipro/flagyl.  She continued to have left lower quadrant abdominal pain so came back to the emergency room. In the emergency room, repeat CT scan of the abdomen pelvis showed improvement of diverticulitis.  Blood pressure was soft.  Lactate was 2.7.  Started on antibiotics.  Found to be on A. fib with RVR and treated with Cardizem drip. Echocardiogram showed severely reduced ejection fraction to 25%.  Underwent cardiac catheterization with normal coronaries.  Patient was started on milrinone, amiodarone and Lasix and admitted to progressive care unit.  06/19/2019: Remains persistent A. fib with RVR, underwent TEE and cardioversion with successful conversion to sinus rhythm and remained on amiodarone drip.  EF is 15%. 06/21/2019: Patient went back to rapid A. fib still on amnio drip.  Underwent another TEE cardioversion with success , again went into rapid Afib overnight.   06/24/19: Patient seen and examined at her bedside this morning.  No acute events overnight.  Still in A. fib with RVR.  Planned AV node ablation and ICD implant today.   Assessment/Plan: Principal Problem:   Sepsis ([place]) Active Problems:   Essential hypertension   Coronary artery disease involving native coronary artery of native heart with angina pectoris (HCC)   Atrial fibrillation with RVR (HCC)   Acute on chronic systolic (congestive) heart failure ([place])   COVID-19   Diverticulitis  Paroxysmal atrial fibrillation with RVR Persistent and  recurrent rapid A. fib, cardioversion on 06/19/2019 and repeat on 06/21/2019 but unsuccessful to maintain sinus rhythm. She may need ablation.  On amiodarone drip and digoxin On Xarelto for anticoagulation. Management per cardiology Currently asymptomatic with heart rate 120-130's Planned AV node ablation today and BiV PPM insertion.  Acute on chronic systolic CHF, nonischemic cardiomyopathy. Patient with reduced EF of less than 20%.  Previous EF of 45-50% from 2018. Unknown etiology. Recent Covid infection and currently in atrial fibrillation.  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  MRI with normal myocardium. Was on IV Lasix with good response and diuresing and now converted to torsemide and aldactone. Added losartan.    Blood pressures are soft. Unclear if urine output is accurate  continue strict I's and O's and daily weight  continue to monitor electrolytes, renal function, and blood pressure  AKI on CKD 3 Baseline creatinine appears to be 0.71 with GFR greater than 60 Creatinine 1.22 with GFR 46 on 06/24/2019 Avoid nephrotoxins like NSAIDs, and hypotension Closely monitor urine output and electrolytes Daily BMPs  Resolved acute sigmoid diverticulitis Failed outpatient treatment with ciprofloxacin/flagyl.  Symptoms improved.   Already received prolonged treatment.   Finished therapy.  Symptoms improved.     Essential hypertension/soft blood pressures Blood pressure soft and stable. Continue to monitor vital signs  Pre-diabetes No evidence of diabetes on chart review. Highest hemoglobin A1C is 6.1%. Recommend diet modification.  Hypothyroidism Recent TSH of 1.6 -Continue Synthroid  Recent COVID-19 pneumonia Symptoms improved.  O2 saturation 96% on room air On 2 L of oxygen by nasal cannula as needed at baseline  DVT prophylaxis:Xarelto Code Status:Full Code  Family Communication: None Disposition Plan: Pending clinical  improvement.  Remains in progressive care unit.  Anticipate discharge home with home health care after clinical improvement and cardiology signs off.  Consultants:  Cardiology  Procedures:  11/1: Transthoracic Echocardiogram IMPRESSIONS   1. Left ventricular ejection fraction, by visual estimation, is 25 to 30%. The left ventricle has severely decreased function. There is no left ventricular hypertrophy. 2. Left ventricular diastolic parameters are indeterminate. 3. The left ventricle demonstrates global hypokinesis. 4. Global right ventricle has mildly reduced systolic function.The right ventricular size is normal. No increase in right ventricular wall thickness. 5. Left atrial size was moderately dilated. 6. Right atrial size was mildly dilated. 7. The mitral valve is normal in structure. Moderate mitral valve regurgitation. No evidence of mitral stenosis. 8. The tricuspid valve is normal in structure. Tricuspid valve regurgitation moderate. 9. The aortic valve is normal in structure. Aortic valve regurgitation is mild. No evidence of aortic valve sclerosis or stenosis. 10. The pulmonic valve was normal in structure. Pulmonic valve regurgitation is mild. 11. Aortic dilatation noted. 12. There is mild dilatation of the ascending aorta measuring 39 mm. 13. Mildly elevated pulmonary artery systolic pressure. 14. The inferior vena cava is normal in size with greater than 50% respiratory variability, suggesting right atrial pressure of 3 mmHg.  FINDINGS Left Ventricle: Left ventricular ejection fraction, by visual estimation, is 25 to 30%. The left ventricle has severely decreased function. The left ventricle demonstrates global hypokinesis. There is no left ventricular hypertrophy. Left ventricular  diastolic parameters are indeterminate. Normal left atrial pressure.  Right Ventricle: The right ventricular size is normal. No increase in right ventricular wall thickness.  Global RV systolic function is has mildly reduced systolic function. The tricuspid regurgitant velocity is 2.46 m/s, and with an assumed right atrial  pressure of 8 mmHg, the estimated right ventricular systolic pressure is mildly elevated at 32.2 mmHg.  Left Atrium: Left atrial size was moderately dilated.  Right Atrium: Right atrial size was mildly dilated  Pericardium: There is no evidence of pericardial effusion.  Mitral Valve: The mitral valve is normal in structure. No evidence of mitral valve stenosis by observation. Moderate mitral valve regurgitation.  Tricuspid Valve: The tricuspid valve is normal in structure. Tricuspid valve regurgitation moderate.  Aortic Valve: The aortic valve is normal in structure. Aortic valve regurgitation is mild. Aortic regurgitation PHT measures 835 msec. The aortic valve is structurally normal, with no evidence of sclerosis or stenosis.  Pulmonic Valve: The pulmonic valve was normal in structure. Pulmonic valve regurgitation is mild.  Aorta: Aortic dilatation noted. There is mild dilatation of the ascending aorta measuring 39 mm.  Venous: The inferior vena cava is normal in size with greater than 50% respiratory variability, suggesting right atrial pressure of 3 mmHg.  IAS/Shunts: No atrial level shunt detected by color flow Doppler. No ventricular septal defect is seen or detected. There is no evidence of an atrial septal defect.  Antimicrobials:  Ceftriaxone (10/27>> 06/20/2019  Flagyl (10/27>> 06/20/2019    Objective: Vitals:   06/24/19 1315 06/24/19 1330 06/24/19 1345 06/24/19 1400  BP: 120/69 (!) 147/81 123/68 123/69  Pulse: 89 89 89 89  Resp: 15 19 18 16   Temp:      TempSrc:      SpO2: 95% 98% 99% 94%  Weight:      Height:        Intake/Output Summary (Last 24 hours) at 06/24/2019 1406 Last data filed at 06/24/2019 1000 Gross per 24  hour  Intake 1158.15 ml  Output 200 ml  Net 958.15 ml   Filed Weights    06/20/19 0500 06/21/19 0500 06/22/19 0500  Weight: 107.2 kg 107.4 kg 105.1 kg    Exam:  . General: 63 y.o. year-old female pleasant well-developed well-nourished no acute distress.  Alert oriented x4.  Cardiovascular: Tachycardic with no rubs or gallops. Marland Kitchen Respiratory: Clear to oscillation no wheezes or rales.   . Abdomen: Soft nontender nondistended no bowel sounds. . Musculoskeletal: Trace lower extremity edema.   Psychiatry: Mood is appropriate for condition and setting.  Data Reviewed: CBC: Recent Labs  Lab 06/19/19 0241 06/19/19 0348 06/20/19 0445 06/21/19 0405 06/22/19 0353 06/23/19 1145  WBC 7.6  --  6.4 7.6 7.7 7.5  HGB 11.2* 12.2 10.8* 11.6* 12.0 12.9  HCT 36.7 36.0 35.6* 38.9 40.2 42.1  MCV 89.1  --  90.1 90.9 89.3 88.4  PLT 322  --  285 364 354 [id number]   Basic Metabolic Panel: Recent Labs  Lab 06/18/19 2237  06/19/19 0241  06/20/19 0445 06/21/19 0405 06/22/19 0353 06/23/19 0605 06/24/19 0324  NA 134*   < > 134*   < > 136 134* 135 136 134*  K 3.4*   < > 4.0   < > 3.9 3.7 3.0* 3.7 3.6  CL 94*  --  92*  --  97* 96* 102 95* 93*  CO2 29  --  28  --  27 26 23 28 27   GLUCOSE 270*  --  273*  --  177* 204* 158* 132* 137*  BUN 5*  --  6*  --  7* <5* <5* 6* 7*  CREATININE 0.81  --  0.78  --  0.78 0.77 0.71 0.95 1.22*  CALCIUM 8.2*  --  8.3*  --  8.5* 8.6* 6.9* 8.9 9.1  MG 2.2  --  2.0  --  1.9 1.9 3.2*  --   --    < > = values in this interval not displayed.   GFR: Estimated Creatinine Clearance: 55.3 mL/min (A) (by C-G formula based on SCr of 1.22 mg/dL (H)). Liver Function Tests: No results for input(s): AST, ALT, ALKPHOS, BILITOT, PROT, ALBUMIN in the last 168 hours. No results for input(s): LIPASE, AMYLASE in the last 168 hours. No results for input(s): AMMONIA in the last 168 hours. Coagulation Profile: No results for input(s): INR, PROTIME in the last 168 hours. Cardiac Enzymes: No results for input(s): CKTOTAL, CKMB, CKMBINDEX, TROPONINI in the last 168  hours. BNP (last 3 results) No results for input(s): PROBNP in the last 8760 hours. HbA1C: No results for input(s): HGBA1C in the last 72 hours. CBG: Recent Labs  Lab 06/23/19 1619 06/24/19 0107 06/24/19 0631 06/24/19 1147 06/24/19 1223  GLUCAP 129* 158* 145* 136* 120*   Lipid Profile: No results for input(s): CHOL, HDL, LDLCALC, TRIG, CHOLHDL, LDLDIRECT in the last 72 hours. Thyroid Function Tests: No results for input(s): TSH, T4TOTAL, FREET4, T3FREE, THYROIDAB in the last 72 hours. Anemia Panel: No results for input(s): VITAMINB12, FOLATE, FERRITIN, TIBC, IRON, RETICCTPCT in the last 72 hours. Urine analysis:    Component Value Date/Time   COLORURINE AMBER (A) 06/11/2019 1526   APPEARANCEUR CLEAR 06/11/2019 1526   LABSPEC >1.046 (H) 06/11/2019 1526   PHURINE 6.0 06/11/2019 1526   GLUCOSEU NEGATIVE 06/11/2019 1526   GLUCOSEU NEGATIVE 06/06/2019 1538   HGBUR SMALL (A) 06/11/2019 1526   BILIRUBINUR NEGATIVE 06/11/2019 [place] 06/11/2019 1526   PROTEINUR 30 (A)  06/11/2019 1526   UROBILINOGEN 0.2 06/06/2019 1538   NITRITE NEGATIVE 06/11/2019 1526   LEUKOCYTESUR TRACE (A) 06/11/2019 1526   Sepsis Labs: @LABRCNTIP (procalcitonin:4,lacticidven:4)  ) Recent Results (from the past 240 hour(s))  Surgical PCR screen     Status: None   Collection Time: 06/23/19 11:31 AM   Specimen: Nasal Mucosa; Nasal Swab  Result Value Ref Range Status   MRSA, PCR NEGATIVE NEGATIVE Final   Staphylococcus aureus NEGATIVE NEGATIVE Final    Comment: (NOTE) The Xpert SA Assay (FDA approved for NASAL specimens in patients 70 years of age and older), is one component of a comprehensive surveillance program. It is not intended to diagnose infection nor to guide or monitor treatment. Performed at [facility], [place] [address], [place], [place] [address]       Studies: No results found.  Scheduled Meds: . allopurinol  100 mg Oral QHS  . atorvastatin  40 mg Oral QHS   . Chlorhexidine Gluconate Cloth  6 each Topical Daily  . digoxin  0.125 mg Oral Daily  . DULoxetine  60 mg Oral Daily  . Gerhardt's butt cream   Topical QID  . insulin aspart  0-9 Units Subcutaneous Q4H  . levothyroxine  125 mcg Oral QAC breakfast  . losartan  12.5 mg Oral BID  . magic mouthwash  15 mL Oral QID  . nystatin   Topical BID  . pantoprazole  40 mg Oral BID  . raloxifene  60 mg Oral QHS  . sodium chloride flush  3 mL Intravenous Q12H  . sodium chloride flush  3 mL Intravenous Q12H  . spironolactone  25 mg Oral Daily  . torsemide  40 mg Oral Daily    Continuous Infusions: . sodium chloride    . sodium chloride    . amiodarone Stopped (06/24/19 1045)  . vancomycin       LOS: 13 days     Kayleen Memos, MD [facility] Pager [phone]  If 7PM-7AM, please contact night-coverage [email or web address] Password [email or web address] 06/24/2019, 2:06 PM

## 2019-06-25 ENCOUNTER — Inpatient Hospital Stay (HOSPITAL_COMMUNITY): Payer: Medicare Other

## 2019-06-25 LAB — BASIC METABOLIC PANEL
Anion gap: 12 (ref 5–15)
BUN: 8 mg/dL (ref 8–23)
CO2: 27 mmol/L (ref 22–32)
Calcium: 9.3 mg/dL (ref 8.9–10.3)
Chloride: 98 mmol/L (ref 98–111)
Creatinine, Ser: 1.13 mg/dL — ABNORMAL HIGH (ref 0.44–1.00)
GFR calc Af Amer: 59 mL/min — ABNORMAL LOW (ref >60)
GFR calc non Af Amer: 51 mL/min — ABNORMAL LOW (ref >60)
Glucose, Bld: 123 mg/dL — ABNORMAL HIGH (ref 70–99)
Potassium: 4.3 mmol/L (ref 3.5–5.1)
Sodium: 137 mmol/L (ref 135–145)

## 2019-06-25 LAB — GLUCOSE, CAPILLARY
Glucose-Capillary: 110 mg/dL — ABNORMAL HIGH (ref 70–99)
Glucose-Capillary: 111 mg/dL — ABNORMAL HIGH (ref 70–99)
Glucose-Capillary: 118 mg/dL — ABNORMAL HIGH (ref 70–99)
Glucose-Capillary: 123 mg/dL — ABNORMAL HIGH (ref 70–99)
Glucose-Capillary: 124 mg/dL — ABNORMAL HIGH (ref 70–99)
Glucose-Capillary: 133 mg/dL — ABNORMAL HIGH (ref 70–99)
Glucose-Capillary: 150 mg/dL — ABNORMAL HIGH (ref 70–99)

## 2019-06-25 MED ORDER — ALTEPLASE 2 MG IJ SOLR
2.0000 mg | Freq: Once | INTRAMUSCULAR | Status: AC
  Administered 2019-06-25: 2 mg

## 2019-06-25 MED ORDER — CARVEDILOL 3.125 MG PO TABS: 3.1250 mg | ORAL_TABLET | Freq: Two times a day (BID) | ORAL | Status: DC

## 2019-06-25 MED ORDER — SACUBITRIL-VALSARTAN 24-26 MG PO TABS
1.0000 | ORAL_TABLET | Freq: Two times a day (BID) | ORAL | Status: DC
  Administered 2019-06-25 – 2019-06-26 (×2): 1 via ORAL
  Filled 2019-06-25 (×2): qty 1

## 2019-06-25 MED ORDER — CARVEDILOL 3.125 MG PO TABS
3.1250 mg | ORAL_TABLET | Freq: Two times a day (BID) | ORAL | Status: DC
  Administered 2019-06-25 – 2019-06-26 (×3): 3.125 mg via ORAL
  Filled 2019-06-25 (×3): qty 1

## 2019-06-25 MED FILL — Gentamicin Sulfate Inj 40 MG/ML: INTRAMUSCULAR | Qty: 80 | Status: AC

## 2019-06-25 MED FILL — Lidocaine HCl Local Inj 1%: INTRAMUSCULAR | Qty: 60 | Status: AC

## 2019-06-25 MED FILL — Midazolam HCl Inj 5 MG/5ML (Base Equivalent): INTRAMUSCULAR | Qty: 5 | Status: AC

## 2019-06-25 NOTE — Progress Notes (Signed)
PROGRESS NOTE  Melinda Mckinney [id number] DOB: 1956/05/20 DOA: 06/11/2019 PCP: Colon Branch, MD  HPI/Recap of past 24 hours: Melinda Mckinney a 63 y.o.femalewith medical history significant ofDM2, A.Fib on xarelto, CAD s/p stent.  Patient originally diagnosed with COVID19 on 9/15. Admitted with gastroenteritis due to COVID 10/9-10/13.  Developed worsening abd pain later in the month, CT abd/pelvis on 10/22 showed new acute diverticulitis not present on CT 10/9. She was put on cipro/flagyl.  She continued to have left lower quadrant abdominal pain so came back to the emergency room. In the emergency room, repeat CT scan of the abdomen pelvis showed improvement of diverticulitis.  Blood pressure was soft.  Lactate was 2.7.  Started on antibiotics.  Found to be on A. fib with RVR and treated with Cardizem drip. Echocardiogram showed severely reduced ejection fraction to 25%.  Underwent cardiac catheterization with normal coronaries.  Patient was started on milrinone, amiodarone and Lasix and admitted to progressive care unit.  06/19/2019: Remains persistent A. fib with RVR, underwent TEE and cardioversion with successful conversion to sinus rhythm and remained on amiodarone drip.  EF is 15%. 06/21/2019: Patient went back to rapid A. fib still on amnio drip.  Underwent another TEE cardioversion with success , again went into rapid Afib overnight.   Post AV node ablation and ICD implant on 06/24/19.  06/25/19: Patient was seen and examined at her bedside this morning.  She reports left upper chest wall pain at the site of previous IV access.  She has no other complaints.  Cardiac medications being adjusted by cardiology.  Entresto added, has not yet received a dose.  Will monitor on new cardiac medication and regimen.  Patient off Xarelto with plan to restart on Saturday 06/29/19.   Assessment/Plan: Principal Problem:   Sepsis ([place]) Active Problems:   Essential hypertension   Coronary artery  disease involving native coronary artery of native heart with angina pectoris (HCC)   Atrial fibrillation with RVR (HCC)   Acute on chronic systolic (congestive) heart failure ([place])   COVID-19   Diverticulitis  Post AV nodal ablation of paroxysmal atrial fibrillation with RVR Per cardiology: Persistent atrial fibrillation, it appears, at least since 9/20 when she was admitted with COVID-19. Prior, she had paroxysmal atrial fibrillation and was maintained on sotalol. She is now off sotalol.  Afib has been difficult to rate control, initially had DCCV 11/4 back to NSR.  Went back to afib/RVR in 140s on 11/5. Recardioverted on 11/6. Went back into AF with RVR again. Unable to get rate down despite amiodarone gtt and boluses.  Therefore, she had AV nodal ablation with BiV pacing.  - Now off amiodarone.   - Restart Xarelto 20 mg daily on Saturday per EP.   Acute on chronic systolic CHF, nonischemic cardiomyopathy. Patient with reduced EF of less than 25-30%n (06/16/19).  Previous EF of 45-50% from 2018. Unknown etiology. Recent Covid infection and currently in atrial fibrillation.  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  MRI with normal myocardium.  continue strict I's and O's and daily weight  continue to monitor electrolytes, renal function, and blood pressure Recommendations per cardiology: - Continue torsemide 40 daily - Transition to Entresto 24/26 bid.    - Continue spironolactone to 25 daily.  - Continue digoxin 0.125 daily, level ok  - She is off amiodarone, can start low dose Coreg 3.125 mg bid.   Improving AKI on CKD 3 Baseline creatinine appears to be 0.71 with GFR greater  than 60 Creatinine 1.22 with GFR 46 on 06/24/2019>> creatinine 1.1 with GFR 51 on 06/25/2019. Continue to avoid nephrotoxins like NSAIDs, and hypotension Continue to closely monitor urine output and electrolytes  Resolved acute sigmoid diverticulitis Failed outpatient treatment with  ciprofloxacin/flagyl.  Symptoms improved.   Already received prolonged treatment.   Completed course of antibiotics.  Symptoms resolved.    Essential hypertension/soft blood pressures Blood pressure currently soft even without Entresto. Continue to closely monitor vital signs.  Pre-diabetes No evidence of diabetes on chart review. Highest hemoglobin A1C is 6.1%. Recommend diet modification.  Hypothyroidism Recent TSH of 1.6 Continue Synthroid  Recent COVID-19 pneumonia Symptoms improved.  O2 saturation 96% on room air On 2 L of oxygen by nasal cannula as needed at baseline  DVT prophylaxis:SCD; resume Xarelto on 06/29/2019. Code Status:Full Code Family Communication: None at bedside. Disposition Plan:  Possible discharge to home on 06/26/2027 if patient is hemodynamically stable on her current cardiac medications.   Consultants:  Cardiology  Procedures:  11/1: Transthoracic Echocardiogram IMPRESSIONS   1. Left ventricular ejection fraction, by visual estimation, is 25 to 30%. The left ventricle has severely decreased function. There is no left ventricular hypertrophy. 2. Left ventricular diastolic parameters are indeterminate. 3. The left ventricle demonstrates global hypokinesis. 4. Global right ventricle has mildly reduced systolic function.The right ventricular size is normal. No increase in right ventricular wall thickness. 5. Left atrial size was moderately dilated. 6. Right atrial size was mildly dilated. 7. The mitral valve is normal in structure. Moderate mitral valve regurgitation. No evidence of mitral stenosis. 8. The tricuspid valve is normal in structure. Tricuspid valve regurgitation moderate. 9. The aortic valve is normal in structure. Aortic valve regurgitation is mild. No evidence of aortic valve sclerosis or stenosis. 10. The pulmonic valve was normal in structure. Pulmonic valve regurgitation is mild. 11. Aortic dilatation  noted. 12. There is mild dilatation of the ascending aorta measuring 39 mm. 13. Mildly elevated pulmonary artery systolic pressure. 14. The inferior vena cava is normal in size with greater than 50% respiratory variability, suggesting right atrial pressure of 3 mmHg.  FINDINGS Left Ventricle: Left ventricular ejection fraction, by visual estimation, is 25 to 30%. The left ventricle has severely decreased function. The left ventricle demonstrates global hypokinesis. There is no left ventricular hypertrophy. Left ventricular  diastolic parameters are indeterminate. Normal left atrial pressure.  Right Ventricle: The right ventricular size is normal. No increase in right ventricular wall thickness. Global RV systolic function is has mildly reduced systolic function. The tricuspid regurgitant velocity is 2.46 m/s, and with an assumed right atrial  pressure of 8 mmHg, the estimated right ventricular systolic pressure is mildly elevated at 32.2 mmHg.  Left Atrium: Left atrial size was moderately dilated.  Right Atrium: Right atrial size was mildly dilated  Pericardium: There is no evidence of pericardial effusion.  Mitral Valve: The mitral valve is normal in structure. No evidence of mitral valve stenosis by observation. Moderate mitral valve regurgitation.  Tricuspid Valve: The tricuspid valve is normal in structure. Tricuspid valve regurgitation moderate.  Aortic Valve: The aortic valve is normal in structure. Aortic valve regurgitation is mild. Aortic regurgitation PHT measures 835 msec. The aortic valve is structurally normal, with no evidence of sclerosis or stenosis.  Pulmonic Valve: The pulmonic valve was normal in structure. Pulmonic valve regurgitation is mild.  Aorta: Aortic dilatation noted. There is mild dilatation of the ascending aorta measuring 39 mm.  Venous: The inferior vena cava is normal in  size with greater than 50% respiratory variability, suggesting right  atrial pressure of 3 mmHg.  IAS/Shunts: No atrial level shunt detected by color flow Doppler. No ventricular septal defect is seen or detected. There is no evidence of an atrial septal defect.  Antimicrobials:  Ceftriaxone (10/27>> 06/20/2019  Flagyl (10/27>> 06/20/2019    Objective: Vitals:   06/25/19 0500 06/25/19 0748 06/25/19 0800 06/25/19 1118  BP:   129/67 111/70  Pulse:   91 88  Resp:   20 19  Temp:  97.7 F (36.5 C)  97.8 F (36.6 C)  TempSrc:  Oral  Oral  SpO2:   93% 96%  Weight: 103.9 kg     Height:        Intake/Output Summary (Last 24 hours) at 06/25/2019 1323 Last data filed at 06/25/2019 1232 Gross per 24 hour  Intake 683 ml  Output -  Net 683 ml   Filed Weights   06/21/19 0500 06/22/19 0500 06/25/19 0500  Weight: 107.4 kg 105.1 kg 103.9 kg    Exam:  . General: 63 y.o. year-old female pleasant well-developed well-nourished in no acute distress.  Alert oriented x4.  Cardiovascular: Regular rate and rhythm no rubs or gallops no JVD or thyromegaly noted. Marland Kitchen Respiratory: Clear to auscultation no wheezes or rales. . Abdomen: Soft nontender nondistended normal bowel sounds present.   . Musculoskeletal: Trace lower extremity edema. Psychiatry: mood is appropriate   Data Reviewed: CBC: Recent Labs  Lab 06/19/19 0241 06/19/19 0348 06/20/19 0445 06/21/19 0405 06/22/19 0353 06/23/19 1145  WBC 7.6  --  6.4 7.6 7.7 7.5  HGB 11.2* 12.2 10.8* 11.6* 12.0 12.9  HCT 36.7 36.0 35.6* 38.9 40.2 42.1  MCV 89.1  --  90.1 90.9 89.3 88.4  PLT 322  --  285 364 354 [id number]*   Basic Metabolic Panel: Recent Labs  Lab 06/18/19 2237  06/19/19 0241  06/20/19 0445 06/21/19 0405 06/22/19 0353 06/23/19 0605 06/24/19 0324 06/25/19 0523  NA 134*   < > 134*   < > 136 134* 135 136 134* 137  K 3.4*   < > 4.0   < > 3.9 3.7 3.0* 3.7 3.6 4.3  CL 94*  --  92*  --  97* 96* 102 95* 93* 98  CO2 29  --  28  --  27 26 23 28 27 27   GLUCOSE 270*  --  273*  --  177* 204* 158*  132* 137* 123*  BUN 5*  --  6*  --  7* <5* <5* 6* 7* 8  CREATININE 0.81  --  0.78  --  0.78 0.77 0.71 0.95 1.22* 1.13*  CALCIUM 8.2*  --  8.3*  --  8.5* 8.6* 6.9* 8.9 9.1 9.3  MG 2.2  --  2.0  --  1.9 1.9 3.2*  --   --   --    < > = values in this interval not displayed.   GFR: Estimated Creatinine Clearance: 59.4 mL/min (A) (by C-G formula based on SCr of 1.13 mg/dL (H)). Liver Function Tests: No results for input(s): AST, ALT, ALKPHOS, BILITOT, PROT, ALBUMIN in the last 168 hours. No results for input(s): LIPASE, AMYLASE in the last 168 hours. No results for input(s): AMMONIA in the last 168 hours. Coagulation Profile: No results for input(s): INR, PROTIME in the last 168 hours. Cardiac Enzymes: No results for input(s): CKTOTAL, CKMB, CKMBINDEX, TROPONINI in the last 168 hours. BNP (last 3 results) No results for input(s): PROBNP in the last  8760 hours. HbA1C: No results for input(s): HGBA1C in the last 72 hours. CBG: Recent Labs  Lab 06/24/19 2016 06/25/19 0017 06/25/19 0430 06/25/19 0813 06/25/19 1120  GLUCAP 137* 111* 124* 133* 123*   Lipid Profile: No results for input(s): CHOL, HDL, LDLCALC, TRIG, CHOLHDL, LDLDIRECT in the last 72 hours. Thyroid Function Tests: No results for input(s): TSH, T4TOTAL, FREET4, T3FREE, THYROIDAB in the last 72 hours. Anemia Panel: No results for input(s): VITAMINB12, FOLATE, FERRITIN, TIBC, IRON, RETICCTPCT in the last 72 hours. Urine analysis:    Component Value Date/Time   COLORURINE AMBER (A) 06/11/2019 1526   APPEARANCEUR CLEAR 06/11/2019 1526   LABSPEC >1.046 (H) 06/11/2019 1526   PHURINE 6.0 06/11/2019 1526   GLUCOSEU NEGATIVE 06/11/2019 1526   GLUCOSEU NEGATIVE 06/06/2019 1538   HGBUR SMALL (A) 06/11/2019 1526   BILIRUBINUR NEGATIVE 06/11/2019 1526   KETONESUR NEGATIVE 06/11/2019 1526   PROTEINUR 30 (A) 06/11/2019 1526   UROBILINOGEN 0.2 06/06/2019 1538   NITRITE NEGATIVE 06/11/2019 1526   LEUKOCYTESUR TRACE (A)  06/11/2019 1526   Sepsis Labs: @LABRCNTIP (procalcitonin:4,lacticidven:4)  ) Recent Results (from the past 240 hour(s))  Surgical PCR screen     Status: None   Collection Time: 06/23/19 11:31 AM   Specimen: Nasal Mucosa; Nasal Swab  Result Value Ref Range Status   MRSA, PCR NEGATIVE NEGATIVE Final   Staphylococcus aureus NEGATIVE NEGATIVE Final    Comment: (NOTE) The Xpert SA Assay (FDA approved for NASAL specimens in patients 69 years of age and older), is one component of a comprehensive surveillance program. It is not intended to diagnose infection nor to guide or monitor treatment. Performed at [facility], [place] [address], [place], [place] [address]       Studies: Dg Chest 2 View  Result Date: 06/25/2019 CLINICAL DATA:  Patient status post pacemaker placement 06/24/2019. EXAM: CHEST - 2 VIEW COMPARISON:  Single-view of the chest 06/17/2019. FINDINGS: Three lead pacing device is in place with leads in the right atrium, right ventricle and exiting the coronary sinus. Right PICC is noted. Lungs are clear. No pneumothorax or pleural fluid. No acute or focal bony abnormality. IMPRESSION: New pacemaker in place.  Negative for pneumothorax or acute disease. Electronically Signed   By: Inge Rise M.D.   On: 06/25/2019 06:40    Scheduled Meds: . allopurinol  100 mg Oral QHS  . atorvastatin  40 mg Oral QHS  . carvedilol  3.125 mg Oral BID WC  . Chlorhexidine Gluconate Cloth  6 each Topical Daily  . digoxin  0.125 mg Oral Daily  . DULoxetine  60 mg Oral Daily  . Gerhardt's butt cream   Topical QID  . insulin aspart  0-9 Units Subcutaneous Q4H  . levothyroxine  125 mcg Oral QAC breakfast  . magic mouthwash  15 mL Oral QID  . nystatin   Topical BID  . pantoprazole  40 mg Oral BID  . raloxifene  60 mg Oral QHS  . sacubitril-valsartan  1 tablet Oral BID  . sodium chloride flush  3 mL Intravenous Q12H  . sodium chloride flush  3 mL Intravenous Q12H  . spironolactone   25 mg Oral Daily  . torsemide  40 mg Oral Daily    Continuous Infusions: . sodium chloride    . sodium chloride       LOS: 14 days     Kayleen Memos, MD [facility] Pager [phone]  If 7PM-7AM, please contact night-coverage [email or web address] Password [email or web address] 06/25/2019, 1:23  PM

## 2019-06-25 NOTE — Progress Notes (Signed)
Physical Therapy Treatment Patient Details Name: Melinda Mckinney MRN: [id number] DOB: [birth year] Today's Date: 06/25/2019    History of Present Illness 63 y.o. female with a history of atrial fibrillation on Xarelto, Covid 9/20, CAD s/p stent, hypertension, systolic heart failure. Patient presented secondary to abdominal pain and found to have diverticulitis, failing outpatient treatment. Also  with atrial fibrillation with RVR. Pt s/p cardiac cath 06/17/2019. Pt underwent cardioversion on 11/4 and 11/6. s/p pacemaker placement 11/09.    PT Comments    Patient seen for mobility progression. Pt cleared by RN to participate in therapy and pt eager to mobilize this am. Pt tolerated total gait distance of 120 ft with min guard assist. VSS. Pt with elevated RR and mild SOB with activity but reports feeling "much better". Pt will continue to benefit from further skilled PT services to maximize independence and safety with mobility.     Follow Up Recommendations  Home health PT;Supervision for mobility/OOB     Equipment Recommendations  None recommended by PT    Recommendations for Other Services OT consult     Precautions / Restrictions Precautions Precautions: Fall Precaution Comments: recent falls Restrictions Weight Bearing Restrictions: No    Mobility  Bed Mobility Overal bed mobility: Needs Assistance Bed Mobility: Rolling;Sidelying to Sit Rolling: Supervision Sidelying to sit: Supervision       General bed mobility comments: supervision for safety; no physical assist or use of rail needed; pt going toward R side  Transfers Overall transfer level: Needs assistance Equipment used: None Transfers: Sit to/from Stand;Stand Pivot Transfers Sit to Stand: Supervision         General transfer comment: supervision for safety  Ambulation/Gait Ambulation/Gait assistance: Min guard Gait Distance (Feet): 120 Feet Assistive device: None Gait Pattern/deviations: Step-through  pattern;Decreased step length - left;Decreased step length - right Gait velocity: decreased   General Gait Details: pt with decreased cadence and stride length; mild unsteadiness noted; no LOB    Stairs             Wheelchair Mobility    Modified Rankin (Stroke Patients Only)       Balance Overall balance assessment: Needs assistance Sitting-balance support: Feet supported Sitting balance-Leahy Scale: Good     Standing balance support: No upper extremity supported;During functional activity Standing balance-Leahy Scale: Fair                              Cognition Arousal/Alertness: Awake/alert Behavior During Therapy: WFL for tasks assessed/performed Overall Cognitive Status: Within Functional Limits for tasks assessed                                        Exercises      General Comments General comments (skin integrity, edema, etc.): HR <100 bpm with activity and SpO2 90% or > on RA; elevated RR and mild SOB noted while ambulating; pt reports feeling "much better" and very happy that she could ambulate this am      Pertinent Vitals/Pain Pain Assessment: Faces Faces Pain Scale: Hurts a little bit Pain Location: pacemaker insertion site Pain Descriptors / Indicators: Discomfort;Guarding Pain Intervention(s): Limited activity within patient's tolerance;Monitored during session;Repositioned    Home Living                      Prior Function  PT Goals (current goals can now be found in the care plan section) Progress towards PT goals: Progressing toward goals    Frequency    Min 3X/week      PT Plan Current plan remains appropriate    Co-evaluation              AM-PAC PT "6 Clicks" Mobility   Outcome Measure  Help needed turning from your back to your side while in a flat bed without using bedrails?: None Help needed moving from lying on your back to sitting on the side of a flat bed without  using bedrails?: None Help needed moving to and from a bed to a chair (including a wheelchair)?: None Help needed standing up from a chair using your arms (e.g., wheelchair or bedside chair)?: None Help needed to walk in hospital room?: A Little Help needed climbing 3-5 steps with a railing? : A Little 6 Click Score: 22    End of Session Equipment Utilized During Treatment: Gait belt Activity Tolerance: Patient tolerated treatment well Patient left: in chair;with call bell/phone within reach;with nursing/sitter in room Nurse Communication: Mobility status PT Visit Diagnosis: Difficulty in walking, not elsewhere classified (R26.2);Muscle weakness (generalized) (M62.81);History of falling (Z91.81)     Time: [id number] PT Time Calculation (min) (ACUTE ONLY): 19 min  Charges:  $Gait Training: 8-22 mins                     Earney Navy, PTA Acute Rehabilitation Services Pager: [phone] Office: [phone]     Darliss Cheney 06/25/2019, 9:15 AM

## 2019-06-25 NOTE — Progress Notes (Signed)
Patient ID: Melinda Mckinney, female   DOB: 05-10-1956, 63 y.o.   MRN: [id number] Patient ID: Melinda Mckinney, female   DOB: 10/16/1955, 63 y.o.   MRN: [id number]     Advanced Heart Failure Rounding Note  PCP-Cardiologist: Skeet Latch, MD   Subjective:    Echo 06/16/19 with severely reduced EF, 25-30% (ehcho 2018 EF 45-50%). LHC 11/2 w/ normal cors. RHC w/ high filling pressures and low CO.   Started on milrinone 11/2 for low CO. CI 1.6 on RHC. Initial Co-ox was 60%. Hospital course c/b afib w/ RVR. Milrinone discontinued 11/4. On digoxin. Co-ox good off of milrinone at 76.3%.   S/p TEE/DCCV 11/4. Cardioverted 4 times, using sternal pressure after the first attempt.   Cardioverted at [facility]. She remained in NSR until 11/5, then back to afib/RVR with HR 140s. Cardioverted again 11/6. Went back to AF overnight on 11/6  Remains in AF with RVR with rates in 130s. Mild dyspnea today.    Co-ox 62%, CVP 5 on my read.    - TEE: EF 15% (in setting of rapid afib), moderate LV dilation, moderately dilated/dysfunctional RV, moderate MR.  - Cardiac MRI: LV EF 34%, RV EF 30%, no LGE.    Objective:   Weight Range: 103.9 kg Body mass index is 38.12 kg/m.   Vital Signs:   Temp:  [97.7 F (36.5 C)-97.9 F (36.6 C)] 97.7 F (36.5 C) (11/10 0748) Pulse Rate:  [40-147] 88 (11/10 0400) Resp:  [5-35] 20 (11/10 0400) BP: (90-156)/(49-111) 109/98 (11/10 0400) SpO2:  [0 %-100 %] 93 % (11/10 0400) Weight:  [103.9 kg] 103.9 kg (11/10 0500) Last BM Date: 06/24/19  Weight change: Filed Weights   06/21/19 0500 06/22/19 0500 06/25/19 0500  Weight: 107.4 kg 105.1 kg 103.9 kg    Intake/Output:   Intake/Output Summary (Last 24 hours) at 06/25/2019 0853 Last data filed at 06/25/2019 0000 Gross per 24 hour  Intake 427.04 ml  Output -  Net 427.04 ml      Physical Exam   General: NAD Neck: No JVD, no thyromegaly or thyroid nodule.  Lungs: Clear to auscultation bilaterally with normal respiratory  effort. CV: Nondisplaced PMI.  Heart tachy, irregular S1/S2, no S3/S4, no murmur.  No peripheral edema.   Abdomen: Soft, nontender, no hepatosplenomegaly, no distention.  Skin: Intact without lesions or rashes.  Neurologic: Alert and oriented x 3.  Psych: Normal affect. Extremities: No clubbing or cyanosis.  HEENT: Normal.    Telemetry   Atrial fibrillation 130s. Personally reviewed  Labs    CBC Recent Labs    06/23/19 1145  WBC 7.5  HGB 12.9  HCT 42.1  MCV 88.4  PLT [id number]   Basic Metabolic Panel Recent Labs    06/24/19 0324 06/25/19 0523  NA 134* 137  K 3.6 4.3  CL 93* 98  CO2 27 27  GLUCOSE 137* 123*  BUN 7* 8  CREATININE 1.22* 1.13*  CALCIUM 9.1 9.3   Liver Function Tests No results for input(s): AST, ALT, ALKPHOS, BILITOT, PROT, ALBUMIN in the last 72 hours. No results for input(s): LIPASE, AMYLASE in the last 72 hours. Cardiac Enzymes No results for input(s): CKTOTAL, CKMB, CKMBINDEX, TROPONINI in the last 72 hours.  BNP: BNP (last 3 results) Recent Labs    05/26/19 0720 05/27/19 0245 05/28/19 0630  BNP 604.0* 330.7* 345.9*    ProBNP (last 3 results) No results for input(s): PROBNP in the last 8760 hours.   D-Dimer No results for input(s): DDIMER in the  last 72 hours. Hemoglobin A1C No results for input(s): HGBA1C in the last 72 hours. Fasting Lipid Panel No results for input(s): CHOL, HDL, LDLCALC, TRIG, CHOLHDL, LDLDIRECT in the last 72 hours. Thyroid Function Tests No results for input(s): TSH, T4TOTAL, T3FREE, THYROIDAB in the last 72 hours.  Invalid input(s): FREET3  Other results:   Imaging    Dg Chest 2 View  Result Date: 06/25/2019 CLINICAL DATA:  Patient status post pacemaker placement 06/24/2019. EXAM: CHEST - 2 VIEW COMPARISON:  Single-view of the chest 06/17/2019. FINDINGS: Three lead pacing device is in place with leads in the right atrium, right ventricle and exiting the coronary sinus. Right PICC is noted. Lungs are  clear. No pneumothorax or pleural fluid. No acute or focal bony abnormality. IMPRESSION: New pacemaker in place.  Negative for pneumothorax or acute disease. Electronically Signed   By: Inge Rise M.D.   On: 06/25/2019 06:40     Medications:     Scheduled Medications: . allopurinol  100 mg Oral QHS  . atorvastatin  40 mg Oral QHS  . Chlorhexidine Gluconate Cloth  6 each Topical Daily  . digoxin  0.125 mg Oral Daily  . DULoxetine  60 mg Oral Daily  . Gerhardt's butt cream   Topical QID  . insulin aspart  0-9 Units Subcutaneous Q4H  . levothyroxine  125 mcg Oral QAC breakfast  . magic mouthwash  15 mL Oral QID  . nystatin   Topical BID  . pantoprazole  40 mg Oral BID  . raloxifene  60 mg Oral QHS  . sacubitril-valsartan  1 tablet Oral BID  . sodium chloride flush  3 mL Intravenous Q12H  . sodium chloride flush  3 mL Intravenous Q12H  . spironolactone  25 mg Oral Daily  . torsemide  40 mg Oral Daily    Infusions: . sodium chloride    . sodium chloride      PRN Medications: sodium chloride, acetaminophen, cyclobenzaprine, HYDROmorphone (DILAUDID) injection, morphine injection, ondansetron (ZOFRAN) IV, ondansetron **OR** [DISCONTINUED] ondansetron (ZOFRAN) IV, sodium chloride flush, sodium chloride flush, sodium chloride flush, sodium chloride flush, temazepam    Patient Profile   63 y/o female w/ h/o CAD s/p PCI in 2010, chronic combined systolic and diastolic HF (EF [id number] in 2018), atrial fibrillation on chronic a/c w/ Xarelto, HTN, prediabetes, hypothyroidism and diagnosed w/ COVID 19 in Sep 2020 and was treated at [place] infection c/b PNA and gastroenteritis. Admitted to [facility] 10/27 for diverticulitis and afib w/ RVR and found to have worsening systolic function w/ EF now at 25-30%. Subsequent LHC showed widely patent coronaries. RHC c/w severely elevated filling pressures and low CO.   Assessment/Plan   1. Acute systolic CHF:  Nonischemic  cardiomyopathy.  Echo in 2018 with EF 45-50%.  Echo this admission with EF 25-30%, diffuse hypokinesis, mildly decreased RV systolic function, moderate MR. LHC/RHC this admission with no significant coronary disease, markedly elevated filling pressures and low cardiac output (CI 1.6).  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 Cardiac MRI 11/5 showed LVEF 34%, RVEF 30%, no LGE => perhaps pointing to tachy-mediated CMP as more likely etiology.  Initially required inotropic support w/ milrinone but discontinued 11/4.  Now s/p AV nodal ablation with MDT CRT-P.  CVP around 8 today.  She is not volume overloaded on exam.  Creatinine 1.13.  - Continue torsemide 40 daily - Transition to Entresto 24/26 bid.    -  Continue spironolactone to 25 daily.  - Continue digoxin 0.125 daily, level ok  - She is off amiodarone, can start low dose Coreg 3.125 mg bid.  2. Atrial fibrillation with RVR: Persistent atrial fibrillation, it appears, at least since 9/20 when she was admitted with COVID-19. Prior, she had paroxysmal atrial fibrillation and was maintained on sotalol. She is now off sotalol.  Afib has been difficult to rate control, initially had DCCV 11/4 back to NSR.  Went back to afib/RVR in 140s on 11/5. Recardioverted on 11/6. Went back into AF with RVR again. Unable to get rate down despite amiodarone gtt and boluses.  Therefore, she had AV nodal ablation with BiV pacing.  - Now off amiodarone.   - Restart Xarelto 20 mg daily on Saturday per EP.  - given body habitus, needs outpatient sleep study if no previous evaluation  3. Mitral regurgitation: Functional MR, moderate on TEE.  In NSR on cardiac MRI, MR was less impressive. Can re-assess down the road.  4. Diverticulitis: Afebrile, WBCs not elevated. Cefdinir and Flagyl course has been completed.  5. Hypothyroidism: on levothyroxine.  6. Hypokalemia: Resolved.   7.  Hyperglycemia/Prediabetes: CBGs improving. Hgb A1c 04/2019 was 6.1  -management per IM  8. Disposition: She should be ready for home soon.  Needs followup in device clinic and CHF clinic. Meds for home: Coreg 3.125 mg bid, Entresto 24/26 bid, torsemide 40 daily, spironolactone 25 daily, digoxin 0.125 daily, atorvastatin 40 daily, Xarelto 20 daily to start Saturday.    [name] MD 06/25/2019 8:53 AM

## 2019-06-25 NOTE — Progress Notes (Signed)
CARDIAC REHAB PHASE I   PRE:  Rate/Rhythm: 90 paced    BP: sitting 96/65    SaO2:   MODE:  Ambulation: 300 ft   POST:  Rate/Rhythm: 126 pacing    BP: sitting 108/71     SaO2: 94 RA  Pt fairly steady, moving well. Happy to be ambulating. HR increased to 120s. Resolved with rest. C/o incision plan. Will try to find Spanish educational materials. [place], ACSM 06/25/2019 2:58 PM

## 2019-06-25 NOTE — TOC Transition Note (Signed)
Transition of Care [facility]) - CM/SW Discharge Note   Patient Details  Name: Melinda Mckinney MRN: [id number] Date of Birth: 1956-06-05  [facility] CM/SW Contact:  Zenon Mayo, RN Phone Number: 06/25/2019, 11:15 AM   Clinical Narrative:    Patient is active with [facility] for HHPT, NCM offered choice, they chose [facility] for continued services.  Will add HHRN, HHOT.  Soc will begin 24 to 48 hrs post dc.     Final next level of care: [place] Barriers to Discharge: No Barriers Identified   Patient Goals and CMS Choice Patient states their goals for this hospitalization and ongoing recovery are:: relax CMS Medicare.gov Compare Post Acute Care list provided to:: Patient Choice offered to / list presented to : Patient, Spouse  Discharge Placement                       Discharge Plan and Services   Discharge Planning Services: CM Consult            DME Arranged: (NA)         HH Arranged: RN, PT, OT [place] Agency: [place] ([place]) Date [place]: 06/25/19 Time [place]: 1115 Representative spoke with at [place]: [place] ([place]) Interventions     Readmission Risk Interventions No flowsheet data found.

## 2019-06-25 NOTE — Progress Notes (Addendum)
Progress Note  Patient Name: Melinda Mckinney Date of Encounter: 06/25/2019  Primary Cardiologist: Skeet Latch, MD  Electrophysiologist: Dr. Rayann Heman  Subjective   Minimal site discomfort 2/10, no CP, no SOB  Inpatient Medications    Scheduled Meds: . allopurinol  100 mg Oral QHS  . atorvastatin  40 mg Oral QHS  . Chlorhexidine Gluconate Cloth  6 each Topical Daily  . digoxin  0.125 mg Oral Daily  . DULoxetine  60 mg Oral Daily  . Gerhardt's butt cream   Topical QID  . insulin aspart  0-9 Units Subcutaneous Q4H  . levothyroxine  125 mcg Oral QAC breakfast  . losartan  12.5 mg Oral BID  . magic mouthwash  15 mL Oral QID  . nystatin   Topical BID  . pantoprazole  40 mg Oral BID  . raloxifene  60 mg Oral QHS  . sodium chloride flush  3 mL Intravenous Q12H  . sodium chloride flush  3 mL Intravenous Q12H  . spironolactone  25 mg Oral Daily  . torsemide  40 mg Oral Daily   Continuous Infusions: . sodium chloride    . sodium chloride     PRN Meds: sodium chloride, acetaminophen, cyclobenzaprine, HYDROmorphone (DILAUDID) injection, morphine injection, ondansetron (ZOFRAN) IV, ondansetron **OR** [DISCONTINUED] ondansetron (ZOFRAN) IV, sodium chloride flush, sodium chloride flush, sodium chloride flush, sodium chloride flush, temazepam   Vital Signs    Vitals:   06/24/19 1924 06/25/19 0400 06/25/19 0500 06/25/19 0748  BP: 128/85 (!) 109/98    Pulse: 90 88    Resp: 18 20    Temp: 97.8 F (36.6 C) 97.8 F (36.6 C)  97.7 F (36.5 C)  TempSrc: Oral Oral  Oral  SpO2: 95% 93%    Weight:   103.9 kg   Height:        Intake/Output Summary (Last 24 hours) at 06/25/2019 0829 Last data filed at 06/25/2019 0000 Gross per 24 hour  Intake 427.04 ml  Output 200 ml  Net 227.04 ml   Last 3 Weights 06/25/2019 06/22/2019 06/21/2019  Weight (lbs) 229 lb 0.9 oz 231 lb 11.3 oz 236 lb 12.4 oz  Weight (kg) 103.9 kg 105.1 kg 107.4 kg      Telemetry    V paced - Personally Reviewed   ECG    V paced - Personally Reviewed  Physical Exam   GEN: No acute distress.   Neck: No JVD Cardiac: RRR (paced), no murmurs, rubs, or gallops.  Respiratory: CTA b/l. GI: Soft, nontender to light palp, non-distended  MS: No edema; No deformity. Neuro:  Nonfocal  Psych: Normal affect   PPM site: dry, no bleeding or hematoma, no ecchymosis  Labs    High Sensitivity Troponin:  No results for input(s): TROPONINIHS in the last 720 hours.    Chemistry Recent Labs  Lab 06/23/19 0605 06/24/19 0324 06/25/19 0523  NA 136 134* 137  K 3.7 3.6 4.3  CL 95* 93* 98  CO2 28 27 27   GLUCOSE 132* 137* 123*  BUN 6* 7* 8  CREATININE 0.95 1.22* 1.13*  CALCIUM 8.9 9.1 9.3  GFRNONAA >60 46* 51*  GFRAA >60 54* 59*  ANIONGAP 13 14 12      Hematology Recent Labs  Lab 06/21/19 0405 06/22/19 0353 06/23/19 1145  WBC 7.6 7.7 7.5  RBC 4.28 4.50 4.76  HGB 11.6* 12.0 12.9  HCT 38.9 40.2 42.1  MCV 90.9 89.3 88.4  MCH 27.1 26.7 27.1  MCHC 29.8* 29.9* 30.6  RDW  16.1* 16.1* 16.3*  PLT 364 354 470*    BNPNo results for input(s): BNP, PROBNP in the last 168 hours.   DDimer No results for input(s): DDIMER in the last 168 hours.   Radiology    Dg Chest 2 View Result Date: 06/25/2019 CLINICAL DATA:  Patient status post pacemaker placement 06/24/2019. EXAM: CHEST - 2 VIEW COMPARISON:  Single-view of the chest 06/17/2019. FINDINGS: Three lead pacing device is in place with leads in the right atrium, right ventricle and exiting the coronary sinus. Right PICC is noted. Lungs are clear. No pneumothorax or pleural fluid. No acute or focal bony abnormality. IMPRESSION: New pacemaker in place.  Negative for pneumothorax or acute disease. Electronically Signed   By: Inge Rise M.D.   On: 06/25/2019 06:40    Cardiac Studies    06/20/2019: c.MRI IMPRESSION: 1.  Mildly dilated LV with diffuse moderate hypokinesis, EF 34%. 2. Normal RV size with moderately decreased systolic function, EF  [id number]. 3. No myocardial LGE, so no definitive evidence for prior MI, infiltrative disease, or myocarditis.   06/17/2019; LHC IMPRESSION: Ms. Daves has clean coronary arteries and severely elevated LVEDP and filling pressures.  She has a nonischemic cardiomyopathy.  It is difficult to tell whether this is tachycardia mediated from her A. fib related to COVID-19 or another etiology.  She did receive 80 mg of IV Lasix in the Cath Lab and put out 600 cc of urine.  The right common femoral artery and veins were closed with [facility] devices successfully.  Patient left lab in stable condition.  11/12020: TTE IMPRESSIONS  1. Left ventricular ejection fraction, by visual estimation, is 25 to 30%. The left ventricle has severely decreased function. There is no left ventricular hypertrophy.  2. Left ventricular diastolic parameters are indeterminate.  3. The left ventricle demonstrates global hypokinesis.  4. Global right ventricle has mildly reduced systolic function.The right ventricular size is normal. No increase in right ventricular wall thickness.  5. Left atrial size was moderately dilated.  6. Right atrial size was mildly dilated.  7. The mitral valve is normal in structure. Moderate mitral valve regurgitation. No evidence of mitral stenosis.  8. The tricuspid valve is normal in structure. Tricuspid valve regurgitation moderate.  9. The aortic valve is normal in structure. Aortic valve regurgitation is mild. No evidence of aortic valve sclerosis or stenosis. 10. The pulmonic valve was normal in structure. Pulmonic valve regurgitation is mild. 11. Aortic dilatation noted. 12. There is mild dilatation of the ascending aorta measuring 39 mm. 13. Mildly elevated pulmonary artery systolic pressure. 14. The inferior vena cava is normal in size with greater than 50% respiratory variability, suggesting right atrial pressure of 3 mmHg.  Patient Profile     63 y.o. female w/PMHx of CAD (PCI 2010), chronic  CHF (combined), Paroxysmal AFib, HTN, hypothyroid, pre-DM, stage I cervical cancer. Of late unfortunately diagnosed w/ COVID 19 in Sep 2020 and was treated at [place] infection c/b PNAand gastroenteritis. Admitted to [facility] 10/27 for diverticulitis and afib w/ RVR and found to have worsening systolic function w/ EF now at 25-30%. Subsequent LHC showed widely patent coronaries. RHC c/w severely elevated filling pressures and low CO.  Prior, she had paroxysmal atrial fibrillation and was maintained on sotalol.  She is now off sotalol.  Afib has been difficult to rate control had DCCV 11/4 back to NSR.  Had ERAF in 140s on 11/5.  Recardioverted on 11/6. Again with ERAF,  RVR  EP was consulted yesterday, seen by Dr. Rayann Heman who knows her well. Recommended and pt agreeable to CRT-P and AV node ablation, planned for today  Assessment & Plan    1. Paroxysmal AFib 2. RVR         CHA2DS2Vasc is 5, on xarelto, appropriately dosed     She has had now more persistent AFib associated with acute illness in the last month or 2.  Rates uncontrolled DCCV x2 with ERAF and RVR this admission Now is s/p CRT-P and AV node ablation yesterday with Dr. Lovena Le  Site is stable, no bleeding or hematoma Device check this AM with intact function, she has an escape rate in the 30's this AM CXR without ptx, and in review with Dr. Lovena Le, stable lead position, LV lead may have come back slightly, though is in good position  The patient is seen using [employer] Spanish interpretor, [phone] Wound care and activity instructions were reviewed with the patient She reports her husband and son read English well and will be able to read written post-procedure instructions Routine post procedure EP follow up is in place  PICC out today (discussed with HF team)  PLEASE DO NOT RESUME [place]   3. Acute on chronic CHF     Worsened LVEF      C/w AHF team  4. COVID + 04/30/2019 and  05/24/2019, respiratory panel negative otherwise on 05/26/2019     Treated  9/15-9/17 after presenting with GI symptoms and fever, she was found to have COVID-19. She was managed with supportive care and subsequently discharged home on Decadron. For 6 days prior to this current hospital admission-patient developed nausea, vomiting, upper abdominal pain and exertional dyspnea. Was found to have A. fib with RVR, chest x-ray was positive for multifocal infiltrates-CT of the abdomen was negative for acute abnormalities-patient was started on Cardizem infusion-and subsequently  Did have elevated CRP for which she was on steroids and has been tapered off.   Was completely symptom-free on room air for 3 to 4 days, no diarrhea and no distress whatsoever, and discharged on her home medications       Discharged 05/28/2019 No ongoing symptoms   EP service will sign off, though remain available Please recall if needed  For questions or updates, please contact [place] Please consult [email or web address] for contact info under   Signed, [place], PA-C  06/25/2019, 8:29 AM    EP Attending  Patient seen and examined. Agree with the findings as noted above. The patient is s/p AV node ablation and biv PM insertion and feels much better. Her PPM interrogation under my supervision demonstrates normal device function. Her CXR looks good. She can be discharged home from EP perspsective. Please hold all anti-coagulation until Saturday.  Mikle [place].D.

## 2019-06-25 NOTE — Plan of Care (Signed)

## 2019-06-26 ENCOUNTER — Telehealth (HOSPITAL_COMMUNITY): Payer: Self-pay | Admitting: Pharmacist

## 2019-06-26 DIAGNOSIS — Z95810 Presence of automatic (implantable) cardiac defibrillator: Secondary | ICD-10-CM

## 2019-06-26 LAB — GLUCOSE, CAPILLARY
Glucose-Capillary: 154 mg/dL — ABNORMAL HIGH (ref 70–99)
Glucose-Capillary: 106 mg/dL — ABNORMAL HIGH (ref 70–99)
Glucose-Capillary: 127 mg/dL — ABNORMAL HIGH (ref 70–99)

## 2019-06-26 LAB — BASIC METABOLIC PANEL
Anion gap: 11 (ref 5–15)
BUN: 9 mg/dL (ref 8–23)
CO2: 28 mmol/L (ref 22–32)
Calcium: 9.3 mg/dL (ref 8.9–10.3)
Chloride: 95 mmol/L — ABNORMAL LOW (ref 98–111)
Creatinine, Ser: 1.37 mg/dL — ABNORMAL HIGH (ref 0.44–1.00)
GFR calc Af Amer: 47 mL/min — ABNORMAL LOW (ref >60)
GFR calc non Af Amer: 40 mL/min — ABNORMAL LOW (ref >60)
Glucose, Bld: 118 mg/dL — ABNORMAL HIGH (ref 70–99)
Potassium: 4.3 mmol/L (ref 3.5–5.1)
Sodium: 134 mmol/L — ABNORMAL LOW (ref 135–145)

## 2019-06-26 LAB — CBC
HCT: 39.2 % (ref 36.0–46.0)
Hemoglobin: 12 g/dL (ref 12.0–15.0)
MCH: 27.3 pg (ref 26.0–34.0)
MCHC: 30.6 g/dL (ref 30.0–36.0)
MCV: 89.1 fL (ref 80.0–100.0)
Platelets: 354 10*3/uL (ref 150–400)
RBC: 4.4 MIL/uL (ref 3.87–5.11)
RDW: 16.1 % — ABNORMAL HIGH (ref 11.5–15.5)
WBC: 7.3 10*3/uL (ref 4.0–10.5)
nRBC: 0 % (ref 0.0–0.2)

## 2019-06-26 MED ORDER — SACUBITRIL-VALSARTAN 24-26 MG PO TABS: 1.0000 | ORAL_TABLET | Freq: Two times a day (BID) | ORAL | 0 refills | Status: DC

## 2019-06-26 MED ORDER — DIGOXIN 125 MCG PO TABS: 0.1250 mg | ORAL_TABLET | Freq: Every day | ORAL | 0 refills | Status: DC

## 2019-06-26 MED ORDER — CARVEDILOL 3.125 MG PO TABS: 3.1250 mg | ORAL_TABLET | Freq: Two times a day (BID) | ORAL | 0 refills | Status: DC

## 2019-06-26 MED ORDER — TORSEMIDE 20 MG PO TABS: 40.0000 mg | ORAL_TABLET | Freq: Every day | ORAL | 0 refills | Status: DC

## 2019-06-26 MED ORDER — SPIRONOLACTONE 25 MG PO TABS: 25.0000 mg | ORAL_TABLET | Freq: Every day | ORAL | 0 refills | Status: DC

## 2019-06-26 MED FILL — SPIRONOLACTONE 25 MG TABLET: 25 | 30 days supply | Qty: 30 | Fill #0

## 2019-06-26 MED FILL — ENTRESTO 24 MG-26 MG TABLET: 24-26 | 30 days supply | Qty: 60 | Fill #0

## 2019-06-26 MED FILL — CARVEDILOL 3.125 MG TABLET: 3.125 | 30 days supply | Qty: 60 | Fill #0

## 2019-06-26 MED FILL — TORSEMIDE 20 MG TABLET: 20 | 15 days supply | Qty: 30 | Fill #0

## 2019-06-26 MED FILL — DIGOXIN 0.125 MG TABLET: 125 | 30 days supply | Qty: 30 | Fill #0

## 2019-06-26 NOTE — Discharge Summary (Addendum)
Melinda Mckinney [id number] DOB: 22-Jul-1956 DOA: 06/11/2019  PCP: Colon Branch, MD  Admit date: 06/11/2019 Discharge date: 06/26/2019  Admitted From:07/12/19 Disposition:  06/26/19  Recommendations for Outpatient Follow-up:  1. Follow up with PCP in 1 week 2. Please obtain BMP/CBC in one week 3. Please follow up on the following pending results:  Home Health: Yes   Discharge Condition:Stable CODE STATUS: Full code Diet recommendation: Heart Healthy / Carb Modified / Regular / Dysphagia  Brief/Interim Summary: Melinda Cruzis a 63 y.o.femalewith medical history significant ofDM2, A.Fib on xarelto, CAD s/p stent. Patient originally diagnosed with COVID19 on 9/15. Admitted with gastroenteritis due to COVID 10/9-10/13. Developed worsening abd pain later in the month, CT abd/pelvis on 10/22 showed new acute diverticulitis not present on CT 10/9. She was put on cipro/flagyl. She continued to have left lower quadrant abdominal pain so came back to the emergency room. In the emergency room, repeat CT scan of the abdomen pelvis showed improvement of diverticulitis. Blood pressure was soft. Lactate was 2.7. Started on antibiotics. Found to be on A. fib with RVR and treated with Cardizem drip. Echocardiogram showed severely reduced ejection fraction to 25%. Underwent cardiac catheterization with normal coronaries. Patient was started on milrinone, amiodarone and Lasix and admitted to progressive care unit.  She remained in persistent A. fib with RVR, underwent TEE and cardioversion with successful conversion to sinus rhythm and remained on amiodarone drip at that time with EF being 15% 08/18/2018.  On 06/21/2019 patient went back to rapid atrial fibrillation being on amiodarone drip and underwent another TEE cardioversion with success and again went into rapid atrial fibrillation overnight.  She underwent status post AV nodal ablation and ICD implant on 06/24/2019.  Cardiology recommended starting  her Xarelto on 06/29/2019.  She had no shortness of breath, palpitations, chest pain today and stable to be discharged home.  She was also managed by cardiology for cardiomyopathy.  Discharge Diagnoses:  Principal Problem:   Sepsis ([place]) Active Problems:   Essential hypertension   Coronary artery disease involving native coronary artery of native heart with angina pectoris (HCC)   Atrial fibrillation with RVR (HCC)   Acute on chronic systolic (congestive) heart failure ([place])   COVID-19   Diverticulitis    Discharge Instructions  Discharge Instructions    Amb Referral to Cardiac Rehabilitation   Complete by: As directed    To [place]   Diagnosis: Heart Failure (see criteria below if ordering Phase II)   Heart Failure Type: Chronic Systolic & Diastolic   After initial evaluation and assessments completed: Virtual Based Care may be provided alone or in conjunction with Phase 2 Cardiac Rehab based on patient barriers.: Yes   Call MD for:  difficulty breathing, headache or visual disturbances   Complete by: As directed    Call MD for:  severe uncontrolled pain   Complete by: As directed    Call MD for:  temperature >100.4   Complete by: As directed    Diet - low sodium heart healthy   Complete by: As directed    Discharge instructions   Complete by: As directed    Fu pcp in 1-3 days, need bmp done. F/u with cardiology, EP, and CHF clinic F/u with device clinic   Increase activity slowly   Complete by: As directed      Allergies as of 06/26/2019      Reactions   Penicillins Swelling, Rash   Has patient had a PCN reaction causing immediate rash, facial/tongue/throat  swelling, SOB or lightheadedness with hypotension: No Has patient had a PCN reaction causing severe rash involving mucus membranes or skin necrosis: No Has patient had a PCN reaction that required hospitalization: No Has patient had a PCN reaction occurring within the last 10 years: No If all of the above  answers are "NO", then may proceed with Cephalosporin use.      Medication List    STOP taking these medications   ciprofloxacin 500 MG tablet Commonly known as: CIPRO   diltiazem 120 MG tablet Commonly known as: CARDIZEM   furosemide 20 MG tablet Commonly known as: LASIX   lisinopril 10 MG tablet Commonly known as: ZESTRIL   metroNIDAZOLE 500 MG tablet Commonly known as: FLAGYL   PRESCRIPTION MEDICATION   SOTALOL AF 120 MG Tabs     TAKE these medications   allopurinol 100 MG tablet Commonly known as: ZYLOPRIM Take 0.5 tablets (50 mg total) by mouth daily. What changed:   how much to take  when to take this Notes to patient: 06/26/19 9 pm   atorvastatin 40 MG tablet Commonly known as: LIPITOR Take 1 tablet (40 mg total) by mouth at bedtime. Notes to patient: 06/26/19 Bedtime   carvedilol 3.125 MG tablet Commonly known as: COREG Take 1 tablet (3.125 mg total) by mouth 2 (two) times daily with a meal. Notes to patient: 06/26/19 5 pm   cyclobenzaprine 10 MG tablet Commonly known as: FLEXERIL Take 1 tablet (10 mg total) by mouth 2 (two) times daily as needed for muscle spasms.   digoxin 0.125 MG tablet Commonly known as: LANOXIN Take 1 tablet (0.125 mg total) by mouth daily. Start taking on: June 27, 2019 Notes to patient: 06/27/19   DULoxetine 60 MG capsule Commonly known as: CYMBALTA Take 1 capsule (60 mg total) by mouth daily. Notes to patient: 06/27/19   levothyroxine 125 MCG tablet Commonly known as: SYNTHROID TAKE ONE TABLET BY MOUTH DAILY BEFORE BREAKFAST What changed: See the new instructions. Notes to patient: 06/27/19    metFORMIN 500 MG tablet Commonly known as: GLUCOPHAGE Take 1 tablet (500 mg total) by mouth 2 (two) times daily with a meal. Notes to patient: 06/26/19 Evening   omeprazole 20 MG capsule Commonly known as: PRILOSEC Take 1 capsule (20 mg total) by mouth 2 (two) times daily before a meal. Notes to patient: 06/26/19 5  pm before meal   raloxifene 60 MG tablet Commonly known as: EVISTA Take 1 tablet (60 mg total) by mouth daily. What changed: when to take this Notes to patient: 06/26/19 9 pm   rivaroxaban 20 MG Tabs tablet Commonly known as: Xarelto Take 1 tablet (20 mg total) by mouth daily with supper. Notes to patient: 06/29/19 on Saturday   sacubitril-valsartan 24-26 MG Commonly known as: ENTRESTO Take 1 tablet by mouth 2 (two) times daily. Notes to patient: 06/26/19 9 pm   spironolactone 25 MG tablet Commonly known as: ALDACTONE Take 1 tablet (25 mg total) by mouth daily. Start taking on: June 27, 2019 Notes to patient: 06/27/19   torsemide 20 MG tablet Commonly known as: DEMADEX Take 2 tablets (40 mg total) by mouth daily. Start taking on: June 27, 2019 Notes to patient: 06/27/19   Vitamin D3 125 MCG (5000 UT) Caps Take 5,000 Units by mouth daily. Notes to patient: 06/27/19   zolpidem 5 MG tablet Commonly known as: AMBIEN Take 1 tablet (5 mg total) by mouth at bedtime as needed. for sleep What changed: reasons to take this  Follow-up Information    Erlene Quan, PA-C Follow up on 07/10/2019.   Specialties: Cardiology, Radiology Why: Please arrive 15 minutes early for your 9:30am cardiology follow-up appointment Contact information: [place] [address] [phone]        Sherran Needs, NP Follow up on 06/20/2019.   Specialties: Nurse Practitioner, Cardiology Why: Please arrive 15 minutes early for your 9am post-hospital Afib clinic appointment Contact information: [place] [address] [phone]        [facility] Follow up.   Specialty: Cardiology Why: 07/10/2019 @ 8:30AM, wound check visit Contact information: [address], [place]       Thompson Grayer, MD Follow up.   Specialty: Cardiology Why: 09/23/2019 @  10:30AM Contact information: [place] [address] [phone]        Shirley Friar, PA-C Follow up.   Specialty: Physician Assistant Why: 07/22/2019 @ 9:40AM, routine pacemaker programming Contact information: [address] [place] [address] [place] Follow up.   Why: HHRN, HHPT, [facility] Follow up on 07/03/2019.   Specialty: Cardiology Why: at 11:20 [place] With Dr Lavell Islam information: [address] [id number] [place] [phone]         Allergies  Allergen Reactions  . Penicillins Swelling and Rash    Has patient had a PCN reaction causing immediate rash, facial/tongue/throat swelling, SOB or lightheadedness with hypotension: No Has patient had a PCN reaction causing severe rash involving mucus membranes or skin necrosis: No Has patient had a PCN reaction that required hospitalization: No Has patient had a PCN reaction occurring within the last 10 years: No If all of the above answers are "NO", then may proceed with Cephalosporin use.    Consultations: Heart failure cardiology, EP  Procedures/Studies: Dg Chest 2 View  Result Date: 06/25/2019 CLINICAL DATA:  Patient status post pacemaker placement 06/24/2019. EXAM: CHEST - 2 VIEW COMPARISON:  Single-view of the chest 06/17/2019. FINDINGS: Three lead pacing device is in place with leads in the right atrium, right ventricle and exiting the coronary sinus. Right PICC is noted. Lungs are clear. No pneumothorax or pleural fluid. No acute or focal bony abnormality. IMPRESSION: New pacemaker in place.  Negative for pneumothorax or acute disease. Electronically Signed   By: Inge Rise M.D.   On: 06/25/2019 06:40   Dg Chest 2 View  Result Date: 06/06/2019 CLINICAL DATA:  Follow-up pneumonia EXAM: CHEST - 2 VIEW  COMPARISON:  05/24/2019, 04/30/2019, 07/27/2017 FINDINGS: Enlarged cardiomediastinal silhouette. Diffuse interstitial prominence, felt at least partly related to chronic change. Mild residual patchy peripheral opacity in the right thorax and left base. Aortic atherosclerosis. No pneumothorax. No pleural effusion. IMPRESSION: 1. Cardiomegaly 2. Resolution of some of the previously noted bilateral airspace opacity with mild residual faint airspace disease at the periphery of the right lung and at the left base. Electronically Signed   By: Donavan Foil M.D.   On: 06/06/2019 17:51   Ct Abdomen Pelvis W Contrast  Result Date: 06/11/2019 CLINICAL DATA:  Left lower quadrant pain, bloating EXAM: CT ABDOMEN AND PELVIS WITH CONTRAST TECHNIQUE: Multidetector CT imaging of the abdomen and pelvis was performed using the standard protocol following bolus administration of intravenous  contrast. CONTRAST:  153mL OMNIPAQUE IOHEXOL 300 MG/ML  SOLN COMPARISON:  06/06/2019 FINDINGS: Lower chest: Mild peripheral airspace opacities noted in the lower lungs. No visible effusions. Heart is normal size. Hepatobiliary: Diffuse low-density throughout the liver compatible with fatty infiltration. No focal abnormality. Gallbladder unremarkable. Pancreas: No focal abnormality or ductal dilatation. Spleen: No focal abnormality.  Normal size. Adrenals/Urinary Tract: Prior right nephrectomy. Left kidney unremarkable. No hydronephrosis. Adrenal glands and urinary bladder unremarkable. Stomach/Bowel: Sigmoid and descending colonic diverticulosis. Mild inflammatory stranding adjacent to the proximal sigmoid colon which has decreased since prior study and compatible with improving diverticulitis. Small bowel decompressed as is the stomach. No bowel obstruction. Vascular/Lymphatic: No evidence of aneurysm or adenopathy. Reproductive: Uterus and adnexa unremarkable.  No mass. Other: No free fluid or free air. Musculoskeletal: No acute bony  abnormality. IMPRESSION: Improving but not completely resolved diverticulitis changes in the proximal sigmoid colon. Fatty infiltration of the liver. Mild peripheral opacities in the visualized lower lungs, similar to prior study. No effusions. Electronically Signed   By: Rolm Baptise M.D.   On: 06/11/2019 11:06   Ct Abdomen Pelvis W Contrast  Result Date: 06/06/2019 CLINICAL DATA:  Left lower quadrant pain history of nephrectomy EXAM: CT ABDOMEN AND PELVIS WITH CONTRAST TECHNIQUE: Multidetector CT imaging of the abdomen and pelvis was performed using the standard protocol following bolus administration of intravenous contrast. CONTRAST:  45mL OMNIPAQUE IOHEXOL 300 MG/ML  SOLN COMPARISON:  CT 05/24/2019 FINDINGS: Lower chest: Lung bases demonstrate resolution of previously noted pleural effusions. Largely resolved multifocal airspace disease at the bases with minimal ground-glass density at the right middle lobe and right base. Cardiomegaly. Coronary vascular calcification. Hepatobiliary: Hepatic steatosis. No calcified gallstone or biliary dilatation Pancreas: Unremarkable. No pancreatic ductal dilatation or surrounding inflammatory changes. Spleen: Normal in size without focal abnormality.  Granuloma Adrenals/Urinary Tract: Adrenal glands are normal. Status post right nephrectomy. Left kidney shows no hydronephrosis. The bladder is normal. Stomach/Bowel: Stomach nonenlarged. No dilated small bowel. Negative appendix. Prominent submucosal fat deposition in the right colon to the hepatic flexure suggesting chronic inflammatory process. Mild wall thickening and surrounding inflammatory change at the distal descending/proximal sigmoid colon consistent with diverticulitis. No extraluminal gas Vascular/Lymphatic: Nonaneurysmal aorta. Mild atherosclerosis. No increasing adenopathy Reproductive: Uterus and bilateral adnexa are unremarkable. Other: No free air.  Right lower quadrant fat containing hernia.  Musculoskeletal: No acute or suspicious osseous abnormality. IMPRESSION: 1. Findings consistent with acute non perforated diverticulitis involving the distal descending/proximal sigmoid colon. 2. Resolution of small pleural effusions. Significant clearing of previously noted bilateral lower lung airspace disease with minimal residual on the right 3. Status post right nephrectomy 4. Right lower quadrant fat containing hernia 5. Hepatic steatosis Electronically Signed   By: Donavan Foil M.D.   On: 06/06/2019 18:09   Dg Chest Port 1 View  Result Date: 06/17/2019 CLINICAL DATA:  PICC line placement EXAM: PORTABLE CHEST 1 VIEW COMPARISON:  06/11/2019 FINDINGS: Right PICC line tip is in the SVC. Cardiomegaly. Vascular congestion. Stable chronic interstitial prominence throughout the lungs. No effusions or acute bony abnormality. IMPRESSION: Cardiomegaly with vascular congestion. Stable chronic interstitial prominence. Electronically Signed   By: Rolm Baptise M.D.   On: 06/17/2019 21:50   Dg Chest Port 1 View  Result Date: 06/11/2019 CLINICAL DATA:  Positive COVID-19 test.  Abdominal pain. EXAM: PORTABLE CHEST 1 VIEW COMPARISON:  06/06/2019. FINDINGS: Stable cardiomegaly with normal pulmonary vascularity. Chronic bilateral pulmonary interstitial prominence noted. Findings consistent chronic interstitial lung disease. No acute abnormality identified.  No prominent pleural effusion. No pneumothorax. IMPRESSION: 1.  Stable cardiomegaly. 2. Chronic bilateral pulmonary interstitial prominence consistent chronic interstitial lung disease again noted. No acute abnormality identified. Electronically Signed   By: Marcello Moores  Register   On: 06/11/2019 12:03   Mr Cardiac Morphology W Wo Contrast  Result Date: 06/20/2019 CLINICAL DATA:  Nonischemic cardiomyopathy. EXAM: CARDIAC MRI TECHNIQUE: The patient was scanned on a 1.5 Tesla GE magnet. A dedicated cardiac coil was used. Functional imaging was done using Fiesta  sequences. 2,3, and 4 chamber views were done to assess for RWMA's. Modified Simpson's rule using a short axis stack was used to calculate an ejection fraction on a dedicated work Conservation officer, nature. The patient received 8 cc of Gadavist. After 10 minutes inversion recovery sequences were used to assess for infiltration and scar tissue. FINDINGS: Limited images of the lung fields showed no gross abnormalities. Trivial pericardial effusion. Mildly dilated left ventricle with normal wall thickness. Diffuse moderate hypokinesis, EF 34%. Normal right ventricular size with moderately decreased systolic function, EF [id number]. Moderate left atrial enlargement, mild right atrial enlargement. Trileaflet aortic valve with no stenosis, mild regurgitation. Mild-moderate mitral regurgitation visually. On delayed enhancement imaging, there was no myocardial late gadolinium enhancement (LGE) noted. Measurements: LVEDV 172 mL LVSV 59 mL LVEF 34% RVEDV 113 mL RVSV 33 mL RVEF 30% IMPRESSION: 1.  Mildly dilated LV with diffuse moderate hypokinesis, EF 34%. 2. Normal RV size with moderately decreased systolic function, EF [id number]. 3. No myocardial LGE, so no definitive evidence for prior MI, infiltrative disease, or myocarditis. Dalton Mclean Electronically Signed   By: [name] M.D.   On: 06/20/2019 13:07   [place] Ekg Site Rite  Result Date: 06/17/2019 If Site Rite image not attached, placement could not be confirmed due to current cardiac rhythm.  [place] Ekg Site Rite  Result Date: 06/17/2019 If Site Rite image not attached, placement could not be confirmed due to current cardiac rhythm.        Discharge Exam: Vitals:   06/26/19 1213 06/26/19 1224  BP: 122/81   Pulse:  89  Resp: 18 17  Temp: (!) 97.4 F (36.3 C)   SpO2: 95% 100%   Vitals:   06/26/19 0613 06/26/19 0716 06/26/19 1213 06/26/19 1224  BP: 108/88 103/70 122/81   Pulse: 89 88  89  Resp:  14 18 17   Temp:  97.7 F (36.5 C) (!) 97.4 F (36.3 C)    TempSrc:  Oral Oral   SpO2:  100% 95% 100%  Weight:      Height:        General: Pt is alert, awake, not in acute distress Cardiovascular: RRR, S1/S2 +, no rubs, no gallops.  Left chest wall with ICD no discharge.  Respiratory: CTA bilaterally, no wheezing, no rhonchi Abdominal: Soft, NT, ND, bowel sounds + Extremities: no edema, no cyanosis    The results of significant diagnostics from this hospitalization (including imaging, microbiology, ancillary and laboratory) are listed below for reference.     Microbiology: Recent Results (from the past 240 hour(s))  Surgical PCR screen     Status: None   Collection Time: 06/23/19 11:31 AM   Specimen: Nasal Mucosa; Nasal Swab  Result Value Ref Range Status   MRSA, PCR NEGATIVE NEGATIVE Final   Staphylococcus aureus NEGATIVE NEGATIVE Final    Comment: (NOTE) The Xpert SA Assay (FDA approved for NASAL specimens in patients 10 years of age and older), is one component of a comprehensive surveillance program.  It is not intended to diagnose infection nor to guide or monitor treatment. Performed at [facility], [place] [address], [place], [place] [address]      Labs: BNP (last 3 results) Recent Labs    05/26/19 0720 05/27/19 0245 05/28/19 0630  BNP 604.0* 330.7* [id number]*   Basic Metabolic Panel: Recent Labs  Lab 06/20/19 0445 06/21/19 0405 06/22/19 0353 06/23/19 0605 06/24/19 0324 06/25/19 0523 06/26/19 0111  NA 136 134* 135 136 134* 137 134*  K 3.9 3.7 3.0* 3.7 3.6 4.3 4.3  CL 97* 96* 102 95* 93* 98 95*  CO2 27 26 23 28 27 27 28   GLUCOSE 177* 204* 158* 132* 137* 123* 118*  BUN 7* <5* <5* 6* 7* 8 9  CREATININE 0.78 0.77 0.71 0.95 1.22* 1.13* 1.37*  CALCIUM 8.5* 8.6* 6.9* 8.9 9.1 9.3 9.3  MG 1.9 1.9 3.2*  --   --   --   --    Liver Function Tests: No results for input(s): AST, ALT, ALKPHOS, BILITOT, PROT, ALBUMIN in the last 168 hours. No results for input(s): LIPASE, AMYLASE in the last 168 hours. No results  for input(s): AMMONIA in the last 168 hours. CBC: Recent Labs  Lab 06/20/19 0445 06/21/19 0405 06/22/19 0353 06/23/19 1145 06/26/19 0111  WBC 6.4 7.6 7.7 7.5 7.3  HGB 10.8* 11.6* 12.0 12.9 12.0  HCT 35.6* 38.9 40.2 42.1 39.2  MCV 90.1 90.9 89.3 88.4 89.1  PLT 285 364 354 470* 354   Cardiac Enzymes: No results for input(s): CKTOTAL, CKMB, CKMBINDEX, TROPONINI in the last 168 hours. BNP: Invalid input(s): POCBNP CBG: Recent Labs  Lab 06/25/19 1944 06/25/19 2355 06/26/19 0402 06/26/19 0721 06/26/19 1154  GLUCAP 110* 118* 154* 106* 127*   D-Dimer No results for input(s): DDIMER in the last 72 hours. Hgb A1c No results for input(s): HGBA1C in the last 72 hours. Lipid Profile No results for input(s): CHOL, HDL, LDLCALC, TRIG, CHOLHDL, LDLDIRECT in the last 72 hours. Thyroid function studies No results for input(s): TSH, T4TOTAL, T3FREE, THYROIDAB in the last 72 hours.  Invalid input(s): FREET3 Anemia work up No results for input(s): VITAMINB12, FOLATE, FERRITIN, TIBC, IRON, RETICCTPCT in the last 72 hours. Urinalysis    Component Value Date/Time   COLORURINE AMBER (A) 06/11/2019 1526   APPEARANCEUR CLEAR 06/11/2019 1526   LABSPEC >1.046 (H) 06/11/2019 1526   PHURINE 6.0 06/11/2019 1526   GLUCOSEU NEGATIVE 06/11/2019 1526   GLUCOSEU NEGATIVE 06/06/2019 1538   HGBUR SMALL (A) 06/11/2019 1526   BILIRUBINUR NEGATIVE 06/11/2019 [place] 06/11/2019 1526   PROTEINUR 30 (A) 06/11/2019 1526   UROBILINOGEN 0.2 06/06/2019 1538   NITRITE NEGATIVE 06/11/2019 1526   LEUKOCYTESUR TRACE (A) 06/11/2019 1526   Sepsis Labs Invalid input(s): PROCALCITONIN,  WBC,  LACTICIDVEN Microbiology Recent Results (from the past 240 hour(s))  Surgical PCR screen     Status: None   Collection Time: 06/23/19 11:31 AM   Specimen: Nasal Mucosa; Nasal Swab  Result Value Ref Range Status   MRSA, PCR NEGATIVE NEGATIVE Final   Staphylococcus aureus NEGATIVE NEGATIVE Final     Comment: (NOTE) The Xpert SA Assay (FDA approved for NASAL specimens in patients 63 years of age and older), is one component of a comprehensive surveillance program. It is not intended to diagnose infection nor to guide or monitor treatment. Performed at [facility], [place] [address], [place], [place] [address]      Time coordinating discharge: Over 30 minutes  SIGNED:   Nolberto Hanlon, MD  [facility] 06/26/2019, 2:37 PM Pager   If 7PM-7AM, please contact night-coverage [email or web address] Password [email or web address]

## 2019-06-26 NOTE — Progress Notes (Signed)
There was no po pain medications, patient only received IV morphine or dilaudid. Talked patient's son Pilar Plate regarding pain medications. Patient was okay to take tylenol for pain. Explained him limitation of medication was total 4000 mg/day Removed PIV access, patient couldn't understand English. Pt's husband received discharge instructions and understood it well.  HS [employer]

## 2019-06-26 NOTE — Progress Notes (Signed)
Pt just walked with RN. Tolerated well. Discussed HF management with pt through [facility] interpreter. Good reception. Gave her materials. Will refer to [facility].  Yves Dill CES, ACSM 11:35 AM 06/26/2019 [phone]

## 2019-06-26 NOTE — Progress Notes (Signed)
Physical Therapy Treatment Patient Details Name: Melinda Mckinney MRN: [id number] DOB: 10/03/1955 Today's Date: 06/26/2019    History of Present Illness 63 y.o. female with a history of atrial fibrillation on Xarelto, Covid 9/20, CAD s/p stent, hypertension, systolic heart failure, admitted 06/11/19 with abdominal pain and found to have diverticulitis, failing outpatient treatment. Also  with atrial fibrillation with RVR. Pt s/p cardiac cath 06/17/2019. S/p cardioversion on 11/4 and 11/6. S/p pacemaker placement 11/09.   PT Comments    Pt progressing well with mobility. Able to transfer and ambulate at supervision-level; declining use of DME for added stability. Pt performing ADL tasks with supervision due to fall risk. Planning to d/c home today; will have assist from spouse. Continue to recommend follow-up with HHPT services to maximize functional mobility and independence.   Follow Up Recommendations  Home health PT;Supervision for mobility/OOB     Equipment Recommendations  None recommended by PT    Recommendations for Other Services       Precautions / Restrictions Precautions Precautions: Fall;ICD/Pacemaker Precaution Comments: LUE sling for comfort s/p pacemaker    Mobility  Bed Mobility Overal bed mobility: Modified Independent Bed Mobility: Sit to Supine              Transfers Overall transfer level: Independent Equipment used: None   Sit to Stand: Supervision            Ambulation/Gait Ambulation/Gait assistance: Supervision Gait Distance (Feet): 200 Feet Assistive device: None Gait Pattern/deviations: Step-through pattern;Decreased stride length Gait velocity: Decreased Gait velocity interpretation: 1.31 - 2.62 ft/sec, indicative of limited community ambulator General Gait Details: Slow, mildly unsteady gait with supervision for safety; pt intermittently reaching to hand rail for support, declined use of DME for added stability   Stairs              Wheelchair Mobility    Modified Rankin (Stroke Patients Only)       Balance Overall balance assessment: Needs assistance Sitting-balance support: Feet supported Sitting balance-Leahy Scale: Good     Standing balance support: No upper extremity supported;During functional activity Standing balance-Leahy Scale: Fair                              Cognition Arousal/Alertness: Awake/alert Behavior During Therapy: WFL for tasks assessed/performed Overall Cognitive Status: Within Functional Limits for tasks assessed                                 General Comments: WFL via Spanish video interpreter      Exercises      General Comments General comments (skin integrity, edema, etc.): SpO2 >/93% on RA; declines SOB      Pertinent Vitals/Pain Pain Assessment: No/denies pain Pain Intervention(s): Premedicated before session    Home Living                      Prior Function            PT Goals (current goals can now be found in the care plan section) Progress towards PT goals: Progressing toward goals    Frequency    Min 3X/week      PT Plan Current plan remains appropriate    Co-evaluation              AM-PAC PT "6 Clicks" Mobility   Outcome Measure  Help needed turning from  your back to your side while in a flat bed without using bedrails?: None Help needed moving from lying on your back to sitting on the side of a flat bed without using bedrails?: None Help needed moving to and from a bed to a chair (including a wheelchair)?: None Help needed standing up from a chair using your arms (e.g., wheelchair or bedside chair)?: None Help needed to walk in hospital room?: A Little Help needed climbing 3-5 steps with a railing? : A Little 6 Click Score: 22    End of Session   Activity Tolerance: Patient tolerated treatment well Patient left: in bed;with call bell/phone within reach Nurse Communication: Mobility  status PT Visit Diagnosis: Difficulty in walking, not elsewhere classified (R26.2);Muscle weakness (generalized) (M62.81);History of falling (Z91.81)     Time: [id number] PT Time Calculation (min) (ACUTE ONLY): 14 min  Charges:  $Gait Training: 8-22 mins                    Mabeline Caras, PT, DPT Acute Rehabilitation Services  Pager [phone] Office [place] 06/26/2019, 2:13 PM

## 2019-06-26 NOTE — Progress Notes (Signed)
Pt ambulated without SOB and HR stayed 80's-90's. Pt was tolerate well. HS [employer]

## 2019-06-26 NOTE — Telephone Encounter (Signed)
Advanced Heart Failure Patient Advocate Encounter  Prior Authorization for Delene Loll has been approved.    Effective dates: 05/27/2019 through 06/25/2021  Patients co-pay is $265.64  Of note, patient also has Tricare. When processed through [employer] as primary insurance and Tricare as secondary insurance, the [place] copay is $0.00.  Audry Riles, [name], BCPS, CPP Heart Failure Clinic Pharmacist [phone]'

## 2019-06-26 NOTE — Progress Notes (Signed)
Patient ID: Melinda Mckinney, female   DOB: 09-03-1955, 63 y.o.   MRN: [id number]  Patient is clinically stable today.    See yesterday's note for cardiac meds for discharge.  We have arranged followup.   [name] 06/26/2019

## 2019-06-27 ENCOUNTER — Telehealth: Payer: Self-pay | Admitting: *Deleted

## 2019-06-27 NOTE — Telephone Encounter (Signed)
Unable to reach pt. Pt has hospital follow up scheduled 06/28/19.

## 2019-06-28 ENCOUNTER — Other Ambulatory Visit: Payer: Self-pay

## 2019-06-28 ENCOUNTER — Encounter: Payer: Self-pay | Admitting: Internal Medicine

## 2019-06-28 ENCOUNTER — Ambulatory Visit (INDEPENDENT_AMBULATORY_CARE_PROVIDER_SITE_OTHER): Payer: Medicare Other | Admitting: Internal Medicine

## 2019-06-28 VITALS — BP 96/49 | HR 89 | Temp 96.7°F | Resp 12 | Ht 64.0 in | Wt 225.6 lb

## 2019-06-28 DIAGNOSIS — I428 Other cardiomyopathies: Secondary | ICD-10-CM

## 2019-06-28 DIAGNOSIS — I10 Essential (primary) hypertension: Secondary | ICD-10-CM

## 2019-06-28 DIAGNOSIS — I4891 Unspecified atrial fibrillation: Secondary | ICD-10-CM | POA: Diagnosis not present

## 2019-06-28 LAB — CBC WITH DIFFERENTIAL/PLATELET
Basophils Absolute: 0.1 10*3/uL (ref 0.0–0.1)
Basophils Relative: 1.2 % (ref 0.0–3.0)
Eosinophils Absolute: 0.1 10*3/uL (ref 0.0–0.7)
Eosinophils Relative: 1.4 % (ref 0.0–5.0)
HCT: 41.2 % (ref 36.0–46.0)
Hemoglobin: 13.2 g/dL (ref 12.0–15.0)
Lymphocytes Relative: 20.6 % (ref 12.0–46.0)
Lymphs Abs: 2.2 10*3/uL (ref 0.7–4.0)
MCHC: 32 g/dL (ref 30.0–36.0)
MCV: 85.9 fl (ref 78.0–100.0)
Monocytes Absolute: 0.9 10*3/uL (ref 0.1–1.0)
Monocytes Relative: 8.4 % (ref 3.0–12.0)
Neutro Abs: 7.2 10*3/uL (ref 1.4–7.7)
Neutrophils Relative %: 68.4 % (ref 43.0–77.0)
Platelets: 410 10*3/uL — ABNORMAL HIGH (ref 150.0–400.0)
RBC: 4.79 Mil/uL (ref 3.87–5.11)
RDW: 17.6 % — ABNORMAL HIGH (ref 11.5–15.5)
WBC: 10.5 10*3/uL (ref 4.0–10.5)

## 2019-06-28 LAB — COMPREHENSIVE METABOLIC PANEL
ALT: 6 U/L (ref 0–35)
AST: 14 U/L (ref 0–37)
Albumin: 4.1 g/dL (ref 3.5–5.2)
Alkaline Phosphatase: 102 U/L (ref 39–117)
BUN: 18 mg/dL (ref 6–23)
CO2: 28 mEq/L (ref 19–32)
Calcium: 10.1 mg/dL (ref 8.4–10.5)
Chloride: 96 mEq/L (ref 96–112)
Creatinine, Ser: 1.31 mg/dL — ABNORMAL HIGH (ref 0.40–1.20)
GFR: 40.66 mL/min — ABNORMAL LOW (ref >60.00)
Glucose, Bld: 142 mg/dL — ABNORMAL HIGH (ref 70–99)
Potassium: 4.5 mEq/L (ref 3.5–5.1)
Sodium: 137 mEq/L (ref 135–145)
Total Bilirubin: 0.8 mg/dL (ref 0.2–1.2)
Total Protein: 7.3 g/dL (ref 6.0–8.3)

## 2019-06-28 NOTE — Progress Notes (Signed)
Subjective:    Patient ID: Melinda Mckinney, female    DOB: [birth year], 63 y.o.   MRN: [id number]  DOS:  06/28/2019 Type of visit - description: TCM 7 (TCM call attempted 06/27/2019)  After I saw her here the last time and dx w/ diverticulitis, she got worse and was admitted to the hospital from 06/11/2019 to 06/26/2019.  For diverticulitis she was continue with antibiotics and eventually improved  Was found to be on A. fib with RVR, echo showed ejection fraction of 25%, cardiac catheterization w/ normal coronaries. Drastic decrease on EF felt to be possibly induced by recent COVID-19 infection. As far as atrial fibrillation,  despite milrinone, amiodarone Lasix she did not improve from the cardiac standpoint.  Had a TEE, 2  cardioversion but again went to A. fib . Finally got AV nodal ablation with ICD implant.   Med reconciliation: Stopped lisinopril and Lasix New medicines: Carvedilol, digoxin, Entresto, spironolactone and torsemide   Review of Systems In general feels well. Shortness of breath significantly decreased, able to do more at home. No fever chills No substernal chest pain, does have pain at the site of the ICD implant. No edema, no palpitations. No nausea, vomiting, diarrhea.  No abdominal pain  Past Medical History:  Diagnosis Date  . Anemia   . Anxiety and depression   . Asthma   . Atrial fibrillation ([place]) 07/2017  . Borderline diabetes   . CAD (coronary artery disease) ~2009   stent   . Cervical cancer ([place])    "stage 1; had cryotherapy"  . Chronic lower back pain   . DDD (degenerative disc disease), lumbar    Dr. Nelva Bush, recommends lumbar epidural steroid injections L5-S1 to the right  . Depression   . Fibromyalgia   . GERD (gastroesophageal reflux disease)   . High cholesterol   . History of hiatal hernia   . History of kidney stones   . Hypertension   . Hypothyroidism   . Osteoporosis   . Thyroid disease     Past Surgical History:  Procedure  Laterality Date  . AV NODE ABLATION N/A 06/24/2019   Procedure: AV NODE ABLATION;  Surgeon: Evans Lance, MD;  Location: [facility];  Service: Cardiovascular;  Laterality: N/A;  . BIV PACEMAKER INSERTION CRT-P N/A 06/24/2019   Procedure: BIV PACEMAKER INSERTION CRT-P;  Surgeon: Evans Lance, MD;  Location: [facility];  Service: Cardiovascular;  Laterality: N/A;  . CARDIAC CATHETERIZATION  ~ 2008  . CARDIOVERSION N/A 08/05/2017   Procedure: CARDIOVERSION;  Surgeon: Evans Lance, MD;  Location: [facility];  Service: Cardiovascular;  Laterality: N/A;  . CARDIOVERSION N/A 08/24/2017   Procedure: CARDIOVERSION;  Surgeon: Sanda Klein, MD;  Location: [facility];  Service: Cardiovascular;  Laterality: N/A;  . CARDIOVERSION N/A 06/19/2019   Procedure: CARDIOVERSION;  Surgeon: Larey Dresser, MD;  Location: [facility];  Service: Cardiovascular;  Laterality: N/A;  . CARDIOVERSION N/A 06/21/2019   Procedure: CARDIOVERSION;  Surgeon: Larey Dresser, MD;  Location: [facility];  Service: Cardiovascular;  Laterality: N/A;  . [place]; ~ 1978/1979; 1984  . ESOPHAGOMYOTOMY  2009  . GYNECOLOGIC CRYOSURGERY  X 2   "stage 1 cancer"  . HERNIA REPAIR  X 6   "all in my stomach" (07/27/2017)  . KNEE ARTHROSCOPY Right   . NEPHRECTOMY Right 1991   in [place], due to fibrosis and lithiasis  . RIGHT/LEFT HEART CATH AND CORONARY ANGIOGRAPHY N/A 06/17/2019  Procedure: RIGHT/LEFT HEART CATH AND CORONARY ANGIOGRAPHY;  Surgeon: Lorretta Harp, MD;  Location: [facility];  Service: Cardiovascular;  Laterality: N/A;  . TEE WITHOUT CARDIOVERSION N/A 06/19/2019   Procedure: TRANSESOPHAGEAL ECHOCARDIOGRAM (TEE);  Surgeon: Larey Dresser, MD;  Location: [facility] OR;  Service: Cardiovascular;  Laterality: N/A;  . TUBAL LIGATION      Social History   Socioeconomic History  . Marital status: Married    Spouse name: Not on file  . Number of children: 3  . Years of education: Not  on file  . Highest education level: Not on file  Occupational History  . 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 Financial resource strain: Not on file  . Food insecurity    Worry: Not on file    Inability: Not on file  . Transportation needs    Medical: Not on file    Non-medical: Not on file  Tobacco Use  . Smoking status: Former Smoker    Packs/day: 2.00    Years: 28.00    Pack years: 56.00    Types: Cigarettes    Quit date: 2000    Years since quitting: 20.8  . Smokeless tobacco: Never Used  Substance and Sexual Activity  . Alcohol use: Yes    Comment: 07/27/2017 "3-4 drinks//month"  . Drug use: No  . Sexual activity: Not Currently    Comment: 1ST INTERCOURSE- 16, PARTNERS - 2  Lifestyle  . Physical activity    Days per week: Not on file    Minutes per session: Not on file  . Stress: Not on file  Relationships  . Social Herbalist on phone: Not on file    Gets together: Not on file    Attends religious service: Not on file    Active member of club or organization: Not on file    Attends meetings of clubs or organizations: Not on file    Relationship status: Not on file  . Intimate partner violence    Fear of current or ex partner: Not on file    Emotionally abused: Not on file    Physically abused: Not on file    Forced sexual activity: Not on file  Other Topics Concern  . Not on file  Social History Narrative   Original from [place]   3 children, 2 alive   Household --pt and husband       Allergies as of 06/28/2019      Reactions   Penicillins Swelling, Rash   Has patient had a PCN reaction causing immediate rash, facial/tongue/throat swelling, SOB or lightheadedness with hypotension: No Has patient had a PCN reaction causing severe rash involving mucus membranes or skin necrosis: No Has patient had a PCN reaction that required hospitalization: No Has patient had a PCN reaction occurring within the last 10 years: No If all of the above  answers are "NO", then may proceed with Cephalosporin use.      Medication List       Accurate as of June 28, 2019 10:30 AM. If you have any questions, ask your nurse or doctor.        allopurinol 100 MG tablet Commonly known as: ZYLOPRIM Take 0.5 tablets (50 mg total) by mouth daily. What changed:   how much to take  when to take this   atorvastatin 40 MG tablet Commonly known as: LIPITOR Take 1 tablet (40 mg total) by mouth at bedtime.   carvedilol  3.125 MG tablet Commonly known as: COREG Take 1 tablet (3.125 mg total) by mouth 2 (two) times daily with a meal.   cyclobenzaprine 10 MG tablet Commonly known as: FLEXERIL Take 1 tablet (10 mg total) by mouth 2 (two) times daily as needed for muscle spasms.   digoxin 0.125 MG tablet Commonly known as: LANOXIN Take 1 tablet (0.125 mg total) by mouth daily.   DULoxetine 60 MG capsule Commonly known as: CYMBALTA Take 1 capsule (60 mg total) by mouth daily.   levothyroxine 125 MCG tablet Commonly known as: SYNTHROID TAKE ONE TABLET BY MOUTH DAILY BEFORE BREAKFAST What changed: See the new instructions.   metFORMIN 500 MG tablet Commonly known as: GLUCOPHAGE Take 1 tablet (500 mg total) by mouth 2 (two) times daily with a meal.   omeprazole 20 MG capsule Commonly known as: PRILOSEC Take 1 capsule (20 mg total) by mouth 2 (two) times daily before a meal.   raloxifene 60 MG tablet Commonly known as: EVISTA Take 1 tablet (60 mg total) by mouth daily. What changed: when to take this   rivaroxaban 20 MG Tabs tablet Commonly known as: Xarelto Take 1 tablet (20 mg total) by mouth daily with supper.   sacubitril-valsartan 24-26 MG Commonly known as: ENTRESTO Take 1 tablet by mouth 2 (two) times daily.   spironolactone 25 MG tablet Commonly known as: ALDACTONE Take 1 tablet (25 mg total) by mouth daily.   torsemide 20 MG tablet Commonly known as: DEMADEX Take 2 tablets (40 mg total) by mouth daily.    Vitamin D3 125 MCG (5000 UT) Caps Take 5,000 Units by mouth daily.   zolpidem 5 MG tablet Commonly known as: AMBIEN Take 1 tablet (5 mg total) by mouth at bedtime as needed. for sleep What changed: reasons to take this           Objective:   Physical Exam BP (!) 96/49 (BP Location: Right Arm, Cuff Size: Large)   Pulse 89   Temp (!) 96.7 F (35.9 C) (Temporal)   Resp 12   Ht 5\' 4"  (1.626 m)   Wt 225 lb 9.6 oz (102.3 kg)   SpO2 100%   BMI 38.72 kg/m  General:   Well developed, NAD, BMI noted.  HEENT:  Normocephalic . Face symmetric, atraumatic Lungs:  CTA B Normal respiratory effort, no intercostal retractions, no accessory muscle use. Heart: RRR,  no murmur.  no pretibial edema bilaterally  Abdomen:  Not distended, soft, non-tender. No rebound or rigidity.   Skin: Not pale. Not jaundice Neurologic:  alert & oriented X3.  Speech normal, gait appropriate, needs some assistance transferring, she still  Feels very weak. Psych--  Cognition and judgment appear intact.  Cooperative with normal attention span and concentration.  Behavior appropriate. No anxious or depressed appearing.     Assessment     Assessment   Hyperglycemia HTN High cholesterol Hypothyroidism Anxiety depression, insomnia CV: --Atrial fibrillation DX 06-2017, s/p cardioversions, intolerant to metoprolol (?)  --06-2019 RVR, failed cardioversion, AV nodal ablation, ICD implanted --Known ischemic cardiomyopathy, DX 06-2019, Covid related? --CAD: stent ~ 2009 Morbid obesity: BMI 42 (2011) Vitamin D deficiency, saw Dr. Cruzita Lederer 07-2016, rtc 1 year, celiac dz test (-), unlikely  Malabsortion; cont supplements  GERD H/o achalasia, s/p [place] 2009 SINGLE KIDNEY---S/p R  Nephrectomy ([place] 1991 due to stones/fibrosis) MSK: --Fibromyalgia --DJD  Knee pain R --Back pain -- s/p epidural 10-16 Osteoporosis: Used to be managed by gynecology, now PCP. 08-2014 T score -2.8,  09-2015: T score  -2.7.- Was Rx Evista 09-2014  h/o cervical dysplasia Derm: vulvar pruritus, temovate prn ok per gyn  PLAN: Diverticulitis: Resolved Atrial fibrillation with RVR, nonischemic cardiomyopathy: Status post admission. failed cardioversion, s/p  AV nodal ablation, ICD implanted. Seems to be doing better Check CMP, CBC. BP is low see next HTN: BP today is low, she admits to some orthostatic symptoms, at the hospital the last BP readings  129/67, 111/70. Plan: Continue carvedilol, Entresto, Aldactone, decrease torsemide to 1 tablet daily for a couple of days, then go back to 2 tablets daily.  Monitor BPs and call me with results, see AVS. RTC 6 to 8 weeks

## 2019-06-28 NOTE — Patient Instructions (Addendum)
GO TO THE LAB : Get the blood work     GO TO THE FRONT DESK Schedule your next appointment   for a follow-up in 6 to 8 weeks  Keep all the appointment you have with the specialist  Your blood pressure is a little low today, for the next 2 days take only 1 torsemide (Demadex) tablet, then go back to 2 tablets a day  POR LOS SIGUIENTES 2 DIAS SOLO TOME UNA TORSEMIDE   Check the  blood pressure  daily BP GOAL is between 110/65 and  135/85. Call with your readings in 4 days

## 2019-06-29 NOTE — Assessment & Plan Note (Signed)
Diverticulitis: Resolved Atrial fibrillation with RVR, nonischemic cardiomyopathy: Status post admission. failed cardioversion, s/p  AV nodal ablation, ICD implanted. Seems to be doing better Check CMP, CBC. BP is low see next HTN: BP today is low, she admits to some orthostatic symptoms, at the hospital the last BP readings  129/67, 111/70. Plan: Continue carvedilol, Entresto, Aldactone, decrease torsemide to 1 tablet daily for a couple of days, then go back to 2 tablets daily.  Monitor BPs and call me with results, see AVS. RTC 6 to 8 weeks

## 2019-07-02 ENCOUNTER — Telehealth: Payer: Self-pay

## 2019-07-02 NOTE — Telephone Encounter (Signed)
Spoke w/ Lorna Few- verbal orders given.

## 2019-07-02 NOTE — Telephone Encounter (Signed)
Copied from [place] [id number]. Topic: General - Other >> Jul 02, 2019  9:37 AM Rainey Pines A wrote: Cecila from [place] is requesting verbal orders for PT 1w5 beginnning this week. Best contact [phone] mailbox is secure

## 2019-07-03 ENCOUNTER — Ambulatory Visit (HOSPITAL_COMMUNITY)
Admission: RE | Admit: 2019-07-03 | Discharge: 2019-07-03 | Disposition: A | Discharge status: Home / Self Care | Payer: Medicare Other | Source: Ambulatory Visit | Attending: Cardiology | Admitting: Cardiology

## 2019-07-03 ENCOUNTER — Other Ambulatory Visit: Payer: Self-pay

## 2019-07-03 VITALS — BP 120/82 | HR 88 | Wt 223.2 lb

## 2019-07-03 DIAGNOSIS — Z79899 Other long term (current) drug therapy: Secondary | ICD-10-CM | POA: Insufficient documentation

## 2019-07-03 DIAGNOSIS — Z87891 Personal history of nicotine dependence: Secondary | ICD-10-CM | POA: Insufficient documentation

## 2019-07-03 DIAGNOSIS — I340 Nonrheumatic mitral (valve) insufficiency: Secondary | ICD-10-CM | POA: Diagnosis not present

## 2019-07-03 DIAGNOSIS — Z7984 Long term (current) use of oral hypoglycemic drugs: Secondary | ICD-10-CM | POA: Diagnosis not present

## 2019-07-03 DIAGNOSIS — Z79890 Hormone replacement therapy (postmenopausal): Secondary | ICD-10-CM | POA: Insufficient documentation

## 2019-07-03 DIAGNOSIS — Z8249 Family history of ischemic heart disease and other diseases of the circulatory system: Secondary | ICD-10-CM | POA: Diagnosis not present

## 2019-07-03 DIAGNOSIS — Z905 Acquired absence of kidney: Secondary | ICD-10-CM | POA: Diagnosis not present

## 2019-07-03 DIAGNOSIS — N179 Acute kidney failure, unspecified: Secondary | ICD-10-CM | POA: Diagnosis not present

## 2019-07-03 DIAGNOSIS — I5022 Chronic systolic (congestive) heart failure: Secondary | ICD-10-CM | POA: Insufficient documentation

## 2019-07-03 DIAGNOSIS — K219 Gastro-esophageal reflux disease without esophagitis: Secondary | ICD-10-CM | POA: Insufficient documentation

## 2019-07-03 DIAGNOSIS — Z8619 Personal history of other infectious and parasitic diseases: Secondary | ICD-10-CM | POA: Diagnosis not present

## 2019-07-03 DIAGNOSIS — I2510 Atherosclerotic heart disease of native coronary artery without angina pectoris: Secondary | ICD-10-CM | POA: Insufficient documentation

## 2019-07-03 DIAGNOSIS — Z7901 Long term (current) use of anticoagulants: Secondary | ICD-10-CM | POA: Insufficient documentation

## 2019-07-03 DIAGNOSIS — Z8541 Personal history of malignant neoplasm of cervix uteri: Secondary | ICD-10-CM | POA: Diagnosis not present

## 2019-07-03 DIAGNOSIS — I110 Hypertensive heart disease with heart failure: Secondary | ICD-10-CM | POA: Diagnosis not present

## 2019-07-03 DIAGNOSIS — I428 Other cardiomyopathies: Secondary | ICD-10-CM | POA: Insufficient documentation

## 2019-07-03 DIAGNOSIS — M797 Fibromyalgia: Secondary | ICD-10-CM | POA: Insufficient documentation

## 2019-07-03 DIAGNOSIS — E039 Hypothyroidism, unspecified: Secondary | ICD-10-CM | POA: Diagnosis not present

## 2019-07-03 DIAGNOSIS — I4819 Other persistent atrial fibrillation: Secondary | ICD-10-CM

## 2019-07-03 LAB — BASIC METABOLIC PANEL
Anion gap: 17 — ABNORMAL HIGH (ref 5–15)
BUN: 23 mg/dL (ref 8–23)
CO2: 20 mmol/L — ABNORMAL LOW (ref 22–32)
Calcium: 9.7 mg/dL (ref 8.9–10.3)
Chloride: 99 mmol/L (ref 98–111)
Creatinine, Ser: 1.81 mg/dL — ABNORMAL HIGH (ref 0.44–1.00)
GFR calc Af Amer: 33 mL/min — ABNORMAL LOW (ref >60)
GFR calc non Af Amer: 29 mL/min — ABNORMAL LOW (ref >60)
Glucose, Bld: 141 mg/dL — ABNORMAL HIGH (ref 70–99)
Potassium: 4.8 mmol/L (ref 3.5–5.1)
Sodium: 136 mmol/L (ref 135–145)

## 2019-07-03 LAB — CBC
HCT: 45.6 % (ref 36.0–46.0)
Hemoglobin: 13.9 g/dL (ref 12.0–15.0)
MCH: 27.7 pg (ref 26.0–34.0)
MCHC: 30.5 g/dL (ref 30.0–36.0)
MCV: 90.8 fL (ref 80.0–100.0)
Platelets: 386 10*3/uL (ref 150–400)
RBC: 5.02 MIL/uL (ref 3.87–5.11)
RDW: 16.9 % — ABNORMAL HIGH (ref 11.5–15.5)
WBC: 9.4 10*3/uL (ref 4.0–10.5)
nRBC: 0 % (ref 0.0–0.2)

## 2019-07-03 LAB — DIGOXIN LEVEL: Digoxin Level: 1.4 ng/mL (ref 0.8–2.0)

## 2019-07-03 NOTE — Patient Instructions (Signed)
No medication changes today!  Make sure you are taking Xarelto every day!  EKG done in office today!  Labs today We will only contact you if something comes back abnormal or we need to make some changes. Otherwise no news is good news!   You have been referred to cardiac rehab.  You will get a call to schedule your appointment.    Your physician has requested that you have an echocardiogram in 3 months. Echocardiography is a painless test that uses sound waves to create images of your heart. It provides your doctor with information about the size and shape of your heart and how well your heart's chambers and valves are working. This procedure takes approximately one hour. There are no restrictions for this procedure.  Your physician recommends that you schedule a follow-up appointment in: 2 weeks with the pharmacist and 6 weeks with Dr Aundra Dubin.  At the [facility], you and your health needs are our priority. As part of our continuing mission to provide you with exceptional heart care, we have created designated Provider Care Teams. These Care Teams include your primary Cardiologist (physician) and Advanced Practice Providers (APPs- Physician Assistants and Nurse Practitioners) who all work together to provide you with the care you need, when you need it.   You may see any of the following providers on your designated Care Team at your next follow up: Marland Kitchen Dr Glori Bickers . Dr [name] . Darrick Grinder, NP . Lyda Jester, PA   Please be sure to bring in all your medications bottles to every appointment.

## 2019-07-03 NOTE — Progress Notes (Signed)
PCP: Colon Branch, MD  HF Cardiology: Dr. Aundra Dubin  63 y.o. with CAD s/p PCI in 2010, chronic combined systolic and diastolic HF (EF [id number] in 2018), atrial fibrillation on chronic anticoagulation w/ Xarelto, HTN, and hypothyroidism was diagnosed w/ COVID-19 in Sep 2020 and treated at [facility] c/b PNA. She was discharged and readmitted back to [facility] 10/9-10/13/20 for gastroenteritis, felt to be a sequela of COVID-19. She was managed w/ supportive care and discharged home. Had f/u with PCP 10/22 and endorsed worsening LLQ pain leading to abdominal CT which showed acute diverticulitis. She was started on outpatient antibiotics, cipro + flagyl but symptoms failed to improve, prompting her to seek emergency medical care at [facility].  Initial EKG in the ED showed afib w/ RVR for which general cardiology was consulted. She was started on IV dilt for rate control but later required treatment w/ IV amiodarone due to persistent rapid ventricular response. Hospital course c/b volume overload, requiring IV diuretics. 2D echo was obtained and showed worsening LVEF, now 25-30% w/ moderate MR. She underwent subsequent R/LHC in 11/20 which demonstrated widely patent coronaries. RHC showed elevated mean RA pressure at 22, PWP 43, LVEDP 45 and low CO. CI by Fick 1.6. She underwent TEE-guided DCCV, TEE showed EF 15% with moderate RV dysfunction and moderate MR.  She went back into rapid atrial fibrillation.  She failed additional cardioversions.  Despite amiodarone gtt, HR remained in the 140s-150s.  Finally, she underwent AV nodal ablation with BiV pacing due to inability to control atrial fibrillation. While in the hospital, she was on milrinone gtt for a period of time, but we were able to taper it off. She was discharged home after medication optimization.   She seems to be doing well at home.  She denies dyspnea, but she gets fatigued after walking about 100 yards.  She gets lightheaded  occasionally if she stands up too fast.  However, it passes quickly.  No falls.  No chest pain.  No BRPBR/melena.   Labs (11/20): digoxin level 1.4, creatinine 1.81, K 4.8, hgb 13.9  ECG: Atrial fibrillation with BiV pacing  PMH: 1. Hypothyroidism 2. Single kidney s/p nephrectomy 3. Atrial fibrillation: Initially diagnosed in 2018.  She was on sotalol at one point to maintain NSR.  - Uncontrollable atrial fibrillation during 11/20 admission, patient eventually had AV nodal ablation with CRT-P implantation.  4. H/o achalasia 5. GERD 6. Fibromyalgia 7. H/o diverticulitis in 10/20 8. COVID-19 in 9/20.  9. H/o cervical cancer: cryotherapy.  10. Degenerative disc disease.  11. Chronic systolic CHF: Nonischemic cardiomyopathy, possibly tachycardia-mediated in setting of uncontrolled atrial fibrillation. Med[employer]onic CRT-P device.  - Echo (2018): EF 45-50%.  - Echo (11/20): EF 25-30%, mildly decreased RV systolic function, moderate MR.  - TEE (11/20): EF 15%, moderately decreased RV systolic function, moderate MR.  - Cardiac MRI (11/20): EF 34%, RV EF 30%, no LGE (done after AV nodal ablation and BiV pacing).  - RHC (11/20): mean RA 22, mean PCWP 43, LVEDP 45, CI 1.6 (Fick) and 2.7 (thermodilution).  12. CAD: PCI in 2010.  - LHC (11/20): No significant coronary disease.   Social History   Socioeconomic History  . Marital status: Married    Spouse name: Not on file  . Number of children: 3  . Years of education: Not on file  . Highest education level: Not on file  Occupational History  . 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 Emergency planning/management officer  strain: Not on file  . Food insecurity    Worry: Not on file    Inability: Not on file  . Transportation needs    Medical: Not on file    Non-medical: Not on file  Tobacco Use  . Smoking status: Former Smoker    Packs/day: 2.00    Years: 28.00    Pack years: 56.00    Types: Cigarettes    Quit date: 2000    Years since quitting: 20.8   . Smokeless tobacco: Never Used  Substance and Sexual Activity  . Alcohol use: Yes    Comment: 07/27/2017 "3-4 drinks//month"  . Drug use: No  . Sexual activity: Not Currently    Comment: 1ST INTERCOURSE- 16, PARTNERS - 2  Lifestyle  . Physical activity    Days per week: Not on file    Minutes per session: Not on file  . Stress: Not on file  Relationships  . Social Herbalist on phone: Not on file    Gets together: Not on file    Attends religious service: Not on file    Active member of club or organization: Not on file    Attends meetings of clubs or organizations: Not on file    Relationship status: Not on file  . Intimate partner violence    Fear of current or ex partner: Not on file    Emotionally abused: Not on file    Physically abused: Not on file    Forced sexual activity: Not on file  Other Topics Concern  . Not on file  Social History Narrative   Original from [place]   3 children, 2 alive   Household --pt and husband    Family History  Problem Relation Age of Onset  . Breast cancer Other        aunt   . CAD Brother   . Lung cancer Mother        F and M  . Diabetes Father        F and mother   . CAD Father   . Lung cancer Father   . Stroke Sister   . Colon cancer Neg Hx    ROS: All systems reviewed and negative except as per HPI.   Current Outpatient Medications  Medication Sig Dispense Refill  . allopurinol (ZYLOPRIM) 100 MG tablet Take 0.5 tablets (50 mg total) by mouth daily. (Patient taking differently: Take 100 mg by mouth at bedtime. ) 45 tablet 1  . atorvastatin (LIPITOR) 40 MG tablet Take 1 tablet (40 mg total) by mouth at bedtime. 90 tablet 3  . carvedilol (COREG) 3.125 MG tablet Take 1 tablet (3.125 mg total) by mouth 2 (two) times daily with a meal. 60 tablet 0  . Cholecalciferol (VITAMIN D3) 5000 UNITS CAPS Take 5,000 Units by mouth daily.     . cyclobenzaprine (FLEXERIL) 10 MG tablet Take 1 tablet (10 mg total) by mouth 2  (two) times daily as needed for muscle spasms. 60 tablet 1  . digoxin (LANOXIN) 0.125 MG tablet Take 1 tablet (0.125 mg total) by mouth daily. 30 tablet 0  . DULoxetine (CYMBALTA) 60 MG capsule Take 1 capsule (60 mg total) by mouth daily. 90 capsule 1  . levothyroxine (SYNTHROID, LEVOTHROID) 125 MCG tablet TAKE ONE TABLET BY MOUTH DAILY BEFORE BREAKFAST (Patient taking differently: Take 125 mcg by mouth daily before breakfast. ) 90 tablet 3  . metFORMIN (GLUCOPHAGE) 500 MG tablet Take 1 tablet (500  mg total) by mouth 2 (two) times daily with a meal. 180 tablet 1  . omeprazole (PRILOSEC) 20 MG capsule Take 1 capsule (20 mg total) by mouth 2 (two) times daily before a meal. 180 capsule 3  . raloxifene (EVISTA) 60 MG tablet Take 1 tablet (60 mg total) by mouth daily. (Patient taking differently: Take 60 mg by mouth at bedtime. ) 30 tablet 11  . rivaroxaban (XARELTO) 20 MG TABS tablet Take 1 tablet (20 mg total) by mouth daily with supper. 90 tablet 3  . sacubitril-valsartan (ENTRESTO) 24-26 MG Take 1 tablet by mouth 2 (two) times daily. 60 tablet 0  . spironolactone (ALDACTONE) 25 MG tablet Take 1 tablet (25 mg total) by mouth daily. 30 tablet 0  . torsemide (DEMADEX) 20 MG tablet Take 2 tablets (40 mg total) by mouth daily. 30 tablet 0  . zolpidem (AMBIEN) 5 MG tablet Take 1 tablet (5 mg total) by mouth at bedtime as needed. for sleep (Patient taking differently: Take 5 mg by mouth at bedtime as needed for sleep. for sleep) 90 tablet 1   No current facility-administered medications for this encounter.    BP 120/82   Pulse 88   Wt 101.2 kg (223 lb 3.2 oz)   SpO2 98%   BMI 38.31 kg/m  General: NAD Neck: No JVD, no thyromegaly or thyroid nodule.  Lungs: Clear to auscultation bilaterally with normal respiratory effort. CV: Nondisplaced PMI.  Heart regular S1/S2, no S3/S4, no murmur.  No peripheral edema.  No carotid bruit.  Normal pedal pulses.  Abdomen: Soft, nontender, no hepatosplenomegaly, no  distention.  Skin: Intact without lesions or rashes.  Neurologic: Alert and oriented x 3.  Psych: Normal affect. Extremities: No clubbing or cyanosis.  HEENT: Normal.   Assessment/Plan: 1. Chronic systolic CHF: Nonischemic cardiomyopathy. Echo in 2018 with EF 45-50%. Echo in 11/20 with EF 25-30%, diffuse hypokinesis, mildly decreased RV systolic function, moderate MR. LHC/RHC in 11/20 with no significant coronary disease, markedly elevated filling pressures and low cardiac output (CI 1.6). 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 Cardiac MRI 11/20 showed LVEF 34%, RVEF 30%, no LGE => perhaps pointing to tachy-mediated CMP as more likely etiology.  Initially required inotropic support w/ milrinone but able to titrate off.  Now s/p AV nodal ablation with MDT CRT-P.  Doing well symptomatically since discharge with NYHA class II symptoms.  She is not volume overloaded on exam.  Creatinine elevation to 1.8 today suggests possible over-diuresis.  I will not titrate meds today with rise in creatinine and mild orthostatic symptoms.  - Hold torsemide x 2 days then decreased to 20 mg daily.  BMET again next week.  - Continue Entresto 24/26 bid.    - Continue spironolactone to 25 daily.  - Digoxin level elevated in setting of rise in creatinine, decrease digoxin to 0.0625 mg daily.  Repeat digoxin level with next labs.  - Continue Coreg 3.125 mg bid.  - Refer for cardiac rehab.  2. Atrial fibrillation: Persistent atrial fibrillation, it appears, at least since 9/20 when she was admitted with COVID-19. Prior, she had paroxysmal atrial fibrillation and was maintained on sotalol. She is now off sotalol.  Unable to cardiovert or rate control atrial fibrillation. Therefore, she had AV nodal ablation with BiV pacing.  - Now off amiodarone.   - Continue Xarelto 20 mg daily (may need to adjust dose if  creatinine does not improve with the above steps).   3. Mitral regurgitation: Functional MR, moderate on TEE.  Rate was controlled on cardiac MRI, MR was less impressive.  4. AKI: Creatinine up to 1.8 today.  Suspect overdiuresis.  Will cut back on torsemide as above.   Followup with HF pharmacist in 2 wks, see me again in 6 wks.   Melinda Mckinney 07/03/2019

## 2019-07-04 ENCOUNTER — Telehealth: Payer: Self-pay

## 2019-07-04 DIAGNOSIS — M810 Age-related osteoporosis without current pathological fracture: Secondary | ICD-10-CM

## 2019-07-04 DIAGNOSIS — D630 Anemia in neoplastic disease: Secondary | ICD-10-CM

## 2019-07-04 DIAGNOSIS — I4891 Unspecified atrial fibrillation: Secondary | ICD-10-CM

## 2019-07-04 DIAGNOSIS — Z7901 Long term (current) use of anticoagulants: Secondary | ICD-10-CM

## 2019-07-04 DIAGNOSIS — K219 Gastro-esophageal reflux disease without esophagitis: Secondary | ICD-10-CM

## 2019-07-04 DIAGNOSIS — Z8619 Personal history of other infectious and parasitic diseases: Secondary | ICD-10-CM | POA: Diagnosis not present

## 2019-07-04 DIAGNOSIS — I5032 Chronic diastolic (congestive) heart failure: Secondary | ICD-10-CM

## 2019-07-04 DIAGNOSIS — K5792 Diverticulitis of intestine, part unspecified, without perforation or abscess without bleeding: Secondary | ICD-10-CM | POA: Diagnosis not present

## 2019-07-04 DIAGNOSIS — E559 Vitamin D deficiency, unspecified: Secondary | ICD-10-CM

## 2019-07-04 DIAGNOSIS — U071 COVID-19: Secondary | ICD-10-CM | POA: Diagnosis not present

## 2019-07-04 DIAGNOSIS — Z7984 Long term (current) use of oral hypoglycemic drugs: Secondary | ICD-10-CM | POA: Diagnosis not present

## 2019-07-04 DIAGNOSIS — E876 Hypokalemia: Secondary | ICD-10-CM

## 2019-07-04 DIAGNOSIS — E039 Hypothyroidism, unspecified: Secondary | ICD-10-CM

## 2019-07-04 DIAGNOSIS — C539 Malignant neoplasm of cervix uteri, unspecified: Secondary | ICD-10-CM

## 2019-07-04 DIAGNOSIS — I5043 Acute on chronic combined systolic (congestive) and diastolic (congestive) heart failure: Secondary | ICD-10-CM | POA: Diagnosis not present

## 2019-07-04 DIAGNOSIS — Z950 Presence of cardiac pacemaker: Secondary | ICD-10-CM | POA: Diagnosis not present

## 2019-07-04 DIAGNOSIS — G8929 Other chronic pain: Secondary | ICD-10-CM

## 2019-07-04 DIAGNOSIS — J1289 Other viral pneumonia: Secondary | ICD-10-CM | POA: Diagnosis not present

## 2019-07-04 DIAGNOSIS — J45909 Unspecified asthma, uncomplicated: Secondary | ICD-10-CM

## 2019-07-04 DIAGNOSIS — M5136 Other intervertebral disc degeneration, lumbar region: Secondary | ICD-10-CM

## 2019-07-04 DIAGNOSIS — I25119 Atherosclerotic heart disease of native coronary artery with unspecified angina pectoris: Secondary | ICD-10-CM

## 2019-07-04 DIAGNOSIS — F329 Major depressive disorder, single episode, unspecified: Secondary | ICD-10-CM

## 2019-07-04 DIAGNOSIS — F419 Anxiety disorder, unspecified: Secondary | ICD-10-CM

## 2019-07-04 DIAGNOSIS — Z48812 Encounter for surgical aftercare following surgery on the circulatory system: Secondary | ICD-10-CM | POA: Diagnosis not present

## 2019-07-04 DIAGNOSIS — I110 Hypertensive heart disease with heart failure: Secondary | ICD-10-CM

## 2019-07-04 DIAGNOSIS — G4700 Insomnia, unspecified: Secondary | ICD-10-CM

## 2019-07-04 DIAGNOSIS — E7800 Pure hypercholesterolemia, unspecified: Secondary | ICD-10-CM

## 2019-07-04 DIAGNOSIS — M797 Fibromyalgia: Secondary | ICD-10-CM

## 2019-07-04 DIAGNOSIS — A0839 Other viral enteritis: Secondary | ICD-10-CM | POA: Diagnosis not present

## 2019-07-04 DIAGNOSIS — E669 Obesity, unspecified: Secondary | ICD-10-CM

## 2019-07-04 DIAGNOSIS — E119 Type 2 diabetes mellitus without complications: Secondary | ICD-10-CM | POA: Diagnosis not present

## 2019-07-04 NOTE — Telephone Encounter (Signed)
Plan of care received from [facility]- form signed and faxed back to [phone]. Form sent for scanning.

## 2019-07-08 ENCOUNTER — Telehealth (HOSPITAL_COMMUNITY): Payer: Self-pay

## 2019-07-08 NOTE — Telephone Encounter (Signed)
-----   Message from Larey Dresser, MD sent at 07/03/2019  5:55 PM EST ----- Hold digoxin x 1 day and decrease to 0.0625 daily.  Hold torsemide x 2 days and decrease to 20 mg daily.  BMET Monday.

## 2019-07-08 NOTE — Telephone Encounter (Signed)
Tried calling patient again (total 3 times) went straight to vm.  LM using spanish interpreter ID [id number]. Letter mailed

## 2019-07-09 ENCOUNTER — Ambulatory Visit (INDEPENDENT_AMBULATORY_CARE_PROVIDER_SITE_OTHER): Payer: Medicare Other | Admitting: *Deleted

## 2019-07-09 ENCOUNTER — Other Ambulatory Visit: Payer: Self-pay

## 2019-07-09 DIAGNOSIS — I255 Ischemic cardiomyopathy: Secondary | ICD-10-CM | POA: Diagnosis not present

## 2019-07-09 LAB — CUP PACEART INCLINIC DEVICE CHECK
Battery Remaining Longevity: 79 mo
Battery Voltage: 3.2 V
Brady Statistic AP VP Percent: 0 %
Brady Statistic AP VS Percent: 0 %
Brady Statistic AS VP Percent: 0 %
Brady Statistic AS VS Percent: 0 %
Brady Statistic RA Percent Paced: 0 %
Brady Statistic RV Percent Paced: 99.97 %
Date Time Interrogation Session: 20201124094800
Implantable Lead Implant Date: 20201109
Implantable Lead Implant Date: 20201109
Implantable Lead Implant Date: 20201109
Implantable Lead Location: 753858
Implantable Lead Location: 753859
Implantable Lead Location: 753860
Implantable Lead Model: 4598
Implantable Lead Model: 5076
Implantable Lead Model: 5076
Implantable Pulse Generator Implant Date: 20201109
Lead Channel Impedance Value: 1026 Ohm
Lead Channel Impedance Value: 1064 Ohm
Lead Channel Impedance Value: 1064 Ohm
Lead Channel Impedance Value: 1292 Ohm
Lead Channel Impedance Value: 1368 Ohm
Lead Channel Impedance Value: 361 Ohm
Lead Channel Impedance Value: 475 Ohm
Lead Channel Impedance Value: 551 Ohm
Lead Channel Impedance Value: 665 Ohm
Lead Channel Impedance Value: 722 Ohm
Lead Channel Impedance Value: 950 Ohm
Lead Channel Pacing Threshold Amplitude: 0.75 V
Lead Channel Pacing Threshold Pulse Width: 0.4 ms
Lead Channel Sensing Intrinsic Amplitude: 0.625 mV
Lead Channel Sensing Intrinsic Amplitude: 10.5 mV
Lead Channel Setting Pacing Amplitude: 3.5 V
Lead Channel Setting Pacing Amplitude: 3.5 V
Lead Channel Setting Pacing Pulse Width: 0.4 ms
Lead Channel Setting Pacing Pulse Width: 0.8 ms
Lead Channel Setting Sensing Sensitivity: 2 mV

## 2019-07-09 NOTE — Progress Notes (Signed)
CRT-P wound check and device check in clinic. Srei-strips removed , incision well healed with no edema, drainage or redness. Normal device function. Thresholds, sensing, impedance consistent with previous measurements. Outputs programmed at 3.5 V  for extra safety margin until 91 day folow-up.Histograms appropriate for patient and level of activity. 1 AT/AF burden 100% since implant , 1 ongoing episode controlled v-rates. + Xarelto. No ventricular high rate episodes. Patient bi-ventricularly pacing 99.95% of the time. Device programmed with appropriate safety margins. Device heart failure diagnostics are within normal limits and stable over time. Estimated longevity 6 yrs 7 months. Patient enrolled in remote follow-up and next remote scheduled for 09/24/19. Plan to check device remotely every 3 months . 3 month follow-up with Dr Lovena Le on 09/23/19.

## 2019-07-10 ENCOUNTER — Ambulatory Visit (INDEPENDENT_AMBULATORY_CARE_PROVIDER_SITE_OTHER): Payer: Medicare Other | Admitting: Cardiology

## 2019-07-10 ENCOUNTER — Telehealth: Payer: Self-pay

## 2019-07-10 ENCOUNTER — Encounter: Payer: Self-pay | Admitting: Cardiology

## 2019-07-10 DIAGNOSIS — I5021 Acute systolic (congestive) heart failure: Secondary | ICD-10-CM

## 2019-07-10 DIAGNOSIS — N289 Disorder of kidney and ureter, unspecified: Secondary | ICD-10-CM | POA: Diagnosis not present

## 2019-07-10 DIAGNOSIS — I5043 Acute on chronic combined systolic (congestive) and diastolic (congestive) heart failure: Secondary | ICD-10-CM | POA: Diagnosis not present

## 2019-07-10 DIAGNOSIS — K5792 Diverticulitis of intestine, part unspecified, without perforation or abscess without bleeding: Secondary | ICD-10-CM | POA: Diagnosis not present

## 2019-07-10 DIAGNOSIS — I428 Other cardiomyopathies: Secondary | ICD-10-CM

## 2019-07-10 DIAGNOSIS — E119 Type 2 diabetes mellitus without complications: Secondary | ICD-10-CM | POA: Diagnosis not present

## 2019-07-10 DIAGNOSIS — U071 COVID-19: Secondary | ICD-10-CM

## 2019-07-10 DIAGNOSIS — I2510 Atherosclerotic heart disease of native coronary artery without angina pectoris: Secondary | ICD-10-CM | POA: Diagnosis not present

## 2019-07-10 DIAGNOSIS — I25119 Atherosclerotic heart disease of native coronary artery with unspecified angina pectoris: Secondary | ICD-10-CM | POA: Diagnosis not present

## 2019-07-10 DIAGNOSIS — Z95810 Presence of automatic (implantable) cardiac defibrillator: Secondary | ICD-10-CM | POA: Diagnosis not present

## 2019-07-10 DIAGNOSIS — Z9861 Coronary angioplasty status: Secondary | ICD-10-CM

## 2019-07-10 DIAGNOSIS — I4891 Unspecified atrial fibrillation: Secondary | ICD-10-CM

## 2019-07-10 DIAGNOSIS — J1289 Other viral pneumonia: Secondary | ICD-10-CM | POA: Diagnosis not present

## 2019-07-10 DIAGNOSIS — Z48812 Encounter for surgical aftercare following surgery on the circulatory system: Secondary | ICD-10-CM | POA: Diagnosis not present

## 2019-07-10 DIAGNOSIS — I110 Hypertensive heart disease with heart failure: Secondary | ICD-10-CM | POA: Diagnosis not present

## 2019-07-10 DIAGNOSIS — J1282 Pneumonia due to coronavirus disease 2019: Secondary | ICD-10-CM

## 2019-07-10 DIAGNOSIS — I255 Ischemic cardiomyopathy: Secondary | ICD-10-CM

## 2019-07-10 MED ORDER — TORSEMIDE 20 MG PO TABS: 20.0000 mg | ORAL_TABLET | Freq: Every day | ORAL | 0 refills | Status: DC

## 2019-07-10 MED ORDER — SPIRONOLACTONE 25 MG PO TABS: 12.5000 mg | ORAL_TABLET | Freq: Every day | ORAL | 0 refills | Status: DC

## 2019-07-10 MED ORDER — DIGOXIN 125 MCG PO TABS: 0.0625 mg | ORAL_TABLET | Freq: Every day | ORAL | 0 refills | Status: DC

## 2019-07-10 NOTE — Assessment & Plan Note (Signed)
Pt failed DCCV and Amiodarone- s/p AVN ablation and pacemaker Nov 2020

## 2019-07-10 NOTE — Assessment & Plan Note (Signed)
Severe LVD- EF 15% by TEE 06/19/2019

## 2019-07-10 NOTE — Addendum Note (Signed)
Addended by: Ulice Brilliant T on: 07/10/2019 11:05 AM   Modules accepted: Orders

## 2019-07-10 NOTE — Assessment & Plan Note (Signed)
She seems compensated, possibly over diuresed based on her labs and B/P

## 2019-07-10 NOTE — Assessment & Plan Note (Signed)
This appears to have resolved

## 2019-07-10 NOTE — Telephone Encounter (Signed)
Interpreter ID: [id number]  Called interpreter to have her contact patient I need to update patients medication list as well as schedule a 3 month follow up with Dr Oval Linsey.   Interpreted advised to leave a message advising patient to contact office as well as be aware that a update medication list will be placed in the mail for her.

## 2019-07-10 NOTE — Assessment & Plan Note (Signed)
S/P AVN ablation for uncontrolled AF and Bi V ICD implant for NICM Nov 2020

## 2019-07-10 NOTE — Patient Instructions (Addendum)
Medication Instructions:  HOLD Digoxin for one day; if you have taken this medication today HOLD tomorrow then the next day take hald tablet once a day DECREASE Spironlactone to half tablet daily HOLD Torsemide for the next two days(if you took this medication today start tomorrow with holding this medication then take half tablet once a day  *If you need a refill on your cardiac medications before your next appointment, please call your pharmacy*  Instrucciones de medicacin: MANTENGA Digoxina durante un da; Si ha tomado este medicamento hoy Mingo y [place] un comprimido una [place] a la mitad de la tableta diaria [employer] la torsemida durante los prximos [employer] (si tom este medicamento hoy, comience maana manteniendo este medicamento y [employer] tome media tableta una vez al da * Si necesita reabastecer sus medicamentos para el corazn antes de su prxima cita, llame a su farmacia *  Lab Work: Your physician recommends that you return for lab work in: [employer] on July 17, 2019. (no appt is needed if you go to a lab corp or you can come here)  If you have labs (blood work) drawn today and your tests are completely normal, you will receive your results only by: Marland Kitchen MyChart Message (if you have MyChart) OR . A paper copy in the mail If you have any lab test that is abnormal or we need to change your treatment, we will call you to review the results.  Trabajo de laboratorio: Su mdico le recomienda que regrese para los anlisis de laboratorio en: BMET el 2 de Risk analyst de 2020 (no se necesita cita si va a un laboratorio o puede venir aqu)  Si le extraen [employer] de laboratorio (anlisis de [place]) hoy y sus pruebas son completamente normales, recibir sus resultados solo por: Mensaje de MyChart (si tiene MyChart) Merla Riches copia en papel por correo Si tiene alguna prueba de laboratorio que es anormal o si necesitamos cambiar su tratamiento,  lo llamaremos para revisar los resultados No necesita ayunar para su trabajo de laboratorio. Complete/hacer el trabajo de laboratorio antes de su prxima cita con Jonni Sanger  Testing/Procedures: None   Pruebas / Procedimientos: Ninguna  Follow-Up: At [employer] [facility], you and your health needs are our priority.  As part of our continuing mission to provide you with exceptional heart care, we have created designated Provider Care Teams.  These Care Teams include your primary Cardiologist (physician) and Advanced Practice Providers (APPs -  Physician Assistants and Nurse Practitioners) who all work together to provide you with the care you need, when you need it.  Seguimiento: En [employer], usted y sus necesidades de salud son Cleotis Nipper prioridad. Como parte de nuestra misin continua de brindarle una atencin cardaca excepcional, hemos creado Equipos designados para la atencin de proveedores. Estos equipos de atencin incluyen a su cardilogo (mdico) principal y proveedores de Financial planner (APP: asistentes mdicos y enfermeras practicantes) que trabajan juntos para brindarle la atencin que necesita, cuando la necesita.   Your next appointment/Tu prxima cita: follow up as scheduled/ seguimiento segn lo programado  The format for your next appointment/ El formato para su prxima cita: In Person/ En persona  Provider/ Proveedor: Mitzi Hansen "Jonni Sanger" [facility]  Other Instructions/ Otras instrucciones None/ ninguna   Lurena Joiner wants patient  To have a 3 month follow up with Dr Oval Linsey

## 2019-07-10 NOTE — Assessment & Plan Note (Signed)
Admitted to [facility] Sept 2020- re admitted in Oct 2020

## 2019-07-10 NOTE — Progress Notes (Signed)
Cardiology Office Note:    Date:  07/10/2019   ID:  Raevan Delio, DOB 1956/01/11, MRN [id number]  PCP:  Colon Branch, MD  Cardiologist:  Skeet Latch, MD  Electrophysiologist:  Thompson Grayer, MD   Referring MD: Colon Branch, MD   No chief complaint on file. Post hospital follow up  History of Present Illness:    Melinda Mckinney is a pleasant 63 y.o. female from [place] with a hx of remote PCI in [place].  She was originally evaluated by Dr. Oval Linsey in November 2018 for atrial fibrillation which was apparently new onset.  Patient's been maintained on Xarelto.  She was admitted with CO VID pneumonia in September 2020 and then readmitted in October 2020.  She was in the hospital from October night to October 13.  She then presented to [facility] emergency room 06/06/2019 with acute diverticulitis.  She was found to be in atrial fibrillation with rapid ventricular response.  She was treated with antibiotics and amiodarone was added for rate control.  The patient had congestive heart failure.  Echocardiogram showed new LV dysfunction with an EF of 25 to 30%.  She ultimately underwent right and left heart catheterization which revealed normal coronaries.  She underwent TEE cardioversion during that admission but failed to hold sinus rhythm.  Her ejection fraction was 15% by TEE.  Rate control was unable to be obtained and she ultimately underwent AV node ablation and insertion of a BiV pacemaker on 06/24/2019.  She was seen in follow-up by Dr. Marigene Ehlers on 07/03/2019.  She also has an appointment on 07/22/2019 and 08/14/2019.  She is in the office to see me today as a TOC follow-up.  She presents to the office accompanied by an interpreter.  Since discharge the patient has been doing well, she is not had unusual dyspnea but she does have fatigue.  Labs were done on 07/03/2019 that showed a potassium of 4.8 BUN of 23 and her creatinine had increased to 1.8.  There are records of phone calls  with instructions on what to do with her medications, the plan was to decrease her Lanoxin and her diuretics.  Apparently these messages never got through.    Past Medical History:  Diagnosis Date  . Anemia   . Anxiety and depression   . Asthma   . Atrial fibrillation ([place]) 07/2017  . Borderline diabetes   . CAD (coronary artery disease) ~2009   stent   . Cervical cancer ([place])    "stage 1; had cryotherapy"  . Chronic lower back pain   . DDD (degenerative disc disease), lumbar    Dr. Nelva Bush, recommends lumbar epidural steroid injections L5-S1 to the right  . Depression   . Fibromyalgia   . GERD (gastroesophageal reflux disease)   . High cholesterol   . History of hiatal hernia   . History of kidney stones   . Hypertension   . Hypothyroidism   . Osteoporosis   . Thyroid disease     Past Surgical History:  Procedure Laterality Date  . AV NODE ABLATION N/A 06/24/2019   Procedure: AV NODE ABLATION;  Surgeon: Evans Lance, MD;  Location: [place] CV LAB;  Service: Cardiovascular;  Laterality: N/A;  . BIV PACEMAKER INSERTION CRT-P N/A 06/24/2019   Procedure: BIV PACEMAKER INSERTION CRT-P;  Surgeon: Evans Lance, MD;  Location: [place] CV LAB;  Service: Cardiovascular;  Laterality: N/A;  . CARDIAC CATHETERIZATION  ~ 2008  . CARDIOVERSION N/A 08/05/2017  Procedure: CARDIOVERSION;  Surgeon: Evans Lance, MD;  Location: [place];  Service: Cardiovascular;  Laterality: N/A;  . CARDIOVERSION N/A 08/24/2017   Procedure: CARDIOVERSION;  Surgeon: Sanda Klein, MD;  Location: [place] ENDOSCOPY;  Service: Cardiovascular;  Laterality: N/A;  . CARDIOVERSION N/A 06/19/2019   Procedure: CARDIOVERSION;  Surgeon: Larey Dresser, MD;  Location: [place];  Service: Cardiovascular;  Laterality: N/A;  . CARDIOVERSION N/A 06/21/2019   Procedure: CARDIOVERSION;  Surgeon: Larey Dresser, MD;  Location: [facility] ENDOSCOPY;  Service: Cardiovascular;  Laterality: N/A;  . War; ~  1978/1979; 1984  . ESOPHAGOMYOTOMY  2009  . GYNECOLOGIC CRYOSURGERY  X 2   "stage 1 cancer"  . HERNIA REPAIR  X 6   "all in my stomach" (07/27/2017)  . KNEE ARTHROSCOPY Right   . NEPHRECTOMY Right 1991   in [place], due to fibrosis and lithiasis  . RIGHT/LEFT HEART CATH AND CORONARY ANGIOGRAPHY N/A 06/17/2019   Procedure: RIGHT/LEFT HEART CATH AND CORONARY ANGIOGRAPHY;  Surgeon: Lorretta Harp, MD;  Location: [place] CV LAB;  Service: Cardiovascular;  Laterality: N/A;  . TEE WITHOUT CARDIOVERSION N/A 06/19/2019   Procedure: TRANSESOPHAGEAL ECHOCARDIOGRAM (TEE);  Surgeon: Larey Dresser, MD;  Location: [facility] OR;  Service: Cardiovascular;  Laterality: N/A;  . TUBAL LIGATION      Current Medications: Current Meds  Medication Sig  . allopurinol (ZYLOPRIM) 100 MG tablet Take 0.5 tablets (50 mg total) by mouth daily. (Patient taking differently: Take 100 mg by mouth at bedtime. )  . atorvastatin (LIPITOR) 40 MG tablet Take 1 tablet (40 mg total) by mouth at bedtime.  . carvedilol (COREG) 3.125 MG tablet Take 1 tablet (3.125 mg total) by mouth 2 (two) times daily with a meal.  . Cholecalciferol (VITAMIN D3) 5000 UNITS CAPS Take 5,000 Units by mouth daily.   . cyclobenzaprine (FLEXERIL) 10 MG tablet Take 1 tablet (10 mg total) by mouth 2 (two) times daily as needed for muscle spasms.  . digoxin (LANOXIN) 0.125 MG tablet Take 1 tablet (0.125 mg total) by mouth daily.  . DULoxetine (CYMBALTA) 60 MG capsule Take 1 capsule (60 mg total) by mouth daily.  Marland Kitchen levothyroxine (SYNTHROID, LEVOTHROID) 125 MCG tablet TAKE ONE TABLET BY MOUTH DAILY BEFORE BREAKFAST (Patient taking differently: Take 125 mcg by mouth daily before breakfast. )  . metFORMIN (GLUCOPHAGE) 500 MG tablet Take 1 tablet (500 mg total) by mouth 2 (two) times daily with a meal.  . omeprazole (PRILOSEC) 20 MG capsule Take 1 capsule (20 mg total) by mouth 2 (two) times daily before a meal.  . raloxifene (EVISTA) 60 MG tablet Take 1  tablet (60 mg total) by mouth daily. (Patient taking differently: Take 60 mg by mouth at bedtime. )  . rivaroxaban (XARELTO) 20 MG TABS tablet Take 1 tablet (20 mg total) by mouth daily with supper.  . sacubitril-valsartan (ENTRESTO) 24-26 MG Take 1 tablet by mouth 2 (two) times daily.  Marland Kitchen spironolactone (ALDACTONE) 25 MG tablet Take 1 tablet (25 mg total) by mouth daily.  Marland Kitchen torsemide (DEMADEX) 20 MG tablet Take 2 tablets (40 mg total) by mouth daily.  Marland Kitchen zolpidem (AMBIEN) 5 MG tablet Take 1 tablet (5 mg total) by mouth at bedtime as needed. for sleep (Patient taking differently: Take 5 mg by mouth at bedtime as needed for sleep. for sleep)     Allergies:   Penicillins   Social History   Socioeconomic History  . Marital status: Married  Spouse name: Not on file  . Number of children: 3  . Years of education: Not on file  . Highest education level: Not on file  Occupational History  . 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 Financial resource strain: Not on file  . Food insecurity    Worry: Not on file    Inability: Not on file  . Transportation needs    Medical: Not on file    Non-medical: Not on file  Tobacco Use  . Smoking status: Former Smoker    Packs/day: 2.00    Years: 28.00    Pack years: 56.00    Types: Cigarettes    Quit date: 2000    Years since quitting: 20.9  . Smokeless tobacco: Never Used  Substance and Sexual Activity  . Alcohol use: Yes    Comment: 07/27/2017 "3-4 drinks//month"  . Drug use: No  . Sexual activity: Not Currently    Comment: 1ST INTERCOURSE- 16, PARTNERS - 2  Lifestyle  . Physical activity    Days per week: Not on file    Minutes per session: Not on file  . Stress: Not on file  Relationships  . Social Herbalist on phone: Not on file    Gets together: Not on file    Attends religious service: Not on file    Active member of club or organization: Not on file    Attends meetings of clubs or organizations: Not on file     Relationship status: Not on file  Other Topics Concern  . Not on file  Social History Narrative   Original from [place]   3 children, 2 alive   Household --pt and husband      Family History: The patient's family history includes Breast cancer in an other family member; CAD in her brother and father; Diabetes in her father; Lung cancer in her father and mother; Stroke in her sister. There is no history of Colon cancer.  ROS:   Please see the history of present illness.     All other systems reviewed and are negative.  EKGs/Labs/Other Studies Reviewed:    The following studies were reviewed today: TEE 06-28-2019 Rt and Lt heart cath 06/17/2019 EKG 07/03/2019- paced rhythm   Recent Labs: 05/28/2019: B Natriuretic Peptide 345.9 06/18/2019: TSH 8.342 06/22/2019: Magnesium 3.2 06/28/2019: ALT 6 07/03/2019: BUN 23; Creatinine, Ser 1.81; Hemoglobin 13.9; Platelets 386; Potassium 4.8; Sodium 136  Recent Lipid Panel    Component Value Date/Time   CHOL 217 (H) 10/17/2018 1031   TRIG 96 04/30/2019 1928   HDL 67.60 10/17/2018 1031   CHOLHDL 3 10/17/2018 1031   VLDL 31.0 10/17/2018 1031   LDLCALC 118 (H) 10/17/2018 1031   LDLDIRECT 76.0 02/06/2015 0916    Physical Exam:    VS:  Pulse 90   Temp (!) 96.4 F (35.8 C) (Temporal)   Ht 5\' 4"  (1.626 m)   Wt 221 lb (100.2 kg)   SpO2 98%   BMI 37.93 kg/m     Wt Readings from Last 3 Encounters:  07/10/19 221 lb (100.2 kg)  07/03/19 223 lb 3.2 oz (101.2 kg)  06/28/19 225 lb 9.6 oz (102.3 kg)     GEN: overweight female, well developed in no acute distress HEENT: Normal NECK: No JVD; No carotid bruits CARDIAC:RRR, no murmurs, rubs, gallops Pacer site without hematoma or ecchymosis RESPIRATORY:  Clear to auscultation without rales, wheezing or rhonchi  ABDOMEN: obese, Soft, non-tender, non-distended  MUSCULOSKELETAL:  No edema; No deformity  SKIN: Warm and dry NEUROLOGIC:  Alert and oriented x 3 PSYCHIATRIC:  Normal affect    ASSESSMENT:    Non-ischemic cardiomyopathy (HCC) Severe LVD- EF 15% by TEE [id number]  Acute systolic (congestive) heart failure ([place]) She seems compensated, possibly over diuresed based on her labs and B/P  S/P ICD (internal cardiac defibrillator) procedure S/P AVN ablation for uncontrolled AF and Bi V ICD implant for NICM Nov 2020  CAD- H/O prior PCI H/O prior PCI in [place]- no details.  She had normal coronaries at cath Nov 2020  Atrial fibrillation with RVR ([place]) Pt failed DCCV and Amiodarone- s/p AVN ablation and pacemaker Nov 2020  Acute renal insufficiency Suspect she is over diuresed- will back off on diuretics and decrease Lanoxin to renal dose. She'll have a f/u BMP next week and OV 07/22/2019 in CHF clinic.   Acute diverticulitis This appears to have resolved  Pneumonia due to COVID-19 virus Admitted to [facility] Sept 2020- re admitted in Oct 2020  PLAN:    Hold Demadex x 2 days then resume at 20 mg daily.  Decrease Aldactone to 12.5 mg daily.  Hold Lanoxin one day then resume at 0.625mg  daily.  BMP next week.  Keep f/u in CHF clinic 07/22/2019 07/24/2019- 08/04/2019 as scheduled  She can f/u with Dr Oval Linsey in 3-4 months.    Medication Adjustments/Labs and Tests Ordered: Current medicines are reviewed at length with the patient today.  Concerns regarding medicines are outlined above.  Orders Placed This Encounter  Procedures  . Basic Metabolic Panel (BMET)   No orders of the defined types were placed in this encounter.   Patient Instructions  Medication Instructions:  HOLD Digoxin for one day; if you have taken this medication today HOLD tomorrow then the next day take hald tablet once a day DECREASE Spironlactone to half tablet daily HOLD Torsemide for the next two days(if you took this medication today start tomorrow with holding this medication then take half tablet once a day  *If you need a refill on your cardiac medications before  your next appointment, please call your pharmacy*  Instrucciones de medicacin: [place] un da; Si ha tomado este medicamento hoy [place] y [place] un comprimido una [place] a la mitad de la tableta diaria [employer] la torsemida durante los prximos [employer] (si tom este medicamento hoy, comience maana manteniendo este medicamento y [employer] tome media tableta una vez al da * Si necesita reabastecer sus medicamentos para el corazn antes de su prxima cita, llame a su farmacia *  Lab Work: Your physician recommends that you return for lab work in: [employer] on July 17, 2019. (no appt is needed if you go to a lab corp or you can come here)  If you have labs (blood work) drawn today and your tests are completely normal, you will receive your results only by: Marland Kitchen MyChart Message (if you have MyChart) OR . A paper copy in the mail If you have any lab test that is abnormal or we need to change your treatment, we will call you to review the results.  Mat Carne de laboratorio: Su mdico le recomienda que regrese para los anlisis de laboratorio en: BMET el 2 de Risk analyst de 2020 (no se necesita cita si va a un laboratorio o puede venir aqu)  Si le extraen [employer] de laboratorio (anlisis de [place]) hoy y Database administrator  son completamente normales, recibir sus resultados solo por: Mensaje de MyChart (si tiene MyChart) Merla Riches copia en papel por correo Si tiene alguna prueba de laboratorio que es anormal o si necesitamos cambiar su tratamiento, lo llamaremos para revisar los [place] No necesita ayunar para su trabajo de laboratorio. Complete/hacer el trabajo de laboratorio antes de su prxima cita con Jonni Sanger  Testing/Procedures: None   Pruebas / Procedimientos: Ninguna  Follow-Up: At [employer], you and your health needs are our priority.  As part of our continuing mission to provide you with exceptional heart care, we have  created designated Provider Care Teams.  These Care Teams include your primary Cardiologist (physician) and Advanced Practice Providers (APPs -  Physician Assistants and Nurse Practitioners) who all work together to provide you with the care you need, when you need it.  Seguimiento: En [employer], usted y sus necesidades de salud son Cleotis Nipper prioridad. Como parte de nuestra misin continua de brindarle una atencin cardaca excepcional, hemos creado Equipos designados para la atencin de proveedores. Estos equipos de atencin incluyen a su cardilogo (mdico) principal y proveedores de Financial planner (APP: asistentes mdicos y enfermeras practicantes) que trabajan juntos para brindarle la atencin que necesita, cuando la necesita.   Your next appointment/Tu prxima cita: follow up as scheduled/ seguimiento segn lo programado  The format for your next appointment/ El formato para su prxima cita: In Person/ En persona  Provider/ Proveedor: Mitzi Hansen "Jonni Sanger" [facility]  Other Instructions/ Otras instrucciones None/ ninguna     Signed, Kerin Ransom, [place]  07/10/2019 10:12 AM    [facility]

## 2019-07-10 NOTE — Assessment & Plan Note (Signed)
H/O prior PCI in [place]- no details.  She had normal coronaries at cath Nov 2020

## 2019-07-10 NOTE — Assessment & Plan Note (Signed)
Suspect she is over diuresed- will back off on diuretics and decrease Lanoxin to renal dose. She'll have a f/u BMP next week and OV 07/22/2019 in CHF clinic.

## 2019-07-15 ENCOUNTER — Encounter: Payer: Self-pay | Admitting: Internal Medicine

## 2019-07-15 ENCOUNTER — Telehealth: Payer: Self-pay | Admitting: Internal Medicine

## 2019-07-15 NOTE — Telephone Encounter (Signed)
Pt spouse dropped off document to be filled out by provider. FMLA 6 pages with an envelope. Pt's spouse stated would like document to be faxed when ready at [phone]. Document put at front office tray under providers name.

## 2019-07-15 NOTE — Telephone Encounter (Signed)
Forms placed in PCP red folder for completion.

## 2019-07-16 DIAGNOSIS — Z0279 Encounter for issue of other medical certificate: Secondary | ICD-10-CM

## 2019-07-16 NOTE — Telephone Encounter (Signed)
Paperwork completed and faxed to [facility] at [phone]. Form sent for scanning. Received fax confirmation.

## 2019-07-17 DIAGNOSIS — Z95810 Presence of automatic (implantable) cardiac defibrillator: Secondary | ICD-10-CM | POA: Diagnosis not present

## 2019-07-17 DIAGNOSIS — I428 Other cardiomyopathies: Secondary | ICD-10-CM | POA: Diagnosis not present

## 2019-07-17 DIAGNOSIS — I5021 Acute systolic (congestive) heart failure: Secondary | ICD-10-CM | POA: Diagnosis not present

## 2019-07-17 LAB — BASIC METABOLIC PANEL
BUN/Creatinine Ratio: 16 (ref 12–28)
BUN: 20 mg/dL (ref 8–27)
CO2: 23 mmol/L (ref 20–29)
Calcium: 9.3 mg/dL (ref 8.7–10.3)
Chloride: 101 mmol/L (ref 96–106)
Creatinine, Ser: 1.28 mg/dL — ABNORMAL HIGH (ref 0.57–1.00)
GFR calc Af Amer: 51 mL/min/{1.73_m2} — ABNORMAL LOW (ref >59)
GFR calc non Af Amer: 44 mL/min/{1.73_m2} — ABNORMAL LOW (ref >59)
Glucose: 171 mg/dL — ABNORMAL HIGH (ref 65–99)
Potassium: 4.1 mmol/L (ref 3.5–5.2)
Sodium: 141 mmol/L (ref 134–144)

## 2019-07-19 NOTE — Progress Notes (Signed)
PCP: Colon Branch, MD  HF Cardiology: Dr. Aundra Dubin  HPI:  63 y.o. with CAD s/p PCI in 2010, chronic combined systolic and diastolic HF (EF [id number] in 2018), atrial fibrillation on chronic anticoagulation w/ Xarelto, HTN, and hypothyroidism was diagnosed w/ COVID-19 in Sep 2020 and treated at [facility] infection c/b PNA. She was discharged and readmitted back to [facility] 10/9-10/13/20 for gastroenteritis, felt to be a sequela of COVID-19. She was managed w/ supportive care and discharged home. Had f/u with PCP 10/22 and endorsed worsening LLQ pain leading to abdominal CT which showed acute diverticulitis. She was started on outpatient antibiotics, cipro + flagyl but symptoms failed to improve, prompting her to seek emergency medical care at [facility]. Initial EKG in the ED showed afib w/ RVR for which general cardiology was consulted. She was started on IV dilt for rate control but later required treatment w/ IV amiodarone due to persistent rapid ventricular response. Hospital course c/b volume overload, requiring IV diuretics. 2D echo was obtained and showed worsening LVEF, now 25-30% w/ moderate MR.She underwent subsequentR/LHC in 11/20 which demonstrated widely patent coronaries. RHC showed elevated mean RA pressure at 22, PWP 43, LVEDP 45 and low CO. CI by Fick 1.6. She underwent TEE-guided DCCV, TEE showed EF 15% with moderate RV dysfunction and moderate MR.  She went back into rapid atrial fibrillation.  She failed additional cardioversions.  Despite amiodarone gtt, HR remained in the 140s-150s.  Finally, she underwent AV nodal ablation with BiV pacing due to inability to control atrial fibrillation. While in the hospital, she was on milrinone gtt for a period of time, but we were able to taper it off. She was discharged home after medication optimization.   Recently presented to HF Clinic on 07/03/19. At that visit, she seemed to be doing well at home.  She denied dyspnea, but she stated  she does get fatigued after walking about 100 yards.  She gets lightheaded occasionally if she stands up too fast.  However, it passes quickly.  No falls.  No chest pain.  No BRPBR/melena.   Today she returns to HF clinic with an interpreter for pharmacist medication titration. At last visit with MD, torsemide was held for 2 days then decreased to 20 mg daily due to orthostasis and Scr bump. Additionally, her digoxin was decreased to 0.0625 mg daily due to elevated level. Spironolactone was decreased to 12.5 mg daily. Overall she reports feeling well today. Occasional dizziness, but this is much improved since decreasing her torsemide. No chest pains or palpitations. She does complain of continued fatigue, but this is related to her previous COVID illness a few months ago, not her heart failure or medications. SOB when going up stairs and with moderate activity, but this is much better since starting heart failure medications. Her weight has been stable at 217-220 lbs. She is taking torsemide 20 mg daily. No LEE on exam. Stable 2 pillow orthopnea, no PND. Her appetite is poor. She does not follow a low salt diet or watch her fluid intake. Her BP was 120/82 in clinic but she did not take her medications this morning.    . Shortness of breath/dyspnea on exertion? yes - when going up stairs or moderate activity . Orthopnea/PND? Yes - NO PND, sleeps on 2 pillows (stable) . Edema? no . Lightheadedness/dizziness? occasional  . Daily weights at home? Yes - ranges 217-220 lbs . Blood pressure/heart rate monitoring at home? yes . Following low-sodium/fluid-restricted diet? No -  we discussed the importance of fluid restriction and a low salt diet today.   HF Medications: Carvedilol 3.125 mg BID Entresto 24/26 mg BID Spironolactone 12.5 mg daily Digoxin 0.0625 mg daily Torsemide 20 mg daily  Has the patient been experiencing any side effects to the medications prescribed?  no  Does the patient have any  problems obtaining medications due to transportation or finances?   no  - Film/video editor plus [employer]  Understanding of regimen: fair Understanding of indications: fair Potential of compliance: good Patient understands to avoid NSAIDs. Patient understands to avoid decongestants.    Pertinent Lab Values: . Serum creatinine 1.27, BUN 23, Potassium 4.0, Sodium 138, BNP 58.4; Digoxin 1.0 ng/mL (true trough)   Vital Signs: . Weight: 223 lbs (last clinic weight: 223 lbs) . Blood pressure: 120/82 (did not take medications this AM)  . Heart rate: 80 - PPM rate decreased to 80 on 07/22/19.  Assessment: 1. Chronic systolic CHF: Nonischemic cardiomyopathy. Echo in 2018 with EF 45-50%. Echo in 11/20 with EF 25-30%, diffuse hypokinesis, mildly decreased RV systolic function, moderate MR. LHC/RHC in 11/20 with no significant coronary disease, markedly elevated filling pressures and low cardiac output (CI 1.6). 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 Cardiac MRI 11/20 showed LVEF 34%, RVEF 30%, no LGE =>perhaps pointing to tachy-mediated CMP as more likely etiology. Initially required inotropic support w/ milrinone but able to titrate off. Now s/p AV nodal ablation with MDT CRT-P.  - NYHA class II symptoms.  - She is not volume overloaded on exam.  - Vitals: BP 120/82. she did not take her medications this morning. HR 80 -Labs: Scr stable at 1.27, K 4.0. BNP improved from 345.9 1 month ago to 58.4 today.  - Continue torsemide 20 mg daily.  - Continue carvedilol 3.125 mg bid.  -Continue Entresto 24/26 bid. - Increase spironolactone to 25 daily. Repeat BMET in 1 week.  - Digoxin dose was decreased to 0.0625 mg daily on 07/03/19 after digoxin level returned at 1.4 ng/mL. Digoxin level today 1.0 ng/mL. This is a true trough as she did not take her medications this  morning. Decrease digoxin to 0.0625 mg every other day. Left voicemail with help from [place] interpreter, ID: [id number]. Repeat digoxin level at next clinic visit.  - Referred for cardiac rehab.  2. Atrial fibrillation: Persistent atrial fibrillation, it appears, at least since 9/20 when she was admitted with COVID-19. Prior, she had paroxysmal atrial fibrillation and was maintained on sotalol. She is now off sotalol. Unable to cardiovert or rate control atrial fibrillation. Therefore, she had AV nodal ablation with BiV pacing.  -Now off amiodarone. -PPM chronic rate decreased to 80 bpm on 07/22/19. Will be decreased to 60 bpm in 2-4 weeks at EP appointment.  -Continue Xarelto 20 mg daily (may need to adjust dose if creatinine does not improve with the above steps).  3. Mitral regurgitation: Functional MR, moderate on TEE. Rate was controlled on cardiac MRI, MR was less impressive.  4. AKI: Creatinine 1.27 today, AKI resolved.   Plan: 1) Medication changes: Based on clinical presentation, vital signs and recent labs will Increase spironolactone to 25 mg daily. Decrease digoxin to 0.0625 mg every other day. 2) Labs: Scr improved to 1.27, K 4.0, BNP 58.4 3) Follow-up: Repeat BMET in 1 week. HF Clinic with Dr. Aundra Dubin in 3 weeks.   Audry Riles, [name], BCPS, BCCP,  CPP Heart Failure Clinic Pharmacist [phone]

## 2019-07-22 ENCOUNTER — Ambulatory Visit (INDEPENDENT_AMBULATORY_CARE_PROVIDER_SITE_OTHER): Payer: Medicare Other | Admitting: Student

## 2019-07-22 ENCOUNTER — Other Ambulatory Visit: Payer: Self-pay

## 2019-07-22 ENCOUNTER — Encounter: Payer: Self-pay | Admitting: Student

## 2019-07-22 VITALS — BP 102/72 | HR 89 | Ht 64.0 in | Wt 229.0 lb

## 2019-07-22 DIAGNOSIS — I5022 Chronic systolic (congestive) heart failure: Secondary | ICD-10-CM

## 2019-07-22 DIAGNOSIS — I4891 Unspecified atrial fibrillation: Secondary | ICD-10-CM | POA: Diagnosis not present

## 2019-07-22 DIAGNOSIS — I255 Ischemic cardiomyopathy: Secondary | ICD-10-CM | POA: Diagnosis not present

## 2019-07-22 DIAGNOSIS — I428 Other cardiomyopathies: Secondary | ICD-10-CM | POA: Diagnosis not present

## 2019-07-22 DIAGNOSIS — N289 Disorder of kidney and ureter, unspecified: Secondary | ICD-10-CM

## 2019-07-22 LAB — CUP PACEART INCLINIC DEVICE CHECK
Battery Remaining Longevity: 80 mo
Battery Voltage: 3.19 V
Brady Statistic AP VP Percent: 0 %
Brady Statistic AP VS Percent: 0 %
Brady Statistic AS VP Percent: 0 %
Brady Statistic AS VS Percent: 0 %
Brady Statistic RA Percent Paced: 0 %
Brady Statistic RV Percent Paced: 99.91 %
Date Time Interrogation Session: 20201207102403
Implantable Lead Implant Date: 20201109
Implantable Lead Implant Date: 20201109
Implantable Lead Implant Date: 20201109
Implantable Lead Location: 753858
Implantable Lead Location: 753859
Implantable Lead Location: 753860
Implantable Lead Model: 4598
Implantable Lead Model: 5076
Implantable Lead Model: 5076
Implantable Pulse Generator Implant Date: 20201109
Lead Channel Impedance Value: 1026 Ohm
Lead Channel Impedance Value: 1045 Ohm
Lead Channel Impedance Value: 1045 Ohm
Lead Channel Impedance Value: 1254 Ohm
Lead Channel Impedance Value: 1311 Ohm
Lead Channel Impedance Value: 380 Ohm
Lead Channel Impedance Value: 551 Ohm
Lead Channel Impedance Value: 570 Ohm
Lead Channel Impedance Value: 646 Ohm
Lead Channel Impedance Value: 703 Ohm
Lead Channel Impedance Value: 874 Ohm
Lead Channel Pacing Threshold Amplitude: 0.625 V
Lead Channel Pacing Threshold Pulse Width: 0.4 ms
Lead Channel Sensing Intrinsic Amplitude: 1.125 mV
Lead Channel Sensing Intrinsic Amplitude: 1.625 mV
Lead Channel Sensing Intrinsic Amplitude: 10.5 mV
Lead Channel Setting Pacing Amplitude: 3.5 V
Lead Channel Setting Pacing Amplitude: 3.5 V
Lead Channel Setting Pacing Pulse Width: 0.4 ms
Lead Channel Setting Pacing Pulse Width: 0.8 ms
Lead Channel Setting Sensing Sensitivity: 2 mV

## 2019-07-22 NOTE — Patient Instructions (Addendum)
Medication Instructions:  none *If you need a refill on your cardiac medications before your next appointment, please call your pharmacy*  Lab Work: none If you have labs (blood work) drawn today and your tests are completely normal, you will receive your results only by: Marland Kitchen MyChart Message (if you have MyChart) OR . A paper copy in the mail If you have any lab test that is abnormal or we need to change your treatment, we will call you to review the results.  Testing/Procedures: none  Follow-Up:   08/20/19 @ 9:00 Device Clinic                        Adjust Lower Rate Limit to 60 bpm At [facility], you and your health needs are our priority.  As part of our continuing mission to provide you with exceptional heart care, we have created designated Provider Care Teams.  These Care Teams include your primary Cardiologist (physician) and Advanced Practice Providers (APPs -  Physician Assistants and Nurse Practitioners) who all work together to provide you with the care you need, when you need it.   Other Instructions Remote monitoring is used to monitor your Pacemaker from home. This monitoring reduces the number of office visits required to check your device to one time per year. It allows [place] to keep an eye on the functioning of your device to ensure it is working properly. You are scheduled for a device check from home on 09/24/19. You may send your transmission at any time that day. If you have a wireless device, the transmission will be sent automatically. After your physician reviews your transmission, you will receive a postcard with your next transmission date.

## 2019-07-22 NOTE — Progress Notes (Signed)
Electrophysiology Office Note Date: 07/22/2019  ID:  Melinda Mckinney, Alferd Apa 11-16-1955, MRN [id number]  PCP: Colon Branch, MD Primary Cardiologist: Skeet Latch, MD Electrophysiologist: Dr. Rayann Heman   CC: Pacemaker follow-up  Spanish interpreter present  Melinda Mckinney is a 63 y.o. female seen today for Dr. Rayann Heman . she presents today for routine electrophysiology followup s/p AV nodal ablation and BIV-PPM implant.  Since last being seen in our clinic, the patient reports doing well.  With recent med adjustments she is feeling well. She denies SOB with ADLs. No edema, orthopnea, or PND.  She is tolerating her medications well without difficulty.   Device History: Medtronic BiV PPM implanted 06/24/2019 for uncontrolled afib with RVR and chronic systolic CHF.   Past Medical History:  Diagnosis Date  . Anemia   . Anxiety and depression   . Asthma   . Atrial fibrillation ([place]) 07/2017  . Borderline diabetes   . CAD (coronary artery disease) ~2009   stent   . Cervical cancer ([place])    "stage 1; had cryotherapy"  . Chronic lower back pain   . DDD (degenerative disc disease), lumbar    Dr. Nelva Bush, recommends lumbar epidural steroid injections L5-S1 to the right  . Depression   . Fibromyalgia   . GERD (gastroesophageal reflux disease)   . High cholesterol   . History of hiatal hernia   . History of kidney stones   . Hypertension   . Hypothyroidism   . Osteoporosis   . Thyroid disease    Past Surgical History:  Procedure Laterality Date  . AV NODE ABLATION N/A 06/24/2019   Procedure: AV NODE ABLATION;  Surgeon: Evans Lance, MD;  Location: [place] [facility];  Service: Cardiovascular;  Laterality: N/A;  . BIV PACEMAKER INSERTION CRT-P N/A 06/24/2019   Procedure: BIV PACEMAKER INSERTION CRT-P;  Surgeon: Evans Lance, MD;  Location: [place] [facility];  Service: Cardiovascular;  Laterality: N/A;  . CARDIAC CATHETERIZATION  ~ 2008  . CARDIOVERSION N/A 08/05/2017   Procedure:  CARDIOVERSION;  Surgeon: Evans Lance, MD;  Location: [place];  Service: Cardiovascular;  Laterality: N/A;  . CARDIOVERSION N/A 08/24/2017   Procedure: CARDIOVERSION;  Surgeon: Sanda Klein, MD;  Location: [place] [facility];  Service: Cardiovascular;  Laterality: N/A;  . CARDIOVERSION N/A 06/19/2019   Procedure: CARDIOVERSION;  Surgeon: Larey Dresser, MD;  Location: [place];  Service: Cardiovascular;  Laterality: N/A;  . CARDIOVERSION N/A 06/21/2019   Procedure: CARDIOVERSION;  Surgeon: Larey Dresser, MD;  Location: [facility];  Service: Cardiovascular;  Laterality: N/A;  . [place]; ~ 1978/1979; 1984  . ESOPHAGOMYOTOMY  2009  . GYNECOLOGIC CRYOSURGERY  X 2   "stage 1 cancer"  . HERNIA REPAIR  X 6   "all in my stomach" (07/27/2017)  . KNEE ARTHROSCOPY Right   . NEPHRECTOMY Right 1991   in [place], due to fibrosis and lithiasis  . RIGHT/LEFT HEART CATH AND CORONARY ANGIOGRAPHY N/A 06/17/2019   Procedure: RIGHT/LEFT HEART CATH AND CORONARY ANGIOGRAPHY;  Surgeon: Lorretta Harp, MD;  Location: [place] [facility];  Service: Cardiovascular;  Laterality: N/A;  . TEE WITHOUT CARDIOVERSION N/A 06/19/2019   Procedure: TRANSESOPHAGEAL ECHOCARDIOGRAM (TEE);  Surgeon: Larey Dresser, MD;  Location: [facility] OR;  Service: Cardiovascular;  Laterality: N/A;  . TUBAL LIGATION      Current Outpatient Medications  Medication Sig Dispense Refill  . allopurinol (ZYLOPRIM) 100 MG tablet Take 0.5 tablets (50 mg total) by mouth  daily. (Patient taking differently: Take 100 mg by mouth at bedtime. ) 45 tablet 1  . atorvastatin (LIPITOR) 40 MG tablet Take 1 tablet (40 mg total) by mouth at bedtime. 90 tablet 3  . carvedilol (COREG) 3.125 MG tablet Take 1 tablet (3.125 mg total) by mouth 2 (two) times daily with a meal. 60 tablet 0  . Cholecalciferol (VITAMIN D3) 5000 UNITS CAPS Take 5,000 Units by mouth daily.     . cyclobenzaprine (FLEXERIL) 10 MG tablet Take 1 tablet (10 mg total) by mouth 2  (two) times daily as needed for muscle spasms. 60 tablet 1  . digoxin (LANOXIN) 0.125 MG tablet Take 0.5 tablets (0.0625 mg total) by mouth daily. 30 tablet 0  . DULoxetine (CYMBALTA) 60 MG capsule Take 1 capsule (60 mg total) by mouth daily. 90 capsule 1  . levothyroxine (SYNTHROID, LEVOTHROID) 125 MCG tablet TAKE ONE TABLET BY MOUTH DAILY BEFORE BREAKFAST (Patient taking differently: Take 125 mcg by mouth daily before breakfast. ) 90 tablet 3  . metFORMIN (GLUCOPHAGE) 500 MG tablet Take 1 tablet (500 mg total) by mouth 2 (two) times daily with a meal. 180 tablet 1  . omeprazole (PRILOSEC) 20 MG capsule Take 1 capsule (20 mg total) by mouth 2 (two) times daily before a meal. 180 capsule 3  . raloxifene (EVISTA) 60 MG tablet Take 1 tablet (60 mg total) by mouth daily. (Patient taking differently: Take 60 mg by mouth at bedtime. ) 30 tablet 11  . rivaroxaban (XARELTO) 20 MG TABS tablet Take 1 tablet (20 mg total) by mouth daily with supper. 90 tablet 3  . sacubitril-valsartan (ENTRESTO) 24-26 MG Take 1 tablet by mouth 2 (two) times daily. 60 tablet 0  . spironolactone (ALDACTONE) 25 MG tablet Take 0.5 tablets (12.5 mg total) by mouth daily. 30 tablet 0  . torsemide (DEMADEX) 20 MG tablet Take 1 tablet (20 mg total) by mouth daily. 30 tablet 0  . zolpidem (AMBIEN) 5 MG tablet Take 1 tablet (5 mg total) by mouth at bedtime as needed. for sleep (Patient taking differently: Take 5 mg by mouth at bedtime as needed for sleep. for sleep) 90 tablet 1   No current facility-administered medications for this visit.     Allergies:   Penicillins   Social History: Social History   Socioeconomic History  . Marital status: Married    Spouse name: Not on file  . Number of children: 3  . Years of education: Not on file  . Highest education level: Not on file  Occupational History  . 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 Financial resource strain: Not on file  . Food insecurity    Worry: Not on file     Inability: Not on file  . Transportation needs    Medical: Not on file    Non-medical: Not on file  Tobacco Use  . Smoking status: Former Smoker    Packs/day: 2.00    Years: 28.00    Pack years: 56.00    Types: Cigarettes    Quit date: 2000    Years since quitting: 20.9  . Smokeless tobacco: Never Used  Substance and Sexual Activity  . Alcohol use: Yes    Comment: 07/27/2017 "3-4 drinks//month"  . Drug use: No  . Sexual activity: Not Currently    Comment: 1ST INTERCOURSE- 16, PARTNERS - 2  Lifestyle  . Physical activity    Days per week: Not on file    Minutes per session: Not  on file  . Stress: Not on file  Relationships  . Social Herbalist on phone: Not on file    Gets together: Not on file    Attends religious service: Not on file    Active member of club or organization: Not on file    Attends meetings of clubs or organizations: Not on file    Relationship status: Not on file  . Intimate partner violence    Fear of current or ex partner: Not on file    Emotionally abused: Not on file    Physically abused: Not on file    Forced sexual activity: Not on file  Other Topics Concern  . Not on file  Social History Narrative   Original from [place]   3 children, 2 alive   Household --pt and husband     Family History: Family History  Problem Relation Age of Onset  . Breast cancer Other        aunt   . CAD Brother   . Lung cancer Mother        F and M  . Diabetes Father        F and mother   . CAD Father   . Lung cancer Father   . Stroke Sister   . Colon cancer Neg Hx      Review of Systems: All other systems reviewed and are otherwise negative except as noted above.  Physical Exam: Vitals:   07/22/19 0937  BP: 102/72  Pulse: 89  SpO2: 98%  Weight: 229 lb (103.9 kg)  Height: 5\' 4"  (1.626 m)     GEN- The patient is well appearing, alert and oriented x 3 today.   HEENT: normocephalic, atraumatic; sclera clear, conjunctiva pink;  hearing intact; oropharynx clear; neck supple  Lungs- Clear to ausculation bilaterally, normal work of breathing.  No wheezes, rales, rhonchi Heart- Regular rate and rhythm, no murmurs, rubs or gallops  GI- soft, non-tender, non-distended, bowel sounds present  Extremities- no clubbing, cyanosis, or edema  MS- no significant deformity or atrophy Skin- warm and dry, no rash or lesion; PPM pocket well healed Psych- euthymic mood, full affect Neuro- strength and sensation are intact  PPM Interrogation- reviewed in detail today,  See PACEART report  EKG:  EKG is not ordered today. Personal review of the ekg ordered 07/03/2019 shows V paced rhythm at 90 bpm  Recent Labs: 05/28/2019: B Natriuretic Peptide 345.9 06/18/2019: TSH 8.342 06/22/2019: Magnesium 3.2 06/28/2019: ALT 6 07/03/2019: Hemoglobin 13.9; Platelets 386 07/17/2019: BUN 20; Creatinine, Ser 1.28; Potassium 4.1; Sodium 141   Wt Readings from Last 3 Encounters:  07/22/19 229 lb (103.9 kg)  07/10/19 221 lb (100.2 kg)  07/03/19 223 lb 3.2 oz (101.2 kg)     Other studies Reviewed: Additional studies/ records that were reviewed today include: Echo TEE 06/19/2019 shows LVEF ~15%, Previous EP office notes, Previous remote checks, Most recent labwork.   Assessment and Plan:  1.  S/p AV nodal ablation due to uncontrolled AF with RVR s/p Medtronic BiV PPM  Normal PPM function See Pace Art report No changes today Her chronic rate was decreased to 80 bpm today. Will see back in 2-4 weeks to decrease to 60 bpm for chronic rate.   2. Persistent atrial fibrillation Doing well s/p AV nodal ablation Continue Xarelto, follow Cr for dosing Continue digoxin as tolerated  3. Chronic systolic CHF She has visit with HF pharmacist 07/24/2019 for continued med titration TEE  06/2019 showed LVEF 15% NYHA II-IIIa symptoms today.  Volume status stable on exam.  Continue current medications. Further adjustment per CHF team.  4. AKI Cr 1.81 ->  1.28 with adjustment of torsemide. No further at this time.   Current medicines are reviewed at length with the patient today.   The patient does not have concerns regarding her medicines.  The following changes were made today:  none  Disposition:   Follow up with device clinic in 2 weeks to program chronic rate to 60 bpm and with Dr. Rayann Heman in February (91 day post implant) as scheduled.     Jacalyn Lefevre, PA-C  07/22/2019 9:58 AM  [facility] [address] [place] [employer] [address] [phone] (office) [phone] (fax)

## 2019-07-24 ENCOUNTER — Ambulatory Visit (HOSPITAL_COMMUNITY)
Admission: RE | Admit: 2019-07-24 | Discharge: 2019-07-24 | Disposition: A | Discharge status: Home / Self Care | Payer: Medicare Other | Source: Ambulatory Visit | Attending: Cardiology | Admitting: Cardiology

## 2019-07-24 ENCOUNTER — Other Ambulatory Visit: Payer: Self-pay

## 2019-07-24 VITALS — BP 120/82 | HR 80 | Wt 223.0 lb

## 2019-07-24 DIAGNOSIS — Z9861 Coronary angioplasty status: Secondary | ICD-10-CM | POA: Diagnosis not present

## 2019-07-24 DIAGNOSIS — I340 Nonrheumatic mitral (valve) insufficiency: Secondary | ICD-10-CM | POA: Insufficient documentation

## 2019-07-24 DIAGNOSIS — Z79899 Other long term (current) drug therapy: Secondary | ICD-10-CM | POA: Insufficient documentation

## 2019-07-24 DIAGNOSIS — I4819 Other persistent atrial fibrillation: Secondary | ICD-10-CM | POA: Diagnosis not present

## 2019-07-24 DIAGNOSIS — I110 Hypertensive heart disease with heart failure: Secondary | ICD-10-CM | POA: Insufficient documentation

## 2019-07-24 DIAGNOSIS — Z7901 Long term (current) use of anticoagulants: Secondary | ICD-10-CM | POA: Diagnosis not present

## 2019-07-24 DIAGNOSIS — I5022 Chronic systolic (congestive) heart failure: Secondary | ICD-10-CM | POA: Diagnosis present

## 2019-07-24 DIAGNOSIS — I2510 Atherosclerotic heart disease of native coronary artery without angina pectoris: Secondary | ICD-10-CM | POA: Diagnosis not present

## 2019-07-24 DIAGNOSIS — I428 Other cardiomyopathies: Secondary | ICD-10-CM | POA: Insufficient documentation

## 2019-07-24 DIAGNOSIS — I5042 Chronic combined systolic (congestive) and diastolic (congestive) heart failure: Secondary | ICD-10-CM | POA: Diagnosis not present

## 2019-07-24 LAB — BASIC METABOLIC PANEL
Anion gap: 16 — ABNORMAL HIGH (ref 5–15)
BUN: 23 mg/dL (ref 8–23)
CO2: 23 mmol/L (ref 22–32)
Calcium: 9 mg/dL (ref 8.9–10.3)
Chloride: 99 mmol/L (ref 98–111)
Creatinine, Ser: 1.27 mg/dL — ABNORMAL HIGH (ref 0.44–1.00)
GFR calc Af Amer: 51 mL/min — ABNORMAL LOW (ref >60)
GFR calc non Af Amer: 44 mL/min — ABNORMAL LOW (ref >60)
Glucose, Bld: 192 mg/dL — ABNORMAL HIGH (ref 70–99)
Potassium: 4 mmol/L (ref 3.5–5.1)
Sodium: 138 mmol/L (ref 135–145)

## 2019-07-24 LAB — DIGOXIN LEVEL: Digoxin Level: 1 ng/mL (ref 0.8–2.0)

## 2019-07-24 LAB — BRAIN NATRIURETIC PEPTIDE: B Natriuretic Peptide: 58.4 pg/mL (ref 0.0–100.0)

## 2019-07-24 MED ORDER — DIGOXIN 125 MCG PO TABS: 0.0625 mg | ORAL_TABLET | ORAL | 3 refills | Status: DC

## 2019-07-24 MED ORDER — SPIRONOLACTONE 25 MG PO TABS: 25.0000 mg | ORAL_TABLET | Freq: Every day | ORAL | 11 refills | Status: DC

## 2019-07-24 MED ORDER — ALLOPURINOL 100 MG PO TABS: 100.0000 mg | ORAL_TABLET | Freq: Every day | ORAL | 1 refills | Status: DC

## 2019-07-24 NOTE — Patient Instructions (Signed)
It was a pleasure seeing you today!  MEDICATIONS: -We are changing your medications today -Increase spironolactone to 25 mg (1 tab) daily -Call if you have questions about your medications.  LABS: -We will call you if your labs need attention.  NEXT APPOINTMENT: Return to clinic in 1 week for labs, and in 3 weeks with Dr. Aundra Dubin.  In general, to take care of your heart failure: -Limit your fluid intake to 2 Liters (half-gallon) per day.   -Limit your salt intake to ideally 2-3 grams (2000-3000 mg) per day. -Weigh yourself daily and record, and bring that "weight diary" to your next appointment.  (Weight gain of 2-3 pounds in 1 day typically means fluid weight.) -The medications for your heart are to help your heart and help you live longer.   -Please contact us before stopping any of your heart medications.  Call the clinic at [phone] with questions or to reschedule future appointments.

## 2019-07-27 ENCOUNTER — Other Ambulatory Visit: Payer: Self-pay | Admitting: Internal Medicine

## 2019-07-27 DIAGNOSIS — I4819 Other persistent atrial fibrillation: Secondary | ICD-10-CM

## 2019-07-30 ENCOUNTER — Other Ambulatory Visit: Payer: Self-pay

## 2019-07-30 ENCOUNTER — Other Ambulatory Visit (HOSPITAL_COMMUNITY): Payer: Self-pay

## 2019-07-30 ENCOUNTER — Ambulatory Visit (HOSPITAL_COMMUNITY)
Admission: RE | Admit: 2019-07-30 | Discharge: 2019-07-30 | Disposition: A | Discharge status: Home / Self Care | Payer: Medicare Other | Source: Ambulatory Visit | Attending: Cardiology | Admitting: Cardiology

## 2019-07-30 DIAGNOSIS — I5022 Chronic systolic (congestive) heart failure: Secondary | ICD-10-CM | POA: Insufficient documentation

## 2019-07-30 LAB — BASIC METABOLIC PANEL
Anion gap: 13 (ref 5–15)
BUN: 21 mg/dL (ref 8–23)
CO2: 27 mmol/L (ref 22–32)
Calcium: 9.4 mg/dL (ref 8.9–10.3)
Chloride: 99 mmol/L (ref 98–111)
Creatinine, Ser: 1.3 mg/dL — ABNORMAL HIGH (ref 0.44–1.00)
GFR calc Af Amer: 50 mL/min — ABNORMAL LOW (ref >60)
GFR calc non Af Amer: 43 mL/min — ABNORMAL LOW (ref >60)
Glucose, Bld: 205 mg/dL — ABNORMAL HIGH (ref 70–99)
Potassium: 3.9 mmol/L (ref 3.5–5.1)
Sodium: 139 mmol/L (ref 135–145)

## 2019-08-05 ENCOUNTER — Other Ambulatory Visit: Payer: Self-pay | Admitting: Internal Medicine

## 2019-08-07 ENCOUNTER — Other Ambulatory Visit: Payer: Self-pay | Admitting: Cardiology

## 2019-08-14 ENCOUNTER — Encounter (HOSPITAL_COMMUNITY): Payer: Self-pay | Admitting: Cardiology

## 2019-08-14 ENCOUNTER — Other Ambulatory Visit: Payer: Self-pay

## 2019-08-14 ENCOUNTER — Ambulatory Visit (HOSPITAL_COMMUNITY)
Admission: RE | Admit: 2019-08-14 | Discharge: 2019-08-14 | Disposition: A | Discharge status: Home / Self Care | Payer: Medicare Other | Source: Ambulatory Visit | Attending: Cardiology | Admitting: Cardiology

## 2019-08-14 VITALS — BP 122/76 | HR 80 | Wt 223.6 lb

## 2019-08-14 DIAGNOSIS — K219 Gastro-esophageal reflux disease without esophagitis: Secondary | ICD-10-CM | POA: Diagnosis not present

## 2019-08-14 DIAGNOSIS — Z8619 Personal history of other infectious and parasitic diseases: Secondary | ICD-10-CM | POA: Diagnosis not present

## 2019-08-14 DIAGNOSIS — M797 Fibromyalgia: Secondary | ICD-10-CM | POA: Insufficient documentation

## 2019-08-14 DIAGNOSIS — I2510 Atherosclerotic heart disease of native coronary artery without angina pectoris: Secondary | ICD-10-CM | POA: Diagnosis not present

## 2019-08-14 DIAGNOSIS — I5042 Chronic combined systolic (congestive) and diastolic (congestive) heart failure: Secondary | ICD-10-CM | POA: Insufficient documentation

## 2019-08-14 DIAGNOSIS — I5022 Chronic systolic (congestive) heart failure: Secondary | ICD-10-CM

## 2019-08-14 DIAGNOSIS — Z801 Family history of malignant neoplasm of trachea, bronchus and lung: Secondary | ICD-10-CM | POA: Diagnosis not present

## 2019-08-14 DIAGNOSIS — Z87891 Personal history of nicotine dependence: Secondary | ICD-10-CM | POA: Diagnosis not present

## 2019-08-14 DIAGNOSIS — Z905 Acquired absence of kidney: Secondary | ICD-10-CM | POA: Insufficient documentation

## 2019-08-14 DIAGNOSIS — Z7984 Long term (current) use of oral hypoglycemic drugs: Secondary | ICD-10-CM | POA: Insufficient documentation

## 2019-08-14 DIAGNOSIS — Z8249 Family history of ischemic heart disease and other diseases of the circulatory system: Secondary | ICD-10-CM | POA: Diagnosis not present

## 2019-08-14 DIAGNOSIS — E039 Hypothyroidism, unspecified: Secondary | ICD-10-CM | POA: Diagnosis not present

## 2019-08-14 DIAGNOSIS — Z803 Family history of malignant neoplasm of breast: Secondary | ICD-10-CM | POA: Diagnosis not present

## 2019-08-14 DIAGNOSIS — Z823 Family history of stroke: Secondary | ICD-10-CM | POA: Diagnosis not present

## 2019-08-14 DIAGNOSIS — Z8541 Personal history of malignant neoplasm of cervix uteri: Secondary | ICD-10-CM | POA: Insufficient documentation

## 2019-08-14 DIAGNOSIS — I428 Other cardiomyopathies: Secondary | ICD-10-CM | POA: Insufficient documentation

## 2019-08-14 DIAGNOSIS — I340 Nonrheumatic mitral (valve) insufficiency: Secondary | ICD-10-CM | POA: Insufficient documentation

## 2019-08-14 DIAGNOSIS — Z79890 Hormone replacement therapy (postmenopausal): Secondary | ICD-10-CM | POA: Insufficient documentation

## 2019-08-14 DIAGNOSIS — Z79899 Other long term (current) drug therapy: Secondary | ICD-10-CM | POA: Diagnosis not present

## 2019-08-14 DIAGNOSIS — Z7901 Long term (current) use of anticoagulants: Secondary | ICD-10-CM | POA: Insufficient documentation

## 2019-08-14 DIAGNOSIS — Z833 Family history of diabetes mellitus: Secondary | ICD-10-CM | POA: Diagnosis not present

## 2019-08-14 DIAGNOSIS — I4819 Other persistent atrial fibrillation: Secondary | ICD-10-CM | POA: Diagnosis not present

## 2019-08-14 DIAGNOSIS — Z9861 Coronary angioplasty status: Secondary | ICD-10-CM | POA: Diagnosis not present

## 2019-08-14 LAB — BASIC METABOLIC PANEL
Anion gap: 16 — ABNORMAL HIGH (ref 5–15)
BUN: 20 mg/dL (ref 8–23)
CO2: 19 mmol/L — ABNORMAL LOW (ref 22–32)
Calcium: 9.7 mg/dL (ref 8.9–10.3)
Chloride: 103 mmol/L (ref 98–111)
Creatinine, Ser: 1.27 mg/dL — ABNORMAL HIGH (ref 0.44–1.00)
GFR calc Af Amer: 51 mL/min — ABNORMAL LOW (ref >60)
GFR calc non Af Amer: 44 mL/min — ABNORMAL LOW (ref >60)
Glucose, Bld: 176 mg/dL — ABNORMAL HIGH (ref 70–99)
Potassium: 3.7 mmol/L (ref 3.5–5.1)
Sodium: 138 mmol/L (ref 135–145)

## 2019-08-14 LAB — DIGOXIN LEVEL: Digoxin Level: <0.2 ng/mL — ABNORMAL LOW (ref 0.8–2.0)

## 2019-08-14 LAB — CBC
HCT: 42.8 % (ref 36.0–46.0)
Hemoglobin: 12.7 g/dL (ref 12.0–15.0)
MCH: 28 pg (ref 26.0–34.0)
MCHC: 29.7 g/dL — ABNORMAL LOW (ref 30.0–36.0)
MCV: 94.3 fL (ref 80.0–100.0)
Platelets: 309 10*3/uL (ref 150–400)
RBC: 4.54 MIL/uL (ref 3.87–5.11)
RDW: 17.7 % — ABNORMAL HIGH (ref 11.5–15.5)
WBC: 6 10*3/uL (ref 4.0–10.5)
nRBC: 0 % (ref 0.0–0.2)

## 2019-08-14 NOTE — Progress Notes (Signed)
Advanced HF Clinic Progress Note    PCP: Colon Branch, MD  HF Cardiology: Dr. Aundra Dubin  63 y.o. with CAD s/p PCI in 2010, chronic combined systolic and diastolic HF (EF [id number] in 2018), atrial fibrillation on chronic anticoagulation w/ Xarelto, HTN, and hypothyroidism was diagnosed w/ COVID-19 in Sep 2020 and treated at [facility] infection c/b PNA. She was discharged and readmitted back to [facility] 10/9-10/13/20 for gastroenteritis, felt to be a sequela of COVID-19. She was managed w/ supportive care and discharged home. Had f/u with PCP 10/22 and endorsed worsening LLQ pain leading to abdominal CT which showed acute diverticulitis. She was started on outpatient antibiotics, cipro + flagyl but symptoms failed to improve, prompting her to seek emergency medical care at [facility].  Initial EKG in the ED showed afib w/ RVR for which general cardiology was consulted. She was started on IV dilt for rate control but later required treatment w/ IV amiodarone due to persistent rapid ventricular response. Hospital course c/b volume overload, requiring IV diuretics. 2D echo was obtained and showed worsening LVEF, now 25-30% w/ moderate MR. She underwent subsequent R/LHC in 11/20 which demonstrated widely patent coronaries. RHC showed elevated mean RA pressure at 22, PWP 43, LVEDP 45 and low CO. CI by Fick 1.6. She underwent TEE-guided DCCV, TEE showed EF 15% with moderate RV dysfunction and moderate MR.  She went back into rapid atrial fibrillation.  She failed additional cardioversions.  Despite amiodarone gtt, HR remained in the 140s-150s.  Finally, she underwent AV nodal ablation with BiV pacing due to inability to control atrial fibrillation. While in the hospital, she was on milrinone gtt for a period of time, but we were able to taper it off. She was discharged home after medication optimization.   At last visit with MD 11/18, torsemide was held for 2 days then decreased to 20 mg daily due to  orthostasis and Scr bump to 1.8. Additionally, her digoxin was decreased to 0.0625 mg daily due to elevated level, 1.4. Spironolactone was decreased to 12.5 mg daily. She had f/u on 12/9 w/ clinic [name] with improved symptoms. Still with occasional dizziness but much improved after torsemide decrease. Volume status and BP stable. Labs were repeated. SCr improved to 1.2. Digoxin level was 1.0 ng/mL. True trough as she did not take her meds prior to labs. She was instructed to decrease digoxin to 0.0626 mg every other day. Spironolactone was also increased to 25 mg daily. BMP repeated on 12/9 and stable. SCr 1.30. K 3.9.   She presents back to clinic today for follow-up.  Here with Spanish interpreter and her husband.  She feels markedly better.  No exertional dyspnea with mild to moderate physical activity.  Denies chest pain.  Occasionally feels dizzy with positional changes but no syncope/near syncope.  Reports full compliance with medications.  BP prior to taking her meds this morning is 122/76.  Fully compliant with daily weights.  Weight has been stable at home at 220 pounds.  Denies any lower extremity edema.  No orthopnea or PND.  Tries to avoid salt in diet.  Fully compliant with Xarelto.  Denies any abnormal bleeding.  No falls.  Labs (11/20): digoxin level 1.4, creatinine 1.81, K 4.8, hgb 13.9          (12/19) digoxin level 1.0, SCr 1.27, K 4.0          (12/15) Scr 1.30, K 3.9    PMH: 1. Hypothyroidism 2. Single kidney  s/p nephrectomy 3. Atrial fibrillation: Initially diagnosed in 2018.  She was on sotalol at one point to maintain NSR.  - Uncontrollable atrial fibrillation during 11/20 admission, patient eventually had AV nodal ablation with CRT-P implantation.  4. H/o achalasia 5. GERD 6. Fibromyalgia 7. H/o diverticulitis in 10/20 8. COVID-19 in 9/20.  9. H/o cervical cancer: cryotherapy.  10. Degenerative disc disease.  11. Chronic systolic CHF: Nonischemic cardiomyopathy, possibly  tachycardia-mediated in setting of uncontrolled atrial fibrillation. Medtronic CRT-P device.  - Echo (2018): EF 45-50%.  - Echo (11/20): EF 25-30%, mildly decreased RV systolic function, moderate MR.  - TEE (11/20): EF 15%, moderately decreased RV systolic function, moderate MR.  - Cardiac MRI (11/20): EF 34%, RV EF 30%, no LGE (done after AV nodal ablation and BiV pacing).  - RHC (11/20): mean RA 22, mean PCWP 43, LVEDP 45, CI 1.6 (Fick) and 2.7 (thermodilution).  12. CAD: PCI in 2010.  - LHC (11/20): No significant coronary disease.   Social History   Socioeconomic History  . Marital status: Married    Spouse name: Not on file  . Number of children: 3  . Years of education: Not on file  . Highest education level: Not on file  Occupational History  . 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 Smoking status: Former Smoker    Packs/day: 2.00    Years: 28.00    Pack years: 56.00    Types: Cigarettes    Quit date: 2000    Years since quitting: 21.0  . Smokeless tobacco: Never Used  Substance and Sexual Activity  . Alcohol use: Yes    Comment: 07/27/2017 "3-4 drinks//month"  . Drug use: No  . Sexual activity: Not Currently    Comment: 1ST INTERCOURSE- 16, PARTNERS - 2  Other Topics Concern  . Not on file  Social History Narrative   Original from [place]   3 children, 2 alive   Household --pt and husband    Social Determinants of Radio broadcast assistant Strain:   . Difficulty of Paying Living Expenses: Not on file  Food Insecurity:   . Worried About Charity fundraiser in the Last Year: Not on file  . Ran Out of Food in the Last Year: Not on file  Transportation Needs:   . Lack of Transportation (Medical): Not on file  . Lack of Transportation (Non-Medical): Not on file  Physical Activity:   . Days of Exercise per Week: Not on file  . Minutes of Exercise per Session: Not on file  Stress:   . Feeling of Stress : Not on file  Social Connections:   . Frequency of  Communication with Friends and Family: Not on file  . Frequency of Social Gatherings with Friends and Family: Not on file  . Attends Religious Services: Not on file  . Active Member of Clubs or Organizations: Not on file  . Attends Archivist Meetings: Not on file  . Marital Status: Not on file  Intimate Partner Violence:   . Fear of Current or Ex-Partner: Not on file  . Emotionally Abused: Not on file  . Physically Abused: Not on file  . Sexually Abused: Not on file   Family History  Problem Relation Age of Onset  . Breast cancer Other        aunt   . CAD Brother   . Lung cancer Mother        F and M  . Diabetes  Father        F and mother   . CAD Father   . Lung cancer Father   . Stroke Sister   . Colon cancer Neg Hx    ROS: All systems reviewed and negative except as per HPI.   Current Outpatient Medications  Medication Sig Dispense Refill  . allopurinol (ZYLOPRIM) 100 MG tablet Take 1 tablet (100 mg total) by mouth daily. 90 tablet 1  . atorvastatin (LIPITOR) 40 MG tablet Take 1 tablet (40 mg total) by mouth at bedtime. 90 tablet 3  . carvedilol (COREG) 3.125 MG tablet Take 1 tablet (3.125 mg total) by mouth 2 (two) times daily with a meal. 60 tablet 0  . Cholecalciferol (VITAMIN D3) 5000 UNITS CAPS Take 5,000 Units by mouth daily.     . cyclobenzaprine (FLEXERIL) 10 MG tablet Take 1 tablet (10 mg total) by mouth 2 (two) times daily as needed for muscle spasms. 60 tablet 1  . DULoxetine (CYMBALTA) 60 MG capsule Take 1 capsule (60 mg total) by mouth daily. 90 capsule 1  . levothyroxine (SYNTHROID, LEVOTHROID) 125 MCG tablet TAKE ONE TABLET BY MOUTH DAILY BEFORE BREAKFAST 90 tablet 3  . metFORMIN (GLUCOPHAGE) 500 MG tablet Take 1 tablet (500 mg total) by mouth 2 (two) times daily with a meal. 180 tablet 1  . omeprazole (PRILOSEC) 20 MG capsule Take 1 capsule (20 mg total) by mouth 2 (two) times daily before a meal. 180 capsule 3  . raloxifene (EVISTA) 60 MG tablet  Take 1 tablet (60 mg total) by mouth daily. 30 tablet 11  . rivaroxaban (XARELTO) 20 MG TABS tablet Take 1 tablet (20 mg total) by mouth daily with supper. 90 tablet 3  . sacubitril-valsartan (ENTRESTO) 24-26 MG Take 1 tablet by mouth 2 (two) times daily. 60 tablet 0  . spironolactone (ALDACTONE) 25 MG tablet Take 1 tablet (25 mg total) by mouth daily. 30 tablet 11  . torsemide (DEMADEX) 20 MG tablet TAKE ONE TABLET BY MOUTH DAILY 90 tablet 3  . zolpidem (AMBIEN) 5 MG tablet Take 1 tablet (5 mg total) by mouth at bedtime as needed. for sleep 90 tablet 1  . digoxin (LANOXIN) 0.125 MG tablet Take 0.5 tablets (0.0625 mg total) by mouth every other day. (Patient not taking: Reported on 08/14/2019) 8 tablet 3   No current facility-administered medications for this encounter.   BP 122/76   Pulse 80   Wt 101.4 kg (223 lb 9.6 oz)   SpO2 100%   BMI 38.38 kg/m  PHYSICAL EXAM: General:  Well appearing, pleasant female. Moderately obese, No respiratory difficulty HEENT: normal Neck: supple. no JVD. Carotids 2+ bilat; no bruits. No lymphadenopathy or thyromegaly appreciated. Cor: PMI nondisplaced. Regular rate & rhythm. No rubs, gallops or murmurs. Lungs: clear Abdomen: soft, nontender, nondistended. No hepatosplenomegaly. No bruits or masses. Good bowel sounds. Extremities: no cyanosis, clubbing, rash, edema Neuro: alert & oriented x 3, cranial nerves grossly intact. moves all 4 extremities w/o difficulty. Affect pleasant.  Assessment/Plan: 1. Chronic systolic CHF: Nonischemic cardiomyopathy. Echo in 2018 with EF 45-50%. Echo in 11/20 with EF 25-30%, diffuse hypokinesis, mildly decreased RV systolic function, moderate MR. LHC/RHC in 11/20 with no significant coronary disease, markedly elevated filling pressures and low cardiac output (CI 1.6). 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  Cardiac MRI 11/20 showed LVEF 34%, RVEF 30%, no LGE => perhaps pointing  to tachy-mediated CMP as more likely etiology.  Initially required inotropic support w/ milrinone but able to titrate off.  Now s/p AV nodal ablation with MDT CRT-P.   - Doing well symptomatically with NYHA class II symptoms.  She is not volume overloaded on exam. Home wts have been stable ~220 lb. Continues w/ mild orthostatic symptoms but no syncope, but much improved after torsemide increase. BP today prior to AM meds 122/76. Will not push Entresto titration.  - Continue Entresto 24/26 bid.    - Continue spironolactone to 25 daily.  - Digoxin 0.0625 mg every other day  Repeat digoxin level today - Continue Coreg 3.125 mg bid.  - Continue Tosemide 20 mg daily. Check BMP today. - Plan repeat echo 2/18 to reassess EF.  2. Atrial fibrillation: Persistent atrial fibrillation, it appears, at least since 9/20 when she was admitted with COVID-19. Prior, she had paroxysmal atrial fibrillation and was maintained on sotalol. She is now off sotalol.  Unable to cardiovert or rate control atrial fibrillation. Therefore, she had AV nodal ablation with BiV pacing.  - Now off amiodarone.   - Continue Xarelto 20 mg daily for stroke prophylaxis. Check BMP and CBP today  3. Mitral regurgitation: Functional MR, moderate on TEE.  Rate was controlled on cardiac MRI, MR was less impressive.   F/u in 6 wks w/ Dr. Aundra Dubin after repeat echo   Lyda Jester  PA-C 08/14/2019

## 2019-08-14 NOTE — Patient Instructions (Signed)
NO MEDICATION CHANGES TODAY!  Labs today We will only contact you if something comes back abnormal or we need to make some changes. Otherwise no news is good news!  Your physician has requested that you have an echocardiogram. Echocardiography is a painless test that uses sound waves to create images of your heart. It provides your doctor with information about the size and shape of your heart and how well your heart's chambers and valves are working. This procedure takes approximately one hour. There are no restrictions for this procedure.  Your physician recommends that you schedule a follow-up appointment in: February 2021 WITH AN ECHO!  At the [facility], you and your health needs are our priority. As part of our continuing mission to provide you with exceptional heart care, we have created designated Provider Care Teams. These Care Teams include your primary Cardiologist (physician) and Advanced Practice Providers (APPs- Physician Assistants and Nurse Practitioners) who all work together to provide you with the care you need, when you need it.   You may see any of the following providers on your designated Care Team at your next follow up: Marland Kitchen Dr Glori Bickers . Dr [name] . Darrick Grinder, NP . Lyda Jester, PA . Audry Riles, [name]   Please be sure to bring in all your medications bottles to every appointment.

## 2019-08-15 ENCOUNTER — Telehealth: Payer: Self-pay | Admitting: Internal Medicine

## 2019-08-15 NOTE — Telephone Encounter (Signed)
Pt's spouse came to office stating that pt got meds from the hospital and that she is completely out of those meds and needing refills on Entresto 24 mg tablet and Carvedilol 3.125 mg tablet. Pt wanting meds to be sent to [facility]. Please advise.

## 2019-08-20 ENCOUNTER — Other Ambulatory Visit (HOSPITAL_COMMUNITY): Payer: Self-pay | Admitting: *Deleted

## 2019-08-20 ENCOUNTER — Telehealth: Payer: Self-pay | Admitting: Emergency Medicine

## 2019-08-20 ENCOUNTER — Encounter: Payer: Self-pay | Admitting: Emergency Medicine

## 2019-08-20 ENCOUNTER — Ambulatory Visit (INDEPENDENT_AMBULATORY_CARE_PROVIDER_SITE_OTHER): Payer: Medicare Other | Admitting: *Deleted

## 2019-08-20 ENCOUNTER — Ambulatory Visit: Payer: Medicare Other | Admitting: Internal Medicine

## 2019-08-20 ENCOUNTER — Other Ambulatory Visit: Payer: Self-pay

## 2019-08-20 DIAGNOSIS — I4891 Unspecified atrial fibrillation: Secondary | ICD-10-CM

## 2019-08-20 LAB — CUP PACEART INCLINIC DEVICE CHECK
Battery Remaining Longevity: 78 mo
Battery Voltage: 3.15 V
Brady Statistic AP VP Percent: 0 %
Brady Statistic AP VS Percent: 0 %
Brady Statistic AS VP Percent: 0 %
Brady Statistic AS VS Percent: 0 %
Brady Statistic RA Percent Paced: 0 %
Brady Statistic RV Percent Paced: 99.86 %
Date Time Interrogation Session: 20210105101500
Implantable Lead Implant Date: 20201109
Implantable Lead Implant Date: 20201109
Implantable Lead Implant Date: 20201109
Implantable Lead Location: 753858
Implantable Lead Location: 753859
Implantable Lead Location: 753860
Implantable Lead Model: 4598
Implantable Lead Model: 5076
Implantable Lead Model: 5076
Implantable Pulse Generator Implant Date: 20201109
Lead Channel Impedance Value: 1083 Ohm
Lead Channel Impedance Value: 1140 Ohm
Lead Channel Impedance Value: 380 Ohm
Lead Channel Impedance Value: 494 Ohm
Lead Channel Impedance Value: 570 Ohm
Lead Channel Impedance Value: 627 Ohm
Lead Channel Impedance Value: 646 Ohm
Lead Channel Impedance Value: 817 Ohm
Lead Channel Impedance Value: 893 Ohm
Lead Channel Impedance Value: 912 Ohm
Lead Channel Impedance Value: 950 Ohm
Lead Channel Pacing Threshold Amplitude: 1 V
Lead Channel Pacing Threshold Amplitude: 1 V
Lead Channel Pacing Threshold Pulse Width: 0.8 ms
Lead Channel Pacing Threshold Pulse Width: 0.8 ms
Lead Channel Sensing Intrinsic Amplitude: 2.875 mV
Lead Channel Sensing Intrinsic Amplitude: 9.5 mV
Lead Channel Setting Pacing Amplitude: 3.5 V
Lead Channel Setting Pacing Amplitude: 3.5 V
Lead Channel Setting Pacing Pulse Width: 0.4 ms
Lead Channel Setting Pacing Pulse Width: 0.8 ms
Lead Channel Setting Sensing Sensitivity: 2 mV

## 2019-08-20 MED ORDER — SACUBITRIL-VALSARTAN 24-26 MG PO TABS: 1.0000 | ORAL_TABLET | Freq: Two times a day (BID) | ORAL | 3 refills | Status: DC

## 2019-08-20 MED ORDER — CARVEDILOL 3.125 MG PO TABS: 3.1250 mg | ORAL_TABLET | Freq: Two times a day (BID) | ORAL | 3 refills | Status: DC

## 2019-08-20 NOTE — Telephone Encounter (Signed)
Patient needs refills on Entresto and carvedilol prescribed at [facility]. Patient is completely out of both medications. Will forward to Heart failure clinic.

## 2019-08-20 NOTE — Patient Instructions (Signed)
Call Heart Failure clinic to ask for refills for Entresto and carvedilol today. Call [phone].

## 2019-08-20 NOTE — Telephone Encounter (Signed)
Opened in error

## 2019-08-20 NOTE — Progress Notes (Signed)
Pacemaker check in clinic. Normal device function. Thresholds, sensing, impedances consistent with previous measurements. Device programmed to maximize longevity. Chronic AF , 1 episode AF that has been ongoing since implant. No high ventricular rates noted. Device programmed at appropriate safety margins. Histogram distribution appropriate for patient activity level. Device programmed to optimize intrinsic conduction. Estimated longevity  6 yrs , 6 months. Patient enrolled in remote follow-up and next remote  09/24/19. Follow up with Dr Rayann Heman 09/24/19.Patient education completed.

## 2019-08-22 ENCOUNTER — Encounter: Payer: Self-pay | Admitting: Internal Medicine

## 2019-08-22 ENCOUNTER — Ambulatory Visit (INDEPENDENT_AMBULATORY_CARE_PROVIDER_SITE_OTHER): Payer: Medicare Other | Admitting: Internal Medicine

## 2019-08-22 ENCOUNTER — Other Ambulatory Visit: Payer: Self-pay

## 2019-08-22 VITALS — BP 126/80 | HR 74 | Ht 64.0 in | Wt 222.0 lb

## 2019-08-22 DIAGNOSIS — E559 Vitamin D deficiency, unspecified: Secondary | ICD-10-CM | POA: Diagnosis not present

## 2019-08-22 DIAGNOSIS — E039 Hypothyroidism, unspecified: Secondary | ICD-10-CM | POA: Diagnosis not present

## 2019-08-22 DIAGNOSIS — M810 Age-related osteoporosis without current pathological fracture: Secondary | ICD-10-CM | POA: Diagnosis not present

## 2019-08-22 LAB — T4, FREE: Free T4: 1.26 ng/dL (ref 0.60–1.60)

## 2019-08-22 LAB — TSH: TSH: 5.07 u[IU]/mL — ABNORMAL HIGH (ref 0.35–4.50)

## 2019-08-22 LAB — VITAMIN D 25 HYDROXY (VIT D DEFICIENCY, FRACTURES): VITD: 45.46 ng/mL (ref 30.00–100.00)

## 2019-08-22 MED ORDER — ALENDRONATE SODIUM 70 MG PO TABS: 70.0000 mg | ORAL_TABLET | ORAL | 3 refills | Status: DC

## 2019-08-22 NOTE — Progress Notes (Signed)
Patient ID: Melinda Mckinney, female   DOB: 14-Jun-1956, 64 y.o.   MRN: [id number]  This visit occurred during the SARS-CoV-2 public health emergency.  Safety protocols were in place, including screening questions prior to the visit, additional usage of staff PPE, and extensive cleaning of exam room while observing appropriate contact time as indicated for disinfecting solutions.   HPI  Melinda Mckinney is a 64 y.o.-year-old female, returning for f/u for vitamin D deficiency, osteoporosis, and hypothyroidism. Last visit 1 year ago.  She is here with her husband who offers part of the history, especially regarding her recent medical history, medication regimen, and timeline of symptoms. Prev. Seen by Dr. Toney Rakes, now retired.  Since last visit, she had COVID-19 04/2019. She also had admissions for diverticulitis, A. fib with RVR. Now has a pacemaker.  Osteoporosis.   I reviewed her DXA scan reports: Date Lumbar spine L1-L4 Femoral neck (FN)  09/06/2018 ([place], [place]) -2.8 RFN: -1.5 LFN: -1.1   Previously: Date L1-L4 T score FN T score  09/04/2014 ([place])  -2.8 LFN: -1.1 RFN: -1.2  03/25/2007 ([facility])  -2.6    She is on Evista since 09/2017- Rx'ed by Dr. Toney Rakes.  She tells me she stopped this several months ago.  No fractures but had 2 falls since last visit - knee locks up (after her knee surgery).  Vitamin D def  -Diagnosed in 08/2014 -No history of hypercalcemia  Reviewed her vitamin D levels: Lab Results  Component Value Date   VD25OH 32.55 04/26/2018   VD25OH 40 09/08/2016   VD25OH 29.88 (L) 08/09/2016   VD25OH 24 (L) 09/03/2015   VD25OH 29.63 (L) 08/07/2015   VD25OH 26.02 (L) 01/19/2015   VD25OH 17 (L) 12/12/2014   VD25OH 16 (L) 12/10/2014   VD25OH 24 (L) 09/12/2014   She is on 5000 unit vitamin D daily.  Reviewed history: Celiac panel was negative in 11/2014.  She has a history of abdominal pain/bloating/nausea/sometimes vomiting. She was seen by GI >> EGD >> had  surgery for achalasia.  Lab Results  Component Value Date   PTH 31 09/12/2014   CALCIUM 9.7 08/14/2019   CALCIUM 9.4 07/30/2019   CALCIUM 9.0 07/24/2019   CALCIUM 9.3 07/17/2019   CALCIUM 9.7 07/03/2019   CALCIUM 10.1 06/28/2019   CALCIUM 9.3 06/26/2019   CALCIUM 9.3 06/25/2019   CALCIUM 9.1 06/24/2019   CALCIUM 8.9 06/23/2019   She has a history of kidney stones. R nephrectomy for hydronephrosis 2/2 kidney stone.  At that time, she also had surgery for incarcerated hernia - had several hernias  No history of CKD. Last creatinine was higher, though: Lab Results  Component Value Date   BUN 20 08/14/2019   CREATININE 1.27 (H) 08/14/2019   Hypothyroidism:  Pt is on levothyroxine 125 mcg daily, taken: - in am - fasting - at least 30 min from b'fast - no Ca, Fe, MVI - + PPIs 2x a day -first dose approximately 3 hours after LT4 - not on Biotin  Latest TSH was elevated: Lab Results  Component Value Date   TSH 8.342 (H) 06/18/2019   TSH 1.627 05/25/2019   TSH 1.467 04/30/2019   TSH 1.00 10/17/2018   TSH 0.60 04/26/2018   TSH 0.61 10/04/2017   TSH 4.801 (H) 07/28/2017   TSH 0.69 06/29/2017   TSH 2.02 01/06/2017   TSH 0.52 06/29/2016   Pt denies: - feeling nodules in neck - hoarseness - choking - SOB with lying down She has a history of dysphagia  which is believed to be due to GERD.  She is on PPIs.  + FH with thyroid ds: Mother, daughter, and son. No FH of thyroid cancer. No h/o radiation tx to head or neck.  No herbal supplements. No Biotin use. No recent steroids use.   She also has a history of prior nephrectomy, osteoarthritis, A fib, history of acute CHF.  On metoprolol and Xarelto.  She had repeated cardioversions in the past.  ROS: Constitutional: no weight gain/no weight loss, no fatigue, no subjective hyperthermia, no subjective hypothermia Eyes: no blurry vision, no xerophthalmia ENT: no sore throat, + see HPI Cardiovascular: no CP/no SOB/no  palpitations/no leg swelling Respiratory: no cough/no SOB/no wheezing Gastrointestinal: no N/no V/no D/no C/+ acid reflux Musculoskeletal: no muscle aches/no joint aches Skin: no rashes, no hair loss Neurological: + tremors /no numbness/no tingling/no dizziness  I reviewed pt's medications, allergies, PMH, social hx, family hx, and changes were documented in the history of present illness. Otherwise, unchanged from my initial visit note.  Past Medical History:  Diagnosis Date  . Anemia   . Anxiety and depression   . Asthma   . Atrial fibrillation ([place]) 07/2017  . Borderline diabetes   . CAD (coronary artery disease) ~2009   stent   . Cervical cancer ([place])    "stage 1; had cryotherapy"  . Chronic lower back pain   . DDD (degenerative disc disease), lumbar    Dr. Nelva Bush, recommends lumbar epidural steroid injections L5-S1 to the right  . Depression   . Fibromyalgia   . GERD (gastroesophageal reflux disease)   . High cholesterol   . History of hiatal hernia   . History of kidney stones   . Hypertension   . Hypothyroidism   . Osteoporosis   . Thyroid disease    Past Surgical History:  Procedure Laterality Date  . AV NODE ABLATION N/A 06/24/2019   Procedure: AV NODE ABLATION;  Surgeon: Evans Lance, MD;  Location: [facility] [facility];  Service: Cardiovascular;  Laterality: N/A;  . BIV PACEMAKER INSERTION CRT-P N/A 06/24/2019   Procedure: BIV PACEMAKER INSERTION CRT-P;  Surgeon: Evans Lance, MD;  Location: [place] [facility];  Service: Cardiovascular;  Laterality: N/A;  . CARDIAC CATHETERIZATION  ~ 2008  . CARDIOVERSION N/A 08/05/2017   Procedure: CARDIOVERSION;  Surgeon: Evans Lance, MD;  Location: [facility];  Service: Cardiovascular;  Laterality: N/A;  . CARDIOVERSION N/A 08/24/2017   Procedure: CARDIOVERSION;  Surgeon: Sanda Klein, MD;  Location: [facility];  Service: Cardiovascular;  Laterality: N/A;  . CARDIOVERSION N/A 06/19/2019   Procedure: CARDIOVERSION;   Surgeon: Larey Dresser, MD;  Location: [place];  Service: Cardiovascular;  Laterality: N/A;  . CARDIOVERSION N/A 06/21/2019   Procedure: CARDIOVERSION;  Surgeon: Larey Dresser, MD;  Location: [facility];  Service: Cardiovascular;  Laterality: N/A;  . [place]; ~ 1978/1979; 1984  . ESOPHAGOMYOTOMY  2009  . GYNECOLOGIC CRYOSURGERY  X 2   "stage 1 cancer"  . HERNIA REPAIR  X 6   "all in my stomach" (07/27/2017)  . KNEE ARTHROSCOPY Right   . NEPHRECTOMY Right 1991   in [place], due to fibrosis and lithiasis  . RIGHT/LEFT HEART CATH AND CORONARY ANGIOGRAPHY N/A 06/17/2019   Procedure: RIGHT/LEFT HEART CATH AND CORONARY ANGIOGRAPHY;  Surgeon: Lorretta Harp, MD;  Location: [place] [facility];  Service: Cardiovascular;  Laterality: N/A;  . TEE WITHOUT CARDIOVERSION N/A 06/19/2019   Procedure: TRANSESOPHAGEAL ECHOCARDIOGRAM (TEE);  Surgeon: Larey Dresser, MD;  Location[facility];  Service: Cardiovascular;  Laterality: N/A;  . TUBAL LIGATION     Social History   Socioeconomic History  . Marital status: Married    Spouse name: Not on file  . Number of children: 3  . Years of education: Not on file  . Highest education level: Not on file  Occupational History  . 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 Smoking status: Former Smoker    Packs/day: 2.00    Years: 28.00    Pack years: 56.00    Types: Cigarettes    Quit date: 2000    Years since quitting: 21.0  . Smokeless tobacco: Never Used  Substance and Sexual Activity  . Alcohol use: Yes    Comment: 07/27/2017 "3-4 drinks//month"  . Drug use: No  . Sexual activity: Not Currently    Comment: 1ST INTERCOURSE- 16, PARTNERS - 2  Other Topics Concern  . Not on file  Social History Narrative   Original from [place]   3 children, 2 alive   Household --pt and husband    Social Determinants of Radio broadcast assistant Strain:   . Difficulty of Paying Living Expenses: Not on file  Food Insecurity:   .  Worried About Charity fundraiser in the Last Year: Not on file  . Ran Out of Food in the Last Year: Not on file  Transportation Needs:   . Lack of Transportation (Medical): Not on file  . Lack of Transportation (Non-Medical): Not on file  Physical Activity:   . Days of Exercise per Week: Not on file  . Minutes of Exercise per Session: Not on file  Stress:   . Feeling of Stress : Not on file  Social Connections:   . Frequency of Communication with Friends and Family: Not on file  . Frequency of Social Gatherings with Friends and Family: Not on file  . Attends Religious Services: Not on file  . Active Member of Clubs or Organizations: Not on file  . Attends Archivist Meetings: Not on file  . Marital Status: Not on file  Intimate Partner Violence:   . Fear of Current or Ex-Partner: Not on file  . Emotionally Abused: Not on file  . Physically Abused: Not on file  . Sexually Abused: Not on file   Current Outpatient Medications on File Prior to Visit  Medication Sig Dispense Refill  . allopurinol (ZYLOPRIM) 100 MG tablet Take 1 tablet (100 mg total) by mouth daily. 90 tablet 1  . atorvastatin (LIPITOR) 40 MG tablet Take 1 tablet (40 mg total) by mouth at bedtime. 90 tablet 3  . carvedilol (COREG) 3.125 MG tablet Take 1 tablet (3.125 mg total) by mouth 2 (two) times daily with a meal. 60 tablet 3  . Cholecalciferol (VITAMIN D3) 5000 UNITS CAPS Take 5,000 Units by mouth daily.     . cyclobenzaprine (FLEXERIL) 10 MG tablet Take 1 tablet (10 mg total) by mouth 2 (two) times daily as needed for muscle spasms. 60 tablet 1  . digoxin (LANOXIN) 0.125 MG tablet Take 0.5 tablets (0.0625 mg total) by mouth every other day. 8 tablet 3  . DULoxetine (CYMBALTA) 60 MG capsule Take 1 capsule (60 mg total) by mouth daily. 90 capsule 1  . levothyroxine (SYNTHROID, LEVOTHROID) 125 MCG tablet TAKE ONE TABLET BY MOUTH DAILY BEFORE BREAKFAST 90 tablet 3  . metFORMIN (GLUCOPHAGE) 500 MG tablet Take 1  tablet (500  mg total) by mouth 2 (two) times daily with a meal. 180 tablet 1  . omeprazole (PRILOSEC) 20 MG capsule Take 1 capsule (20 mg total) by mouth 2 (two) times daily before a meal. 180 capsule 3  . raloxifene (EVISTA) 60 MG tablet Take 1 tablet (60 mg total) by mouth daily. 30 tablet 11  . rivaroxaban (XARELTO) 20 MG TABS tablet Take 1 tablet (20 mg total) by mouth daily with supper. 90 tablet 3  . sacubitril-valsartan (ENTRESTO) 24-26 MG Take 1 tablet by mouth 2 (two) times daily. 60 tablet 3  . spironolactone (ALDACTONE) 25 MG tablet Take 1 tablet (25 mg total) by mouth daily. 30 tablet 11  . torsemide (DEMADEX) 20 MG tablet TAKE ONE TABLET BY MOUTH DAILY 90 tablet 3  . zolpidem (AMBIEN) 5 MG tablet Take 1 tablet (5 mg total) by mouth at bedtime as needed. for sleep 90 tablet 1   No current facility-administered medications on file prior to visit.   Allergies  Allergen Reactions  . Penicillins Swelling and Rash    Has patient had a PCN reaction causing immediate rash, facial/tongue/throat swelling, SOB or lightheadedness with hypotension: No Has patient had a PCN reaction causing severe rash involving mucus membranes or skin necrosis: No Has patient had a PCN reaction that required hospitalization: No Has patient had a PCN reaction occurring within the last 10 years: No If all of the above answers are "NO", then may proceed with Cephalosporin use.   Family History  Problem Relation Age of Onset  . Breast cancer Other        aunt   . CAD Brother   . Lung cancer Mother        F and M  . Diabetes Father        F and mother   . CAD Father   . Lung cancer Father   . Stroke Sister   . Colon cancer Neg Hx    PE: BP 126/80   Pulse 74   Ht 5\' 4"  (1.626 m)   Wt 222 lb (100.7 kg)   SpO2 99%   BMI 38.11 kg/m  Body mass index is 38.11 kg/m. Wt Readings from Last 3 Encounters:  08/22/19 222 lb (100.7 kg)  08/14/19 223 lb 9.6 oz (101.4 kg)  07/24/19 223 lb (101.2 kg)    Constitutional: overweight, in NAD Eyes: PERRLA, EOMI, no exophthalmos ENT: moist mucous membranes, no thyromegaly, no cervical lymphadenopathy Cardiovascular: RRR, No MRG Respiratory: CTA B Gastrointestinal: abdomen soft, NT, ND, BS+ Musculoskeletal: no deformities, strength intact in all 4 Skin: moist, warm, no rashes Neurological: + tremor with outstretched hands, DTR normal in all 4  Assessment: 1.  Osteoporosis  2. Vitamin D deficiency -Reviewing her chart, there is no obvious reason for malabsorption: Celiac workup was negative, no colon diseases, she had a history of Barrett's esophagus and has surveillance EGDs and also had achalasia surgery  3. Hypothyroidism  PLAN: 1. Osteoporosis -No fractures or falls since last visit -We reviewed together the latest DEXA scan from 08/2018: Lumbar spine T score was low, at -2.8, but stable.  Right femoral neck bone density was lower than before, however, the last 2 bone density scans were done on different machines so they are not directly comparable. -She was on Evista at last visit (for 4 years) but they tell me that she stopped this after she saw me last (unclear exactly when).  Therefore, at this visit, we discussed about starting a bisphosphonate.  I suggested alendronate weekly.  Discussed about benefits and possible side effects to include osteonecrosis of the jaw (no dental work in progress or pending, she is not a smoker), and atypical fractures.  I advised her to let me know if she has thigh pain.  I also advised her to take alendronate 30 minutes before Synthroid once a week, with a full glass of water and not lay down for at least an hour afterwards. -She will be due for another bone density scan in another year -Discussed about the importance of exercise in maintaining bone density.  She is not exercising now, but I advised her to start especially to improve the muscles around her knees and improve joint stability -We will check  her BMP and vitamin D level today  2.  Vitamin D insufficiency -She was previously on high-dose ergocalciferol once a week in the past, but now on daily cholecalciferol -Continues on 5000 units vitamin D daily -We will recheck vitamin D level today  3. Hypothyroidism - latest thyroid labs reviewed with pt >> TSH is elevated 2 months ago (following her COVID-19 diagnosis and probably related). I was not aware of the result.  Lab Results  Component Value Date   TSH 8.342 (H) 06/18/2019   - she continues on LT4 125 mcg daily - pt feels good on this dose. - we discussed about taking the thyroid hormone every day, with water, >30 minutes before breakfast, separated by >4 hours from acid reflux medications, calcium, iron, multivitamins. Pt. is taking it correctly. - will check thyroid tests today: TSH and fT4 - If labs are abnormal, she will need to return for repeat TFTs in 1.5 months  Needs refills LT4.  Component     Latest Ref Rng & Units 08/22/2019          Glucose     65 - 99 mg/dL 159 (H)  BUN     7 - 25 mg/dL 23  Creatinine     0.50 - 0.99 mg/dL 1.32 (H)  GFR, Est Non African American     > OR = 60 mL/min/1.54m2 42 (L)  GFR, Est African American     > OR = 60 mL/min/1.38m2 49 (L)  BUN/Creatinine Ratio     6 - 22 (calc) 17  Sodium     135 - 146 mmol/L 139  Potassium     3.5 - 5.3 mmol/L 3.7  Chloride     98 - 110 mmol/L 100  CO2     20 - 32 mmol/L 27  Calcium     8.6 - 10.4 mg/dL 10.0  TSH     0.35 - 4.50 uIU/mL 5.07 (H)  VITD     30.00 - 100.00 ng/mL 45.46  T4,Free(Direct)     0.60 - 1.60 ng/dL 1.26   Creatinine is still high, with a GFR of 42.  We can still use Fosamax. Vitamin D is normal. TSH is still elevated, so we will increase the dose of levothyroxine to 137 mcg daily and have her back for labs in 1.5 months.  Philemon Kingdom, MD PhD [facility] Endocr[facility]ology

## 2019-08-22 NOTE — Patient Instructions (Addendum)
Please stop at the lab.  Continue Levothyroxine 125 mcg daily.  Take the thyroid hormone every day, with water, at least 30 minutes before breakfast, separated by at least 4 hours from: - acid reflux medications - calcium - iron - multivitamins  Start Fosamax 70 mg weekly. Take this in am, first thing in the morning, with a full glass of water. 30 min later, you can take Levothyroxine.  Please come back for a follow-up appointment in 1 year.

## 2019-08-22 NOTE — Telephone Encounter (Signed)
Patient has medication already

## 2019-08-23 ENCOUNTER — Encounter: Payer: Self-pay | Admitting: Internal Medicine

## 2019-08-23 ENCOUNTER — Ambulatory Visit (INDEPENDENT_AMBULATORY_CARE_PROVIDER_SITE_OTHER): Payer: Medicare Other | Admitting: Internal Medicine

## 2019-08-23 ENCOUNTER — Other Ambulatory Visit: Payer: Self-pay

## 2019-08-23 VITALS — BP 115/75 | HR 72 | Temp 96.7°F | Resp 12 | Ht 64.0 in | Wt 221.2 lb

## 2019-08-23 DIAGNOSIS — E785 Hyperlipidemia, unspecified: Secondary | ICD-10-CM | POA: Diagnosis not present

## 2019-08-23 DIAGNOSIS — I2510 Atherosclerotic heart disease of native coronary artery without angina pectoris: Secondary | ICD-10-CM | POA: Diagnosis not present

## 2019-08-23 DIAGNOSIS — I4891 Unspecified atrial fibrillation: Secondary | ICD-10-CM | POA: Diagnosis not present

## 2019-08-23 DIAGNOSIS — Z9861 Coronary angioplasty status: Secondary | ICD-10-CM

## 2019-08-23 DIAGNOSIS — I10 Essential (primary) hypertension: Secondary | ICD-10-CM | POA: Diagnosis not present

## 2019-08-23 LAB — BASIC METABOLIC PANEL WITH GFR
BUN/Creatinine Ratio: 17 (calc) (ref 6–22)
BUN: 23 mg/dL (ref 7–25)
CO2: 27 mmol/L (ref 20–32)
Calcium: 10 mg/dL (ref 8.6–10.4)
Chloride: 100 mmol/L (ref 98–110)
Creat: 1.32 mg/dL — ABNORMAL HIGH (ref 0.50–0.99)
GFR, Est African American: 49 mL/min/{1.73_m2} — ABNORMAL LOW (ref >=60)
GFR, Est Non African American: 42 mL/min/{1.73_m2} — ABNORMAL LOW (ref >=60)
Glucose, Bld: 159 mg/dL — ABNORMAL HIGH (ref 65–99)
Potassium: 3.7 mmol/L (ref 3.5–5.3)
Sodium: 139 mmol/L (ref 135–146)

## 2019-08-23 LAB — LIPID PANEL
Cholesterol: 186 mg/dL (ref 0–200)
HDL: 69.4 mg/dL (ref >39.00)
LDL Cholesterol: 91 mg/dL (ref 0–99)
NonHDL: 117.06
Total CHOL/HDL Ratio: 3
Triglycerides: 128 mg/dL (ref 0.0–149.0)
VLDL: 25.6 mg/dL (ref 0.0–40.0)

## 2019-08-23 MED ORDER — OMEPRAZOLE 20 MG PO CPDR: 20.0000 mg | DELAYED_RELEASE_CAPSULE | Freq: Two times a day (BID) | ORAL | 3 refills | Status: DC

## 2019-08-23 MED ORDER — LEVOTHYROXINE SODIUM 137 MCG PO TABS: 137.0000 ug | ORAL_TABLET | Freq: Every day | ORAL | 3 refills | Status: DC

## 2019-08-23 NOTE — Progress Notes (Signed)
Subjective:    Patient ID: Melinda Mckinney, female    DOB: Dec 25, 1955, 64 y.o.   MRN: [id number]  DOS:  08/23/2019 Type of visit - description: Routine office visit. Notes from cardiology reviewed Medication list reviewed Note from endocrinology reviewed   BP Readings from Last 3 Encounters:  08/23/19 (!) 180/137  08/22/19 126/80  08/14/19 122/76     Review of Systems  Denies chest pain, difficulty breathing. No edema  Past Medical History:  Diagnosis Date  . Anemia   . Anxiety and depression   . Asthma   . Atrial fibrillation ([place]) 07/2017  . Borderline diabetes   . CAD (coronary artery disease) ~2009   stent   . Cervical cancer ([place])    "stage 1; had cryotherapy"  . Chronic lower back pain   . DDD (degenerative disc disease), lumbar    Dr. Nelva Bush, recommends lumbar epidural steroid injections L5-S1 to the right  . Depression   . Fibromyalgia   . GERD (gastroesophageal reflux disease)   . High cholesterol   . History of hiatal hernia   . History of kidney stones   . Hypertension   . Hypothyroidism   . Osteoporosis   . Thyroid disease     Past Surgical History:  Procedure Laterality Date  . AV NODE ABLATION N/A 06/24/2019   Procedure: AV NODE ABLATION;  Surgeon: Evans Lance, MD;  Location: [facility];  Service: Cardiovascular;  Laterality: N/A;  . BIV PACEMAKER INSERTION CRT-P N/A 06/24/2019   Procedure: BIV PACEMAKER INSERTION CRT-P;  Surgeon: Evans Lance, MD;  Location: [facility];  Service: Cardiovascular;  Laterality: N/A;  . CARDIAC CATHETERIZATION  ~ 2008  . CARDIOVERSION N/A 08/05/2017   Procedure: CARDIOVERSION;  Surgeon: Evans Lance, MD;  Location: [facility];  Service: Cardiovascular;  Laterality: N/A;  . CARDIOVERSION N/A 08/24/2017   Procedure: CARDIOVERSION;  Surgeon: Sanda Klein, MD;  Location: [facility];  Service: Cardiovascular;  Laterality: N/A;  . CARDIOVERSION N/A 06/19/2019   Procedure: CARDIOVERSION;  Surgeon:  Larey Dresser, MD;  Location: [facility];  Service: Cardiovascular;  Laterality: N/A;  . CARDIOVERSION N/A 06/21/2019   Procedure: CARDIOVERSION;  Surgeon: Larey Dresser, MD;  Location: [facility];  Service: Cardiovascular;  Laterality: N/A;  . [place]; ~ 1978/1979; 1984  . ESOPHAGOMYOTOMY  2009  . GYNECOLOGIC CRYOSURGERY  X 2   "stage 1 cancer"  . HERNIA REPAIR  X 6   "all in my stomach" (07/27/2017)  . KNEE ARTHROSCOPY Right   . NEPHRECTOMY Right 1991   in [place], due to fibrosis and lithiasis  . RIGHT/LEFT HEART CATH AND CORONARY ANGIOGRAPHY N/A 06/17/2019   Procedure: RIGHT/LEFT HEART CATH AND CORONARY ANGIOGRAPHY;  Surgeon: Lorretta Harp, MD;  Location: [facility];  Service: Cardiovascular;  Laterality: N/A;  . TEE WITHOUT CARDIOVERSION N/A 06/19/2019   Procedure: TRANSESOPHAGEAL ECHOCARDIOGRAM (TEE);  Surgeon: Larey Dresser, MD;  Location: [facility] OR;  Service: Cardiovascular;  Laterality: N/A;  . TUBAL LIGATION      Social History   Socioeconomic History  . Marital status: Married    Spouse name: Not on file  . Number of children: 3  . Years of education: Not on file  . Highest education level: Not on file  Occupational History  . 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 Smoking status: Former Smoker    Packs/day: 2.00    Years: 28.00  Pack years: 56.00    Types: Cigarettes    Quit date: 2000    Years since quitting: 21.0  . Smokeless tobacco: Never Used  Substance and Sexual Activity  . Alcohol use: Yes    Comment: 07/27/2017 "3-4 drinks//month"  . Drug use: No  . Sexual activity: Not Currently    Comment: 1ST INTERCOURSE- 16, PARTNERS - 2  Other Topics Concern  . Not on file  Social History Narrative   Original from [place]   3 children, 2 alive   Household --pt and husband    Social Determinants of Radio broadcast assistant Strain:   . Difficulty of Paying Living Expenses: Not on file  Food Insecurity:   . Worried  About Charity fundraiser in the Last Year: Not on file  . Ran Out of Food in the Last Year: Not on file  Transportation Needs:   . Lack of Transportation (Medical): Not on file  . Lack of Transportation (Non-Medical): Not on file  Physical Activity:   . Days of Exercise per Week: Not on file  . Minutes of Exercise per Session: Not on file  Stress:   . Feeling of Stress : Not on file  Social Connections:   . Frequency of Communication with Friends and Family: Not on file  . Frequency of Social Gatherings with Friends and Family: Not on file  . Attends Religious Services: Not on file  . Active Member of Clubs or Organizations: Not on file  . Attends Archivist Meetings: Not on file  . Marital Status: Not on file  Intimate Partner Violence:   . Fear of Current or Ex-Partner: Not on file  . Emotionally Abused: Not on file  . Physically Abused: Not on file  . Sexually Abused: Not on file      Allergies as of 08/23/2019      Reactions   Penicillins Swelling, Rash   Has patient had a PCN reaction causing immediate rash, facial/tongue/throat swelling, SOB or lightheadedness with hypotension: No Has patient had a PCN reaction causing severe rash involving mucus membranes or skin necrosis: No Has patient had a PCN reaction that required hospitalization: No Has patient had a PCN reaction occurring within the last 10 years: No If all of the above answers are "NO", then may proceed with Cephalosporin use.      Medication List       Accurate as of August 23, 2019  8:50 AM. If you have any questions, ask your nurse or doctor.        alendronate 70 MG tablet Commonly known as: FOSAMAX Take 1 tablet (70 mg total) by mouth every 7 (seven) days. Take with a full glass of water on an empty stomach.   allopurinol 100 MG tablet Commonly known as: ZYLOPRIM Take 1 tablet (100 mg total) by mouth daily.   atorvastatin 40 MG tablet Commonly known as: LIPITOR Take 1 tablet (40 mg  total) by mouth at bedtime.   carvedilol 3.125 MG tablet Commonly known as: COREG Take 1 tablet (3.125 mg total) by mouth 2 (two) times daily with a meal.   cyclobenzaprine 10 MG tablet Commonly known as: FLEXERIL Take 1 tablet (10 mg total) by mouth 2 (two) times daily as needed for muscle spasms.   digoxin 0.125 MG tablet Commonly known as: LANOXIN Take 0.5 tablets (0.0625 mg total) by mouth every other day.   DULoxetine 60 MG capsule Commonly known as: CYMBALTA Take 1 capsule (60  mg total) by mouth daily.   levothyroxine 125 MCG tablet Commonly known as: SYNTHROID TAKE ONE TABLET BY MOUTH DAILY BEFORE BREAKFAST   metFORMIN 500 MG tablet Commonly known as: GLUCOPHAGE Take 1 tablet (500 mg total) by mouth 2 (two) times daily with a meal.   omeprazole 20 MG capsule Commonly known as: PRILOSEC Take 1 capsule (20 mg total) by mouth 2 (two) times daily before a meal.   rivaroxaban 20 MG Tabs tablet Commonly known as: Xarelto Take 1 tablet (20 mg total) by mouth daily with supper.   sacubitril-valsartan 24-26 MG Commonly known as: ENTRESTO Take 1 tablet by mouth 2 (two) times daily.   spironolactone 25 MG tablet Commonly known as: ALDACTONE Take 1 tablet (25 mg total) by mouth daily.   torsemide 20 MG tablet Commonly known as: DEMADEX TAKE ONE TABLET BY MOUTH DAILY   Vitamin D3 125 MCG (5000 UT) Caps Take 5,000 Units by mouth daily.   zolpidem 5 MG tablet Commonly known as: AMBIEN Take 1 tablet (5 mg total) by mouth at bedtime as needed. for sleep           Objective:   Physical Exam There were no vitals taken for this visit. General:   Well developed, NAD, BMI noted. HEENT:  Normocephalic . Face symmetric, atraumatic Lungs:  CTA B Normal respiratory effort, no intercostal retractions, no accessory muscle use. Heart: RRR,  no murmur.  No pretibial edema bilaterally  Skin: Not pale. Not jaundice Neurologic:  alert & oriented X3.  Speech normal, gait  appropriate for age and unassisted Psych--  Cognition and judgment appear intact.  Cooperative with normal attention span and concentration.  Behavior appropriate. No anxious or depressed appearing.      Assessment     Assessment   Hyperglycemia HTN High cholesterol Hypothyroidism Anxiety depression, insomnia CV: --Atrial fibrillation DX 06-2017, s/p cardioversions, intolerant to metoprolol (?)  --06-2019 RVR, failed cardioversion, AV nodal ablation, ICD implanted --Known ischemic cardiomyopathy, DX 06-2019, Covid related? --CAD: stent ~ 2009 Morbid obesity: BMI 42 (2011) Vitamin D deficiency, saw Dr. Cruzita Lederer 07-2016, rtc 1 year, celiac dz test (-), unlikely  Malabsortion; cont supplements  GERD H/o achalasia, s/p H[place] 2009 SINGLE KIDNEY---S/p R  Nephrectomy ([place] 1991 due to stones/fibrosis) MSK: --Fibromyalgia --DJD  Knee pain R --Back pain -- s/p epidural 10-16 Osteoporosis: Used to be managed by gynecology, now PCP. 08-2014 T score -2.8, 09-2015: T score -2.7.- Was Rx Evista 09-2014  h/o cervical dysplasia Derm: vulvar pruritus, temovate prn ok per gyn  PLAN: HTN: Upon arrival BP was elevated, recheck by me left arm 115/75.  Continue Entresto, carvedilol, spironolactone and torsemide. Ambulatory BPs reportedly normal. High cholesterol: Last FLP slightly elevated, on atorvastatin, check FLP Hypothyroidism: Per endocrinology Anxiety, depression, insomnia: On Ambien, feeling very well emotionally. Atrial fibrillation, CAD: Since the last visit, saw cardiology twice, notes reviewed.  Medications were adjusted.  Last visit was 07/22/2019, volume status was good, creatinine improved. GERD: Refill omeprazole  RTC 6 months   This visit occurred during the SARS-CoV-2 public health emergency.  Safety protocols were in place, including screening questions prior to the visit, additional usage of staff PPE, and extensive cleaning of exam room while observing appropriate  contact time as indicated for disinfecting solutions.

## 2019-08-23 NOTE — Patient Instructions (Signed)
GO TO THE LAB : Get the blood work     GO TO THE FRONT DESK Schedule your next appointment   for a checkup in 6 months   Continue checking your blood pressures BP GOAL is between 110/65 and  135/85. If it is consistently higher or lower, let me know

## 2019-08-24 NOTE — Assessment & Plan Note (Addendum)
HTN: Upon arrival BP was elevated, recheck by me left arm 115/75.  Continue Entresto, carvedilol, spironolactone and torsemide. Ambulatory BPs reportedly normal. High cholesterol: Last FLP slightly elevated, on atorvastatin, check FLP Hypothyroidism: Per endocrinology Anxiety, depression, insomnia: On Ambien, feeling very well emotionally. Atrial fibrillation, CAD: Since the last visit, saw cardiology twice, notes reviewed.  Medications were adjusted.  Last visit was 07/22/2019, volume status was good, creatinine improved. GERD: Refill omeprazole RTC 6 months

## 2019-08-26 ENCOUNTER — Other Ambulatory Visit: Payer: Self-pay | Admitting: Internal Medicine

## 2019-08-26 MED ORDER — ATORVASTATIN CALCIUM 40 MG PO TABS: 60.0000 mg | ORAL_TABLET | Freq: Every day | ORAL | 1 refills | Status: DC

## 2019-08-28 ENCOUNTER — Other Ambulatory Visit: Payer: Self-pay | Admitting: Internal Medicine

## 2019-08-29 DIAGNOSIS — R531 Weakness: Secondary | ICD-10-CM | POA: Diagnosis not present

## 2019-08-29 DIAGNOSIS — J189 Pneumonia, unspecified organism: Secondary | ICD-10-CM | POA: Diagnosis not present

## 2019-08-29 DIAGNOSIS — U071 COVID-19: Secondary | ICD-10-CM | POA: Diagnosis not present

## 2019-08-29 DIAGNOSIS — K529 Noninfective gastroenteritis and colitis, unspecified: Secondary | ICD-10-CM | POA: Diagnosis not present

## 2019-09-01 ENCOUNTER — Encounter (HOSPITAL_COMMUNITY): Payer: Self-pay

## 2019-09-01 ENCOUNTER — Encounter: Payer: Self-pay | Admitting: Internal Medicine

## 2019-09-18 ENCOUNTER — Telehealth: Payer: Self-pay

## 2019-09-23 ENCOUNTER — Other Ambulatory Visit: Payer: Self-pay

## 2019-09-23 ENCOUNTER — Telehealth: Payer: Medicare Other | Admitting: Internal Medicine

## 2019-09-23 ENCOUNTER — Encounter: Payer: Medicare Other | Admitting: Internal Medicine

## 2019-09-24 ENCOUNTER — Ambulatory Visit (INDEPENDENT_AMBULATORY_CARE_PROVIDER_SITE_OTHER): Payer: Medicare Other | Admitting: *Deleted

## 2019-09-24 DIAGNOSIS — I4891 Unspecified atrial fibrillation: Secondary | ICD-10-CM

## 2019-09-24 LAB — CUP PACEART REMOTE DEVICE CHECK
Battery Remaining Longevity: 70 mo
Battery Voltage: 3.1 V
Brady Statistic AP VP Percent: 0 %
Brady Statistic AP VS Percent: 0 %
Brady Statistic AS VP Percent: 0 %
Brady Statistic AS VS Percent: 0 %
Brady Statistic RA Percent Paced: 0 %
Brady Statistic RV Percent Paced: 99.97 %
Date Time Interrogation Session: 20210209001723
Implantable Lead Implant Date: 20201109
Implantable Lead Implant Date: 20201109
Implantable Lead Implant Date: 20201109
Implantable Lead Location: 753858
Implantable Lead Location: 753859
Implantable Lead Location: 753860
Implantable Lead Model: 4598
Implantable Lead Model: 5076
Implantable Lead Model: 5076
Implantable Pulse Generator Implant Date: 20201109
Lead Channel Impedance Value: 1064 Ohm
Lead Channel Impedance Value: 1083 Ohm
Lead Channel Impedance Value: 342 Ohm
Lead Channel Impedance Value: 418 Ohm
Lead Channel Impedance Value: 437 Ohm
Lead Channel Impedance Value: 475 Ohm
Lead Channel Impedance Value: 570 Ohm
Lead Channel Impedance Value: 589 Ohm
Lead Channel Impedance Value: 608 Ohm
Lead Channel Impedance Value: 608 Ohm
Lead Channel Impedance Value: 741 Ohm
Lead Channel Impedance Value: 855 Ohm
Lead Channel Impedance Value: 874 Ohm
Lead Channel Impedance Value: 893 Ohm
Lead Channel Pacing Threshold Amplitude: 0.375 V
Lead Channel Pacing Threshold Pulse Width: 0.4 ms
Lead Channel Sensing Intrinsic Amplitude: 1 mV
Lead Channel Sensing Intrinsic Amplitude: 1 mV
Lead Channel Sensing Intrinsic Amplitude: 9.5 mV
Lead Channel Setting Pacing Amplitude: 3.25 V
Lead Channel Setting Pacing Amplitude: 3.5 V
Lead Channel Setting Pacing Pulse Width: 0.4 ms
Lead Channel Setting Pacing Pulse Width: 0.8 ms
Lead Channel Setting Sensing Sensitivity: 2 mV

## 2019-09-24 NOTE — Progress Notes (Signed)
PPM Remote  

## 2019-09-26 ENCOUNTER — Other Ambulatory Visit: Payer: Self-pay | Admitting: Internal Medicine

## 2019-09-26 DIAGNOSIS — Z1231 Encounter for screening mammogram for malignant neoplasm of breast: Secondary | ICD-10-CM | POA: Diagnosis not present

## 2019-09-26 LAB — HM MAMMOGRAPHY

## 2019-09-27 ENCOUNTER — Encounter: Payer: Self-pay | Admitting: Internal Medicine

## 2019-09-29 DIAGNOSIS — K529 Noninfective gastroenteritis and colitis, unspecified: Secondary | ICD-10-CM | POA: Diagnosis not present

## 2019-09-29 DIAGNOSIS — R531 Weakness: Secondary | ICD-10-CM | POA: Diagnosis not present

## 2019-10-03 ENCOUNTER — Other Ambulatory Visit (HOSPITAL_COMMUNITY): Payer: Medicare Other

## 2019-10-04 ENCOUNTER — Ambulatory Visit (HOSPITAL_COMMUNITY)
Admission: RE | Admit: 2019-10-04 | Discharge: 2019-10-04 | Disposition: A | Discharge status: Home / Self Care | Payer: Medicare Other | Source: Ambulatory Visit | Attending: Cardiology | Admitting: Cardiology

## 2019-10-04 ENCOUNTER — Other Ambulatory Visit: Payer: Self-pay

## 2019-10-04 ENCOUNTER — Ambulatory Visit (HOSPITAL_BASED_OUTPATIENT_CLINIC_OR_DEPARTMENT_OTHER)
Admission: RE | Admit: 2019-10-04 | Discharge: 2019-10-04 | Disposition: A | Discharge status: Home / Self Care | Payer: Medicare Other | Source: Ambulatory Visit | Attending: Cardiology | Admitting: Cardiology

## 2019-10-04 ENCOUNTER — Encounter (HOSPITAL_COMMUNITY): Payer: Self-pay | Admitting: Cardiology

## 2019-10-04 VITALS — BP 110/80 | HR 70 | Wt 223.8 lb

## 2019-10-04 DIAGNOSIS — Z87891 Personal history of nicotine dependence: Secondary | ICD-10-CM | POA: Diagnosis not present

## 2019-10-04 DIAGNOSIS — I130 Hypertensive heart and chronic kidney disease with heart failure and stage 1 through stage 4 chronic kidney disease, or unspecified chronic kidney disease: Secondary | ICD-10-CM | POA: Diagnosis not present

## 2019-10-04 DIAGNOSIS — E039 Hypothyroidism, unspecified: Secondary | ICD-10-CM | POA: Insufficient documentation

## 2019-10-04 DIAGNOSIS — I4819 Other persistent atrial fibrillation: Secondary | ICD-10-CM | POA: Insufficient documentation

## 2019-10-04 DIAGNOSIS — Z79899 Other long term (current) drug therapy: Secondary | ICD-10-CM | POA: Insufficient documentation

## 2019-10-04 DIAGNOSIS — Z7901 Long term (current) use of anticoagulants: Secondary | ICD-10-CM | POA: Diagnosis not present

## 2019-10-04 DIAGNOSIS — I5022 Chronic systolic (congestive) heart failure: Secondary | ICD-10-CM | POA: Diagnosis not present

## 2019-10-04 DIAGNOSIS — I428 Other cardiomyopathies: Secondary | ICD-10-CM | POA: Insufficient documentation

## 2019-10-04 DIAGNOSIS — Z7984 Long term (current) use of oral hypoglycemic drugs: Secondary | ICD-10-CM | POA: Diagnosis not present

## 2019-10-04 DIAGNOSIS — I080 Rheumatic disorders of both mitral and aortic valves: Secondary | ICD-10-CM | POA: Diagnosis not present

## 2019-10-04 DIAGNOSIS — E785 Hyperlipidemia, unspecified: Secondary | ICD-10-CM | POA: Diagnosis not present

## 2019-10-04 DIAGNOSIS — N183 Chronic kidney disease, stage 3 unspecified: Secondary | ICD-10-CM | POA: Insufficient documentation

## 2019-10-04 DIAGNOSIS — I2510 Atherosclerotic heart disease of native coronary artery without angina pectoris: Secondary | ICD-10-CM | POA: Insufficient documentation

## 2019-10-04 DIAGNOSIS — K219 Gastro-esophageal reflux disease without esophagitis: Secondary | ICD-10-CM | POA: Insufficient documentation

## 2019-10-04 DIAGNOSIS — M797 Fibromyalgia: Secondary | ICD-10-CM | POA: Insufficient documentation

## 2019-10-04 LAB — BASIC METABOLIC PANEL
Anion gap: 13 (ref 5–15)
BUN: 26 mg/dL — ABNORMAL HIGH (ref 8–23)
CO2: 24 mmol/L (ref 22–32)
Calcium: 9.7 mg/dL (ref 8.9–10.3)
Chloride: 102 mmol/L (ref 98–111)
Creatinine, Ser: 1.37 mg/dL — ABNORMAL HIGH (ref 0.44–1.00)
GFR calc Af Amer: 47 mL/min — ABNORMAL LOW (ref >60)
GFR calc non Af Amer: 40 mL/min — ABNORMAL LOW (ref >60)
Glucose, Bld: 123 mg/dL — ABNORMAL HIGH (ref 70–99)
Potassium: 5.1 mmol/L (ref 3.5–5.1)
Sodium: 139 mmol/L (ref 135–145)

## 2019-10-04 LAB — DIGOXIN LEVEL: Digoxin Level: 0.4 ng/mL — ABNORMAL LOW (ref 0.8–2.0)

## 2019-10-04 LAB — TSH: TSH: 1.232 u[IU]/mL (ref 0.350–4.500)

## 2019-10-04 MED ORDER — CARVEDILOL 6.25 MG PO TABS: 6.2500 mg | ORAL_TABLET | Freq: Two times a day (BID) | ORAL | 1 refills | Status: DC

## 2019-10-04 NOTE — Patient Instructions (Signed)
INCREASE Coreg to 6.25mg  (1 tab) twice a day  Labs today We will only contact you if something comes back abnormal or we need to make some changes. Otherwise no news is good news!  Your physician recommends that you schedule a follow-up appointment in: 3 months with Dr Aundra Dubin  Please call office at [phone] option 2 if you have any questions or concerns.    At the [facility], you and your health needs are our priority. As part of our continuing mission to provide you with exceptional heart care, we have created designated Provider Care Teams. These Care Teams include your primary Cardiologist (physician) and Advanced Practice Providers (APPs- Physician Assistants and Nurse Practitioners) who all work together to provide you with the care you need, when you need it.   You may see any of the following providers on your designated Care Team at your next follow up: Marland Kitchen Dr Glori Bickers . Dr [name] . Darrick Grinder, NP . Lyda Jester, PA . Audry Riles, [name]   Please be sure to bring in all your medications bottles to every appointment.

## 2019-10-04 NOTE — Progress Notes (Signed)
  Echocardiogram 2D Echocardiogram has been performed.  Melinda Mckinney 10/04/2019, 11:02 AM

## 2019-10-05 NOTE — Progress Notes (Signed)
PCP: Colon Branch, MD  HF Cardiology: Dr. Aundra Dubin  64 y.o. with CAD s/p PCI in 2010, chronic combined systolic and diastolic HF (EF [id number] in 2018), atrial fibrillation on chronic anticoagulation w/ Xarelto, HTN, and hypothyroidism was diagnosed w/ COVID-19 in Sep 2020 and treated at [place] infection c/b PNA. She was discharged and readmitted back to [facility] 10/9-10/13/20 for gastroenteritis, felt to be a sequela of COVID-19. She was managed w/ supportive care and discharged home. Had f/u with PCP 10/22 and endorsed worsening LLQ pain leading to abdominal CT which showed acute diverticulitis. She was started on outpatient antibiotics, cipro + flagyl but symptoms failed to improve, prompting her to seek emergency medical care at [facility]  Initial EKG in the ED showed afib w/ RVR for which general cardiology was consulted. She was started on IV dilt for rate control but later required treatment w/ IV amiodarone due to persistent rapid ventricular response. Hospital course c/b volume overload, requiring IV diuretics. 2D echo was obtained and showed worsening LVEF, now 25-30% w/ moderate MR. She underwent subsequent R/LHC in 11/20 which demonstrated widely patent coronaries. RHC showed elevated mean RA pressure at 22, PWP 43, LVEDP 45 and low CO. CI by Fick 1.6. She underwent TEE-guided DCCV, TEE showed EF 15% with moderate RV dysfunction and moderate MR.  She went back into rapid atrial fibrillation.  She failed additional cardioversions.  Despite amiodarone gtt, HR remained in the 140s-150s.  Finally, she underwent AV nodal ablation with BiV pacing due to inability to control atrial fibrillation. While in the hospital, she was on milrinone gtt for a period of time, but we were able to taper it off. She was discharged home after medication optimization.   Echo was done today and reviewed, EF up to 40-45%, mild diffuse hypokinesis, RV low normal function.   Patient presents for followup of  CHF.  She is doing quite well.  She does note some bendopnea but otherwise no dyspnea.  No chest pain.  No lightheadedness/palpitations.  No orthopnea/PND. Weight is stable.   Labs (11/20): digoxin level 1.4, creatinine 1.81, K 4.8, hgb 13.9 Labs (1/21): LDL 91, K 3.7, creatinine 1.32  PMH: 1. Hypothyroidism 2. Single kidney s/p nephrectomy 3. Atrial fibrillation: Initially diagnosed in 2018.  She was on sotalol at one point to maintain NSR.  - Uncontrollable atrial fibrillation during 11/20 admission, patient eventually had AV nodal ablation with CRT-P implantation.  4. H/o achalasia 5. GERD 6. Fibromyalgia 7. H/o diverticulitis in 10/20 8. COVID-19 in 9/20.  9. H/o cervical cancer: cryotherapy.  10. Degenerative disc disease.  11. Chronic systolic CHF: Nonischemic cardiomyopathy, possibly tachycardia-mediated in setting of uncontrolled atrial fibrillation. Medtronic CRT-P device.  - Echo (2018): EF 45-50%.  - Echo (11/20): EF 25-30%, mildly decreased RV systolic function, moderate MR.  - TEE (11/20): EF 15%, moderately decreased RV systolic function, moderate MR.  - Cardiac MRI (11/20): EF 34%, RV EF 30%, no LGE (done after AV nodal ablation and BiV pacing).  - RHC (11/20): mean RA 22, mean PCWP 43, LVEDP 45, CI 1.6 (Fick) and 2.7 (thermodilution).  - Echo (2/21): EF 40-45%, mild diffuse hypokinesis, low normal RV systolic function.  12. CAD: PCI in 2010.  - LHC (11/20): No significant coronary disease.   Social History   Socioeconomic History  . Marital status: Married    Spouse name: Not on file  . Number of children: 3  . Years of education: Not on file  .  Highest education level: Not on file  Occupational History  . 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 Smoking status: Former Smoker    Packs/day: 2.00    Years: 28.00    Pack years: 56.00    Types: Cigarettes    Quit date: 2000    Years since quitting: 21.1  . Smokeless tobacco: Never Used  Substance and Sexual  Activity  . Alcohol use: Yes    Comment: 07/27/2017 "3-4 drinks//month"  . Drug use: No  . Sexual activity: Not Currently    Comment: 1ST INTERCOURSE- 16, PARTNERS - 2  Other Topics Concern  . Not on file  Social History Narrative   Original from [place]   3 children, 2 alive   Household --pt and husband    Social Determinants of Radio broadcast assistant Strain:   . Difficulty of Paying Living Expenses: Not on file  Food Insecurity:   . Worried About Charity fundraiser in the Last Year: Not on file  . Ran Out of Food in the Last Year: Not on file  Transportation Needs:   . Lack of Transportation (Medical): Not on file  . Lack of Transportation (Non-Medical): Not on file  Physical Activity:   . Days of Exercise per Week: Not on file  . Minutes of Exercise per Session: Not on file  Stress:   . Feeling of Stress : Not on file  Social Connections:   . Frequency of Communication with Friends and Family: Not on file  . Frequency of Social Gatherings with Friends and Family: Not on file  . Attends Religious Services: Not on file  . Active Member of Clubs or Organizations: Not on file  . Attends Archivist Meetings: Not on file  . Marital Status: Not on file  Intimate Partner Violence:   . Fear of Current or Ex-Partner: Not on file  . Emotionally Abused: Not on file  . Physically Abused: Not on file  . Sexually Abused: Not on file   Family History  Problem Relation Age of Onset  . Breast cancer Other        aunt   . CAD Brother   . Lung cancer Mother        F and M  . Diabetes Father        F and mother   . CAD Father   . Lung cancer Father   . Stroke Sister   . Colon cancer Neg Hx    ROS: All systems reviewed and negative except as per HPI.   Current Outpatient Medications  Medication Sig Dispense Refill  . alendronate (FOSAMAX) 70 MG tablet Take 1 tablet (70 mg total) by mouth every 7 (seven) days. Take with a full glass of water on an empty  stomach. 12 tablet 3  . allopurinol (ZYLOPRIM) 100 MG tablet Take 1 tablet (100 mg total) by mouth daily. 90 tablet 1  . atorvastatin (LIPITOR) 40 MG tablet Take 1.5 tablets (60 mg total) by mouth at bedtime. 135 tablet 1  . carvedilol (COREG) 6.25 MG tablet Take 1 tablet (6.25 mg total) by mouth 2 (two) times daily with a meal. 180 tablet 1  . Cholecalciferol (VITAMIN D3) 5000 UNITS CAPS Take 5,000 Units by mouth daily.     . clobetasol cream (TEMOVATE) 0.05 %     . cyclobenzaprine (FLEXERIL) 10 MG tablet Take 1 tablet (10 mg total) by mouth 2 (two) times daily as needed for muscle  spasms. 60 tablet 1  . digoxin (LANOXIN) 0.125 MG tablet Take 0.5 tablets (0.0625 mg total) by mouth every other day. 8 tablet 3  . DULoxetine (CYMBALTA) 60 MG capsule Take 1 capsule (60 mg total) by mouth daily. 90 capsule 1  . levothyroxine (SYNTHROID) 137 MCG tablet Take 1 tablet (137 mcg total) by mouth daily before breakfast. 45 tablet 3  . metFORMIN (GLUCOPHAGE) 500 MG tablet Take 1 tablet (500 mg total) by mouth 2 (two) times daily with a meal. 180 tablet 1  . omeprazole (PRILOSEC) 20 MG capsule Take 1 capsule (20 mg total) by mouth 2 (two) times daily before a meal. 180 capsule 3  . rivaroxaban (XARELTO) 20 MG TABS tablet Take 1 tablet (20 mg total) by mouth daily with supper. 90 tablet 3  . sacubitril-valsartan (ENTRESTO) 24-26 MG Take 1 tablet by mouth 2 (two) times daily. 60 tablet 3  . spironolactone (ALDACTONE) 25 MG tablet Take 1 tablet (25 mg total) by mouth daily. 30 tablet 11  . torsemide (DEMADEX) 20 MG tablet TAKE ONE TABLET BY MOUTH DAILY 90 tablet 3  . zolpidem (AMBIEN) 5 MG tablet Take 1 tablet (5 mg total) by mouth at bedtime as needed. for sleep 90 tablet 1   No current facility-administered medications for this encounter.   BP 110/80   Pulse 70   Wt 101.5 kg (223 lb 12.8 oz)   SpO2 99%   BMI 38.42 kg/m  General: NAD Neck: No JVD, no thyromegaly or thyroid nodule.  Lungs: Clear to  auscultation bilaterally with normal respiratory effort. CV: Nondisplaced PMI.  Heart regular S1/S2, no S3/S4, no murmur.  No peripheral edema.  No carotid bruit.  Normal pedal pulses.  Abdomen: Soft, nontender, no hepatosplenomegaly, no distention.  Skin: Intact without lesions or rashes.  Neurologic: Alert and oriented x 3.  Psych: Normal affect. Extremities: No clubbing or cyanosis.  HEENT: Normal.   Assessment/Plan: 1. Chronic systolic CHF: Nonischemic cardiomyopathy. Echo in 2018 with EF 45-50%. Echo in 11/20 with EF 25-30%, diffuse hypokinesis, mildly decreased RV systolic function, moderate MR. LHC/RHC in 11/20 with no significant coronary disease, markedly elevated filling pressures and low cardiac output (CI 1.6). 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 Cardiac MRI 11/20 showed LVEF 34%, RVEF 30%, no LGE => perhaps pointing to tachy-mediated CMP as more likely etiology.  Initially required inotropic support w/ milrinone but able to titrate off.  Now s/p AV nodal ablation with MDT CRT-P.  Doing well symptomatically since discharge with NYHA class II symptoms.  Echo done today and reviewed, EF up to 40-45%.  Volume status looks ok.  - Continue torsemide 20 mg daily. BMET today.  - Continue Entresto 24/26 bid.    - Continue spironolactone to 25 daily.  - Continue digoxin 0.0625 mg daily.  Check digoxin level.  - Increase Coreg to 6.25 mg bid.   2. Atrial fibrillation: Persistent atrial fibrillation, it appears, at least since 9/20 when she was admitted with COVID-19. Prior, she had paroxysmal atrial fibrillation and was maintained on sotalol. She is now off sotalol.  Unable to cardiovert or rate control atrial fibrillation. Therefore, she had AV nodal ablation with BiV pacing.  - Now off amiodarone.   - Continue Xarelto 20 mg daily.  3. Mitral regurgitation: Functional MR,  moderate on TEE.  Rate was controlled on cardiac MRI, MR was less impressive.  Minimal  MR on echo done today.  4. CKD stage 3: Creatinine stable at 1.3 today.    Followup in 3 months.   [name] 10/05/2019

## 2019-10-23 ENCOUNTER — Encounter: Payer: Self-pay | Admitting: Internal Medicine

## 2019-10-23 ENCOUNTER — Telehealth (INDEPENDENT_AMBULATORY_CARE_PROVIDER_SITE_OTHER): Payer: Medicare Other | Admitting: Internal Medicine

## 2019-10-23 DIAGNOSIS — Z9861 Coronary angioplasty status: Secondary | ICD-10-CM

## 2019-10-23 DIAGNOSIS — I442 Atrioventricular block, complete: Secondary | ICD-10-CM | POA: Diagnosis not present

## 2019-10-23 DIAGNOSIS — I10 Essential (primary) hypertension: Secondary | ICD-10-CM

## 2019-10-23 DIAGNOSIS — I4821 Permanent atrial fibrillation: Secondary | ICD-10-CM | POA: Diagnosis not present

## 2019-10-23 DIAGNOSIS — I5022 Chronic systolic (congestive) heart failure: Secondary | ICD-10-CM

## 2019-10-23 DIAGNOSIS — I2510 Atherosclerotic heart disease of native coronary artery without angina pectoris: Secondary | ICD-10-CM

## 2019-10-23 DIAGNOSIS — D6869 Other thrombophilia: Secondary | ICD-10-CM

## 2019-10-23 DIAGNOSIS — I428 Other cardiomyopathies: Secondary | ICD-10-CM

## 2019-10-23 NOTE — Progress Notes (Signed)
Electrophysiology TeleHealth Note   Due to national recommendations of social distancing due to COVID 19, an audio/video telehealth visit is felt to be most appropriate for this patient at this time.  See MyChart message from today for the patient's consent to telehealth for [facility].  Date:  10/23/2019   ID:  Melinda Mckinney, DOB 08/06/1956, MRN [id number]  Location: patient's home  Provider location:  [facility]  Evaluation Performed: Follow-up visit  PCP:  Colon Branch, MD   Electrophysiologist:  Dr Rayann Heman  Chief Complaint:  palpitations  History of Present Illness:    Melinda Mckinney is a 64 y.o. female who presents via telehealth conferencing today.  Since her AV nodal ablation and CRT-P implant in November, the patient reports doing very well.  Her energy is much improved.  CHF has nearly resolved.  She is active.  Today, she denies symptoms of palpitations, chest pain, shortness of breath,  lower extremity edema, dizziness, presyncope, or syncope.  The patient is otherwise without complaint today.  The patient denies symptoms of fevers, chills, cough, or new SOB worrisome for COVID 19.  Past Medical History:  Diagnosis Date  . Anemia   . Anxiety and depression   . Asthma   . Atrial fibrillation ([place]) 07/2017  . Borderline diabetes   . CAD (coronary artery disease) ~2009   stent   . Cervical cancer ([place])    "stage 1; had cryotherapy"  . Chronic lower back pain   . DDD (degenerative disc disease), lumbar    Dr. Nelva Bush, recommends lumbar epidural steroid injections L5-S1 to the right  . Depression   . Fibromyalgia   . GERD (gastroesophageal reflux disease)   . High cholesterol   . History of hiatal hernia   . History of kidney stones   . Hypertension   . Hypothyroidism   . Osteoporosis   . Thyroid disease     Past Surgical History:  Procedure Laterality Date  . AV NODE ABLATION N/A 06/24/2019   Procedure: AV NODE ABLATION;  Surgeon: Evans Lance, MD;   Location: [place] CV LAB;  Service: Cardiovascular;  Laterality: N/A;  . BIV PACEMAKER INSERTION CRT-P N/A 06/24/2019   Procedure: BIV PACEMAKER INSERTION CRT-P;  Surgeon: Evans Lance, MD;  Location: [place] CV LAB;  Service: Cardiovascular;  Laterality: N/A;  . CARDIAC CATHETERIZATION  ~ 2008  . CARDIOVERSION N/A 08/05/2017   Procedure: CARDIOVERSION;  Surgeon: Evans Lance, MD;  Location: [place];  Service: Cardiovascular;  Laterality: N/A;  . CARDIOVERSION N/A 08/24/2017   Procedure: CARDIOVERSION;  Surgeon: Sanda Klein, MD;  Location: [place] ENDOSCOPY;  Service: Cardiovascular;  Laterality: N/A;  . CARDIOVERSION N/A 06/19/2019   Procedure: CARDIOVERSION;  Surgeon: Larey Dresser, MD;  Location: [place];  Service: Cardiovascular;  Laterality: N/A;  . CARDIOVERSION N/A 06/21/2019   Procedure: CARDIOVERSION;  Surgeon: Larey Dresser, MD;  Location: [facility] ENDOSCOPY;  Service: Cardiovascular;  Laterality: N/A;  . [place]; ~ 1978/1979; 1984  . ESOPHAGOMYOTOMY  2009  . GYNECOLOGIC CRYOSURGERY  X 2   "stage 1 cancer"  . HERNIA REPAIR  X 6   "all in my stomach" (07/27/2017)  . KNEE ARTHROSCOPY Right   . NEPHRECTOMY Right 1991   in [place], due to fibrosis and lithiasis  . RIGHT/LEFT HEART CATH AND CORONARY ANGIOGRAPHY N/A 06/17/2019   Procedure: RIGHT/LEFT HEART CATH AND CORONARY ANGIOGRAPHY;  Surgeon: Lorretta Harp, MD;  Location: [place] CV  LAB;  Service: Cardiovascular;  Laterality: N/A;  . TEE WITHOUT CARDIOVERSION N/A 06/19/2019   Procedure: TRANSESOPHAGEAL ECHOCARDIOGRAM (TEE);  Surgeon: Larey Dresser, MD;  Location: [facility] OR;  Service: Cardiovascular;  Laterality: N/A;  . TUBAL LIGATION      Current Outpatient Medications  Medication Sig Dispense Refill  . alendronate (FOSAMAX) 70 MG tablet Take 1 tablet (70 mg total) by mouth every 7 (seven) days. Take with a full glass of water on an empty stomach. 12 tablet 3  . allopurinol (ZYLOPRIM) 100 MG tablet  Take 1 tablet (100 mg total) by mouth daily. 90 tablet 1  . atorvastatin (LIPITOR) 40 MG tablet Take 1.5 tablets (60 mg total) by mouth at bedtime. 135 tablet 1  . carvedilol (COREG) 6.25 MG tablet Take 1 tablet (6.25 mg total) by mouth 2 (two) times daily with a meal. 180 tablet 1  . Cholecalciferol (VITAMIN D3) 5000 UNITS CAPS Take 5,000 Units by mouth daily.     . clobetasol cream (TEMOVATE) 0.05 %     . cyclobenzaprine (FLEXERIL) 10 MG tablet Take 1 tablet (10 mg total) by mouth 2 (two) times daily as needed for muscle spasms. 60 tablet 1  . digoxin (LANOXIN) 0.125 MG tablet Take 0.5 tablets (0.0625 mg total) by mouth every other day. 8 tablet 3  . DULoxetine (CYMBALTA) 60 MG capsule Take 1 capsule (60 mg total) by mouth daily. 90 capsule 1  . levothyroxine (SYNTHROID) 137 MCG tablet Take 1 tablet (137 mcg total) by mouth daily before breakfast. 45 tablet 3  . metFORMIN (GLUCOPHAGE) 500 MG tablet Take 1 tablet (500 mg total) by mouth 2 (two) times daily with a meal. 180 tablet 1  . omeprazole (PRILOSEC) 20 MG capsule Take 1 capsule (20 mg total) by mouth 2 (two) times daily before a meal. 180 capsule 3  . rivaroxaban (XARELTO) 20 MG TABS tablet Take 1 tablet (20 mg total) by mouth daily with supper. 90 tablet 3  . sacubitril-valsartan (ENTRESTO) 24-26 MG Take 1 tablet by mouth 2 (two) times daily. 60 tablet 3  . spironolactone (ALDACTONE) 25 MG tablet Take 1 tablet (25 mg total) by mouth daily. 30 tablet 11  . torsemide (DEMADEX) 20 MG tablet TAKE ONE TABLET BY MOUTH DAILY 90 tablet 3  . zolpidem (AMBIEN) 5 MG tablet Take 1 tablet (5 mg total) by mouth at bedtime as needed. for sleep 90 tablet 1   No current facility-administered medications for this visit.    Allergies:   Penicillins   Social History:  The patient  reports that she quit smoking about 21 years ago. Her smoking use included cigarettes. She has a 56.00 pack-year smoking history. She has never used smokeless tobacco. She  reports current alcohol use. She reports that she does not use drugs.   ROS:  Please see the history of present illness.   All other systems are personally reviewed and negative.    Exam:    Vital Signs:  There were no vitals taken for this visit.  Well sounding and appearing, alert and conversant, regular work of breathing,  good skin color Eyes- anicteric, neuro- grossly intact, skin- no apparent rash or lesions or cyanosis, mouth- oral mucosa is pink  Labs/Other Tests and Data Reviewed:    Recent Labs: 06/22/2019: Magnesium 3.2 06/28/2019: ALT 6 07/24/2019: B Natriuretic Peptide 58.4 08/14/2019: Hemoglobin 12.7; Platelets 309 10/04/2019: BUN 26; Creatinine, Ser 1.37; Potassium 5.1; Sodium 139; TSH 1.232   Wt Readings from Last 3 Encounters:  10/04/19 223 lb 12.8 oz (101.5 kg)  08/23/19 221 lb 3.2 oz (100.3 kg)  08/22/19 222 lb (100.7 kg)     Last device remote is reviewed from [place] PDF which reveals normal device function, persistently in afib but 100% BiV paced,  Outputs are at acute setting.   ASSESSMENT & PLAN:    1. Complete heart block S/p AV nodal ablation by Dr Lovena Le  Doing well Activity by device has improved substantially Bring back into the office in 3 months for reduction of lower rate and to make sure that rate response is adequate.  Will need to program at chronic output settings then if she does not auto adjust. Remotes are up to date  2. Permanent afib Doing well s/p AV nodl ablation chads2vasc score is at least 5.  She is on xarelto for stroke prevention She is on digoxin.  Dig level 10/04/19 is reviewed She will need close follow-up to avoid toxicity with this medicine  3. Nonischemic CM EF has improved with AV nodal ablation.  I would anticipate further recovery.  Followed in CHF clinic. Enroll in ICM device clinic Very narrow QRS on review of recent ekg (personally reviewed)  4. HTN Stable No change required today  5. CAD No ischemic  symptoms No changes today  Follow-up:  Return to see me in office in 3 months for device adjustement   Patient Risk:  after full review of this patients clinical status, I feel that they are at high risk at this time.  Today, I have spent 15 minutes with the patient with telehealth technology discussing arrhythmia management .    Army Fossa, MD  10/23/2019 8:46 AM     [facility] [address] [place] [address] [phone] (office) [phone] (fax)

## 2019-10-27 DIAGNOSIS — K529 Noninfective gastroenteritis and colitis, unspecified: Secondary | ICD-10-CM | POA: Diagnosis not present

## 2019-10-27 DIAGNOSIS — R531 Weakness: Secondary | ICD-10-CM | POA: Diagnosis not present

## 2019-11-27 DIAGNOSIS — K529 Noninfective gastroenteritis and colitis, unspecified: Secondary | ICD-10-CM | POA: Diagnosis not present

## 2019-11-27 DIAGNOSIS — R531 Weakness: Secondary | ICD-10-CM | POA: Diagnosis not present

## 2019-12-04 ENCOUNTER — Telehealth: Payer: Self-pay

## 2019-12-04 NOTE — Telephone Encounter (Signed)
Referred to [facility] by Dr Rayann Heman.  Attempted ICM intro call to patient and left message with ICM number for call back.

## 2019-12-04 NOTE — Telephone Encounter (Signed)
-----   Message from Thompson Grayer, MD sent at 10/23/2019  8:54 AM EST ----- Please enroll in [facility] device clinic

## 2019-12-18 NOTE — Telephone Encounter (Signed)
Unable to reach patient for ICM enrollment.  

## 2019-12-23 ENCOUNTER — Ambulatory Visit (INDEPENDENT_AMBULATORY_CARE_PROVIDER_SITE_OTHER): Payer: Medicare Other | Admitting: *Deleted

## 2019-12-23 DIAGNOSIS — I4891 Unspecified atrial fibrillation: Secondary | ICD-10-CM | POA: Diagnosis not present

## 2019-12-23 LAB — CUP PACEART REMOTE DEVICE CHECK
Battery Remaining Longevity: 79 mo
Battery Voltage: 3.01 V
Brady Statistic AP VP Percent: 0 %
Brady Statistic AP VS Percent: 0 %
Brady Statistic AS VP Percent: 0 %
Brady Statistic AS VS Percent: 0 %
Brady Statistic RA Percent Paced: 0 %
Brady Statistic RV Percent Paced: 99.98 %
Date Time Interrogation Session: 20210509231847
Implantable Lead Implant Date: 20201109
Implantable Lead Implant Date: 20201109
Implantable Lead Implant Date: 20201109
Implantable Lead Location: 753858
Implantable Lead Location: 753859
Implantable Lead Location: 753860
Implantable Lead Model: 4598
Implantable Lead Model: 5076
Implantable Lead Model: 5076
Implantable Pulse Generator Implant Date: 20201109
Lead Channel Impedance Value: 1007 Ohm
Lead Channel Impedance Value: 1178 Ohm
Lead Channel Impedance Value: 1235 Ohm
Lead Channel Impedance Value: 342 Ohm
Lead Channel Impedance Value: 418 Ohm
Lead Channel Impedance Value: 456 Ohm
Lead Channel Impedance Value: 494 Ohm
Lead Channel Impedance Value: 551 Ohm
Lead Channel Impedance Value: 627 Ohm
Lead Channel Impedance Value: 665 Ohm
Lead Channel Impedance Value: 665 Ohm
Lead Channel Impedance Value: 836 Ohm
Lead Channel Impedance Value: 931 Ohm
Lead Channel Impedance Value: 950 Ohm
Lead Channel Pacing Threshold Amplitude: 0.375 V
Lead Channel Pacing Threshold Pulse Width: 0.4 ms
Lead Channel Sensing Intrinsic Amplitude: 0.875 mV
Lead Channel Sensing Intrinsic Amplitude: 0.875 mV
Lead Channel Sensing Intrinsic Amplitude: 6.25 mV
Lead Channel Sensing Intrinsic Amplitude: 6.25 mV
Lead Channel Setting Pacing Amplitude: 2.5 V
Lead Channel Setting Pacing Amplitude: 3.5 V
Lead Channel Setting Pacing Pulse Width: 0.4 ms
Lead Channel Setting Pacing Pulse Width: 0.8 ms
Lead Channel Setting Sensing Sensitivity: 2 mV

## 2019-12-23 NOTE — Progress Notes (Signed)
Remote pacemaker transmission.   

## 2019-12-24 ENCOUNTER — Other Ambulatory Visit: Payer: Self-pay | Admitting: Cardiology

## 2019-12-25 ENCOUNTER — Other Ambulatory Visit (HOSPITAL_COMMUNITY): Payer: Self-pay | Admitting: Cardiology

## 2019-12-27 DIAGNOSIS — K529 Noninfective gastroenteritis and colitis, unspecified: Secondary | ICD-10-CM | POA: Diagnosis not present

## 2019-12-27 DIAGNOSIS — R531 Weakness: Secondary | ICD-10-CM | POA: Diagnosis not present

## 2019-12-31 ENCOUNTER — Other Ambulatory Visit: Payer: Self-pay | Admitting: Internal Medicine

## 2020-01-03 ENCOUNTER — Encounter (HOSPITAL_COMMUNITY): Payer: Self-pay | Admitting: Cardiology

## 2020-01-03 ENCOUNTER — Other Ambulatory Visit: Payer: Self-pay

## 2020-01-03 ENCOUNTER — Ambulatory Visit (HOSPITAL_COMMUNITY)
Admission: RE | Admit: 2020-01-03 | Discharge: 2020-01-03 | Disposition: A | Discharge status: Home / Self Care | Payer: Medicare Other | Source: Ambulatory Visit | Attending: Cardiology | Admitting: Cardiology

## 2020-01-03 VITALS — BP 122/66 | HR 70 | Ht 64.0 in | Wt 225.6 lb

## 2020-01-03 DIAGNOSIS — Z87891 Personal history of nicotine dependence: Secondary | ICD-10-CM | POA: Diagnosis not present

## 2020-01-03 DIAGNOSIS — Z8249 Family history of ischemic heart disease and other diseases of the circulatory system: Secondary | ICD-10-CM | POA: Insufficient documentation

## 2020-01-03 DIAGNOSIS — Z905 Acquired absence of kidney: Secondary | ICD-10-CM | POA: Diagnosis not present

## 2020-01-03 DIAGNOSIS — Z8541 Personal history of malignant neoplasm of cervix uteri: Secondary | ICD-10-CM | POA: Diagnosis not present

## 2020-01-03 DIAGNOSIS — R0683 Snoring: Secondary | ICD-10-CM | POA: Diagnosis not present

## 2020-01-03 DIAGNOSIS — Z79890 Hormone replacement therapy (postmenopausal): Secondary | ICD-10-CM | POA: Insufficient documentation

## 2020-01-03 DIAGNOSIS — I4821 Permanent atrial fibrillation: Secondary | ICD-10-CM

## 2020-01-03 DIAGNOSIS — Z7901 Long term (current) use of anticoagulants: Secondary | ICD-10-CM | POA: Insufficient documentation

## 2020-01-03 DIAGNOSIS — I428 Other cardiomyopathies: Secondary | ICD-10-CM | POA: Insufficient documentation

## 2020-01-03 DIAGNOSIS — I340 Nonrheumatic mitral (valve) insufficiency: Secondary | ICD-10-CM | POA: Insufficient documentation

## 2020-01-03 DIAGNOSIS — Z7984 Long term (current) use of oral hypoglycemic drugs: Secondary | ICD-10-CM | POA: Diagnosis not present

## 2020-01-03 DIAGNOSIS — Z79899 Other long term (current) drug therapy: Secondary | ICD-10-CM | POA: Insufficient documentation

## 2020-01-03 DIAGNOSIS — I5022 Chronic systolic (congestive) heart failure: Secondary | ICD-10-CM | POA: Diagnosis not present

## 2020-01-03 DIAGNOSIS — Z8616 Personal history of COVID-19: Secondary | ICD-10-CM | POA: Insufficient documentation

## 2020-01-03 DIAGNOSIS — K219 Gastro-esophageal reflux disease without esophagitis: Secondary | ICD-10-CM | POA: Insufficient documentation

## 2020-01-03 DIAGNOSIS — I4819 Other persistent atrial fibrillation: Secondary | ICD-10-CM | POA: Diagnosis not present

## 2020-01-03 DIAGNOSIS — E785 Hyperlipidemia, unspecified: Secondary | ICD-10-CM

## 2020-01-03 DIAGNOSIS — E039 Hypothyroidism, unspecified: Secondary | ICD-10-CM | POA: Diagnosis not present

## 2020-01-03 DIAGNOSIS — M797 Fibromyalgia: Secondary | ICD-10-CM | POA: Insufficient documentation

## 2020-01-03 DIAGNOSIS — Z833 Family history of diabetes mellitus: Secondary | ICD-10-CM | POA: Insufficient documentation

## 2020-01-03 DIAGNOSIS — I2510 Atherosclerotic heart disease of native coronary artery without angina pectoris: Secondary | ICD-10-CM | POA: Diagnosis not present

## 2020-01-03 DIAGNOSIS — I38 Endocarditis, valve unspecified: Secondary | ICD-10-CM | POA: Diagnosis not present

## 2020-01-03 DIAGNOSIS — N183 Chronic kidney disease, stage 3 unspecified: Secondary | ICD-10-CM | POA: Diagnosis not present

## 2020-01-03 LAB — BASIC METABOLIC PANEL
Anion gap: 12 (ref 5–15)
BUN: 25 mg/dL — ABNORMAL HIGH (ref 8–23)
CO2: 25 mmol/L (ref 22–32)
Calcium: 10 mg/dL (ref 8.9–10.3)
Chloride: 103 mmol/L (ref 98–111)
Creatinine, Ser: 1.44 mg/dL — ABNORMAL HIGH (ref 0.44–1.00)
GFR calc Af Amer: 44 mL/min — ABNORMAL LOW (ref >60)
GFR calc non Af Amer: 38 mL/min — ABNORMAL LOW (ref >60)
Glucose, Bld: 170 mg/dL — ABNORMAL HIGH (ref 70–99)
Potassium: 4.8 mmol/L (ref 3.5–5.1)
Sodium: 140 mmol/L (ref 135–145)

## 2020-01-03 LAB — LIPID PANEL
Cholesterol: 174 mg/dL (ref 0–200)
HDL: 71 mg/dL (ref >40)
LDL Cholesterol: 68 mg/dL (ref 0–99)
Total CHOL/HDL Ratio: 2.5 RATIO
Triglycerides: 175 mg/dL — ABNORMAL HIGH (ref <150)
VLDL: 35 mg/dL (ref 0–40)

## 2020-01-03 LAB — DIGOXIN LEVEL: Digoxin Level: 0.3 ng/mL — ABNORMAL LOW (ref 0.8–2.0)

## 2020-01-03 MED ORDER — TORSEMIDE 20 MG PO TABS: 20.0000 mg | ORAL_TABLET | ORAL | 3 refills | Status: DC

## 2020-01-03 MED ORDER — ENTRESTO 49-51 MG PO TABS: 1.0000 | ORAL_TABLET | Freq: Two times a day (BID) | ORAL | 3 refills | Status: DC

## 2020-01-03 NOTE — Patient Instructions (Addendum)
Labs done today. We will contact you only if your labs are abnormal.  DECREASE Torsemide 20mg  (1 tablet) by mouth every other day.  INCREASE Entresto 49-51mg (1 tablet) by mouth 2 times daily.  No other medication changes were made. Please continue all other current medications as prescribed.  Your physician recommends that you schedule a follow-up appointment in: 10 days for a lab only appointment and in 3 months for an appointment with Dr. Aundra Dubin.  Your provider has recommended that you have a home sleep study.  [employer] is the company that does these test.  They will contact you by phone and must speak with you before they can ship the equipment.  Once they have spoken with you they will send the equipment right to your home with instructions on how to set it up.  Once you have completed the test simply box all the equipment back up and mail back to the company.  IF you have any questions or issues with the equipment please call the company directly at [phone].  If your test is positive for sleep apnea and you need a home CPAP machine you will be contacted by Dr Theodosia Blender office [facility]) to set this up.  At the [facility], you and your health needs are our priority. As part of our continuing mission to provide you with exceptional heart care, we have created designated Provider Care Teams. These Care Teams include your primary Cardiologist (physician) and Advanced Practice Providers (APPs- Physician Assistants and Nurse Practitioners) who all work together to provide you with the care you need, when you need it.   You may see any of the following providers on your designated Care Team at your next follow up: Marland Kitchen Dr Glori Bickers . Dr [name] . Darrick Grinder, NP . Lyda Jester, PA . Audry Riles, [name]   Please be sure to bring in all your medications bottles to every appointment.

## 2020-01-03 NOTE — Progress Notes (Signed)
Patient Name: Melinda Mckinney        DOB:  1956/04/16      Height: [place]  Office Name: [facility]         Referring Provider: Dr. [name]  Today's Date: 01/03/20 WP300   STOP BANG RISK ASSESSMENT S (snore) Have you been told that you snore?     YES   T (tired) Are you often tired, fatigued, or sleepy during the day?   YES  O (obstruction) Do you stop breathing, choke, or gasp during sleep? NO   P (pressure) Do you have or are you being treated for high blood pressure? YES   B (BMI) Is your body index greater than 35 kg/m? YES   A (age) Are you 66 years old or older? YES   N (neck) Do you have a neck circumference greater than 16 inches?   YES/NO   G (gender) Are you a female? NO   TOTAL STOP/BANG "YES" ANSWERS 5                                                                       For Office Use Only              Procedure Order Form    YES to 3+ Stop Bang questions OR two clinical symptoms - patient qualifies for WatchPAT (CPT 95800)     Submit: This Form + Patient Face Sheet + Clinical Note via Cloud[employer] or Fax: [phone]         Clinical Notes: Will consult Sleep Specialist and refer for management of therapy due to patient increased risk of Sleep Apnea. Ordering a sleep study due to the following two clinical symptoms: Excessive daytime sleepiness G47.10 / Gastroesophageal reflux K21.9 / Nocturia R35.1 / Morning Headaches G44.221 / Difficulty concentrating R41.840 / Memory problems or poor judgment G31.84 / Personality changes or irritability R45.4 / Loud snoring R06.83 / Depression F32.9 / Unrefreshed by sleep G47.8 / Impotence N52.9 / History of high blood pressure R03.0 / Insomnia G47.00    I understand that I am proceeding with a home sleep apnea test as ordered by my treating physician. I understand that untreated sleep apnea is a serious cardiovascular risk factor and it is my responsibility to perform the test and seek  management for sleep apnea. I will be contacted with the results and be managed for sleep apnea by a local sleep physician. I will be receiving equipment and further instructions from [facility] I shall promptly ship back the equipment via the included mailing label. I understand my insurance will be billed for the test and as the patient I am responsible for any insurance related out-of-pocket costs incurred. I have been provided with written instructions and can call for additional video or telephonic instruction, with 24-hour availability of qualified personnel to answer any questions: Patient Help Desk [phone].  Patient Signature ______________________________________________________   Date______________________ Patient Telemedicine Verbal Consent

## 2020-01-05 NOTE — Progress Notes (Signed)
PCP: Colon Branch, MD  [facility] Cardiology: Dr. Aundra Dubin  64 y.o. with CAD s/p PCI in 2010, chronic combined systolic and diastolic HF (EF [id number] in 2018), atrial fibrillation on chronic anticoagulation w/ Xarelto, HTN, and hypothyroidism was diagnosed w/ COVID-19 in Sep 2020 and treated at [place] infection c/b PNA. She was discharged and readmitted back to [facility] 10/9-10/13/20 for gastroenteritis, felt to be a sequela of COVID-19. She was managed w/ supportive care and discharged home. Had f/u with PCP 10/22 and endorsed worsening LLQ pain leading to abdominal CT which showed acute diverticulitis. She was started on outpatient antibiotics, cipro + flagyl but symptoms failed to improve, prompting her to seek emergency medical care at [facility]  Initial EKG in the ED showed afib w/ RVR for which general cardiology was consulted. She was started on IV dilt for rate control but later required treatment w/ IV amiodarone due to persistent rapid ventricular response. Hospital course c/b volume overload, requiring IV diuretics. 2D echo was obtained and showed worsening LVEF, now 25-30% w/ moderate MR. She underwent subsequent R/LHC in 11/20 which demonstrated widely patent coronaries. RHC showed elevated mean RA pressure at 22, PWP 43, LVEDP 45 and low CO. CI by Fick 1.6. She underwent TEE-guided DCCV, TEE showed EF 15% with moderate RV dysfunction and moderate MR.  She went back into rapid atrial fibrillation.  She failed additional cardioversions.  Despite amiodarone gtt, HR remained in the 140s-150s.  Finally, she underwent AV nodal ablation with BiV pacing due to inability to control atrial fibrillation. While in the hospital, she was on milrinone gtt for a period of time, but we were able to taper it off. She was discharged home after medication optimization.   Echo in 2/21 showed EF up to 40-45%, mild diffuse hypokinesis, RV low normal function.   Patient presents for followup of CHF.  Weight is  stable.  She has some generalized fatigue, sleepy during the day and snores at night.  No chest pain.  No lightheadedness.  No orthopnea/PND.  She has minimal exertional dyspnea but is still not very active.    Labs (11/20): digoxin level 1.4, creatinine 1.81, K 4.8, hgb 13.9 Labs (1/21): LDL 91, K 3.7, creatinine 1.32 Labs (2/21): K 5.1, creatinine 1.37, TSH normal, digoxin 0.4  Medtronic device interrogation: stable thoracic impedance, chronic atrial fibrillation with BiV pacing, no VT.   PMH: 1. Hypothyroidism 2. Single kidney s/p nephrectomy 3. Atrial fibrillation: Initially diagnosed in 2018.  She was on sotalol at one point to maintain NSR.  - Uncontrollable atrial fibrillation during 11/20 admission, patient eventually had AV nodal ablation with CRT-P implantation.  4. H/o achalasia 5. GERD 6. Fibromyalgia 7. H/o diverticulitis in 10/20 8. COVID-19 in 9/20.  9. H/o cervical cancer: cryotherapy.  10. Degenerative disc disease.  11. Chronic systolic CHF: Nonischemic cardiomyopathy, possibly tachycardia-mediated in setting of uncontrolled atrial fibrillation. Medtronic CRT-P device.  - Echo (2018): EF 45-50%.  - Echo (11/20): EF 25-30%, mildly decreased RV systolic function, moderate MR.  - TEE (11/20): EF 15%, moderately decreased RV systolic function, moderate MR.  - Cardiac MRI (11/20): EF 34%, RV EF 30%, no LGE (done after AV nodal ablation and BiV pacing).  - RHC (11/20): mean RA 22, mean PCWP 43, LVEDP 45, CI 1.6 (Fick) and 2.7 (thermodilution).  - Echo (2/21): EF 40-45%, mild diffuse hypokinesis, low normal RV systolic function.  12. CAD: PCI in 2010.  - LHC (11/20): No significant coronary disease.  Social History   Socioeconomic History  . Marital status: Married    Spouse name: Not on file  . Number of children: 3  . Years of education: Not on file  . Highest education level: Not on file  Occupational History  . 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 Smoking  status: Former Smoker    Packs/day: 2.00    Years: 28.00    Pack years: 56.00    Types: Cigarettes    Quit date: 2000    Years since quitting: 21.4  . Smokeless tobacco: Never Used  Substance and Sexual Activity  . Alcohol use: Yes    Alcohol/week: 5.0 standard drinks    Types: 5 Cans of beer per week    Comment: 07/27/2017 "3-4 drinks//month"  . Drug use: No  . Sexual activity: Not Currently    Comment: 1ST INTERCOURSE- 16, PARTNERS - 2  Other Topics Concern  . Not on file  Social History Narrative   Original from [place]   3 children, 2 alive   Household --pt and husband    Social Determinants of Radio broadcast assistant Strain:   . Difficulty of Paying Living Expenses:   Food Insecurity:   . Worried About Charity fundraiser in the Last Year:   . Arboriculturist in the Last Year:   Transportation Needs:   . Film/video editor (Medical):   Marland Kitchen Lack of Transportation (Non-Medical):   Physical Activity:   . Days of Exercise per Week:   . Minutes of Exercise per Session:   Stress:   . Feeling of Stress :   Social Connections:   . Frequency of Communication with Friends and Family:   . Frequency of Social Gatherings with Friends and Family:   . Attends Religious Services:   . Active Member of Clubs or Organizations:   . Attends Archivist Meetings:   Marland Kitchen Marital Status:   Intimate Partner Violence:   . Fear of Current or Ex-Partner:   . Emotionally Abused:   Marland Kitchen Physically Abused:   . Sexually Abused:    Family History  Problem Relation Age of Onset  . Breast cancer Other        aunt   . CAD Brother   . Lung cancer Mother        F and M  . Diabetes Father        F and mother   . CAD Father   . Lung cancer Father   . Stroke Sister   . Colon cancer Neg Hx    ROS: All systems reviewed and negative except as per HPI.   Current Outpatient Medications  Medication Sig Dispense Refill  . alendronate (FOSAMAX) 70 MG tablet Take 1 tablet (70 mg  total) by mouth every 7 (seven) days. Take with a full glass of water on an empty stomach. 12 tablet 3  . allopurinol (ZYLOPRIM) 100 MG tablet Take 1 tablet (100 mg total) by mouth daily. 90 tablet 1  . atorvastatin (LIPITOR) 40 MG tablet Take 1.5 tablets (60 mg total) by mouth at bedtime. 135 tablet 1  . carvedilol (COREG) 6.25 MG tablet Take 1 tablet (6.25 mg total) by mouth 2 (two) times daily with a meal. 180 tablet 1  . Cholecalciferol (VITAMIN D3) 5000 UNITS CAPS Take 5,000 Units by mouth daily.     . clobetasol cream (TEMOVATE) 0.05 %     . cyclobenzaprine (FLEXERIL) 10 MG tablet  Take 1 tablet (10 mg total) by mouth 2 (two) times daily as needed for muscle spasms. 60 tablet 1  . digoxin (LANOXIN) 0.125 MG tablet Take 0.5 tablets (0.0625 mg total) by mouth every other day. 22 tablet 3  . DULoxetine (CYMBALTA) 60 MG capsule Take 1 capsule (60 mg total) by mouth daily. 90 capsule 1  . levothyroxine (SYNTHROID) 137 MCG tablet Take 1 tablet (137 mcg total) by mouth daily before breakfast. 45 tablet 3  . metFORMIN (GLUCOPHAGE) 500 MG tablet Take 1 tablet (500 mg total) by mouth 2 (two) times daily with a meal. 180 tablet 0  . omeprazole (PRILOSEC) 20 MG capsule Take 1 capsule (20 mg total) by mouth 2 (two) times daily before a meal. 180 capsule 3  . rivaroxaban (XARELTO) 20 MG TABS tablet Take 1 tablet (20 mg total) by mouth daily with supper. 90 tablet 3  . spironolactone (ALDACTONE) 25 MG tablet Take 1 tablet (25 mg total) by mouth daily. 30 tablet 11  . torsemide (DEMADEX) 20 MG tablet Take 1 tablet (20 mg total) by mouth every other day. 45 tablet 3  . zolpidem (AMBIEN) 5 MG tablet Take 1 tablet (5 mg total) by mouth at bedtime as needed. for sleep 90 tablet 1  . sacubitril-valsartan (ENTRESTO) 49-51 MG Take 1 tablet by mouth 2 (two) times daily. 180 tablet 3   No current facility-administered medications for this encounter.   BP 122/66   Pulse 70   Ht 5\' 4"  (1.626 m)   Wt 102.3 kg (225  lb 9.6 oz)   SpO2 99%   BMI 38.72 kg/m  General: NAD Neck: No JVD, no thyromegaly or thyroid nodule.  Lungs: Clear to auscultation bilaterally with normal respiratory effort. CV: Nondisplaced PMI.  Heart regular S1/S2, no S3/S4, no murmur.  No peripheral edema.  No carotid bruit.  Normal pedal pulses.  Abdomen: Soft, nontender, no hepatosplenomegaly, no distention.  Skin: Intact without lesions or rashes.  Neurologic: Alert and oriented x 3.  Psych: Normal affect. Extremities: No clubbing or cyanosis.  HEENT: Normal.   Assessment/Plan: 1. Chronic systolic CHF: Nonischemic cardiomyopathy. Echo in 2018 with EF 45-50%. Echo in 11/20 with EF 25-30%, diffuse hypokinesis, mildly decreased RV systolic function, moderate MR. LHC/RHC in 11/20 with no significant coronary disease, markedly elevated filling pressures and low cardiac output (CI 1.6). 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 Cardiac MRI 11/20 showed LVEF 34%, RVEF 30%, no LGE => perhaps pointing to tachy-mediated CMP as more likely etiology.  Initially required inotropic support w/ milrinone but able to titrate off.  Now s/p AV nodal ablation with MDT CRT-P.  Doing well symptomatically since discharge with NYHA class II symptoms.  Echo in 2/21 showed EF up to 40-45%.  Volume status looks ok today by exam and Optivol, weight stable.  - Decrease torsemide to 20 mg every other day and increase Entresto to 49/51 bid, BMET today and in 10 days.    - Continue spironolactone to 25 daily.  - Continue digoxin 0.0625 mg daily.  Check digoxin level.  - Continue Coreg 6.25 mg bid.   - She needs to get more active, we discussed taking walks outdoors.  2. Atrial fibrillation: Persistent atrial fibrillation, it appears, at least since 9/20 when she was admitted with COVID-19. Prior, she had paroxysmal atrial fibrillation and was  maintained on sotalol. She is now off sotalol.  Unable to cardiovert or rate control atrial fibrillation. Therefore, she had AV nodal ablation with BiV pacing.  - Now off amiodarone.   - Continue Xarelto 20 mg daily.  3. Mitral regurgitation: Functional MR, moderate on TEE.  Rate was controlled on cardiac MRI, MR was less impressive.  Minimal MR on echo in 2/21.  4. CKD stage 3: BMET today.  5. Suspect OSA: I will arrange for sleep study.   Followup in 3 months.   [name] 01/05/2020

## 2020-01-14 ENCOUNTER — Other Ambulatory Visit: Payer: Self-pay

## 2020-01-14 ENCOUNTER — Ambulatory Visit (HOSPITAL_COMMUNITY)
Admission: RE | Admit: 2020-01-14 | Discharge: 2020-01-14 | Disposition: A | Discharge status: Home / Self Care | Payer: Medicare Other | Source: Ambulatory Visit | Attending: Internal Medicine | Admitting: Internal Medicine

## 2020-01-14 DIAGNOSIS — I5022 Chronic systolic (congestive) heart failure: Secondary | ICD-10-CM | POA: Insufficient documentation

## 2020-01-14 LAB — BASIC METABOLIC PANEL
Anion gap: 10 (ref 5–15)
BUN: 17 mg/dL (ref 8–23)
CO2: 27 mmol/L (ref 22–32)
Calcium: 10 mg/dL (ref 8.9–10.3)
Chloride: 105 mmol/L (ref 98–111)
Creatinine, Ser: 1.21 mg/dL — ABNORMAL HIGH (ref 0.44–1.00)
GFR calc Af Amer: 54 mL/min — ABNORMAL LOW (ref >60)
GFR calc non Af Amer: 47 mL/min — ABNORMAL LOW (ref >60)
Glucose, Bld: 152 mg/dL — ABNORMAL HIGH (ref 70–99)
Potassium: 4.4 mmol/L (ref 3.5–5.1)
Sodium: 142 mmol/L (ref 135–145)

## 2020-01-24 ENCOUNTER — Other Ambulatory Visit: Payer: Self-pay | Admitting: Internal Medicine

## 2020-01-27 DIAGNOSIS — U071 COVID-19: Secondary | ICD-10-CM | POA: Diagnosis not present

## 2020-01-27 DIAGNOSIS — K529 Noninfective gastroenteritis and colitis, unspecified: Secondary | ICD-10-CM | POA: Diagnosis not present

## 2020-01-27 DIAGNOSIS — J189 Pneumonia, unspecified organism: Secondary | ICD-10-CM | POA: Diagnosis not present

## 2020-01-27 DIAGNOSIS — R531 Weakness: Secondary | ICD-10-CM | POA: Diagnosis not present

## 2020-02-05 ENCOUNTER — Encounter: Payer: Self-pay | Admitting: Internal Medicine

## 2020-02-05 ENCOUNTER — Ambulatory Visit (INDEPENDENT_AMBULATORY_CARE_PROVIDER_SITE_OTHER): Payer: Medicare Other | Admitting: Internal Medicine

## 2020-02-05 ENCOUNTER — Other Ambulatory Visit: Payer: Self-pay

## 2020-02-05 VITALS — BP 98/60 | HR 70 | Ht 65.0 in | Wt 228.0 lb

## 2020-02-05 DIAGNOSIS — I4821 Permanent atrial fibrillation: Secondary | ICD-10-CM | POA: Insufficient documentation

## 2020-02-05 DIAGNOSIS — Z9861 Coronary angioplasty status: Secondary | ICD-10-CM

## 2020-02-05 DIAGNOSIS — Z950 Presence of cardiac pacemaker: Secondary | ICD-10-CM

## 2020-02-05 DIAGNOSIS — I2510 Atherosclerotic heart disease of native coronary artery without angina pectoris: Secondary | ICD-10-CM

## 2020-02-05 DIAGNOSIS — I442 Atrioventricular block, complete: Secondary | ICD-10-CM | POA: Diagnosis not present

## 2020-02-05 DIAGNOSIS — I10 Essential (primary) hypertension: Secondary | ICD-10-CM | POA: Diagnosis not present

## 2020-02-05 DIAGNOSIS — I428 Other cardiomyopathies: Secondary | ICD-10-CM | POA: Diagnosis not present

## 2020-02-05 LAB — CUP PACEART INCLINIC DEVICE CHECK
Battery Remaining Longevity: 98 mo
Battery Voltage: 3 V
Brady Statistic RA Percent Paced: 0 %
Brady Statistic RV Percent Paced: 99.31 %
Date Time Interrogation Session: 20210623130900
Implantable Lead Implant Date: 20201109
Implantable Lead Implant Date: 20201109
Implantable Lead Implant Date: 20201109
Implantable Lead Location: 753858
Implantable Lead Location: 753859
Implantable Lead Location: 753860
Implantable Lead Model: 4598
Implantable Lead Model: 5076
Implantable Lead Model: 5076
Implantable Pulse Generator Implant Date: 20201109
Lead Channel Impedance Value: 1083 Ohm
Lead Channel Impedance Value: 1159 Ohm
Lead Channel Impedance Value: 342 Ohm
Lead Channel Impedance Value: 418 Ohm
Lead Channel Impedance Value: 437 Ohm
Lead Channel Impedance Value: 494 Ohm
Lead Channel Impedance Value: 551 Ohm
Lead Channel Impedance Value: 589 Ohm
Lead Channel Impedance Value: 646 Ohm
Lead Channel Impedance Value: 665 Ohm
Lead Channel Impedance Value: 779 Ohm
Lead Channel Impedance Value: 874 Ohm
Lead Channel Impedance Value: 950 Ohm
Lead Channel Impedance Value: 950 Ohm
Lead Channel Pacing Threshold Amplitude: 0.5 V
Lead Channel Pacing Threshold Amplitude: 1.25 V
Lead Channel Pacing Threshold Pulse Width: 0.4 ms
Lead Channel Pacing Threshold Pulse Width: 0.8 ms
Lead Channel Sensing Intrinsic Amplitude: 2.1 mV
Lead Channel Sensing Intrinsic Amplitude: 7.1 mV
Lead Channel Setting Pacing Amplitude: 2.5 V
Lead Channel Setting Pacing Amplitude: 2.5 V
Lead Channel Setting Pacing Pulse Width: 0.4 ms
Lead Channel Setting Pacing Pulse Width: 0.8 ms
Lead Channel Setting Sensing Sensitivity: 2 mV

## 2020-02-05 NOTE — Progress Notes (Signed)
PCP: Colon Branch, MD Primary Cardiologist: Dr Aundra Dubin Primary EP:  Dr Rayann Heman  Melinda Mckinney is a 64 y.o. female who presents today for routine electrophysiology followup.  Since her last virtual visit the patient reports doing very well.  Her primary concern is with poor sleeping and feeling tired.  She had sleep study ordered by Dr Aundra Dubin but has not yet had this performed.  She is reasonably active.  SOB with moderate activity.  Today, she denies symptoms of palpitations, chest pain,  lower extremity edema, dizziness, presyncope, or syncope.  The patient is otherwise without complaint today.   Past Medical History:  Diagnosis Date  . Anemia   . Anxiety and depression   . Asthma   . Borderline diabetes   . CAD (coronary artery disease) ~2009   stent   . Cervical cancer ([place])    "stage 1; had cryotherapy"  . Chronic lower back pain   . Complete heart block ([place]) 06/2019   s/p AV nodal ablation and CRT-P by Dr Lovena Le  . DDD (degenerative disc disease), lumbar    Dr. Nelva Bush, recommends lumbar epidural steroid injections L5-S1 to the right  . Depression   . Fibromyalgia   . GERD (gastroesophageal reflux disease)   . High cholesterol   . History of hiatal hernia   . History of kidney stones   . Hypertension   . Hypothyroidism   . Osteoporosis   . Permanent atrial fibrillation ([place]) 07/2017   s/p AV nodal ablation  . Thyroid disease    Past Surgical History:  Procedure Laterality Date  . AV NODE ABLATION N/A 06/24/2019   Procedure: AV NODE ABLATION;  Surgeon: Evans Lance, MD;  Location: [facility];  Service: Cardiovascular;  Laterality: N/A;  . BIV PACEMAKER INSERTION CRT-P N/A 06/24/2019   Procedure: BIV PACEMAKER INSERTION CRT-P;  Surgeon: Evans Lance, MD;  Location: [facility];  Service: Cardiovascular;  Laterality: N/A;  . CARDIAC CATHETERIZATION  ~ 2008  . CARDIOVERSION N/A 08/05/2017   Procedure: CARDIOVERSION;  Surgeon: Evans Lance, MD;   Location: [facility];  Service: Cardiovascular;  Laterality: N/A;  . CARDIOVERSION N/A 08/24/2017   Procedure: CARDIOVERSION;  Surgeon: Sanda Klein, MD;  Location: [facility];  Service: Cardiovascular;  Laterality: N/A;  . CARDIOVERSION N/A 06/19/2019   Procedure: CARDIOVERSION;  Surgeon: Larey Dresser, MD;  Location: [facility];  Service: Cardiovascular;  Laterality: N/A;  . CARDIOVERSION N/A 06/21/2019   Procedure: CARDIOVERSION;  Surgeon: Larey Dresser, MD;  Location: [facility];  Service: Cardiovascular;  Laterality: N/A;  . [place]; ~ 1978/1979; 1984  . ESOPHAGOMYOTOMY  2009  . GYNECOLOGIC CRYOSURGERY  X 2   "stage 1 cancer"  . HERNIA REPAIR  X 6   "all in my stomach" (07/27/2017)  . KNEE ARTHROSCOPY Right   . NEPHRECTOMY Right 1991   in [place], due to fibrosis and lithiasis  . RIGHT/LEFT HEART CATH AND CORONARY ANGIOGRAPHY N/A 06/17/2019   Procedure: RIGHT/LEFT HEART CATH AND CORONARY ANGIOGRAPHY;  Surgeon: Lorretta Harp, MD;  Location: [place] [facility];  Service: Cardiovascular;  Laterality: N/A;  . TEE WITHOUT CARDIOVERSION N/A 06/19/2019   Procedure: TRANSESOPHAGEAL ECHOCARDIOGRAM (TEE);  Surgeon: Larey Dresser, MD;  Location: [facility];  Service: Cardiovascular;  Laterality: N/A;  . TUBAL LIGATION      ROS- all systems are reviewed and negative except as per HPI above  Current Outpatient Medications  Medication Sig  Dispense Refill  . alendronate (FOSAMAX) 70 MG tablet Take 1 tablet (70 mg total) by mouth every 7 (seven) days. Take with a full glass of water on an empty stomach. 12 tablet 3  . allopurinol (ZYLOPRIM) 100 MG tablet Take 1 tablet (100 mg total) by mouth daily. 90 tablet 0  . atorvastatin (LIPITOR) 40 MG tablet Take 1.5 tablets (60 mg total) by mouth at bedtime. 135 tablet 0  . carvedilol (COREG) 6.25 MG tablet Take 1 tablet (6.25 mg total) by mouth 2 (two) times daily with a meal. 180 tablet 1  . Cholecalciferol (VITAMIN D3) 5000 UNITS CAPS  Take 5,000 Units by mouth daily.     . clobetasol cream (TEMOVATE) 0.05 %     . cyclobenzaprine (FLEXERIL) 10 MG tablet Take 1 tablet (10 mg total) by mouth 2 (two) times daily as needed for muscle spasms. 60 tablet 1  . digoxin (LANOXIN) 0.125 MG tablet Take 0.5 tablets (0.0625 mg total) by mouth every other day. 22 tablet 3  . DULoxetine (CYMBALTA) 60 MG capsule Take 1 capsule (60 mg total) by mouth daily. 90 capsule 0  . levothyroxine (SYNTHROID) 137 MCG tablet Take 1 tablet (137 mcg total) by mouth daily before breakfast. 45 tablet 3  . metFORMIN (GLUCOPHAGE) 500 MG tablet Take 1 tablet (500 mg total) by mouth 2 (two) times daily with a meal. 180 tablet 0  . omeprazole (PRILOSEC) 20 MG capsule Take 1 capsule (20 mg total) by mouth 2 (two) times daily before a meal. 180 capsule 3  . rivaroxaban (XARELTO) 20 MG TABS tablet Take 1 tablet (20 mg total) by mouth daily with supper. 90 tablet 3  . sacubitril-valsartan (ENTRESTO) 49-51 MG Take 1 tablet by mouth 2 (two) times daily. 180 tablet 3  . spironolactone (ALDACTONE) 25 MG tablet Take 1 tablet (25 mg total) by mouth daily. 30 tablet 11  . torsemide (DEMADEX) 20 MG tablet Take 1 tablet (20 mg total) by mouth every other day. 45 tablet 3  . zolpidem (AMBIEN) 5 MG tablet Take 1 tablet (5 mg total) by mouth at bedtime as needed. for sleep 90 tablet 1   No current facility-administered medications for this visit.    Physical Exam: Vitals:   02/05/20 1137  BP: 98/60  Pulse: 70  SpO2: 92%  Weight: 228 lb (103.4 kg)  Height: 5\' 5"  (1.651 m)    GEN- The patient is well appearing, alert and oriented x 3 today.   Head- normocephalic, atraumatic Eyes-  Sclera clear, conjunctiva pink Ears- hearing intact Oropharynx- clear Lungs-  normal work of breathing Chest- pacemaker pocket is well healed Heart- Regular rate and rhythm (paced) GI- soft, NT, ND, + BS Extremities- no clubbing, cyanosis, or edema  Pacemaker interrogation- reviewed in  detail today,  See PACEART report  ekg tracing ordered today is personally reviewed and shows afib, BiV paced  Echo reviewed Dr Oleh Genin notes reviewed  Assessment and Plan:  1.  complete heart block s/p AV nodal ablation Normal BiV pacemaker function See Pace Art report Reprogrammed to chronic lead settings today Also programmed VVIR 60 bpm she is not device dependant today  2. Permanent afib S/p AV nodal ablation EF has improved with rate control to 40-45% chads2vasc score is 5.  She on on xarelto  3. Nonischemic CM Likely tachy mediated EF continues to improve s/p AV nodal ablation Echo reviewed Followed with Dr Morey Hummingbird in [facility] device [facility]  4. CAD No ischemic symptoms  5.  Snoring, fatigue She had sleep study ordered by Dr Aundra Dubin I have advised that she proceed with this.  Risks, benefits and potential toxicities for medications prescribed and/or refilled reviewed with patient today.   Return to see EP PA in 6 months I will see in a year  Thompson Grayer MD, [facility] 02/05/2020 12:03 PM

## 2020-02-05 NOTE — Patient Instructions (Addendum)
Medication Instructions:  Your physician recommends that you continue on your current medications as directed. Please refer to the Current Medication list given to you today.  Labwork: None ordered.  Testing/Procedures: None ordered.  Follow-Up: Your physician wants you to follow-up in: 6 months [place], [place].   You will receive a reminder letter in the mail two months in advance. If you don't receive a letter, please call our office to schedule the follow-up appointment.  Your physician wants you to follow-up in: one year with Dr. Rayann Heman.   You will receive a reminder letter in the mail two months in advance. If you don't receive a letter, please call our office to schedule the follow-up appointment.  Remote monitoring is used to monitor your Pacemaker from home. This monitoring reduces the number of office visits required to check your device to one time per year. It allows [place] to keep an eye on the functioning of your device to ensure it is working properly. You are scheduled for a device check from home on 03/23/2020. You may send your transmission at any time that day. If you have a wireless device, the transmission will be sent automatically. After your physician reviews your transmission, you will receive a postcard with your next transmission date.  Any Other Special Instructions Will Be Listed Below (If Applicable).  If you need a refill on your cardiac medications before your next appointment, please call your pharmacy.

## 2020-02-12 ENCOUNTER — Other Ambulatory Visit: Payer: Self-pay | Admitting: Internal Medicine

## 2020-02-20 ENCOUNTER — Encounter: Payer: Self-pay | Admitting: Internal Medicine

## 2020-02-20 ENCOUNTER — Telehealth (HOSPITAL_COMMUNITY): Payer: Self-pay

## 2020-02-20 NOTE — Telephone Encounter (Signed)
Order, OV note, stop bang and demographics all faxed to [facility] at [phone]  

## 2020-02-21 ENCOUNTER — Ambulatory Visit: Payer: Medicare Other | Admitting: Internal Medicine

## 2020-02-26 DIAGNOSIS — K529 Noninfective gastroenteritis and colitis, unspecified: Secondary | ICD-10-CM | POA: Diagnosis not present

## 2020-02-26 DIAGNOSIS — J189 Pneumonia, unspecified organism: Secondary | ICD-10-CM | POA: Diagnosis not present

## 2020-02-26 DIAGNOSIS — R531 Weakness: Secondary | ICD-10-CM | POA: Diagnosis not present

## 2020-02-26 DIAGNOSIS — U071 COVID-19: Secondary | ICD-10-CM | POA: Diagnosis not present

## 2020-03-06 ENCOUNTER — Ambulatory Visit: Payer: Medicare Other | Admitting: Internal Medicine

## 2020-03-11 ENCOUNTER — Ambulatory Visit: Payer: Medicare Other | Admitting: Internal Medicine

## 2020-03-18 ENCOUNTER — Other Ambulatory Visit: Payer: Self-pay

## 2020-03-18 ENCOUNTER — Ambulatory Visit (INDEPENDENT_AMBULATORY_CARE_PROVIDER_SITE_OTHER): Payer: Medicare Other | Admitting: Internal Medicine

## 2020-03-18 ENCOUNTER — Ambulatory Visit (HOSPITAL_BASED_OUTPATIENT_CLINIC_OR_DEPARTMENT_OTHER)
Admission: RE | Admit: 2020-03-18 | Discharge: 2020-03-18 | Disposition: A | Discharge status: Home / Self Care | Payer: Medicare Other | Source: Ambulatory Visit | Attending: Internal Medicine | Admitting: Internal Medicine

## 2020-03-18 ENCOUNTER — Encounter: Payer: Self-pay | Admitting: Internal Medicine

## 2020-03-18 VITALS — BP 96/67 | HR 87 | Temp 98.7°F | Resp 18 | Ht 65.0 in | Wt 225.2 lb

## 2020-03-18 DIAGNOSIS — S6991XA Unspecified injury of right wrist, hand and finger(s), initial encounter: Secondary | ICD-10-CM | POA: Diagnosis not present

## 2020-03-18 DIAGNOSIS — W19XXXA Unspecified fall, initial encounter: Secondary | ICD-10-CM | POA: Diagnosis not present

## 2020-03-18 DIAGNOSIS — M25531 Pain in right wrist: Secondary | ICD-10-CM | POA: Insufficient documentation

## 2020-03-18 DIAGNOSIS — R739 Hyperglycemia, unspecified: Secondary | ICD-10-CM | POA: Diagnosis not present

## 2020-03-18 DIAGNOSIS — E039 Hypothyroidism, unspecified: Secondary | ICD-10-CM | POA: Diagnosis not present

## 2020-03-18 DIAGNOSIS — Y939 Activity, unspecified: Secondary | ICD-10-CM | POA: Insufficient documentation

## 2020-03-18 DIAGNOSIS — E785 Hyperlipidemia, unspecified: Secondary | ICD-10-CM

## 2020-03-18 DIAGNOSIS — Y929 Unspecified place or not applicable: Secondary | ICD-10-CM | POA: Insufficient documentation

## 2020-03-18 MED ORDER — KETOCONAZOLE 2 % EX CREA: 1.0000 "application " | TOPICAL_CREAM | Freq: Every day | CUTANEOUS | 0 refills | Status: AC

## 2020-03-18 NOTE — Progress Notes (Signed)
Pre visit review using our clinic review tool, if applicable. No additional management support is needed unless otherwise documented below in the visit note. 

## 2020-03-18 NOTE — Patient Instructions (Addendum)
Please schedule Medicare Wellness with Glenard Haring.   Per our records you are due for an eye exam. Please contact your eye doctor to schedule an appointment. Please have them send copies of your office visit notes to [place]. Our fax number is [phone].    Please apply the cream twice a day for 1 week.  Call if no better  Check your blood pressure at least twice a week BP GOAL is between 100/65 and  135/85. If it is consistently higher or lower, let me know  We are referring to the orthopedic doctor, suspect a injury at the wrist.  GO TO THE LAB : Get the blood work     [place], [place] back for a checkup in 3 months   STOP BY THE FIRST FLOOR:  get the XR    Fall Prevention in the Home, Adult Falls can cause injuries and can affect people from all age groups. There are many simple things that you can do to make your home safe and to help prevent falls. Ask for help when making these changes, if needed. What actions can I take to prevent falls? General instructions  Use good lighting in all rooms. Replace any light bulbs that burn out.  Turn on lights if it is dark. Use night-lights.  Place frequently used items in easy-to-reach places. Lower the shelves around your home if necessary.  Set up furniture so that there are clear paths around it. Avoid moving your furniture around.  Remove throw rugs and other tripping hazards from the floor.  Avoid walking on wet floors.  Fix any uneven floor surfaces.  Add color or contrast paint or tape to grab bars and handrails in your home. Place contrasting color strips on the first and last steps of stairways.  When you use a stepladder, make sure that it is completely opened and that the sides are firmly locked. Have someone hold the ladder while you are using it. Do not climb a closed stepladder.  Be aware of any and all pets. What can I do in the bathroom?      Keep the floor dry.  Immediately clean up any water that spills onto the floor.  Remove soap buildup in the tub or shower on a regular basis.  Use non-skid mats or decals on the floor of the tub or shower.  Attach bath mats securely with double-sided, non-slip rug tape.  If you need to sit down while you are in the shower, use a plastic, non-slip stool.  Install grab bars by the toilet and in the tub and shower. Do not use towel bars as grab bars. What can I do in the bedroom?  Make sure that a bedside light is easy to reach.  Do not use oversized bedding that drapes onto the floor.  Have a firm chair that has side arms to use for getting dressed. What can I do in the kitchen?  Clean up any spills right away.  If you need to reach for something above you, use a sturdy step stool that has a grab bar.  Keep electrical cables out of the way.  Do not use floor polish or wax that makes floors slippery. If you must use wax, make sure that it is non-skid floor wax. What can I do in the stairways?  Do not leave any items on the stairs.  Make sure that you have a light switch at the top of  the stairs and the bottom of the stairs. Have them installed if you do not have them.  Make sure that there are handrails on both sides of the stairs. Fix handrails that are broken or loose. Make sure that handrails are as long as the stairways.  Install non-slip stair treads on all stairs in your home.  Avoid having throw rugs at the top or bottom of stairways, or secure the rugs with carpet tape to prevent them from moving.  Choose a carpet design that does not hide the edge of steps on the stairway.  Check any carpeting to make sure that it is firmly attached to the stairs. Fix any carpet that is loose or worn. What can I do on the outside of my home?  Use bright outdoor lighting.  Regularly repair the edges of walkways and driveways and fix any cracks.  Remove high doorway thresholds.  Trim any shrubbery on  the main path into your home.  Regularly check that handrails are securely fastened and in good repair. Both sides of any steps should have handrails.  Install guardrails along the edges of any raised decks or porches.  Clear walkways of debris and clutter, including tools and rocks.  Have leaves, snow, and ice cleared regularly.  Use sand or salt on walkways during winter months.  In the garage, clean up any spills right away, including grease or oil spills. What other actions can I take?  Wear closed-toe shoes that fit well and support your feet. Wear shoes that have rubber soles or low heels.  Use mobility aids as needed, such as canes, walkers, scooters, and crutches.  Review your medicines with your health care provider. Some medicines can cause dizziness or changes in blood pressure, which increase your risk of falling. Talk with your health care provider about other ways that you can decrease your risk of falls. This may include working with a physical therapist or trainer to improve your strength, balance, and endurance. Where to find more information  Centers for Disease Control and Prevention, STEADI: WebmailGuide.co.za  [employer] on Aging: BrainJudge.co.uk Contact a health care provider if:  You are afraid of falling at home.  You feel weak, drowsy, or dizzy at home.  You fall at home. Summary  There are many simple things that you can do to make your home safe and to help prevent falls.  Ways to make your home safe include removing tripping hazards and installing grab bars in the bathroom.  Ask for help when making these changes in your home. This information is not intended to replace advice given to you by your health care provider. Make sure you discuss any questions you have with your health care provider. Document Revised: 07/14/2017 Document Reviewed: 03/16/2017 Elsevier Patient Education  2020 [employer].

## 2020-03-18 NOTE — Progress Notes (Signed)
Subjective:    Patient ID: Melinda Mckinney, female    DOB: 08-Nov-1955, 64 y.o.   MRN: [id number]  DOS:  03/18/2020 Type of visit - description: Follow-up From the cardiac standpoint she feels great.  Reports a fall a week ago, she tripped and fell, a mechanical issue, BP noted to be low today but she reports that she did not have any dizziness prior to the fall, no syncope.  No head or neck injury. She stopped the fall with her right hand, since then is having pain with the right wrist.  Also rash, started at the left breast and has extended significantly.  See physical exam.  Review of Systems No chest pain, no difficulty breathing, no edema or palpitations  Past Medical History:  Diagnosis Date  . Anemia   . Anxiety and depression   . Asthma   . Borderline diabetes   . CAD (coronary artery disease) ~2009   stent   . Cervical cancer ([place])    "stage 1; had cryotherapy"  . Chronic lower back pain   . Complete heart block ([place]) 06/2019   s/p AV nodal ablation and CRT-P by Dr Lovena Le  . DDD (degenerative disc disease), lumbar    Dr. Nelva Bush, recommends lumbar epidural steroid injections L5-S1 to the right  . Depression   . Fibromyalgia   . GERD (gastroesophageal reflux disease)   . High cholesterol   . History of hiatal hernia   . History of kidney stones   . Hypertension   . Hypothyroidism   . Osteoporosis   . Permanent atrial fibrillation ([place]) 07/2017   s/p AV nodal ablation  . Thyroid disease     Past Surgical History:  Procedure Laterality Date  . AV NODE ABLATION N/A 06/24/2019   Procedure: AV NODE ABLATION;  Surgeon: Evans Lance, MD;  Location: [facility] [place] [facility];  Service: Cardiovascular;  Laterality: N/A;  . BIV PACEMAKER INSERTION CRT-P N/A 06/24/2019   Procedure: BIV PACEMAKER INSERTION CRT-P;  Surgeon: Evans Lance, MD;  Location: [facility];  Service: Cardiovascular;  Laterality: N/A;  . CARDIAC CATHETERIZATION  ~ 2008  . CARDIOVERSION N/A  08/05/2017   Procedure: CARDIOVERSION;  Surgeon: Evans Lance, MD;  Location: [place];  Service: Cardiovascular;  Laterality: N/A;  . CARDIOVERSION N/A 08/24/2017   Procedure: CARDIOVERSION;  Surgeon: Sanda Klein, MD;  Location: [facility];  Service: Cardiovascular;  Laterality: N/A;  . CARDIOVERSION N/A 06/19/2019   Procedure: CARDIOVERSION;  Surgeon: Larey Dresser, MD;  Location: [place];  Service: Cardiovascular;  Laterality: N/A;  . CARDIOVERSION N/A 06/21/2019   Procedure: CARDIOVERSION;  Surgeon: Larey Dresser, MD;  Location: [facility] ENDOSCOPY;  Service: Cardiovascular;  Laterality: N/A;  . [place]; ~ 1978/1979; 1984  . ESOPHAGOMYOTOMY  2009  . GYNECOLOGIC CRYOSURGERY  X 2   "stage 1 cancer"  . HERNIA REPAIR  X 6   "all in my stomach" (07/27/2017)  . KNEE ARTHROSCOPY Right   . NEPHRECTOMY Right 1991   in [place], due to fibrosis and lithiasis  . RIGHT/LEFT HEART CATH AND CORONARY ANGIOGRAPHY N/A 06/17/2019   Procedure: RIGHT/LEFT HEART CATH AND CORONARY ANGIOGRAPHY;  Surgeon: Lorretta Harp, MD;  Location: [place] [facility];  Service: Cardiovascular;  Laterality: N/A;  . TEE WITHOUT CARDIOVERSION N/A 06/19/2019   Procedure: TRANSESOPHAGEAL ECHOCARDIOGRAM (TEE);  Surgeon: Larey Dresser, MD;  Location: [place];  Service: Cardiovascular;  Laterality: N/A;  . TUBAL LIGATION  Allergies as of 03/18/2020      Reactions   Penicillins Swelling, Rash   Has patient had a PCN reaction causing immediate rash, facial/tongue/throat swelling, SOB or lightheadedness with hypotension: No Has patient had a PCN reaction causing severe rash involving mucus membranes or skin necrosis: No Has patient had a PCN reaction that required hospitalization: No Has patient had a PCN reaction occurring within the last 10 years: No If all of the above answers are "NO", then may proceed with Cephalosporin use.      Medication List       Accurate as of March 18, 2020 11:59 PM. If you  have any questions, ask your nurse or doctor.        alendronate 70 MG tablet Commonly known as: FOSAMAX Take 1 tablet (70 mg total) by mouth every 7 (seven) days. Take with a full glass of water on an empty stomach.   allopurinol 100 MG tablet Commonly known as: ZYLOPRIM Take 1 tablet (100 mg total) by mouth daily.   atorvastatin 40 MG tablet Commonly known as: LIPITOR Take 1.5 tablets (60 mg total) by mouth at bedtime.   carvedilol 6.25 MG tablet Commonly known as: COREG Take 1 tablet (6.25 mg total) by mouth 2 (two) times daily with a meal.   clobetasol cream 0.05 % Commonly known as: TEMOVATE   cyclobenzaprine 10 MG tablet Commonly known as: FLEXERIL Take 1 tablet (10 mg total) by mouth 2 (two) times daily as needed for muscle spasms.   digoxin 0.125 MG tablet Commonly known as: LANOXIN Take 0.5 tablets (0.0625 mg total) by mouth every other day.   DULoxetine 60 MG capsule Commonly known as: CYMBALTA Take 1 capsule (60 mg total) by mouth daily.   Entresto 49-51 MG Generic drug: sacubitril-valsartan Take 1 tablet by mouth 2 (two) times daily.   ketoconazole 2 % cream Commonly known as: NIZORAL Apply 1 application topically daily. Started by: Kathlene November, MD   levothyroxine 137 MCG tablet Commonly known as: SYNTHROID Take 1 tablet (137 mcg total) by mouth daily before breakfast.   metFORMIN 500 MG tablet Commonly known as: GLUCOPHAGE Take 1 tablet (500 mg total) by mouth 2 (two) times daily with a meal.   omeprazole 20 MG capsule Commonly known as: PRILOSEC Take 1 capsule (20 mg total) by mouth 2 (two) times daily before a meal.   rivaroxaban 20 MG Tabs tablet Commonly known as: Xarelto Take 1 tablet (20 mg total) by mouth daily with supper.   spironolactone 25 MG tablet Commonly known as: ALDACTONE Take 1 tablet (25 mg total) by mouth daily.   torsemide 20 MG tablet Commonly known as: DEMADEX Take 1 tablet (20 mg total) by mouth every other day.     Vitamin D3 125 MCG (5000 UT) Caps Take 5,000 Units by mouth daily.   zolpidem 5 MG tablet Commonly known as: AMBIEN Take 1 tablet (5 mg total) by mouth at bedtime as needed. for sleep          Objective:   Physical Exam Skin:        BP 96/67 (BP Location: Left Arm, Patient Position: Sitting, Cuff Size: Normal)   Pulse 87   Temp 98.7 F (37.1 C) (Oral)   Resp 18   Ht 5\' 5"  (1.651 m)   Wt 225 lb 4 oz (102.2 kg)   SpO2 99%   BMI 37.48 kg/m  General:   Well developed, NAD, BMI noted. HEENT:  Normocephalic . Face symmetric, atraumatic  Lungs:  CTA B Normal respiratory effort, no intercostal retractions, no accessory muscle use. Heart: RRR,  no murmur.  DM foot exam: No edema, good pulses, pinprick normal. MSK: Right wrist and hand: No deformities, + TTP at the ulnar side of the wrist, wrist range of motion limited throughout due to pain. Left wrist normal Skin: Not pale. Not jaundice Neurologic:  alert & oriented X3.  Speech normal, gait appropriate for age and unassisted Psych--  Cognition and judgment appear intact.  Cooperative with normal attention span and concentration.  Behavior appropriate. No anxious or depressed appearing.      Assessment       Assessment   Hyperglycemia HTN High cholesterol Hypothyroidism Anxiety depression, insomnia CV: --CAD: stent ~ 2009 --Atrial fibrillation DX 06-2017  --06-2019 A. fib, RVR, failed cardioversion,, cath: Normal coronaries, had AV nodal ablation, ICD implanted  --Non ischemic cardiomyopathy, DX 06-2019, tachycardia related? Obesity  Vitamin D deficiency, saw Dr. Cruzita Lederer 07-2016, rtc 1 year, celiac dz test (-), unlikely  Malabsortion; cont supplements  GERD H/o achalasia, s/p [place] 2009 SINGLE KIDNEY---S/p R  Nephrectomy ([place] 1991 due to stones/fibrosis) MSK: --Fibromyalgia --DJD  Knee pain R --Back pain -- s/p epidural 10-16 Osteoporosis: Used to be managed by gynecology, now PCP. 08-2014 T  score -2.8, 09-2015: T score -2.7.- Was Rx Evista 09-2014  h/o cervical dysplasia Derm: vulvar pruritus, temovate prn ok per gyn  PLAN: Hyperglycemia: Check A1c, feet exam normal HTN: BP today is in the low side, she is completely asymptomatic, on multiple medications.  No change, last BMP normal. Hypothyroidism: On Synthroid, check a TSH High cholesterol: On Lipitor, last FLP okay, check LFTs CV: Saw cardiology 02/05/2020, note reviewed.  Felt to be stable, EF actually improved with heart rate control. Rash: Extremely pruritic, likely fungal, rx ketoconazole.  Call if not better Wrist injury: Had a fall last week, hurting at the wrist since then, suspect fracture.  Check x-ray, refer to Ortho. Falls: Mechanical, prevention encourage, offered formal PT, declined it reassess in 3 months  RTC 3 months   This visit occurred during the SARS-CoV-2 public health emergency.  Safety protocols were in place, including screening questions prior to the visit, additional usage of staff PPE, and extensive cleaning of exam room while observing appropriate contact time as indicated for disinfecting solutions.

## 2020-03-19 LAB — TSH: TSH: 0.18 u[IU]/mL — ABNORMAL LOW (ref 0.35–4.50)

## 2020-03-19 LAB — AST: AST: 14 U/L (ref 0–37)

## 2020-03-19 LAB — HEMOGLOBIN A1C: Hgb A1c MFr Bld: 6.7 % — ABNORMAL HIGH (ref 4.6–6.5)

## 2020-03-19 LAB — ALT: ALT: 10 U/L (ref 0–35)

## 2020-03-19 NOTE — Assessment & Plan Note (Signed)
Hyperglycemia: Check A1c, feet exam normal HTN: BP today is in the low side, she is completely asymptomatic, on multiple medications.  No change, last BMP normal. Hypothyroidism: On Synthroid, check a TSH High cholesterol: On Lipitor, last FLP okay, check LFTs CV: Saw cardiology 02/05/2020, note reviewed.  Felt to be stable, EF actually improved with heart rate control. Rash: Extremely pruritic, likely fungal, rx ketoconazole.  Call if not better Wrist injury: Had a fall last week, hurting at the wrist since then, suspect fracture.  Check x-ray, refer to Ortho. Falls: Mechanical, prevention encourage, offered formal PT, declined it reassess in 3 months  RTC 3 months

## 2020-03-20 MED ORDER — LEVOTHYROXINE SODIUM 125 MCG PO TABS
125.0000 ug | ORAL_TABLET | Freq: Every day | ORAL | 0 refills | Status: DC
Start: 2020-03-20 — End: 2020-05-21

## 2020-03-20 NOTE — Addendum Note (Signed)
Addended byDamita Dunnings D on: 03/20/2020 07:41 AM   Modules accepted: Orders

## 2020-03-23 ENCOUNTER — Ambulatory Visit (INDEPENDENT_AMBULATORY_CARE_PROVIDER_SITE_OTHER): Payer: Medicare Other | Admitting: *Deleted

## 2020-03-23 DIAGNOSIS — I4821 Permanent atrial fibrillation: Secondary | ICD-10-CM | POA: Diagnosis not present

## 2020-03-23 LAB — CUP PACEART REMOTE DEVICE CHECK
Battery Remaining Longevity: 111 mo
Battery Voltage: 3.01 V
Brady Statistic AP VP Percent: 0 %
Brady Statistic AP VS Percent: 0 %
Brady Statistic AS VP Percent: 0 %
Brady Statistic AS VS Percent: 0 %
Brady Statistic RA Percent Paced: 0 %
Brady Statistic RV Percent Paced: 99.81 %
Date Time Interrogation Session: 20210809044533
Implantable Lead Implant Date: 20201109
Implantable Lead Implant Date: 20201109
Implantable Lead Implant Date: 20201109
Implantable Lead Location: 753858
Implantable Lead Location: 753859
Implantable Lead Location: 753860
Implantable Lead Model: 4598
Implantable Lead Model: 5076
Implantable Lead Model: 5076
Implantable Pulse Generator Implant Date: 20201109
Lead Channel Impedance Value: 1083 Ohm
Lead Channel Impedance Value: 1102 Ohm
Lead Channel Impedance Value: 342 Ohm
Lead Channel Impedance Value: 399 Ohm
Lead Channel Impedance Value: 418 Ohm
Lead Channel Impedance Value: 494 Ohm
Lead Channel Impedance Value: 532 Ohm
Lead Channel Impedance Value: 589 Ohm
Lead Channel Impedance Value: 627 Ohm
Lead Channel Impedance Value: 646 Ohm
Lead Channel Impedance Value: 760 Ohm
Lead Channel Impedance Value: 855 Ohm
Lead Channel Impedance Value: 893 Ohm
Lead Channel Impedance Value: 893 Ohm
Lead Channel Pacing Threshold Amplitude: 0.5 V
Lead Channel Pacing Threshold Amplitude: 1.25 V
Lead Channel Pacing Threshold Pulse Width: 0.4 ms
Lead Channel Pacing Threshold Pulse Width: 0.8 ms
Lead Channel Sensing Intrinsic Amplitude: 0.625 mV
Lead Channel Sensing Intrinsic Amplitude: 0.625 mV
Lead Channel Sensing Intrinsic Amplitude: 6.25 mV
Lead Channel Sensing Intrinsic Amplitude: 7.125 mV
Lead Channel Setting Pacing Amplitude: 1.75 V
Lead Channel Setting Pacing Amplitude: 2.5 V
Lead Channel Setting Pacing Pulse Width: 0.4 ms
Lead Channel Setting Pacing Pulse Width: 0.8 ms
Lead Channel Setting Sensing Sensitivity: 2 mV

## 2020-03-24 ENCOUNTER — Other Ambulatory Visit: Payer: Self-pay | Admitting: Internal Medicine

## 2020-03-24 NOTE — Progress Notes (Signed)
Remote pacemaker transmission.   

## 2020-03-27 DIAGNOSIS — M25511 Pain in right shoulder: Secondary | ICD-10-CM | POA: Diagnosis not present

## 2020-03-27 DIAGNOSIS — M25531 Pain in right wrist: Secondary | ICD-10-CM | POA: Diagnosis not present

## 2020-03-28 DIAGNOSIS — R531 Weakness: Secondary | ICD-10-CM | POA: Diagnosis not present

## 2020-03-28 DIAGNOSIS — K529 Noninfective gastroenteritis and colitis, unspecified: Secondary | ICD-10-CM | POA: Diagnosis not present

## 2020-04-05 ENCOUNTER — Other Ambulatory Visit: Payer: Self-pay | Admitting: Internal Medicine

## 2020-04-08 DIAGNOSIS — M25511 Pain in right shoulder: Secondary | ICD-10-CM | POA: Diagnosis not present

## 2020-04-10 ENCOUNTER — Ambulatory Visit (HOSPITAL_COMMUNITY)
Admission: RE | Admit: 2020-04-10 | Discharge: 2020-04-10 | Disposition: A | Discharge status: Home / Self Care | Payer: Medicare Other | Source: Ambulatory Visit | Attending: Cardiology | Admitting: Cardiology

## 2020-04-10 ENCOUNTER — Other Ambulatory Visit: Payer: Self-pay

## 2020-04-10 VITALS — BP 154/96 | HR 86 | Wt 223.8 lb

## 2020-04-10 DIAGNOSIS — Z8616 Personal history of COVID-19: Secondary | ICD-10-CM | POA: Diagnosis not present

## 2020-04-10 DIAGNOSIS — Z87891 Personal history of nicotine dependence: Secondary | ICD-10-CM | POA: Insufficient documentation

## 2020-04-10 DIAGNOSIS — I5042 Chronic combined systolic (congestive) and diastolic (congestive) heart failure: Secondary | ICD-10-CM | POA: Diagnosis not present

## 2020-04-10 DIAGNOSIS — Z8249 Family history of ischemic heart disease and other diseases of the circulatory system: Secondary | ICD-10-CM | POA: Insufficient documentation

## 2020-04-10 DIAGNOSIS — N183 Chronic kidney disease, stage 3 unspecified: Secondary | ICD-10-CM | POA: Insufficient documentation

## 2020-04-10 DIAGNOSIS — M797 Fibromyalgia: Secondary | ICD-10-CM | POA: Diagnosis not present

## 2020-04-10 DIAGNOSIS — Z7901 Long term (current) use of anticoagulants: Secondary | ICD-10-CM | POA: Insufficient documentation

## 2020-04-10 DIAGNOSIS — K219 Gastro-esophageal reflux disease without esophagitis: Secondary | ICD-10-CM | POA: Diagnosis not present

## 2020-04-10 DIAGNOSIS — I340 Nonrheumatic mitral (valve) insufficiency: Secondary | ICD-10-CM | POA: Diagnosis not present

## 2020-04-10 DIAGNOSIS — R0602 Shortness of breath: Secondary | ICD-10-CM | POA: Diagnosis not present

## 2020-04-10 DIAGNOSIS — I2510 Atherosclerotic heart disease of native coronary artery without angina pectoris: Secondary | ICD-10-CM | POA: Insufficient documentation

## 2020-04-10 DIAGNOSIS — E039 Hypothyroidism, unspecified: Secondary | ICD-10-CM | POA: Insufficient documentation

## 2020-04-10 DIAGNOSIS — G4700 Insomnia, unspecified: Secondary | ICD-10-CM | POA: Diagnosis not present

## 2020-04-10 DIAGNOSIS — Z79899 Other long term (current) drug therapy: Secondary | ICD-10-CM | POA: Diagnosis not present

## 2020-04-10 DIAGNOSIS — I110 Hypertensive heart disease with heart failure: Secondary | ICD-10-CM | POA: Insufficient documentation

## 2020-04-10 DIAGNOSIS — I509 Heart failure, unspecified: Secondary | ICD-10-CM

## 2020-04-10 DIAGNOSIS — Z7984 Long term (current) use of oral hypoglycemic drugs: Secondary | ICD-10-CM | POA: Diagnosis not present

## 2020-04-10 DIAGNOSIS — I4819 Other persistent atrial fibrillation: Secondary | ICD-10-CM | POA: Diagnosis not present

## 2020-04-10 DIAGNOSIS — I4821 Permanent atrial fibrillation: Secondary | ICD-10-CM

## 2020-04-10 DIAGNOSIS — I428 Other cardiomyopathies: Secondary | ICD-10-CM | POA: Diagnosis not present

## 2020-04-10 LAB — CBC
HCT: 33.2 % — ABNORMAL LOW (ref 36.0–46.0)
Hemoglobin: 10.3 g/dL — ABNORMAL LOW (ref 12.0–15.0)
MCH: 28.9 pg (ref 26.0–34.0)
MCHC: 31 g/dL (ref 30.0–36.0)
MCV: 93.3 fL (ref 80.0–100.0)
Platelets: 305 10*3/uL (ref 150–400)
RBC: 3.56 MIL/uL — ABNORMAL LOW (ref 3.87–5.11)
RDW: 15 % (ref 11.5–15.5)
WBC: 7.5 10*3/uL (ref 4.0–10.5)
nRBC: 0 % (ref 0.0–0.2)

## 2020-04-10 LAB — BASIC METABOLIC PANEL
Anion gap: 11 (ref 5–15)
BUN: 15 mg/dL (ref 8–23)
CO2: 23 mmol/L (ref 22–32)
Calcium: 9.5 mg/dL (ref 8.9–10.3)
Chloride: 105 mmol/L (ref 98–111)
Creatinine, Ser: 1.13 mg/dL — ABNORMAL HIGH (ref 0.44–1.00)
GFR calc Af Amer: 59 mL/min — ABNORMAL LOW (ref >60)
GFR calc non Af Amer: 51 mL/min — ABNORMAL LOW (ref >60)
Glucose, Bld: 168 mg/dL — ABNORMAL HIGH (ref 70–99)
Potassium: 3.9 mmol/L (ref 3.5–5.1)
Sodium: 139 mmol/L (ref 135–145)

## 2020-04-10 LAB — DIGOXIN LEVEL: Digoxin Level: 0.4 ng/mL — ABNORMAL LOW (ref 0.8–2.0)

## 2020-04-10 MED ORDER — TORSEMIDE 20 MG PO TABS: 20.0000 mg | ORAL_TABLET | Freq: Every day | ORAL | 3 refills | Status: DC

## 2020-04-10 MED ORDER — ENTRESTO 97-103 MG PO TABS: 1.0000 | ORAL_TABLET | Freq: Two times a day (BID) | ORAL | 3 refills | Status: DC

## 2020-04-10 NOTE — Progress Notes (Signed)
Height:     Weight: BMI:  Today's Date:  STOP BANG RISK ASSESSMENT S (snore) Have you been told that you snore?     YES   T (tired) Are you often tired, fatigued, or sleepy during the day?   YES  O (obstruction) Do you stop breathing, choke, or gasp during sleep? YES   P (pressure) Do you have or are you being treated for high blood pressure? YES   B (BMI) Is your body index greater than 35 kg/m? YES   A (age) Are you 66 years old or older? YES   N (neck) Do you have a neck circumference greater than 16 inches?   NO   G (gender) Are you a female? NO   TOTAL STOP/BANG "YES" ANSWERS                                                                        For Office Use Only              Procedure Order Form    YES to 3+ Stop Bang questions OR two clinical symptoms - patient qualifies for WatchPAT (CPT 95800)      Clinical Notes: Will consult Sleep Specialist and refer for management of therapy due to patient increased risk of Sleep Apnea. Ordering a sleep study due to the following two clinical symptoms: Excessive daytime sleepiness G47.10 / Gastroesophageal reflux K21.9 / Nocturia R35.1 / Morning Headaches G44.221 / Difficulty concentrating R41.840 / Memory problems or poor judgment G31.84 / Personality changes or irritability R45.4 / Loud snoring R06.83 / Depression F32.9 / Unrefreshed by sleep G47.8 / Impotence N52.9 / History of high blood pressure R03.0 / Insomnia G47.00   Which test do you need, WP1 or WP300???  . Do you have access to a smart device containing the app stores?  If YES, then -->WP1   If NO, then  --->WP300

## 2020-04-10 NOTE — Patient Instructions (Signed)
INCREASE Torsemide 20mg  Daily  INCREASE Entresto 97/103 Twice Daily  Labs done today, your results will be available in MyChart, we will contact you for abnormal readings.  Return for repeat labs in: 7-14 days  Follow up with our [facility] in 1-2 months  Your physician has recommended that you have a sleep study. This test records several body functions during sleep, including: brain activity, eye movement, oxygen and carbon dioxide blood levels, heart rate and rhythm, breathing rate and rhythm, the flow of air through your mouth and nose, snoring, body muscle movements, and chest and belly movement. THEY WILL CONTACT YOU to schedule your appointment  If you have any questions or concerns before your next appointment please send [place] a message through [place] or call our office at [phone].    TO LEAVE A MESSAGE FOR THE NURSE SELECT OPTION 2, PLEASE LEAVE A MESSAGE INCLUDING: . YOUR NAME . DATE OF BIRTH . CALL BACK NUMBER . REASON FOR CALL**this is important as we prioritize the call backs  [place] AS LONG AS YOU CALL BEFORE 4:00 PM   At the [facility], you and your health needs are our priority. As part of our continuing mission to provide you with exceptional heart care, we have created designated Provider Care Teams. These Care Teams include your primary Cardiologist (physician) and Advanced Practice Providers (APPs- Physician Assistants and Nurse Practitioners) who all work together to provide you with the care you need, when you need it.   You may see any of the following providers on your designated Care Team at your next follow up: Marland Kitchen Dr Glori Bickers . Dr [name] . Darrick Grinder, NP . Lyda Jester, PA . Audry Riles, [name]   Please be sure to bring in all your medications bottles to every appointment.

## 2020-04-12 NOTE — Progress Notes (Signed)
PCP: Colon Branch, MD  [facility] Cardiology: Dr. Aundra Dubin  64 y.o. with CAD s/p PCI in 2010, chronic combined systolic and diastolic HF (EF 32-44% in 2018), atrial fibrillation on chronic anticoagulation w/ Xarelto, HTN, and hypothyroidism was diagnosed w/ COVID-19 in Sep 2020 and treated at [facility] c/b PNA. She was discharged and readmitted back to [facility] 10/9-10/13/20 for gastroenteritis, felt to be a sequela of COVID-19. She was managed w/ supportive care and discharged home. Had f/u with PCP 10/22 and endorsed worsening LLQ pain leading to abdominal CT which showed acute diverticulitis. She was started on outpatient antibiotics, cipro + flagyl but symptoms failed to improve, prompting her to seek emergency medical care at [facility].  Initial EKG in the ED showed afib w/ RVR for which general cardiology was consulted. She was started on IV dilt for rate control but later required treatment w/ IV amiodarone due to persistent rapid ventricular response. Hospital course c/b volume overload, requiring IV diuretics. 2D echo was obtained and showed worsening LVEF, now 25-30% w/ moderate MR. She underwent subsequent R/LHC in 11/20 which demonstrated widely patent coronaries. RHC showed elevated mean RA pressure at 22, PWP 43, LVEDP 45 and low CO. CI by Fick 1.6. She underwent TEE-guided DCCV, TEE showed EF 15% with moderate RV dysfunction and moderate MR.  She went back into rapid atrial fibrillation.  She failed additional cardioversions.  Despite amiodarone gtt, HR remained in the 140s-150s.  Finally, she underwent AV nodal ablation with BiV pacing due to inability to control atrial fibrillation. While in the hospital, she was on milrinone gtt for a period of time, but we were able to taper it off. She was discharged home after medication optimization.   Echo in 2/21 showed EF up to 40-45%, mild diffuse hypokinesis, RV low normal function.   Patient presents for followup of CHF.  Weight is  down 2 lbs.  She is short of breath chronically after walking 5-6 minutes.  No dyspnea walking into the office today.  She has chronic orthopnea, sleeps on 2 pillows.  No chest pain.  Rare lightheadedness.    ECG (personally reviewed): Atrial fibrillation, BiV pacing   Labs (11/20): digoxin level 1.4, creatinine 1.81, K 4.8, hgb 13.9 Labs (1/21): LDL 91, K 3.7, creatinine 1.32 Labs (2/21): K 5.1, creatinine 1.37, TSH normal, digoxin 0.4 Labs (5/21): LDL 68, digoxin 0.3 Labs (6/21): K 4.4, creatinine 1.21  Medtronic device interrogation: >99% BiV pacing, 100% atrial fibrillation, fluid index > threshold.   PMH: 1. Hypothyroidism 2. Single kidney s/p nephrectomy 3. Atrial fibrillation: Initially diagnosed in 2018.  She was on sotalol at one point to maintain NSR.  - Uncontrollable atrial fibrillation during 11/20 admission, patient eventually had AV nodal ablation with CRT-P implantation.  4. H/o achalasia 5. GERD 6. Fibromyalgia 7. H/o diverticulitis in 10/20 8. COVID-19 in 9/20.  9. H/o cervical cancer: cryotherapy.  10. Degenerative disc disease.  11. Chronic systolic CHF: Nonischemic cardiomyopathy, possibly tachycardia-mediated in setting of uncontrolled atrial fibrillation. Medtronic CRT-P device.  - Echo (2018): EF 45-50%.  - Echo (11/20): EF 25-30%, mildly decreased RV systolic function, moderate MR.  - TEE (11/20): EF 15%, moderately decreased RV systolic function, moderate MR.  - Cardiac MRI (11/20): EF 34%, RV EF 30%, no LGE (done after AV nodal ablation and BiV pacing).  - RHC (11/20): mean RA 22, mean PCWP 43, LVEDP 45, CI 1.6 (Fick) and 2.7 (thermodilution).  - Echo (2/21): EF 40-45%, mild diffuse  hypokinesis, low normal RV systolic function.  12. CAD: PCI in 2010.  - LHC (11/20): No significant coronary disease.   Social History   Socioeconomic History  . Marital status: Married    Spouse name: Not on file  . Number of children: 3  . Years of education: Not on  file  . Highest education level: Not on file  Occupational History  . 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 Smoking status: Former Smoker    Packs/day: 2.00    Years: 28.00    Pack years: 56.00    Types: Cigarettes    Quit date: 2000    Years since quitting: 21.6  . Smokeless tobacco: Never Used  Vaping Use  . Vaping Use: Never used  Substance and Sexual Activity  . Alcohol use: Yes    Alcohol/week: 5.0 standard drinks    Types: 5 Cans of beer per week    Comment: 07/27/2017 "3-4 drinks//month"  . Drug use: No  . Sexual activity: Not Currently    Comment: 1ST INTERCOURSE- 16, PARTNERS - 2  Other Topics Concern  . Not on file  Social History Narrative   Original from [place]   3 children, 2 alive   Household --pt and husband    Social Determinants of Radio broadcast assistant Strain:   . Difficulty of Paying Living Expenses: Not on file  Food Insecurity:   . Worried About Charity fundraiser in the Last Year: Not on file  . Ran Out of Food in the Last Year: Not on file  Transportation Needs:   . Lack of Transportation (Medical): Not on file  . Lack of Transportation (Non-Medical): Not on file  Physical Activity:   . Days of Exercise per Week: Not on file  . Minutes of Exercise per Session: Not on file  Stress:   . Feeling of Stress : Not on file  Social Connections:   . Frequency of Communication with Friends and Family: Not on file  . Frequency of Social Gatherings with Friends and Family: Not on file  . Attends Religious Services: Not on file  . Active Member of Clubs or Organizations: Not on file  . Attends Archivist Meetings: Not on file  . Marital Status: Not on file  Intimate Partner Violence:   . Fear of Current or Ex-Partner: Not on file  . Emotionally Abused: Not on file  . Physically Abused: Not on file  . Sexually Abused: Not on file   Family History  Problem Relation Age of Onset  . Breast cancer Other        aunt   . CAD  Brother   . Lung cancer Mother        F and M  . Diabetes Father        F and mother   . CAD Father   . Lung cancer Father   . Stroke Sister   . Colon cancer Neg Hx    ROS: All systems reviewed and negative except as per HPI.   Current Outpatient Medications  Medication Sig Dispense Refill  . alendronate (FOSAMAX) 70 MG tablet Take 1 tablet (70 mg total) by mouth every 7 (seven) days. Take with a full glass of water on an empty stomach. 12 tablet 3  . allopurinol (ZYLOPRIM) 100 MG tablet TAKE ONE TABLET BY MOUTH DAILY 90 tablet 0  . atorvastatin (LIPITOR) 40 MG tablet TAKE 1 AND 1/2 TABLET BY MOUTH AT  BEDTIME 135 tablet 0  . carvedilol (COREG) 6.25 MG tablet Take 1 tablet (6.25 mg total) by mouth 2 (two) times daily with a meal. 180 tablet 1  . Cholecalciferol (VITAMIN D3) 5000 UNITS CAPS Take 5,000 Units by mouth daily.     . clobetasol cream (TEMOVATE) 0.05 %     . cyclobenzaprine (FLEXERIL) 10 MG tablet Take 1 tablet (10 mg total) by mouth 2 (two) times daily as needed for muscle spasms. 60 tablet 1  . digoxin (LANOXIN) 0.125 MG tablet Take 0.5 tablets (0.0625 mg total) by mouth every other day. 22 tablet 3  . DULoxetine (CYMBALTA) 60 MG capsule TAKE ONE CAPSULE BY MOUTH DAILY 90 capsule 0  . ketoconazole (NIZORAL) 2 % cream Apply 1 application topically daily. 60 g 0  . levothyroxine (SYNTHROID) 125 MCG tablet Take 1 tablet (125 mcg total) by mouth daily before breakfast. 90 tablet 0  . metFORMIN (GLUCOPHAGE) 500 MG tablet Take 1 tablet (500 mg total) by mouth 2 (two) times daily with a meal. 180 tablet 1  . omeprazole (PRILOSEC) 20 MG capsule Take 1 capsule (20 mg total) by mouth 2 (two) times daily before a meal. 180 capsule 3  . rivaroxaban (XARELTO) 20 MG TABS tablet Take 1 tablet (20 mg total) by mouth daily with supper. 90 tablet 3  . spironolactone (ALDACTONE) 25 MG tablet Take 1 tablet (25 mg total) by mouth daily. 30 tablet 11  . torsemide (DEMADEX) 20 MG tablet Take 1  tablet (20 mg total) by mouth daily. 30 tablet 3  . zolpidem (AMBIEN) 5 MG tablet Take 1 tablet (5 mg total) by mouth at bedtime as needed. for sleep 90 tablet 1  . sacubitril-valsartan (ENTRESTO) 97-103 MG Take 1 tablet by mouth 2 (two) times daily. 60 tablet 3   No current facility-administered medications for this encounter.   BP (!) 154/96   Pulse 86   Wt 101.5 kg (223 lb 12.8 oz)   SpO2 98%   BMI 37.24 kg/m  General: NAD Neck:JVP 8 cm, no thyromegaly or thyroid nodule.  Lungs: Clear to auscultation bilaterally with normal respiratory effort. CV: Nondisplaced PMI.  Heart regular S1/S2, no S3/S4, no murmur.  No peripheral edema.  No carotid bruit.  Normal pedal pulses.  Abdomen: Soft, nontender, no hepatosplenomegaly, no distention.  Skin: Intact without lesions or rashes.  Neurologic: Alert and oriented x 3.  Psych: Normal affect. Extremities: No clubbing or cyanosis.  HEENT: Normal.   Assessment/Plan: 1. Chronic systolic CHF: Nonischemic cardiomyopathy. Echo in 2018 with EF 45-50%. Echo in 11/20 with EF 25-30%, diffuse hypokinesis, mildly decreased RV systolic function, moderate MR. LHC/RHC in 11/20 with no significant coronary disease, markedly elevated filling pressures and low cardiac output (CI 1.6). 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 Cardiac MRI 11/20 showed LVEF 34%, RVEF 30%, no LGE => perhaps pointing to tachy-mediated CMP as more likely etiology.  Initially required inotropic support w/ milrinone but able to titrate off.  Now s/p AV nodal ablation with MDT CRT-P.  Echo in 2/21 showed EF up to 40-45%.  NYHA class II-III symptoms, mild volume overload by exam and Optivol.   - Increase torsemide to 20 mg daily.  - Increase Entresto to 97/103 bid.  BMET today and in 10 days.   - Continue spironolactone to 25 daily.  - Continue digoxin 0.0625 mg daily.   Check digoxin level.  -  Continue Coreg 6.25 mg bid.   2. Atrial fibrillation: Persistent atrial fibrillation, it appears, at least since 9/20 when she was admitted with COVID-19. Prior, she had paroxysmal atrial fibrillation and was maintained on sotalol. She is now off sotalol.  Unable to cardiovert or rate control atrial fibrillation. Therefore, she had AV nodal ablation with BiV pacing.  - Now off amiodarone.   - Continue Xarelto 20 mg daily.  3. Mitral regurgitation: Functional MR, moderate on TEE.  Rate was controlled on cardiac MRI, MR was less impressive.  Minimal MR on echo in 2/21.  4. CKD stage 3: BMET today.  5. Suspect OSA: I will arrange for sleep study.   Followup with NP/PA in 1 month.   [name] 04/12/2020

## 2020-04-15 DIAGNOSIS — M25511 Pain in right shoulder: Secondary | ICD-10-CM | POA: Diagnosis not present

## 2020-04-21 ENCOUNTER — Ambulatory Visit (HOSPITAL_COMMUNITY)
Admission: RE | Admit: 2020-04-21 | Discharge: 2020-04-21 | Disposition: A | Discharge status: Home / Self Care | Payer: Medicare Other | Source: Ambulatory Visit | Attending: Internal Medicine | Admitting: Internal Medicine

## 2020-04-21 ENCOUNTER — Other Ambulatory Visit: Payer: Self-pay

## 2020-04-21 DIAGNOSIS — I509 Heart failure, unspecified: Secondary | ICD-10-CM | POA: Diagnosis not present

## 2020-04-21 LAB — BASIC METABOLIC PANEL
Anion gap: 9 (ref 5–15)
BUN: 14 mg/dL (ref 8–23)
CO2: 25 mmol/L (ref 22–32)
Calcium: 9.2 mg/dL (ref 8.9–10.3)
Chloride: 106 mmol/L (ref 98–111)
Creatinine, Ser: 1.19 mg/dL — ABNORMAL HIGH (ref 0.44–1.00)
GFR calc Af Amer: 55 mL/min — ABNORMAL LOW (ref >60)
GFR calc non Af Amer: 48 mL/min — ABNORMAL LOW (ref >60)
Glucose, Bld: 189 mg/dL — ABNORMAL HIGH (ref 70–99)
Potassium: 4.2 mmol/L (ref 3.5–5.1)
Sodium: 140 mmol/L (ref 135–145)

## 2020-04-22 DIAGNOSIS — M25511 Pain in right shoulder: Secondary | ICD-10-CM | POA: Diagnosis not present

## 2020-04-23 DIAGNOSIS — S62114S Nondisplaced fracture of triquetrum [cuneiform] bone, right wrist, sequela: Secondary | ICD-10-CM | POA: Diagnosis not present

## 2020-04-27 ENCOUNTER — Other Ambulatory Visit (HOSPITAL_COMMUNITY): Payer: Self-pay | Admitting: Cardiology

## 2020-04-28 ENCOUNTER — Telehealth: Payer: Self-pay | Admitting: Internal Medicine

## 2020-04-28 DIAGNOSIS — K529 Noninfective gastroenteritis and colitis, unspecified: Secondary | ICD-10-CM | POA: Diagnosis not present

## 2020-04-28 DIAGNOSIS — R531 Weakness: Secondary | ICD-10-CM | POA: Diagnosis not present

## 2020-04-28 NOTE — Telephone Encounter (Signed)
Prescription sent

## 2020-04-28 NOTE — Telephone Encounter (Signed)
Ambien refill.   Last OV: 03/18/2020 Last Fill: 01/11/2019 #90 and 1RF Pt sig: 1 tab qhs prn UDS: Ambien only

## 2020-05-05 DIAGNOSIS — M25561 Pain in right knee: Secondary | ICD-10-CM | POA: Diagnosis not present

## 2020-05-06 NOTE — Addendum Note (Signed)
Addended by: Kelle Darting A on: 05/06/2020 03:00 PM   Modules accepted: Orders

## 2020-05-07 ENCOUNTER — Other Ambulatory Visit: Payer: Medicare Other

## 2020-05-12 ENCOUNTER — Inpatient Hospital Stay (HOSPITAL_COMMUNITY)
Admission: RE | Admit: 2020-05-12 | Discharge: 2020-05-12 | Disposition: A | Discharge status: Home / Self Care | Payer: Medicare Other | Source: Ambulatory Visit

## 2020-05-13 ENCOUNTER — Telehealth: Payer: Self-pay | Admitting: Internal Medicine

## 2020-05-13 NOTE — Telephone Encounter (Signed)
-----   Message from [place], [place] sent at 05/12/2020  9:20 AM EDT ----- Regarding: Lab appt Pt missed her lab appt on 05/07/2020, can you reach out to her to reschedule please?   Thank you, [facility]

## 2020-05-13 NOTE — Telephone Encounter (Signed)
Left Voice to scheduled appt

## 2020-05-20 ENCOUNTER — Other Ambulatory Visit: Payer: Self-pay

## 2020-05-20 ENCOUNTER — Other Ambulatory Visit: Payer: Medicare Other

## 2020-05-20 DIAGNOSIS — E039 Hypothyroidism, unspecified: Secondary | ICD-10-CM | POA: Diagnosis not present

## 2020-05-20 LAB — TSH: TSH: 1.33 mIU/L (ref 0.40–4.50)

## 2020-05-21 MED ORDER — LEVOTHYROXINE SODIUM 125 MCG PO TABS: 125.0000 ug | ORAL_TABLET | Freq: Every day | ORAL | 1 refills | Status: DC

## 2020-05-21 NOTE — Addendum Note (Signed)
Addended byDamita Dunnings D on: 05/21/2020 03:52 PM   Modules accepted: Orders

## 2020-05-28 DIAGNOSIS — R531 Weakness: Secondary | ICD-10-CM | POA: Diagnosis not present

## 2020-05-28 DIAGNOSIS — K529 Noninfective gastroenteritis and colitis, unspecified: Secondary | ICD-10-CM | POA: Diagnosis not present

## 2020-06-02 DIAGNOSIS — H2513 Age-related nuclear cataract, bilateral: Secondary | ICD-10-CM | POA: Diagnosis not present

## 2020-06-02 DIAGNOSIS — G510 Bell's palsy: Secondary | ICD-10-CM | POA: Diagnosis not present

## 2020-06-02 DIAGNOSIS — E119 Type 2 diabetes mellitus without complications: Secondary | ICD-10-CM | POA: Diagnosis not present

## 2020-06-02 DIAGNOSIS — H524 Presbyopia: Secondary | ICD-10-CM | POA: Diagnosis not present

## 2020-06-02 LAB — HM DIABETES EYE EXAM

## 2020-06-03 ENCOUNTER — Encounter: Payer: Self-pay | Admitting: Internal Medicine

## 2020-06-03 NOTE — Progress Notes (Signed)
PCP: Colon Branch, MD  HF Cardiology: Dr. Aundra Dubin  64 y.o. with CAD s/p PCI in 2010, chronic combined systolic and diastolic HF (EF 61-44% in 2018), atrial fibrillation on chronic anticoagulation w/ Xarelto, HTN, and hypothyroidism was diagnosed w/ COVID-19 in Sep 2020 and treated at [place] infection c/b PNA. She was discharged and readmitted back to [facility] 10/9-10/13/20 for gastroenteritis, felt to be a sequela of COVID-19. She was managed w/ supportive care and discharged home. Had f/u with PCP 10/22 and endorsed worsening LLQ pain leading to abdominal CT which showed acute diverticulitis. She was started on outpatient antibiotics, cipro + flagyl but symptoms failed to improve, prompting her to seek emergency medical care at [facility].  Initial EKG in the ED showed afib w/ RVR for which general cardiology was consulted. She was started on IV dilt for rate control but later required treatment w/ IV amiodarone due to persistent rapid ventricular response. Hospital course c/b volume overload, requiring IV diuretics. 2D echo was obtained and showed worsening LVEF, now 25-30% w/ moderate MR. She underwent subsequent R/LHC in 11/20 which demonstrated widely patent coronaries. RHC showed elevated mean RA pressure at 22, PWP 43, LVEDP 45 and low CO. CI by Fick 1.6. She underwent TEE-guided DCCV, TEE showed EF 15% with moderate RV dysfunction and moderate MR.  She went back into rapid atrial fibrillation.  She failed additional cardioversions.  Despite amiodarone gtt, HR remained in the 140s-150s.  Finally, she underwent AV nodal ablation with BiV pacing due to inability to control atrial fibrillation. While in the hospital, she was on milrinone gtt for a period of time, but we were able to taper it off. She was discharged home after medication optimization.   Echo in 2/21 showed EF up to 40-45%, mild diffuse hypokinesis, RV low normal function.   Today she returns for HF follow up.Overall  feeling fine. Denies SOB/PND/Orthopnea. Appetite ok. No fever or chills. Weight at home has been stable. Taking all medications.  Labs (11/20): digoxin level 1.4, creatinine 1.81, K 4.8, hgb 13.9 Labs (1/21): LDL 91, K 3.7, creatinine 1.32 Labs (2/21): K 5.1, creatinine 1.37, TSH normal, digoxin 0.4 Labs (5/21): LDL 68, digoxin 0.3 Labs (6/21): K 4.4, creatinine 1.21  Medtronic device interrogation: Activity ~ 4-6 hours, A fib 100%, Pacing 100%, and No VT, Fluid index well above threshold.   PMH: 1. Hypothyroidism 2. Single kidney s/p nephrectomy 3. Atrial fibrillation: Initially diagnosed in 2018.  She was on sotalol at one point to maintain NSR.  - Uncontrollable atrial fibrillation during 11/20 admission, patient eventually had AV nodal ablation with CRT-P implantation.  4. H/o achalasia 5. GERD 6. Fibromyalgia 7. H/o diverticulitis in 10/20 8. COVID-19 in 9/20.  9. H/o cervical cancer: cryotherapy.  10. Degenerative disc disease.  11. Chronic systolic CHF: Nonischemic cardiomyopathy, possibly tachycardia-mediated in setting of uncontrolled atrial fibrillation. Medtronic CRT-P device.  - Echo (2018): EF 45-50%.  - Echo (11/20): EF 25-30%, mildly decreased RV systolic function, moderate MR.  - TEE (11/20): EF 15%, moderately decreased RV systolic function, moderate MR.  - Cardiac MRI (11/20): EF 34%, RV EF 30%, no LGE (done after AV nodal ablation and BiV pacing).  - RHC (11/20): mean RA 22, mean PCWP 43, LVEDP 45, CI 1.6 (Fick) and 2.7 (thermodilution).  - Echo (2/21): EF 40-45%, mild diffuse hypokinesis, low normal RV systolic function.  12. CAD: PCI in 2010.  - LHC (11/20): No significant coronary disease.   Social History  Socioeconomic History  . Marital status: Married    Spouse name: Not on file  . Number of children: 3  . Years of education: Not on file  . Highest education level: Not on file  Occupational History  . 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 Smoking  status: Former Smoker    Packs/day: 2.00    Years: 28.00    Pack years: 56.00    Types: Cigarettes    Quit date: 2000    Years since quitting: 21.8  . Smokeless tobacco: Never Used  Vaping Use  . Vaping Use: Never used  Substance and Sexual Activity  . Alcohol use: Yes    Alcohol/week: 5.0 standard drinks    Types: 5 Cans of beer per week    Comment: 07/27/2017 "3-4 drinks//month"  . Drug use: No  . Sexual activity: Not Currently    Comment: 1ST INTERCOURSE- 16, PARTNERS - 2  Other Topics Concern  . Not on file  Social History Narrative   Original from [place]   3 children, 2 alive   Household --pt and husband    Social Determinants of Radio broadcast assistant Strain:   . Difficulty of Paying Living Expenses: Not on file  Food Insecurity:   . Worried About Charity fundraiser in the Last Year: Not on file  . Ran Out of Food in the Last Year: Not on file  Transportation Needs:   . Lack of Transportation (Medical): Not on file  . Lack of Transportation (Non-Medical): Not on file  Physical Activity:   . Days of Exercise per Week: Not on file  . Minutes of Exercise per Session: Not on file  Stress:   . Feeling of Stress : Not on file  Social Connections:   . Frequency of Communication with Friends and Family: Not on file  . Frequency of Social Gatherings with Friends and Family: Not on file  . Attends Religious Services: Not on file  . Active Member of Clubs or Organizations: Not on file  . Attends Archivist Meetings: Not on file  . Marital Status: Not on file  Intimate Partner Violence:   . Fear of Current or Ex-Partner: Not on file  . Emotionally Abused: Not on file  . Physically Abused: Not on file  . Sexually Abused: Not on file   Family History  Problem Relation Age of Onset  . Breast cancer Other        aunt   . CAD Brother   . Lung cancer Mother        F and M  . Diabetes Father        F and mother   . CAD Father   . Lung cancer  Father   . Stroke Sister   . Colon cancer Neg Hx    ROS: All systems reviewed and negative except as per HPI.   Current Outpatient Medications  Medication Sig Dispense Refill  . alendronate (FOSAMAX) 70 MG tablet Take 1 tablet (70 mg total) by mouth every 7 (seven) days. Take with a full glass of water on an empty stomach. 12 tablet 3  . allopurinol (ZYLOPRIM) 100 MG tablet TAKE ONE TABLET BY MOUTH DAILY 90 tablet 0  . atorvastatin (LIPITOR) 40 MG tablet TAKE 1 AND 1/2 TABLET BY MOUTH AT BEDTIME 135 tablet 0  . carvedilol (COREG) 6.25 MG tablet TAKE ONE TABLET BY MOUTH TWICE A DAY WITH MEALS 180 tablet 3  .  Cholecalciferol (VITAMIN D3) 5000 UNITS CAPS Take 5,000 Units by mouth daily.     . clobetasol cream (TEMOVATE) 0.05 %     . cyclobenzaprine (FLEXERIL) 10 MG tablet Take 1 tablet (10 mg total) by mouth 2 (two) times daily as needed for muscle spasms. 60 tablet 1  . digoxin (LANOXIN) 0.125 MG tablet Take 0.5 tablets (0.0625 mg total) by mouth every other day. 22 tablet 3  . DULoxetine (CYMBALTA) 60 MG capsule TAKE ONE CAPSULE BY MOUTH DAILY 90 capsule 0  . ketoconazole (NIZORAL) 2 % cream Apply 1 application topically daily. 60 g 0  . levothyroxine (SYNTHROID) 125 MCG tablet Take 1 tablet (125 mcg total) by mouth daily before breakfast. 90 tablet 1  . metFORMIN (GLUCOPHAGE) 500 MG tablet Take 1 tablet (500 mg total) by mouth 2 (two) times daily with a meal. 180 tablet 1  . omeprazole (PRILOSEC) 20 MG capsule Take 1 capsule (20 mg total) by mouth 2 (two) times daily before a meal. 180 capsule 3  . rivaroxaban (XARELTO) 20 MG TABS tablet Take 1 tablet (20 mg total) by mouth daily with supper. 90 tablet 3  . sacubitril-valsartan (ENTRESTO) 97-103 MG Take 1 tablet by mouth 2 (two) times daily. 60 tablet 3  . spironolactone (ALDACTONE) 25 MG tablet Take 1 tablet (25 mg total) by mouth daily. 30 tablet 11  . torsemide (DEMADEX) 20 MG tablet Take 1 tablet (20 mg total) by mouth daily. 30 tablet 3    . zolpidem (AMBIEN) 5 MG tablet TAKE ONE TABLET BY MOUTH AT BEDTIME AS NEEDED 90 tablet 0   No current facility-administered medications for this encounter.   BP 126/83   Pulse 83   Wt 103.1 kg (227 lb 3.2 oz)   SpO2 99%   BMI 37.81 kg/m   Wt Readings from Last 3 Encounters:  06/04/20 103.1 kg (227 lb 3.2 oz)  04/10/20 101.5 kg (223 lb 12.8 oz)  03/18/20 102.2 kg (225 lb 4 oz)    General:  Well appearing. No resp difficulty HEENT: normal Neck: supple. JVP difficult to assess. Carotids 2+ bilat; no bruits. No lymphadenopathy or thryomegaly appreciated. Cor: PMI nondisplaced. Irregular rate & rhythm. No rubs, gallops or murmurs. Lungs: clear Abdomen: obese, soft, nontender, nondistended. No hepatosplenomegaly. No bruits or masses. Good bowel sounds. Extremities: no cyanosis, clubbing, rash, edema Neuro: alert & orientedx3, cranial nerves grossly intact. moves all 4 extremities w/o difficulty. Affect pleasant   Assessment/Plan: 1. Chronic systolic CHF: Nonischemic cardiomyopathy. Echo in 2018 with EF 45-50%. Echo in 11/20 with EF 25-30%, diffuse hypokinesis, mildly decreased RV systolic function, moderate MR. LHC/RHC in 11/20 with no significant coronary disease, markedly elevated filling pressures and low cardiac output (CI 1.6). 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 Cardiac MRI 11/20 showed LVEF 34%, RVEF 30%, no LGE => perhaps pointing to tachy-mediated CMP as more likely etiology.  Initially required inotropic support w/ milrinone but able to titrate off.  Now s/p AV nodal ablation with MDT CRT-P.  Echo in 2/21 showed EF up to 40-45%. NYHA II. Volume status trending up on Optivol. I reached out to the device clinic to follow volume/fluid.  - Continue torsemide 20 mg daily.  - Add farxiga 10 mg daily. Check BMET today and in 7 days.  -Continue Entresto to 97/103  bid. - Continue spironolactone to 25 daily.  -  Continue Coreg 6.25 mg  bid.  -EF 40-45% - stop digoxin.   2. Atrial fibrillation: Persistent atrial fibrillation, it appears, at least since 9/20 when she was admitted with COVID-19. Prior, she had paroxysmal atrial fibrillation and was maintained on sotalol. She is now off sotalol.  Unable to cardiovert or rate control atrial fibrillation. Therefore, she had AV nodal ablation with BiV pacing.  - Now off amiodarone.  Rate controlled. No bleeding issues.  - Continue Xarelto 20 mg daily.  3. Mitral regurgitation: Functional MR, moderate on TEE.  Rate was controlled on cardiac MRI, MR was less impressive.  Minimal MR on echo in 2/21.  4. CKD stage 3: Check BMET and in 7 days.  5. Suspect OSA: Arrange sleep study.   Discussed medications changes. Check BMET today and in 7 days. Follow with Dr Aundra Dubin in 3 months.    Melinda Wollin NP-C  06/04/2020

## 2020-06-04 ENCOUNTER — Other Ambulatory Visit: Payer: Self-pay

## 2020-06-04 ENCOUNTER — Ambulatory Visit (HOSPITAL_COMMUNITY)
Admission: RE | Admit: 2020-06-04 | Discharge: 2020-06-04 | Disposition: A | Discharge status: Home / Self Care | Payer: Medicare Other | Source: Ambulatory Visit | Attending: Adult Health | Admitting: Adult Health

## 2020-06-04 VITALS — BP 126/83 | HR 83 | Wt 227.2 lb

## 2020-06-04 DIAGNOSIS — Z950 Presence of cardiac pacemaker: Secondary | ICD-10-CM | POA: Insufficient documentation

## 2020-06-04 DIAGNOSIS — Z87891 Personal history of nicotine dependence: Secondary | ICD-10-CM | POA: Diagnosis not present

## 2020-06-04 DIAGNOSIS — Z7984 Long term (current) use of oral hypoglycemic drugs: Secondary | ICD-10-CM | POA: Diagnosis not present

## 2020-06-04 DIAGNOSIS — E039 Hypothyroidism, unspecified: Secondary | ICD-10-CM | POA: Insufficient documentation

## 2020-06-04 DIAGNOSIS — I4819 Other persistent atrial fibrillation: Secondary | ICD-10-CM | POA: Diagnosis not present

## 2020-06-04 DIAGNOSIS — I5042 Chronic combined systolic (congestive) and diastolic (congestive) heart failure: Secondary | ICD-10-CM | POA: Diagnosis not present

## 2020-06-04 DIAGNOSIS — Z79890 Hormone replacement therapy (postmenopausal): Secondary | ICD-10-CM | POA: Diagnosis not present

## 2020-06-04 DIAGNOSIS — I130 Hypertensive heart and chronic kidney disease with heart failure and stage 1 through stage 4 chronic kidney disease, or unspecified chronic kidney disease: Secondary | ICD-10-CM | POA: Diagnosis not present

## 2020-06-04 DIAGNOSIS — N183 Chronic kidney disease, stage 3 unspecified: Secondary | ICD-10-CM | POA: Insufficient documentation

## 2020-06-04 DIAGNOSIS — Z79899 Other long term (current) drug therapy: Secondary | ICD-10-CM | POA: Diagnosis not present

## 2020-06-04 DIAGNOSIS — Z905 Acquired absence of kidney: Secondary | ICD-10-CM | POA: Insufficient documentation

## 2020-06-04 DIAGNOSIS — Z8616 Personal history of COVID-19: Secondary | ICD-10-CM | POA: Diagnosis not present

## 2020-06-04 DIAGNOSIS — I4821 Permanent atrial fibrillation: Secondary | ICD-10-CM

## 2020-06-04 DIAGNOSIS — Z7901 Long term (current) use of anticoagulants: Secondary | ICD-10-CM | POA: Diagnosis not present

## 2020-06-04 DIAGNOSIS — I5022 Chronic systolic (congestive) heart failure: Secondary | ICD-10-CM | POA: Diagnosis not present

## 2020-06-04 DIAGNOSIS — I340 Nonrheumatic mitral (valve) insufficiency: Secondary | ICD-10-CM | POA: Insufficient documentation

## 2020-06-04 DIAGNOSIS — Z8249 Family history of ischemic heart disease and other diseases of the circulatory system: Secondary | ICD-10-CM | POA: Insufficient documentation

## 2020-06-04 DIAGNOSIS — I428 Other cardiomyopathies: Secondary | ICD-10-CM

## 2020-06-04 DIAGNOSIS — N1831 Chronic kidney disease, stage 3a: Secondary | ICD-10-CM

## 2020-06-04 LAB — BASIC METABOLIC PANEL
Anion gap: 10 (ref 5–15)
BUN: 13 mg/dL (ref 8–23)
CO2: 22 mmol/L (ref 22–32)
Calcium: 9.1 mg/dL (ref 8.9–10.3)
Chloride: 108 mmol/L (ref 98–111)
Creatinine, Ser: 0.98 mg/dL (ref 0.44–1.00)
GFR, Estimated: >60 mL/min (ref >60)
Glucose, Bld: 136 mg/dL — ABNORMAL HIGH (ref 70–99)
Potassium: 4 mmol/L (ref 3.5–5.1)
Sodium: 140 mmol/L (ref 135–145)

## 2020-06-04 MED ORDER — DAPAGLIFLOZIN PROPANEDIOL 10 MG PO TABS
10.0000 mg | ORAL_TABLET | Freq: Every day | ORAL | 3 refills | Status: DC
Start: 2020-06-04 — End: 2020-10-01

## 2020-06-04 NOTE — Patient Instructions (Signed)
STOP Digoxin  START Farxiga 10mg  Daily  Labs done today, your results will be available in MyChart, we will contact you for abnormal readings.  Your physician recommends that you return for   Lab work in 7-10 days   Follow up appointment in 2-3 months   If you have any questions or concerns before your next appointment please send [place] a message through [place] or call our office at [phone].    TO LEAVE A MESSAGE FOR THE NURSE SELECT OPTION 2, PLEASE LEAVE A MESSAGE INCLUDING: . YOUR NAME . DATE OF BIRTH . CALL BACK NUMBER . REASON FOR CALL**this is important as we prioritize the call backs  [place] AS LONG AS YOU CALL BEFORE 4:00 PM  At the [facility], you and your health needs are our priority. As part of our continuing mission to provide you with exceptional heart care, we have created designated Provider Care Teams. These Care Teams include your primary Cardiologist (physician) and Advanced Practice Providers (APPs- Physician Assistants and Nurse Practitioners) who all work together to provide you with the care you need, when you need it.   You may see any of the following providers on your designated Care Team at your next follow up: Marland Kitchen Dr Glori Bickers . Dr [name] . Darrick Grinder, NP . Lyda Jester, PA . Audry Riles, [name]   Please be sure to bring in all your medications bottles to every appointment.

## 2020-06-10 ENCOUNTER — Telehealth: Payer: Self-pay

## 2020-06-10 DIAGNOSIS — I5022 Chronic systolic (congestive) heart failure: Secondary | ICD-10-CM

## 2020-06-10 NOTE — Telephone Encounter (Signed)
Carelink alert received 06/09/20 for increased Optivol, impedance falling below baseline. Previous hx seen, attempted to enroll patient in [facility], was unable to reach patient per Sharman Cheek, RN note on 12/04/19.   Attempted to call patient to discuss s/s, medication compliance. No answer, LMOVM.

## 2020-06-11 NOTE — Telephone Encounter (Signed)
2nd attempt to call patient. Phone goes straight to VM. No other numbers on file.   LMOVM.

## 2020-06-17 ENCOUNTER — Other Ambulatory Visit (HOSPITAL_COMMUNITY): Payer: Medicare Other

## 2020-06-18 ENCOUNTER — Ambulatory Visit (INDEPENDENT_AMBULATORY_CARE_PROVIDER_SITE_OTHER): Payer: Medicare Other | Admitting: Internal Medicine

## 2020-06-18 ENCOUNTER — Other Ambulatory Visit: Payer: Self-pay

## 2020-06-18 ENCOUNTER — Encounter: Payer: Self-pay | Admitting: Internal Medicine

## 2020-06-18 VITALS — BP 136/84 | HR 93 | Temp 98.0°F | Resp 18 | Ht 65.0 in | Wt 219.1 lb

## 2020-06-18 DIAGNOSIS — D649 Anemia, unspecified: Secondary | ICD-10-CM

## 2020-06-18 DIAGNOSIS — E039 Hypothyroidism, unspecified: Secondary | ICD-10-CM

## 2020-06-18 DIAGNOSIS — E1159 Type 2 diabetes mellitus with other circulatory complications: Secondary | ICD-10-CM | POA: Diagnosis not present

## 2020-06-18 MED ORDER — ALENDRONATE SODIUM 70 MG PO TABS: 70.0000 mg | ORAL_TABLET | ORAL | 3 refills | Status: DC

## 2020-06-18 NOTE — Patient Instructions (Addendum)
Check the  blood pressure  BP GOAL is between 110/65 and  135/85. If it is consistently higher or lower, let me know   GO TO THE LAB : Get the blood work     [place], [place] back for a physical exam in 4 months, fasting

## 2020-06-18 NOTE — Progress Notes (Signed)
Pre visit review using our clinic review tool, if applicable. No additional management support is needed unless otherwise documented below in the visit note. 

## 2020-06-18 NOTE — Progress Notes (Signed)
Subjective:    Patient ID: Melinda Mckinney, female    DOB: October 21, 1955, 64 y.o.   MRN: [id number]  DOS:  06/18/2020 Type of visit - description: Follow-up, here with her husband. Since the last visit he is doing well. Saw cardiology, note reviewed.   Wt Readings from Last 3 Encounters:  06/18/20 219 lb 2 oz (99.4 kg)  06/04/20 227 lb 3.2 oz (103.1 kg)  04/10/20 223 lb 12.8 oz (101.5 kg)     Review of Systems History of fibromyalgia, aches and pains as well as fatigue at baseline or better. Denies chest pain or difficulty breathing No blood in the stools or blood in the urine Trying to eat healthier, has lost few pounds.  Past Medical History:  Diagnosis Date  . Anemia   . Anxiety and depression   . Asthma   . Borderline diabetes   . CAD (coronary artery disease) ~2009   stent   . Cervical cancer ([place])    "stage 1; had cryotherapy"  . Chronic lower back pain   . Complete heart block ([place]) 06/2019   s/p AV nodal ablation and CRT-P by Dr Lovena Le  . DDD (degenerative disc disease), lumbar    Dr. Nelva Bush, recommends lumbar epidural steroid injections L5-S1 to the right  . Depression   . Fibromyalgia   . GERD (gastroesophageal reflux disease)   . High cholesterol   . History of hiatal hernia   . History of kidney stones   . Hypertension   . Hypothyroidism   . Osteoporosis   . Permanent atrial fibrillation ([place]) 07/2017   s/p AV nodal ablation  . Thyroid disease     Past Surgical History:  Procedure Laterality Date  . AV NODE ABLATION N/A 06/24/2019   Procedure: AV NODE ABLATION;  Surgeon: Evans Lance, MD;  Location: [place] [facility];  Service: Cardiovascular;  Laterality: N/A;  . BIV PACEMAKER INSERTION CRT-P N/A 06/24/2019   Procedure: BIV PACEMAKER INSERTION CRT-P;  Surgeon: Evans Lance, MD;  Location: [place] [facility];  Service: Cardiovascular;  Laterality: N/A;  . CARDIAC CATHETERIZATION  ~ 2008  . CARDIOVERSION N/A 08/05/2017   Procedure: CARDIOVERSION;   Surgeon: Evans Lance, MD;  Location: [place];  Service: Cardiovascular;  Laterality: N/A;  . CARDIOVERSION N/A 08/24/2017   Procedure: CARDIOVERSION;  Surgeon: Sanda Klein, MD;  Location: [place] [facility];  Service: Cardiovascular;  Laterality: N/A;  . CARDIOVERSION N/A 06/19/2019   Procedure: CARDIOVERSION;  Surgeon: Larey Dresser, MD;  Location: [place];  Service: Cardiovascular;  Laterality: N/A;  . CARDIOVERSION N/A 06/21/2019   Procedure: CARDIOVERSION;  Surgeon: Larey Dresser, MD;  Location: [facility];  Service: Cardiovascular;  Laterality: N/A;  . Ava; ~ 1978/1979; 1984  . ESOPHAGOMYOTOMY  2009  . GYNECOLOGIC CRYOSURGERY  X 2   "stage 1 cancer"  . HERNIA REPAIR  X 6   "all in my stomach" (07/27/2017)  . KNEE ARTHROSCOPY Right   . NEPHRECTOMY Right 1991   in [place], due to fibrosis and lithiasis  . RIGHT/LEFT HEART CATH AND CORONARY ANGIOGRAPHY N/A 06/17/2019   Procedure: RIGHT/LEFT HEART CATH AND CORONARY ANGIOGRAPHY;  Surgeon: Lorretta Harp, MD;  Location: [place] [facility];  Service: Cardiovascular;  Laterality: N/A;  . TEE WITHOUT CARDIOVERSION N/A 06/19/2019   Procedure: TRANSESOPHAGEAL ECHOCARDIOGRAM (TEE);  Surgeon: Larey Dresser, MD;  Location: [place];  Service: Cardiovascular;  Laterality: N/A;  . TUBAL LIGATION  Allergies as of 06/18/2020      Reactions   Penicillins Swelling, Rash   Has patient had a PCN reaction causing immediate rash, facial/tongue/throat swelling, SOB or lightheadedness with hypotension: No Has patient had a PCN reaction causing severe rash involving mucus membranes or skin necrosis: No Has patient had a PCN reaction that required hospitalization: No Has patient had a PCN reaction occurring within the last 10 years: No If all of the above answers are "NO", then may proceed with Cephalosporin use.      Medication List       Accurate as of June 18, 2020 11:59 PM. If you have any questions, ask your nurse or  doctor.        alendronate 70 MG tablet Commonly known as: FOSAMAX Take 1 tablet (70 mg total) by mouth every 7 (seven) days. Take with a full glass of water on an empty stomach.   allopurinol 100 MG tablet Commonly known as: ZYLOPRIM TAKE ONE TABLET BY MOUTH DAILY   atorvastatin 40 MG tablet Commonly known as: LIPITOR TAKE 1 AND 1/2 TABLET BY MOUTH AT BEDTIME   carvedilol 6.25 MG tablet Commonly known as: COREG TAKE ONE TABLET BY MOUTH TWICE A DAY WITH MEALS   clobetasol cream 0.05 % Commonly known as: TEMOVATE   cyclobenzaprine 10 MG tablet Commonly known as: FLEXERIL Take 1 tablet (10 mg total) by mouth 2 (two) times daily as needed for muscle spasms.   dapagliflozin propanediol 10 MG Tabs tablet Commonly known as: Farxiga Take 1 tablet (10 mg total) by mouth daily before breakfast.   DULoxetine 60 MG capsule Commonly known as: CYMBALTA TAKE ONE CAPSULE BY MOUTH DAILY   Entresto 97-103 MG Generic drug: sacubitril-valsartan Take 1 tablet by mouth 2 (two) times daily.   ketoconazole 2 % cream Commonly known as: NIZORAL Apply 1 application topically daily.   levothyroxine 125 MCG tablet Commonly known as: SYNTHROID Take 1 tablet (125 mcg total) by mouth daily before breakfast.   metFORMIN 500 MG tablet Commonly known as: GLUCOPHAGE Take 1 tablet (500 mg total) by mouth 2 (two) times daily with a meal.   omeprazole 20 MG capsule Commonly known as: PRILOSEC Take 1 capsule (20 mg total) by mouth 2 (two) times daily before a meal.   rivaroxaban 20 MG Tabs tablet Commonly known as: Xarelto Take 1 tablet (20 mg total) by mouth daily with supper.   spironolactone 25 MG tablet Commonly known as: ALDACTONE Take 1 tablet (25 mg total) by mouth daily.   torsemide 20 MG tablet Commonly known as: DEMADEX Take 1 tablet (20 mg total) by mouth daily.   Vitamin D3 125 MCG (5000 UT) Caps Take 5,000 Units by mouth daily.   zolpidem 5 MG tablet Commonly known as:  AMBIEN TAKE ONE TABLET BY MOUTH AT BEDTIME AS NEEDED          Objective:   Physical Exam BP 136/84 (BP Location: Left Arm, Patient Position: Sitting, Cuff Size: Normal)   Pulse 93   Temp 98 F (36.7 C) (Oral)   Resp 18   Ht 5\' 5"  (1.651 m)   Wt 219 lb 2 oz (99.4 kg)   SpO2 98%   BMI 36.46 kg/m  General:   Well developed, NAD, BMI noted. HEENT:  Normocephalic . Face symmetric, atraumatic Lungs:  CTA B Normal respiratory effort, no intercostal retractions, no accessory muscle use. Heart: Seems regular today .  Lower extremities: no pretibial edema bilaterally  Skin: Not pale. Not  jaundice Neurologic:  alert & oriented X3.  Speech normal, gait appropriate for age and unassisted Psych--  Cognition and judgment appear intact.  Cooperative with normal attention span and concentration.  Behavior appropriate. No anxious or depressed appearing.      Assessment     Assessment   Hyperglycemia HTN High cholesterol Hypothyroidism Anxiety depression, insomnia CV: --CAD: stent ~ 2009 --Atrial fibrillation DX 06-2017  --06-2019 A. fib, RVR, failed cardioversion,, cath: Normal coronaries, had AV nodal ablation, ICD implanted  --Non ischemic cardiomyopathy, DX 06-2019, tachycardia related? Obesity  Vitamin D deficiency, saw Dr. Cruzita Lederer 07-2016, rtc 1 year, celiac dz test (-), unlikely  Malabsortion; cont supplements  GERD H/o achalasia, s/p [place] 2009 SINGLE KIDNEY---S/p R  Nephrectomy ([place] 1991 due to stones/fibrosis) MSK: --Fibromyalgia --DJD  Knee pain R --Back pain -- s/p epidural 10-16 Osteoporosis: Used to be managed by gynecology, now PCP. 08-2014 T score -2.8, 09-2015: T score -2.7.- Was Rx Evista 09-2014  h/o cervical dysplasia Derm: vulvar pruritus, temovate prn ok per gyn  PLAN: Hyperglycemia: Last A1c elevated compared to previous, doing well with diet, check A1c HTN: Well-controlled, continue Carvedilol, Entresto, Aldactone, Demadex.  Last BMP  satisfactory.  Hypothyroidism: On Synthroid, last TSH very good Cardiovascular: Recently saw cardiology, digoxin stopped, started [place].  Seems to be doing well. Anemia: Per chart review: Check CBC, iron, ferritin, TIBC. Preventive care: Reviewed RTC CPX 4 months This visit occurred during the SARS-CoV-2 public health emergency.  Safety protocols were in place, including screening questions prior to the visit, additional usage of staff PPE, and extensive cleaning of exam room while observing appropriate contact time as indicated for disinfecting solutions.

## 2020-06-19 LAB — CBC WITH DIFFERENTIAL/PLATELET
Absolute Monocytes: 462 cells/uL (ref 200–950)
Basophils Absolute: 77 cells/uL (ref 0–200)
Basophils Relative: 1 %
Eosinophils Absolute: 154 cells/uL (ref 15–500)
Eosinophils Relative: 2 %
HCT: 32 % — ABNORMAL LOW (ref 35.0–45.0)
Hemoglobin: 10 g/dL — ABNORMAL LOW (ref 11.7–15.5)
Lymphs Abs: 1717 cells/uL (ref 850–3900)
MCH: 27 pg (ref 27.0–33.0)
MCHC: 31.3 g/dL — ABNORMAL LOW (ref 32.0–36.0)
MCV: 86.3 fL (ref 80.0–100.0)
MPV: 11 fL (ref 7.5–12.5)
Monocytes Relative: 6 %
Neutro Abs: 5290 cells/uL (ref 1500–7800)
Neutrophils Relative %: 68.7 %
Platelets: 324 10*3/uL (ref 140–400)
RBC: 3.71 10*6/uL — ABNORMAL LOW (ref 3.80–5.10)
RDW: 14.6 % (ref 11.0–15.0)
Total Lymphocyte: 22.3 %
WBC: 7.7 10*3/uL (ref 3.8–10.8)

## 2020-06-19 LAB — IRON, TOTAL/TOTAL IRON BINDING CAP
%SAT: 9 % (calc) — ABNORMAL LOW (ref 16–45)
Iron: 46 ug/dL (ref 45–160)
TIBC: 485 mcg/dL (calc) — ABNORMAL HIGH (ref 250–450)

## 2020-06-19 LAB — FERRITIN: Ferritin: 8 ng/mL — ABNORMAL LOW (ref 16–288)

## 2020-06-19 LAB — HEMOGLOBIN A1C
Hgb A1c MFr Bld: 6.7 % of total Hgb — ABNORMAL HIGH (ref <5.7)
Mean Plasma Glucose: 146 (calc)
eAG (mmol/L): 8.1 (calc)

## 2020-06-20 NOTE — Assessment & Plan Note (Signed)
Hyperglycemia: Last A1c elevated compared to previous, doing well with diet, check A1c HTN: Well-controlled, continue Carvedilol, Entresto, Aldactone, Demadex.  Last BMP satisfactory.  Hypothyroidism: On Synthroid, last TSH very good Cardiovascular: Recently saw cardiology, digoxin stopped, started [place].  Seems to be doing well. Anemia: Per chart review: Check CBC, iron, ferritin, TIBC. Preventive care: Reviewed RTC CPX 4 months

## 2020-06-20 NOTE — Assessment & Plan Note (Signed)
Preventive care reviewed. Had COVID vaccine including a booster Had a flu shot Had a colonoscopy 2019, next per GI. Had a mammogram 09/2019

## 2020-06-22 ENCOUNTER — Ambulatory Visit (INDEPENDENT_AMBULATORY_CARE_PROVIDER_SITE_OTHER): Payer: Medicare Other

## 2020-06-22 DIAGNOSIS — I442 Atrioventricular block, complete: Secondary | ICD-10-CM | POA: Diagnosis not present

## 2020-06-22 LAB — CUP PACEART REMOTE DEVICE CHECK
Battery Remaining Longevity: 111 mo
Battery Voltage: 3 V
Brady Statistic RA Percent Paced: 0 %
Brady Statistic RV Percent Paced: 99.64 %
Date Time Interrogation Session: 20211107233155
Implantable Lead Implant Date: 20201109
Implantable Lead Implant Date: 20201109
Implantable Lead Implant Date: 20201109
Implantable Lead Location: 753858
Implantable Lead Location: 753859
Implantable Lead Location: 753860
Implantable Lead Model: 4598
Implantable Lead Model: 5076
Implantable Lead Model: 5076
Implantable Pulse Generator Implant Date: 20201109
Lead Channel Impedance Value: 1026 Ohm
Lead Channel Impedance Value: 1178 Ohm
Lead Channel Impedance Value: 1235 Ohm
Lead Channel Impedance Value: 361 Ohm
Lead Channel Impedance Value: 456 Ohm
Lead Channel Impedance Value: 494 Ohm
Lead Channel Impedance Value: 532 Ohm
Lead Channel Impedance Value: 532 Ohm
Lead Channel Impedance Value: 627 Ohm
Lead Channel Impedance Value: 722 Ohm
Lead Channel Impedance Value: 722 Ohm
Lead Channel Impedance Value: 779 Ohm
Lead Channel Impedance Value: 931 Ohm
Lead Channel Impedance Value: 988 Ohm
Lead Channel Pacing Threshold Amplitude: 0.5 V
Lead Channel Pacing Threshold Amplitude: 1.125 V
Lead Channel Pacing Threshold Pulse Width: 0.4 ms
Lead Channel Pacing Threshold Pulse Width: 0.8 ms
Lead Channel Sensing Intrinsic Amplitude: 1.5 mV
Lead Channel Sensing Intrinsic Amplitude: 1.5 mV
Lead Channel Sensing Intrinsic Amplitude: 6.25 mV
Lead Channel Sensing Intrinsic Amplitude: 7.125 mV
Lead Channel Setting Pacing Amplitude: 1.75 V
Lead Channel Setting Pacing Amplitude: 2.5 V
Lead Channel Setting Pacing Pulse Width: 0.4 ms
Lead Channel Setting Pacing Pulse Width: 0.8 ms
Lead Channel Setting Sensing Sensitivity: 2 mV

## 2020-06-22 NOTE — Telephone Encounter (Signed)
Generally seems to be retaining more fluid.  Would increase torsemide to 40 mg daily, BMET in 1 week, get appt in CHF APP clinic.

## 2020-06-22 NOTE — Telephone Encounter (Signed)
Spoke with pt/ her spouse also on phone assisting with translation.  Pt denies any physical symptoms of increased fluid retention or any cardiac symptoms.  Pt confirms compliance with Torsemide 20mg  daily.    Advised I would forward to Dr. Aundra Dubin in AHF clinic.  If pt has any change in condition to notify MD or seek emergent care depending on severity of symptoms.

## 2020-06-23 ENCOUNTER — Telehealth: Payer: Self-pay | Admitting: Internal Medicine

## 2020-06-23 DIAGNOSIS — D509 Iron deficiency anemia, unspecified: Secondary | ICD-10-CM

## 2020-06-23 MED ORDER — TORSEMIDE 20 MG PO TABS
40.0000 mg | ORAL_TABLET | Freq: Every day | ORAL | 3 refills | Status: DC
Start: 2020-06-23 — End: 2020-09-23

## 2020-06-23 NOTE — Telephone Encounter (Signed)
Spoke with husband per DPR.  Advised of Dr Claris Gladden recommendations to increase Torsemide to 2 tablets (40 mg total) once a day.  Agreed to lab appointment and scheduled for 07/01/2020 at 10:00AM.  Message sent to HF clinic scheduler to call patient/husband to schedule an appointment with APP.  Husband verbalized understanding.  He thinks he has enough Torsemide on hand for dosage change.  Also ICM intro given and he agreed to monthly ICM follow up for patient.  ICM remote transmission scheduled for 06/30/2020 to recheck fluid levels.

## 2020-06-23 NOTE — Telephone Encounter (Signed)
Lab results: Diabetes is okay, continue with hemoglobin profoundly low ferritin, GI history reviewed: History of GERD, achalasia, Barrett's esophagus, EGD & colonoscopy Dr. Harl Favor 06/04/2018: Polyps, erosive gastritis, esophagitis BX show acute and chronic inflammation/esophagitis. Colon polyps c/w TA. Was recommended: Repeat EGD in 3 months to f/u esophagitis.  Repeat colon in 3 years for polyp surveillance.  Plan: 1.  Send a prescription for iron 325 1 p.o. twice daily #60 and 6 refills take it away from Prilosec. 2.  GI referral, Dr. Harl Favor, DX iron deficiency, due for repeat EGD 3.  Left detailed message for the patient

## 2020-06-23 NOTE — Progress Notes (Signed)
Remote pacemaker transmission.   

## 2020-06-23 NOTE — Telephone Encounter (Addendum)
Attempted call to patient/husband to provide Dr Claris Gladden recommendations.  Left message to return call.   Patient referred to [facility] clinic by Darrick Grinder, NP.

## 2020-06-24 MED ORDER — FERROUS FUMARATE 325 (106 FE) MG PO TABS: 1.0000 | ORAL_TABLET | Freq: Two times a day (BID) | ORAL | 6 refills | Status: AC

## 2020-06-24 NOTE — Telephone Encounter (Signed)
Ferrous Fumerate 325mg  bid sent to [facility]. GI referral placed.

## 2020-06-25 ENCOUNTER — Telehealth (HOSPITAL_COMMUNITY): Payer: Self-pay | Admitting: Vascular Surgery

## 2020-06-25 NOTE — Telephone Encounter (Signed)
appt sch for 11/15

## 2020-06-25 NOTE — Telephone Encounter (Signed)
Left pt message giving ASAP F/U , 11/15 @ 8:30 am, asked pt to call back to confirm

## 2020-06-28 DIAGNOSIS — R531 Weakness: Secondary | ICD-10-CM | POA: Diagnosis not present

## 2020-06-28 DIAGNOSIS — K529 Noninfective gastroenteritis and colitis, unspecified: Secondary | ICD-10-CM | POA: Diagnosis not present

## 2020-06-29 ENCOUNTER — Encounter (HOSPITAL_COMMUNITY): Payer: Medicare Other

## 2020-06-30 ENCOUNTER — Ambulatory Visit (INDEPENDENT_AMBULATORY_CARE_PROVIDER_SITE_OTHER): Payer: Medicare Other

## 2020-06-30 ENCOUNTER — Other Ambulatory Visit: Payer: Self-pay

## 2020-06-30 ENCOUNTER — Ambulatory Visit (HOSPITAL_COMMUNITY)
Admission: RE | Admit: 2020-06-30 | Discharge: 2020-06-30 | Disposition: A | Discharge status: Home / Self Care | Payer: Medicare Other | Source: Ambulatory Visit | Attending: Adult Health | Admitting: Adult Health

## 2020-06-30 VITALS — BP 114/82 | HR 92 | Wt 209.6 lb

## 2020-06-30 DIAGNOSIS — Z7984 Long term (current) use of oral hypoglycemic drugs: Secondary | ICD-10-CM | POA: Diagnosis not present

## 2020-06-30 DIAGNOSIS — I5022 Chronic systolic (congestive) heart failure: Secondary | ICD-10-CM | POA: Diagnosis not present

## 2020-06-30 DIAGNOSIS — Z955 Presence of coronary angioplasty implant and graft: Secondary | ICD-10-CM | POA: Insufficient documentation

## 2020-06-30 DIAGNOSIS — Z7901 Long term (current) use of anticoagulants: Secondary | ICD-10-CM | POA: Insufficient documentation

## 2020-06-30 DIAGNOSIS — Z79890 Hormone replacement therapy (postmenopausal): Secondary | ICD-10-CM | POA: Insufficient documentation

## 2020-06-30 DIAGNOSIS — N183 Chronic kidney disease, stage 3 unspecified: Secondary | ICD-10-CM | POA: Diagnosis not present

## 2020-06-30 DIAGNOSIS — R5383 Other fatigue: Secondary | ICD-10-CM | POA: Insufficient documentation

## 2020-06-30 DIAGNOSIS — I340 Nonrheumatic mitral (valve) insufficiency: Secondary | ICD-10-CM | POA: Diagnosis not present

## 2020-06-30 DIAGNOSIS — I4821 Permanent atrial fibrillation: Secondary | ICD-10-CM

## 2020-06-30 DIAGNOSIS — I5042 Chronic combined systolic (congestive) and diastolic (congestive) heart failure: Secondary | ICD-10-CM | POA: Diagnosis not present

## 2020-06-30 DIAGNOSIS — N1831 Chronic kidney disease, stage 3a: Secondary | ICD-10-CM

## 2020-06-30 DIAGNOSIS — E039 Hypothyroidism, unspecified: Secondary | ICD-10-CM | POA: Insufficient documentation

## 2020-06-30 DIAGNOSIS — Z79899 Other long term (current) drug therapy: Secondary | ICD-10-CM | POA: Diagnosis not present

## 2020-06-30 DIAGNOSIS — Z950 Presence of cardiac pacemaker: Secondary | ICD-10-CM

## 2020-06-30 DIAGNOSIS — Z87891 Personal history of nicotine dependence: Secondary | ICD-10-CM | POA: Insufficient documentation

## 2020-06-30 DIAGNOSIS — I428 Other cardiomyopathies: Secondary | ICD-10-CM | POA: Insufficient documentation

## 2020-06-30 DIAGNOSIS — I2510 Atherosclerotic heart disease of native coronary artery without angina pectoris: Secondary | ICD-10-CM | POA: Diagnosis not present

## 2020-06-30 DIAGNOSIS — Z7983 Long term (current) use of bisphosphonates: Secondary | ICD-10-CM | POA: Insufficient documentation

## 2020-06-30 DIAGNOSIS — I4819 Other persistent atrial fibrillation: Secondary | ICD-10-CM | POA: Insufficient documentation

## 2020-06-30 DIAGNOSIS — I10 Essential (primary) hypertension: Secondary | ICD-10-CM | POA: Diagnosis not present

## 2020-06-30 DIAGNOSIS — R0683 Snoring: Secondary | ICD-10-CM

## 2020-06-30 DIAGNOSIS — Z8616 Personal history of COVID-19: Secondary | ICD-10-CM | POA: Insufficient documentation

## 2020-06-30 LAB — BASIC METABOLIC PANEL
Anion gap: 12 (ref 5–15)
BUN: 32 mg/dL — ABNORMAL HIGH (ref 8–23)
CO2: 26 mmol/L (ref 22–32)
Calcium: 9.7 mg/dL (ref 8.9–10.3)
Chloride: 101 mmol/L (ref 98–111)
Creatinine, Ser: 1.44 mg/dL — ABNORMAL HIGH (ref 0.44–1.00)
GFR, Estimated: 40 mL/min — ABNORMAL LOW (ref >60)
Glucose, Bld: 189 mg/dL — ABNORMAL HIGH (ref 70–99)
Potassium: 3.8 mmol/L (ref 3.5–5.1)
Sodium: 139 mmol/L (ref 135–145)

## 2020-06-30 NOTE — Progress Notes (Signed)
PCP: Colon Branch, MD  HF Cardiology: Dr. Aundra Dubin  64 y.o. with CAD s/p PCI in 2010, chronic combined systolic and diastolic HF (EF 23-55% in 2018), atrial fibrillation on chronic anticoagulation w/ Xarelto, HTN, and hypothyroidism was diagnosed w/ COVID-19 in Sep 2020 and treated at [facility] infection c/b PNA. She was discharged and readmitted back to [facility] 10/9-10/13/20 for gastroenteritis, felt to be a sequela of COVID-19. She was managed w/ supportive care and discharged home. Had f/u with PCP 10/22 and endorsed worsening LLQ pain leading to abdominal CT which showed acute diverticulitis. She was started on outpatient antibiotics, cipro + flagyl but symptoms failed to improve, prompting her to seek emergency medical care at [facility]  Initial EKG in the ED showed afib w/ RVR for which general cardiology was consulted. She was started on IV dilt for rate control but later required treatment w/ IV amiodarone due to persistent rapid ventricular response. Hospital course c/b volume overload, requiring IV diuretics. 2D echo was obtained and showed worsening LVEF, now 25-30% w/ moderate MR. She underwent subsequent R/LHC in 11/20 which demonstrated widely patent coronaries. RHC showed elevated mean RA pressure at 22, PWP 43, LVEDP 45 and low CO. CI by Fick 1.6. She underwent TEE-guided DCCV, TEE showed EF 15% with moderate RV dysfunction and moderate MR.  She went back into rapid atrial fibrillation.  She failed additional cardioversions.  Despite amiodarone gtt, HR remained in the 140s-150s.  Finally, she underwent AV nodal ablation with BiV pacing due to inability to control atrial fibrillation. While in the hospital, she was on milrinone gtt for a period of time, but we were able to taper it off. She was discharged home after medication optimization.   Echo in 2/21 showed EF up to 40-45%, mild diffuse hypokinesis, RV low normal function.   Today she returns for HF follow up. Last visit  farxiga was started. Since that appointment, fluid index was still suggestive of fluid overload so last week torsemide was increased to 40 mg daily. Overall feeling fine. Denies SOB/PND/Orthopnea. Appetite ok. No fever or chills. Weight at home has been 207-209 pounds. Taking all medications.   Optivol: Fluid index well below threshold, persistent A fib, 100% BIVi pacing.   Labs (11/20): digoxin level 1.4, creatinine 1.81, K 4.8, hgb 13.9 Labs (1/21): LDL 91, K 3.7, creatinine 1.32 Labs (2/21): K 5.1, creatinine 1.37, TSH normal, digoxin 0.4 Labs (5/21): LDL 68, digoxin 0.3 Labs (6/21): K 4.4, creatinine 1.21  Medtronic device interrogation: Fluid index well below threshold. No VT.  PMH: 1. Hypothyroidism 2. Single kidney s/p nephrectomy 3. Atrial fibrillation: Initially diagnosed in 2018.  She was on sotalol at one point to maintain NSR.  - Uncontrollable atrial fibrillation during 11/20 admission, patient eventually had AV nodal ablation with CRT-P implantation.  4. H/o achalasia 5. GERD 6. Fibromyalgia 7. H/o diverticulitis in 10/20 8. COVID-19 in 9/20.  9. H/o cervical cancer: cryotherapy.  10. Degenerative disc disease.  11. Chronic systolic CHF: Nonischemic cardiomyopathy, possibly tachycardia-mediated in setting of uncontrolled atrial fibrillation. Medtronic CRT-P device.  - Echo (2018): EF 45-50%.  - Echo (11/20): EF 25-30%, mildly decreased RV systolic function, moderate MR.  - TEE (11/20): EF 15%, moderately decreased RV systolic function, moderate MR.  - Cardiac MRI (11/20): EF 34%, RV EF 30%, no LGE (done after AV nodal ablation and BiV pacing).  - RHC (11/20): mean RA 22, mean PCWP 43, LVEDP 45, CI 1.6 (Fick) and 2.7 (thermodilution).  -  Echo (2/21): EF 40-45%, mild diffuse hypokinesis, low normal RV systolic function.  12. CAD: PCI in 2010.  - LHC (11/20): No significant coronary disease.   Social History   Socioeconomic History  . Marital status: Married    Spouse  name: Not on file  . Number of children: 3  . Years of education: Not on file  . Highest education level: Not on file  Occupational History  . 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 Smoking status: Former Smoker    Packs/day: 2.00    Years: 28.00    Pack years: 56.00    Types: Cigarettes    Quit date: 2000    Years since quitting: 21.8  . Smokeless tobacco: Never Used  Vaping Use  . Vaping Use: Never used  Substance and Sexual Activity  . Alcohol use: Yes    Alcohol/week: 5.0 standard drinks    Types: 5 Cans of beer per week    Comment: 07/27/2017 "3-4 drinks//month"  . Drug use: No  . Sexual activity: Not Currently    Comment: 1ST INTERCOURSE- 16, PARTNERS - 2  Other Topics Concern  . Not on file  Social History Narrative   Original from [place]   3 children, 2 alive   Household --pt and husband    Social Determinants of Radio broadcast assistant Strain:   . Difficulty of Paying Living Expenses: Not on file  Food Insecurity:   . Worried About Charity fundraiser in the Last Year: Not on file  . Ran Out of Food in the Last Year: Not on file  Transportation Needs:   . Lack of Transportation (Medical): Not on file  . Lack of Transportation (Non-Medical): Not on file  Physical Activity:   . Days of Exercise per Week: Not on file  . Minutes of Exercise per Session: Not on file  Stress:   . Feeling of Stress : Not on file  Social Connections:   . Frequency of Communication with Friends and Family: Not on file  . Frequency of Social Gatherings with Friends and Family: Not on file  . Attends Religious Services: Not on file  . Active Member of Clubs or Organizations: Not on file  . Attends Archivist Meetings: Not on file  . Marital Status: Not on file  Intimate Partner Violence:   . Fear of Current or Ex-Partner: Not on file  . Emotionally Abused: Not on file  . Physically Abused: Not on file  . Sexually Abused: Not on file   Family History    Problem Relation Age of Onset  . Breast cancer Other        aunt   . CAD Brother   . Lung cancer Mother        F and M  . Diabetes Father        F and mother   . CAD Father   . Lung cancer Father   . Stroke Sister   . Colon cancer Neg Hx    ROS: All systems reviewed and negative except as per HPI.   Current Outpatient Medications  Medication Sig Dispense Refill  . alendronate (FOSAMAX) 70 MG tablet Take 1 tablet (70 mg total) by mouth every 7 (seven) days. Take with a full glass of water on an empty stomach. 12 tablet 3  . allopurinol (ZYLOPRIM) 100 MG tablet TAKE ONE TABLET BY MOUTH DAILY 90 tablet 0  . atorvastatin (LIPITOR) 40 MG tablet TAKE  1 AND 1/2 TABLET BY MOUTH AT BEDTIME 135 tablet 0  . carvedilol (COREG) 6.25 MG tablet TAKE ONE TABLET BY MOUTH TWICE A DAY WITH MEALS 180 tablet 3  . Cholecalciferol (VITAMIN D3) 5000 UNITS CAPS Take 5,000 Units by mouth daily.     . clobetasol cream (TEMOVATE) 0.05 %     . cyclobenzaprine (FLEXERIL) 10 MG tablet Take 1 tablet (10 mg total) by mouth 2 (two) times daily as needed for muscle spasms. 60 tablet 1  . dapagliflozin propanediol (FARXIGA) 10 MG TABS tablet Take 1 tablet (10 mg total) by mouth daily before breakfast. 30 tablet 3  . DULoxetine (CYMBALTA) 60 MG capsule TAKE ONE CAPSULE BY MOUTH DAILY 90 capsule 0  . ferrous fumarate (HEMOCYTE - 106 MG FE) 325 (106 Fe) MG TABS tablet Take 1 tablet (106 mg of iron total) by mouth 2 (two) times daily. Do not take at the same time as omeprazole (Prilosec) 60 tablet 6  . ketoconazole (NIZORAL) 2 % cream Apply 1 application topically daily. 60 g 0  . levothyroxine (SYNTHROID) 125 MCG tablet Take 1 tablet (125 mcg total) by mouth daily before breakfast. 90 tablet 1  . metFORMIN (GLUCOPHAGE) 500 MG tablet Take 1 tablet (500 mg total) by mouth 2 (two) times daily with a meal. 180 tablet 1  . omeprazole (PRILOSEC) 20 MG capsule Take 1 capsule (20 mg total) by mouth 2 (two) times daily before a  meal. 180 capsule 3  . rivaroxaban (XARELTO) 20 MG TABS tablet Take 1 tablet (20 mg total) by mouth daily with supper. 90 tablet 3  . sacubitril-valsartan (ENTRESTO) 97-103 MG Take 1 tablet by mouth 2 (two) times daily. 60 tablet 3  . spironolactone (ALDACTONE) 25 MG tablet Take 1 tablet (25 mg total) by mouth daily. 30 tablet 11  . torsemide (DEMADEX) 20 MG tablet Take 2 tablets (40 mg total) by mouth daily. 180 tablet 3  . zolpidem (AMBIEN) 5 MG tablet TAKE ONE TABLET BY MOUTH AT BEDTIME AS NEEDED 90 tablet 0   No current facility-administered medications for this encounter.   BP 114/82   Pulse 92   Wt 95.1 kg (209 lb 9.6 oz)   SpO2 100%   BMI 34.88 kg/m   Wt Readings from Last 3 Encounters:  06/30/20 95.1 kg (209 lb 9.6 oz)  06/18/20 99.4 kg (219 lb 2 oz)  06/04/20 103.1 kg (227 lb 3.2 oz)   General:  Well appearing. No resp difficulty. Walked in the clinic  HEENT: normal Neck: supple. no JVD. Carotids 2+ bilat; no bruits. No lymphadenopathy or thryomegaly appreciated. Cor: PMI nondisplaced. Regular rate & rhythm. No rubs, gallops or murmurs. Lungs: clear Abdomen: soft, nontender, nondistended. No hepatosplenomegaly. No bruits or masses. Good bowel sounds. Extremities: no cyanosis, clubbing, rash, edema Neuro: alert & orientedx3, cranial nerves grossly intact. moves all 4 extremities w/o difficulty. Affect pleasant  Assessment/Plan: 1. Chronic systolic CHF: Nonischemic cardiomyopathy. Echo in 2018 with EF 45-50%. Echo in 11/20 with EF 25-30%, diffuse hypokinesis, mildly decreased RV systolic function, moderate MR. LHC/RHC in 11/20 with no significant coronary disease, markedly elevated filling pressures and low cardiac output (CI 1.6). 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 Cardiac MRI 11/20 showed LVEF 34%, RVEF 30%, no LGE => perhaps pointing to tachy-mediated  CMP as more likely etiology.  Initially required inotropic support w/ milrinone but able to titrate off.  Now s/p AV nodal ablation with MDT CRT-P.  Echo in 2/21 showed EF up to 40-45%. NYHA II. Volume status stable. Optivol reviewed. Fluid index low.  - Continue torsemide 40 mg daily.  - Continue farxiga 10 mg daily.  -Continue Entresto to 97/103 bid. - Continue spironolactone to 25 daily.  -  Continue Coreg 6.25 mg bid.  - Check BMET now.  2. Atrial fibrillation: Persistent atrial fibrillation, it appears, at least since 9/20 when she was admitted with COVID-19. Prior, she had paroxysmal atrial fibrillation and was maintained on sotalol. This was later stopped. .  Unable to cardiovert or rate control atrial fibrillation. Therefore, she had AV nodal ablation with BiV pacing.  - Now off amiodarone.  Rate controlled. No bleeding issues.  - Continue Xarelto 20 mg daily. - Recent CBC stable  3. Mitral regurgitation: Functional MR, moderate on TEE.  Rate was controlled on cardiac MRI, MR was less impressive.  Minimal MR on echo in 2/21.  4. CKD stage 3: Check BMET  5. Suspect OSA: Complaining of day time fatigue and snoring. Arrange sleep study.     Check bmet. Discussed Optivol results.  Follow up 3 months with Dr Aundra Dubin.    Tagen Milby NP-C  06/30/2020

## 2020-06-30 NOTE — Progress Notes (Signed)
EPIC Encounter for ICM Monitoring  Patient Name: Melinda Mckinney is a 64 y.o. female Date: 06/30/2020 Primary Care Physican: Colon Branch, MD Primary Cardiologist: Aundra Dubin Electrophysiologist: Santina Evans Pacing: 99.5%  06/30/2020 Weight: 207-209 lbs    Time in AT/AF 24.0 hr/day (100.0%)     1st remote transmission. Transmission reviewed.  Patient seen in Advanced HF clinic today and recommendations given at that time.    Optivol thoracic impedance fluid levels returned to normal after Furosemide dosage was increased to 40 mg daily  Prescribed: Furosemide 40 mg take 1 tablet daily.   Labs: 06/30/2020 Creatinine 1.44, BUN 32, Potassium 3.8, Sodium 139, GFR 40 06/04/2020 Creatinine 0.98, BUN 13, Potassium 4.0, Sodium 140, GFR >60  04/21/2020 Creatinine 1.19, BUN 14, Potassium 4.2, Sodium 140  04/10/2020 Creatinine 1.13, BUN 15, Potassium 3.9, Sodium 139  01/14/2020 Creatinine 1.21, BUN 17, Potassium 4.4, Sodium 142  A complete set of results can be found in Results Review.  Recommendations:  Recommendations given at office visit today, 06/30/2020.  Follow-up plan: ICM clinic phone appointment on 07/27/2020.   91 day device clinic remote transmission 09/21/2020.    EP/Cardiology Office Visits: 10/01/2020 with Dr. Aundra Dubin.    Copy of ICM check sent to Dr. Lovena Le.   3 month ICM trend: 06/30/2020    1 Year ICM trend:       Rosalene Billings, RN 06/30/2020 2:14 PM

## 2020-06-30 NOTE — Patient Instructions (Addendum)
  It was great to see you today! No medication changes are needed at this time.  Labs today We will only contact you if something comes back abnormal or we need to make some changes. Otherwise no news is good news!  Your provider has recommended that you have a home sleep study.  This has to be approved by your insurance company. We will schedule you an appointment to pick up the equipment to give [place] time to complete this authorization. Once you have the equipment you will download the app on your phone and follow the instructions. YOUR PIN NUMBER IS: [id number]. Once you have completed the test the information is sent to the company through [employer] and you can dispose of the equipment. If your test is positive you will receive a call from Dr Theodosia Blender office [facility]) to set up your CPAP equipment.  Your physician recommends that you schedule a follow-up appointment in: 3 months with Dr Aundra Dubin  If you have any questions or concerns before your next appointment please send [place] a message through [facility] or call our office at [phone].    TO LEAVE A MESSAGE FOR THE NURSE SELECT OPTION 2, PLEASE LEAVE A MESSAGE INCLUDING: . YOUR NAME . DATE OF BIRTH . CALL BACK NUMBER . REASON FOR CALL**this is important as we prioritize the call backs  YOU WILL RECEIVE A CALL BACK THE SAME DAY AS LONG AS YOU CALL BEFORE 4:00 PM

## 2020-06-30 NOTE — Progress Notes (Signed)
Height: 5'5"    Weight:209 BMI:34.8  Today's Date:06/30/2020  STOP BANG RISK ASSESSMENT S (snore) Have you been told that you snore?     YES   T (tired) Are you often tired, fatigued, or sleepy during the day?   YES  O (obstruction) Do you stop breathing, choke, or gasp during sleep? YES   P (pressure) Do you have or are you being treated for high blood pressure? YES   B (BMI) Is your body index greater than 35 kg/m? NO   A (age) Are you 66 years old or older? YES   N (neck) Do you have a neck circumference greater than 16 inches?   NO   G (gender) Are you a female? NO   TOTAL STOP/BANG "YES" ANSWERS                                                                        For Office Use Only              Procedure Order Form    YES to 3+ Stop Bang questions OR two clinical symptoms - patient qualifies for WatchPAT (CPT 95800)      Clinical Notes: Will consult Sleep Specialist and refer for management of therapy due to patient increased risk of Sleep Apnea. Ordering a sleep study due to the following two clinical symptoms: Excessive daytime sleepiness G47.10 / G History of high blood pressure R03.0 / Insomnia G47.00

## 2020-07-01 ENCOUNTER — Other Ambulatory Visit (HOSPITAL_COMMUNITY): Payer: Medicare Other

## 2020-07-02 ENCOUNTER — Other Ambulatory Visit: Payer: Self-pay | Admitting: Internal Medicine

## 2020-07-10 ENCOUNTER — Other Ambulatory Visit: Payer: Self-pay | Admitting: Internal Medicine

## 2020-07-17 DIAGNOSIS — Z86010 Personal history of colonic polyps: Secondary | ICD-10-CM | POA: Diagnosis not present

## 2020-07-17 DIAGNOSIS — D509 Iron deficiency anemia, unspecified: Secondary | ICD-10-CM | POA: Diagnosis not present

## 2020-07-17 DIAGNOSIS — Z7901 Long term (current) use of anticoagulants: Secondary | ICD-10-CM | POA: Diagnosis not present

## 2020-07-17 DIAGNOSIS — K219 Gastro-esophageal reflux disease without esophagitis: Secondary | ICD-10-CM | POA: Diagnosis not present

## 2020-07-17 DIAGNOSIS — R1314 Dysphagia, pharyngoesophageal phase: Secondary | ICD-10-CM | POA: Diagnosis not present

## 2020-07-19 ENCOUNTER — Other Ambulatory Visit: Payer: Self-pay | Admitting: Internal Medicine

## 2020-07-19 ENCOUNTER — Other Ambulatory Visit (HOSPITAL_COMMUNITY): Payer: Self-pay | Admitting: Cardiology

## 2020-07-24 ENCOUNTER — Telehealth (HOSPITAL_COMMUNITY): Payer: Self-pay

## 2020-07-24 NOTE — Telephone Encounter (Signed)
Received a fax requesting surgical clearance from [facility]. Clearance was successfully faxed to: [phone] ,which was the number provided.. Clearance form will be scanned into patients chart.

## 2020-07-27 ENCOUNTER — Ambulatory Visit (INDEPENDENT_AMBULATORY_CARE_PROVIDER_SITE_OTHER): Payer: Medicare Other

## 2020-07-27 DIAGNOSIS — I5022 Chronic systolic (congestive) heart failure: Secondary | ICD-10-CM

## 2020-07-27 DIAGNOSIS — Z950 Presence of cardiac pacemaker: Secondary | ICD-10-CM | POA: Diagnosis not present

## 2020-07-28 ENCOUNTER — Telehealth: Payer: Self-pay

## 2020-07-28 DIAGNOSIS — R531 Weakness: Secondary | ICD-10-CM | POA: Diagnosis not present

## 2020-07-28 DIAGNOSIS — K529 Noninfective gastroenteritis and colitis, unspecified: Secondary | ICD-10-CM | POA: Diagnosis not present

## 2020-07-28 NOTE — Telephone Encounter (Signed)
Remote ICM transmission received.  Attempted call to patient regarding ICM remote transmission and left message to return call   

## 2020-07-28 NOTE — Progress Notes (Signed)
EPIC Encounter for ICM Monitoring  Patient Name: Melinda Mckinney is a 64 y.o. female Date: 07/28/2020 Primary Care Physican: Colon Branch, MD Primary Cardiologist: Aundra Dubin Electrophysiologist: Santina Evans Pacing: 99.5%         06/30/2020 Weight: 207-209 lbs           Time in AT/AF  24.0 hr/day (100.0%)                                 Attempted call to husband/patient and unable to reach.  Left message to return call. Transmission reviewed.    Optivol thoracic impedance trending just below baseline at time of transmission  Prescribed: Torsemide 40 mg take 1 tablet daily.   Labs: 06/30/2020 Creatinine 1.44, BUN 32, Potassium 3.8, Sodium 139, GFR 40 06/04/2020 Creatinine 0.98, BUN 13, Potassium 4.0, Sodium 140, GFR >60  04/21/2020 Creatinine 1.19, BUN 14, Potassium 4.2, Sodium 140  04/10/2020 Creatinine 1.13, BUN 15, Potassium 3.9, Sodium 139  01/14/2020 Creatinine 1.21, BUN 17, Potassium 4.4, Sodium 142  A complete set of results can be found in Results Review.  Recommendations:  Unable to reach.  Follow-up plan: ICM clinic phone appointment on 12/20/221 to recheck fluid levels.   91 day device clinic remote transmission 09/21/2020.    EP/Cardiology Office Visits: 10/01/2020 with Dr. Aundra Dubin.    Copy of ICM check sent to Dr. Lovena Le.    3 month ICM trend: 07/27/2020    1 Year ICM trend:       Rosalene Billings, RN 07/28/2020 2:22 PM

## 2020-08-03 ENCOUNTER — Ambulatory Visit (INDEPENDENT_AMBULATORY_CARE_PROVIDER_SITE_OTHER): Payer: Medicare Other

## 2020-08-03 DIAGNOSIS — I5022 Chronic systolic (congestive) heart failure: Secondary | ICD-10-CM

## 2020-08-03 DIAGNOSIS — Z950 Presence of cardiac pacemaker: Secondary | ICD-10-CM

## 2020-08-03 NOTE — Progress Notes (Signed)
EPIC Encounter for ICM Monitoring  Patient Name: Melinda Mckinney is a 64 y.o. female Date: 08/03/2020 Primary Care Physican: Colon Branch, MD Primary Cardiologist:[place] Electrophysiologist:Taylor Bi-V Pacing:99.2% 06/30/2020 Weight:207-209lbs   Time in AT/AF24.0 hr/day (100.0%)  Transmission reviewed.   Optivol thoracic impedancesuggesting fluid levels returned to normal.  Prescribed: Torsemide40 mg take 1 tablet daily.  Labs: 06/30/2020 Creatinine1.44, Delice Bison, Potassium3.8, Sodium139, GFR40 06/04/2020 Creatinine0.98, BUN13, Potassium4.0, Sodium140, GFR>60  04/21/2020 Creatinine1.19, BUN14, Potassium4.2, Sodium140  04/10/2020 Creatinine1.13, BUN15, Potassium3.9, Sodium139  01/14/2020 Creatinine1.21, BUN17, Potassium4.4, Sodium142 A complete set of results can be found in Results Review.  Recommendations:No changes.  Follow-up plan: ICM clinic phone appointment on 09/22/2020. 91 day device clinic remote transmission 09/21/2020.   EP/Cardiology Office Visits:2/17/2022with Dr. Aundra Dubin.   Copy of ICM check sent to Dr.Taylor.    3 month ICM trend: 08/03/2020    1 Year ICM trend:       Rosalene Billings, RN 08/03/2020 12:25 PM

## 2020-08-18 ENCOUNTER — Encounter (HOSPITAL_COMMUNITY): Payer: Medicare Other | Admitting: Cardiology

## 2020-08-19 ENCOUNTER — Other Ambulatory Visit (HOSPITAL_COMMUNITY): Payer: Self-pay | Admitting: Cardiology

## 2020-08-24 ENCOUNTER — Other Ambulatory Visit: Payer: Self-pay

## 2020-08-24 MED ORDER — RIVAROXABAN 20 MG PO TABS
20.0000 mg | ORAL_TABLET | Freq: Every day | ORAL | 3 refills | Status: DC
Start: 2020-08-24 — End: 2020-11-12

## 2020-08-25 ENCOUNTER — Other Ambulatory Visit: Payer: Self-pay

## 2020-08-25 ENCOUNTER — Encounter: Payer: Self-pay | Admitting: Internal Medicine

## 2020-08-25 ENCOUNTER — Ambulatory Visit (INDEPENDENT_AMBULATORY_CARE_PROVIDER_SITE_OTHER): Payer: Medicare Other | Admitting: Internal Medicine

## 2020-08-25 VITALS — BP 120/70 | HR 61 | Ht 65.0 in | Wt 212.0 lb

## 2020-08-25 DIAGNOSIS — M810 Age-related osteoporosis without current pathological fracture: Secondary | ICD-10-CM | POA: Diagnosis not present

## 2020-08-25 DIAGNOSIS — E559 Vitamin D deficiency, unspecified: Secondary | ICD-10-CM

## 2020-08-25 DIAGNOSIS — E039 Hypothyroidism, unspecified: Secondary | ICD-10-CM | POA: Diagnosis not present

## 2020-08-25 LAB — VITAMIN D 25 HYDROXY (VIT D DEFICIENCY, FRACTURES): VITD: 66.05 ng/mL (ref 30.00–100.00)

## 2020-08-25 LAB — TSH: TSH: 0.94 u[IU]/mL (ref 0.35–4.50)

## 2020-08-25 LAB — T4, FREE: Free T4: 1.25 ng/dL (ref 0.60–1.60)

## 2020-08-25 NOTE — Patient Instructions (Signed)
Please stop at the lab.  Continue Levothyroxine 125 mcg daily.  Take the thyroid hormone every day, with water, at least 30 minutes before breakfast, separated by at least 4 hours from: - acid reflux medications - calcium - iron - multivitamins  Continue Fosamax 70 mg weekly.   Please call and schedule bone density scan at the [place]: [phone].  Please come back for a follow-up appointment in 1 year.

## 2020-08-25 NOTE — Progress Notes (Signed)
Patient ID: Melinda Mckinney, female   DOB: April 24, 1956, 65 y.o.   MRN: [id number]  This visit occurred during the SARS-CoV-2 public health emergency.  Safety protocols were in place, including screening questions prior to the visit, additional usage of staff PPE, and extensive cleaning of exam room while observing appropriate contact time as indicated for disinfecting solutions.   HPI  Melinda Mckinney is a 65 y.o.-year-old female, returning for f/u for vitamin D deficiency, osteoporosis, and hypothyroidism. Last visit 1 year ago.  She is here with her husband who offers part of the history especially regarding her recent medical history, medication regimen and symptoms.  He also translates for [place].  Prev. Seen by Dr. Lily Peer, now retired.  She had COVID-19 04/2019.   Since last visit, she had a possible hand fracture in 03/2020.  This was not clearly confirmed.  Osteoporosis.   Reviewed DXA scan reports: Date Lumbar spine L1-L4 Femoral neck (FN)  09/06/2018 ([facility]) -2.8 RFN: -1.5 LFN: -1.1   Previously: Date L1-L4 T score FN T score  09/04/2014 ([facility])  -2.8 LFN: -1.1 RFN: -1.2  03/25/2007 ([facility])  -2.6    Previously on Evista since 09/2017 but stopped in 2020 - prev. Rx'ed by Dr. Lily Peer.    Now on Fosamax 70 mg weekly, started 08/2019.  She denies jaw/hip/thigh pain.  Vitamin D def  -Diagnosed in 2000 -No history of hypercalcemia  Reviewed her vitamin D levels: Lab Results  Component Value Date   VD25OH 45.46 08/22/2019   VD25OH 32.55 04/26/2018   VD25OH 40 09/08/2016   VD25OH 29.88 (L) 08/09/2016   VD25OH 24 (L) 09/03/2015   VD25OH 29.63 (L) 08/07/2015   VD25OH 26.02 (L) 01/19/2015   VD25OH 17 (L) 12/12/2014   VD25OH 16 (L) 12/10/2014   VD25OH 24 (L) 09/12/2014   She takes 5000 units vitamin daily.  Reviewed history: Celiac panel was negative in 11/2014.  She has a history of abdominal pain/bloating/nausea/sometimes vomiting. She was seen by GI >> EGD >> had  surgery for achalasia.  Lab Results  Component Value Date   PTH 31 09/12/2014   CALCIUM 9.7 06/30/2020   CALCIUM 9.1 06/04/2020   CALCIUM 9.2 04/21/2020   CALCIUM 9.5 04/10/2020   CALCIUM 10.0 01/14/2020   CALCIUM 10.0 01/03/2020   CALCIUM 9.7 10/04/2019   CALCIUM 10.0 08/22/2019   CALCIUM 9.7 08/14/2019   CALCIUM 9.4 07/30/2019   She has a history of kidney stones. R nephrectomy for hydronephrosis 2/2 kidney stone.  At that time, she also had surgery for incarcerated hernia - had several hernias  + CKD: Lab Results  Component Value Date   BUN 32 (H) 06/30/2020   CREATININE 1.44 (H) 06/30/2020   Hypothyroidism:  At last visit, we increased her levothyroxine from 125 to 137 mcg, but the TSH returned suppressed so PCP decrease the dose back to 125 mcg daily.  She now takes this dose. - in am - fasting - at least 30 min from b'fast - no calcium - + iron - added 3-4h after LT4 - no multivitamins - + PPIs -first dose at least 3 hours after LT4 - not on Biotin  Reviewed her TFTs: Lab Results  Component Value Date   TSH 1.33 05/20/2020   TSH 0.18 (L) 03/18/2020   TSH 1.232 10/04/2019   TSH 5.07 (H) 08/22/2019   TSH 8.342 (H) 06/18/2019   TSH 1.627 05/25/2019   TSH 1.467 04/30/2019   TSH 1.00 10/17/2018   TSH 0.60 04/26/2018  TSH 0.61 10/04/2017   Pt denies: - feeling nodules in neck - hoarseness - choking - SOB with lying down She has a history of dysphagia, believed to be due to GERD.  She is on PPIs. She is scheduled for EGD and colonoscopy next month.  + FH with thyroid ds: Mother, daughter, and son. No FH of thyroid cancer. No h/o radiation tx to head or neck.  No herbal supplements. No Biotin use. No recent steroids use.   She also has a history of prior nephrectomy, osteoarthritis, A fib, history of acute CHF.  On metoprolol and Xarelto.  She had 2 previous cardioversions in the past.  ROS: Constitutional: no weight gain/no weight loss, no fatigue, no  subjective hyperthermia, no subjective hypothermia Eyes: no blurry vision, no xerophthalmia ENT: no sore throat, + see HPI Cardiovascular: no CP/no SOB/no palpitations/no leg swelling Respiratory: no cough/no SOB/no wheezing Gastrointestinal: no N/no V/no D/+ C due to iron/no acid reflux Musculoskeletal: no muscle aches/no joint aches Skin: no rashes, no hair loss Neurological: + tremors/no numbness/no tingling/no dizziness  I reviewed pt's medications, allergies, PMH, social hx, family hx, and changes were documented in the history of present illness. Otherwise, unchanged from my initial visit note.  Past Medical History:  Diagnosis Date  . Anemia   . Anxiety and depression   . Asthma   . Borderline diabetes   . CAD (coronary artery disease) ~2009   stent   . Cervical cancer ([place])    "stage 1; had cryotherapy"  . Chronic lower back pain   . Complete heart block ([place]) 06/2019   s/p AV nodal ablation and CRT-P by Dr Lovena Le  . DDD (degenerative disc disease), lumbar    Dr. Nelva Bush, recommends lumbar epidural steroid injections L5-S1 to the right  . Depression   . Fibromyalgia   . GERD (gastroesophageal reflux disease)   . High cholesterol   . History of hiatal hernia   . History of kidney stones   . Hypertension   . Hypothyroidism   . Osteoporosis   . Permanent atrial fibrillation ([place]) 07/2017   s/p AV nodal ablation  . Thyroid disease    Past Surgical History:  Procedure Laterality Date  . AV NODE ABLATION N/A 06/24/2019   Procedure: AV NODE ABLATION;  Surgeon: Evans Lance, MD;  Location: [facility];  Service: Cardiovascular;  Laterality: N/A;  . BIV PACEMAKER INSERTION CRT-P N/A 06/24/2019   Procedure: BIV PACEMAKER INSERTION CRT-P;  Surgeon: Evans Lance, MD;  Location: [facility];  Service: Cardiovascular;  Laterality: N/A;  . CARDIAC CATHETERIZATION  ~ 2008  . CARDIOVERSION N/A 08/05/2017   Procedure: CARDIOVERSION;  Surgeon: Evans Lance, MD;   Location: [facility];  Service: Cardiovascular;  Laterality: N/A;  . CARDIOVERSION N/A 08/24/2017   Procedure: CARDIOVERSION;  Surgeon: Sanda Klein, MD;  Location: [facility];  Service: Cardiovascular;  Laterality: N/A;  . CARDIOVERSION N/A 06/19/2019   Procedure: CARDIOVERSION;  Surgeon: Larey Dresser, MD;  Location: [facility];  Service: Cardiovascular;  Laterality: N/A;  . CARDIOVERSION N/A 06/21/2019   Procedure: CARDIOVERSION;  Surgeon: Larey Dresser, MD;  Location: [facility];  Service: Cardiovascular;  Laterality: N/A;  . [place]; ~ 1978/1979; 1984  . ESOPHAGOMYOTOMY  2009  . GYNECOLOGIC CRYOSURGERY  X 2   "stage 1 cancer"  . HERNIA REPAIR  X 6   "all in my stomach" (07/27/2017)  . KNEE ARTHROSCOPY Right   . NEPHRECTOMY Right  79   in [place], due to fibrosis and lithiasis  . RIGHT/LEFT HEART CATH AND CORONARY ANGIOGRAPHY N/A 06/17/2019   Procedure: RIGHT/LEFT HEART CATH AND CORONARY ANGIOGRAPHY;  Surgeon: [place]retta Harp, MD;  Location: [facility];  Service: Cardiovascular;  Laterality: N/A;  . TEE WITHOUT CARDIOVERSION N/A 06/19/2019   Procedure: TRANSESOPHAGEAL ECHOCARDIOGRAM (TEE);  Surgeon: Larey Dresser, MD;  Location: [facility];  Service: Cardiovascular;  Laterality: N/A;  . TUBAL LIGATION     Social History   Socioeconomic History  . Marital status: Married    Spouse name: Not on file  . Number of children: 3  . Years of education: Not on file  . Highest education level: Not on file  Occupational History  . 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 Smoking status: Former Smoker    Packs/day: 2.00    Years: 28.00    Pack years: 56.00    Types: Cigarettes    Quit date: 2000    Years since quitting: 22.0  . Smokeless tobacco: Never Used  Vaping Use  . Vaping Use: Never used  Substance and Sexual Activity  . Alcohol use: Yes    Alcohol/week: 5.0 standard drinks    Types: 5 Cans of beer per week    Comment: 07/27/2017 "3-4  drinks//month"  . Drug use: No  . Sexual activity: Not Currently    Comment: 1ST INTERCOURSE- 16, PARTNERS - 2  Other Topics Concern  . Not on file  Social History Narrative   Original from [place]   3 children, 2 alive   Household --pt and husband    Social Determinants of Radio broadcast assistant Strain: Not on file  Food Insecurity: Not on file  Transportation Needs: Not on file  Physical Activity: Not on file  Stress: Not on file  Social Connections: Not on file  Intimate Partner Violence: Not on file   Current Outpatient Medications on File Prior to Visit  Medication Sig Dispense Refill  . alendronate (FOSAMAX) 70 MG tablet Take 1 tablet (70 mg total) by mouth every 7 (seven) days. Take with a full glass of water on an empty stomach. 12 tablet 3  . allopurinol (ZYLOPRIM) 100 MG tablet TAKE ONE TABLET BY MOUTH DAILY 90 tablet 0  . atorvastatin (LIPITOR) 40 MG tablet Take 1.5 tablet by mouth daily at bedtime 135 tablet 1  . carvedilol (COREG) 6.25 MG tablet TAKE ONE TABLET BY MOUTH TWICE A DAY WITH MEALS 180 tablet 3  . Cholecalciferol (VITAMIN D3) 5000 UNITS CAPS Take 5,000 Units by mouth daily.     . clobetasol cream (TEMOVATE) 0.05 %     . cyclobenzaprine (FLEXERIL) 10 MG tablet Take 1 tablet (10 mg total) by mouth 2 (two) times daily as needed for muscle spasms. 60 tablet 1  . dapagliflozin propanediol (FARXIGA) 10 MG TABS tablet Take 1 tablet (10 mg total) by mouth daily before breakfast. 30 tablet 3  . DULoxetine (CYMBALTA) 60 MG capsule Take 1 capsule (60 mg total) by mouth daily. 90 capsule 1  . ENTRESTO 97-103 MG TAKE ONE TABLET BY MOUTH TWICE A DAY 60 tablet 3  . ferrous fumarate (HEMOCYTE - 106 MG FE) 325 (106 Fe) MG TABS tablet Take 1 tablet (106 mg of iron total) by mouth 2 (two) times daily. Do not take at the same time as omeprazole (Prilosec) 60 tablet 6  . ketoconazole (NIZORAL) 2 % cream Apply 1 application topically daily. [place]  g 0  . levothyroxine  (SYNTHROID) 125 MCG tablet Take 1 tablet (125 mcg total) by mouth daily before breakfast. 90 tablet 1  . metFORMIN (GLUCOPHAGE) 500 MG tablet Take 1 tablet (500 mg total) by mouth 2 (two) times daily with a meal. 180 tablet 1  . omeprazole (PRILOSEC) 20 MG capsule Take 1 capsule (20 mg total) by mouth 2 (two) times daily before a meal. 180 capsule 3  . rivaroxaban (XARELTO) 20 MG TABS tablet Take 1 tablet (20 mg total) by mouth daily with supper. 90 tablet 3  . spironolactone (ALDACTONE) 25 MG tablet TAKE ONE TABLET BY MOUTH DAILY 90 tablet 3  . torsemide (DEMADEX) 20 MG tablet Take 2 tablets (40 mg total) by mouth daily. 180 tablet 3  . zolpidem (AMBIEN) 5 MG tablet TAKE ONE TABLET BY MOUTH AT BEDTIME AS NEEDED 90 tablet 0   No current facility-administered medications on file prior to visit.   Allergies  Allergen Reactions  . Penicillins Swelling and Rash    Has patient had a PCN reaction causing immediate rash, facial/tongue/throat swelling, SOB or lightheadedness with hypotension: No Has patient had a PCN reaction causing severe rash involving mucus membranes or skin necrosis: No Has patient had a PCN reaction that required hospitalization: No Has patient had a PCN reaction occurring within the last 10 years: No If all of the above answers are "NO", then may proceed with Cephalosporin use.   Family History  Problem Relation Age of Onset  . Breast cancer Other        aunt   . CAD Brother   . Lung cancer Mother        F and M  . Diabetes Father        F and mother   . CAD Father   . Lung cancer Father   . Stroke Sister   . Colon cancer Neg Hx    PE: BP 120/70   Pulse 61   Ht 5\' 5"  (1.651 m)   Wt 212 lb (96.2 kg)   SpO2 98%   BMI 35.28 kg/m  Body mass index is 35.28 kg/m. Wt Readings from Last 3 Encounters:  08/25/20 212 lb (96.2 kg)  06/30/20 209 lb 9.6 oz (95.1 kg)  06/18/20 219 lb 2 oz (99.4 kg)   Constitutional: overweight, in NAD Eyes: PERRLA, EOMI, no  exophthalmos ENT: moist mucous membranes, no thyromegaly, no cervical lymphadenopathy Cardiovascular: RRR, No MRG Respiratory: CTA B Gastrointestinal: abdomen soft, NT, ND, BS+ Musculoskeletal: no deformities, strength intact in all 4 Skin: moist, warm, no rashes Neurological: + tremor with outstretched hands, DTR normal in all 4  Assessment: 1.  Osteoporosis  2. Vitamin D deficiency -Reviewing her chart, there is no obvious reason for malabsorption: Celiac workup was negative, no colon diseases, she had a history of Barrett's esophagus and has surveillance EGDs and also had achalasia surgery  3. Hypothyroidism  PLAN: 1. Osteoporosis -No confirmed fractures (she was investigated for a hand fracture in 03/2020) or falls since last visit. -I reviewed the report of her DEXA scan from 08/2018: Lumbar spine T score was low, at -2.8, but stable.  Right femoral neck bone density was lower than before, however, the last 2 bone density scans were done at different's machines, so they are not directly comparable. -She was on Evista in the past, for 4 years, but she stopped before our last visit.  We started alendronate 70 mg daily.  She tolerates this well.  No thigh/hip/jaw  pain.  I advised her to take this before levothyroxine. -She is due for another bone density scan-we will order today at our [facility] office (this needs to be done after 09/06/2020) -We again discussed about the importance of exercise.  At last visit she was not exercising but I advised her to start to improve her muscles around the knee and improve joint stability. -We will check a BMP and a vitamin D level today  2.  Vitamin D insufficiency -She was previously on high-dose ergocalciferol once a week in the past, but now on daily cholecalciferol -Continues on 5000 units vitamin D daily -Vitamin D at last visit was normal -We will repeat the level today  3. Hypothyroidism - latest thyroid labs reviewed with pt >>  normal: Lab Results  Component Value Date   TSH 1.33 05/20/2020   - she continues on LT4 137 mcg daily, increased after last visit.  However, TSH was suppressed in 03/2020 so PCP decrease the dose to 112 mcg daily.  TSH was normal on this dose in 05/2020 (see above) - pt feels good on this dose.  She does have constipation, but this is most likely due to from iron therapy.  I advised her to start taking MiraLAX OTC. - we discussed about taking the thyroid hormone every day, with water, >30 minutes before breakfast, separated by >4 hours from acid reflux medications, calcium, iron, multivitamins. Pt. is taking it correctly. - will check thyroid tests today: TSH and fT4 - If labs are abnormal, she will need to return for repeat TFTs in 1.5 months  Component     Latest Ref Rng & Units 08/25/2020          Glucose     65 - 99 mg/dL 114 (H)  BUN     7 - 25 mg/dL 52 (H)  Creatinine     0.50 - 0.99 mg/dL 1.65 (H)  GFR, Est Non African American     > OR = 60 mL/min/1.52m2 32 (L)  GFR, Est African American     > OR = 60 mL/min/1.33m2 37 (L)  BUN/Creatinine Ratio     6 - 22 (calc) 32 (H)  Sodium     135 - 146 mmol/L 140  Potassium     3.5 - 5.3 mmol/L 4.0  Chloride     98 - 110 mmol/L 100  CO2     20 - 32 mmol/L 29  Calcium     8.6 - 10.4 mg/dL 9.5  TSH     0.35 - 4.50 uIU/mL 0.94  VITD     30.00 - 100.00 ng/mL 66.05  T4,Free(Direct)     0.60 - 1.60 ng/dL 1.25   Vitamin D and TFTs are normal.  Kidney function is slightly lower, but still above 30, so we can continue alendronate. I will check with her if she is still seeing nephrology.  Philemon Kingdom, MD PhD [facility] Endocr[facility]ology

## 2020-08-26 LAB — BASIC METABOLIC PANEL WITH GFR
BUN/Creatinine Ratio: 32 (calc) — ABNORMAL HIGH (ref 6–22)
BUN: 52 mg/dL — ABNORMAL HIGH (ref 7–25)
CO2: 29 mmol/L (ref 20–32)
Calcium: 9.5 mg/dL (ref 8.6–10.4)
Chloride: 100 mmol/L (ref 98–110)
Creat: 1.65 mg/dL — ABNORMAL HIGH (ref 0.50–0.99)
GFR, Est African American: 37 mL/min/{1.73_m2} — ABNORMAL LOW (ref >=60)
GFR, Est Non African American: 32 mL/min/{1.73_m2} — ABNORMAL LOW (ref >=60)
Glucose, Bld: 114 mg/dL — ABNORMAL HIGH (ref 65–99)
Potassium: 4 mmol/L (ref 3.5–5.3)
Sodium: 140 mmol/L (ref 135–146)

## 2020-09-17 ENCOUNTER — Other Ambulatory Visit: Payer: Self-pay | Admitting: Internal Medicine

## 2020-09-17 ENCOUNTER — Other Ambulatory Visit (HOSPITAL_COMMUNITY): Payer: Self-pay | Admitting: Cardiology

## 2020-09-21 ENCOUNTER — Ambulatory Visit (INDEPENDENT_AMBULATORY_CARE_PROVIDER_SITE_OTHER): Payer: Medicare Other

## 2020-09-21 DIAGNOSIS — I428 Other cardiomyopathies: Secondary | ICD-10-CM

## 2020-09-22 ENCOUNTER — Ambulatory Visit (INDEPENDENT_AMBULATORY_CARE_PROVIDER_SITE_OTHER): Payer: Medicare Other

## 2020-09-22 DIAGNOSIS — Z950 Presence of cardiac pacemaker: Secondary | ICD-10-CM | POA: Diagnosis not present

## 2020-09-22 DIAGNOSIS — I5022 Chronic systolic (congestive) heart failure: Secondary | ICD-10-CM

## 2020-09-22 LAB — CUP PACEART REMOTE DEVICE CHECK
Battery Remaining Longevity: 108 mo
Battery Voltage: 2.99 V
Brady Statistic RA Percent Paced: 0 %
Brady Statistic RV Percent Paced: 99.27 %
Date Time Interrogation Session: 20220207002423
Implantable Lead Implant Date: 20201109
Implantable Lead Implant Date: 20201109
Implantable Lead Implant Date: 20201109
Implantable Lead Location: 753858
Implantable Lead Location: 753859
Implantable Lead Location: 753860
Implantable Lead Model: 4598
Implantable Lead Model: 5076
Implantable Lead Model: 5076
Implantable Pulse Generator Implant Date: 20201109
Lead Channel Impedance Value: 1007 Ohm
Lead Channel Impedance Value: 1026 Ohm
Lead Channel Impedance Value: 1045 Ohm
Lead Channel Impedance Value: 1216 Ohm
Lead Channel Impedance Value: 1273 Ohm
Lead Channel Impedance Value: 361 Ohm
Lead Channel Impedance Value: 456 Ohm
Lead Channel Impedance Value: 475 Ohm
Lead Channel Impedance Value: 513 Ohm
Lead Channel Impedance Value: 532 Ohm
Lead Channel Impedance Value: 665 Ohm
Lead Channel Impedance Value: 684 Ohm
Lead Channel Impedance Value: 703 Ohm
Lead Channel Impedance Value: 836 Ohm
Lead Channel Pacing Threshold Amplitude: 0.5 V
Lead Channel Pacing Threshold Amplitude: 1.25 V
Lead Channel Pacing Threshold Pulse Width: 0.4 ms
Lead Channel Pacing Threshold Pulse Width: 0.8 ms
Lead Channel Sensing Intrinsic Amplitude: 1 mV
Lead Channel Sensing Intrinsic Amplitude: 1 mV
Lead Channel Sensing Intrinsic Amplitude: 13.5 mV
Lead Channel Sensing Intrinsic Amplitude: 13.5 mV
Lead Channel Setting Pacing Amplitude: 1.75 V
Lead Channel Setting Pacing Amplitude: 2.5 V
Lead Channel Setting Pacing Pulse Width: 0.4 ms
Lead Channel Setting Pacing Pulse Width: 0.8 ms
Lead Channel Setting Sensing Sensitivity: 2 mV

## 2020-09-22 NOTE — Progress Notes (Signed)
EPIC Encounter for ICM Monitoring  Patient Name: Melinda Mckinney is a 65 y.o. female Date: 09/22/2020 Primary Care Physican: Colon Branch, MD Primary Cardiologist:[place] Electrophysiologist:Taylor Bi-V Pacing:99.3% 06/30/2020 Weight:207-209lbs   Time in AT/AF24.0 hr/day (100.0%)  Attempted call to patient using Spanish interpreter. Message left to return call.   Optivol thoracic impedancesuggesting possible fluid accumulation since 09/14/2020.  Fluid index > threshold ongoing since 29-Aug-2020.  Prescribed:Torsemide20 mg take 2 tablets (40 mg total) daily.  Labs: 06/30/2020 Creatinine1.44, Delice Bison, Potassium3.8, Sodium139, GFR40 06/04/2020 Creatinine0.98, BUN13, Potassium4.0, Sodium140, GFR>60  04/21/2020 Creatinine1.19, BUN14, Potassium4.2, Sodium140  04/10/2020 Creatinine1.13, BUN15, Potassium3.9, Sodium139  01/14/2020 Creatinine1.21, BUN17, Potassium4.4, Sodium142 A complete set of results can be found in Results Review.  Recommendations:Unable to reach.   Copy sent to Dr Aundra Dubin for review and recommendations if needed.  Follow-up plan: ICM clinic phone appointment on2/14/2022 to recheck fluid levels. 91 day device clinic remote transmission 12/21/2020.   EP/Cardiology Office Visits:2/17/2022with Dr. Aundra Dubin.   Copy of ICM check sent to Dr.Taylor.  3 month ICM trend: 09/22/2020.    1 Year ICM trend:       Rosalene Billings, RN 09/22/2020 2:44 PM

## 2020-09-23 MED ORDER — POTASSIUM CHLORIDE CRYS ER 20 MEQ PO TBCR: 20.0000 meq | EXTENDED_RELEASE_TABLET | Freq: Every day | ORAL | 3 refills | Status: DC

## 2020-09-23 MED ORDER — TORSEMIDE 20 MG PO TABS: ORAL_TABLET | ORAL | 3 refills | Status: DC

## 2020-09-23 NOTE — Progress Notes (Signed)
Spoke with husband, no interpreter needed.  Advised of Dr Claris Gladden recommendations to increase Torsemide 20 mg to 2 tablets every morning and 1 tablet every evening.  Also adding new prescription of Potassium 20 mEq take 1 tablet every morning. Explained why taking potassium is important.  BMET will be done at 10/01/2020 office visit with Dr Aundra Dubin.  Patient has supply on hand for Torsemide.  Patient is feeling well and denies any fluid symptoms.  Current weight is stable at  207 lbs.  Advised to limit salt intake and explained too much salt in the diet can contribute to fluid retention.  Advised if he has any questions to call back.

## 2020-09-23 NOTE — Progress Notes (Signed)
BMET 1 week.

## 2020-09-23 NOTE — Progress Notes (Signed)
Increase torsemide to 40 qam/20 qpm and add KCl 20 daily.

## 2020-09-25 NOTE — Progress Notes (Signed)
Remote pacemaker transmission.   

## 2020-09-28 ENCOUNTER — Ambulatory Visit (INDEPENDENT_AMBULATORY_CARE_PROVIDER_SITE_OTHER): Payer: Medicare Other

## 2020-09-28 DIAGNOSIS — I5022 Chronic systolic (congestive) heart failure: Secondary | ICD-10-CM

## 2020-09-28 DIAGNOSIS — Z950 Presence of cardiac pacemaker: Secondary | ICD-10-CM

## 2020-09-30 DIAGNOSIS — B9689 Other specified bacterial agents as the cause of diseases classified elsewhere: Secondary | ICD-10-CM | POA: Diagnosis not present

## 2020-09-30 DIAGNOSIS — L900 Lichen sclerosus et atrophicus: Secondary | ICD-10-CM | POA: Diagnosis not present

## 2020-10-01 ENCOUNTER — Encounter (HOSPITAL_COMMUNITY): Payer: Self-pay | Admitting: Cardiology

## 2020-10-01 ENCOUNTER — Ambulatory Visit (HOSPITAL_COMMUNITY)
Admission: RE | Admit: 2020-10-01 | Discharge: 2020-10-01 | Disposition: A | Discharge status: Home / Self Care | Payer: Medicare Other | Source: Ambulatory Visit | Attending: Cardiology | Admitting: Cardiology

## 2020-10-01 ENCOUNTER — Other Ambulatory Visit (HOSPITAL_COMMUNITY): Payer: Self-pay | Admitting: Adult Health

## 2020-10-01 ENCOUNTER — Other Ambulatory Visit: Payer: Self-pay

## 2020-10-01 VITALS — BP 118/70 | HR 72 | Wt 212.4 lb

## 2020-10-01 DIAGNOSIS — Z8616 Personal history of COVID-19: Secondary | ICD-10-CM | POA: Diagnosis not present

## 2020-10-01 DIAGNOSIS — Z7984 Long term (current) use of oral hypoglycemic drugs: Secondary | ICD-10-CM | POA: Diagnosis not present

## 2020-10-01 DIAGNOSIS — I428 Other cardiomyopathies: Secondary | ICD-10-CM | POA: Insufficient documentation

## 2020-10-01 DIAGNOSIS — Z7901 Long term (current) use of anticoagulants: Secondary | ICD-10-CM | POA: Insufficient documentation

## 2020-10-01 DIAGNOSIS — N1832 Chronic kidney disease, stage 3b: Secondary | ICD-10-CM | POA: Insufficient documentation

## 2020-10-01 DIAGNOSIS — Z8249 Family history of ischemic heart disease and other diseases of the circulatory system: Secondary | ICD-10-CM | POA: Insufficient documentation

## 2020-10-01 DIAGNOSIS — Z87891 Personal history of nicotine dependence: Secondary | ICD-10-CM | POA: Insufficient documentation

## 2020-10-01 DIAGNOSIS — Z79899 Other long term (current) drug therapy: Secondary | ICD-10-CM | POA: Diagnosis not present

## 2020-10-01 DIAGNOSIS — I2510 Atherosclerotic heart disease of native coronary artery without angina pectoris: Secondary | ICD-10-CM | POA: Diagnosis not present

## 2020-10-01 DIAGNOSIS — I4819 Other persistent atrial fibrillation: Secondary | ICD-10-CM | POA: Diagnosis not present

## 2020-10-01 DIAGNOSIS — I340 Nonrheumatic mitral (valve) insufficiency: Secondary | ICD-10-CM | POA: Diagnosis not present

## 2020-10-01 DIAGNOSIS — E039 Hypothyroidism, unspecified: Secondary | ICD-10-CM | POA: Insufficient documentation

## 2020-10-01 DIAGNOSIS — I5022 Chronic systolic (congestive) heart failure: Secondary | ICD-10-CM | POA: Insufficient documentation

## 2020-10-01 DIAGNOSIS — Z1231 Encounter for screening mammogram for malignant neoplasm of breast: Secondary | ICD-10-CM | POA: Diagnosis not present

## 2020-10-01 HISTORY — DX: Heart failure, unspecified: I50.9

## 2020-10-01 LAB — HM MAMMOGRAPHY

## 2020-10-01 LAB — BASIC METABOLIC PANEL
Anion gap: 12 (ref 5–15)
BUN: 54 mg/dL — ABNORMAL HIGH (ref 8–23)
CO2: 27 mmol/L (ref 22–32)
Calcium: 10.5 mg/dL — ABNORMAL HIGH (ref 8.9–10.3)
Chloride: 99 mmol/L (ref 98–111)
Creatinine, Ser: 1.9 mg/dL — ABNORMAL HIGH (ref 0.44–1.00)
GFR, Estimated: 29 mL/min — ABNORMAL LOW (ref >60)
Glucose, Bld: 124 mg/dL — ABNORMAL HIGH (ref 70–99)
Potassium: 5 mmol/L (ref 3.5–5.1)
Sodium: 138 mmol/L (ref 135–145)

## 2020-10-01 MED ORDER — TORSEMIDE 20 MG PO TABS: 40.0000 mg | ORAL_TABLET | Freq: Every day | ORAL | 3 refills | Status: DC

## 2020-10-01 NOTE — Progress Notes (Signed)
PCP: Colon Branch, MD  [facility] Cardiology: Dr. Aundra Dubin  65 y.o. with CAD s/p PCI in 2010, chronic combined systolic and diastolic HF (EF 81-85% in 2018), atrial fibrillation on chronic anticoagulation w/ Xarelto, HTN, and hypothyroidism was diagnosed w/ COVID-19 in Sep 2020 and treated at [place] infection c/b PNA. She was discharged and readmitted back to [facility] 10/9-10/13/20 for gastroenteritis, felt to be a sequela of COVID-19. She was managed w/ supportive care and discharged home. Had f/u with PCP 10/22 and endorsed worsening LLQ pain leading to abdominal CT which showed acute diverticulitis. She was started on outpatient antibiotics, cipro + flagyl but symptoms failed to improve, prompting her to seek emergency medical care at [facility].  Initial EKG in the ED showed afib w/ RVR for which general cardiology was consulted. She was started on IV dilt for rate control but later required treatment w/ IV amiodarone due to persistent rapid ventricular response. Hospital course c/b volume overload, requiring IV diuretics. 2D echo was obtained and showed worsening LVEF, now 25-30% w/ moderate MR. She underwent subsequent R/LHC in 11/20 which demonstrated widely patent coronaries. RHC showed elevated mean RA pressure at 22, PWP 43, LVEDP 45 and low CO. CI by Fick 1.6. She underwent TEE-guided DCCV, TEE showed EF 15% with moderate RV dysfunction and moderate MR.  She went back into rapid atrial fibrillation.  She failed additional cardioversions.  Despite amiodarone gtt, HR remained in the 140s-150s.  Finally, she underwent AV nodal ablation with BiV pacing due to inability to control atrial fibrillation. While in the hospital, she was on milrinone gtt for a period of time, but we were able to taper it off. She was discharged home after medication optimization.   Echo in 2/21 showed EF up to 40-45%, mild diffuse hypokinesis, RV low normal function.   Patient presents for followup of CHF.  Weight is  up 3 lbs.  No dyspnea walking around in house or walking into office today.  She is short of breath after walking about 2 blocks.  No chest pain. She does have chronic orthopnea.  Generalized fatigue.  She sleeps poorly and snores.     Labs (11/20): digoxin level 1.4, creatinine 1.81, K 4.8, hgb 13.9 Labs (1/21): LDL 91, K 3.7, creatinine 1.32 Labs (2/21): K 5.1, creatinine 1.37, TSH normal, digoxin 0.4 Labs (5/21): LDL 68, digoxin 0.3 Labs (6/21): K 4.4, creatinine 1.21 Labs (1/22): K 4, creatinine 1.65, TSH normal  Medtronic device interrogation: >99% BiV pacing, 100% atrial fibrillation, fluid index < threshold.    PMH: 1. Hypothyroidism 2. Single kidney s/p nephrectomy 3. Atrial fibrillation: Initially diagnosed in 2018.  She was on sotalol at one point to maintain NSR.  - Uncontrollable atrial fibrillation during 11/20 admission, patient eventually had AV nodal ablation with CRT-P implantation.  4. H/o achalasia 5. GERD 6. Fibromyalgia 7. H/o diverticulitis in 10/20 8. COVID-19 in 9/20.  9. H/o cervical cancer: cryotherapy.  10. Degenerative disc disease.  11. Chronic systolic CHF: Nonischemic cardiomyopathy, possibly tachycardia-mediated in setting of uncontrolled atrial fibrillation. Medtronic CRT-P device.  - Echo (2018): EF 45-50%.  - Echo (11/20): EF 25-30%, mildly decreased RV systolic function, moderate MR.  - TEE (11/20): EF 15%, moderately decreased RV systolic function, moderate MR.  - Cardiac MRI (11/20): EF 34%, RV EF 30%, no LGE (done after AV nodal ablation and BiV pacing).  - RHC (11/20): mean RA 22, mean PCWP 43, LVEDP 45, CI 1.6 (Fick) and 2.7 (thermodilution).  -  Echo (2/21): EF 40-45%, mild diffuse hypokinesis, low normal RV systolic function.  12. CAD: PCI in 2010.  - LHC (11/20): No significant coronary disease.   Social History   Socioeconomic History  . Marital status: Married    Spouse name: Not on file  . Number of children: 3  . Years of  education: Not on file  . Highest education level: Not on file  Occupational History  . 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 Smoking status: Former Smoker    Packs/day: 2.00    Years: 28.00    Pack years: 56.00    Types: Cigarettes    Quit date: 2000    Years since quitting: 22.1  . Smokeless tobacco: Never Used  Vaping Use  . Vaping Use: Never used  Substance and Sexual Activity  . Alcohol use: Yes    Alcohol/week: 5.0 standard drinks    Types: 5 Cans of beer per week    Comment: 07/27/2017 "3-4 drinks//month"  . Drug use: No  . Sexual activity: Not Currently    Comment: 1ST INTERCOURSE- 16, PARTNERS - 2  Other Topics Concern  . Not on file  Social History Narrative   Original from [place]   3 children, 2 alive   Household --pt and husband    Social Determinants of Radio broadcast assistant Strain: Not on file  Food Insecurity: Not on file  Transportation Needs: Not on file  Physical Activity: Not on file  Stress: Not on file  Social Connections: Not on file  Intimate Partner Violence: Not on file   Family History  Problem Relation Age of Onset  . Breast cancer Other        aunt   . CAD Brother   . Lung cancer Mother        F and M  . Diabetes Father        F and mother   . CAD Father   . Lung cancer Father   . Stroke Sister   . Colon cancer Neg Hx    ROS: All systems reviewed and negative except as per HPI.   Current Outpatient Medications  Medication Sig Dispense Refill  . alendronate (FOSAMAX) 70 MG tablet Take 1 tablet (70 mg total) by mouth every 7 (seven) days. Take with a full glass of water on an empty stomach. 12 tablet 3  . allopurinol (ZYLOPRIM) 100 MG tablet Take 1 tablet (100 mg total) by mouth daily. 90 tablet 1  . atorvastatin (LIPITOR) 40 MG tablet Take 1.5 tablet by mouth daily at bedtime 135 tablet 1  . carvedilol (COREG) 6.25 MG tablet TAKE ONE TABLET BY MOUTH TWICE A DAY WITH MEALS 180 tablet 3  . Cholecalciferol (VITAMIN D3)  5000 UNITS CAPS Take 5,000 Units by mouth daily.     . clobetasol cream (TEMOVATE) 0.05 %     . cyclobenzaprine (FLEXERIL) 10 MG tablet Take 1 tablet (10 mg total) by mouth 2 (two) times daily as needed for muscle spasms. 60 tablet 1  . DULoxetine (CYMBALTA) 60 MG capsule Take 1 capsule (60 mg total) by mouth daily. 90 capsule 1  . ENTRESTO 97-103 MG TAKE ONE TABLET BY MOUTH TWICE A DAY 60 tablet 3  . ferrous fumarate (HEMOCYTE - 106 MG FE) 325 (106 Fe) MG TABS tablet Take 1 tablet (106 mg of iron total) by mouth 2 (two) times daily. Do not take at the same time as omeprazole (Prilosec) 60 tablet 6  .  ketoconazole (NIZORAL) 2 % cream Apply 1 application topically daily. 60 g 0  . levothyroxine (SYNTHROID) 125 MCG tablet Take 1 tablet (125 mcg total) by mouth daily before breakfast. 90 tablet 1  . metFORMIN (GLUCOPHAGE) 500 MG tablet Take 1 tablet (500 mg total) by mouth 2 (two) times daily with a meal. 180 tablet 1  . omeprazole (PRILOSEC) 20 MG capsule Take 1 capsule (20 mg total) by mouth 2 (two) times daily before a meal. 180 capsule 3  . potassium chloride SA (KLOR-CON) 20 MEQ tablet Take 1 tablet (20 mEq total) by mouth daily. 90 tablet 3  . rivaroxaban (XARELTO) 20 MG TABS tablet Take 1 tablet (20 mg total) by mouth daily with supper. 90 tablet 3  . spironolactone (ALDACTONE) 25 MG tablet TAKE ONE TABLET BY MOUTH DAILY 90 tablet 3  . FARXIGA 10 MG TABS tablet TAKE ONE TABLET BY MOUTH DAILY BEFORE BREAKFAST 30 tablet 3  . torsemide (DEMADEX) 20 MG tablet Take 2 tablets (40 mg total) by mouth daily. 180 tablet 3   No current facility-administered medications for this encounter.   BP 118/70   Pulse 72   Wt 96.3 kg (212 lb 6.4 oz)   SpO2 98%   BMI 35.35 kg/m  General: NAD Neck: No JVD, no thyromegaly or thyroid nodule.  Lungs: Clear to auscultation bilaterally with normal respiratory effort. CV: Nondisplaced PMI.  Heart regular S1/S2, no S3/S4, no murmur.  No peripheral edema.  No  carotid bruit.  Normal pedal pulses.  Abdomen: Soft, nontender, no hepatosplenomegaly, no distention.  Skin: Intact without lesions or rashes.  Neurologic: Alert and oriented x 3.  Psych: Normal affect. Extremities: No clubbing or cyanosis.  HEENT: Normal.   Assessment/Plan: 1. Chronic systolic CHF: Nonischemic cardiomyopathy. Echo in 2018 with EF 45-50%. Echo in 11/20 with EF 25-30%, diffuse hypokinesis, mildly decreased RV systolic function, moderate MR. LHC/RHC in 11/20 with no significant coronary disease, markedly elevated filling pressures and low cardiac output (CI 1.6). 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 Cardiac MRI 11/20 showed LVEF 34%, RVEF 30%, no LGE => perhaps pointing to tachy-mediated CMP as more likely etiology.  Initially required inotropic support w/ milrinone but able to titrate off.  Now s/p AV nodal ablation with MDT CRT-P.  Echo in 2/21 showed EF up to 40-45%.  NYHA class II symptoms, not volume overloaded by exam or Optivol.   - I think that she can decrease torsemide to 40 mg daily.  - Continue Entresto to 97/103 bid.  BMET today.  - Continue spironolactone to 25 daily.  - Continue dapagliflozin 10 mg daily.  - Continue Coreg 6.25 mg bid.   - I will arrange for echocardiogram.  2. Atrial fibrillation: Persistent atrial fibrillation, it appears, at least since 9/20 when she was admitted with COVID-19. Prior, she had paroxysmal atrial fibrillation and was maintained on sotalol. She is now off sotalol.  Unable to cardiovert or rate control atrial fibrillation. Therefore, she had AV nodal ablation with BiV pacing.  - Now off amiodarone.   - With GFR < 50, need to decrease Xarelto to 15 mg daily.  3. Mitral regurgitation: Functional MR, moderate on TEE.  Rate was controlled on cardiac MRI, MR was less impressive.  Minimal MR on echo in 2/21.  4. CKD  stage 3b: BMET today.  5. Suspect OSA: I will arrange for home sleep study.  Followup with NP/PA in 6 wks.   [name] 10/01/2020

## 2020-10-01 NOTE — Patient Instructions (Addendum)
Labs done today. We will contact you only if your labs are abnormal.  DECREASE Torsemide 40mg  (2 tablets) by mouth daily.  No other medication changes were made. Please continue all current medications as prescribed.  Your physician recommends that you schedule a follow-up appointment in: 6 weeks with [facility] here in office with an echo prior to your appointment.  Your physician has requested that you have an echocardiogram. Echocardiography is a painless test that uses sound waves to create images of your heart. It provides your doctor with information about the size and shape of your heart and how well your heart's chambers and valves are working. This procedure takes approximately one hour. There are no restrictions for this procedure.  Your provider has recommended that you have a home sleep study.  We have provided you with the equipment in our office today. Please download the app and follow the instructions. YOUR PIN NUMBER IS: [id number]. Once you have completed the test you just dispose of the equipment, the information is automatically uploaded to [place] via blue-tooth technology. If your test is positive for sleep apnea and you need a home CPAP machine you will be contacted by Dr Theodosia Blender office [facility]) to set this up.   If you have any questions or concerns before your next appointment please send [place] a message through [place] or call our office at [phone].    TO LEAVE A MESSAGE FOR THE NURSE SELECT OPTION 2, PLEASE LEAVE A MESSAGE INCLUDING: . YOUR NAME . DATE OF BIRTH . CALL BACK NUMBER . REASON FOR CALL**this is important as we prioritize the call backs  YOU WILL RECEIVE A CALL BACK THE SAME DAY AS LONG AS YOU CALL BEFORE 4:00 PM   Do the following things EVERYDAY: 1) Weigh yourself in the morning before breakfast. Write it down and keep it in a log. 2) Take your medicines as prescribed 3) Eat low salt foods--Limit salt (sodium) to 2000 mg per day.  4) Stay as active  as you can everyday 5) Limit all fluids for the day to less than 2 liters   At the [facility], you and your health needs are our priority. As part of our continuing mission to provide you with exceptional heart care, we have created designated Provider Care Teams. These Care Teams include your primary Cardiologist (physician) and Advanced Practice Providers (APPs- Physician Assistants and Nurse Practitioners) who all work together to provide you with the care you need, when you need it.   You may see any of the following providers on your designated Care Team at your next follow up: Marland Kitchen Dr Glori Bickers . Dr [name] . Darrick Grinder, NP . Lyda Jester, PA . Audry Riles, [name]   Please be sure to bring in all your medications bottles to every appointment.

## 2020-10-02 NOTE — Progress Notes (Signed)
EPIC Encounter for ICM Monitoring  Patient Name: Melinda Mckinney is a 65 y.o. female Date: 10/02/2020 Primary Care Physican: Colon Branch, MD Primary Cardiologist:[place] Electrophysiologist:Taylor Bi-V Pacing:99.5% 10/01/2020 Office Weight:212 lbs   Time in AT/AF24.0 hr/day (100.0%)  Recommendations given at 10/01/2020 OV with Dr Aundra Dubin.  Optivol thoracic impedancesuggesting fluid levels returned to normal.  Prescribed:  Torsemide20 mg take 2 tablets (40 mg total) daily.  Potassium 20 mEq take 1 tablet daily.  Spironolactone 25 mg take 1 tablet daily  Labs: 06/30/2020 Creatinine1.44, BUN32, Potassium3.8, Sodium139, GFR40 06/04/2020 Creatinine0.98, BUN13, Potassium4.0, Sodium140, GFR>60  04/21/2020 Creatinine1.19, BUN14, Potassium4.2, Sodium140  04/10/2020 Creatinine1.13, BUN15, Potassium3.9, Sodium139  01/14/2020 Creatinine1.21, BUN17, Potassium4.4, Sodium142 A complete set of results can be found in Results Review.  Recommendations:Recommendations given at 10/01/2020 OV with Dr Aundra Dubin.  Follow-up plan: ICM clinic phone appointment on3/21/2022. 91 day device clinic remote transmission 12/21/2020.   EP/Cardiology Office Visits:2/17/2022with Dr. Aundra Dubin.   Copy of ICM check sent to Dr.Taylor.  3 month ICM trend: 10/01/2020.    1 Year ICM trend:       Rosalene Billings, RN 10/02/2020 12:36 PM

## 2020-10-06 ENCOUNTER — Telehealth (HOSPITAL_COMMUNITY): Payer: Self-pay | Admitting: *Deleted

## 2020-10-06 ENCOUNTER — Encounter: Payer: Self-pay | Admitting: Internal Medicine

## 2020-10-06 NOTE — Telephone Encounter (Signed)
-----   Message from Larey Dresser, MD sent at 10/01/2020 10:57 PM EST ----- Based on today's GFR, please have her decrease Xarelto to 15 mg daily.

## 2020-10-06 NOTE — Telephone Encounter (Signed)
Left vm requesting return call.  ===View-only below this line=== ----- Message ----- From: Larey Dresser, MD Sent: 10/01/2020  10:57 PM EST To: Scarlette Calico, RN, Harvie Junior, CMA, *  Based on today's GFR, please have her decrease Xarelto to 15 mg daily.

## 2020-10-08 ENCOUNTER — Telehealth (HOSPITAL_COMMUNITY): Payer: Self-pay | Admitting: Surgery

## 2020-10-08 ENCOUNTER — Encounter: Payer: Self-pay | Admitting: Internal Medicine

## 2020-10-08 DIAGNOSIS — D125 Benign neoplasm of sigmoid colon: Secondary | ICD-10-CM | POA: Diagnosis not present

## 2020-10-08 DIAGNOSIS — D120 Benign neoplasm of cecum: Secondary | ICD-10-CM | POA: Diagnosis not present

## 2020-10-08 DIAGNOSIS — Z8719 Personal history of other diseases of the digestive system: Secondary | ICD-10-CM | POA: Diagnosis not present

## 2020-10-08 DIAGNOSIS — R1314 Dysphagia, pharyngoesophageal phase: Secondary | ICD-10-CM | POA: Diagnosis not present

## 2020-10-08 DIAGNOSIS — K209 Esophagitis, unspecified without bleeding: Secondary | ICD-10-CM | POA: Diagnosis not present

## 2020-10-08 DIAGNOSIS — K449 Diaphragmatic hernia without obstruction or gangrene: Secondary | ICD-10-CM | POA: Diagnosis not present

## 2020-10-08 DIAGNOSIS — D127 Benign neoplasm of rectosigmoid junction: Secondary | ICD-10-CM | POA: Diagnosis not present

## 2020-10-08 DIAGNOSIS — Z1211 Encounter for screening for malignant neoplasm of colon: Secondary | ICD-10-CM | POA: Diagnosis not present

## 2020-10-08 DIAGNOSIS — Z86010 Personal history of colonic polyps: Secondary | ICD-10-CM | POA: Diagnosis not present

## 2020-10-08 DIAGNOSIS — R1310 Dysphagia, unspecified: Secondary | ICD-10-CM | POA: Diagnosis not present

## 2020-10-08 DIAGNOSIS — D509 Iron deficiency anemia, unspecified: Secondary | ICD-10-CM | POA: Diagnosis not present

## 2020-10-08 DIAGNOSIS — K2950 Unspecified chronic gastritis without bleeding: Secondary | ICD-10-CM | POA: Diagnosis not present

## 2020-10-08 DIAGNOSIS — K2270 Barrett's esophagus without dysplasia: Secondary | ICD-10-CM | POA: Diagnosis not present

## 2020-10-08 DIAGNOSIS — K640 First degree hemorrhoids: Secondary | ICD-10-CM | POA: Diagnosis not present

## 2020-10-08 DIAGNOSIS — E1165 Type 2 diabetes mellitus with hyperglycemia: Secondary | ICD-10-CM | POA: Diagnosis not present

## 2020-10-08 DIAGNOSIS — K2100 Gastro-esophageal reflux disease with esophagitis, without bleeding: Secondary | ICD-10-CM | POA: Diagnosis not present

## 2020-10-08 DIAGNOSIS — K5730 Diverticulosis of large intestine without perforation or abscess without bleeding: Secondary | ICD-10-CM | POA: Diagnosis not present

## 2020-10-08 DIAGNOSIS — D122 Benign neoplasm of ascending colon: Secondary | ICD-10-CM | POA: Diagnosis not present

## 2020-10-08 DIAGNOSIS — K6389 Other specified diseases of intestine: Secondary | ICD-10-CM | POA: Diagnosis not present

## 2020-10-08 LAB — HM COLONOSCOPY

## 2020-10-08 NOTE — Telephone Encounter (Signed)
I called patient and left a message for return call.  She should be in process of completing the home sleep study as ordered.  This call was to serve as a reminder and inquire about any difficulties with completing the test.

## 2020-10-09 ENCOUNTER — Telehealth (HOSPITAL_COMMUNITY): Payer: Self-pay

## 2020-10-09 DIAGNOSIS — I5022 Chronic systolic (congestive) heart failure: Secondary | ICD-10-CM

## 2020-10-09 NOTE — Telephone Encounter (Signed)
-----   Message from Larey Dresser, MD sent at 10/01/2020  2:43 PM EST ----- Torsemide decreased today to 40 mg once daily. Would have her hold torsemide completely for 1 day then take 40 mg daily after that.  Also stop KCl. BMET in 1 week to make sure creatinine trends down.

## 2020-10-09 NOTE — Telephone Encounter (Signed)
Patient advised and verbalized understanding. Med list updated to reflect changes,lab appt scheduled,lab order entered.   Orders Placed This Encounter  Procedures  . Basic Metabolic Panel (BMET)    Standing Status:   Future    Standing Expiration Date:   10/09/2021    Order Specific Question:   Release to patient    Answer:   Immediate

## 2020-10-14 ENCOUNTER — Telehealth (HOSPITAL_COMMUNITY): Payer: Self-pay | Admitting: Surgery

## 2020-10-14 NOTE — Telephone Encounter (Signed)
I called and left a message for a return call concerning completion of the home sleep study.

## 2020-10-19 ENCOUNTER — Telehealth: Payer: Self-pay

## 2020-10-19 NOTE — Telephone Encounter (Signed)
PA initiated via Covermymeds; KEY: [id number]. Awaiting determination.

## 2020-10-19 NOTE — Telephone Encounter (Signed)
PA approved until 08/14/21.

## 2020-10-20 ENCOUNTER — Ambulatory Visit (HOSPITAL_COMMUNITY)
Admission: RE | Admit: 2020-10-20 | Discharge: 2020-10-20 | Disposition: A | Discharge status: Home / Self Care | Payer: Medicare Other | Source: Ambulatory Visit | Attending: Cardiology | Admitting: Cardiology

## 2020-10-20 ENCOUNTER — Other Ambulatory Visit: Payer: Self-pay

## 2020-10-20 DIAGNOSIS — I5022 Chronic systolic (congestive) heart failure: Secondary | ICD-10-CM | POA: Diagnosis not present

## 2020-10-20 LAB — BASIC METABOLIC PANEL
Anion gap: 10 (ref 5–15)
BUN: 58 mg/dL — ABNORMAL HIGH (ref 8–23)
CO2: 24 mmol/L (ref 22–32)
Calcium: 10.2 mg/dL (ref 8.9–10.3)
Chloride: 102 mmol/L (ref 98–111)
Creatinine, Ser: 1.76 mg/dL — ABNORMAL HIGH (ref 0.44–1.00)
GFR, Estimated: 32 mL/min — ABNORMAL LOW (ref >60)
Glucose, Bld: 129 mg/dL — ABNORMAL HIGH (ref 70–99)
Potassium: 4.3 mmol/L (ref 3.5–5.1)
Sodium: 136 mmol/L (ref 135–145)

## 2020-10-21 ENCOUNTER — Other Ambulatory Visit: Payer: Self-pay

## 2020-10-21 ENCOUNTER — Encounter: Payer: Self-pay | Admitting: Internal Medicine

## 2020-10-21 ENCOUNTER — Ambulatory Visit (INDEPENDENT_AMBULATORY_CARE_PROVIDER_SITE_OTHER): Payer: Medicare Other | Admitting: Internal Medicine

## 2020-10-21 VITALS — BP 126/80 | HR 64 | Temp 98.5°F | Resp 16 | Ht 65.0 in | Wt 215.4 lb

## 2020-10-21 DIAGNOSIS — Z0000 Encounter for general adult medical examination without abnormal findings: Secondary | ICD-10-CM | POA: Diagnosis not present

## 2020-10-21 DIAGNOSIS — E1159 Type 2 diabetes mellitus with other circulatory complications: Secondary | ICD-10-CM | POA: Diagnosis not present

## 2020-10-21 DIAGNOSIS — D649 Anemia, unspecified: Secondary | ICD-10-CM | POA: Diagnosis not present

## 2020-10-21 LAB — CBC WITH DIFFERENTIAL/PLATELET
Basophils Absolute: 0.1 10*3/uL (ref 0.0–0.1)
Basophils Relative: 0.8 % (ref 0.0–3.0)
Eosinophils Absolute: 0.1 10*3/uL (ref 0.0–0.7)
Eosinophils Relative: 1.8 % (ref 0.0–5.0)
HCT: 33.8 % — ABNORMAL LOW (ref 36.0–46.0)
Hemoglobin: 11.4 g/dL — ABNORMAL LOW (ref 12.0–15.0)
Lymphocytes Relative: 29.4 % (ref 12.0–46.0)
Lymphs Abs: 1.9 10*3/uL (ref 0.7–4.0)
MCHC: 33.7 g/dL (ref 30.0–36.0)
MCV: 92.9 fl (ref 78.0–100.0)
Monocytes Absolute: 0.4 10*3/uL (ref 0.1–1.0)
Monocytes Relative: 6.4 % (ref 3.0–12.0)
Neutro Abs: 4 10*3/uL (ref 1.4–7.7)
Neutrophils Relative %: 61.6 % (ref 43.0–77.0)
Platelets: 265 10*3/uL (ref 150.0–400.0)
RBC: 3.64 Mil/uL — ABNORMAL LOW (ref 3.87–5.11)
RDW: 15.3 % (ref 11.5–15.5)
WBC: 6.5 10*3/uL (ref 4.0–10.5)

## 2020-10-21 LAB — IRON: Iron: 121 ug/dL (ref 42–145)

## 2020-10-21 LAB — IBC + FERRITIN
Ferritin: 29.2 ng/mL (ref 10.0–291.0)
Iron: 121 ug/dL (ref 42–145)
Saturation Ratios: 30 % (ref 20.0–50.0)
Transferrin: 288 mg/dL (ref 212.0–360.0)

## 2020-10-21 LAB — HEMOGLOBIN A1C: Hgb A1c MFr Bld: 6.4 % (ref 4.6–6.5)

## 2020-10-21 NOTE — Progress Notes (Signed)
Subjective:    Patient ID: Melinda Mckinney, female    DOB: [birth year], 66 y.o.   MRN: [id number]  DOS:  10/21/2020 Type of visit - description: CPX  Since the last office visit states that she feels great except for dizziness: For the last 2 months, she get dizzy for a few seconds whenever she gets up or if she moves her head. No associated headache, chest pain, palpitation, slurred speech or facial numbness.   Her right leg is weak (not a new issue).  Wt Readings from Last 3 Encounters:  10/21/20 215 lb 6 oz (97.7 kg)  10/01/20 212 lb 6.4 oz (96.3 kg)  08/25/20 212 lb (96.2 kg)     Review of Systems See above   Past Medical History:  Diagnosis Date  . Anemia   . Anxiety and depression   . Asthma   . Borderline diabetes   . CAD (coronary artery disease) ~2009   stent   . Cervical cancer ([place])    "stage 1; had cryotherapy"  . CHF (congestive heart failure) ([place])   . Chronic lower back pain   . Complete heart block ([place]) 06/2019   s/p AV nodal ablation and CRT-P by Dr Lovena Le  . DDD (degenerative disc disease), lumbar    Dr. Nelva Bush, recommends lumbar epidural steroid injections L5-S1 to the right  . Depression   . Fibromyalgia   . GERD (gastroesophageal reflux disease)   . High cholesterol   . History of hiatal hernia   . History of kidney stones   . Hypertension   . Hypothyroidism   . Osteoporosis   . Permanent atrial fibrillation ([place]) 07/2017   s/p AV nodal ablation  . Thyroid disease     Past Surgical History:  Procedure Laterality Date  . AV NODE ABLATION N/A 06/24/2019   Procedure: AV NODE ABLATION;  Surgeon: Evans Lance, MD;  Location: [place] [facility];  Service: Cardiovascular;  Laterality: N/A;  . BIV PACEMAKER INSERTION CRT-P N/A 06/24/2019   Procedure: BIV PACEMAKER INSERTION CRT-P;  Surgeon: Evans Lance, MD;  Location: [place] [facility];  Service: Cardiovascular;  Laterality: N/A;  . CARDIAC CATHETERIZATION  ~ 2008  . CARDIOVERSION N/A  08/05/2017   Procedure: CARDIOVERSION;  Surgeon: Evans Lance, MD;  Location: [facility];  Service: Cardiovascular;  Laterality: N/A;  . CARDIOVERSION N/A 08/24/2017   Procedure: CARDIOVERSION;  Surgeon: Sanda Klein, MD;  Location: [facility];  Service: Cardiovascular;  Laterality: N/A;  . CARDIOVERSION N/A 06/19/2019   Procedure: CARDIOVERSION;  Surgeon: Larey Dresser, MD;  Location: [facility];  Service: Cardiovascular;  Laterality: N/A;  . CARDIOVERSION N/A 06/21/2019   Procedure: CARDIOVERSION;  Surgeon: Larey Dresser, MD;  Location: [facility];  Service: Cardiovascular;  Laterality: N/A;  . [place]; ~ 1978/1979; 1984  . ESOPHAGOMYOTOMY  2009  . GYNECOLOGIC CRYOSURGERY  X 2   "stage 1 cancer"  . HERNIA REPAIR  X 6   "all in my stomach" (07/27/2017)  . KNEE ARTHROSCOPY Right   . NEPHRECTOMY Right 1991   in [place], due to fibrosis and lithiasis  . RIGHT/LEFT HEART CATH AND CORONARY ANGIOGRAPHY N/A 06/17/2019   Procedure: RIGHT/LEFT HEART CATH AND CORONARY ANGIOGRAPHY;  Surgeon: Lorretta Harp, MD;  Location: [place] [facility];  Service: Cardiovascular;  Laterality: N/A;  . TEE WITHOUT CARDIOVERSION N/A 06/19/2019   Procedure: TRANSESOPHAGEAL ECHOCARDIOGRAM (TEE);  Surgeon: Larey Dresser, MD;  Location: [place];  Service: Cardiovascular;  Laterality: N/A;  . TUBAL LIGATION      Allergies as of 10/21/2020      Reactions   Penicillins Swelling, Rash   Has patient had a PCN reaction causing immediate rash, facial/tongue/throat swelling, SOB or lightheadedness with hypotension: No Has patient had a PCN reaction causing severe rash involving mucus membranes or skin necrosis: No Has patient had a PCN reaction that required hospitalization: No Has patient had a PCN reaction occurring within the last 10 years: No If all of the above answers are "NO", then may proceed with Cephalosporin use.      Medication List       Accurate as of October 21, 2020 11:59 PM. If you  have any questions, ask your nurse or doctor.        alendronate 70 MG tablet Commonly known as: FOSAMAX Take 1 tablet (70 mg total) by mouth every 7 (seven) days. Take with a full glass of water on an empty stomach.   allopurinol 100 MG tablet Commonly known as: ZYLOPRIM Take 1 tablet (100 mg total) by mouth daily.   atorvastatin 40 MG tablet Commonly known as: LIPITOR Take 1.5 tablet by mouth daily at bedtime   carvedilol 6.25 MG tablet Commonly known as: COREG TAKE ONE TABLET BY MOUTH TWICE A DAY WITH MEALS   clobetasol cream 0.05 % Commonly known as: TEMOVATE   cyclobenzaprine 10 MG tablet Commonly known as: FLEXERIL Take 1 tablet (10 mg total) by mouth 2 (two) times daily as needed for muscle spasms.   DULoxetine 60 MG capsule Commonly known as: CYMBALTA Take 1 capsule (60 mg total) by mouth daily.   Entresto 97-103 MG Generic drug: sacubitril-valsartan TAKE ONE TABLET BY MOUTH TWICE A DAY   Farxiga 10 MG Tabs tablet Generic drug: dapagliflozin propanediol TAKE ONE TABLET BY MOUTH DAILY BEFORE BREAKFAST   ferrous fumarate 325 (106 Fe) MG Tabs tablet Commonly known as: HEMOCYTE - 106 mg FE Take 1 tablet (106 mg of iron total) by mouth 2 (two) times daily. Do not take at the same time as omeprazole (Prilosec)   ketoconazole 2 % cream Commonly known as: NIZORAL Apply 1 application topically daily.   levothyroxine 125 MCG tablet Commonly known as: SYNTHROID Take 1 tablet (125 mcg total) by mouth daily before breakfast.   metFORMIN 500 MG tablet Commonly known as: GLUCOPHAGE Take 1 tablet (500 mg total) by mouth 2 (two) times daily with a meal.   omeprazole 20 MG capsule Commonly known as: PRILOSEC Take 1 capsule (20 mg total) by mouth 2 (two) times daily before a meal.   rivaroxaban 20 MG Tabs tablet Commonly known as: Xarelto Take 1 tablet (20 mg total) by mouth daily with supper.   spironolactone 25 MG tablet Commonly known as: ALDACTONE TAKE ONE  TABLET BY MOUTH DAILY   torsemide 20 MG tablet Commonly known as: DEMADEX Take 2 tablets (40 mg total) by mouth daily.   Vitamin D3 125 MCG (5000 UT) Caps Take 5,000 Units by mouth daily.          Objective:   Physical Exam BP 126/80 (BP Location: Left Arm, Patient Position: Sitting, Cuff Size: Normal)   Pulse 64   Temp 98.5 F (36.9 C) (Oral)   Resp 16   Ht 5\' 5"  (1.651 m)   Wt 215 lb 6 oz (97.7 kg)   BMI 35.84 kg/m  General: Well developed, NAD, BMI noted Neck: No  thyromegaly, normal carotid pulses HEENT:  Normocephalic . Face symmetric,  atraumatic Lungs:  CTA B Normal respiratory effort, no intercostal retractions, no accessory muscle use. Heart: RRR,  no murmur.  Abdomen:  Not distended, soft, non-tender. No rebound or rigidity.   Lower extremities: no pretibial edema bilaterally  Skin: Exposed areas without rash. Not pale. Not jaundice Neurologic:  alert & oriented X3.  EOMI. Speech normal, gait appropriate for age and unassisted Strength: Slightly decreased at the right lower extremity (not a new issue) otherwise symmetric. Psych: Cognition and judgment appear intact.  Cooperative with normal attention span and concentration.  Behavior appropriate. No anxious or depressed appearing.     Assessment     Assessment   DM (A1c 6.7 03-2020) HTN High cholesterol Hypothyroidism Anxiety depression, insomnia CV: --CAD: stent ~ 2009 --Atrial fibrillation DX 06-2017  --06-2019 A. fib, RVR, failed cardioversion,, cath: Normal coronaries, had AV nodal ablation, ICD implanted  --Non ischemic cardiomyopathy, DX 06-2019, tachycardia related? Obesity  Vitamin D deficiency, saw Dr. Cruzita Lederer 07-2016, rtc 1 year, celiac dz test (-), unlikely  Malabsortion; cont supplements  GERD H/o achalasia, s/p [place] 2009 SINGLE KIDNEY---S/p R  Nephrectomy ([place] 1991 due to stones/fibrosis) MSK: --Fibromyalgia --DJD  Knee pain R --Back pain -- s/p epidural  10-16 Osteoporosis: Per Endo as of 10-2020 08-2014 T score -2.8, 09-2015: T score -2.7.- GYN: h/o cervical dysplasia Derm: vulvar pruritus, temovate prn ok per gyn  PLAN: Here for CPX Dizziness: Symptoms consistent with peripheral dizziness, rec observation, good hydration. DM: On Metformin, check A1c.  Doing well with diet. HTN: On multiple medicines, BP today is very good. High cholesterol: On Lipitor, last FLP satisfactory Cardiovascular: Saw cardiology 10/01/2020, echo was recommended, torsemide dose  decreased  Osteoporosis Saw endocrinology 08/25/2020, they noted she took Evista for 4 years and now she is on Fosamax. A DEXA was ordered.  Next visit 1 year Anemia, achalasia, colon polyps: Saw GI 07/17/2020 for follow-up on iron deficiency anemia, dysphagia,   Barrett's esophagus, was recommended a EGD and colonoscopy done 10/08/2020.  Check a CBC anemia panel. RTC 4 to 5 months    This visit occurred during the SARS-CoV-2 public health emergency.  Safety protocols were in place, including screening questions prior to the visit, additional usage of staff PPE, and extensive cleaning of exam room while observing appropriate contact time as indicated for disinfecting solutions.

## 2020-10-21 NOTE — Patient Instructions (Addendum)
GO TO THE LAB : Get the blood work     [place], [place] back for a checkup in 4 to 5 months    Voluntades anticipadas Advance Directive  Las voluntades anticipadas son documentos legales que le permiten tomar decisiones acerca de su tratamiento mdico y atencin de la salud en caso de no poder expresarse usted mismo. Las voluntades anticipadas comunican sus deseos a familiares, amigos y mdicos. La elaboracin y la redaccin de las voluntades anticipadas deben realizarse con el transcurso del [place], en lugar de que suceda todo a la misma vez. Las voluntades anticipadas se pueden cambiar y Architectural technologist. Hay diferentes tipos de voluntades anticipadas, por ejemplo:  Poder notarial mdico.  Testamento vital.  Orden de no reanimar (ONR) o de no intentar la reanimacin (ONIR). Apoderado para asuntos de salud y poder notarial mdico Al apoderado para asuntos de salud tambin se le llama representante para asuntos de Passenger transport manager. Esta persona es designada para tomar decisiones mdicas en representacin suya cuando usted no pueda tomar decisiones por usted mismo. Por lo general, las Engineer, agricultural piden a un amigo o familiar de confianza que acte como su apoderado y represente sus preferencias. Asegrese de tener un acuerdo con su persona de confianza para que acte como su apoderado. Es posible que un apoderado deba tomar una decisin mdica en su nombre si no se conocen sus deseos. Un poder notarial mdico, tambin llamado poder notarial duradero para asuntos de [place], es un documento legal que nombra a su apoderado para asuntos de Passenger transport manager. En funcin de las leyes de su estado, es posible que el documento tambin deba estar:  [place].  Autenticado ante escribano.  Fechado.  Copiado.  Atestiguado.  Incorporado a la historia clnica del paciente. Tambin es posible que desee designar a una persona de confianza para administrar su dinero  en caso de que usted no pueda hacerlo. Esto se denomina poder notarial duradero para asuntos financieros. Este es un documento legal diferente del poder notarial duradero para asuntos de salud. Usted puede elegir a su apoderado para asuntos de salud o a una persona diferente para que acte como su apoderado en los asuntos de dinero. Si no asigna un apoderado, o si se considera que el apoderado no est actuando para su bien, un tribunal podr Designer, industrial/product legal para que acte en representacin suya. El testamento vital El testamento vital es un conjunto de instrucciones en las que se establecen sus deseos acerca de la atencin mdica para el momento en que no pueda expresarlos usted mismo. Los mdicos deben conservar una copia del testamento vital en su historia clnica. Le recomendamos que entregue una copia a familiares o amigos. Con el fin de alertar a sus cuidadores en caso de Engineer, maintenance (IT), puede colocar una tarjeta en la billetera que indique que tiene un testamento vital y dnde Film/video editor. El testamento vital se [place] si:  Tiene una enfermedad terminal.  Queda discapacitado.  No puede comunicarse ni tomar decisiones. Debe incluir las siguientes decisiones en su testamento vital:  El uso o no uso de equipos de soporte vital, como dispositivos de dilisis y Systems developer respiratorias (respiradores).  Si desea una ONR o [place]. Esto les indica a los mdicos que no usen Actor (RCP) si se le detiene la respiracin o los latidos cardacos.  El [place] o no uso de alimentacin por sonda.  La administracin o no administracin de alimentos y lquidos.  Si  desea recibir atencin de alivio (cuidados paliativos) cuando el objetivo sea la comodidad ms que la [place].  Si desea donar sus rganos y tejidos. El testamento vital no da instrucciones acerca de la distribucin del dinero ni las propiedades en caso de fallecimiento. ONR u ONIR La ONR es el pedido de no Banker en  el caso de que el corazn o la respiracin se Production designer, theatre/television/film. Si no se realiz ni se present [employer] u ONIR, el mdico intentar ayudar a cualquier paciente cuyo corazn o respiracin se haya detenido. Si planea someterse a [place], hable con su mdico sobre el cumplimiento de su ONR u ONIR en caso de que surjan problemas. Qu sucede si no tengo una voluntad anticipada? Algunos estados asignan familiares a cargo de la toma de decisiones para que acten en representacin suya si no tiene una voluntad anticipada. Cada estado tiene sus propias leyes sobre las voluntades anticipadas. Es posible que desee consultar a su mdico, su abogado o al representante estatal acerca de las leyes de su [place]. Resumen  Las voluntades anticipadas son documentos legales que le permiten tomar decisiones acerca de su tratamiento mdico y atencin de la salud en caso de no poder expresarse usted mismo.  El proceso de Paediatric nurse y Building surveyor las voluntades anticipadas debe realizarse con el transcurso del [place]. Usted puede cambiar y Charity fundraiser las voluntades anticipadas en cualquier momento.  Las voluntades anticipadas pueden incluir un poder mdico, un testamento vital y Ardelia Mems orden ONR u [place]. Esta informacin no tiene Marine scientist el consejo del mdico. Asegrese de hacerle al mdico cualquier pregunta que tenga. Document Revised: 05/28/2020 Document Reviewed: 05/28/2020 Elsevier Patient Education  2021 [employer].

## 2020-10-22 DIAGNOSIS — M810 Age-related osteoporosis without current pathological fracture: Secondary | ICD-10-CM | POA: Diagnosis not present

## 2020-10-22 DIAGNOSIS — Z780 Asymptomatic menopausal state: Secondary | ICD-10-CM | POA: Diagnosis not present

## 2020-10-22 NOTE — Assessment & Plan Note (Signed)
--  Td: 2015 - pnm shot 2015; prevnar 12-28-15  -zostavax 12-28-2015 - shingrex : Discussed before -COVID vaccines x3 -Had a flu shot --Female care: sees Dr Bethann Goo in [place]. [place] 09-2020 [facility]) -- CCS: 11/21/2013: Cscope;  repeated cscope ~ 11-2015: repeat 2 years, had a colonoscopy 2019.  Next per GI --Available labs reviewed, will check CBC, anemia profile, A1c --lifestyle: discussed   --Fall screening: Negative, no recent falls --Advance directives discussed

## 2020-10-22 NOTE — Assessment & Plan Note (Signed)
Here for CPX Dizziness: Symptoms consistent with peripheral dizziness, rec observation, good hydration. DM: On Metformin, check A1c.  Doing well with diet. HTN: On multiple medicines, BP today is very good. High cholesterol: On Lipitor, last FLP satisfactory Cardiovascular: Saw cardiology 10/01/2020, echo was recommended, torsemide dose  decreased  Osteoporosis Saw endocrinology 08/25/2020, they noted she took Evista for 4 years and now she is on Fosamax. A DEXA was ordered.  Next visit 1 year Anemia, achalasia, colon polyps: Saw GI 07/17/2020 for follow-up on iron deficiency anemia, dysphagia,   Barrett's esophagus, was recommended a EGD and colonoscopy done 10/08/2020.  Check a CBC anemia panel. RTC 4 to 5 months

## 2020-10-30 NOTE — Progress Notes (Signed)
lmtrc

## 2020-11-02 ENCOUNTER — Ambulatory Visit (INDEPENDENT_AMBULATORY_CARE_PROVIDER_SITE_OTHER): Payer: Medicare Other

## 2020-11-02 DIAGNOSIS — Z950 Presence of cardiac pacemaker: Secondary | ICD-10-CM

## 2020-11-02 DIAGNOSIS — D509 Iron deficiency anemia, unspecified: Secondary | ICD-10-CM | POA: Diagnosis not present

## 2020-11-02 DIAGNOSIS — Z86010 Personal history of colonic polyps: Secondary | ICD-10-CM | POA: Diagnosis not present

## 2020-11-02 DIAGNOSIS — I5022 Chronic systolic (congestive) heart failure: Secondary | ICD-10-CM

## 2020-11-02 DIAGNOSIS — K220 Achalasia of cardia: Secondary | ICD-10-CM | POA: Diagnosis not present

## 2020-11-02 DIAGNOSIS — K2270 Barrett's esophagus without dysplasia: Secondary | ICD-10-CM | POA: Diagnosis not present

## 2020-11-02 DIAGNOSIS — K219 Gastro-esophageal reflux disease without esophagitis: Secondary | ICD-10-CM | POA: Diagnosis not present

## 2020-11-03 NOTE — Progress Notes (Signed)
EPIC Encounter for ICM Monitoring  Patient Name: Melinda Mckinney is a 65 y.o. female Date: 11/03/2020 Primary Care Physican: Colon Branch, MD Primary Cardiologist:[place] Electrophysiologist:Taylor Bi-V Pacing:99% 10/21/2020 Office Weight:215 lbs   Time in AT/AF24.0 hr/day (100.0%)  Transmission reviewed.  Optivol thoracic impedancesuggestingnormal fluid levels.  Prescribed:  Torsemide20 mg take2tablets (40 mg total)daily.  Potassium 20 mEq take 1 tablet daily.  Spironolactone 25 mg take 1 tablet daily  Labs: 10/20/2020 Creatinine 1.76, BUN 58, Potassium 4.3, Sodium 136, GFR 32 10/01/2020 Creatinine 1.90, BUN 54, Potassium 5.0, Sodium 138, GFR 29  08/25/2020 Creatinine 1.65, BUN 52, Potassium 4.0, Sodium 140  A complete set of results can be found in Results Review.  Recommendations: No changes  Follow-up plan: ICM clinic phone appointment on4/25/2022. 91 day device clinic remote transmission5/04/2021.   EP/Cardiology Office Visits:3/31/2022with Dr. Aundra Dubin.   Copy of ICM check sent to Dr.Taylor.  3 month ICM trend: 11/02/2020.    1 Year ICM trend:       Rosalene Billings, RN 11/03/2020 1:00 PM

## 2020-11-11 DIAGNOSIS — L900 Lichen sclerosus et atrophicus: Secondary | ICD-10-CM | POA: Diagnosis not present

## 2020-11-12 ENCOUNTER — Ambulatory Visit (HOSPITAL_BASED_OUTPATIENT_CLINIC_OR_DEPARTMENT_OTHER)
Admission: RE | Admit: 2020-11-12 | Discharge: 2020-11-12 | Disposition: A | Discharge status: Home / Self Care | Payer: Medicare Other | Source: Ambulatory Visit

## 2020-11-12 ENCOUNTER — Encounter (HOSPITAL_COMMUNITY): Payer: Self-pay

## 2020-11-12 ENCOUNTER — Other Ambulatory Visit: Payer: Self-pay

## 2020-11-12 ENCOUNTER — Ambulatory Visit (HOSPITAL_COMMUNITY)
Admission: RE | Admit: 2020-11-12 | Discharge: 2020-11-12 | Disposition: A | Discharge status: Home / Self Care | Payer: Medicare Other | Source: Ambulatory Visit | Attending: Cardiology | Admitting: Cardiology

## 2020-11-12 VITALS — BP 108/74 | HR 97 | Wt 213.6 lb

## 2020-11-12 DIAGNOSIS — Z7901 Long term (current) use of anticoagulants: Secondary | ICD-10-CM | POA: Insufficient documentation

## 2020-11-12 DIAGNOSIS — N1832 Chronic kidney disease, stage 3b: Secondary | ICD-10-CM | POA: Insufficient documentation

## 2020-11-12 DIAGNOSIS — I5022 Chronic systolic (congestive) heart failure: Secondary | ICD-10-CM

## 2020-11-12 DIAGNOSIS — Z955 Presence of coronary angioplasty implant and graft: Secondary | ICD-10-CM | POA: Insufficient documentation

## 2020-11-12 DIAGNOSIS — Z8249 Family history of ischemic heart disease and other diseases of the circulatory system: Secondary | ICD-10-CM | POA: Diagnosis not present

## 2020-11-12 DIAGNOSIS — I080 Rheumatic disorders of both mitral and aortic valves: Secondary | ICD-10-CM | POA: Diagnosis not present

## 2020-11-12 DIAGNOSIS — I130 Hypertensive heart and chronic kidney disease with heart failure and stage 1 through stage 4 chronic kidney disease, or unspecified chronic kidney disease: Secondary | ICD-10-CM | POA: Insufficient documentation

## 2020-11-12 DIAGNOSIS — I4819 Other persistent atrial fibrillation: Secondary | ICD-10-CM | POA: Insufficient documentation

## 2020-11-12 DIAGNOSIS — Z79899 Other long term (current) drug therapy: Secondary | ICD-10-CM | POA: Diagnosis not present

## 2020-11-12 DIAGNOSIS — Z87891 Personal history of nicotine dependence: Secondary | ICD-10-CM | POA: Diagnosis not present

## 2020-11-12 DIAGNOSIS — I5042 Chronic combined systolic (congestive) and diastolic (congestive) heart failure: Secondary | ICD-10-CM | POA: Insufficient documentation

## 2020-11-12 DIAGNOSIS — I2510 Atherosclerotic heart disease of native coronary artery without angina pectoris: Secondary | ICD-10-CM | POA: Diagnosis not present

## 2020-11-12 DIAGNOSIS — I428 Other cardiomyopathies: Secondary | ICD-10-CM | POA: Insufficient documentation

## 2020-11-12 DIAGNOSIS — E039 Hypothyroidism, unspecified: Secondary | ICD-10-CM | POA: Diagnosis not present

## 2020-11-12 DIAGNOSIS — Z8616 Personal history of COVID-19: Secondary | ICD-10-CM | POA: Insufficient documentation

## 2020-11-12 DIAGNOSIS — Z7984 Long term (current) use of oral hypoglycemic drugs: Secondary | ICD-10-CM | POA: Diagnosis not present

## 2020-11-12 LAB — BASIC METABOLIC PANEL
Anion gap: 11 (ref 5–15)
BUN: 40 mg/dL — ABNORMAL HIGH (ref 8–23)
CO2: 25 mmol/L (ref 22–32)
Calcium: 9.5 mg/dL (ref 8.9–10.3)
Chloride: 97 mmol/L — ABNORMAL LOW (ref 98–111)
Creatinine, Ser: 1.75 mg/dL — ABNORMAL HIGH (ref 0.44–1.00)
GFR, Estimated: 32 mL/min — ABNORMAL LOW (ref >60)
Glucose, Bld: 113 mg/dL — ABNORMAL HIGH (ref 70–99)
Potassium: 4.9 mmol/L (ref 3.5–5.1)
Sodium: 133 mmol/L — ABNORMAL LOW (ref 135–145)

## 2020-11-12 LAB — ECHOCARDIOGRAM COMPLETE
AR max vel: 1.4 cm2
AV Area VTI: 1.34 cm2
AV Area mean vel: 1.25 cm2
AV Mean grad: 8 mmHg
AV Peak grad: 14 mmHg
Ao pk vel: 1.87 m/s
Calc EF: 49.8 %
P 1/2 time: 782 msec
S' Lateral: 3.4 cm
Single Plane A2C EF: 45 %
Single Plane A4C EF: 55 %

## 2020-11-12 MED ORDER — RIVAROXABAN 15 MG PO TABS: 15.0000 mg | ORAL_TABLET | Freq: Every day | ORAL | 3 refills | Status: DC

## 2020-11-12 NOTE — Progress Notes (Signed)
  Echocardiogram 2D Echocardiogram has been performed.  Melinda Mckinney F 11/12/2020, 8:45 AM

## 2020-11-12 NOTE — Patient Instructions (Addendum)
CHANGE Xarelto to 15 mg, one tab daily   Labs today We will only contact you if something comes back abnormal or we need to make some changes. Otherwise no news is good news!   Your physician recommends that you schedule a follow-up appointment in: 3 months with Dr Aundra Dubin  Do the following things EVERYDAY: 1) Weigh yourself in the morning before breakfast. Write it down and keep it in a log. 2) Take your medicines as prescribed 3) Eat low salt foods--Limit salt (sodium) to 2000 mg per day.  4) Stay as active as you can everyday 5) Limit all fluids for the day to less than 2 liters If you have any questions or concerns before your next appointment please send [place] a message through [place] or call our office at [phone].    TO LEAVE A MESSAGE FOR THE NURSE SELECT OPTION 2, PLEASE LEAVE A MESSAGE INCLUDING: . YOUR NAME . DATE OF BIRTH . CALL BACK NUMBER . REASON FOR CALL**this is important as we prioritize the call backs  YOU WILL RECEIVE A CALL BACK THE SAME DAY AS LONG AS YOU CALL BEFORE 4:00 PM

## 2020-11-12 NOTE — Progress Notes (Signed)
PCP: Colon Branch, MD  HF Cardiology: Dr. Aundra Dubin  Reason for Visit: F/u for chronic systolic heart failure   65 y.o. with CAD s/p PCI in 2010, chronic combined systolic and diastolic HF (EF 17-61% in 2018), atrial fibrillation on chronic anticoagulation w/ Xarelto, HTN, and hypothyroidism was diagnosed w/ COVID-19 in Sep 2020 and treated at [facility] c/b PNA. She was discharged and readmitted back to [facility] 10/9-10/13/20 for gastroenteritis, felt to be a sequela of COVID-19. She was managed w/ supportive care and discharged home. Had f/u with PCP 10/22 and endorsed worsening LLQ pain leading to abdominal CT which showed acute diverticulitis. She was started on outpatient antibiotics, cipro + flagyl but symptoms failed to improve, prompting her to seek emergency medical care at [facility].  Initial EKG in the ED showed afib w/ RVR for which general cardiology was consulted. She was started on IV dilt for rate control but later required treatment w/ IV amiodarone due to persistent rapid ventricular response. Hospital course c/b volume overload, requiring IV diuretics. 2D echo was obtained and showed worsening LVEF, now 25-30% w/ moderate MR. She underwent subsequent R/LHC in 11/20 which demonstrated widely patent coronaries. RHC showed elevated mean RA pressure at 22, PWP 43, LVEDP 45 and low CO. CI by Fick 1.6. She underwent TEE-guided DCCV, TEE showed EF 15% with moderate RV dysfunction and moderate MR.  She went back into rapid atrial fibrillation.  She failed additional cardioversions.  Despite amiodarone gtt, HR remained in the 140s-150s.  Finally, she underwent AV nodal ablation with BiV pacing due to inability to control atrial fibrillation. While in the hospital, she was on milrinone gtt for a period of time, but we were able to taper it off. She was discharged home after medication optimization.   Echo in 2/21 showed EF up to 40-45%, mild diffuse hypokinesis, RV low  normal function.   Had f/u w/ Dr. Aundra Dubin 2/22 and was doing well. Volume status was stable. Torsemide was reduced down to 40 mg once daily. She was also ordered to get repeat echocardiogram as well as sleep study to r/o OSA.  She returns back today for f/u. Here w/ husband. Echo completed. Interpretation pending. Volume status good on exam and by Optivol. Remains NYHA Class II. BP soft but stable. No orthostatic symptoms.   Labs (11/20): digoxin level 1.4, creatinine 1.81, K 4.8, hgb 13.9 Labs (1/21): LDL 91, K 3.7, creatinine 1.32 Labs (2/21): K 5.1, creatinine 1.37, TSH normal, digoxin 0.4 Labs (5/21): LDL 68, digoxin 0.3 Labs (6/21): K 4.4, creatinine 1.21 Labs (1/22): K 4, creatinine 1.65, TSH normal Labs (2/22): K 4.3, creatinine 1.76  Medtronic device interrogation: >99% BiV pacing, 100% atrial fibrillation, fluid index < threshold. Impedence at reference curve.     PMH: 1. Hypothyroidism 2. Single kidney s/p nephrectomy 3. Atrial fibrillation: Initially diagnosed in 2018.  She was on sotalol at one point to maintain NSR.  - Uncontrollable atrial fibrillation during 11/20 admission, patient eventually had AV nodal ablation with CRT-P implantation.  4. H/o achalasia 5. GERD 6. Fibromyalgia 7. H/o diverticulitis in 10/20 8. COVID-19 in 9/20.  9. H/o cervical cancer: cryotherapy.  10. Degenerative disc disease.  11. Chronic systolic CHF: Nonischemic cardiomyopathy, possibly tachycardia-mediated in setting of uncontrolled atrial fibrillation. Medtronic CRT-P device.  - Echo (2018): EF 45-50%.  - Echo (11/20): EF 25-30%, mildly decreased RV systolic function, moderate MR.  - TEE (11/20): EF 15%, moderately decreased RV systolic  function, moderate MR.  - Cardiac MRI (11/20): EF 34%, RV EF 30%, no LGE (done after AV nodal ablation and BiV pacing).  - RHC (11/20): mean RA 22, mean PCWP 43, LVEDP 45, CI 1.6 (Fick) and 2.7 (thermodilution).  - Echo (2/21): EF 40-45%, mild diffuse  hypokinesis, low normal RV systolic function.  12. CAD: PCI in 2010.  - LHC (11/20): No significant coronary disease.   Social History   Socioeconomic History  . Marital status: Married    Spouse name: Not on file  . Number of children: 3  . Years of education: Not on file  . Highest education level: Not on file  Occupational History  . 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 Smoking status: Former Smoker    Packs/day: 2.00    Years: 28.00    Pack years: 56.00    Types: Cigarettes    Quit date: 2000    Years since quitting: 22.2  . Smokeless tobacco: Never Used  Vaping Use  . Vaping Use: Never used  Substance and Sexual Activity  . Alcohol use: Yes    Alcohol/week: 5.0 standard drinks    Types: 5 Cans of beer per week    Comment: 07/27/2017 "3-4 drinks//month"  . Drug use: No  . Sexual activity: Not Currently    Comment: 1ST INTERCOURSE- 16, PARTNERS - 2  Other Topics Concern  . Not on file  Social History Narrative   Original from [place]   3 children, 2 alive   Household --pt and husband    Social Determinants of Radio broadcast assistant Strain: Not on file  Food Insecurity: Not on file  Transportation Needs: Not on file  Physical Activity: Not on file  Stress: Not on file  Social Connections: Not on file  Intimate Partner Violence: Not on file   Family History  Problem Relation Age of Onset  . Breast cancer Other        aunt   . CAD Brother   . Lung cancer Mother        F and M  . Diabetes Father        F and mother   . CAD Father   . Lung cancer Father   . Stroke Sister   . Colon cancer Neg Hx    ROS: All systems reviewed and negative except as per HPI.   Current Outpatient Medications  Medication Sig Dispense Refill  . alendronate (FOSAMAX) 70 MG tablet Take 1 tablet (70 mg total) by mouth every 7 (seven) days. Take with a full glass of water on an empty stomach. 12 tablet 3  . allopurinol (ZYLOPRIM) 100 MG tablet Take 1 tablet (100 mg  total) by mouth daily. 90 tablet 1  . atorvastatin (LIPITOR) 40 MG tablet Take 1.5 tablet by mouth daily at bedtime 135 tablet 1  . carvedilol (COREG) 6.25 MG tablet TAKE ONE TABLET BY MOUTH TWICE A DAY WITH MEALS 180 tablet 3  . Cholecalciferol (VITAMIN D3) 5000 UNITS CAPS Take 5,000 Units by mouth daily.     . clobetasol cream (TEMOVATE) 0.05 %     . cyclobenzaprine (FLEXERIL) 10 MG tablet Take 1 tablet (10 mg total) by mouth 2 (two) times daily as needed for muscle spasms. 60 tablet 1  . DULoxetine (CYMBALTA) 60 MG capsule Take 1 capsule (60 mg total) by mouth daily. 90 capsule 1  . ENTRESTO 97-103 MG TAKE ONE TABLET BY MOUTH TWICE A DAY 60 tablet  3  . FARXIGA 10 MG TABS tablet TAKE ONE TABLET BY MOUTH DAILY BEFORE BREAKFAST 30 tablet 3  . ferrous fumarate (HEMOCYTE - 106 MG FE) 325 (106 Fe) MG TABS tablet Take 1 tablet (106 mg of iron total) by mouth 2 (two) times daily. Do not take at the same time as omeprazole (Prilosec) 60 tablet 6  . ketoconazole (NIZORAL) 2 % cream Apply 1 application topically daily. 60 g 0  . levothyroxine (SYNTHROID) 125 MCG tablet Take 1 tablet (125 mcg total) by mouth daily before breakfast. 90 tablet 1  . metFORMIN (GLUCOPHAGE) 500 MG tablet Take 1 tablet (500 mg total) by mouth 2 (two) times daily with a meal. 180 tablet 1  . omeprazole (PRILOSEC) 20 MG capsule Take 1 capsule (20 mg total) by mouth 2 (two) times daily before a meal. 180 capsule 3  . spironolactone (ALDACTONE) 25 MG tablet TAKE ONE TABLET BY MOUTH DAILY 90 tablet 3  . torsemide (DEMADEX) 20 MG tablet Take 2 tablets (40 mg total) by mouth daily. 180 tablet 3  . Rivaroxaban (XARELTO) 15 MG TABS tablet Take 1 tablet (15 mg total) by mouth daily with supper. 30 tablet 3   No current facility-administered medications for this encounter.   BP 108/74   Pulse 97   Wt 96.9 kg (213 lb 9.6 oz)   SpO2 98%   BMI 35.54 kg/m  PHYSICAL EXAM: General:  Well appearing, moderately obese. No respiratory  difficulty HEENT: normal Neck: supple. no JVD. Carotids 2+ bilat; no bruits. No lymphadenopathy or thyromegaly appreciated. Cor: PMI nondisplaced. Regular rate & rhythm. No rubs, gallops or murmurs. Lungs: clear Abdomen: soft, nontender, nondistended. No hepatosplenomegaly. No bruits or masses. Good bowel sounds. Extremities: no cyanosis, clubbing, rash, edema Neuro: alert & oriented x 3, cranial nerves grossly intact. moves all 4 extremities w/o difficulty. Affect pleasant.   Assessment/Plan: 1. Chronic systolic CHF: Nonischemic cardiomyopathy. Echo in 2018 with EF 45-50%. Echo in 11/20 with EF 25-30%, diffuse hypokinesis, mildly decreased RV systolic function, moderate MR. LHC/RHC in 11/20 with no significant coronary disease, markedly elevated filling pressures and low cardiac output (CI 1.6). 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 Cardiac MRI 11/20 showed LVEF 34%, RVEF 30%, no LGE => perhaps pointing to tachy-mediated CMP as more likely etiology.  Initially required inotropic support w/ milrinone but able to titrate off.  Now s/p AV nodal ablation with MDT CRT-P.  Echo in 2/21 showed EF up to 40-45%.  Stable NYHA Class II symtoms, euvolemic on exam and by Optivol.   - Continue torsemide 40 mg daily.  - Continue Entresto to 97/103 bid.   - Continue spironolactone to 25 daily.  - Continue dapagliflozin 10 mg daily.  - Continue Coreg 6.25 mg bid. BP too soft for titration  - Repeat echo completed today, interpretation pending.   2. Atrial fibrillation: Persistent atrial fibrillation, it appears, at least since 9/20 when she was admitted with COVID-19. Prior, she had paroxysmal atrial fibrillation and was maintained on sotalol. She is now off sotalol.  Unable to cardiovert or rate control atrial fibrillation. Therefore, she had AV nodal ablation with BiV pacing.  - Now off  amiodarone.   - continue Xarelto 15 mg daily (renal dose)  3. Mitral regurgitation: Functional MR, moderate on TEE.  Rate was controlled on cardiac MRI, MR was less impressive.  Minimal MR on echo in 2/21.  -  echo repeated today, interpretation pending  4. CKD stage 3b:  - check BMP today  5. Suspect OSA: awaiting home sleep study. Has been ordered   F/u w/ Dr. Aundra Dubin in 3 months.     Lyda Jester, PA-C  11/12/2020

## 2020-11-15 ENCOUNTER — Other Ambulatory Visit (HOSPITAL_COMMUNITY): Payer: Self-pay | Admitting: Cardiology

## 2020-11-18 NOTE — Progress Notes (Signed)
Med changes were made at patients last ov due to patient being unreachable by phone

## 2020-12-07 ENCOUNTER — Ambulatory Visit (INDEPENDENT_AMBULATORY_CARE_PROVIDER_SITE_OTHER): Payer: Medicare Other

## 2020-12-07 ENCOUNTER — Telehealth: Payer: Self-pay

## 2020-12-07 DIAGNOSIS — Z950 Presence of cardiac pacemaker: Secondary | ICD-10-CM

## 2020-12-07 DIAGNOSIS — I5022 Chronic systolic (congestive) heart failure: Secondary | ICD-10-CM

## 2020-12-07 NOTE — Progress Notes (Signed)
EPIC Encounter for ICM Monitoring  Patient Name: Melinda Mckinney is a 65 y.o. female Date: 12/07/2020 Primary Care Physican: Colon Branch, MD Primary Cardiologist:McLean Electrophysiologist:Taylor Bi-V Pacing:99.3% 10/21/2020 OfficeWeight:215lbs   Time in AT/AF24.0 hr/day (100.0%)  Attempted call to spouse and unable to reach.  Left message to return call. Transmission reviewed.   Optivol thoracic impedancesuggestingpossible fluid accumulation starting 12/02/2020 and trending back toward baseline.  Prescribed:  Torsemide20 mg take2tablets (40 mg total)daily.  Spironolactone 25 mg take 1 tablet daily  Farxiga 10 mg take 1 tablet daily  Labs: 11/12/2020 Creatinine 1.75, BUN 40, Potassium 4.9, Sodium 133, GFR 32 10/20/2020 Creatinine 1.76, BUN 58, Potassium 4.3, Sodium 136, GFR 32 10/01/2020 Creatinine 1.90, BUN 54, Potassium 5.0, Sodium 138, GFR 29  08/25/2020 Creatinine 1.65, BUN 52, Potassium 4.0, Sodium 140  A complete set of results can be found in Results Review.  Recommendations: Left voice mail with ICM number and encouraged to call if experiencing any fluid symptoms.  Follow-up plan: ICM clinic phone appointment on5/09/2020 to recheck fluid levels. 91 day device clinic remote transmission5/04/2021.   EP/Cardiology Office Visits:7/5/2022with Dr. Aundra Dubin.   Copy of ICM check sent to Dr.Taylor.  3 month ICM trend: 12/07/2020.    1 Year ICM trend:       Rosalene Billings, RN 12/07/2020 1:16 PM

## 2020-12-07 NOTE — Telephone Encounter (Signed)
Remote ICM transmission received.  Attempted call to spouse regarding ICM remote transmission and left message to return call.

## 2020-12-14 ENCOUNTER — Ambulatory Visit (INDEPENDENT_AMBULATORY_CARE_PROVIDER_SITE_OTHER): Payer: Medicare Other

## 2020-12-14 DIAGNOSIS — I5022 Chronic systolic (congestive) heart failure: Secondary | ICD-10-CM

## 2020-12-14 DIAGNOSIS — Z950 Presence of cardiac pacemaker: Secondary | ICD-10-CM

## 2020-12-15 ENCOUNTER — Other Ambulatory Visit: Payer: Self-pay | Admitting: Internal Medicine

## 2020-12-15 ENCOUNTER — Telehealth: Payer: Self-pay

## 2020-12-15 NOTE — Progress Notes (Signed)
EPIC Encounter for ICM Monitoring  Patient Name: Melinda Mckinney is a 65 y.o. female Date: 12/15/2020 Primary Care Physican: Colon Branch, MD Primary Cardiologist:McLean Electrophysiologist:Taylor Bi-V Pacing:99.6% 10/21/2020 OfficeWeight:215lbs   Time in AT/AF24.0 hr/day (100.0%)  Attempted call to spouse and unable to reach.  Left message to return call. Transmission reviewed.    Optivol thoracic impedancesuggestingfluid levels returned to normal.  Prescribed:  Torsemide20 mg take2tablets (40 mg total)daily.  Spironolactone 25 mg take 1 tablet daily  Farxiga 10 mg take 1 tablet daily  Labs: 11/12/2020 Creatinine 1.75, BUN 40, Potassium 4.9, Sodium 133, GFR 32 10/20/2020 Creatinine1.76, BUN58, Potassium4.3, Sodium136, GQQ76 10/01/2020 Creatinine1.90, BUN54, Potassium5.0, [id number], GFR29  08/25/2020 Creatinine1.65, BUN52, Potassium4.0, Sodium140 A complete set of results can be found in Results Review.  Recommendations: Unable to reach.    Follow-up plan: ICM clinic phone appointment on6/13/2022. 91 day device clinic remote transmission5/04/2021.   EP/Cardiology Office Visits:7/5/2022with Dr. Aundra Dubin.   Copy of ICM check sent to Dr.Taylor.   3 month ICM trend: 12/14/2020.    1 Year ICM trend:       Rosalene Billings, RN 12/15/2020 11:45 AM

## 2020-12-15 NOTE — Telephone Encounter (Signed)
Remote ICM transmission received.  Attempted call to spoue regarding ICM remote transmission and left message to return call.

## 2020-12-21 ENCOUNTER — Ambulatory Visit (INDEPENDENT_AMBULATORY_CARE_PROVIDER_SITE_OTHER): Payer: Medicare Other

## 2020-12-21 DIAGNOSIS — I442 Atrioventricular block, complete: Secondary | ICD-10-CM | POA: Diagnosis not present

## 2020-12-21 LAB — CUP PACEART REMOTE DEVICE CHECK
Battery Remaining Longevity: 106 mo
Battery Voltage: 2.99 V
Brady Statistic RA Percent Paced: 0 %
Brady Statistic RV Percent Paced: 99.82 %
Date Time Interrogation Session: 20220509065729
Implantable Lead Implant Date: 20201109
Implantable Lead Implant Date: 20201109
Implantable Lead Implant Date: 20201109
Implantable Lead Location: 753858
Implantable Lead Location: 753859
Implantable Lead Location: 753860
Implantable Lead Model: 4598
Implantable Lead Model: 5076
Implantable Lead Model: 5076
Implantable Pulse Generator Implant Date: 20201109
Lead Channel Impedance Value: 1102 Ohm
Lead Channel Impedance Value: 1159 Ohm
Lead Channel Impedance Value: 418 Ohm
Lead Channel Impedance Value: 475 Ohm
Lead Channel Impedance Value: 532 Ohm
Lead Channel Impedance Value: 570 Ohm
Lead Channel Impedance Value: 570 Ohm
Lead Channel Impedance Value: 646 Ohm
Lead Channel Impedance Value: 665 Ohm
Lead Channel Impedance Value: 684 Ohm
Lead Channel Impedance Value: 779 Ohm
Lead Channel Impedance Value: 931 Ohm
Lead Channel Impedance Value: 969 Ohm
Lead Channel Impedance Value: 969 Ohm
Lead Channel Pacing Threshold Amplitude: 0.375 V
Lead Channel Pacing Threshold Amplitude: 1.125 V
Lead Channel Pacing Threshold Pulse Width: 0.4 ms
Lead Channel Pacing Threshold Pulse Width: 0.8 ms
Lead Channel Sensing Intrinsic Amplitude: 1.25 mV
Lead Channel Sensing Intrinsic Amplitude: 1.25 mV
Lead Channel Sensing Intrinsic Amplitude: 11.5 mV
Lead Channel Sensing Intrinsic Amplitude: 11.5 mV
Lead Channel Setting Pacing Amplitude: 1.75 V
Lead Channel Setting Pacing Amplitude: 2.5 V
Lead Channel Setting Pacing Pulse Width: 0.4 ms
Lead Channel Setting Pacing Pulse Width: 0.8 ms
Lead Channel Setting Sensing Sensitivity: 2 mV

## 2020-12-22 ENCOUNTER — Other Ambulatory Visit: Payer: Self-pay | Admitting: Internal Medicine

## 2021-01-07 ENCOUNTER — Other Ambulatory Visit: Payer: Self-pay | Admitting: Internal Medicine

## 2021-01-07 NOTE — Progress Notes (Signed)
Remote pacemaker transmission.   

## 2021-01-14 ENCOUNTER — Other Ambulatory Visit: Payer: Self-pay | Admitting: Internal Medicine

## 2021-01-29 NOTE — Progress Notes (Signed)
No ICM remote transmission received for 01/25/2021 and next ICM transmission scheduled for 02/08/2021.

## 2021-02-08 ENCOUNTER — Ambulatory Visit (INDEPENDENT_AMBULATORY_CARE_PROVIDER_SITE_OTHER): Payer: Medicare Other

## 2021-02-08 DIAGNOSIS — Z950 Presence of cardiac pacemaker: Secondary | ICD-10-CM | POA: Diagnosis not present

## 2021-02-08 DIAGNOSIS — I5022 Chronic systolic (congestive) heart failure: Secondary | ICD-10-CM

## 2021-02-10 ENCOUNTER — Other Ambulatory Visit (HOSPITAL_COMMUNITY): Payer: Self-pay | Admitting: *Deleted

## 2021-02-10 DIAGNOSIS — I5022 Chronic systolic (congestive) heart failure: Secondary | ICD-10-CM

## 2021-02-10 MED ORDER — DAPAGLIFLOZIN PROPANEDIOL 10 MG PO TABS: ORAL_TABLET | ORAL | 4 refills | Status: DC

## 2021-02-10 NOTE — Progress Notes (Signed)
EPIC Encounter for ICM Monitoring  Patient Name: Melinda Mckinney is a 65 y.o. female Date: 02/10/2021 Primary Care Physican: Colon Branch, MD Primary Cardiologist: Aundra Dubin Electrophysiologist: Santina Evans Pacing: 99.4%         10/21/2020 Office Weight: 215 lbs            Time in AT/AF  24.0 hr/day (100.0%)                                  Transmission reviewed.     Optivol thoracic impedance suggesting normal fluid levels.   Prescribed:  Torsemide 20 mg take 2 tablets (40 mg total) daily.  Spironolactone 25 mg take 1 tablet daily Farxiga 10 mg take 1 tablet daily   Labs: 11/12/2020 Creatinine 1.75, BUN 40, Potassium 4.9, Sodium 133, GFR 32 10/20/2020 Creatinine 1.76, BUN 58, Potassium 4.3, Sodium 136, GFR 32 10/01/2020 Creatinine 1.90, BUN 54, Potassium 5.0, Sodium 138, GFR 29 08/25/2020 Creatinine 1.65, BUN 52, Potassium 4.0, Sodium 140  A complete set of results can be found in Results Review.   Recommendations:  No changes.    Follow-up plan: ICM clinic phone appointment on 03/15/2021.   91 day device clinic remote transmission 03/22/2021.     EP/Cardiology Office Visits: 02/16/2021 with Dr. Aundra Dubin.     Copy of ICM check sent to Dr. Lovena Le  3 month ICM trend: 02/08/2021.    1 Year ICM trend:       Rosalene Billings, RN 02/10/2021 4:19 PM

## 2021-02-12 ENCOUNTER — Ambulatory Visit (INDEPENDENT_AMBULATORY_CARE_PROVIDER_SITE_OTHER): Payer: Medicare Other | Admitting: Internal Medicine

## 2021-02-12 ENCOUNTER — Encounter: Payer: Self-pay | Admitting: Internal Medicine

## 2021-02-12 ENCOUNTER — Other Ambulatory Visit: Payer: Self-pay

## 2021-02-12 VITALS — BP 136/82 | HR 76 | Temp 98.1°F | Resp 16 | Ht 65.0 in | Wt 205.2 lb

## 2021-02-12 DIAGNOSIS — J40 Bronchitis, not specified as acute or chronic: Secondary | ICD-10-CM

## 2021-02-12 DIAGNOSIS — J9801 Acute bronchospasm: Secondary | ICD-10-CM | POA: Diagnosis not present

## 2021-02-12 MED ORDER — ALBUTEROL SULFATE HFA 108 (90 BASE) MCG/ACT IN AERS: 2.0000 | INHALATION_SPRAY | Freq: Four times a day (QID) | RESPIRATORY_TRACT | 0 refills | Status: DC | PRN

## 2021-02-12 NOTE — Progress Notes (Signed)
Subjective:    Patient ID: Melinda Mckinney, female    DOB: 1956-02-01, 65 y.o.   MRN: [id number]  DOS:  02/12/2021 Type of visit - description: Acute  Here with her husband. They were visiting [place], the patient felt unwell, went to the ER on 02/05/2021: Had fever, nausea, vomiting, severe cough, shortness of breath. She also had chest congestion and wheezing. She also had ear pain and some sore throat. Reportedly, had a COVID and influenza tess: negative. They have some records from there, in [place].  The only medication I recognize is Zithromax which the patient said she took.  At this point she is feeling better. Cough is decreased, still having some wheezing and chest congestion.   Review of Systems No diarrhea. No sinus congestion or pain.  Past Medical History:  Diagnosis Date   Anemia    Anxiety and depression    Asthma    Borderline diabetes    CAD (coronary artery disease) ~2009   stent    Cervical cancer ([place])    "stage 1; had cryotherapy"   CHF (congestive heart failure) (HCC)    Chronic lower back pain    Complete heart block ([place]) 06/2019   s/p AV nodal ablation and CRT-P by Dr Lovena Le   DDD (degenerative disc disease), lumbar    Dr. Nelva Bush, recommends lumbar epidural steroid injections L5-S1 to the right   Depression    Fibromyalgia    GERD (gastroesophageal reflux disease)    High cholesterol    History of hiatal hernia    History of kidney stones    Hypertension    Hypothyroidism    Osteoporosis    Permanent atrial fibrillation ([place]) 07/2017   s/p AV nodal ablation   Thyroid disease     Past Surgical History:  Procedure Laterality Date   AV NODE ABLATION N/A 06/24/2019   Procedure: AV NODE ABLATION;  Surgeon: Evans Lance, MD;  Location: [facility];  Service: Cardiovascular;  Laterality: N/A;   BIV PACEMAKER INSERTION CRT-P N/A 06/24/2019   Procedure: BIV PACEMAKER INSERTION CRT-P;  Surgeon: Evans Lance, MD;  Location: [facility];  Service: Cardiovascular;  Laterality: N/A;   CARDIAC CATHETERIZATION  ~ 2008   CARDIOVERSION N/A 08/05/2017   Procedure: CARDIOVERSION;  Surgeon: Evans Lance, MD;  Location: [facility] OR;  Service: Cardiovascular;  Laterality: N/A;   CARDIOVERSION N/A 08/24/2017   Procedure: CARDIOVERSION;  Surgeon: Sanda Klein, MD;  Location: [facility] ENDOSCOPY;  Service: Cardiovascular;  Laterality: N/A;   CARDIOVERSION N/A 06/19/2019   Procedure: CARDIOVERSION;  Surgeon: Larey Dresser, MD;  Location: [place] [facility];  Service: Cardiovascular;  Laterality: N/A;   CARDIOVERSION N/A 06/21/2019   Procedure: CARDIOVERSION;  Surgeon: Larey Dresser, MD;  Location: [facility] ENDOSCOPY;  Service: Cardiovascular;  Laterality: N/A;   [place]; ~ 1978/1979; 1984   ESOPHAGOMYOTOMY  2009   GYNECOLOGIC CRYOSURGERY  X 2   "stage 1 cancer"   HERNIA REPAIR  X 6   "all in my stomach" (07/27/2017)   KNEE ARTHROSCOPY Right    NEPHRECTOMY Right 1991   in [place], due to fibrosis and lithiasis   RIGHT/LEFT HEART CATH AND CORONARY ANGIOGRAPHY N/A 06/17/2019   Procedure: RIGHT/LEFT HEART CATH AND CORONARY ANGIOGRAPHY;  Surgeon: Lorretta Harp, MD;  Location: [facility] LAB;  Service: Cardiovascular;  Laterality: N/A;   TEE WITHOUT CARDIOVERSION N/A 06/19/2019   Procedure: TRANSESOPHAGEAL ECHOCARDIOGRAM (TEE);  Surgeon: Larey Dresser,  MD;  Location: [facility] OR;  Service: Cardiovascular;  Laterality: N/A;   TUBAL LIGATION      Allergies as of 02/12/2021       Reactions   Penicillins Swelling, Rash   Has patient had a PCN reaction causing immediate rash, facial/tongue/throat swelling, SOB or lightheadedness with hypotension: No Has patient had a PCN reaction causing severe rash involving mucus membranes or skin necrosis: No Has patient had a PCN reaction that required hospitalization: No Has patient had a PCN reaction occurring within the last 10 years: No If all of the above answers are "NO", then may proceed with  Cephalosporin use.        Medication List        Accurate as of February 12, 2021 11:59 PM. If you have any questions, ask your nurse or doctor.          albuterol 108 (90 Base) MCG/ACT inhaler Commonly known as: VENTOLIN HFA Inhale 2 puffs into the lungs every 6 (six) hours as needed for wheezing or shortness of breath. Started by: Kathlene November, MD   alendronate 70 MG tablet Commonly known as: FOSAMAX Take 1 tablet (70 mg total) by mouth every 7 (seven) days. Take with a full glass of water on an empty stomach.   allopurinol 100 MG tablet Commonly known as: ZYLOPRIM Take 1 tablet (100 mg total) by mouth daily.   atorvastatin 40 MG tablet Commonly known as: LIPITOR Take 1.5 tablets (60 mg total) by mouth at bedtime.   carvedilol 6.25 MG tablet Commonly known as: COREG TAKE ONE TABLET BY MOUTH TWICE A DAY WITH MEALS   clobetasol cream 0.05 % Commonly known as: TEMOVATE   cyclobenzaprine 10 MG tablet Commonly known as: FLEXERIL Take 1 tablet (10 mg total) by mouth 2 (two) times daily as needed for muscle spasms.   dapagliflozin propanediol 10 MG Tabs tablet Commonly known as: Farxiga TAKE ONE TABLET BY MOUTH DAILY BEFORE BREAKFAST   DULoxetine 60 MG capsule Commonly known as: CYMBALTA Take 1 capsule (60 mg total) by mouth daily.   Entresto 97-103 MG Generic drug: sacubitril-valsartan TAKE ONE TABLET BY MOUTH TWICE A DAY   ferrous fumarate 325 (106 Fe) MG Tabs tablet Commonly known as: HEMOCYTE - 106 mg FE Take 1 tablet (106 mg of iron total) by mouth 2 (two) times daily. Do not take at the same time as omeprazole (Prilosec)   ketoconazole 2 % cream Commonly known as: NIZORAL Apply 1 application topically daily.   levothyroxine 125 MCG tablet Commonly known as: SYNTHROID Take 1 tablet (125 mcg total) by mouth daily before breakfast.   metFORMIN 500 MG tablet Commonly known as: GLUCOPHAGE Take 1 tablet (500 mg total) by mouth 2 (two) times daily with a meal.    omeprazole 20 MG capsule Commonly known as: PRILOSEC Take 1 capsule (20 mg total) by mouth 2 (two) times daily before a meal.   Rivaroxaban 15 MG Tabs tablet Commonly known as: Xarelto Take 1 tablet (15 mg total) by mouth daily with supper.   spironolactone 25 MG tablet Commonly known as: ALDACTONE TAKE ONE TABLET BY MOUTH DAILY   torsemide 20 MG tablet Commonly known as: DEMADEX Take 2 tablets (40 mg total) by mouth daily.   Vitamin D3 125 MCG (5000 UT) Caps Take 5,000 Units by mouth daily.           Objective:   Physical Exam BP 136/82 (BP Location: Left Arm, Patient Position: Sitting, Cuff Size: Normal)  Pulse 76   Temp 98.1 F (36.7 C) (Oral)   Resp 16   Ht 5\' 5"  (1.651 m)   Wt 205 lb 4 oz (93.1 kg)   SpO2 98%   BMI 34.16 kg/m  General:   Well developed, NAD, BMI noted. HEENT:  Normocephalic . Face symmetric, atraumatic. TMs: Normal Throat: Symmetric, low-grade Lungs:  Rhonchi with cough, slightly increased expiratory time. Normal respiratory effort, no intercostal retractions, no accessory muscle use. Heart: RRR,  no murmur.  Lower extremities: no pretibial edema bilaterally  Skin: Not pale. Not jaundice Neurologic:  alert & oriented X3.  Speech normal, gait appropriate for age and unassisted Psych--  Cognition and judgment appear intact.  Cooperative with normal attention span and concentration.  Behavior appropriate. No anxious or depressed appearing.      Assessment       Assessment   DM (A1c 6.7 03-2020) HTN High cholesterol Hypothyroidism Anxiety depression, insomnia CV: --CAD: stent ~ 2009 --Atrial fibrillation DX 06-2017  --06-2019 A. fib, RVR, failed cardioversion,, cath: Normal coronaries, had AV nodal ablation, ICD implanted  --Non ischemic cardiomyopathy, DX 06-2019, tachycardia related? Obesity  Vitamin D deficiency, saw Dr. Cruzita Lederer 07-2016, rtc 1 year, celiac dz test (-), unlikely  Malabsortion; cont supplements  GERD H/o  achalasia, s/p [place] 2009 SINGLE KIDNEY---S/p R  Nephrectomy ([place] 1991 due to stones/fibrosis) MSK: --Fibromyalgia --DJD  Knee pain R --Back pain -- s/p epidural 10-16 Osteoporosis: Per Endo as of 10-2020 08-2014 T score -2.8, 09-2015: T score -2.7.- GYN: h/o cervical dysplasia Derm: vulvar pruritus, temovate prn ok per gyn  PLAN: Bronchitis, bronchospasm: Patient was recently seen while visiting [place], had respiratory symptoms, prescribed Zithromax and other medications. She feels better.  Still has some rhonchi and  wheezing >> by history.  In the past she used an inhaler. Plan: Robitussin-DM, albuterol as needed, call if the improvement does not continue.  She has an appointment with me in few days, will reassess the issue at that time.   This visit occurred during the SARS-CoV-2 public health emergency.  Safety protocols were in place, including screening questions prior to the visit, additional usage of staff PPE, and extensive cleaning of exam room while observing appropriate contact time as indicated for disinfecting solutions.

## 2021-02-12 NOTE — Patient Instructions (Signed)
For cough:  Robitussin-DM as needed until better  If you are hear wheezing, use albuterol as needed.  Call if not gradually better

## 2021-02-14 NOTE — Assessment & Plan Note (Signed)
Bronchitis, bronchospasm: Patient was recently seen while visiting [place], had respiratory symptoms, prescribed Zithromax and other medications. She feels better.  Still has some rhonchi and  wheezing >> by history.  In the past she used an inhaler. Plan: Robitussin-DM, albuterol as needed, call if the improvement does not continue.  She has an appointment with me in few days, will reassess the issue at that time.

## 2021-02-16 ENCOUNTER — Encounter (HOSPITAL_COMMUNITY): Payer: Medicare Other | Admitting: Cardiology

## 2021-02-23 ENCOUNTER — Ambulatory Visit (HOSPITAL_BASED_OUTPATIENT_CLINIC_OR_DEPARTMENT_OTHER)
Admission: RE | Admit: 2021-02-23 | Discharge: 2021-02-23 | Disposition: A | Discharge status: Home / Self Care | Payer: Medicare Other | Source: Ambulatory Visit | Attending: Internal Medicine | Admitting: Internal Medicine

## 2021-02-23 ENCOUNTER — Other Ambulatory Visit: Payer: Self-pay

## 2021-02-23 ENCOUNTER — Ambulatory Visit (INDEPENDENT_AMBULATORY_CARE_PROVIDER_SITE_OTHER): Payer: Medicare Other | Admitting: Internal Medicine

## 2021-02-23 ENCOUNTER — Encounter: Payer: Self-pay | Admitting: Internal Medicine

## 2021-02-23 VITALS — BP 124/80 | HR 87 | Temp 98.0°F | Resp 16 | Ht 65.0 in | Wt 208.0 lb

## 2021-02-23 DIAGNOSIS — D649 Anemia, unspecified: Secondary | ICD-10-CM

## 2021-02-23 DIAGNOSIS — E785 Hyperlipidemia, unspecified: Secondary | ICD-10-CM

## 2021-02-23 DIAGNOSIS — M545 Low back pain, unspecified: Secondary | ICD-10-CM | POA: Insufficient documentation

## 2021-02-23 DIAGNOSIS — E039 Hypothyroidism, unspecified: Secondary | ICD-10-CM | POA: Diagnosis not present

## 2021-02-23 DIAGNOSIS — E1159 Type 2 diabetes mellitus with other circulatory complications: Secondary | ICD-10-CM

## 2021-02-23 DIAGNOSIS — Z905 Acquired absence of kidney: Secondary | ICD-10-CM | POA: Diagnosis not present

## 2021-02-23 DIAGNOSIS — M47814 Spondylosis without myelopathy or radiculopathy, thoracic region: Secondary | ICD-10-CM | POA: Diagnosis not present

## 2021-02-23 DIAGNOSIS — N189 Chronic kidney disease, unspecified: Secondary | ICD-10-CM | POA: Diagnosis not present

## 2021-02-23 LAB — BASIC METABOLIC PANEL
BUN: 63 mg/dL — ABNORMAL HIGH (ref 6–23)
CO2: 26 mEq/L (ref 19–32)
Calcium: 9.9 mg/dL (ref 8.4–10.5)
Chloride: 104 mEq/L (ref 96–112)
Creatinine, Ser: 1.84 mg/dL — ABNORMAL HIGH (ref 0.40–1.20)
GFR: 28.08 mL/min — ABNORMAL LOW (ref >60.00)
Glucose, Bld: 110 mg/dL — ABNORMAL HIGH (ref 70–99)
Potassium: 4.3 mEq/L (ref 3.5–5.1)
Sodium: 139 mEq/L (ref 135–145)

## 2021-02-23 LAB — LIPID PANEL
Cholesterol: 175 mg/dL (ref 0–200)
HDL: 58.4 mg/dL (ref >39.00)
LDL Cholesterol: 78 mg/dL (ref 0–99)
NonHDL: 116.88
Total CHOL/HDL Ratio: 3
Triglycerides: 192 mg/dL — ABNORMAL HIGH (ref 0.0–149.0)
VLDL: 38.4 mg/dL (ref 0.0–40.0)

## 2021-02-23 LAB — CBC WITH DIFFERENTIAL/PLATELET
Basophils Absolute: 0.1 10*3/uL (ref 0.0–0.1)
Basophils Relative: 1.1 % (ref 0.0–3.0)
Eosinophils Absolute: 0.1 10*3/uL (ref 0.0–0.7)
Eosinophils Relative: 2 % (ref 0.0–5.0)
HCT: 34.8 % — ABNORMAL LOW (ref 36.0–46.0)
Hemoglobin: 11.6 g/dL — ABNORMAL LOW (ref 12.0–15.0)
Lymphocytes Relative: 30.5 % (ref 12.0–46.0)
Lymphs Abs: 2 10*3/uL (ref 0.7–4.0)
MCHC: 33.4 g/dL (ref 30.0–36.0)
MCV: 97.6 fl (ref 78.0–100.0)
Monocytes Absolute: 0.4 10*3/uL (ref 0.1–1.0)
Monocytes Relative: 6.5 % (ref 3.0–12.0)
Neutro Abs: 3.9 10*3/uL (ref 1.4–7.7)
Neutrophils Relative %: 59.9 % (ref 43.0–77.0)
Platelets: 327 10*3/uL (ref 150.0–400.0)
RBC: 3.57 Mil/uL — ABNORMAL LOW (ref 3.87–5.11)
RDW: 13.9 % (ref 11.5–15.5)
WBC: 6.5 10*3/uL (ref 4.0–10.5)

## 2021-02-23 LAB — TSH: TSH: 0.23 u[IU]/mL — ABNORMAL LOW (ref 0.35–5.50)

## 2021-02-23 LAB — FERRITIN: Ferritin: 48.8 ng/mL (ref 10.0–291.0)

## 2021-02-23 LAB — HEMOGLOBIN A1C: Hgb A1c MFr Bld: 6.4 % (ref 4.6–6.5)

## 2021-02-23 LAB — IRON: Iron: 113 ug/dL (ref 42–145)

## 2021-02-23 NOTE — Patient Instructions (Addendum)
We are referring you to nephrology ("kidney doctor") because your kidney function is decreased  Increase omeprazole 20 mg to two tablets twice daily for 10 days then go back to your normal dose   GO TO THE LAB : Get the blood work     [place], [place] back for a checkup in 4 to 5 months  STOP BY THE FIRST FLOOR:  get the XR

## 2021-02-23 NOTE — Progress Notes (Signed)
Subjective:    Patient ID: Melinda Mckinney, female    DOB: 03/21/1956, 65 y.o.   MRN: [id number]  DOS:  02/23/2021 Type of visit - description: f/u Routine follow-up Was recently seen with bronchitis: Much improved. Has several concerns: Chronic aches- pains, states since she had COVID" dx 2020.  The area of most pain is left from the thoracic spine, worse with certain movements. GERD previously well controlled but sxs  flareup in the last 3 to 4 days: Nausea, vomiting a couple of times, severe heartburn.   Review of Systems No fever chills No chest pain or difficulty breathing No edema  Other than above, a 14 point review of systems is negative   ' Past Medical History:  Diagnosis Date   Anemia    Anxiety and depression    Asthma    Borderline diabetes    CAD (coronary artery disease) ~2009   stent    Cervical cancer ([place])    "stage 1; had cryotherapy"   CHF (congestive heart failure) (HCC)    Chronic lower back pain    Complete heart block ([place]) 06/2019   s/p AV nodal ablation and CRT-P by Dr Lovena Le   DDD (degenerative disc disease), lumbar    Dr. Nelva Bush, recommends lumbar epidural steroid injections L5-S1 to the right   Depression    Fibromyalgia    GERD (gastroesophageal reflux disease)    High cholesterol    History of hiatal hernia    History of kidney stones    Hypertension    Hypothyroidism    Osteoporosis    Permanent atrial fibrillation ([place]) 07/2017   s/p AV nodal ablation   Thyroid disease     Past Surgical History:  Procedure Laterality Date   AV NODE ABLATION N/A 06/24/2019   Procedure: AV NODE ABLATION;  Surgeon: Evans Lance, MD;  Location: [facility];  Service: Cardiovascular;  Laterality: N/A;   BIV PACEMAKER INSERTION CRT-P N/A 06/24/2019   Procedure: BIV PACEMAKER INSERTION CRT-P;  Surgeon: Evans Lance, MD;  Location: [facility];  Service: Cardiovascular;  Laterality: N/A;   CARDIAC CATHETERIZATION  ~ 2008   CARDIOVERSION  N/A 08/05/2017   Procedure: CARDIOVERSION;  Surgeon: Evans Lance, MD;  Location: [facility] OR;  Service: Cardiovascular;  Laterality: N/A;   CARDIOVERSION N/A 08/24/2017   Procedure: CARDIOVERSION;  Surgeon: Sanda Klein, MD;  Location: [facility] ENDOSCOPY;  Service: Cardiovascular;  Laterality: N/A;   CARDIOVERSION N/A 06/19/2019   Procedure: CARDIOVERSION;  Surgeon: Larey Dresser, MD;  Location: [facility];  Service: Cardiovascular;  Laterality: N/A;   CARDIOVERSION N/A 06/21/2019   Procedure: CARDIOVERSION;  Surgeon: Larey Dresser, MD;  Location[facility] ENDOSCOPY;  Service: Cardiovascular;  Laterality: N/A;   [place]; ~ 1978/1979; 1984   ESOPHAGOMYOTOMY  2009   GYNECOLOGIC CRYOSURGERY  X 2   "stage 1 cancer"   HERNIA REPAIR  X 6   "all in my stomach" (07/27/2017)   KNEE ARTHROSCOPY Right    NEPHRECTOMY Right 1991   in [place], due to fibrosis and lithiasis   RIGHT/LEFT HEART CATH AND CORONARY ANGIOGRAPHY N/A 06/17/2019   Procedure: RIGHT/LEFT HEART CATH AND CORONARY ANGIOGRAPHY;  Surgeon: Lorretta Harp, MD;  Location: [place] [facility];  Service: Cardiovascular;  Laterality: N/A;   TEE WITHOUT CARDIOVERSION N/A 06/19/2019   Procedure: TRANSESOPHAGEAL ECHOCARDIOGRAM (TEE);  Surgeon: Larey Dresser, MD;  Location: [facility];  Service: Cardiovascular;  Laterality: N/A;   TUBAL  LIGATION     Social History   Socioeconomic History   Marital status: Married    Spouse name: Not on file   Number of children: 3   Years of education: Not on file   Highest education level: Not on file  Occupational History   Occupation: stay home   Tobacco Use   Smoking status: Former    Packs/day: 2.00    Years: 28.00    Pack years: 56.00    Types: Cigarettes    Quit date: 2000    Years since quitting: 22.5   Smokeless tobacco: Never  Vaping Use   Vaping Use: Never used  Substance and Sexual Activity   Alcohol use: Yes    Alcohol/week: 5.0 standard drinks    Types: 5 Cans of beer per week     Comment: 07/27/2017 "3-4 drinks//month"   Drug use: No   Sexual activity: Not Currently    Comment: 1ST INTERCOURSE- 16, PARTNERS - 2  Other Topics Concern   Not on file  Social History Narrative   Original from [place]   3 children, 2 alive   Household --pt and husband    Social Determinants of Radio broadcast assistant Strain: Not on file  Food Insecurity: Not on file  Transportation Needs: Not on file  Physical Activity: Not on file  Stress: Not on file  Social Connections: Not on file  Intimate Partner Violence: Not on file    Allergies as of 02/23/2021       Reactions   Penicillins Swelling, Rash   Has patient had a PCN reaction causing immediate rash, facial/tongue/throat swelling, SOB or lightheadedness with hypotension: No Has patient had a PCN reaction causing severe rash involving mucus membranes or skin necrosis: No Has patient had a PCN reaction that required hospitalization: No Has patient had a PCN reaction occurring within the last 10 years: No If all of the above answers are "NO", then may proceed with Cephalosporin use.        Medication List        Accurate as of February 23, 2021 11:59 PM. If you have any questions, ask your nurse or doctor.          albuterol 108 (90 Base) MCG/ACT inhaler Commonly known as: VENTOLIN HFA Inhale 2 puffs into the lungs every 6 (six) hours as needed for wheezing or shortness of breath.   alendronate 70 MG tablet Commonly known as: FOSAMAX Take 1 tablet (70 mg total) by mouth every 7 (seven) days. Take with a full glass of water on an empty stomach.   allopurinol 100 MG tablet Commonly known as: ZYLOPRIM Take 1 tablet (100 mg total) by mouth daily.   atorvastatin 40 MG tablet Commonly known as: LIPITOR Take 1.5 tablets (60 mg total) by mouth at bedtime.   carvedilol 6.25 MG tablet Commonly known as: COREG TAKE ONE TABLET BY MOUTH TWICE A DAY WITH MEALS   clobetasol cream 0.05 % Commonly known as:  TEMOVATE   cyclobenzaprine 10 MG tablet Commonly known as: FLEXERIL Take 1 tablet (10 mg total) by mouth 2 (two) times daily as needed for muscle spasms.   dapagliflozin propanediol 10 MG Tabs tablet Commonly known as: Farxiga TAKE ONE TABLET BY MOUTH DAILY BEFORE BREAKFAST   DULoxetine 60 MG capsule Commonly known as: CYMBALTA Take 1 capsule (60 mg total) by mouth daily.   Entresto 97-103 MG Generic drug: sacubitril-valsartan TAKE ONE TABLET BY MOUTH TWICE A DAY   ferrous  fumarate 325 (106 Fe) MG Tabs tablet Commonly known as: HEMOCYTE - 106 mg FE Take 1 tablet (106 mg of iron total) by mouth 2 (two) times daily. Do not take at the same time as omeprazole (Prilosec)   ketoconazole 2 % cream Commonly known as: NIZORAL Apply 1 application topically daily.   levothyroxine 125 MCG tablet Commonly known as: SYNTHROID Take 1 tablet (125 mcg total) by mouth daily before breakfast.   metFORMIN 500 MG tablet Commonly known as: GLUCOPHAGE Take 1 tablet (500 mg total) by mouth 2 (two) times daily with a meal.   omeprazole 20 MG capsule Commonly known as: PRILOSEC Take 1 capsule (20 mg total) by mouth 2 (two) times daily before a meal.   Rivaroxaban 15 MG Tabs tablet Commonly known as: Xarelto Take 1 tablet (15 mg total) by mouth daily with supper.   spironolactone 25 MG tablet Commonly known as: ALDACTONE TAKE ONE TABLET BY MOUTH DAILY   torsemide 20 MG tablet Commonly known as: DEMADEX Take 2 tablets (40 mg total) by mouth daily.   Vitamin D3 125 MCG (5000 UT) Caps Take 5,000 Units by mouth daily.           Objective:   Physical Exam BP 124/80 (BP Location: Left Arm, Patient Position: Sitting, Cuff Size: Normal)   Pulse 87   Temp 98 F (36.7 C) (Oral)   Resp 16   Ht 5\' 5"  (1.651 m)   Wt 208 lb (94.3 kg)   SpO2 98%   BMI 34.61 kg/m  General: Well developed, NAD, BMI noted  HEENT:  Normocephalic . Face symmetric, atraumatic Lungs:  CTA B Normal  respiratory effort, no intercostal retractions, no accessory muscle use. Heart: RRR,  no murmur.   Lower extremities: no pretibial edema bilaterally  Skin: Exposed areas without rash. Not pale. Not jaundice Neurologic:  alert & oriented X3.  Speech normal, gait appropriate for age and unassisted Strength symmetric and appropriate for age.  Psych: Cognition and judgment appear intact.  Cooperative with normal attention span and concentration.  Behavior appropriate. No anxious or depressed appearing.     Assessment      Assessment   DM (A1c 6.7 03-2020) HTN High cholesterol Hypothyroidism Anxiety depression, insomnia CV: --CAD: stent ~ 2009 --Atrial fibrillation DX 06-2017  --06-2019 A. fib, RVR, failed cardioversion,, cath: Normal coronaries, had AV nodal ablation, ICD implanted  --Non ischemic cardiomyopathy, DX 06-2019, tachycardia related? Obesity  Vitamin D deficiency, saw Dr. Cruzita Lederer 07-2016, rtc 1 year, celiac dz test (-), unlikely  Malabsortion; cont supplements  GERD H/o achalasia, s/p [place] 2009 SINGLE KIDNEY---S/p R  Nephrectomy ([place] 1991 due to stones/fibrosis) MSK: --Fibromyalgia --DJD  Knee pain R --Back pain -- s/p epidural 10-16 Osteoporosis: Per Endo as of 10-2020 08-2014 T score -2.8, 09-2015: T score -2.7.- GYN: h/o cervical dysplasia Derm: vulvar pruritus, temovate prn ok per gyn  PLAN:  DM: Currently on Farxiga, metformin, check A1c HTN: Seems well controlled on carvedilol, Aldactone, Demadex.  Check BMP Nonischemic cardiomyopathy: Follow-up by cardiology, no volume overloaded, anticoagulated without apparent problems. High cholesterol: On Lipitor, check FLP Anemia, Achalasia, colon polyps: Had a EGD and colonoscopy 10/08/2020:  Esophagus was tortuous, had a dilatation and random biopsies (results?). Next C-scope in 3-year Checking a CBC, iron, ferritin Single kidney: With CKD, some anemia, refer to nephrology Hypothyroidism: Check  TSH Fibromyalgia: Reports generalized aches and pains particularly left from the thoracic spine since COVID but she has a long history of fibromyalgia, will  get a x-ray of the thoracic spine. GERD: Well-controlled up until 3 days ago, recommend a bland diet, bump up omeprazole 20 mg from 1 tablet BID to 2 tablets B.I.D. RTC 4 to 5 months     This visit occurred during the SARS-CoV-2 public health emergency.  Safety protocols were in place, including screening questions prior to the visit, additional usage of staff PPE, and extensive cleaning of exam room while observing appropriate contact time as indicated for disinfecting solutions.

## 2021-02-24 ENCOUNTER — Encounter: Payer: Self-pay | Admitting: Internal Medicine

## 2021-02-24 NOTE — Assessment & Plan Note (Signed)
DM: Currently on Farxiga, metformin, check A1c HTN: Seems well controlled on carvedilol, Aldactone, Demadex.  Check BMP Nonischemic cardiomyopathy: Follow-up by cardiology, no volume overloaded, anticoagulated without apparent problems. High cholesterol: On Lipitor, check FLP Anemia, Achalasia, colon polyps: Had a EGD and colonoscopy 10/08/2020:  Esophagus was tortuous, had a dilatation and random biopsies (results?). Next C-scope in 3-year Checking a CBC, iron, ferritin Single kidney: With CKD, some anemia, refer to nephrology Hypothyroidism: Check TSH Fibromyalgia: Reports generalized aches and pains particularly left from the thoracic spine since COVID but she has a long history of fibromyalgia, will get a x-ray of the thoracic spine. GERD: Well-controlled up until 3 days ago, recommend a bland diet, bump up omeprazole 20 mg from 1 tablet BID to 2 tablets B.I.D. RTC 4 to 5 months

## 2021-02-26 MED ORDER — LEVOTHYROXINE SODIUM 112 MCG PO TABS: 112.0000 ug | ORAL_TABLET | Freq: Every day | ORAL | 0 refills | Status: DC

## 2021-02-26 NOTE — Addendum Note (Signed)
Addended byDamita Dunnings D on: 02/26/2021 07:36 AM   Modules accepted: Orders

## 2021-03-02 ENCOUNTER — Other Ambulatory Visit (HOSPITAL_COMMUNITY): Payer: Self-pay | Admitting: Cardiology

## 2021-03-02 ENCOUNTER — Encounter (HOSPITAL_COMMUNITY): Payer: Self-pay | Admitting: Cardiology

## 2021-03-02 ENCOUNTER — Ambulatory Visit (HOSPITAL_COMMUNITY)
Admission: RE | Admit: 2021-03-02 | Discharge: 2021-03-02 | Disposition: A | Discharge status: Home / Self Care | Payer: Medicare Other | Source: Ambulatory Visit | Attending: Cardiology | Admitting: Cardiology

## 2021-03-02 ENCOUNTER — Other Ambulatory Visit: Payer: Self-pay

## 2021-03-02 VITALS — BP 92/50 | HR 72 | Wt 211.2 lb

## 2021-03-02 DIAGNOSIS — I2510 Atherosclerotic heart disease of native coronary artery without angina pectoris: Secondary | ICD-10-CM | POA: Diagnosis not present

## 2021-03-02 DIAGNOSIS — I130 Hypertensive heart and chronic kidney disease with heart failure and stage 1 through stage 4 chronic kidney disease, or unspecified chronic kidney disease: Secondary | ICD-10-CM | POA: Insufficient documentation

## 2021-03-02 DIAGNOSIS — Z8616 Personal history of COVID-19: Secondary | ICD-10-CM | POA: Insufficient documentation

## 2021-03-02 DIAGNOSIS — E039 Hypothyroidism, unspecified: Secondary | ICD-10-CM | POA: Insufficient documentation

## 2021-03-02 DIAGNOSIS — R42 Dizziness and giddiness: Secondary | ICD-10-CM | POA: Insufficient documentation

## 2021-03-02 DIAGNOSIS — R0683 Snoring: Secondary | ICD-10-CM

## 2021-03-02 DIAGNOSIS — Z8249 Family history of ischemic heart disease and other diseases of the circulatory system: Secondary | ICD-10-CM | POA: Diagnosis not present

## 2021-03-02 DIAGNOSIS — Z09 Encounter for follow-up examination after completed treatment for conditions other than malignant neoplasm: Secondary | ICD-10-CM | POA: Insufficient documentation

## 2021-03-02 DIAGNOSIS — I4819 Other persistent atrial fibrillation: Secondary | ICD-10-CM | POA: Diagnosis not present

## 2021-03-02 DIAGNOSIS — Z87891 Personal history of nicotine dependence: Secondary | ICD-10-CM | POA: Insufficient documentation

## 2021-03-02 DIAGNOSIS — R0602 Shortness of breath: Secondary | ICD-10-CM | POA: Insufficient documentation

## 2021-03-02 DIAGNOSIS — N1832 Chronic kidney disease, stage 3b: Secondary | ICD-10-CM | POA: Diagnosis not present

## 2021-03-02 DIAGNOSIS — I5022 Chronic systolic (congestive) heart failure: Secondary | ICD-10-CM | POA: Diagnosis not present

## 2021-03-02 DIAGNOSIS — Z7901 Long term (current) use of anticoagulants: Secondary | ICD-10-CM | POA: Insufficient documentation

## 2021-03-02 DIAGNOSIS — I340 Nonrheumatic mitral (valve) insufficiency: Secondary | ICD-10-CM | POA: Insufficient documentation

## 2021-03-02 DIAGNOSIS — Z7984 Long term (current) use of oral hypoglycemic drugs: Secondary | ICD-10-CM | POA: Insufficient documentation

## 2021-03-02 DIAGNOSIS — I428 Other cardiomyopathies: Secondary | ICD-10-CM | POA: Diagnosis not present

## 2021-03-02 DIAGNOSIS — I509 Heart failure, unspecified: Secondary | ICD-10-CM

## 2021-03-02 DIAGNOSIS — Z79899 Other long term (current) drug therapy: Secondary | ICD-10-CM | POA: Insufficient documentation

## 2021-03-02 LAB — BASIC METABOLIC PANEL
Anion gap: 5 (ref 5–15)
BUN: 41 mg/dL — ABNORMAL HIGH (ref 8–23)
CO2: 26 mmol/L (ref 22–32)
Calcium: 9.1 mg/dL (ref 8.9–10.3)
Chloride: 108 mmol/L (ref 98–111)
Creatinine, Ser: 1.61 mg/dL — ABNORMAL HIGH (ref 0.44–1.00)
GFR, Estimated: 35 mL/min — ABNORMAL LOW (ref >60)
Glucose, Bld: 117 mg/dL — ABNORMAL HIGH (ref 70–99)
Potassium: 4.3 mmol/L (ref 3.5–5.1)
Sodium: 139 mmol/L (ref 135–145)

## 2021-03-02 MED ORDER — ENTRESTO 49-51 MG PO TABS: 1.0000 | ORAL_TABLET | Freq: Two times a day (BID) | ORAL | 5 refills | Status: DC

## 2021-03-02 MED ORDER — TORSEMIDE 20 MG PO TABS: 20.0000 mg | ORAL_TABLET | Freq: Every day | ORAL | 3 refills | Status: DC

## 2021-03-02 MED ORDER — SPIRONOLACTONE 25 MG PO TABS: 25.0000 mg | ORAL_TABLET | Freq: Every day | ORAL | 3 refills | Status: DC

## 2021-03-02 NOTE — Telephone Encounter (Signed)
PT REQUESTS REFILL OF XARELTO 15MG  BUT CCR IS 50.8 WILL ROUTE TO [name] POOL FOR CLARIFICATION. Prescription refill request for Xarelto received.  Indication:AFIB Last office visit:03/02/21 [facility] Weight:94.3KG Age:73F Scr:1.6 03/02/21 CrCl:50.8

## 2021-03-02 NOTE — Patient Instructions (Signed)
Decrease Entresto to 49/51 mg Twice daily  Decrease Torsemide to 20 mg daily  Labs done today, your results will be available in MyChart, we will contact you for abnormal readings.  You have been referred for in hospital sleep study, they will call you to schedule an appointment  Your physician recommends that you schedule a follow-up appointment in: 6 weeks  If you have any questions or concerns before your next appointment please send [place] a message through [place] or call our office at [phone].    TO LEAVE A MESSAGE FOR THE NURSE SELECT OPTION 2, PLEASE LEAVE A MESSAGE INCLUDING: YOUR NAME DATE OF BIRTH CALL BACK NUMBER REASON FOR CALL**this is important as we prioritize the call backs  YOU WILL RECEIVE A CALL BACK THE SAME DAY AS LONG AS YOU CALL BEFORE 4:00 PM  milAt the [facility], you and your health needs are our priority. As part of our continuing mission to provide you with exceptional heart care, we have created designated Provider Care Teams. These Care Teams include your primary Cardiologist (physician) and Advanced Practice Providers (APPs- Physician Assistants and Nurse Practitioners) who all work together to provide you with the care you need, when you need it.   You may see any of the following providers on your designated Care Team at your next follow up: Dr Glori Bickers Dr [name] Dr Patrice Paradise, NP Lyda Jester, [place] Ginnie Smart Audry Riles, [name]   Please be sure to bring in all your medications bottles to every appointment.

## 2021-03-02 NOTE — Progress Notes (Signed)
PCP: Colon Branch, MD  HF Cardiology: Dr. Aundra Dubin  65 y.o. with CAD s/p PCI in 2010, chronic combined systolic and diastolic HF (EF 09-73% in 2018), atrial fibrillation on chronic anticoagulation w/ Xarelto, HTN, and hypothyroidism was diagnosed w/ COVID-19 in Sep 2020 and treated at [facility] infection c/b PNA. She was discharged and readmitted back to [facility] 10/9-10/13/20 for gastroenteritis, felt to be a sequela of COVID-19. She was managed w/ supportive care and discharged home. Had f/u with PCP 10/22 and endorsed worsening LLQ pain leading to abdominal CT which showed acute diverticulitis. She was started on outpatient antibiotics, cipro + flagyl but symptoms failed to improve, prompting her to seek emergency medical care at [facility].  Initial EKG in the ED showed afib w/ RVR for which general cardiology was consulted. She was started on IV dilt for rate control but later required treatment w/ IV amiodarone due to persistent rapid ventricular response. Hospital course c/b volume overload, requiring IV diuretics. 2D echo was obtained and showed worsening LVEF, now 25-30% w/ moderate MR. She underwent subsequent R/LHC in 11/20 which demonstrated widely patent coronaries. RHC showed elevated mean RA pressure at 22, PWP 43, LVEDP 45 and low CO. CI by Fick 1.6. She underwent TEE-guided DCCV, TEE showed EF 15% with moderate RV dysfunction and moderate MR.  She went back into rapid atrial fibrillation.  She failed additional cardioversions.  Despite amiodarone gtt, HR remained in the 140s-150s.  Finally, she underwent AV nodal ablation with BiV pacing due to inability to control atrial fibrillation. While in the hospital, she was on milrinone gtt for a period of time, but we were able to taper it off. She was discharged home after medication optimization.   Echo in 2/21 showed EF up to 40-45%, mild diffuse hypokinesis, RV low normal function.  Echo in 3/22 showed EF 55-60%, RV normal, severe  LAE.    Patient presents for followup of CHF.  She reports orthostatic symptoms, gets lightheaded with standing up.  She is short of breath if she walks fast.  No chest pain.  No palpitations.  She chronically sleeps on 2 pillows.     Labs (11/20): digoxin level 1.4, creatinine 1.81, K 4.8, hgb 13.9 Labs (1/21): LDL 91, K 3.7, creatinine 1.32 Labs (2/21): K 5.1, creatinine 1.37, TSH normal, digoxin 0.4 Labs (5/21): LDL 68, digoxin 0.3 Labs (6/21): K 4.4, creatinine 1.21 Labs (1/22): K 4, creatinine 1.65, TSH normal Labs (7/22): LDL 78, K 4.3, creatinine 1.84  Medtronic device interrogation: 98% BiV pacing, 100% atrial fibrillation, fluid index < threshold.    PMH: 1. Hypothyroidism 2. Single kidney s/p nephrectomy 3. Atrial fibrillation: Initially diagnosed in 2018.  She was on sotalol at one point to maintain NSR.  - Uncontrollable atrial fibrillation during 11/20 admission, patient eventually had AV nodal ablation with CRT-P implantation.  4. H/o achalasia 5. GERD 6. Fibromyalgia 7. H/o diverticulitis in 10/20 8. COVID-19 in 9/20.  9. H/o cervical cancer: cryotherapy.  10. Degenerative disc disease.  11. Chronic systolic CHF: Nonischemic cardiomyopathy, possibly tachycardia-mediated in setting of uncontrolled atrial fibrillation. Medtronic CRT-P device.  - Echo (2018): EF 45-50%.  - Echo (11/20): EF 25-30%, mildly decreased RV systolic function, moderate MR.  - TEE (11/20): EF 15%, moderately decreased RV systolic function, moderate MR.  - Cardiac MRI (11/20): EF 34%, RV EF 30%, no LGE (done after AV nodal ablation and BiV pacing).  - RHC (11/20): mean RA 22, mean PCWP 43, LVEDP 45,  CI 1.6 (Fick) and 2.7 (thermodilution).  - Echo (2/21): EF 40-45%, mild diffuse hypokinesis, low normal RV systolic function.  - Echo (5/22): EF 55-60%, RV normal, severe LAE 12. CAD: PCI in 2010.  - LHC (11/20): No significant coronary disease.   Social History   Socioeconomic History   Marital  status: Married    Spouse name: Not on file   Number of children: 3   Years of education: Not on file   Highest education level: Not on file  Occupational History   Occupation: stay home   Tobacco Use   Smoking status: Former    Packs/day: 2.00    Years: 28.00    Pack years: 56.00    Types: Cigarettes    Quit date: 2000    Years since quitting: 22.5   Smokeless tobacco: Never  Vaping Use   Vaping Use: Never used  Substance and Sexual Activity   Alcohol use: Yes    Alcohol/week: 5.0 standard drinks    Types: 5 Cans of beer per week    Comment: 07/27/2017 "3-4 drinks//month"   Drug use: No   Sexual activity: Not Currently    Comment: 1ST INTERCOURSE- 16, PARTNERS - 2  Other Topics Concern   Not on file  Social History Narrative   Original from [place]   3 children, 2 alive   Household --pt and husband    Social Determinants of Radio broadcast assistant Strain: Not on file  Food Insecurity: Not on file  Transportation Needs: Not on file  Physical Activity: Not on file  Stress: Not on file  Social Connections: Not on file  Intimate Partner Violence: Not on file   Family History  Problem Relation Age of Onset   Breast cancer Other        aunt    CAD Brother    Lung cancer Mother        F and M   Diabetes Father        F and mother    CAD Father    Lung cancer Father    Stroke Sister    Colon cancer Neg Hx    ROS: All systems reviewed and negative except as per HPI.   Current Outpatient Medications  Medication Sig Dispense Refill   alendronate (FOSAMAX) 70 MG tablet Take 1 tablet (70 mg total) by mouth every 7 (seven) days. Take with a full glass of water on an empty stomach. 12 tablet 3   allopurinol (ZYLOPRIM) 100 MG tablet Take 1 tablet (100 mg total) by mouth daily. 90 tablet 1   atorvastatin (LIPITOR) 40 MG tablet Take 1.5 tablets (60 mg total) by mouth at bedtime. 135 tablet 1   carvedilol (COREG) 6.25 MG tablet TAKE ONE TABLET BY MOUTH TWICE A DAY  WITH MEALS 180 tablet 3   Cholecalciferol (VITAMIN D3) 5000 UNITS CAPS Take 5,000 Units by mouth daily.      clobetasol cream (TEMOVATE) 0.05 %      cyclobenzaprine (FLEXERIL) 10 MG tablet Take 1 tablet (10 mg total) by mouth 2 (two) times daily as needed for muscle spasms. 60 tablet 1   dapagliflozin propanediol (FARXIGA) 10 MG TABS tablet TAKE ONE TABLET BY MOUTH DAILY BEFORE BREAKFAST 30 tablet 4   DULoxetine (CYMBALTA) 60 MG capsule Take 1 capsule (60 mg total) by mouth daily. 90 capsule 1   ferrous fumarate (HEMOCYTE - 106 MG FE) 325 (106 Fe) MG TABS tablet Take 1 tablet (106 mg of  iron total) by mouth 2 (two) times daily. Do not take at the same time as omeprazole (Prilosec) 60 tablet 6   ketoconazole (NIZORAL) 2 % cream Apply 1 application topically daily. 60 g 0   levothyroxine (SYNTHROID) 112 MCG tablet Take 1 tablet (112 mcg total) by mouth daily before breakfast. 90 tablet 0   metFORMIN (GLUCOPHAGE) 500 MG tablet Take 1 tablet (500 mg total) by mouth 2 (two) times daily with a meal. 180 tablet 1   omeprazole (PRILOSEC) 20 MG capsule Take 1 capsule (20 mg total) by mouth 2 (two) times daily before a meal. 180 capsule 3   Rivaroxaban (XARELTO) 15 MG TABS tablet Take 1 tablet (15 mg total) by mouth daily with supper. 30 tablet 3   sacubitril-valsartan (ENTRESTO) 49-51 MG Take 1 tablet by mouth 2 (two) times daily. 60 tablet 5   spironolactone (ALDACTONE) 25 MG tablet Take 1 tablet (25 mg total) by mouth daily. 90 tablet 3   torsemide (DEMADEX) 20 MG tablet Take 1 tablet (20 mg total) by mouth daily. 180 tablet 3   No current facility-administered medications for this encounter.   BP (!) 92/50   Pulse 72   Wt 95.8 kg (211 lb 3.2 oz)   SpO2 98%   BMI 35.15 kg/m  General: NAD Neck: No JVD, no thyromegaly or thyroid nodule.  Lungs: Clear to auscultation bilaterally with normal respiratory effort. CV: Nondisplaced PMI.  Heart regular S1/S2, no S3/S4, no murmur.  No peripheral edema.  No  carotid bruit.  Normal pedal pulses.  Abdomen: Soft, nontender, no hepatosplenomegaly, no distention.  Skin: Intact without lesions or rashes.  Neurologic: Alert and oriented x 3.  Psych: Normal affect. Extremities: No clubbing or cyanosis.  HEENT: Normal.   Assessment/Plan: 1. Chronic systolic CHF:  Nonischemic cardiomyopathy.  Echo in 2018 with EF 45-50%.  Echo in 11/20 with EF 25-30%, diffuse hypokinesis, mildly decreased RV systolic function, moderate MR. LHC/RHC in 11/20 with no significant coronary disease, markedly elevated filling pressures and low cardiac output (CI 1.6).  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 Cardiac MRI 11/20 showed LVEF 34%, RVEF 30%, no LGE => perhaps pointing to tachy-mediated CMP as more likely etiology.  Initially required inotropic support w/ milrinone but able to titrate off.  Now s/p AV nodal ablation with MDT CRT-P.  Echo in 2/21 showed EF up to 40-45%, echo in 3/22 with EF up to 55-60%.  NYHA class II symptoms, not volume overloaded by exam or Optivol.  She has orthostatic symptoms.  - I think that she can decrease torsemide to 20 mg daily. BMET/BNP today.  - Decrease Entresto to 49/51 bid with orthostatic symptoms.   - Continue spironolactone to 25 daily.  - Continue dapagliflozin 10 mg daily.  - Continue Coreg 6.25 mg bid.   2. Atrial fibrillation: Persistent atrial fibrillation, it appears, at least since 9/20 when she was admitted with COVID-19. Prior, she had paroxysmal atrial fibrillation and was maintained on sotalol. She is now off sotalol.  Unable to cardiovert or rate control atrial fibrillation. Therefore, she had AV nodal ablation with BiV pacing.  - Now off amiodarone.   - Continue Xarelto 15 mg daily.  3. Mitral regurgitation: Functional MR, moderate on TEE.  Rate was controlled on cardiac MRI, MR was less impressive.  Minimal MR  on echo in 2/21.  4. CKD stage 3b: BMET today.  5. Suspect OSA: I will arrange for sleep study, she wants to do this in the hospital.   Followup with NP/PA in 6 wks.   [name] 03/02/2021

## 2021-03-03 NOTE — Telephone Encounter (Signed)
SCr improved to 1.61 on labs yesterday, historically 1.7-1.9. Even with improved SCr, clearance still very borderline at 51. Will continue lower Xarelto 15mg  dose based on prior scr trends.

## 2021-03-04 DIAGNOSIS — H2513 Age-related nuclear cataract, bilateral: Secondary | ICD-10-CM | POA: Diagnosis not present

## 2021-03-04 DIAGNOSIS — D22111 Melanocytic nevi of right upper eyelid, including canthus: Secondary | ICD-10-CM | POA: Diagnosis not present

## 2021-03-04 DIAGNOSIS — H43813 Vitreous degeneration, bilateral: Secondary | ICD-10-CM | POA: Diagnosis not present

## 2021-03-04 DIAGNOSIS — E119 Type 2 diabetes mellitus without complications: Secondary | ICD-10-CM | POA: Diagnosis not present

## 2021-03-04 LAB — HM DIABETES EYE EXAM

## 2021-03-15 ENCOUNTER — Ambulatory Visit (INDEPENDENT_AMBULATORY_CARE_PROVIDER_SITE_OTHER): Payer: Medicare Other

## 2021-03-15 ENCOUNTER — Telehealth: Payer: Self-pay

## 2021-03-15 ENCOUNTER — Other Ambulatory Visit: Payer: Self-pay | Admitting: Internal Medicine

## 2021-03-15 DIAGNOSIS — I5022 Chronic systolic (congestive) heart failure: Secondary | ICD-10-CM

## 2021-03-15 DIAGNOSIS — Z950 Presence of cardiac pacemaker: Secondary | ICD-10-CM | POA: Diagnosis not present

## 2021-03-15 NOTE — Telephone Encounter (Signed)
Remote ICM transmission received.  Attempted call to husband regarding ICM remote transmission and left message per DPR to return call.

## 2021-03-15 NOTE — Progress Notes (Signed)
EPIC Encounter for ICM Monitoring  Patient Name: Melinda Mckinney is a 65 y.o. female Date: 03/15/2021 Primary Care Physican: Colon Branch, MD Primary Cardiologist: Aundra Dubin Electrophysiologist: Santina Evans Pacing: 99.0%         10/21/2020 Office Weight: 215 lbs            Time in AT/AF  24.0 hr/day (100.0%)                                  Attempted call to husband and unable to reach.  Left detailed message per DPR regarding transmission. Transmission reviewed.    Optivol thoracic impedance suggesting possible fluid accumulation starting 7/20 (correlates with Torsemide dosage decrease).   Prescribed:  Torsemide 20 mg take 1 tablet (20 mg total) daily (decreased from 40 mg on 03/02/21).  Spironolactone 25 mg take 1 tablet daily Farxiga 10 mg take 1 tablet daily   Labs: 03/02/2021 Creatinine 1.61, BUN 41, Potassium 4.3, Sodium 139 02/23/2021 Creatinine 1.84, BUN 63, Potassium 4.3, Sodium 139, GFR 28.08 11/12/2020 Creatinine 1.75, BUN 40, Potassium 4.9, Sodium 133, GFR 32 10/20/2020 Creatinine 1.76, BUN 58, Potassium 4.3, Sodium 136, GFR 32 10/01/2020 Creatinine 1.90, BUN 54, Potassium 5.0, Sodium 138, GFR 29 08/25/2020 Creatinine 1.65, BUN 52, Potassium 4.0, Sodium 140  A complete set of results can be found in Results Review.   Recommendations:  Unable to reach.   Follow-up plan: ICM clinic phone appointment on 03/22/2021 to recheck fluid levels.   91 day device clinic remote transmission 03/22/2021.     EP/Cardiology Office Visits: 04/13/2021 with [facility] NP/PA.  8/22 with Dr Rayann Heman.     Copy of ICM check sent to Dr. Lovena Le.   3 month ICM trend: 03/15/2021.    1 Year ICM trend:       Rosalene Billings, RN 03/15/2021 1:01 PM

## 2021-03-15 NOTE — Progress Notes (Signed)
Attempted call to husband per DPR to provide Dr Claris Gladden recommendations.  Left message to return call.

## 2021-03-15 NOTE — Progress Notes (Signed)
Increase torsemide back to 40 mg daily, BMET 10 days.

## 2021-03-16 NOTE — Progress Notes (Signed)
Attempted call to husband per DPR to provide Dr Claris Gladden recommendations.  Left message to return call.

## 2021-03-19 NOTE — Progress Notes (Signed)
Unable to reach husband after numerous attempts. Will recheck fluid levels on 8/8 and if suggesting fluid will attempt to provide Dr Claris Gladden recommendations.

## 2021-03-22 ENCOUNTER — Telehealth: Payer: Self-pay

## 2021-03-22 ENCOUNTER — Ambulatory Visit (INDEPENDENT_AMBULATORY_CARE_PROVIDER_SITE_OTHER): Payer: Medicare Other

## 2021-03-22 DIAGNOSIS — I428 Other cardiomyopathies: Secondary | ICD-10-CM | POA: Diagnosis not present

## 2021-03-22 DIAGNOSIS — Z950 Presence of cardiac pacemaker: Secondary | ICD-10-CM

## 2021-03-22 DIAGNOSIS — I5022 Chronic systolic (congestive) heart failure: Secondary | ICD-10-CM

## 2021-03-22 LAB — CUP PACEART REMOTE DEVICE CHECK
Battery Remaining Longevity: 102 mo
Battery Voltage: 2.99 V
Brady Statistic RA Percent Paced: 0 %
Brady Statistic RV Percent Paced: 99.61 %
Date Time Interrogation Session: 20220808020729
Implantable Lead Implant Date: 20201109
Implantable Lead Implant Date: 20201109
Implantable Lead Implant Date: 20201109
Implantable Lead Location: 753858
Implantable Lead Location: 753859
Implantable Lead Location: 753860
Implantable Lead Model: 4598
Implantable Lead Model: 5076
Implantable Lead Model: 5076
Implantable Pulse Generator Implant Date: 20201109
Lead Channel Impedance Value: 1007 Ohm
Lead Channel Impedance Value: 1026 Ohm
Lead Channel Impedance Value: 1064 Ohm
Lead Channel Impedance Value: 1254 Ohm
Lead Channel Impedance Value: 1292 Ohm
Lead Channel Impedance Value: 399 Ohm
Lead Channel Impedance Value: 456 Ohm
Lead Channel Impedance Value: 494 Ohm
Lead Channel Impedance Value: 494 Ohm
Lead Channel Impedance Value: 589 Ohm
Lead Channel Impedance Value: 703 Ohm
Lead Channel Impedance Value: 703 Ohm
Lead Channel Impedance Value: 741 Ohm
Lead Channel Impedance Value: 836 Ohm
Lead Channel Pacing Threshold Amplitude: 0.375 V
Lead Channel Pacing Threshold Amplitude: 1 V
Lead Channel Pacing Threshold Pulse Width: 0.4 ms
Lead Channel Pacing Threshold Pulse Width: 0.8 ms
Lead Channel Sensing Intrinsic Amplitude: 1 mV
Lead Channel Sensing Intrinsic Amplitude: 1 mV
Lead Channel Sensing Intrinsic Amplitude: 10.375 mV
Lead Channel Sensing Intrinsic Amplitude: 10.375 mV
Lead Channel Setting Pacing Amplitude: 1.75 V
Lead Channel Setting Pacing Amplitude: 2.5 V
Lead Channel Setting Pacing Pulse Width: 0.4 ms
Lead Channel Setting Pacing Pulse Width: 0.8 ms
Lead Channel Setting Sensing Sensitivity: 2 mV

## 2021-03-22 MED ORDER — TORSEMIDE 20 MG PO TABS: 40.0000 mg | ORAL_TABLET | Freq: Every day | ORAL | 3 refills | Status: DC

## 2021-03-22 NOTE — Telephone Encounter (Signed)
Attempted call to husband.  Pt stated he was not home yet.  Call back later this evening

## 2021-03-22 NOTE — Progress Notes (Signed)
EPIC Encounter for ICM Monitoring  Patient Name: Melinda Mckinney is a 65 y.o. female Date: 03/22/2021 Primary Care Physican: Colon Branch, MD Primary Cardiologist: Aundra Dubin Electrophysiologist: Santina Evans Pacing: 99.0%         03/22/2021 Weight: 211 lbs (204 lbs is baseline)       Time in AT/AF  24.0 hr/day (100.0%)                                  Spoke with husband. He reported patient has some swelling in feet and weight gain of 5 lbs.   Unable to reach spouse last week to provide Dr Claris Gladden recommendation to increase Torsemide to 40 mg daily.     Optivol thoracic impedance suggesting possible fluid accumulation starting 7/20 (correlates with Torsemide dosage decrease).   Prescribed:  Torsemide 20 mg take 2 tablets (40 mg total) daily (increased on 03/22/2021).  Spironolactone 25 mg take 1 tablet daily Farxiga 10 mg take 1 tablet daily   Labs: BMET scheduled 8/22 at [facility] since patient has OV with Dr Rayann Heman that day. 03/02/2021 Creatinine 1.61, BUN 41, Potassium 4.3, Sodium 139 02/23/2021 Creatinine 1.84, BUN 63, Potassium 4.3, Sodium 139, GFR 28.08 11/12/2020 Creatinine 1.75, BUN 40, Potassium 4.9, Sodium 133, GFR 32 10/20/2020 Creatinine 1.76, BUN 58, Potassium 4.3, Sodium 136, GFR 32 10/01/2020 Creatinine 1.90, BUN 54, Potassium 5.0, Sodium 138, GFR 29 08/25/2020 Creatinine 1.65, BUN 52, Potassium 4.0, Sodium 140  A complete set of results can be found in Results Review.   Recommendations:  Advised of Dr Claris Gladden recommendation given on 8/1 (see 8/1 ICM note) to increase Torsemide to 40 mg daily and BMET in 10 days   Follow-up plan: ICM clinic phone appointment on 03/26/2021 to recheck fluid levels.   91 day device clinic remote transmission 03/22/2021.     EP/Cardiology Office Visits: 04/13/2021 with [facility] NP/PA.  04/05/2021 with Dr Rayann Heman.     Copy of ICM check sent to Dr. Lovena Le.   3 month ICM trend: 03/22/2021.    1 Year ICM trend:       Rosalene Billings, RN 03/22/2021 1:37 PM

## 2021-03-22 NOTE — Telephone Encounter (Signed)
Attempted ICM call to spouse.  Pt answered phone with limited [place].  Her spouse will be home from work later today and requested I call back at 4 pm.

## 2021-03-24 ENCOUNTER — Other Ambulatory Visit: Payer: Self-pay

## 2021-03-24 ENCOUNTER — Emergency Department (HOSPITAL_BASED_OUTPATIENT_CLINIC_OR_DEPARTMENT_OTHER)
Admission: EM | Admit: 2021-03-24 | Discharge: 2021-03-24 | Disposition: A | Discharge status: Home / Self Care | Payer: Medicare Other | Attending: Emergency Medicine | Admitting: Emergency Medicine

## 2021-03-24 ENCOUNTER — Emergency Department (HOSPITAL_BASED_OUTPATIENT_CLINIC_OR_DEPARTMENT_OTHER): Payer: Medicare Other

## 2021-03-24 ENCOUNTER — Encounter (HOSPITAL_BASED_OUTPATIENT_CLINIC_OR_DEPARTMENT_OTHER): Payer: Self-pay

## 2021-03-24 DIAGNOSIS — Z950 Presence of cardiac pacemaker: Secondary | ICD-10-CM | POA: Diagnosis not present

## 2021-03-24 DIAGNOSIS — I2510 Atherosclerotic heart disease of native coronary artery without angina pectoris: Secondary | ICD-10-CM | POA: Diagnosis not present

## 2021-03-24 DIAGNOSIS — E119 Type 2 diabetes mellitus without complications: Secondary | ICD-10-CM | POA: Insufficient documentation

## 2021-03-24 DIAGNOSIS — Z8541 Personal history of malignant neoplasm of cervix uteri: Secondary | ICD-10-CM | POA: Insufficient documentation

## 2021-03-24 DIAGNOSIS — Z8616 Personal history of COVID-19: Secondary | ICD-10-CM | POA: Diagnosis not present

## 2021-03-24 DIAGNOSIS — Z79899 Other long term (current) drug therapy: Secondary | ICD-10-CM | POA: Diagnosis not present

## 2021-03-24 DIAGNOSIS — I110 Hypertensive heart disease with heart failure: Secondary | ICD-10-CM | POA: Insufficient documentation

## 2021-03-24 DIAGNOSIS — Z7901 Long term (current) use of anticoagulants: Secondary | ICD-10-CM | POA: Insufficient documentation

## 2021-03-24 DIAGNOSIS — I509 Heart failure, unspecified: Secondary | ICD-10-CM | POA: Diagnosis not present

## 2021-03-24 DIAGNOSIS — R1011 Right upper quadrant pain: Secondary | ICD-10-CM | POA: Diagnosis not present

## 2021-03-24 DIAGNOSIS — E039 Hypothyroidism, unspecified: Secondary | ICD-10-CM | POA: Insufficient documentation

## 2021-03-24 DIAGNOSIS — Z87891 Personal history of nicotine dependence: Secondary | ICD-10-CM | POA: Insufficient documentation

## 2021-03-24 DIAGNOSIS — Z7984 Long term (current) use of oral hypoglycemic drugs: Secondary | ICD-10-CM | POA: Insufficient documentation

## 2021-03-24 DIAGNOSIS — J45909 Unspecified asthma, uncomplicated: Secondary | ICD-10-CM | POA: Insufficient documentation

## 2021-03-24 LAB — CBC
HCT: 39.3 % (ref 36.0–46.0)
Hemoglobin: 12.7 g/dL (ref 12.0–15.0)
MCH: 32.3 pg (ref 26.0–34.0)
MCHC: 32.3 g/dL (ref 30.0–36.0)
MCV: 100 fL (ref 80.0–100.0)
Platelets: 293 10*3/uL (ref 150–400)
RBC: 3.93 MIL/uL (ref 3.87–5.11)
RDW: 14.1 % (ref 11.5–15.5)
WBC: 6.4 10*3/uL (ref 4.0–10.5)
nRBC: 0 % (ref 0.0–0.2)

## 2021-03-24 LAB — URINALYSIS, ROUTINE W REFLEX MICROSCOPIC
Bilirubin Urine: NEGATIVE
Glucose, UA: NEGATIVE mg/dL
Hgb urine dipstick: NEGATIVE
Ketones, ur: NEGATIVE mg/dL
Leukocytes,Ua: NEGATIVE
Nitrite: NEGATIVE
Protein, ur: NEGATIVE mg/dL
Specific Gravity, Urine: 1.02 (ref 1.005–1.030)
pH: 5 (ref 5.0–8.0)

## 2021-03-24 LAB — COMPREHENSIVE METABOLIC PANEL
ALT: 18 U/L (ref 0–44)
AST: 23 U/L (ref 15–41)
Albumin: 4.2 g/dL (ref 3.5–5.0)
Alkaline Phosphatase: 91 U/L (ref 38–126)
Anion gap: 11 (ref 5–15)
BUN: 35 mg/dL — ABNORMAL HIGH (ref 8–23)
CO2: 25 mmol/L (ref 22–32)
Calcium: 9.8 mg/dL (ref 8.9–10.3)
Chloride: 101 mmol/L (ref 98–111)
Creatinine, Ser: 1.8 mg/dL — ABNORMAL HIGH (ref 0.44–1.00)
GFR, Estimated: 30 mL/min — ABNORMAL LOW (ref >60)
Glucose, Bld: 116 mg/dL — ABNORMAL HIGH (ref 70–99)
Potassium: 4.5 mmol/L (ref 3.5–5.1)
Sodium: 137 mmol/L (ref 135–145)
Total Bilirubin: 0.5 mg/dL (ref 0.3–1.2)
Total Protein: 7.3 g/dL (ref 6.5–8.1)

## 2021-03-24 LAB — LIPASE, BLOOD: Lipase: 27 U/L (ref 11–51)

## 2021-03-24 MED ORDER — SODIUM CHLORIDE 0.9 % IV BOLUS
1000.0000 mL | Freq: Once | INTRAVENOUS | Status: AC
  Administered 2021-03-24: 1000 mL via INTRAVENOUS

## 2021-03-24 MED ORDER — MORPHINE SULFATE (PF) 4 MG/ML IV SOLN
4.0000 mg | Freq: Once | INTRAVENOUS | Status: DC
Start: 2021-03-24 — End: 2021-03-24
  Filled 2021-03-24: qty 1

## 2021-03-24 MED ORDER — FENTANYL CITRATE (PF) 100 MCG/2ML IJ SOLN
50.0000 ug | Freq: Once | INTRAMUSCULAR | Status: AC
  Administered 2021-03-24: 50 ug via INTRAVENOUS
  Filled 2021-03-24: qty 2

## 2021-03-24 MED ORDER — OXYCODONE-ACETAMINOPHEN 5-325 MG PO TABS: 1.0000 | ORAL_TABLET | Freq: Four times a day (QID) | ORAL | 0 refills | Status: DC | PRN

## 2021-03-24 MED ORDER — OXYCODONE-ACETAMINOPHEN 5-325 MG PO TABS
1.0000 | ORAL_TABLET | ORAL | Status: DC | PRN
  Administered 2021-03-24: 1 via ORAL
  Filled 2021-03-24: qty 1

## 2021-03-24 NOTE — ED Triage Notes (Signed)
Pt arrives with husband who states patients dog jumped on her right abdomen 1 week ago. States pain to abdomen has been worsening since. Denies N/V/D. Tenderness to palpation

## 2021-03-24 NOTE — ED Notes (Signed)
Pt teaching provided on medications that may cause drowsiness. Pt instructed not to drive or operate heavy machinery while taking the prescribed medication. Pt verbalized understanding.  ? ?Pt provided discharge instructions and prescription information. Pt was given the opportunity to ask questions and questions were answered. Discharge signature not obtained in the setting of the COVID-19 pandemic in order to reduce high touch surfaces.  ? ?

## 2021-03-24 NOTE — Discharge Instructions (Addendum)
Take medications for pain.  It may cause dizziness or nausea to take precautions to avoid falling.  Follow-up with your doctor as needed within the next 3 to 4 days.

## 2021-03-24 NOTE — ED Provider Notes (Signed)
[facility] Provider Note   CSN: [id number] Arrival date & time: 03/24/21  1000     History Chief Complaint  Patient presents with   Abdominal Pain    Melinda Mckinney is a 65 y.o. female.  Patient presents with right upper quadrant abdominal pain for 1 week.  She states this started right after her dog jumped on her and stepped on her in that spot a week ago.  She thought the pain would improve but it has not.  Is been persistent pain for the past week worse when she pushes on it.  Otherwise denies fevers or cough.  Denies vomiting or diarrhea.      Past Medical History:  Diagnosis Date   Anemia    Anxiety and depression    Asthma    Borderline diabetes    CAD (coronary artery disease) ~2009   stent    Cervical cancer ([place])    "stage 1; had cryotherapy"   CHF (congestive heart failure) (HCC)    Chronic lower back pain    Complete heart block ([place]) 06/2019   s/p AV nodal ablation and CRT-P by Dr Lovena Le   DDD (degenerative disc disease), lumbar    Dr. Nelva Bush, recommends lumbar epidural steroid injections L5-S1 to the right   Depression    Fibromyalgia    GERD (gastroesophageal reflux disease)    High cholesterol    History of hiatal hernia    History of kidney stones    Hypertension    Hypothyroidism    Osteoporosis    Permanent atrial fibrillation ([place]) 07/2017   s/p AV nodal ablation   Thyroid disease     Patient Active Problem List   Diagnosis Date Noted   Complete heart block ([place]) 02/05/2020   Pacemaker 02/05/2020   Permanent atrial fibrillation ([place]) 02/05/2020   Non-ischemic cardiomyopathy ([place]) 07/10/2019   Acute renal insufficiency 07/10/2019   Acute diverticulitis 07/10/2019   S/P ICD (internal cardiac defibrillator) procedure    Sepsis ([place]) 06/11/2019   Diverticulitis 06/11/2019   Pneumonia due to COVID-19 virus 05/25/2019   Hypokalemia 05/25/2019   Hypomagnesemia 05/25/2019   COVID-19 05/01/2019   Encounter for  monitoring sotalol therapy [id number]   Acute systolic (congestive) heart failure ([place]) 07/27/2017   Atrial fibrillation with RVR ([place]) 06/29/2017   PCP NOTES >>>> 06/16/2015   Back pain 02/05/2015   Hypothyroidism 01/19/2015   Vitamin D deficiency    Vulvar atrophy 08/20/2014   Annual physical exam 07/24/2014   Anemia 07/24/2014   DM2 (diabetes mellitus, type 2) ([place]) 09/27/2013   Insomnia 09/27/2013   Intertrigo 09/27/2013   Urolithiasis 09/27/2013   Essential hypertension    GERD, h/o achalasia s/p heller ~2009)    Hyperlipidemia LDL goal <70    CAD- H/O prior PCI    Osteoporosis    Fibromyalgia     Past Surgical History:  Procedure Laterality Date   AV NODE ABLATION N/A 06/24/2019   Procedure: AV NODE ABLATION;  Surgeon: Evans Lance, MD;  Location: [facility];  Service: Cardiovascular;  Laterality: N/A;   BIV PACEMAKER INSERTION CRT-P N/A 06/24/2019   Procedure: BIV PACEMAKER INSERTION CRT-P;  Surgeon: Evans Lance, MD;  Location: [facility];  Service: Cardiovascular;  Laterality: N/A;   CARDIAC CATHETERIZATION  ~ 2008   CARDIOVERSION N/A 08/05/2017   Procedure: CARDIOVERSION;  Surgeon: Evans Lance, MD;  Location: [facility];  Service: Cardiovascular;  Laterality: N/A;   CARDIOVERSION N/A 08/24/2017  Procedure: CARDIOVERSION;  Surgeon: Sanda Klein, MD;  Location: [facility];  Service: Cardiovascular;  Laterality: N/A;   CARDIOVERSION N/A 06/19/2019   Procedure: CARDIOVERSION;  Surgeon: Larey Dresser, MD;  Location: [facility];  Service: Cardiovascular;  Laterality: N/A;   CARDIOVERSION N/A 06/21/2019   Procedure: CARDIOVERSION;  Surgeon: Larey Dresser, MD;  Location: [facility];  Service: Cardiovascular;  Laterality: N/A;   [place]; ~ 1978/1979; 1984   ESOPHAGOMYOTOMY  2009   GYNECOLOGIC CRYOSURGERY  X 2   "stage 1 cancer"   HERNIA REPAIR  X 6   "all in my stomach" (07/27/2017)   KNEE ARTHROSCOPY Right    NEPHRECTOMY Right 1991    in [place], due to fibrosis and lithiasis   RIGHT/LEFT HEART CATH AND CORONARY ANGIOGRAPHY N/A 06/17/2019   Procedure: RIGHT/LEFT HEART CATH AND CORONARY ANGIOGRAPHY;  Surgeon: Lorretta Harp, MD;  Location: [facility];  Service: Cardiovascular;  Laterality: N/A;   TEE WITHOUT CARDIOVERSION N/A 06/19/2019   Procedure: TRANSESOPHAGEAL ECHOCARDIOGRAM (TEE);  Surgeon: Larey Dresser, MD;  [facility];  Service: Cardiovascular;  Laterality: N/A;   TUBAL LIGATION       OB History     Gravida  4   Para  2   Term      Preterm      AB  2   Living  2      SAB  2   IAB      Ectopic      Multiple      Live Births              Family History  Problem Relation Age of Onset   Breast cancer Other        aunt    CAD Brother    Lung cancer Mother        F and M   Diabetes Father        F and mother    CAD Father    Lung cancer Father    Stroke Sister    Colon cancer Neg Hx     Social History   Tobacco Use   Smoking status: Former    Packs/day: 2.00    Years: 28.00    Pack years: 56.00    Types: Cigarettes    Quit date: 2000    Years since quitting: 22.6   Smokeless tobacco: Never  Vaping Use   Vaping Use: Never used  Substance Use Topics   Alcohol use: Yes    Alcohol/week: 5.0 standard drinks    Types: 5 Cans of beer per week    Comment: 07/27/2017 "3-4 drinks//month"   Drug use: No    Home Medications Prior to Admission medications   Medication Sig Start Date End Date Taking? Authorizing Provider  alendronate (FOSAMAX) 70 MG tablet Take 1 tablet (70 mg total) by mouth every 7 (seven) days. Take with a full glass of water on an empty stomach. 06/18/20   Colon Branch, MD  allopurinol (ZYLOPRIM) 100 MG tablet TAKE ONE TABLET BY MOUTH DAILY 03/15/21   Colon Branch, MD  atorvastatin (LIPITOR) 40 MG tablet Take 1.5 tablets (60 mg total) by mouth at bedtime. 01/14/21   Colon Branch, MD  carvedilol (COREG) 6.25 MG tablet TAKE ONE TABLET BY MOUTH  TWICE A DAY WITH MEALS 04/27/20   Larey Dresser, MD  Cholecalciferol (VITAMIN D3) 5000 UNITS CAPS Take 5,000 Units by mouth daily.  [provider]  clobetasol cream (TEMOVATE) 0.05 %  09/05/19   [provider]  cyclobenzaprine (FLEXERIL) 10 MG tablet Take 1 tablet (10 mg total) by mouth 2 (two) times daily as needed for muscle spasms. 10/01/18   Colon Branch, MD  dapagliflozin propanediol (FARXIGA) 10 MG TABS tablet TAKE ONE TABLET BY MOUTH DAILY BEFORE BREAKFAST 02/10/21   Larey Dresser, MD  DULoxetine (CYMBALTA) 60 MG capsule Take 1 capsule (60 mg total) by mouth daily. 12/15/20   Colon Branch, MD  ferrous fumarate (HEMOCYTE - 106 MG FE) 325 (106 Fe) MG TABS tablet Take 1 tablet (106 mg of iron total) by mouth 2 (two) times daily. Do not take at the same time as omeprazole (Prilosec) 06/24/20   Colon Branch, MD  ketoconazole (NIZORAL) 2 % cream Apply 1 application topically daily. 03/18/20   Colon Branch, MD  levothyroxine (SYNTHROID) 112 MCG tablet Take 1 tablet (112 mcg total) by mouth daily before breakfast. 02/26/21   Colon Branch, MD  metFORMIN (GLUCOPHAGE) 500 MG tablet Take 1 tablet (500 mg total) by mouth 2 (two) times daily with a meal. 01/07/21   Colon Branch, MD  omeprazole (PRILOSEC) 20 MG capsule Take 1 capsule (20 mg total) by mouth 2 (two) times daily before a meal. 07/20/20   Paz, Alda Berthold, MD  sacubitril-valsartan (ENTRESTO) 49-51 MG Take 1 tablet by mouth 2 (two) times daily. 03/02/21   Larey Dresser, MD  spironolactone (ALDACTONE) 25 MG tablet Take 1 tablet (25 mg total) by mouth daily. 03/02/21   Larey Dresser, MD  torsemide (DEMADEX) 20 MG tablet Take 2 tablets (40 mg total) by mouth daily. 03/22/21   Larey Dresser, MD  XARELTO 15 MG TABS tablet TAKE ONE TABLET BY MOUTH DAILY WITH SUPPER 03/03/21   Lyda Jester M, PA-C    Allergies    Penicillins  Review of Systems   Review of Systems  Constitutional:  Negative for fever.  HENT:  Negative for ear  pain.   Eyes:  Negative for pain.  Respiratory:  Negative for cough.   Cardiovascular:  Negative for chest pain.  Gastrointestinal:  Positive for abdominal pain.  Genitourinary:  Negative for flank pain.  Musculoskeletal:  Negative for back pain.  Skin:  Negative for rash.  Neurological:  Negative for headaches.   Physical Exam Updated Vital Signs BP 102/64 (BP Location: Right Arm)   Pulse 60   Temp 98 F (36.7 C) (Oral)   Resp 16   Ht '5\' 5"'$  (1.651 m)   Wt 95.7 kg   SpO2 99%   BMI 35.11 kg/m   Physical Exam Constitutional:      General: She is not in acute distress.    Appearance: Normal appearance.  HENT:     Head: Normocephalic.     Nose: Nose normal.  Eyes:     Extraocular Movements: Extraocular movements intact.  Cardiovascular:     Rate and Rhythm: Normal rate.  Pulmonary:     Effort: Pulmonary effort is normal.  Abdominal:     Tenderness: There is abdominal tenderness.     Comments: Patient has tenderness to palpation to the right lower rib region or the right upper quadrant region.  No guarding or rebound otherwise.  Musculoskeletal:        General: Normal range of motion.     Cervical back: Normal range of motion.  Neurological:     General: No focal deficit present.  Mental Status: She is alert. Mental status is at baseline.    ED Results / Procedures / Treatments   Labs (all labs ordered are listed, but only abnormal results are displayed) Labs Reviewed  COMPREHENSIVE METABOLIC PANEL - Abnormal; Notable for the following components:      Result Value   Glucose, Bld 116 (*)    BUN 35 (*)    Creatinine, Ser 1.80 (*)    GFR, Estimated 30 (*)    All other components within normal limits  URINALYSIS, ROUTINE W REFLEX MICROSCOPIC - Abnormal; Notable for the following components:   APPearance HAZY (*)    All other components within normal limits  LIPASE, BLOOD  CBC    EKG None  Radiology CT ABDOMEN PELVIS WO CONTRAST  Result Date:  03/24/2021 CLINICAL DATA:  Acute right upper quadrant abdominal pain. EXAM: CT ABDOMEN AND PELVIS WITHOUT CONTRAST TECHNIQUE: Multidetector CT imaging of the abdomen and pelvis was performed following the standard protocol without IV contrast. COMPARISON:  June 11, 2019. FINDINGS: Lower chest: No acute abnormality. Hepatobiliary: No focal liver abnormality is seen. No gallstones, gallbladder wall thickening, or biliary dilatation. Pancreas: Unremarkable. No pancreatic ductal dilatation or surrounding inflammatory changes. Spleen: Normal in size without focal abnormality. Adrenals/Urinary Tract: Adrenal glands appear normal. Status post right nephrectomy. Left kidney and ureter are unremarkable. No hydronephrosis or renal obstruction is noted. Urinary bladder is unremarkable. Stomach/Bowel: Stomach is within normal limits. Appendix appears normal. No evidence of bowel wall thickening, distention, or inflammatory changes. Vascular/Lymphatic: No significant vascular findings are present. No enlarged abdominal or pelvic lymph nodes. Reproductive: Uterus and bilateral adnexa are unremarkable. Other: No abdominal wall hernia or abnormality. No abdominopelvic ascites. Musculoskeletal: No acute or significant osseous findings. IMPRESSION: Status post right nephrectomy. No acute abnormality seen in the abdomen or pelvis. Aortic Atherosclerosis (ICD10-I70.0). Electronically Signed   By: Marijo Conception M.D.   On: 03/24/2021 17:00    Procedures Procedures   Medications Ordered in ED Medications  oxyCODONE-acetaminophen (PERCOCET/ROXICET) 5-325 MG per tablet 1 tablet (1 tablet Oral Given 03/24/21 1106)  fentaNYL (SUBLIMAZE) injection 50 mcg (50 mcg Intravenous Given 03/24/21 1623)  sodium chloride 0.9 % bolus 1,000 mL (1,000 mLs Intravenous New Bag/Given 03/24/21 1710)    ED Course  I have reviewed the triage vital signs and the nursing notes.  Pertinent labs & imaging results that were available during my care  of the patient were reviewed by me and considered in my medical decision making (see chart for details).    MDM Rules/Calculators/A&P                           Clinically suspect rib fracture or contusion.  Labs are unremarkable.  CT imaging shows no acute findings per radiologist.  Patient given Percocet and morphine with improvement of symptoms.  Will advise outpatient follow-up with her doctor within the week.  Advised immediate return for worsening symptoms or any additional concerns.  Final Clinical Impression(s) / ED Diagnoses Final diagnoses:  Right upper quadrant abdominal pain    Rx / DC Orders ED Discharge Orders     None        Luna Fuse, MD 03/24/21 1736

## 2021-03-26 ENCOUNTER — Ambulatory Visit (INDEPENDENT_AMBULATORY_CARE_PROVIDER_SITE_OTHER): Payer: Medicare Other

## 2021-03-26 DIAGNOSIS — Z950 Presence of cardiac pacemaker: Secondary | ICD-10-CM

## 2021-03-26 DIAGNOSIS — I5022 Chronic systolic (congestive) heart failure: Secondary | ICD-10-CM

## 2021-03-26 NOTE — Progress Notes (Signed)
EPIC Encounter for ICM Monitoring  Patient Name: Melinda Mckinney is a 65 y.o. female Date: 03/26/2021 Primary Care Physican: Colon Branch, MD Primary Cardiologist: Aundra Dubin Electrophysiologist: Santina Evans Pacing: 99.0%         03/22/2021 Weight: 211 lbs (204 lbs is baseline)       Time in AT/AF  24.0 hr/day (100.0%)                                  Attempted call to husband and unable to reach.  Left detailed message per DPR regarding transmission. Transmission reviewed.    Optivol thoracic impedance suggesting fluid levels returned to normal on Torsemide 40 mg daily dosage.   Prescribed:  Torsemide 20 mg take 2 tablets (40 mg total) daily (increased on 03/22/2021).  Spironolactone 25 mg take 1 tablet daily Farxiga 10 mg take 1 tablet daily   Labs: BMET scheduled [facility]/22 at [facility] since patient has OV with Dr Rayann Heman that day. 03/02/2021 Creatinine 1.61, BUN 41, Potassium 4.3, Sodium 139 02/23/2021 Creatinine 1.84, BUN 63, Potassium 4.3, Sodium 139, GFR 28.08 11/12/2020 Creatinine 1.75, BUN 40, Potassium 4.9, Sodium 133, GFR 32 10/20/2020 Creatinine 1.76, BUN 58, Potassium 4.3, Sodium 136, GFR 32 10/01/2020 Creatinine 1.90, BUN 54, Potassium 5.0, Sodium 138, GFR 29 08/25/2020 Creatinine 1.65, BUN 52, Potassium 4.0, Sodium 140  A complete set of results can be found in Results Review.   Recommendations:  Left voice mail with ICM number and encouraged to call if experiencing any fluid symptoms.   Follow-up plan: ICM clinic phone appointment on 04/20/2021.   91 day device clinic remote transmission 06/21/2021.     EP/Cardiology Office Visits: 04/13/2021 with [facility] clinic NP/PA.  04/05/2021 with Dr Rayann Heman.     Copy of ICM check sent to Dr. Lovena Le.   3 month ICM trend: 03/26/2021.    1 Year ICM trend:       Rosalene Billings, RN 03/26/2021 11:23 AM

## 2021-03-30 IMAGING — CT CT ABD-PELV W/ CM
2 of 5 series · 15 of 46 positions shown, 17 images · IV contrast (APPLIED)
Comparison: CT 07/30/2012

CLINICAL DATA: An acute generalized abdominal pain diarrhea

EXAM:
CT ABDOMEN AND PELVIS WITH CONTRAST
TECHNIQUE: Multidetector CT imaging of the abdomen and pelvis was performed
using the standard protocol following bolus administration of
intravenous contrast.
CONTRAST:  80mL OMNIPAQUE IOHEXOL 300 MG/ML  SOLN

[Series 2: axial st · axial · 0.98mm/px · z∈[+595,+1045]mm · 12 of 103 slices shown, 14 images]
[im 7/103  soft-tissue]
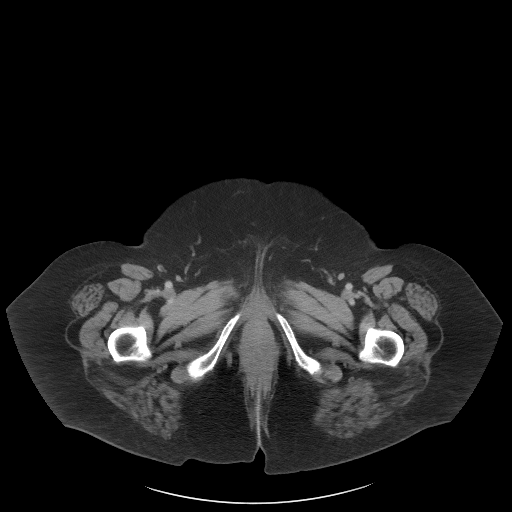
[im 7/103  bone]
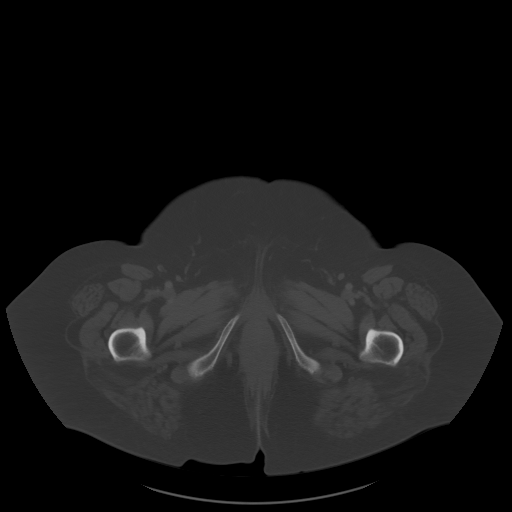
[im 19/103  soft-tissue]
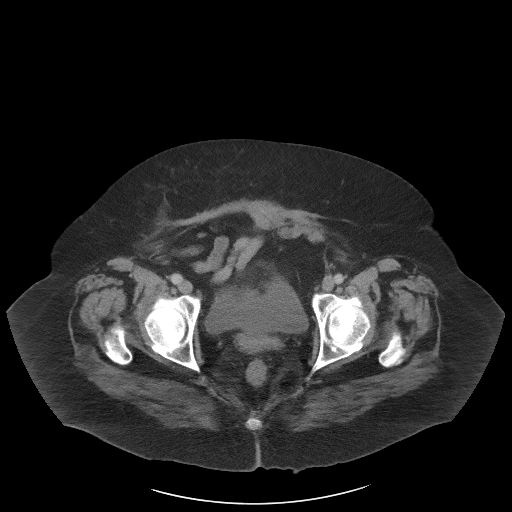
[im 25/103  soft-tissue]
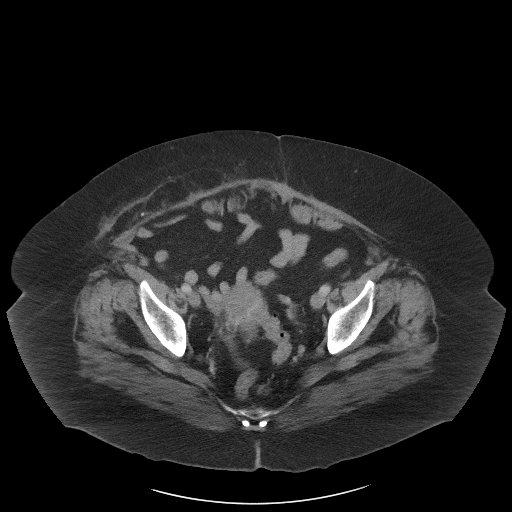
[im 31/103  soft-tissue]
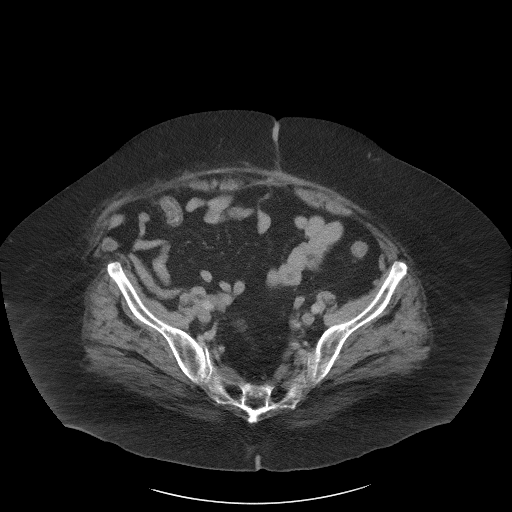
[im 43/103  soft-tissue]
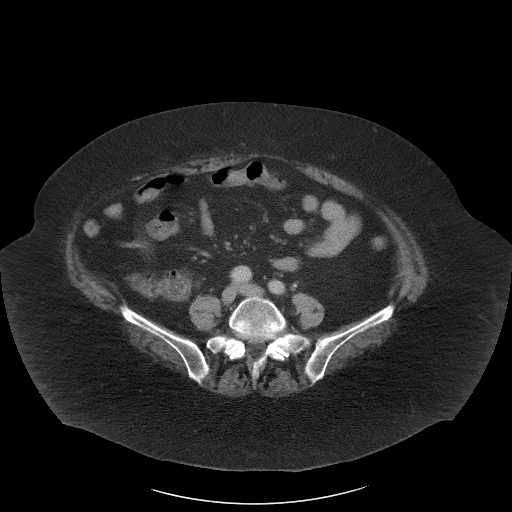
[im 49/103  soft-tissue]
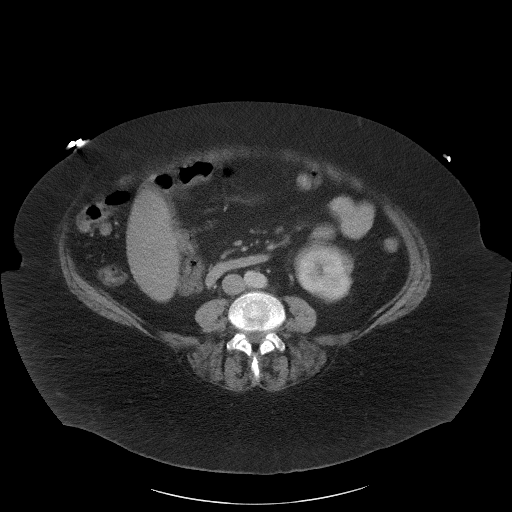
[im 55/103  soft-tissue]
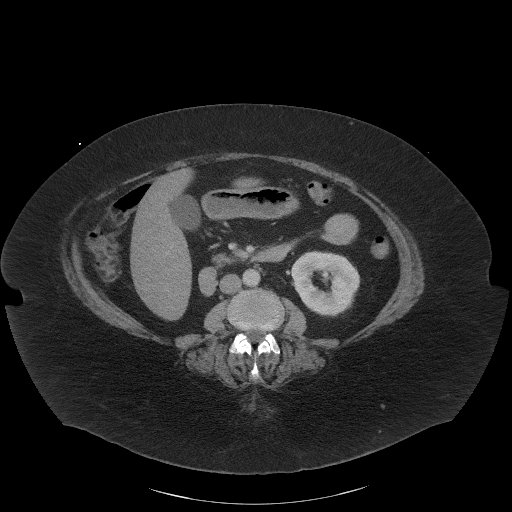
[im 67/103  soft-tissue]
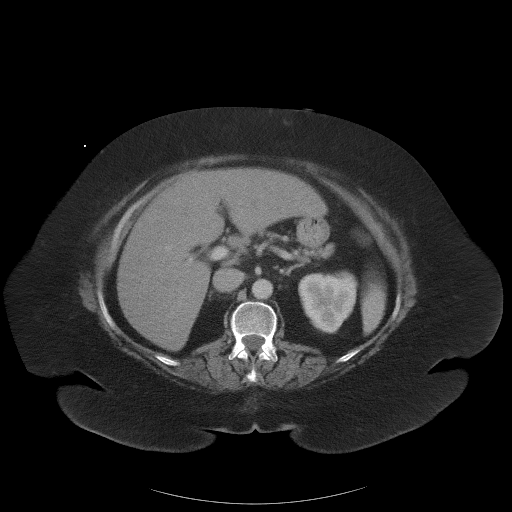
[im 73/103  soft-tissue]
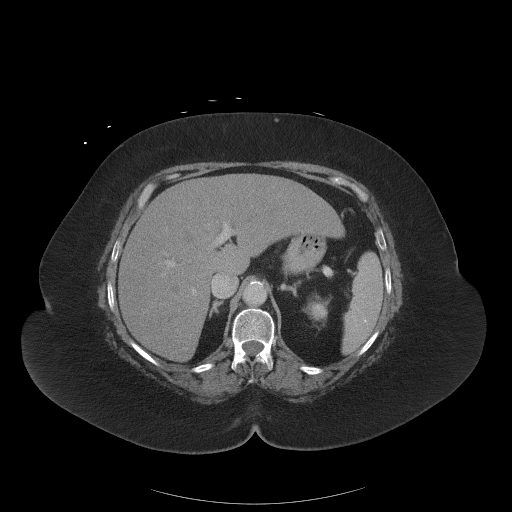
[im 73/103  bone]
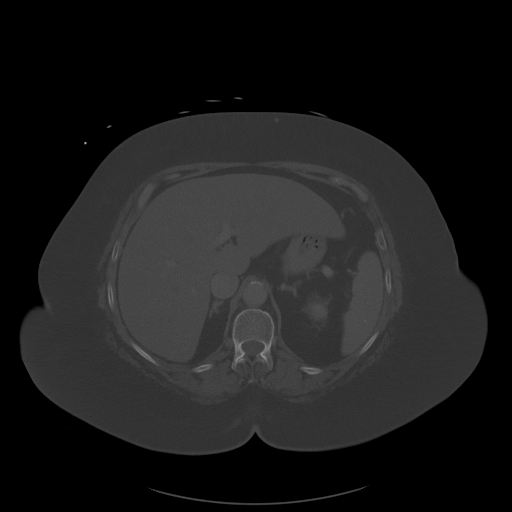
[im 79/103  soft-tissue]
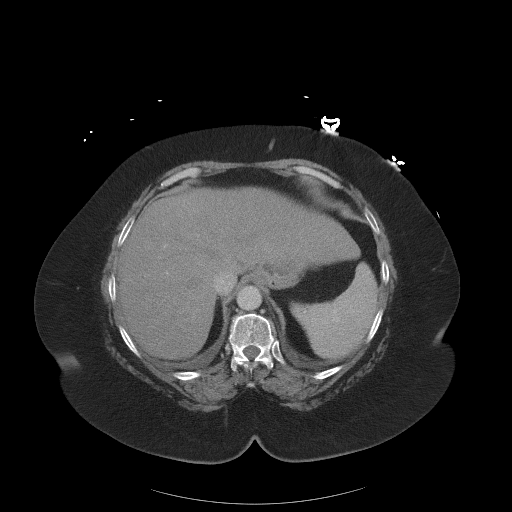
[im 91/103  soft-tissue]
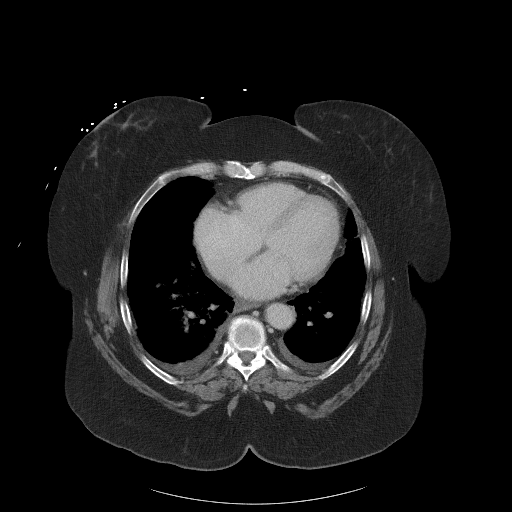
[im 97/103  soft-tissue]
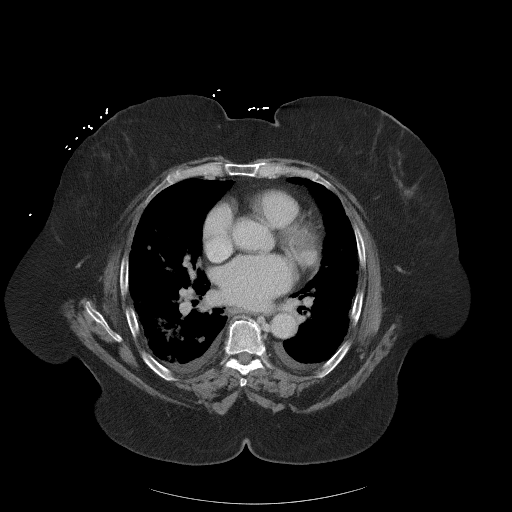

[Series 5: coronal st · coronal · 0.99mm/px · 3 of 83 slices shown]
[im 28/83  soft-tissue]
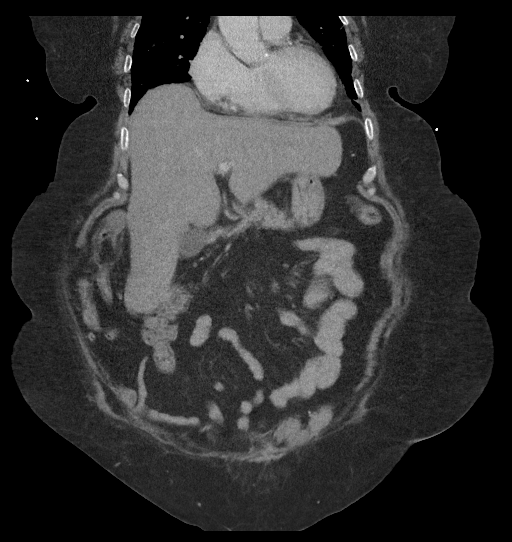
[im 37/83  soft-tissue]
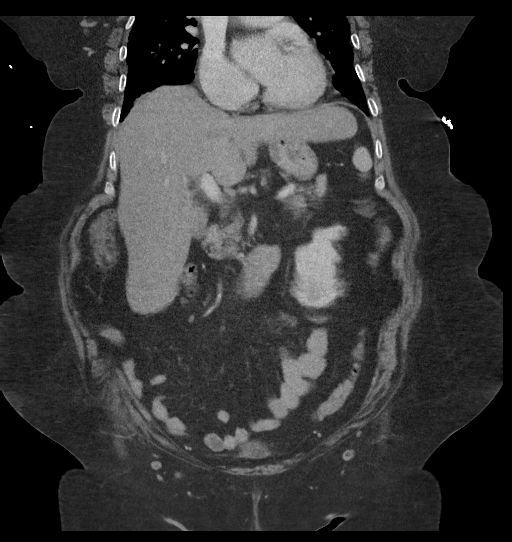
[im 46/83  soft-tissue]
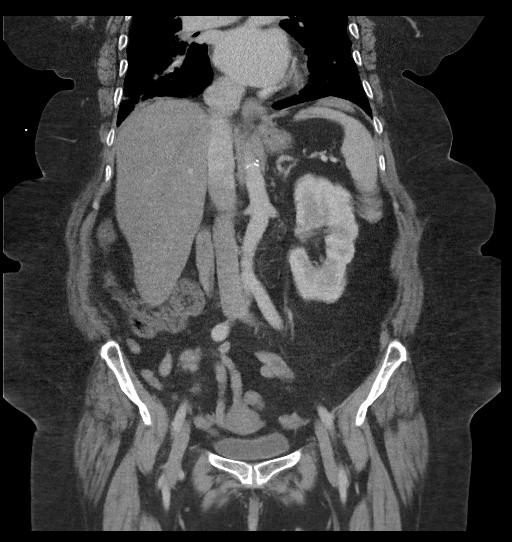

[15 of 46 positions shown; findings below may reference images not displayed]

FINDINGS: Lower chest: Multifocal airspace consolidation and surrounding
ground-glass opacity in both lungs with bilateral effusions.
Bandlike opacities likely reflect associated atelectasis and/or
scarring. Thickening and fluid-filled distal airways are noted.
Cardiomegaly with biatrial enlargement. No pericardial effusion.
Atherosclerotic calcification of the coronary arteries.

Hepatobiliary: No focal liver abnormality is seen. No gallstones,
gallbladder wall thickening, or biliary dilatation.

Pancreas: Unremarkable. No pancreatic ductal dilatation or
surrounding inflammatory changes.

Spleen: Few punctate calcifications present within the spleen.
Spleen otherwise unremarkable.

Adrenals/Urinary Tract: Normal adrenal glands. Surgical absence of
the right kidney. No abnormal soft tissue in the nephrectomy bed.
Mild lobulation of the left kidney without concerning renal mass,
calculus or hydronephrosis. No urinary tract dilatation. Urinary
bladder is unremarkable.

Stomach/Bowel: Distal esophagus, stomach and duodenal sweep are
unremarkable. No small bowel wall thickening or dilatation.
Interposition of several small bowel loops and portion of the colon
anterior to the liver. No resulting obstruction. A normal appendix
is visualized. No colonic dilatation or wall thickening.

Vascular/Lymphatic: Atherosclerotic plaque within the normal caliber
aorta. No suspicious or enlarged lymph nodes in the included
lymphatic chains.

Reproductive: Normal appearance of the uterus and adnexal
structures. Postsurgical changes from prior Caesarean section.

Other: Postsurgical changes of the low anterior abdomen, likely
related prior Caesarean section within enlarging incisional hernia
involving the right lateral aspect of the incisional line. Herniated
portion of the fat now measures approximately 13.4 x 3.3 cm in size.
Hernia defect measures approximately 3.5 by 1.4 cm transverse by
craniocaudal. No abnormal stranding within the herniated fat. No
bowel containing hernia. No free fluid or free air within the
abdomen or pelvis.

Musculoskeletal: Mild degenerative changes in the spine most
pronounced at L5-S1. No acute osseous abnormality or suspicious
osseous lesion.
IMPRESSION: 1. Multifocal airspace consolidation and surrounding ground-glass
opacity in both lungs with bilateral effusions, concerning for
multifocal pneumonia including possible atypical viral etiologies
such as [id number].
2. No acute intra-abdominal process.
3. Postsurgical changes from prior Caesarean section within
enlarging fat containing incisional hernia involving the right
lateral aspect of the incisional line. No abnormal stranding within
the herniated fat.
4. Aortic Atherosclerosis ([id number]).

## 2021-04-05 ENCOUNTER — Other Ambulatory Visit: Payer: Medicare Other

## 2021-04-05 ENCOUNTER — Other Ambulatory Visit: Payer: Self-pay

## 2021-04-05 ENCOUNTER — Ambulatory Visit (INDEPENDENT_AMBULATORY_CARE_PROVIDER_SITE_OTHER): Payer: Medicare Other | Admitting: Internal Medicine

## 2021-04-05 VITALS — BP 92/50 | HR 88 | Ht 65.0 in | Wt 213.8 lb

## 2021-04-05 DIAGNOSIS — I5022 Chronic systolic (congestive) heart failure: Secondary | ICD-10-CM

## 2021-04-05 DIAGNOSIS — Z9861 Coronary angioplasty status: Secondary | ICD-10-CM | POA: Diagnosis not present

## 2021-04-05 DIAGNOSIS — I442 Atrioventricular block, complete: Secondary | ICD-10-CM | POA: Diagnosis not present

## 2021-04-05 DIAGNOSIS — I2510 Atherosclerotic heart disease of native coronary artery without angina pectoris: Secondary | ICD-10-CM

## 2021-04-05 DIAGNOSIS — I4821 Permanent atrial fibrillation: Secondary | ICD-10-CM | POA: Diagnosis not present

## 2021-04-05 DIAGNOSIS — I428 Other cardiomyopathies: Secondary | ICD-10-CM

## 2021-04-05 NOTE — Patient Instructions (Addendum)
Medication Instructions:  Your physician recommends that you continue on your current medications as directed. Please refer to the Current Medication list given to you today.  Labwork: None ordered.  Testing/Procedures: None ordered.  Follow-Up: Your physician wants you to follow-up in: 12 months with  James Allred, MD or one of the following Advanced Practice Providers on your designated Care Team:    Renee Ursuy, PA-C    You will receive a reminder letter in the mail two months in advance. If you don't receive a letter, please call our office to schedule the follow-up appointment.   Any Other Special Instructions Will Be Listed Below (If Applicable).  If you need a refill on your cardiac medications before your next appointment, please call your pharmacy.        

## 2021-04-05 NOTE — Progress Notes (Signed)
PCP: Colon Branch, MD Primary Cardiologist: Dr Aundra Dubin Primary EP:  Dr Rayann Heman  Melinda Mckinney is a 65 y.o. female who presents today for routine electrophysiology followup.  Since last being seen in our clinic, the patient reports doing very well.  Today, she denies symptoms of palpitations, chest pain, shortness of breath,  lower extremity edema, dizziness, presyncope, or syncope.  The patient is otherwise without complaint today.   Past Medical History:  Diagnosis Date   Anemia    Anxiety and depression    Asthma    Borderline diabetes    CAD (coronary artery disease) ~2009   stent    Cervical cancer ([place])    "stage 1; had cryotherapy"   CHF (congestive heart failure) (HCC)    Chronic lower back pain    Complete heart block ([place]) 06/2019   s/p AV nodal ablation and CRT-P by Dr Lovena Le   DDD (degenerative disc disease), lumbar    Dr. Nelva Bush, recommends lumbar epidural steroid injections L5-S1 to the right   Depression    Fibromyalgia    GERD (gastroesophageal reflux disease)    High cholesterol    History of hiatal hernia    History of kidney stones    Hypertension    Hypothyroidism    Osteoporosis    Permanent atrial fibrillation ([place]) 07/2017   s/p AV nodal ablation   Thyroid disease    Past Surgical History:  Procedure Laterality Date   AV NODE ABLATION N/A 06/24/2019   Procedure: AV NODE ABLATION;  Surgeon: Evans Lance, MD;  Location: [place] [facility];  Service: Cardiovascular;  Laterality: N/A;   BIV PACEMAKER INSERTION CRT-P N/A 06/24/2019   Procedure: BIV PACEMAKER INSERTION CRT-P;  Surgeon: Evans Lance, MD;  Location: [place] [facility];  Service: Cardiovascular;  Laterality: N/A;   CARDIAC CATHETERIZATION  ~ 2008   CARDIOVERSION N/A 08/05/2017   Procedure: CARDIOVERSION;  Surgeon: Evans Lance, MD;  Location: [facility] OR;  Service: Cardiovascular;  Laterality: N/A;   CARDIOVERSION N/A 08/24/2017   Procedure: CARDIOVERSION;  Surgeon: Sanda Klein, MD;   Location: [facility] ENDOSCOPY;  Service: Cardiovascular;  Laterality: N/A;   CARDIOVERSION N/A 06/19/2019   Procedure: CARDIOVERSION;  Surgeon: Larey Dresser, MD;  Location: [facility];  Service: Cardiovascular;  Laterality: N/A;   CARDIOVERSION N/A 06/21/2019   Procedure: CARDIOVERSION;  Surgeon: Larey Dresser, MD;  Location: [facility] ENDOSCOPY;  Service: Cardiovascular;  Laterality: N/A;   [place]; ~ 1978/1979; 1984   ESOPHAGOMYOTOMY  2009   GYNECOLOGIC CRYOSURGERY  X 2   "stage 1 cancer"   HERNIA REPAIR  X 6   "all in my stomach" (07/27/2017)   KNEE ARTHROSCOPY Right    NEPHRECTOMY Right 1991   in [place], due to fibrosis and lithiasis   RIGHT/LEFT HEART CATH AND CORONARY ANGIOGRAPHY N/A 06/17/2019   Procedure: RIGHT/LEFT HEART CATH AND CORONARY ANGIOGRAPHY;  Surgeon: Lorretta Harp, MD;  Location: [place] [facility];  Service: Cardiovascular;  Laterality: N/A;   TEE WITHOUT CARDIOVERSION N/A 06/19/2019   Procedure: TRANSESOPHAGEAL ECHOCARDIOGRAM (TEE);  Surgeon: Larey Dresser, MD;  Location: [facility] OR;  Service: Cardiovascular;  Laterality: N/A;   TUBAL LIGATION      ROS- all systems are reviewed and negative except as per HPI above  Current Outpatient Medications  Medication Sig Dispense Refill   alendronate (FOSAMAX) 70 MG tablet Take 1 tablet (70 mg total) by mouth every 7 (seven) days. Take with a  full glass of water on an empty stomach. 12 tablet 3   allopurinol (ZYLOPRIM) 100 MG tablet TAKE ONE TABLET BY MOUTH DAILY 90 tablet 1   atorvastatin (LIPITOR) 40 MG tablet Take 1.5 tablets (60 mg total) by mouth at bedtime. 135 tablet 1   carvedilol (COREG) 6.25 MG tablet TAKE ONE TABLET BY MOUTH TWICE A DAY WITH MEALS 180 tablet 3   Cholecalciferol (VITAMIN D3) 5000 UNITS CAPS Take 5,000 Units by mouth daily.      clobetasol cream (TEMOVATE) 0.05 %      cyclobenzaprine (FLEXERIL) 10 MG tablet Take 1 tablet (10 mg total) by mouth 2 (two) times daily as needed for muscle spasms.  60 tablet 1   dapagliflozin propanediol (FARXIGA) 10 MG TABS tablet TAKE ONE TABLET BY MOUTH DAILY BEFORE BREAKFAST 30 tablet 4   DULoxetine (CYMBALTA) 60 MG capsule Take 1 capsule (60 mg total) by mouth daily. 90 capsule 1   ferrous fumarate (HEMOCYTE - 106 MG FE) 325 (106 Fe) MG TABS tablet Take 1 tablet (106 mg of iron total) by mouth 2 (two) times daily. Do not take at the same time as omeprazole (Prilosec) 60 tablet 6   ketoconazole (NIZORAL) 2 % cream Apply 1 application topically daily. 60 g 0   levothyroxine (SYNTHROID) 112 MCG tablet Take 1 tablet (112 mcg total) by mouth daily before breakfast. 90 tablet 0   metFORMIN (GLUCOPHAGE) 500 MG tablet Take 1 tablet (500 mg total) by mouth 2 (two) times daily with a meal. 180 tablet 1   omeprazole (PRILOSEC) 20 MG capsule Take 1 capsule (20 mg total) by mouth 2 (two) times daily before a meal. 180 capsule 3   oxyCODONE-acetaminophen (PERCOCET/ROXICET) 5-325 MG tablet Take 1 tablet by mouth every 6 (six) hours as needed for severe pain. 15 tablet 0   sacubitril-valsartan (ENTRESTO) 49-51 MG Take 1 tablet by mouth 2 (two) times daily. 60 tablet 5   spironolactone (ALDACTONE) 25 MG tablet Take 1 tablet (25 mg total) by mouth daily. 90 tablet 3   torsemide (DEMADEX) 20 MG tablet Take 2 tablets (40 mg total) by mouth daily. 180 tablet 3   XARELTO 15 MG TABS tablet TAKE ONE TABLET BY MOUTH DAILY WITH SUPPER 90 tablet 1   No current facility-administered medications for this visit.    Physical Exam: Vitals:   04/05/21 1445  BP: (!) 92/50  Pulse: 88  SpO2: 96%  Weight: 213 lb 12.8 oz (97 kg)  Height: '5\' 5"'$  (1.651 m)    GEN- The patient is well appearing, alert and oriented x 3 today.   Head- normocephalic, atraumatic Eyes-  Sclera clear, conjunctiva pink Ears- hearing intact Oropharynx- clear Lungs- Clear to ausculation bilaterally, normal work of breathing Chest- pacemaker pocket is well healed Heart- Regular rate and rhythm, no murmurs,  rubs or gallops, PMI not laterally displaced GI- soft, NT, ND, + BS Extremities- no clubbing, cyanosis, or edema  Pacemaker interrogation- reviewed in detail today,  See PACEART report  ekg tracing ordered today is personally reviewed and shows afib, V paced  Assessment and Plan:  1. Complete heart block s/p AV nodal ablation Normal biv pacemaker function See Pace Art report No changes today she is not device dependant today  2. Permanent afib S/p AV nodal ablation and CRT-P Chads2vasc score is 5.  She is on xarelto  3. Nonischemic CM EF has improved s/p AV nodal ablation with biv pacing EF by echo 3/22 55-60%  4. CAD s/p PCI  No ischemic symptoms    Return to see EP APP annually  Thompson Grayer MD, [facility] 04/05/2021 2:47 PM

## 2021-04-06 LAB — BASIC METABOLIC PANEL
BUN/Creatinine Ratio: 22 (ref 12–28)
BUN: 34 mg/dL — ABNORMAL HIGH (ref 8–27)
CO2: 25 mmol/L (ref 20–29)
Calcium: 9.3 mg/dL (ref 8.7–10.3)
Chloride: 103 mmol/L (ref 96–106)
Creatinine, Ser: 1.54 mg/dL — ABNORMAL HIGH (ref 0.57–1.00)
Glucose: 87 mg/dL (ref 65–99)
Potassium: 3.8 mmol/L (ref 3.5–5.2)
Sodium: 143 mmol/L (ref 134–144)
eGFR: 37 mL/min/{1.73_m2} — ABNORMAL LOW (ref >59)

## 2021-04-12 NOTE — Progress Notes (Addendum)
PCP: Colon Branch, MD  HF Cardiology: Dr. Aundra Dubin  65 y.o. with CAD s/p PCI in 2010, chronic combined systolic and diastolic HF (EF [id number] in 2018), atrial fibrillation on chronic anticoagulation w/ Xarelto, HTN, and hypothyroidism was diagnosed w/ COVID-19 in Sep 2020 and treated at [facility] infection c/b PNA. She was discharged and readmitted back to [facility] 10/9-10/13/20 for gastroenteritis, felt to be a sequela of COVID-19. She was managed w/ supportive care and discharged home. Had f/u with PCP 10/22 and endorsed worsening LLQ pain leading to abdominal CT which showed acute diverticulitis. She was started on outpatient antibiotics, cipro + flagyl but symptoms failed to improve, prompting her to seek emergency medical care at [facility].  Initial EKG in the ED showed afib w/ RVR for which general cardiology was consulted. She was started on IV dilt for rate control but later required treatment w/ IV amiodarone due to persistent rapid ventricular response. Hospital course c/b volume overload, requiring IV diuretics. 2D echo was obtained and showed worsening LVEF, now 25-30% w/ moderate MR. She underwent subsequent R/LHC in 11/20 which demonstrated widely patent coronaries. RHC showed elevated mean RA pressure at 22, PWP 43, LVEDP 45 and low CO. CI by Fick 1.6. She underwent TEE-guided DCCV, TEE showed EF 15% with moderate RV dysfunction and moderate MR.  She went back into rapid atrial fibrillation.  She failed additional cardioversions.  Despite amiodarone gtt, HR remained in the 140s-150s.  Finally, she underwent AV nodal ablation with BiV pacing due to inability to control atrial fibrillation. While in the hospital, she was on milrinone gtt for a period of time, but we were able to taper it off. She was discharged home after medication optimization.   Echo in 2/21 showed EF up to 40-45%, mild diffuse hypokinesis, RV low normal function.  Echo in 3/22 showed EF 55-60%, RV normal, severe  LAE.    Today she returns for HF follow up, here with her husband with interpretor. She is short of breath walking fast and is says her dizziness has improved since last visit. She has body aches and is still very fatigued since she had COVID 2 years ago. Chronically sleeps on 3 pillows. Denies CP or edema. Weight at home 213 pounds. Taking all medications. Drinking>2L water/day, eating [place] food 2x/week.   Labs (11/20): digoxin level 1.4, creatinine 1.81, K 4.8, hgb 13.9 Labs (1/21): LDL 91, K 3.7, creatinine 1.32 Labs (2/21): K 5.1, creatinine 1.37, TSH normal, digoxin 0.4 Labs (5/21): LDL 68, digoxin 0.3 Labs (6/21): K 4.4, creatinine 1.21 Labs (1/22): K 4, creatinine 1.65, TSH normal Labs (7/22): LDL 78, K 4.3, creatinine 1.84 Labs (8/22): K 3.8, creatinine 1.54  Medtronic device interrogation: OptiVol above threshold, thoracic impedence down, remains in atrial fibrillation, >99% v-pacing, daily activity ~5 hrs (personally reviewed).  PMH: 1. Hypothyroidism 2. Single kidney s/p nephrectomy 3. Atrial fibrillation: Initially diagnosed in 2018.  She was on sotalol at one point to maintain NSR.  - Uncontrollable atrial fibrillation during 11/20 admission, patient eventually had AV nodal ablation with CRT-P implantation.  4. H/o achalasia 5. GERD 6. Fibromyalgia 7. H/o diverticulitis in 10/20 8. COVID-19 in 9/20.  9. H/o cervical cancer: cryotherapy.  10. Degenerative disc disease.  11. Chronic systolic CHF: Nonischemic cardiomyopathy, possibly tachycardia-mediated in setting of uncontrolled atrial fibrillation. Medtronic CRT-P device.  - Echo (2018): EF 45-50%.  - Echo (11/20): EF 25-30%, mildly decreased RV systolic function, moderate MR.  - TEE (11/20): EF 15%,  moderately decreased RV systolic function, moderate MR.  - Cardiac MRI (11/20): EF 34%, RV EF 30%, no LGE (done after AV nodal ablation and BiV pacing).  - RHC (11/20): mean RA 22, mean PCWP 43, LVEDP 45, CI 1.6 (Fick) and  2.7 (thermodilution).  - Echo (2/21): EF 40-45%, mild diffuse hypokinesis, low normal RV systolic function.  - Echo (5/22): EF 55-60%, RV normal, severe LAE 12. CAD: PCI in 2010.  - LHC (11/20): No significant coronary disease.   Social History   Socioeconomic History   Marital status: Married    Spouse name: Not on file   Number of children: 3   Years of education: Not on file   Highest education level: Not on file  Occupational History   Occupation: stay home   Tobacco Use   Smoking status: Former    Packs/day: 2.00    Years: 28.00    Pack years: 56.00    Types: Cigarettes    Quit date: 2000    Years since quitting: 22.6   Smokeless tobacco: Never  Vaping Use   Vaping Use: Never used  Substance and Sexual Activity   Alcohol use: Yes    Alcohol/week: 5.0 standard drinks    Types: 5 Cans of beer per week    Comment: 07/27/2017 "3-4 drinks//month"   Drug use: No   Sexual activity: Not Currently    Comment: 1ST INTERCOURSE- 16, PARTNERS - 2  Other Topics Concern   Not on file  Social History Narrative   Original from [place]   3 children, 2 alive   Household --pt and husband    Social Determinants of Radio broadcast assistant Strain: Not on file  Food Insecurity: Not on file  Transportation Needs: Not on file  Physical Activity: Not on file  Stress: Not on file  Social Connections: Not on file  Intimate Partner Violence: Not on file   Family History  Problem Relation Age of Onset   Breast cancer Other        aunt    CAD Brother    Lung cancer Mother        F and M   Diabetes Father        F and mother    CAD Father    Lung cancer Father    Stroke Sister    Colon cancer Neg Hx    ROS: All systems reviewed and negative except as per HPI.   Current Outpatient Medications  Medication Sig Dispense Refill   alendronate (FOSAMAX) 70 MG tablet Take 1 tablet (70 mg total) by mouth every 7 (seven) days. Take with a full glass of water on an empty  stomach. 12 tablet 3   allopurinol (ZYLOPRIM) 100 MG tablet TAKE ONE TABLET BY MOUTH DAILY 90 tablet 1   atorvastatin (LIPITOR) 40 MG tablet Take 1.5 tablets (60 mg total) by mouth at bedtime. 135 tablet 1   carvedilol (COREG) 6.25 MG tablet TAKE ONE TABLET BY MOUTH TWICE A DAY WITH MEALS 180 tablet 3   Cholecalciferol (VITAMIN D3) 5000 UNITS CAPS Take 5,000 Units by mouth daily.      clobetasol cream (TEMOVATE) 0.05 %      cyclobenzaprine (FLEXERIL) 10 MG tablet Take 1 tablet (10 mg total) by mouth 2 (two) times daily as needed for muscle spasms. 60 tablet 1   dapagliflozin propanediol (FARXIGA) 10 MG TABS tablet TAKE ONE TABLET BY MOUTH DAILY BEFORE BREAKFAST 30 tablet 4   DULoxetine (CYMBALTA)  60 MG capsule Take 1 capsule (60 mg total) by mouth daily. 90 capsule 1   ferrous fumarate (HEMOCYTE - 106 MG FE) 325 (106 Fe) MG TABS tablet Take 1 tablet (106 mg of iron total) by mouth 2 (two) times daily. Do not take at the same time as omeprazole (Prilosec) 60 tablet 6   ketoconazole (NIZORAL) 2 % cream Apply 1 application topically daily. 60 g 0   levothyroxine (SYNTHROID) 112 MCG tablet Take 1 tablet (112 mcg total) by mouth daily before breakfast. 90 tablet 0   metFORMIN (GLUCOPHAGE) 500 MG tablet Take 1 tablet (500 mg total) by mouth 2 (two) times daily with a meal. 180 tablet 1   omeprazole (PRILOSEC) 20 MG capsule Take 1 capsule (20 mg total) by mouth 2 (two) times daily before a meal. 180 capsule 3   oxyCODONE-acetaminophen (PERCOCET/ROXICET) 5-325 MG tablet Take 1 tablet by mouth every 6 (six) hours as needed for severe pain. 15 tablet 0   sacubitril-valsartan (ENTRESTO) 49-51 MG Take 1 tablet by mouth 2 (two) times daily. 60 tablet 5   spironolactone (ALDACTONE) 25 MG tablet Take 1 tablet (25 mg total) by mouth daily. 90 tablet 3   torsemide (DEMADEX) 20 MG tablet Take 2 tablets (40 mg total) by mouth daily. 180 tablet 3   XARELTO 15 MG TABS tablet TAKE ONE TABLET BY MOUTH DAILY WITH SUPPER  90 tablet 1   No current facility-administered medications for this encounter.   Wt Readings from Last 3 Encounters:  04/13/21 96.7 kg (213 lb 2 oz)  04/05/21 97 kg (213 lb 12.8 oz)  03/24/21 95.7 kg (211 lb)   BP 108/68   Pulse 75   Wt 96.7 kg (213 lb 2 oz)   SpO2 98%   BMI 35.47 kg/m   General:  NAD. No resp difficulty HEENT: Normal Neck: Supple. JVP 6-7. Carotids 2+ bilat; no bruits. No lymphadenopathy or thryomegaly appreciated. Cor: PMI nondisplaced. Irregular rate & rhythm. No rubs, gallops or murmurs. Lungs: Clear Abdomen: Obese,  nontender, nondistended. No hepatosplenomegaly. No bruits or masses. Good bowel sounds. Extremities: No cyanosis, clubbing, rash, edema Neuro: Alert & oriented x 3, cranial nerves grossly intact. Moves all 4 extremities w/o difficulty. Affect pleasant.  Assessment/Plan: 1. Chronic systolic CHF:  Nonischemic cardiomyopathy.  Echo in 2018 with EF 45-50%.  Echo in 11/20 with EF 25-30%, diffuse hypokinesis, mildly decreased RV systolic function, moderate MR. LHC/RHC in 11/20 with no significant coronary disease, markedly elevated filling pressures and low cardiac output (CI 1.6).  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 Cardiac MRI 11/20 showed LVEF 34%, RVEF 30%, no LGE => perhaps pointing to tachy-mediated CMP as more likely etiology.  Initially required inotropic support w/ milrinone but able to titrate off.  Now s/p AV nodal ablation with MDT CRT-P.  Echo in 2/21 showed EF up to 40-45%, echo in 3/22 with EF up to 55-60%.  NYHA class II symptoms, she is volume overloaded by exam & Optivol, likely due to dietary and fluid indiscretion. - Increase torsemide to 40 mg bid x 3 days + 20 KCl, then back to 40 mg daily. BMET/BNP today; repeat BMET 10 days. - Continue Entresto 49/51 bid (recently reduced due to orthostatic symptoms). - Continue  spironolactone 25 daily.  - Continue dapagliflozin 10 mg daily.  - Continue Coreg 6.25 mg bid.   - I will ask device RN  to send transmission in 1 week to follow volume. 2. Atrial fibrillation: Persistent atrial fibrillation, it appears, at least since 9/20 when she was admitted with COVID-19. Prior, she had paroxysmal atrial fibrillation and was maintained on sotalol. She is now off sotalol.  Unable to cardiovert or rate control atrial fibrillation. Therefore, she had AV nodal ablation with BiV pacing.  - Now off amiodarone.   - Continue Xarelto 15 mg daily. No abnormal bleeding. 3. Mitral regurgitation: Functional MR, moderate on TEE.  Rate was controlled on cardiac MRI, MR was less impressive.  Minimal MR on echo in 3/22.  4. CKD stage 3b: BMET today.  5. Suspect OSA: Husband says she snores and she has daytime fatigue. - I will arrange for sleep study, she wants to do this in the hospital.   Followup with APP in 3 weeks to reassess volume, and with Dr. Aundra Dubin in 4 months.  [place] FNP 04/13/2021

## 2021-04-13 ENCOUNTER — Encounter (HOSPITAL_COMMUNITY): Payer: Self-pay

## 2021-04-13 ENCOUNTER — Other Ambulatory Visit: Payer: Self-pay

## 2021-04-13 ENCOUNTER — Ambulatory Visit (HOSPITAL_COMMUNITY)
Admission: RE | Admit: 2021-04-13 | Discharge: 2021-04-13 | Disposition: A | Discharge status: Home / Self Care | Payer: Medicare Other | Source: Ambulatory Visit | Attending: Family Medicine | Admitting: Family Medicine

## 2021-04-13 VITALS — BP 108/68 | HR 75 | Wt 213.1 lb

## 2021-04-13 DIAGNOSIS — I4821 Permanent atrial fibrillation: Secondary | ICD-10-CM | POA: Diagnosis not present

## 2021-04-13 DIAGNOSIS — N1832 Chronic kidney disease, stage 3b: Secondary | ICD-10-CM | POA: Diagnosis not present

## 2021-04-13 DIAGNOSIS — Z7984 Long term (current) use of oral hypoglycemic drugs: Secondary | ICD-10-CM | POA: Insufficient documentation

## 2021-04-13 DIAGNOSIS — I428 Other cardiomyopathies: Secondary | ICD-10-CM | POA: Insufficient documentation

## 2021-04-13 DIAGNOSIS — Z79899 Other long term (current) drug therapy: Secondary | ICD-10-CM | POA: Insufficient documentation

## 2021-04-13 DIAGNOSIS — I5022 Chronic systolic (congestive) heart failure: Secondary | ICD-10-CM | POA: Diagnosis not present

## 2021-04-13 DIAGNOSIS — I340 Nonrheumatic mitral (valve) insufficiency: Secondary | ICD-10-CM | POA: Insufficient documentation

## 2021-04-13 DIAGNOSIS — Z8249 Family history of ischemic heart disease and other diseases of the circulatory system: Secondary | ICD-10-CM | POA: Diagnosis not present

## 2021-04-13 DIAGNOSIS — Z87891 Personal history of nicotine dependence: Secondary | ICD-10-CM | POA: Diagnosis not present

## 2021-04-13 DIAGNOSIS — E039 Hypothyroidism, unspecified: Secondary | ICD-10-CM | POA: Diagnosis not present

## 2021-04-13 DIAGNOSIS — Z7901 Long term (current) use of anticoagulants: Secondary | ICD-10-CM | POA: Diagnosis not present

## 2021-04-13 DIAGNOSIS — I4819 Other persistent atrial fibrillation: Secondary | ICD-10-CM | POA: Insufficient documentation

## 2021-04-13 DIAGNOSIS — Z8616 Personal history of COVID-19: Secondary | ICD-10-CM | POA: Insufficient documentation

## 2021-04-13 DIAGNOSIS — G4719 Other hypersomnia: Secondary | ICD-10-CM

## 2021-04-13 DIAGNOSIS — I2510 Atherosclerotic heart disease of native coronary artery without angina pectoris: Secondary | ICD-10-CM | POA: Diagnosis not present

## 2021-04-13 DIAGNOSIS — Z79890 Hormone replacement therapy (postmenopausal): Secondary | ICD-10-CM | POA: Diagnosis not present

## 2021-04-13 LAB — BASIC METABOLIC PANEL
Anion gap: 7 (ref 5–15)
BUN: 27 mg/dL — ABNORMAL HIGH (ref 8–23)
CO2: 27 mmol/L (ref 22–32)
Calcium: 9.6 mg/dL (ref 8.9–10.3)
Chloride: 106 mmol/L (ref 98–111)
Creatinine, Ser: 1.59 mg/dL — ABNORMAL HIGH (ref 0.44–1.00)
GFR, Estimated: 35 mL/min — ABNORMAL LOW (ref >60)
Glucose, Bld: 108 mg/dL — ABNORMAL HIGH (ref 70–99)
Potassium: 4.7 mmol/L (ref 3.5–5.1)
Sodium: 140 mmol/L (ref 135–145)

## 2021-04-13 LAB — BRAIN NATRIURETIC PEPTIDE: B Natriuretic Peptide: 202.7 pg/mL — ABNORMAL HIGH (ref 0.0–100.0)

## 2021-04-13 MED ORDER — POTASSIUM CHLORIDE CRYS ER 20 MEQ PO TBCR: 20.0000 meq | EXTENDED_RELEASE_TABLET | Freq: Every day | ORAL | 0 refills | Status: DC

## 2021-04-13 NOTE — Progress Notes (Signed)
Remote pacemaker transmission.   

## 2021-04-13 NOTE — Patient Instructions (Addendum)
Increase Torsemide to 40 mg (2 tabs) Twice daily FOR 3 DAYS ONLY then back to 40 mg (2 tabs) Daily  Take Potassium (klor-con) 20 meq Daily FOR 3 DAYS ONLY  Labs done today, your results will be available in MyChart, we will contact you for abnormal readings.  Your physician has recommended that you have a sleep study. This test records several body functions during sleep, including: brain activity, eye movement, oxygen and carbon dioxide blood levels, heart rate and rhythm, breathing rate and rhythm, the flow of air through your mouth and nose, snoring, body muscle movements, and chest and belly movement.  Your physician recommends that you schedule a follow-up appointment in: 3 weeks and again in 4 months  If you have any questions or concerns before your next appointment please send [place] a message through [place] or call our office at [phone].    TO LEAVE A MESSAGE FOR THE NURSE SELECT OPTION 2, PLEASE LEAVE A MESSAGE INCLUDING: YOUR NAME DATE OF BIRTH CALL BACK NUMBER REASON FOR CALL**this is important as we prioritize the call backs  YOU WILL RECEIVE A CALL BACK THE SAME DAY AS LONG AS YOU CALL BEFORE 4:00 PM  At the [facility], you and your health needs are our priority. As part of our continuing mission to provide you with exceptional heart care, we have created designated Provider Care Teams. These Care Teams include your primary Cardiologist (physician) and Advanced Practice Providers (APPs- Physician Assistants and Nurse Practitioners) who all work together to provide you with the care you need, when you need it.   You may see any of the following providers on your designated Care Team at your next follow up: Dr Glori Bickers Dr [name] Dr Patrice Paradise, NP Lyda Jester, [place] Ginnie Smart Audry Riles, [name]   Please be sure to bring in all your medications bottles to every appointment.

## 2021-04-14 ENCOUNTER — Other Ambulatory Visit: Payer: Self-pay | Admitting: Internal Medicine

## 2021-04-20 ENCOUNTER — Ambulatory Visit (INDEPENDENT_AMBULATORY_CARE_PROVIDER_SITE_OTHER): Payer: Medicare Other

## 2021-04-20 DIAGNOSIS — Z950 Presence of cardiac pacemaker: Secondary | ICD-10-CM

## 2021-04-20 DIAGNOSIS — I5022 Chronic systolic (congestive) heart failure: Secondary | ICD-10-CM | POA: Diagnosis not present

## 2021-04-21 ENCOUNTER — Telehealth: Payer: Self-pay

## 2021-04-21 NOTE — Progress Notes (Signed)
EPIC Encounter for ICM Monitoring  Patient Name: Melinda Mckinney is a 65 y.o. female Date: 04/21/2021 Primary Care Physican: Colon Branch, MD Primary Cardiologist: Aundra Dubin Electrophysiologist: Santina Evans Pacing: 99.9%         03/22/2021 Weight: 211 lbs (204 lbs is baseline)       Time in AT/AF  24.0 hr/day (100.0%)                                  Spoke with husband/patient and heart failure questions reviewed.  Pt asymptomatic for fluid accumulation and feeling well.   Optivol thoracic impedance suggesting fluid levels returned to normal after Allena Katz NP at HF clinic instructed pt to take Torsemide 40 mg bid x 3 days with 20 mEq KCL.   Prescribed:  Torsemide 20 mg take 2 tablets (40 mg total) daily.  Spironolactone 25 mg take 1 tablet daily Farxiga 10 mg take 1 tablet daily   Labs: BMET scheduled 9/12 04/13/2021 Creatinine 1.59, BUN 27, Potassium 4.7, Sodium 140 04/05/2021 Creatinine 1.54, BUN 34, Potassium 3.8, Sodium 143, GFR 37  03/24/2021 Creatinine 1.80, BUN 35, Potassium 4.5, Sodium 137 03/02/2021 Creatinine 1.61, BUN 41, Potassium 4.3, Sodium 139 02/23/2021 Creatinine 1.84, BUN 63, Potassium 4.3, Sodium 139, GFR 28.08 A complete set of results can be found in Results Review.   Recommendations:  No changes and encouraged to call if experiencing any fluid symptoms.   Follow-up plan: ICM clinic phone appointment on 05/24/2021.   91 day device clinic remote transmission 06/21/2021.     EP/Cardiology Office Visits: 05/04/2021 with Advanced HF clinic NP/PA.       Copy of ICM check sent to Dr. Lovena Le and Allena Katz, NP as Juluis Rainier.      3 month ICM trend: 04/20/2021.    1 Year ICM trend:       Rosalene Billings, RN 04/21/2021 1:07 PM

## 2021-04-21 NOTE — Telephone Encounter (Signed)
Attempted call to patient/wife and the immediately returned call.

## 2021-04-26 ENCOUNTER — Ambulatory Visit (HOSPITAL_COMMUNITY)
Admission: RE | Admit: 2021-04-26 | Discharge: 2021-04-26 | Disposition: A | Discharge status: Home / Self Care | Payer: Medicare Other | Source: Ambulatory Visit | Attending: Family Medicine | Admitting: Family Medicine

## 2021-04-26 ENCOUNTER — Other Ambulatory Visit: Payer: Self-pay

## 2021-04-26 DIAGNOSIS — I5022 Chronic systolic (congestive) heart failure: Secondary | ICD-10-CM | POA: Insufficient documentation

## 2021-04-26 LAB — BASIC METABOLIC PANEL
Anion gap: 13 (ref 5–15)
BUN: 45 mg/dL — ABNORMAL HIGH (ref 8–23)
CO2: 26 mmol/L (ref 22–32)
Calcium: 9.6 mg/dL (ref 8.9–10.3)
Chloride: 93 mmol/L — ABNORMAL LOW (ref 98–111)
Creatinine, Ser: 1.96 mg/dL — ABNORMAL HIGH (ref 0.44–1.00)
GFR, Estimated: 28 mL/min — ABNORMAL LOW (ref >60)
Glucose, Bld: 99 mg/dL (ref 70–99)
Potassium: 3.9 mmol/L (ref 3.5–5.1)
Sodium: 132 mmol/L — ABNORMAL LOW (ref 135–145)

## 2021-05-01 NOTE — Progress Notes (Signed)
PCP: Colon Branch, MD  HF Cardiology: Dr. Aundra Dubin  65 y.o. with CAD s/p PCI in 2010, chronic combined systolic and diastolic HF (EF [id number] in 2018), atrial fibrillation on chronic anticoagulation w/ Xarelto, HTN, and hypothyroidism was diagnosed w/ COVID-19 in Sep 2020 and treated at [place] infection c/b PNA. She was discharged and readmitted back to [facility] 10/9-10/13/20 for gastroenteritis, felt to be a sequela of COVID-19. She was managed w/ supportive care and discharged home. Had f/u with PCP 10/22 and endorsed worsening LLQ pain leading to abdominal CT which showed acute diverticulitis. She was started on outpatient antibiotics, cipro + flagyl but symptoms failed to improve, prompting her to seek emergency medical care at [facility].  Initial EKG in the ED showed afib w/ RVR for which general cardiology was consulted. She was started on IV dilt for rate control but later required treatment w/ IV amiodarone due to persistent rapid ventricular response. Hospital course c/b volume overload, requiring IV diuretics. 2D echo was obtained and showed worsening LVEF, now 25-30% w/ moderate MR. She underwent subsequent R/LHC in 11/20 which demonstrated widely patent coronaries. RHC showed elevated mean RA pressure at 22, PWP 43, LVEDP 45 and low CO. CI by Fick 1.6. She underwent TEE-guided DCCV, TEE showed EF 15% with moderate RV dysfunction and moderate MR.  She went back into rapid atrial fibrillation.  She failed additional cardioversions.  Despite amiodarone gtt, HR remained in the 140s-150s.  Finally, she underwent AV nodal ablation with BiV pacing due to inability to control atrial fibrillation. While in the hospital, she was on milrinone gtt for a period of time, but we were able to taper it off. She was discharged home after medication optimization.   Echo in 2/21 showed EF up to 40-45%, mild diffuse hypokinesis, RV low normal function.  Echo in 3/22 showed EF 55-60%, RV normal, severe  LAE.     Today she returns for HF follow up with her husband. She was volume overloaded last visit and I instructed her to increase her diuretic for 3 days. Repeat device interrogation 1 week later showed fluid levels returned to normal. Today, she has no complaints. She is SOB with stairs or walking further distances on flat ground. She has slight dizziness with position changes. Denies CP or edema. She chronically sleeps on 3 pillows. Appetite ok. No fever or chills. Weight at home stable. Taking all medications. She has been referred   Labs (11/20): digoxin level 1.4, creatinine 1.81, K 4.8, hgb 13.9 Labs (1/21): LDL 91, K 3.7, creatinine 1.32 Labs (2/21): K 5.1, creatinine 1.37, TSH normal, digoxin 0.4 Labs (5/21): LDL 68, digoxin 0.3 Labs (6/21): K 4.4, creatinine 1.21 Labs (1/22): K 4, creatinine 1.65, TSH normal Labs (7/22): LDL 78, K 4.3, creatinine 1.84 Labs (8/22): K 3.8, creatinine 1.54 Labs (9/22): K 3.9, creatinine 1.96  Medtronic device interrogation: OptiVol down, thoracic impedence up, remains in atrial fibrillation, >99% v-pacing, daily activity ~5 hrs (personally reviewed).  PMH: 1. Hypothyroidism 2. Single kidney s/p nephrectomy 3. Atrial fibrillation: Initially diagnosed in 2018.  She was on sotalol at one point to maintain NSR.  - Uncontrollable atrial fibrillation during 11/20 admission, patient eventually had AV nodal ablation with CRT-P implantation.  4. H/o achalasia 5. GERD 6. Fibromyalgia 7. H/o diverticulitis in 10/20 8. COVID-19 in 9/20.  9. H/o cervical cancer: cryotherapy.  10. Degenerative disc disease.  11. Chronic systolic CHF: Nonischemic cardiomyopathy, possibly tachycardia-mediated in setting of uncontrolled atrial fibrillation. Medtronic  CRT-P device.  - Echo (2018): EF 45-50%.  - Echo (11/20): EF 25-30%, mildly decreased RV systolic function, moderate MR.  - TEE (11/20): EF 15%, moderately decreased RV systolic function, moderate MR.  - Cardiac  MRI (11/20): EF 34%, RV EF 30%, no LGE (done after AV nodal ablation and BiV pacing).  - RHC (11/20): mean RA 22, mean PCWP 43, LVEDP 45, CI 1.6 (Fick) and 2.7 (thermodilution).  - Echo (2/21): EF 40-45%, mild diffuse hypokinesis, low normal RV systolic function.  - Echo (5/22): EF 55-60%, RV normal, severe LAE 12. CAD: PCI in 2010.  - LHC (11/20): No significant coronary disease.   Social History   Socioeconomic History   Marital status: Married    Spouse name: Not on file   Number of children: 3   Years of education: Not on file   Highest education level: Not on file  Occupational History   Occupation: stay home   Tobacco Use   Smoking status: Former    Packs/day: 2.00    Years: 28.00    Pack years: 56.00    Types: Cigarettes    Quit date: 2000    Years since quitting: 22.7   Smokeless tobacco: Never  Vaping Use   Vaping Use: Never used  Substance and Sexual Activity   Alcohol use: Yes    Alcohol/week: 5.0 standard drinks    Types: 5 Cans of beer per week    Comment: 07/27/2017 "3-4 drinks//month"   Drug use: No   Sexual activity: Not Currently    Comment: 1ST INTERCOURSE- 16, PARTNERS - 2  Other Topics Concern   Not on file  Social History Narrative   Original from [place]   3 children, 2 alive   Household --pt and husband    Social Determinants of Radio broadcast assistant Strain: Not on file  Food Insecurity: Not on file  Transportation Needs: Not on file  Physical Activity: Not on file  Stress: Not on file  Social Connections: Not on file  Intimate Partner Violence: Not on file   Family History  Problem Relation Age of Onset   Breast cancer Other        aunt    CAD Brother    Lung cancer Mother        F and M   Diabetes Father        F and mother    CAD Father    Lung cancer Father    Stroke Sister    Colon cancer Neg Hx    ROS: All systems reviewed and negative except as per HPI.   Current Outpatient Medications  Medication Sig  Dispense Refill   alendronate (FOSAMAX) 70 MG tablet Take 1 tablet (70 mg total) by mouth every 7 (seven) days. Take with a full glass of water on an empty stomach. 12 tablet 3   allopurinol (ZYLOPRIM) 100 MG tablet TAKE ONE TABLET BY MOUTH DAILY 90 tablet 1   atorvastatin (LIPITOR) 40 MG tablet TAKE 1 AND 1/2 TABLET BY MOUTH EVERY NIGHT AT BEDTIME 135 tablet 1   carvedilol (COREG) 6.25 MG tablet TAKE ONE TABLET BY MOUTH TWICE A DAY WITH MEALS 180 tablet 3   Cholecalciferol (VITAMIN D3) 5000 UNITS CAPS Take 5,000 Units by mouth daily.      clobetasol cream (TEMOVATE) 0.05 %      cyclobenzaprine (FLEXERIL) 10 MG tablet Take 1 tablet (10 mg total) by mouth 2 (two) times daily as needed for muscle  spasms. 60 tablet 1   dapagliflozin propanediol (FARXIGA) 10 MG TABS tablet TAKE ONE TABLET BY MOUTH DAILY BEFORE BREAKFAST 30 tablet 4   DULoxetine (CYMBALTA) 60 MG capsule Take 1 capsule (60 mg total) by mouth daily. 90 capsule 1   ferrous fumarate (HEMOCYTE - 106 MG FE) 325 (106 Fe) MG TABS tablet Take 1 tablet (106 mg of iron total) by mouth 2 (two) times daily. Do not take at the same time as omeprazole (Prilosec) 60 tablet 6   ketoconazole (NIZORAL) 2 % cream Apply 1 application topically daily. 60 g 0   levothyroxine (SYNTHROID) 112 MCG tablet Take 1 tablet (112 mcg total) by mouth daily before breakfast. 90 tablet 0   metFORMIN (GLUCOPHAGE) 500 MG tablet Take 1 tablet (500 mg total) by mouth 2 (two) times daily with a meal. 180 tablet 1   omeprazole (PRILOSEC) 20 MG capsule TAKE ONE CAPSULE BY MOUTH TWICE A DAY BEFORE A MEAL 180 capsule 1   oxyCODONE-acetaminophen (PERCOCET/ROXICET) 5-325 MG tablet Take 1 tablet by mouth every 6 (six) hours as needed for severe pain. 15 tablet 0   potassium chloride SA (KLOR-CON) 20 MEQ tablet Take 1 tablet (20 mEq total) by mouth daily for 3 days. 3 tablet 0   sacubitril-valsartan (ENTRESTO) 49-51 MG Take 1 tablet by mouth 2 (two) times daily. 60 tablet 5    spironolactone (ALDACTONE) 25 MG tablet Take 1 tablet (25 mg total) by mouth daily. 90 tablet 3   torsemide (DEMADEX) 20 MG tablet Take 2 tablets (40 mg total) by mouth daily. 180 tablet 3   XARELTO 15 MG TABS tablet TAKE ONE TABLET BY MOUTH DAILY WITH SUPPER 90 tablet 1   No current facility-administered medications for this encounter.   Wt Readings from Last 3 Encounters:  05/04/21 95.7 kg (211 lb)  04/13/21 96.7 kg (213 lb 2 oz)  04/05/21 97 kg (213 lb 12.8 oz)   BP 90/60   Pulse 87   Wt 95.7 kg (211 lb)   SpO2 98%   BMI 35.11 kg/m   General:  NAD. No resp difficulty HEENT: Normal Neck: Supple. No JVD. Carotids 2+ bilat; no bruits. No lymphadenopathy or thryomegaly appreciated. Cor: PMI nondisplaced. Irregular rate & rhythm. No rubs, gallops or murmurs. Lungs: Clear Abdomen: Obese, nontender, nondistended. No hepatosplenomegaly. No bruits or masses. Good bowel sounds. Extremities: No cyanosis, clubbing, rash, edema Neuro: Alert & oriented x 3, cranial nerves grossly intact. Moves all 4 extremities w/o difficulty. Affect pleasant.  Assessment/Plan: 1. Chronic systolic CHF:  Nonischemic cardiomyopathy.  Echo in 2018 with EF 45-50%.  Echo in 11/20 with EF 25-30%, diffuse hypokinesis, mildly decreased RV systolic function, moderate MR. LHC/RHC in 11/20 with no significant coronary disease, markedly elevated filling pressures and low cardiac output (CI 1.6).  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 Cardiac MRI 11/20 showed LVEF 34%, RVEF 30%, no LGE => perhaps pointing to tachy-mediated CMP as more likely etiology.  Initially required inotropic support w/ milrinone but able to titrate off.  Now s/p AV nodal ablation with MDT CRT-P.  Echo in 2/21 showed EF up to 40-45%, Echo in 3/22 with EF up to 55-60%.  NYHA class II symptoms, she is not volume overloaded by exam &  Optivol. - With borderline BP today, I will have her hold her Entresto, spiro and carvedilol for today. She can resume these tomorrow. - Continue  Coreg 6.25 mg bid.   - Continue Entresto 49/51 bid (recently reduced due to orthostatic symptoms). - Continue spironolactone 25 daily.  - Continue dapagliflozin 10 mg daily.  - Continue torsemide 40 mg daily. BMET today. 2. Atrial fibrillation: Persistent atrial fibrillation, it appears, at least since 9/20 when she was admitted with COVID-19. Prior, she had paroxysmal atrial fibrillation and was maintained on sotalol. She is now off sotalol.  Unable to cardiovert or rate control atrial fibrillation. Therefore, she had AV nodal ablation with BiV pacing. Chads2vasc score is 5. - Now off amiodarone.   - Continue Xarelto 15 mg daily. No abnormal bleeding. 3. Mitral regurgitation: Functional MR, moderate on TEE.  Rate was controlled on cardiac MRI, MR was less impressive.  Minimal MR on echo in 3/22.  4. CKD stage 3b: BMET today. She has been referred to Nephrology by her PCP. 5. Suspect OSA: Husband says she snores and she has daytime fatigue. - Sleep study ordered, she wants to do this in the hospital.  6. Hypotension: Asymptomatic currently. Hold meds as above. I have asked her to check her BP 2-3x/week. If SBP<100, hold BP medications and notify clinic; may ultimately require lower Entresto dose.  - We discussed which medications to hold if BP low.  Followup with Dr. Aundra Dubin in 3 months.  [place] FNP 05/04/2021

## 2021-05-04 ENCOUNTER — Other Ambulatory Visit: Payer: Self-pay

## 2021-05-04 ENCOUNTER — Encounter (HOSPITAL_COMMUNITY): Payer: Self-pay

## 2021-05-04 ENCOUNTER — Ambulatory Visit (HOSPITAL_COMMUNITY)
Admission: RE | Admit: 2021-05-04 | Discharge: 2021-05-04 | Disposition: A | Discharge status: Home / Self Care | Payer: Medicare Other | Source: Ambulatory Visit | Attending: Family Medicine | Admitting: Family Medicine

## 2021-05-04 VITALS — BP 90/60 | HR 87 | Wt 211.0 lb

## 2021-05-04 DIAGNOSIS — N1832 Chronic kidney disease, stage 3b: Secondary | ICD-10-CM | POA: Diagnosis not present

## 2021-05-04 DIAGNOSIS — I340 Nonrheumatic mitral (valve) insufficiency: Secondary | ICD-10-CM | POA: Diagnosis not present

## 2021-05-04 DIAGNOSIS — I428 Other cardiomyopathies: Secondary | ICD-10-CM | POA: Insufficient documentation

## 2021-05-04 DIAGNOSIS — Z7984 Long term (current) use of oral hypoglycemic drugs: Secondary | ICD-10-CM | POA: Diagnosis not present

## 2021-05-04 DIAGNOSIS — I4821 Permanent atrial fibrillation: Secondary | ICD-10-CM

## 2021-05-04 DIAGNOSIS — I959 Hypotension, unspecified: Secondary | ICD-10-CM | POA: Diagnosis not present

## 2021-05-04 DIAGNOSIS — Z8249 Family history of ischemic heart disease and other diseases of the circulatory system: Secondary | ICD-10-CM | POA: Diagnosis not present

## 2021-05-04 DIAGNOSIS — I4819 Other persistent atrial fibrillation: Secondary | ICD-10-CM | POA: Diagnosis not present

## 2021-05-04 DIAGNOSIS — Z09 Encounter for follow-up examination after completed treatment for conditions other than malignant neoplasm: Secondary | ICD-10-CM | POA: Diagnosis not present

## 2021-05-04 DIAGNOSIS — I5022 Chronic systolic (congestive) heart failure: Secondary | ICD-10-CM | POA: Diagnosis not present

## 2021-05-04 DIAGNOSIS — R0683 Snoring: Secondary | ICD-10-CM | POA: Diagnosis not present

## 2021-05-04 DIAGNOSIS — R0602 Shortness of breath: Secondary | ICD-10-CM | POA: Insufficient documentation

## 2021-05-04 DIAGNOSIS — I2510 Atherosclerotic heart disease of native coronary artery without angina pectoris: Secondary | ICD-10-CM | POA: Insufficient documentation

## 2021-05-04 DIAGNOSIS — Z7901 Long term (current) use of anticoagulants: Secondary | ICD-10-CM | POA: Diagnosis not present

## 2021-05-04 DIAGNOSIS — Z79899 Other long term (current) drug therapy: Secondary | ICD-10-CM | POA: Insufficient documentation

## 2021-05-04 DIAGNOSIS — Z87891 Personal history of nicotine dependence: Secondary | ICD-10-CM | POA: Diagnosis not present

## 2021-05-04 DIAGNOSIS — E039 Hypothyroidism, unspecified: Secondary | ICD-10-CM | POA: Diagnosis not present

## 2021-05-04 DIAGNOSIS — Z8616 Personal history of COVID-19: Secondary | ICD-10-CM | POA: Insufficient documentation

## 2021-05-04 DIAGNOSIS — R42 Dizziness and giddiness: Secondary | ICD-10-CM | POA: Insufficient documentation

## 2021-05-04 LAB — BASIC METABOLIC PANEL
Anion gap: 12 (ref 5–15)
BUN: 45 mg/dL — ABNORMAL HIGH (ref 8–23)
CO2: 21 mmol/L — ABNORMAL LOW (ref 22–32)
Calcium: 9.5 mg/dL (ref 8.9–10.3)
Chloride: 98 mmol/L (ref 98–111)
Creatinine, Ser: 1.87 mg/dL — ABNORMAL HIGH (ref 0.44–1.00)
GFR, Estimated: 29 mL/min — ABNORMAL LOW (ref >60)
Glucose, Bld: 97 mg/dL (ref 70–99)
Potassium: 4.5 mmol/L (ref 3.5–5.1)
Sodium: 131 mmol/L — ABNORMAL LOW (ref 135–145)

## 2021-05-04 NOTE — Patient Instructions (Addendum)
Labs were done today, if any labs are abnormal the clinic will call you  HOLD Entresto, Carvedilol and Spironolactone TODAY ONLY  Please check your Blood pressure 2-3 times weekly If you systolic (top) number is less than 100 please notify the clinic [phone]  At the [facility], you and your health needs are our priority. As part of our continuing mission to provide you with exceptional heart care, we have created designated Provider Care Teams. These Care Teams include your primary Cardiologist (physician) and Advanced Practice Providers (APPs- Physician Assistants and Nurse Practitioners) who all work together to provide you with the care you need, when you need it.   You may see any of the following providers on your designated Care Team at your next follow up: Dr Glori Bickers Dr [name] Dr Patrice Paradise, NP Lyda Jester, [place] Ginnie Smart Audry Riles, [name]   Please be sure to bring in all your medications bottles to every appointment.    If you have any questions or concerns before your next appointment please send [place] a message through [place] or call our office at [phone].    TO LEAVE A MESSAGE FOR THE NURSE SELECT OPTION 2, PLEASE LEAVE A MESSAGE INCLUDING: YOUR NAME DATE OF BIRTH CALL BACK NUMBER REASON FOR CALL**this is important as we prioritize the call backs  YOU WILL RECEIVE A CALL BACK THE SAME DAY AS LONG AS YOU CALL BEFORE 4:00 PM

## 2021-05-05 DIAGNOSIS — N2581 Secondary hyperparathyroidism of renal origin: Secondary | ICD-10-CM | POA: Diagnosis not present

## 2021-05-05 DIAGNOSIS — I959 Hypotension, unspecified: Secondary | ICD-10-CM | POA: Diagnosis not present

## 2021-05-05 DIAGNOSIS — N1832 Chronic kidney disease, stage 3b: Secondary | ICD-10-CM | POA: Diagnosis not present

## 2021-05-05 DIAGNOSIS — Z905 Acquired absence of kidney: Secondary | ICD-10-CM | POA: Diagnosis not present

## 2021-05-05 DIAGNOSIS — D631 Anemia in chronic kidney disease: Secondary | ICD-10-CM | POA: Diagnosis not present

## 2021-05-05 LAB — BASIC METABOLIC PANEL
BUN: 48 — AB (ref 4–21)
CO2: 26 — AB (ref 13–22)
Chloride: 103 (ref 99–108)
Creatinine: 1.8 — AB (ref 0.5–1.1)
Glucose: 120
Potassium: 4.7 (ref 3.4–5.3)
Sodium: 139 (ref 137–147)

## 2021-05-05 LAB — MICROALBUMIN, URINE: Microalb, Ur: 5.5

## 2021-05-05 LAB — CBC: RBC: 3.85 — AB (ref 3.87–5.11)

## 2021-05-05 LAB — CBC AND DIFFERENTIAL
HCT: 37 (ref 36–46)
Hemoglobin: 12.1 (ref 12.0–16.0)
Neutrophils Absolute: 3.6
Platelets: 347 (ref 150–399)
WBC: 6

## 2021-05-05 LAB — COMPREHENSIVE METABOLIC PANEL
Albumin: 4.3 (ref 3.5–5.0)
Calcium: 10.2 (ref 8.7–10.7)
GFR calc non Af Amer: 32

## 2021-05-09 ENCOUNTER — Other Ambulatory Visit (HOSPITAL_COMMUNITY): Payer: Self-pay | Admitting: Cardiology

## 2021-05-09 ENCOUNTER — Other Ambulatory Visit: Payer: Self-pay | Admitting: Internal Medicine

## 2021-05-11 ENCOUNTER — Other Ambulatory Visit: Payer: Self-pay | Admitting: Nephrology

## 2021-05-11 DIAGNOSIS — N1832 Chronic kidney disease, stage 3b: Secondary | ICD-10-CM

## 2021-05-16 ENCOUNTER — Encounter: Payer: Self-pay | Admitting: Internal Medicine

## 2021-05-18 ENCOUNTER — Ambulatory Visit
Admission: RE | Admit: 2021-05-18 | Discharge: 2021-05-18 | Disposition: A | Discharge status: Home / Self Care | Payer: Medicare Other | Source: Ambulatory Visit | Attending: Nephrology | Admitting: Nephrology

## 2021-05-18 DIAGNOSIS — N183 Chronic kidney disease, stage 3 unspecified: Secondary | ICD-10-CM | POA: Diagnosis not present

## 2021-05-18 DIAGNOSIS — N1832 Chronic kidney disease, stage 3b: Secondary | ICD-10-CM

## 2021-05-24 ENCOUNTER — Ambulatory Visit (INDEPENDENT_AMBULATORY_CARE_PROVIDER_SITE_OTHER): Payer: Medicare Other

## 2021-05-24 DIAGNOSIS — I5022 Chronic systolic (congestive) heart failure: Secondary | ICD-10-CM

## 2021-05-24 DIAGNOSIS — Z950 Presence of cardiac pacemaker: Secondary | ICD-10-CM

## 2021-05-25 ENCOUNTER — Telehealth: Payer: Self-pay

## 2021-05-25 NOTE — Telephone Encounter (Signed)
Remote ICM transmission received.  Attempted call to husband regarding ICM remote transmission and left detailed message per DPR.  Advised to return call for any fluid symptoms or questions. Next ICM remote transmission scheduled 06/28/2021.

## 2021-05-25 NOTE — Progress Notes (Signed)
EPIC Encounter for ICM Monitoring  Patient Name: Melinda Mckinney is a 65 y.o. female Date: 05/25/2021 Primary Care Physican: Colon Branch, MD Primary Cardiologist: Aundra Dubin Electrophysiologist: Santina Evans Pacing: 98.8%         03/22/2021 Weight: 211 lbs (204 lbs is baseline)       Time in AT/AF  24.0 hr/day (100.0%)                                  Attempted call to husband and unable to reach.  Left detailed message per DPR regarding transmission. Transmission reviewed.    Optivol thoracic impedance suggesting normal fluid levels   Prescribed:  Torsemide 20 mg take 2 tablets (40 mg total) daily.  Spironolactone 25 mg take 1 tablet daily Farxiga 10 mg take 1 tablet daily   Labs:  05/04/2021 Creatinine 1.87, BUN 45, Potassium 4.5, Sodium 131, GFR 29 04/26/2021 Creatinine 1.96, BUN 45, Potassium 3.9, Sodium 132, GFR 28 04/13/2021 Creatinine 1.59, BUN 27, Potassium 4.7, Sodium 140 04/05/2021 Creatinine 1.54, BUN 34, Potassium 3.8, Sodium 143, GFR 37  03/24/2021 Creatinine 1.80, BUN 35, Potassium 4.5, Sodium 137 03/02/2021 Creatinine 1.61, BUN 41, Potassium 4.3, Sodium 139 02/23/2021 Creatinine 1.84, BUN 63, Potassium 4.3, Sodium 139, GFR 28.08 A complete set of results can be found in Results Review.   Recommendations:  Left voice mail with ICM number and encouraged to call if experiencing any fluid symptoms.   Follow-up plan: ICM clinic phone appointment on 06/28/2021.   91 day device clinic remote transmission 06/21/2021.     EP/Cardiology Office Visits:  08/12/2021 with Advanced HF clinic NP/PA.       Copy of ICM check sent to Dr. Lovena Le.   3 month ICM trend: 05/24/2021.    1 Year ICM trend:       Rosalene Billings, RN 05/25/2021 2:02 PM

## 2021-05-31 ENCOUNTER — Encounter: Payer: Self-pay | Admitting: Internal Medicine

## 2021-06-04 ENCOUNTER — Encounter (HOSPITAL_BASED_OUTPATIENT_CLINIC_OR_DEPARTMENT_OTHER): Payer: Medicare Other | Admitting: Cardiology

## 2021-06-08 ENCOUNTER — Other Ambulatory Visit: Payer: Self-pay | Admitting: Internal Medicine

## 2021-06-21 ENCOUNTER — Ambulatory Visit (INDEPENDENT_AMBULATORY_CARE_PROVIDER_SITE_OTHER): Payer: Medicare Other

## 2021-06-21 DIAGNOSIS — I428 Other cardiomyopathies: Secondary | ICD-10-CM | POA: Diagnosis not present

## 2021-06-21 LAB — CUP PACEART REMOTE DEVICE CHECK
Battery Remaining Longevity: 98 mo
Battery Voltage: 2.98 V
Brady Statistic RA Percent Paced: 0 %
Brady Statistic RV Percent Paced: 99.57 %
Date Time Interrogation Session: 20221107061450
Implantable Lead Implant Date: 20201109
Implantable Lead Implant Date: 20201109
Implantable Lead Implant Date: 20201109
Implantable Lead Location: 753858
Implantable Lead Location: 753859
Implantable Lead Location: 753860
Implantable Lead Model: 4598
Implantable Lead Model: 5076
Implantable Lead Model: 5076
Implantable Pulse Generator Implant Date: 20201109
Lead Channel Impedance Value: 1007 Ohm
Lead Channel Impedance Value: 1045 Ohm
Lead Channel Impedance Value: 1064 Ohm
Lead Channel Impedance Value: 1235 Ohm
Lead Channel Impedance Value: 1311 Ohm
Lead Channel Impedance Value: 399 Ohm
Lead Channel Impedance Value: 437 Ohm
Lead Channel Impedance Value: 475 Ohm
Lead Channel Impedance Value: 494 Ohm
Lead Channel Impedance Value: 494 Ohm
Lead Channel Impedance Value: 684 Ohm
Lead Channel Impedance Value: 722 Ohm
Lead Channel Impedance Value: 722 Ohm
Lead Channel Impedance Value: 836 Ohm
Lead Channel Pacing Threshold Amplitude: 0.375 V
Lead Channel Pacing Threshold Amplitude: 1.125 V
Lead Channel Pacing Threshold Pulse Width: 0.4 ms
Lead Channel Pacing Threshold Pulse Width: 0.8 ms
Lead Channel Sensing Intrinsic Amplitude: 1.125 mV
Lead Channel Sensing Intrinsic Amplitude: 1.125 mV
Lead Channel Sensing Intrinsic Amplitude: 12.625 mV
Lead Channel Sensing Intrinsic Amplitude: 12.625 mV
Lead Channel Setting Pacing Amplitude: 1.75 V
Lead Channel Setting Pacing Amplitude: 2.5 V
Lead Channel Setting Pacing Pulse Width: 0.4 ms
Lead Channel Setting Pacing Pulse Width: 0.8 ms
Lead Channel Setting Sensing Sensitivity: 2 mV

## 2021-06-22 ENCOUNTER — Ambulatory Visit (INDEPENDENT_AMBULATORY_CARE_PROVIDER_SITE_OTHER): Payer: Medicare Other | Admitting: Internal Medicine

## 2021-06-22 ENCOUNTER — Encounter: Payer: Self-pay | Admitting: Internal Medicine

## 2021-06-22 ENCOUNTER — Other Ambulatory Visit: Payer: Self-pay

## 2021-06-22 VITALS — BP 83/54 | HR 69 | Temp 97.8°F | Resp 12 | Ht 65.0 in | Wt 208.0 lb

## 2021-06-22 DIAGNOSIS — I428 Other cardiomyopathies: Secondary | ICD-10-CM | POA: Diagnosis not present

## 2021-06-22 DIAGNOSIS — E1159 Type 2 diabetes mellitus with other circulatory complications: Secondary | ICD-10-CM

## 2021-06-22 DIAGNOSIS — Z23 Encounter for immunization: Secondary | ICD-10-CM

## 2021-06-22 DIAGNOSIS — E039 Hypothyroidism, unspecified: Secondary | ICD-10-CM

## 2021-06-22 LAB — TSH: TSH: 0.72 u[IU]/mL (ref 0.35–5.50)

## 2021-06-22 NOTE — Progress Notes (Signed)
Subjective:    Patient ID: Melinda Mckinney, female    DOB: [birth year], 65 y.o.   MRN: [id number]  DOS:  06/22/2021 Type of visit - description: f/u  Since the last office visit she feels well.  At baseline. Chronic fatigue, aches and pains unchanged. BPs noted to be low, she does not think that is causing any problems or symptoms.   Review of Systems See above   Past Medical History:  Diagnosis Date   Anemia    Anxiety and depression    Asthma    Borderline diabetes    CAD (coronary artery disease) ~2009   stent    Cervical cancer ([place])    "stage 1; had cryotherapy"   CHF (congestive heart failure) (HCC)    Chronic lower back pain    Complete heart block ([place]) 06/2019   s/p AV nodal ablation and CRT-P by Dr Lovena Le   DDD (degenerative disc disease), lumbar    Dr. Nelva Bush, recommends lumbar epidural steroid injections L5-S1 to the right   Depression    Fibromyalgia    GERD (gastroesophageal reflux disease)    High cholesterol    History of hiatal hernia    History of kidney stones    Hypertension    Hypothyroidism    Osteoporosis    Permanent atrial fibrillation ([place]) 07/2017   s/p AV nodal ablation   Thyroid disease     Past Surgical History:  Procedure Laterality Date   AV NODE ABLATION N/A 06/24/2019   Procedure: AV NODE ABLATION;  Surgeon: Evans Lance, MD;  Location: [facility];  Service: Cardiovascular;  Laterality: N/A;   BIV PACEMAKER INSERTION CRT-P N/A 06/24/2019   Procedure: BIV PACEMAKER INSERTION CRT-P;  Surgeon: Evans Lance, MD;  Location: [facility];  Service: Cardiovascular;  Laterality: N/A;   CARDIAC CATHETERIZATION  ~ 2008   CARDIOVERSION N/A 08/05/2017   Procedure: CARDIOVERSION;  Surgeon: Evans Lance, MD;  Location: [facility] OR;  Service: Cardiovascular;  Laterality: N/A;   CARDIOVERSION N/A 08/24/2017   Procedure: CARDIOVERSION;  Surgeon: Sanda Klein, MD;  Location: [facility] ENDOSCOPY;  Service: Cardiovascular;  Laterality: N/A;    CARDIOVERSION N/A 06/19/2019   Procedure: CARDIOVERSION;  Surgeon: Larey Dresser, MD;  Location: [place];  Service: Cardiovascular;  Laterality: N/A;   CARDIOVERSION N/A 06/21/2019   Procedure: CARDIOVERSION;  Surgeon: Larey Dresser, MD;  Location: [facility] ENDOSCOPY;  Service: Cardiovascular;  Laterality: N/A;   [place]; ~ 1978/1979; 1984   ESOPHAGOMYOTOMY  2009   GYNECOLOGIC CRYOSURGERY  X 2   "stage 1 cancer"   HERNIA REPAIR  X 6   "all in my stomach" (07/27/2017)   KNEE ARTHROSCOPY Right    NEPHRECTOMY Right 1991   in [place], due to fibrosis and lithiasis   RIGHT/LEFT HEART CATH AND CORONARY ANGIOGRAPHY N/A 06/17/2019   Procedure: RIGHT/LEFT HEART CATH AND CORONARY ANGIOGRAPHY;  Surgeon: Lorretta Harp, MD;  Location: [place] [facility];  Service: Cardiovascular;  Laterality: N/A;   TEE WITHOUT CARDIOVERSION N/A 06/19/2019   Procedure: TRANSESOPHAGEAL ECHOCARDIOGRAM (TEE);  Surgeon: Larey Dresser, MD;  Location: [facility] OR;  Service: Cardiovascular;  Laterality: N/A;   TUBAL LIGATION      Allergies as of 06/22/2021       Reactions   Penicillins Swelling, Rash   Has patient had a PCN reaction causing immediate rash, facial/tongue/throat swelling, SOB or lightheadedness with hypotension: No Has patient had a PCN reaction causing severe  rash involving mucus membranes or skin necrosis: No Has patient had a PCN reaction that required hospitalization: No Has patient had a PCN reaction occurring within the last 10 years: No If all of the above answers are "NO", then may proceed with Cephalosporin use.        Medication List        Accurate as of June 22, 2021  7:11 PM. If you have any questions, ask your nurse or doctor.          alendronate 70 MG tablet Commonly known as: FOSAMAX Take 1 tablet (70 mg total) by mouth every 7 (seven) days. Take with a full glass of water on an empty stomach.   allopurinol 100 MG tablet Commonly known as: ZYLOPRIM TAKE  ONE TABLET BY MOUTH DAILY   atorvastatin 40 MG tablet Commonly known as: LIPITOR TAKE 1 AND 1/2 TABLET BY MOUTH EVERY NIGHT AT BEDTIME   carvedilol 6.25 MG tablet Commonly known as: COREG TAKE ONE TABLET BY MOUTH TWICE A DAY WITH MEALS   clobetasol cream 0.05 % Commonly known as: TEMOVATE   cyclobenzaprine 10 MG tablet Commonly known as: FLEXERIL Take 1 tablet (10 mg total) by mouth 2 (two) times daily as needed for muscle spasms.   dapagliflozin propanediol 10 MG Tabs tablet Commonly known as: Farxiga TAKE ONE TABLET BY MOUTH DAILY BEFORE BREAKFAST   DULoxetine 60 MG capsule Commonly known as: CYMBALTA TAKE ONE CAPSULE BY MOUTH DAILY   Entresto 49-51 MG Generic drug: sacubitril-valsartan Take 1 tablet by mouth 2 (two) times daily.   ferrous fumarate 325 (106 Fe) MG Tabs tablet Commonly known as: HEMOCYTE - 106 mg FE Take 1 tablet (106 mg of iron total) by mouth 2 (two) times daily. Do not take at the same time as omeprazole (Prilosec)   ketoconazole 2 % cream Commonly known as: NIZORAL Apply 1 application topically daily.   levothyroxine 112 MCG tablet Commonly known as: SYNTHROID TAKE ONE TABLET BY MOUTH DAILY BEFORE BREAKFAST   metFORMIN 500 MG tablet Commonly known as: GLUCOPHAGE Take 1 tablet (500 mg total) by mouth 2 (two) times daily with a meal.   omeprazole 20 MG capsule Commonly known as: PRILOSEC TAKE ONE CAPSULE BY MOUTH TWICE A DAY BEFORE A MEAL   oxyCODONE-acetaminophen 5-325 MG tablet Commonly known as: PERCOCET/ROXICET Take 1 tablet by mouth every 6 (six) hours as needed for severe pain.   potassium chloride SA 20 MEQ tablet Commonly known as: KLOR-CON Take 1 tablet (20 mEq total) by mouth daily for 3 days.   spironolactone 25 MG tablet Commonly known as: ALDACTONE Take 1 tablet (25 mg total) by mouth daily.   torsemide 20 MG tablet Commonly known as: DEMADEX Take 2 tablets (40 mg total) by mouth daily.   Vitamin D3 125 MCG (5000 UT)  Caps Take 5,000 Units by mouth daily.   Xarelto 15 MG Tabs tablet Generic drug: Rivaroxaban TAKE ONE TABLET BY MOUTH DAILY WITH SUPPER           Objective:   Physical Exam BP (!) 83/54 (BP Location: Left Arm, Cuff Size: Large)   Pulse 69   Temp 97.8 F (36.6 C) (Oral)   Resp 12   Ht 5\' 5"  (1.651 m)   Wt 208 lb (94.3 kg)   SpO2 99%   BMI 34.61 kg/m  General:   Well developed, NAD, BMI noted. HEENT:  Normocephalic . Face symmetric, atraumatic Lungs:  CTA B Normal respiratory effort, no intercostal retractions, no  accessory muscle use. Heart: RRR,  no murmur.  Lower extremities: no pretibial edema bilaterally  Skin: Not pale. Not jaundice Neurologic:  alert & oriented X3.  Speech normal, gait appropriate for age and unassisted Psych--  Cognition and judgment appear intact.  Cooperative with normal attention span and concentration.  Behavior appropriate. No anxious or depressed appearing.      Assessment     Assessment   DM (A1c 6.7 03-2020) HTN High cholesterol Hypothyroidism Anxiety depression, insomnia CV: --CAD: stent ~ 2009 --Atrial fibrillation DX 06-2017  --06-2019 A. fib, RVR, failed cardioversion,, cath: Normal coronaries, had AV nodal ablation, ICD implanted  --Non ischemic cardiomyopathy, DX 06-2019, tachycardia related? Obesity  Vitamin D deficiency, saw Dr. Cruzita Lederer 07-2016, rtc 1 year, celiac dz test (-), unlikely  Malabsortion; cont supplements  GERD H/o achalasia, s/p [place] 2009 SINGLE KIDNEY---S/p R  Nephrectomy ([place] 1991 due to stones/fibrosis) MSK: --Fibromyalgia --DJD  Knee pain R --Back pain -- s/p epidural 10-16 Osteoporosis: Per Endo as of 10-2020 08-2014 T score -2.8, 09-2015: T score -2.7.- GYN: h/o cervical dysplasia Derm: vulvar pruritus, temovate prn ok per gyn  PLAN: Available labs reviewed: 05/05/2021: Potassium 4.7, creatinine 1.8.  Hemoglobin 12.1. DM: At the last visit 02-2021, A1c was 6.4.  Continue present  care. HTN: In the low side, chronically, she feels fatigue-tired but that is a chronic issue  and likely related to fibromyalgia.  No change Nonischemic cardiomyopathy: Saw cardiology 05/04/2021, at the time BP was 90/60, they rec do hold BP meds for 24 hours but then continue carvedilol, Entresto , spironolactone, dapagliflozin, torsemide. Hypothyroidism: Last TSH is slightly suppressed, Synthroid adjusted, currently on 112 mcg, check labs CKD: Saw nephrology per patient, has a follow-up with them.  No records available today. Anemia, Achalasia, colon polyps: Ferritin and CBC 02-2021 ok GERD: See last visit, sxs better  with PPI twice daily.  . Preventive care flu shot today, recommend COVID booster RTC CPX 10-2021       This visit occurred during the SARS-CoV-2 public health emergency.  Safety protocols were in place, including screening questions prior to the visit, additional usage of staff PPE, and extensive cleaning of exam room while observing appropriate contact time as indicated for disinfecting solutions.

## 2021-06-22 NOTE — Patient Instructions (Signed)
Proceed with a COVID booster  GO TO THE LAB : Get the blood work     [place], [place] Come back for a physical exam by 10-2021

## 2021-06-22 NOTE — Assessment & Plan Note (Signed)
Available labs reviewed: 05/05/2021: Potassium 4.7, creatinine 1.8.  Hemoglobin 12.1. DM: At the last visit 02-2021, A1c was 6.4.  Continue present care. HTN: In the low side, chronically, she feels fatigue-tired but that is a chronic issue  and likely related to fibromyalgia.  No change Nonischemic cardiomyopathy: Saw cardiology 05/04/2021, at the time BP was 90/60, they rec do hold BP meds for 24 hours but then continue carvedilol, Entresto , spironolactone, dapagliflozin, torsemide. Hypothyroidism: Last TSH is slightly suppressed, Synthroid adjusted, currently on 112 mcg, check labs CKD: Saw nephrology per patient, has a follow-up with them.  No records available today. Anemia, Achalasia, colon polyps: Ferritin and CBC 02-2021 ok GERD: See last visit, sxs better  with PPI twice daily.  . Preventive care flu shot today, recommend COVID booster RTC CPX 10-2021

## 2021-06-24 NOTE — Progress Notes (Signed)
Remote pacemaker transmission.   

## 2021-06-28 ENCOUNTER — Ambulatory Visit (INDEPENDENT_AMBULATORY_CARE_PROVIDER_SITE_OTHER): Payer: Medicare Other

## 2021-06-28 ENCOUNTER — Ambulatory Visit (HOSPITAL_BASED_OUTPATIENT_CLINIC_OR_DEPARTMENT_OTHER): Payer: Medicare Other | Attending: Family Medicine | Admitting: Cardiology

## 2021-06-28 DIAGNOSIS — I5022 Chronic systolic (congestive) heart failure: Secondary | ICD-10-CM | POA: Diagnosis not present

## 2021-06-28 DIAGNOSIS — Z950 Presence of cardiac pacemaker: Secondary | ICD-10-CM | POA: Diagnosis not present

## 2021-07-02 ENCOUNTER — Telehealth: Payer: Self-pay

## 2021-07-02 NOTE — Telephone Encounter (Signed)
Remote ICM transmission received.  Attempted call to husband regarding ICM remote transmission and no answer.   

## 2021-07-02 NOTE — Progress Notes (Signed)
EPIC Encounter for ICM Monitoring  Patient Name: Melinda Mckinney is a 65 y.o. female Date: 07/02/2021 Primary Care Physican: Colon Branch, MD Primary Cardiologist: Aundra Dubin Electrophysiologist: Santina Evans Pacing: 99.8%         03/22/2021 Weight: 211 lbs (204 lbs is baseline)       Time in AT/AF  24.0 hr/day (100.0%)                                  Attempted call to husband and unable to reach.  Transmission reviewed.    Optivol thoracic impedance suggesting possible fluid accumulation starting 10/17 and returning to baseline on 11/6, 11/8, and 11/17.   Prescribed:  Torsemide 20 mg take 2 tablets (40 mg total) daily.  Spironolactone 25 mg take 1 tablet daily Farxiga 10 mg take 1 tablet daily   Labs:  05/05/2021 Creatinine 1.8(A), BUN 48(A), Potassium 4.7, Sodium 139 05/04/2021 Creatinine 1.87, BUN 45, Potassium 4.5, Sodium 131, GFR 29 04/26/2021 Creatinine 1.96, BUN 45, Potassium 3.9, Sodium 132, GFR 28 04/13/2021 Creatinine 1.59, BUN 27, Potassium 4.7, Sodium 140 04/05/2021 Creatinine 1.54, BUN 34, Potassium 3.8, Sodium 143, GFR 37  03/24/2021 Creatinine 1.80, BUN 35, Potassium 4.5, Sodium 137 03/02/2021 Creatinine 1.61, BUN 41, Potassium 4.3, Sodium 139 02/23/2021 Creatinine 1.84, BUN 63, Potassium 4.3, Sodium 139, GFR 28.08 A complete set of results can be found in Results Review.   Recommendations:  Unable to reach.     Follow-up plan: ICM clinic phone appointment on 08/02/2021.   91 day device clinic remote transmission 09/20/2021.     EP/Cardiology Office Visits:  08/12/2021 with Dr Aundra Dubin.       Copy of ICM check sent to Dr. Lovena Le  3 month ICM trend: 06/28/2021.    12-14 Month ICM trend:       Rosalene Billings, RN 07/02/2021 9:25 AM

## 2021-07-07 DIAGNOSIS — I129 Hypertensive chronic kidney disease with stage 1 through stage 4 chronic kidney disease, or unspecified chronic kidney disease: Secondary | ICD-10-CM | POA: Diagnosis not present

## 2021-07-07 DIAGNOSIS — N2581 Secondary hyperparathyroidism of renal origin: Secondary | ICD-10-CM | POA: Diagnosis not present

## 2021-07-07 DIAGNOSIS — Z905 Acquired absence of kidney: Secondary | ICD-10-CM | POA: Diagnosis not present

## 2021-07-07 DIAGNOSIS — D631 Anemia in chronic kidney disease: Secondary | ICD-10-CM | POA: Diagnosis not present

## 2021-07-07 DIAGNOSIS — N1832 Chronic kidney disease, stage 3b: Secondary | ICD-10-CM | POA: Diagnosis not present

## 2021-07-07 LAB — COMPREHENSIVE METABOLIC PANEL
Albumin: 4.3 (ref 3.5–5.0)
Calcium: 9.7 (ref 8.7–10.7)
GFR calc non Af Amer: 22

## 2021-07-07 LAB — BASIC METABOLIC PANEL
BUN: 63 — AB (ref 4–21)
CO2: 24 — AB (ref 13–22)
Chloride: 103 (ref 99–108)
Creatinine: 2.4 — AB (ref 0.5–1.1)
Glucose: 96
Potassium: 4.8 (ref 3.4–5.3)
Sodium: 139 (ref 137–147)

## 2021-07-09 ENCOUNTER — Other Ambulatory Visit (HOSPITAL_COMMUNITY): Payer: Self-pay | Admitting: Cardiology

## 2021-07-09 ENCOUNTER — Other Ambulatory Visit: Payer: Self-pay | Admitting: Internal Medicine

## 2021-07-09 DIAGNOSIS — I5022 Chronic systolic (congestive) heart failure: Secondary | ICD-10-CM

## 2021-07-12 ENCOUNTER — Other Ambulatory Visit (HOSPITAL_COMMUNITY): Payer: Self-pay | Admitting: Family Medicine

## 2021-07-12 ENCOUNTER — Encounter (HOSPITAL_COMMUNITY): Payer: Self-pay

## 2021-07-12 MED ORDER — SPIRONOLACTONE 25 MG PO TABS: 12.5000 mg | ORAL_TABLET | Freq: Every day | ORAL | 3 refills | Status: DC

## 2021-07-12 NOTE — Progress Notes (Signed)
Called patient to discuss decreasing spiro to 12.5 mg daily. Left message to call back. Medication list updated in chart.  Allena Katz, FNP-BC

## 2021-07-13 ENCOUNTER — Encounter: Payer: Self-pay | Admitting: Internal Medicine

## 2021-08-02 ENCOUNTER — Ambulatory Visit (INDEPENDENT_AMBULATORY_CARE_PROVIDER_SITE_OTHER): Payer: Medicare Other

## 2021-08-02 ENCOUNTER — Telehealth: Payer: Self-pay

## 2021-08-02 DIAGNOSIS — Z950 Presence of cardiac pacemaker: Secondary | ICD-10-CM | POA: Diagnosis not present

## 2021-08-02 DIAGNOSIS — I5022 Chronic systolic (congestive) heart failure: Secondary | ICD-10-CM

## 2021-08-02 NOTE — Telephone Encounter (Signed)
Remote ICM transmission received.  Attempted call to husband regarding ICM remote transmission and left message per DPR to return call.

## 2021-08-02 NOTE — Progress Notes (Signed)
Received: Today [place], Maricela Bo, FNP  Micahel Omlor Panda, RN Thanks Margarita Grizzle, she can increase torsemide to 40 mg bid until she sees Dr. Aundra Dubin. Needs a BMET in 1 week to follow kidney function closely.

## 2021-08-02 NOTE — Progress Notes (Signed)
Melinda Mckinney that no lab appointment are available in a week on 12/27 or 12/23.    She provided new recommendation:  Received: Today [place], Maricela Bo, FNP  Zamaya Rapaport Panda, RN OK, lets just double her torsemide x 5 days, then back to once daily. Her kidney function is tenuous. OK to repeat labs at visit with Dr. Aundra Dubin

## 2021-08-02 NOTE — Progress Notes (Signed)
EPIC Encounter for ICM Monitoring  Patient Name: Melinda Mckinney is a 65 y.o. female Date: 08/02/2021 Primary Care Physican: Colon Branch, MD Primary Cardiologist: Aundra Dubin Electrophysiologist: Santina Evans Pacing: 99.6%         08/02/2021 Weight: 208 lbs      Time in AT/AF  24.0 hr/day (100.0%)     Observations (2) (28-Jun-2021 to 02-Aug-2021)  AT/AF >= 6 hr for 36 days.  Possible OptiVol fluid accumulation: 09-Jun-2021 -- ongoing.                               Spoke with husband per DPR and transmission reviewed.  He reports patient is asymptomatic for fluid accumulation.  Pt does eats fast food breakfast almost daily.  Is not limiting salt intake.     Optivol thoracic impedance suggesting possible fluid accumulation starting 11/14.   Prescribed:  Torsemide 20 mg take 2 tablets (40 mg total) daily.  Spironolactone 25 mg take 1 tablet daily Farxiga 10 mg take 1 tablet daily   Labs:  07/07/2021 Creatinine 2.4,   BUN 63, Potassium 4.8, Sodium 139 05/05/2021 Creatinine 1.8,   BUN 48, Potassium 4.7, Sodium 139 05/04/2021 Creatinine 1.87, BUN 45, Potassium 4.5, Sodium 131, GFR 29 04/26/2021 Creatinine 1.96, BUN 45, Potassium 3.9, Sodium 132, GFR 28 04/13/2021 Creatinine 1.59, BUN 27, Potassium 4.7, Sodium 140 04/05/2021 Creatinine 1.54, BUN 34, Potassium 3.8, Sodium 143, GFR 37  03/24/2021 Creatinine 1.80, BUN 35, Potassium 4.5, Sodium 137 03/02/2021 Creatinine 1.61, BUN 41, Potassium 4.3, Sodium 139 02/23/2021 Creatinine 1.84, BUN 63, Potassium 4.3, Sodium 139, GFR 28.08 A complete set of results can be found in Results Review.   Recommendations:  Copy sent to Allena Katz, NP for review and recommendations since last OV was with Janett Billow.  Confirmed taking Torsemide 40 mg daily   Follow-up plan: ICM clinic phone appointment on 08/18/2021 to recheck fluid levels (fluid levels will be rechecked 12/29 OV with Dr Aundra Dubin).   91 day device clinic remote transmission 09/20/2021.      EP/Cardiology Office Visits:  08/12/2021 with Dr Aundra Dubin.       Copy of ICM check sent to Dr. Lovena Le.  3 month ICM trend: 08/02/2021.    12-14 Month ICM trend:       Rosalene Billings, RN 08/02/2021 12:36 PM

## 2021-08-02 NOTE — Progress Notes (Signed)
Attempted call to husband to provide recommendations and left message to return call.

## 2021-08-03 DIAGNOSIS — N1832 Chronic kidney disease, stage 3b: Secondary | ICD-10-CM | POA: Diagnosis not present

## 2021-08-03 NOTE — Progress Notes (Signed)
Attempted call to husband to provide recommendations and left message to return call.

## 2021-08-03 NOTE — Progress Notes (Signed)
Spoke with patient and husband.  [place], NP with [facility] recommended to increase torsemide to 40 mg bid x 5 days only.  Advised after 5th day to return to 40 mg daily as prescribed.  Stressed importance to only increase the dosage for 5 days and will be rechecked by Dr Aundra Dubin at 12/29 OV.

## 2021-08-12 ENCOUNTER — Other Ambulatory Visit: Payer: Self-pay

## 2021-08-12 ENCOUNTER — Ambulatory Visit (HOSPITAL_COMMUNITY)
Admission: RE | Admit: 2021-08-12 | Discharge: 2021-08-12 | Disposition: A | Discharge status: Home / Self Care | Payer: Medicare Other | Source: Ambulatory Visit | Attending: Cardiology | Admitting: Cardiology

## 2021-08-12 ENCOUNTER — Encounter (HOSPITAL_COMMUNITY): Payer: Self-pay | Admitting: Cardiology

## 2021-08-12 VITALS — BP 108/64 | HR 64 | Wt 212.6 lb

## 2021-08-12 DIAGNOSIS — I2510 Atherosclerotic heart disease of native coronary artery without angina pectoris: Secondary | ICD-10-CM | POA: Insufficient documentation

## 2021-08-12 DIAGNOSIS — Z87891 Personal history of nicotine dependence: Secondary | ICD-10-CM | POA: Diagnosis not present

## 2021-08-12 DIAGNOSIS — I4819 Other persistent atrial fibrillation: Secondary | ICD-10-CM | POA: Diagnosis not present

## 2021-08-12 DIAGNOSIS — I5022 Chronic systolic (congestive) heart failure: Secondary | ICD-10-CM | POA: Diagnosis not present

## 2021-08-12 DIAGNOSIS — N1832 Chronic kidney disease, stage 3b: Secondary | ICD-10-CM | POA: Insufficient documentation

## 2021-08-12 DIAGNOSIS — E039 Hypothyroidism, unspecified: Secondary | ICD-10-CM | POA: Diagnosis not present

## 2021-08-12 DIAGNOSIS — I428 Other cardiomyopathies: Secondary | ICD-10-CM

## 2021-08-12 DIAGNOSIS — E785 Hyperlipidemia, unspecified: Secondary | ICD-10-CM

## 2021-08-12 DIAGNOSIS — Z8616 Personal history of COVID-19: Secondary | ICD-10-CM | POA: Diagnosis not present

## 2021-08-12 DIAGNOSIS — Z79899 Other long term (current) drug therapy: Secondary | ICD-10-CM | POA: Diagnosis not present

## 2021-08-12 DIAGNOSIS — Z7901 Long term (current) use of anticoagulants: Secondary | ICD-10-CM | POA: Diagnosis not present

## 2021-08-12 DIAGNOSIS — I4821 Permanent atrial fibrillation: Secondary | ICD-10-CM

## 2021-08-12 DIAGNOSIS — I340 Nonrheumatic mitral (valve) insufficiency: Secondary | ICD-10-CM | POA: Insufficient documentation

## 2021-08-12 LAB — CBC
HCT: 35.5 % — ABNORMAL LOW (ref 36.0–46.0)
Hemoglobin: 11.1 g/dL — ABNORMAL LOW (ref 12.0–15.0)
MCH: 31.8 pg (ref 26.0–34.0)
MCHC: 31.3 g/dL (ref 30.0–36.0)
MCV: 101.7 fL — ABNORMAL HIGH (ref 80.0–100.0)
Platelets: 312 10*3/uL (ref 150–400)
RBC: 3.49 MIL/uL — ABNORMAL LOW (ref 3.87–5.11)
RDW: 13.1 % (ref 11.5–15.5)
WBC: 8.1 10*3/uL (ref 4.0–10.5)
nRBC: 0 % (ref 0.0–0.2)

## 2021-08-12 LAB — BASIC METABOLIC PANEL
Anion gap: 8 (ref 5–15)
BUN: 43 mg/dL — ABNORMAL HIGH (ref 8–23)
CO2: 26 mmol/L (ref 22–32)
Calcium: 9.7 mg/dL (ref 8.9–10.3)
Chloride: 105 mmol/L (ref 98–111)
Creatinine, Ser: 1.85 mg/dL — ABNORMAL HIGH (ref 0.44–1.00)
GFR, Estimated: 30 mL/min — ABNORMAL LOW (ref >60)
Glucose, Bld: 113 mg/dL — ABNORMAL HIGH (ref 70–99)
Potassium: 5.1 mmol/L (ref 3.5–5.1)
Sodium: 139 mmol/L (ref 135–145)

## 2021-08-12 LAB — LIPID PANEL
Cholesterol: 176 mg/dL (ref 0–200)
HDL: 65 mg/dL (ref >40)
LDL Cholesterol: 89 mg/dL (ref 0–99)
Total CHOL/HDL Ratio: 2.7 RATIO
Triglycerides: 109 mg/dL (ref <150)
VLDL: 22 mg/dL (ref 0–40)

## 2021-08-12 LAB — BRAIN NATRIURETIC PEPTIDE: B Natriuretic Peptide: 147.8 pg/mL — ABNORMAL HIGH (ref 0.0–100.0)

## 2021-08-12 NOTE — Patient Instructions (Signed)
Medication Changes:  No change   Lab Work:  Labs done today, your results will be available in MyChart, we will contact you for abnormal readings.   Testing/Procedures:  Your physician has recommended that you have a sleep study. This test records several body functions during sleep, including: brain activity, eye movement, oxygen and carbon dioxide blood levels, heart rate and rhythm, breathing rate and rhythm, the flow of air through your mouth and nose, snoring, body muscle movements, and chest and belly movement.   Referrals:  none  Special Instructions // Education:  none  Follow-Up in: 4 months (April 2023) **Call in March 2023 for appointment   At the [facility], you and your health needs are our priority. We have a designated team specialized in the treatment of Heart Failure. This Care Team includes your primary Heart Failure Specialized Cardiologist (physician), Advanced Practice Providers (APPs- Physician Assistants and Nurse Practitioners), and Pharmacist who all work together to provide you with the care you need, when you need it.   You may see any of the following providers on your designated Care Team at your next follow up:  Dr Glori Bickers Dr Haynes Kerns, NP Lyda Jester, [facility] [place], [place] Audry Riles, [name]   Please be sure to bring in all your medications bottles to every appointment.   Need to Contact us:  If you have any questions or concerns before your next appointment please send [place] a message through [place] or call our office at [phone].    TO LEAVE A MESSAGE FOR THE NURSE SELECT OPTION 2, PLEASE LEAVE A MESSAGE INCLUDING: YOUR NAME DATE OF BIRTH CALL BACK NUMBER REASON FOR CALL**this is important as we prioritize the call backs  YOU WILL RECEIVE A CALL BACK THE SAME DAY AS LONG AS YOU CALL BEFORE 4:00 PM

## 2021-08-12 NOTE — Progress Notes (Signed)
PCP: Colon Branch, MD  HF Cardiology: Dr. Aundra Dubin  65 y.o. with CAD s/p PCI in 2010, chronic combined systolic and diastolic HF (EF 56-38% in 2018), atrial fibrillation on chronic anticoagulation w/ Xarelto, HTN, and hypothyroidism was diagnosed w/ COVID-19 in Sep 2020 and treated at [facility] infection c/b PNA. She was discharged and readmitted back to [facility] 10/9-10/13/20 for gastroenteritis, felt to be a sequela of COVID-19. She was managed w/ supportive care and discharged home. Had f/u with PCP 10/22 and endorsed worsening LLQ pain leading to abdominal CT which showed acute diverticulitis. She was started on outpatient antibiotics, cipro + flagyl but symptoms failed to improve, prompting her to seek emergency medical care at [facility].  Initial EKG in the ED showed afib w/ RVR for which general cardiology was consulted. She was started on IV dilt for rate control but later required treatment w/ IV amiodarone due to persistent rapid ventricular response. Hospital course c/b volume overload, requiring IV diuretics. 2D echo was obtained and showed worsening LVEF, now 25-30% w/ moderate MR. She underwent subsequent R/LHC in 11/20 which demonstrated widely patent coronaries. RHC showed elevated mean RA pressure at 22, PWP 43, LVEDP 45 and low CO. CI by Fick 1.6. She underwent TEE-guided DCCV, TEE showed EF 15% with moderate RV dysfunction and moderate MR.  She went back into rapid atrial fibrillation.  She failed additional cardioversions.  Despite amiodarone gtt, HR remained in the 140s-150s.  Finally, she underwent AV nodal ablation with BiV pacing due to inability to control atrial fibrillation. While in the hospital, she was on milrinone gtt for a period of time, but we were able to taper it off. She was discharged home after medication optimization.   Echo in 2/21 showed EF up to 40-45%, mild diffuse hypokinesis, RV low normal function.  Echo in 3/22 showed EF 55-60%, RV normal, severe  LAE.    Patient presents for followup of CHF.  Weight is stable.  She reports bendopnea.  No dyspnea walking on flat ground.  No chest pain. Occasional lightheadedness if she stands too fast.  Now followed by a nephrologist, last creatinine elevated at 2.4.      Labs (11/20): digoxin level 1.4, creatinine 1.81, K 4.8, hgb 13.9 Labs (1/21): LDL 91, K 3.7, creatinine 1.32 Labs (2/21): K 5.1, creatinine 1.37, TSH normal, digoxin 0.4 Labs (5/21): LDL 68, digoxin 0.3 Labs (6/21): K 4.4, creatinine 1.21 Labs (1/22): K 4, creatinine 1.65, TSH normal Labs (7/22): LDL 78, K 4.3, creatinine 1.84 Labs (11/22): K 4.8, creatinine 2.4  Medtronic device interrogation: fluid index < threshold, stable thoracic impedance.    PMH: 1. Hypothyroidism 2. Single kidney s/p nephrectomy 3. Atrial fibrillation: Initially diagnosed in 2018.  She was on sotalol at one point to maintain NSR.  - Uncontrollable atrial fibrillation during 11/20 admission, patient eventually had AV nodal ablation with CRT-P implantation.  4. H/o achalasia 5. GERD 6. Fibromyalgia 7. H/o diverticulitis in 10/20 8. COVID-19 in 9/20.  9. H/o cervical cancer: cryotherapy.  10. Degenerative disc disease.  11. Chronic systolic CHF: Nonischemic cardiomyopathy, possibly tachycardia-mediated in setting of uncontrolled atrial fibrillation. Medtronic CRT-P device.  - Echo (2018): EF 45-50%.  - Echo (11/20): EF 25-30%, mildly decreased RV systolic function, moderate MR.  - TEE (11/20): EF 15%, moderately decreased RV systolic function, moderate MR.  - Cardiac MRI (11/20): EF 34%, RV EF 30%, no LGE (done after AV nodal ablation and BiV pacing).  - RHC (11/20): mean  RA 22, mean PCWP 43, LVEDP 45, CI 1.6 (Fick) and 2.7 (thermodilution).  - Echo (2/21): EF 40-45%, mild diffuse hypokinesis, low normal RV systolic function.  - Echo (5/22): EF 55-60%, RV normal, severe LAE 12. CAD: PCI in 2010.  - LHC (11/20): No significant coronary disease.    Social History   Socioeconomic History   Marital status: Married    Spouse name: Not on file   Number of children: 3   Years of education: Not on file   Highest education level: Not on file  Occupational History   Occupation: stay home   Tobacco Use   Smoking status: Former    Packs/day: 2.00    Years: 28.00    Pack years: 56.00    Types: Cigarettes    Quit date: 2000    Years since quitting: 23.0   Smokeless tobacco: Never  Vaping Use   Vaping Use: Never used  Substance and Sexual Activity   Alcohol use: Yes    Alcohol/week: 5.0 standard drinks    Types: 5 Cans of beer per week    Comment: 07/27/2017 "3-4 drinks//month"   Drug use: No   Sexual activity: Not Currently    Comment: 1ST INTERCOURSE- 16, PARTNERS - 2  Other Topics Concern   Not on file  Social History Narrative   Original from [place]   3 children, 2 alive   Household --pt and husband    Social Determinants of Radio broadcast assistant Strain: Not on file  Food Insecurity: Not on file  Transportation Needs: Not on file  Physical Activity: Not on file  Stress: Not on file  Social Connections: Not on file  Intimate Partner Violence: Not on file   Family History  Problem Relation Age of Onset   Breast cancer Other        aunt    CAD Brother    Lung cancer Mother        F and M   Diabetes Father        F and mother    CAD Father    Lung cancer Father    Stroke Sister    Colon cancer Neg Hx    ROS: All systems reviewed and negative except as per HPI.   Current Outpatient Medications  Medication Sig Dispense Refill   alendronate (FOSAMAX) 70 MG tablet Take 1 tablet (70 mg total) by mouth every 7 (seven) days. Take with a full glass of water on an empty stomach. 12 tablet 3   allopurinol (ZYLOPRIM) 100 MG tablet TAKE ONE TABLET BY MOUTH DAILY 90 tablet 1   atorvastatin (LIPITOR) 40 MG tablet TAKE 1 AND 1/2 TABLET BY MOUTH EVERY NIGHT AT BEDTIME 135 tablet 1   carvedilol (COREG) 6.25 MG  tablet TAKE ONE TABLET BY MOUTH TWICE A DAY WITH MEALS 180 tablet 3   Cholecalciferol (VITAMIN D3) 5000 UNITS CAPS Take 5,000 Units by mouth daily.      clobetasol cream (TEMOVATE) 0.05 %      cyclobenzaprine (FLEXERIL) 10 MG tablet Take 1 tablet (10 mg total) by mouth 2 (two) times daily as needed for muscle spasms. 60 tablet 1   DULoxetine (CYMBALTA) 60 MG capsule TAKE ONE CAPSULE BY MOUTH DAILY 90 capsule 1   FARXIGA 10 MG TABS tablet TAKE ONE TABLET BY MOUTH DAILY BEFORE BREAKFAST 30 tablet 4   ferrous fumarate (HEMOCYTE - 106 MG FE) 325 (106 Fe) MG TABS tablet Take 1 tablet (106 mg of  iron total) by mouth 2 (two) times daily. Do not take at the same time as omeprazole (Prilosec) 60 tablet 6   ketoconazole (NIZORAL) 2 % cream Apply 1 application topically daily. 60 g 0   levothyroxine (SYNTHROID) 112 MCG tablet TAKE ONE TABLET BY MOUTH DAILY BEFORE BREAKFAST 90 tablet 1   metFORMIN (GLUCOPHAGE) 500 MG tablet TAKE ONE TABLET BY MOUTH TWICE A DAY WITH MEALS 180 tablet 1   omeprazole (PRILOSEC) 20 MG capsule TAKE ONE CAPSULE BY MOUTH TWICE A DAY BEFORE A MEAL 180 capsule 1   sacubitril-valsartan (ENTRESTO) 49-51 MG Take 1 tablet by mouth 2 (two) times daily. 60 tablet 5   spironolactone (ALDACTONE) 25 MG tablet Take 0.5 tablets (12.5 mg total) by mouth daily. 90 tablet 3   torsemide (DEMADEX) 20 MG tablet Take 2 tablets (40 mg total) by mouth daily. 180 tablet 3   XARELTO 15 MG TABS tablet TAKE ONE TABLET BY MOUTH DAILY WITH SUPPER 90 tablet 1   No current facility-administered medications for this encounter.   BP 108/64    Pulse 64    Wt 96.4 kg (212 lb 9.6 oz)    SpO2 99%    BMI 35.38 kg/m  General: NAD Neck: No JVD, no thyromegaly or thyroid nodule.  Lungs: Clear to auscultation bilaterally with normal respiratory effort. CV: Nondisplaced PMI.  Heart regular S1/S2, no S3/S4, no murmur.  No peripheral edema.  No carotid bruit.  Normal pedal pulses.  Abdomen: Soft, nontender, no  hepatosplenomegaly, no distention.  Skin: Intact without lesions or rashes.  Neurologic: Alert and oriented x 3.  Psych: Normal affect. Extremities: No clubbing or cyanosis.  HEENT: Normal.   Assessment/Plan: 1. Chronic systolic CHF:  Nonischemic cardiomyopathy.  Echo in 2018 with EF 45-50%.  Echo in 11/20 with EF 25-30%, diffuse hypokinesis, mildly decreased RV systolic function, moderate MR. LHC/RHC in 11/20 with no significant coronary disease, markedly elevated filling pressures and low cardiac output (CI 1.6).  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 Cardiac MRI 11/20 showed LVEF 34%, RVEF 30%, no LGE => perhaps pointing to tachy-mediated CMP as more likely etiology.  Initially required inotropic support w/ milrinone but able to titrate off.  Now s/p AV nodal ablation with MDT CRT-P.  Echo in 2/21 showed EF up to 40-45%, echo in 3/22 with EF up to 55-60%.  NYHA class II symptoms, not volume overloaded by exam or Optivol.   - Continue torsemide 40 mg daily. BMET/BNP.  - Continue Entresto to 49/51 bid.   - Continue dapagliflozin 10 mg daily.  - Continue Coreg 6.25 mg bid.   - Continue spironolactone 12.5 mg daily.  2. Atrial fibrillation: Persistent atrial fibrillation, it appears, at least since 9/20 when she was admitted with COVID-19. Prior, she had paroxysmal atrial fibrillation and was maintained on sotalol. She is now off sotalol.  Unable to cardiovert or rate control atrial fibrillation. Therefore, she had AV nodal ablation with BiV pacing.  - Continue Xarelto 15 mg daily. CBC today.  3. Mitral regurgitation: Functional MR, moderate on TEE.  Rate was controlled on cardiac MRI, MR was less impressive.  Minimal MR on subsequent echoes.  4. CKD stage 3b: BMET today.  She is on [place].  5. Suspect OSA: I will arrange for in-hospital sleep study.    Followup with APP in 4  months.   [name] 08/12/2021

## 2021-08-18 ENCOUNTER — Telehealth: Payer: Self-pay

## 2021-08-18 ENCOUNTER — Ambulatory Visit (INDEPENDENT_AMBULATORY_CARE_PROVIDER_SITE_OTHER): Payer: Medicare Other

## 2021-08-18 DIAGNOSIS — Z950 Presence of cardiac pacemaker: Secondary | ICD-10-CM

## 2021-08-18 DIAGNOSIS — I5022 Chronic systolic (congestive) heart failure: Secondary | ICD-10-CM

## 2021-08-18 NOTE — Telephone Encounter (Signed)
Remote ICM transmission received.  Attempted call to patient regarding ICM remote transmission and left detailed message per DPR.  Advised to return call for any fluid symptoms or questions.  

## 2021-08-18 NOTE — Progress Notes (Signed)
EPIC Encounter for ICM Monitoring  Patient Name: Melinda Mckinney is a 66 y.o. female Date: 08/18/2021 Primary Care Physican: Colon Branch, MD Primary Cardiologist: Aundra Dubin Electrophysiologist: Santina Evans Pacing: 99.5%         08/02/2021 Weight: 208 lbs      Time in AT/AF  24.0 hr/day (100.0%)      Observations (1) (12-Aug-2021 to 18-Aug-2021)  AT/AF >= 6 hr for 7 days                            Attempted call to husband and unable to reach.  Left detailed message per DPR regarding transmission. Transmission reviewed.    Optivol thoracic impedance suggesting possible fluid accumulation starting 12/24.  Fluid index trending up but remains < normal threshold.   Prescribed:  Torsemide 20 mg take 2 tablets (40 mg total) daily.  Spironolactone 25 mg take 1 tablet daily Farxiga 10 mg take 1 tablet daily   Labs:  08/12/2022 Creatinine 1.85, BUN 43, Potassium 5.1, Sodium 139, GFR 30 07/07/2021 Creatinine 2.4,   BUN 63, Potassium 4.8, Sodium 139 05/05/2021 Creatinine 1.8,   BUN 48, Potassium 4.7, Sodium 139 05/04/2021 Creatinine 1.87, BUN 45, Potassium 4.5, Sodium 131, GFR 29 04/26/2021 Creatinine 1.96, BUN 45, Potassium 3.9, Sodium 132, GFR 28 04/13/2021 Creatinine 1.59, BUN 27, Potassium 4.7, Sodium 140 04/05/2021 Creatinine 1.54, BUN 34, Potassium 3.8, Sodium 143, GFR 37  03/24/2021 Creatinine 1.80, BUN 35, Potassium 4.5, Sodium 137 03/02/2021 Creatinine 1.61, BUN 41, Potassium 4.3, Sodium 139 02/23/2021 Creatinine 1.84, BUN 63, Potassium 4.3, Sodium 139, GFR 28.08 A complete set of results can be found in Results Review.   Recommendations:  Left voice mail with ICM number and encouraged to call if experiencing any fluid symptoms.   Follow-up plan: ICM clinic phone appointment on 08/24/2021 to recheck fluid levels.   91 day device clinic remote transmission 09/20/2021.     EP/Cardiology Office Visits:  08/12/2021 with Dr Aundra Dubin.       Copy of ICM check sent to Dr. Lovena Le.  Will send to Dr  Aundra Dubin for review if patient is reached.  3 month ICM trend: 08/18/2021.    12-14 Month ICM trend:     Rosalene Billings, RN 08/18/2021 1:57 PM

## 2021-08-24 ENCOUNTER — Ambulatory Visit (INDEPENDENT_AMBULATORY_CARE_PROVIDER_SITE_OTHER): Payer: Medicare Other

## 2021-08-24 ENCOUNTER — Telehealth: Payer: Self-pay

## 2021-08-24 DIAGNOSIS — I5022 Chronic systolic (congestive) heart failure: Secondary | ICD-10-CM

## 2021-08-24 DIAGNOSIS — Z950 Presence of cardiac pacemaker: Secondary | ICD-10-CM

## 2021-08-24 NOTE — Telephone Encounter (Signed)
Remote ICM transmission received.  Attempted call to patient regarding ICM remote transmission and no answer.  

## 2021-08-24 NOTE — Progress Notes (Signed)
EPIC Encounter for ICM Monitoring  Patient Name: Melinda Mckinney is a 66 y.o. female Date: 08/24/2021 Primary Care Physican: Colon Branch, MD Primary Cardiologist: Aundra Dubin Electrophysiologist: Santina Evans Pacing: 99.6%         08/02/2021 Weight: 208 lbs      Time in AT/AF  24.0 hr/day (100.0%)      Observations (1) (12-Aug-2021 to 18-Aug-2021)  AT/AF >= 6 hr for 7 days                            Attempted call to husband and unable to reach.  Left detailed message per DPR regarding transmission. Transmission reviewed.    Optivol thoracic impedance suggesting fluid levels returned close to baseline.  Fluid index trending up but remains < normal threshold.   Prescribed:  Torsemide 20 mg take 2 tablets (40 mg total) daily.  Spironolactone 25 mg take 1 tablet daily Farxiga 10 mg take 1 tablet daily   Labs:  08/12/2021 Creatinine 1.85, BUN 43, Potassium 5.1, Sodium 139, GFR 30 07/07/2021 Creatinine 2.4,   BUN 63, Potassium 4.8, Sodium 139 05/05/2021 Creatinine 1.8,   BUN 48, Potassium 4.7, Sodium 139 05/04/2021 Creatinine 1.87, BUN 45, Potassium 4.5, Sodium 131, GFR 29 04/26/2021 Creatinine 1.96, BUN 45, Potassium 3.9, Sodium 132, GFR 28 04/13/2021 Creatinine 1.59, BUN 27, Potassium 4.7, Sodium 140 04/05/2021 Creatinine 1.54, BUN 34, Potassium 3.8, Sodium 143, GFR 37  03/24/2021 Creatinine 1.80, BUN 35, Potassium 4.5, Sodium 137 03/02/2021 Creatinine 1.61, BUN 41, Potassium 4.3, Sodium 139 02/23/2021 Creatinine 1.84, BUN 63, Potassium 4.3, Sodium 139, GFR 28.08 A complete set of results can be found in Results Review.   Recommendations:  Left voice mail with ICM number and encouraged to call if experiencing any fluid symptoms.   Follow-up plan: ICM clinic phone appointment on 09/14/2021.   91 day device clinic remote transmission 09/20/2021.     EP/Cardiology Office Visits:  Recall 12/11/2021 with [facility] NP/PA.  Recall 03/31/2022 with Tommye Standard, PA.       Copy of ICM check sent to Dr.  Lovena Le.  3 month ICM trend: 08/24/2021.    12-14 Month ICM trend:     Rosalene Billings, RN 08/24/2021 2:34 PM

## 2021-08-26 ENCOUNTER — Other Ambulatory Visit: Payer: Self-pay

## 2021-08-26 ENCOUNTER — Ambulatory Visit (INDEPENDENT_AMBULATORY_CARE_PROVIDER_SITE_OTHER): Payer: Medicare Other | Admitting: Internal Medicine

## 2021-08-26 ENCOUNTER — Encounter: Payer: Self-pay | Admitting: Internal Medicine

## 2021-08-26 VITALS — BP 100/58 | HR 91 | Ht 65.0 in | Wt 210.2 lb

## 2021-08-26 DIAGNOSIS — E039 Hypothyroidism, unspecified: Secondary | ICD-10-CM

## 2021-08-26 DIAGNOSIS — E559 Vitamin D deficiency, unspecified: Secondary | ICD-10-CM

## 2021-08-26 DIAGNOSIS — M810 Age-related osteoporosis without current pathological fracture: Secondary | ICD-10-CM

## 2021-08-26 LAB — T4, FREE: Free T4: 1.09 ng/dL (ref 0.60–1.60)

## 2021-08-26 LAB — VITAMIN D 25 HYDROXY (VIT D DEFICIENCY, FRACTURES): VITD: 58.09 ng/mL (ref 30.00–100.00)

## 2021-08-26 LAB — TSH: TSH: 3.35 u[IU]/mL (ref 0.35–5.50)

## 2021-08-26 NOTE — Progress Notes (Signed)
Patient ID: Melinda Mckinney, female   DOB: 04-28-1956, 66 y.o.   MRN: [id number]  This visit occurred during the SARS-CoV-2 public health emergency.  Safety protocols were in place, including screening questions prior to the visit, additional usage of staff PPE, and extensive cleaning of exam room while observing appropriate contact time as indicated for disinfecting solutions.   HPI  Melinda Mckinney is a 66 y.o.-year-old female, returning for f/u for vitamin D deficiency, osteoporosis, and hypothyroidism. Last visit 1 year ago.  She is here with her husband who offers part of the history especially regarding her recent medical history, medication regimen and symptoms.  He also translates for [place].  Prev. Seen by Dr. Toney Rakes, now retired.  Interim history: No falls or fractures since last visit. No disequilibrium, vertigo, vision problems. Occasional dizziness - when changing position (orthostasis). She has a pruritic rash - entire body - started 1 mo ago.  Unclear culprits.  Osteoporosis.   Reviewed DXA scan reports: Date Lumbar spine L1-L4 Femoral neck (FN)  09/06/2018 ([place]) -2.8 RFN: -1.5 LFN: -1.1   Previously: Date L1-L4 T score FN T score  09/04/2014 ([facility])  -2.8 LFN: -1.1 RFN: -1.2  03/25/2007 ([facility])  -2.6    She had a possible hand fracture in 03/2020.  This was not clearly confirmed.  Previously on Evista since 09/2017 but stopped in 2020 - prev. Rx'ed by Dr. Toney Rakes.   We started Fosamax 70 mg weekly in 08/2019.  She denies jaw/hip/thigh pain.  Vitamin D def  -Diagnosed in 2000 -No history of hypercalcemia  Reviewed her vitamin D levels: Lab Results  Component Value Date   VD25OH 66.05 08/25/2020   VD25OH 45.46 08/22/2019   VD25OH 32.55 04/26/2018   VD25OH 40 09/08/2016   VD25OH 29.88 (L) 08/09/2016   VD25OH 24 (L) 09/03/2015   VD25OH 29.63 (L) 08/07/2015   VD25OH 26.02 (L) 01/19/2015   VD25OH 17 (L) 12/12/2014   VD25OH 16 (L) 12/10/2014   She takes  5000 units vitamin daily.  Reviewed history: Celiac panel was negative in 11/2014.  She has a history of abdominal pain/bloating/nausea/sometimes vomiting. She was seen by GI >> EGD >> had surgery for achalasia.  Lab Results  Component Value Date   PTH 31 09/12/2014   CALCIUM 9.7 08/12/2021   CALCIUM 9.7 07/07/2021   CALCIUM 10.2 05/05/2021   CALCIUM 9.5 05/04/2021   CALCIUM 9.6 04/26/2021   CALCIUM 9.6 04/13/2021   CALCIUM 9.3 04/05/2021   CALCIUM 9.8 03/24/2021   CALCIUM 9.1 03/02/2021   CALCIUM 9.9 02/23/2021   She has a history of kidney stones. R nephrectomy for hydronephrosis 2/2 kidney stone.  At that time, she also had surgery for incarcerated hernia - had several hernias  + CKD: Lab Results  Component Value Date   BUN 43 (H) 08/12/2021   CREATININE 1.85 (H) 08/12/2021   Hypothyroidism:  She is taking 112 mcg daily, dose decreased by PCP in 02/2021: - in am - fasting - at least 30 min from b'fast - no calcium - + iron - added 3-4h after LT4 - no multivitamins - + PPIs -first dose at least 3 hours after LT4 - not on Biotin  Reviewed her TFTs: Lab Results  Component Value Date   TSH 0.72 06/22/2021   TSH 0.23 (L) 02/23/2021   TSH 0.94 08/25/2020   TSH 1.33 05/20/2020   TSH 0.18 (L) 03/18/2020   TSH 1.232 10/04/2019   TSH 5.07 (H) 08/22/2019   TSH 8.342 (H) 06/18/2019  TSH 1.627 05/25/2019   TSH 1.467 04/30/2019   Pt denies: - feeling nodules in neck - hoarseness - choking - SOB with lying down She has a history of dysphagia, believed to be due to GERD.  She is on PPIs. She is scheduled for EGD and colonoscopy next month.  + FH with thyroid ds: Mother, daughter, and son. No FH of thyroid cancer. No h/o radiation tx to head or neck.  No herbal supplements. No Biotin use. No recent steroids use.   She also has a history of prior nephrectomy, osteoarthritis, A fib, history of acute CHF.  On metoprolol and Xarelto.  She had 2 previous cardioversions  in the past.  ROS: Gastrointestinal: no N/no V/no D/no C/no acid reflux  I reviewed pt's medications, allergies, PMH, social hx, family hx, and changes were documented in the history of present illness. Otherwise, unchanged from my initial visit note.  Past Medical History:  Diagnosis Date   Anemia    Anxiety and depression    Asthma    Borderline diabetes    CAD (coronary artery disease) ~2009   stent    Cervical cancer ([place])    "stage 1; had cryotherapy"   CHF (congestive heart failure) (HCC)    Chronic lower back pain    Complete heart block ([place]) 06/2019   s/p AV nodal ablation and CRT-P by Dr Lovena Le   DDD (degenerative disc disease), lumbar    Dr. Nelva Bush, recommends lumbar epidural steroid injections L5-S1 to the right   Depression    Fibromyalgia    GERD (gastroesophageal reflux disease)    High cholesterol    History of hiatal hernia    History of kidney stones    Hypertension    Hypothyroidism    Osteoporosis    Permanent atrial fibrillation ([place]) 07/2017   s/p AV nodal ablation   Thyroid disease    Past Surgical History:  Procedure Laterality Date   AV NODE ABLATION N/A 06/24/2019   Procedure: AV NODE ABLATION;  Surgeon: Evans Lance, MD;  Location: [place] [facility];  Service: Cardiovascular;  Laterality: N/A;   BIV PACEMAKER INSERTION CRT-P N/A 06/24/2019   Procedure: BIV PACEMAKER INSERTION CRT-P;  Surgeon: Evans Lance, MD;  Location: [place] [facility];  Service: Cardiovascular;  Laterality: N/A;   CARDIAC CATHETERIZATION  ~ 2008   CARDIOVERSION N/A 08/05/2017   Procedure: CARDIOVERSION;  Surgeon: Evans Lance, MD;  [facility];  Service: Cardiovascular;  Laterality: N/A;   CARDIOVERSION N/A 08/24/2017   Procedure: CARDIOVERSION;  Surgeon: Sanda Klein, MD;  Location: [place] [facility];  Service: Cardiovascular;  Laterality: N/A;   CARDIOVERSION N/A 06/19/2019   Procedure: CARDIOVERSION;  Surgeon: Larey Dresser, MD;  Location: [place];  Service:  Cardiovascular;  Laterality: N/A;   CARDIOVERSION N/A 06/21/2019   Procedure: CARDIOVERSION;  Surgeon: Larey Dresser, MD;  Location: [facility] ENDOSCOPY;  Service: Cardiovascular;  Laterality: N/A;   [place]; ~ 1978/1979; 1984   ESOPHAGOMYOTOMY  2009   GYNECOLOGIC CRYOSURGERY  X 2   "stage 1 cancer"   HERNIA REPAIR  X 6   "all in my stomach" (07/27/2017)   KNEE ARTHROSCOPY Right    NEPHRECTOMY Right 1991   in [place], due to fibrosis and lithiasis   RIGHT/LEFT HEART CATH AND CORONARY ANGIOGRAPHY N/A 06/17/2019   Procedure: RIGHT/LEFT HEART CATH AND CORONARY ANGIOGRAPHY;  Surgeon: Lorretta Harp, MD;  Location: [place] [facility];  Service: Cardiovascular;  Laterality:  N/A;   TEE WITHOUT CARDIOVERSION N/A 06/19/2019   Procedure: TRANSESOPHAGEAL ECHOCARDIOGRAM (TEE);  Surgeon: Larey Dresser, MD;  Location: [facility] OR;  Service: Cardiovascular;  Laterality: N/A;   TUBAL LIGATION     Social History   Socioeconomic History   Marital status: Married    Spouse name: Not on file   Number of children: 3   Years of education: Not on file   Highest education level: Not on file  Occupational History   Occupation: stay home   Tobacco Use   Smoking status: Former    Packs/day: 2.00    Years: 28.00    Pack years: 56.00    Types: Cigarettes    Quit date: 2000    Years since quitting: 23.0   Smokeless tobacco: Never  Vaping Use   Vaping Use: Never used  Substance and Sexual Activity   Alcohol use: Yes    Alcohol/week: 5.0 standard drinks    Types: 5 Cans of beer per week    Comment: 07/27/2017 "3-4 drinks//month"   Drug use: No   Sexual activity: Not Currently    Comment: 1ST INTERCOURSE- 16, PARTNERS - 2  Other Topics Concern   Not on file  Social History Narrative   Original from [place]   3 children, 2 alive   Household --pt and husband    Social Determinants of Radio broadcast assistant Strain: Not on file  Food Insecurity: Not on file  Transportation  Needs: Not on file  Physical Activity: Not on file  Stress: Not on file  Social Connections: Not on file  Intimate Partner Violence: Not on file   Current Outpatient Medications on File Prior to Visit  Medication Sig Dispense Refill   alendronate (FOSAMAX) 70 MG tablet Take 1 tablet (70 mg total) by mouth every 7 (seven) days. Take with a full glass of water on an empty stomach. 12 tablet 3   allopurinol (ZYLOPRIM) 100 MG tablet TAKE ONE TABLET BY MOUTH DAILY 90 tablet 1   atorvastatin (LIPITOR) 40 MG tablet TAKE 1 AND 1/2 TABLET BY MOUTH EVERY NIGHT AT BEDTIME 135 tablet 1   carvedilol (COREG) 6.25 MG tablet TAKE ONE TABLET BY MOUTH TWICE A DAY WITH MEALS 180 tablet 3   Cholecalciferol (VITAMIN D3) 5000 UNITS CAPS Take 5,000 Units by mouth daily.      clobetasol cream (TEMOVATE) 0.05 %      cyclobenzaprine (FLEXERIL) 10 MG tablet Take 1 tablet (10 mg total) by mouth 2 (two) times daily as needed for muscle spasms. 60 tablet 1   DULoxetine (CYMBALTA) 60 MG capsule TAKE ONE CAPSULE BY MOUTH DAILY 90 capsule 1   FARXIGA 10 MG TABS tablet TAKE ONE TABLET BY MOUTH DAILY BEFORE BREAKFAST 30 tablet 4   ferrous fumarate (HEMOCYTE - 106 MG FE) 325 (106 Fe) MG TABS tablet Take 1 tablet (106 mg of iron total) by mouth 2 (two) times daily. Do not take at the same time as omeprazole (Prilosec) 60 tablet 6   ketoconazole (NIZORAL) 2 % cream Apply 1 application topically daily. 60 g 0   levothyroxine (SYNTHROID) 112 MCG tablet TAKE ONE TABLET BY MOUTH DAILY BEFORE BREAKFAST 90 tablet 1   metFORMIN (GLUCOPHAGE) 500 MG tablet TAKE ONE TABLET BY MOUTH TWICE A DAY WITH MEALS 180 tablet 1   omeprazole (PRILOSEC) 20 MG capsule TAKE ONE CAPSULE BY MOUTH TWICE A DAY BEFORE A MEAL 180 capsule 1   sacubitril-valsartan (ENTRESTO) 49-51 MG Take 1 tablet  by mouth 2 (two) times daily. 60 tablet 5   spironolactone (ALDACTONE) 25 MG tablet Take 0.5 tablets (12.5 mg total) by mouth daily. 90 tablet 3   torsemide (DEMADEX)  20 MG tablet Take 2 tablets (40 mg total) by mouth daily. 180 tablet 3   XARELTO 15 MG TABS tablet TAKE ONE TABLET BY MOUTH DAILY WITH SUPPER 90 tablet 1   No current facility-administered medications on file prior to visit.   Allergies  Allergen Reactions   Penicillins Swelling and Rash    Has patient had a PCN reaction causing immediate rash, facial/tongue/throat swelling, SOB or lightheadedness with hypotension: No Has patient had a PCN reaction causing severe rash involving mucus membranes or skin necrosis: No Has patient had a PCN reaction that required hospitalization: No Has patient had a PCN reaction occurring within the last 10 years: No If all of the above answers are "NO", then may proceed with Cephalosporin use.   Family History  Problem Relation Age of Onset   Breast cancer Other        aunt    CAD Brother    Lung cancer Mother        F and M   Diabetes Father        F and mother    CAD Father    Lung cancer Father    Stroke Sister    Colon cancer Neg Hx    PE: BP (!) 100/58 (BP Location: Right Arm, Patient Position: Sitting, Cuff Size: Normal)    Pulse 91    Ht 5\' 5"  (1.651 m)    Wt 210 lb 3.2 oz (95.3 kg)    SpO2 98%    BMI 34.98 kg/m   Wt Readings from Last 3 Encounters:  08/26/21 210 lb 3.2 oz (95.3 kg)  08/12/21 212 lb 9.6 oz (96.4 kg)  06/22/21 208 lb (94.3 kg)   Constitutional: overweight, in NAD Eyes: PERRLA, EOMI, no exophthalmos ENT: moist mucous membranes, no thyromegaly, no cervical lymphadenopathy Cardiovascular: RRR (no tachycardia at the time of the exam), No MRG Respiratory: CTA B Musculoskeletal: no deformities, strength intact in all 4 Skin: moist, warm, + macular rash over trunk, abdomen, arms and legs Neurological: + tremor with outstretched hands, DTR normal in all 4  Assessment: 1.  Osteoporosis  2. Vitamin D deficiency -Reviewing her chart, there is no obvious reason for malabsorption: Celiac workup was negative, no colon diseases,  she had a history of Barrett's esophagus and has surveillance EGDs and also had achalasia surgery  3. Hypothyroidism  PLAN: 1. Osteoporosis -No history of fragility fractures (she was investigated for hand fracture in 03/2020); no falls since last visit -I reviewed the report of her DXA scan from 08/2018 and this showed that the lumbar spine T score was low, at -2.8, but stable.  Right femoral neck BMD was lower than before, however, the last 2 bone density scans were done on different machines, so they were not directly comparable.  She is due for another DXA scan-I ordered this at last visit but she did not have it done yet.  I ordered this again now and strongly advised her to schedule it. -She was previously on Evista the past, for 4 years, but we started alendronate 70 mg weekly.  She tolerates this well.  No thigh/hip/jaw pain.  I advised her to take this before levothyroxine. -At last visit we discussed about the importance of exercise -We will check a vitamin D level today  2.  Vitamin D insufficiency -She was previously on high-dose ergocalciferol once a week in the past, but currently on daily cholecalciferol -She continues on 5000 units vitamin D daily -We checked her vitamin D level at last visit and this was normal, at 14 -We will repeat the level today  3. Hypothyroidism - latest thyroid labs reviewed with pt. >> normal: Lab Results  Component Value Date   TSH 0.72 06/22/2021  - she continues on LT4 112 mcg daily - pt feels good on this dose. - we discussed about taking the thyroid hormone every day, with water, >30 minutes before breakfast, separated by >4 hours from acid reflux medications, calcium, iron, multivitamins. Pt. is taking it correctly. - will check thyroid tests today: TSH and fT4 - If labs are abnormal, she will need to return for repeat TFTs in 1.5 months  Regarding the rash, I advised her to get in touch with PCP.  Component     Latest Ref Rng & Units  08/26/2021  TSH     0.35 - 5.50 uIU/mL 3.35  VITD     30.00 - 100.00 ng/mL 58.09  T4,Free(Direct)     0.60 - 1.60 ng/dL 1.09  Labs are normal.  Philemon Kingdom, MD PhD [facility]

## 2021-08-26 NOTE — Patient Instructions (Signed)
Please call and schedule bone density scan at the [facility]: [phone].   Please stop at the lab.  Continue Levothyroxine 125 mcg daily.  Take the thyroid hormone every day, with water, at least 30 minutes before breakfast, separated by at least 4 hours from: - acid reflux medications - calcium - iron - multivitamins  Continue Fosamax 70 mg weekly.   Please come back for a follow-up appointment in 1 year.

## 2021-09-06 ENCOUNTER — Other Ambulatory Visit: Payer: Self-pay | Admitting: Internal Medicine

## 2021-09-06 ENCOUNTER — Other Ambulatory Visit (HOSPITAL_COMMUNITY): Payer: Self-pay | Admitting: Cardiology

## 2021-09-06 NOTE — Telephone Encounter (Signed)
Prescription refill request for Xarelto received.  Indication:Afib Last office visit:9/22 Weight:95.3 kg Age:66 Scr:1.8 CrCl:41.55 ml/min  Prescription refilled

## 2021-09-14 ENCOUNTER — Ambulatory Visit (INDEPENDENT_AMBULATORY_CARE_PROVIDER_SITE_OTHER): Payer: Medicare Other

## 2021-09-14 DIAGNOSIS — I5022 Chronic systolic (congestive) heart failure: Secondary | ICD-10-CM

## 2021-09-14 DIAGNOSIS — Z950 Presence of cardiac pacemaker: Secondary | ICD-10-CM

## 2021-09-17 NOTE — Progress Notes (Signed)
EPIC Encounter for ICM Monitoring  Patient Name: Melinda Mckinney is a 66 y.o. female Date: 09/17/2021 Primary Care Physican: Colon Branch, MD Primary Cardiologist: Aundra Dubin Electrophysiologist: Santina Evans Pacing: 99.6%         08/26/2021 Office Weight: 210 lbs      Time in AT/AF  24.0 hr/day (100.0%)      Observations (1) (12-Aug-2021 to 18-Aug-2021)  AT/AF >= 6 hr for 7 days                            Transmission reviewed.    Optivol thoracic impedance normal but was suggesting possible fluid accumulation from 12/23 - 1/21 and again 1/25-1/29   Prescribed:  Torsemide 20 mg take 2 tablets (40 mg total) daily.  Spironolactone 25 mg take 1 tablet daily Farxiga 10 mg take 1 tablet daily   Labs:  08/12/2021 Creatinine 1.85, BUN 43, Potassium 5.1, Sodium 139, GFR 30 07/07/2021 Creatinine 2.4,   BUN 63, Potassium 4.8, Sodium 139 05/05/2021 Creatinine 1.8,   BUN 48, Potassium 4.7, Sodium 139 05/04/2021 Creatinine 1.87, BUN 45, Potassium 4.5, Sodium 131, GFR 29 04/26/2021 Creatinine 1.96, BUN 45, Potassium 3.9, Sodium 132, GFR 28 04/13/2021 Creatinine 1.59, BUN 27, Potassium 4.7, Sodium 140 04/05/2021 Creatinine 1.54, BUN 34, Potassium 3.8, Sodium 143, GFR 37  03/24/2021 Creatinine 1.80, BUN 35, Potassium 4.5, Sodium 137 03/02/2021 Creatinine 1.61, BUN 41, Potassium 4.3, Sodium 139 02/23/2021 Creatinine 1.84, BUN 63, Potassium 4.3, Sodium 139, GFR 28.08 A complete set of results can be found in Results Review.   Recommendations:  No changes.   Follow-up plan: ICM clinic phone appointment on 10/18/2021.   91 day device clinic remote transmission 09/20/2021.     EP/Cardiology Office Visits:  Recall 12/11/2021 with [facility] NP/PA.  Recall 03/31/2022 with Tommye Standard, PA.       Copy of ICM check sent to Dr. Lovena Le.  3 month ICM trend: 09/14/2021.    12-14 Month ICM trend:     Rosalene Billings, RN 09/17/2021 12:26 PM

## 2021-09-23 ENCOUNTER — Encounter: Payer: Self-pay | Admitting: Internal Medicine

## 2021-10-07 ENCOUNTER — Ambulatory Visit (INDEPENDENT_AMBULATORY_CARE_PROVIDER_SITE_OTHER): Payer: Medicare Other

## 2021-10-07 DIAGNOSIS — I428 Other cardiomyopathies: Secondary | ICD-10-CM

## 2021-10-07 DIAGNOSIS — Z1231 Encounter for screening mammogram for malignant neoplasm of breast: Secondary | ICD-10-CM | POA: Diagnosis not present

## 2021-10-07 LAB — CUP PACEART REMOTE DEVICE CHECK
Battery Remaining Longevity: 99 mo
Battery Voltage: 2.98 V
Brady Statistic RA Percent Paced: 0 %
Brady Statistic RV Percent Paced: 99.96 %
Date Time Interrogation Session: 20230223014110
Implantable Lead Implant Date: 20201109
Implantable Lead Implant Date: 20201109
Implantable Lead Implant Date: 20201109
Implantable Lead Location: 753858
Implantable Lead Location: 753859
Implantable Lead Location: 753860
Implantable Lead Model: 4598
Implantable Lead Model: 5076
Implantable Lead Model: 5076
Implantable Pulse Generator Implant Date: 20201109
Lead Channel Impedance Value: 1045 Ohm
Lead Channel Impedance Value: 1102 Ohm
Lead Channel Impedance Value: 1121 Ohm
Lead Channel Impedance Value: 1273 Ohm
Lead Channel Impedance Value: 1311 Ohm
Lead Channel Impedance Value: 399 Ohm
Lead Channel Impedance Value: 494 Ohm
Lead Channel Impedance Value: 513 Ohm
Lead Channel Impedance Value: 551 Ohm
Lead Channel Impedance Value: 570 Ohm
Lead Channel Impedance Value: 741 Ohm
Lead Channel Impedance Value: 760 Ohm
Lead Channel Impedance Value: 779 Ohm
Lead Channel Impedance Value: 836 Ohm
Lead Channel Pacing Threshold Amplitude: 0.5 V
Lead Channel Pacing Threshold Amplitude: 1 V
Lead Channel Pacing Threshold Pulse Width: 0.4 ms
Lead Channel Pacing Threshold Pulse Width: 0.8 ms
Lead Channel Sensing Intrinsic Amplitude: 1.375 mV
Lead Channel Sensing Intrinsic Amplitude: 1.375 mV
Lead Channel Sensing Intrinsic Amplitude: 12.125 mV
Lead Channel Sensing Intrinsic Amplitude: 12.125 mV
Lead Channel Setting Pacing Amplitude: 1.5 V
Lead Channel Setting Pacing Amplitude: 2.5 V
Lead Channel Setting Pacing Pulse Width: 0.4 ms
Lead Channel Setting Pacing Pulse Width: 0.8 ms
Lead Channel Setting Sensing Sensitivity: 2 mV

## 2021-10-07 LAB — HM MAMMOGRAPHY

## 2021-10-08 ENCOUNTER — Encounter: Payer: Self-pay | Admitting: Internal Medicine

## 2021-10-12 ENCOUNTER — Other Ambulatory Visit: Payer: Self-pay | Admitting: Internal Medicine

## 2021-10-12 NOTE — Progress Notes (Signed)
Remote pacemaker transmission.   

## 2021-10-18 ENCOUNTER — Ambulatory Visit (INDEPENDENT_AMBULATORY_CARE_PROVIDER_SITE_OTHER): Payer: Medicare Other

## 2021-10-18 DIAGNOSIS — I5022 Chronic systolic (congestive) heart failure: Secondary | ICD-10-CM

## 2021-10-18 DIAGNOSIS — Z950 Presence of cardiac pacemaker: Secondary | ICD-10-CM

## 2021-10-20 ENCOUNTER — Encounter: Payer: Self-pay | Admitting: Internal Medicine

## 2021-10-20 NOTE — Progress Notes (Signed)
EPIC Encounter for ICM Monitoring ? ?Patient Name: Melinda Mckinney is a 66 y.o. female ?Date: 10/20/2021 ?Primary Care Physican: Colon Branch, MD ?Primary Cardiologist: Aundra Dubin ?Electrophysiologist: Lovena Le ?Bi-V Pacing: 99.9%         ?08/26/2021 Office Weight: 210 lbs    ?  ?Time in AT/AF  24.0 hr/day (100.0%)    ?  ?                 ?  ?Transmission reviewed.  ?  ?Optivol thoracic impedance normal but was suggesting intermittent days with possible fluid accumulation in the past month. ?  ?Prescribed:  ?Torsemide 20 mg take 2 tablets (40 mg total) daily.  ?Spironolactone 25 mg take 1 tablet daily ?Farxiga 10 mg take 1 tablet daily ?  ?Labs:  ?08/12/2021 Creatinine 1.85, BUN 43, Potassium 5.1, Sodium 139, GFR 30 ?07/07/2021 Creatinine 2.4,   BUN 63, Potassium 4.8, Sodium 139 ?05/05/2021 Creatinine 1.8,   BUN 48, Potassium 4.7, Sodium 139 ?A complete set of results can be found in Results Review. ?  ?Recommendations:  No changes. ?  ?Follow-up plan: ICM clinic phone appointment on 11/22/2021.   91 day device clinic remote transmission 12/18/2021.   ?  ?EP/Cardiology Office Visits:  Recall 12/11/2021 with [facility] NP/PA.  Recall 03/31/2022 with Tommye Standard, PA.     ?  ?Copy of ICM check sent to Dr. Lovena Le. ? ?3 month ICM trend: 10/18/2021. ? ? ? ?12-14 Month ICM trend:  ? ? ? ?Rosalene Billings, RN ?10/20/2021 ?12:37 PM ? ?

## 2021-10-26 ENCOUNTER — Encounter: Payer: Self-pay | Admitting: Internal Medicine

## 2021-10-26 ENCOUNTER — Ambulatory Visit (HOSPITAL_BASED_OUTPATIENT_CLINIC_OR_DEPARTMENT_OTHER)
Admission: RE | Admit: 2021-10-26 | Discharge: 2021-10-26 | Disposition: A | Discharge status: Home / Self Care | Payer: Medicare Other | Source: Ambulatory Visit | Attending: Internal Medicine | Admitting: Internal Medicine

## 2021-10-26 ENCOUNTER — Other Ambulatory Visit: Payer: Self-pay

## 2021-10-26 ENCOUNTER — Ambulatory Visit (INDEPENDENT_AMBULATORY_CARE_PROVIDER_SITE_OTHER): Payer: Medicare Other | Admitting: Internal Medicine

## 2021-10-26 VITALS — BP 126/84 | HR 88 | Temp 98.2°F | Resp 18 | Ht 65.0 in | Wt 212.0 lb

## 2021-10-26 DIAGNOSIS — I10 Essential (primary) hypertension: Secondary | ICD-10-CM | POA: Diagnosis not present

## 2021-10-26 DIAGNOSIS — Z0000 Encounter for general adult medical examination without abnormal findings: Secondary | ICD-10-CM | POA: Diagnosis not present

## 2021-10-26 DIAGNOSIS — M79642 Pain in left hand: Secondary | ICD-10-CM

## 2021-10-26 DIAGNOSIS — Z0001 Encounter for general adult medical examination with abnormal findings: Secondary | ICD-10-CM

## 2021-10-26 DIAGNOSIS — E1159 Type 2 diabetes mellitus with other circulatory complications: Secondary | ICD-10-CM

## 2021-10-26 DIAGNOSIS — S60012A Contusion of left thumb without damage to nail, initial encounter: Secondary | ICD-10-CM | POA: Diagnosis not present

## 2021-10-26 DIAGNOSIS — W19XXXA Unspecified fall, initial encounter: Secondary | ICD-10-CM

## 2021-10-26 LAB — BASIC METABOLIC PANEL
BUN: 31 mg/dL — ABNORMAL HIGH (ref 6–23)
CO2: 32 mEq/L (ref 19–32)
Calcium: 9.7 mg/dL (ref 8.4–10.5)
Chloride: 103 mEq/L (ref 96–112)
Creatinine, Ser: 1.62 mg/dL — ABNORMAL HIGH (ref 0.40–1.20)
GFR: 32.56 mL/min — ABNORMAL LOW (ref >60.00)
Glucose, Bld: 110 mg/dL — ABNORMAL HIGH (ref 70–99)
Potassium: 4.7 mEq/L (ref 3.5–5.1)
Sodium: 140 mEq/L (ref 135–145)

## 2021-10-26 LAB — HEMOGLOBIN A1C: Hgb A1c MFr Bld: 6.3 % (ref 4.6–6.5)

## 2021-10-26 NOTE — Progress Notes (Signed)
? ?Subjective:  ? ? Patient ID: Melinda Mckinney, female    DOB: May 09, 1956, 66 y.o.   MRN: [id number] ? ?DOS:  10/26/2021 ?Type of visit - description: CPX ? ?In general feels very well except that had a fall 6 days ago: ?It was a mechanical fall at  the parking lot at night.  She fell forward, landed on her left hand, right knee and face. ?No LOC, no chest pain or difficulty breathing. ?She still has some pain at the site of the impact on the left head but no headache per se ?No nausea vomiting ?Still has some pain at the L hand. ? ?Review of Systems ? ?Other than above, a 14 point review of systems is negative  ? ?Past Medical History:  ?Diagnosis Date  ? Anemia   ? Anxiety and depression   ? Asthma   ? Borderline diabetes   ? CAD (coronary artery disease) ~2009  ? stent   ? Cervical cancer ([place])   ? "stage 1; had cryotherapy"  ? CHF (congestive heart failure) ([place])   ? Chronic lower back pain   ? Complete heart block ([place]) 06/2019  ? s/p AV nodal ablation and CRT-P by Dr Lovena Le  ? DDD (degenerative disc disease), lumbar   ? Dr. Nelva Bush, recommends lumbar epidural steroid injections L5-S1 to the right  ? Depression   ? Fibromyalgia   ? GERD (gastroesophageal reflux disease)   ? High cholesterol   ? History of hiatal hernia   ? History of kidney stones   ? Hypertension   ? Hypothyroidism   ? Osteoporosis   ? Permanent atrial fibrillation ([place]) 07/2017  ? s/p AV nodal ablation  ? Thyroid disease   ? ? ?Past Surgical History:  ?Procedure Laterality Date  ? AV NODE ABLATION N/A 06/24/2019  ? Procedure: AV NODE ABLATION;  Surgeon: Evans Lance, MD;  Location: [place] [facility];  Service: Cardiovascular;  Laterality: N/A;  ? BIV PACEMAKER INSERTION CRT-P N/A 06/24/2019  ? Procedure: BIV PACEMAKER INSERTION CRT-P;  Surgeon: Evans Lance, MD;  Location: [place] [facility];  Service: Cardiovascular;  Laterality: N/A;  ? CARDIAC CATHETERIZATION  ~ 2008  ? CARDIOVERSION N/A 08/05/2017  ? Procedure: CARDIOVERSION;  Surgeon:  Evans Lance, MD;  Location: [place];  Service: Cardiovascular;  Laterality: N/A;  ? CARDIOVERSION N/A 08/24/2017  ? Procedure: CARDIOVERSION;  Surgeon: Sanda Klein, MD;  Location: [place];  Service: Cardiovascular;  Laterality: N/A;  ? CARDIOVERSION N/A 06/19/2019  ? Procedure: CARDIOVERSION;  Surgeon: Larey Dresser, MD;  Location: [place];  Service: Cardiovascular;  Laterality: N/A;  ? CARDIOVERSION N/A 06/21/2019  ? Procedure: CARDIOVERSION;  Surgeon: Larey Dresser, MD;  Location: [facility];  Service: Cardiovascular;  Laterality: N/A;  ? [place]; ~ 1978/1979; 1984  ? ESOPHAGOMYOTOMY  2009  ? GYNECOLOGIC CRYOSURGERY  X 2  ? "stage 1 cancer"  ? HERNIA REPAIR  X 6  ? "all in my stomach" (07/27/2017)  ? KNEE ARTHROSCOPY Right   ? NEPHRECTOMY Right 1991  ? in [place], due to fibrosis and lithiasis  ? RIGHT/LEFT HEART CATH AND CORONARY ANGIOGRAPHY N/A 06/17/2019  ? Procedure: RIGHT/LEFT HEART CATH AND CORONARY ANGIOGRAPHY;  Surgeon: Lorretta Harp, MD;  Location: [place] [facility];  Service: Cardiovascular;  Laterality: N/A;  ? TEE WITHOUT CARDIOVERSION N/A 06/19/2019  ? Procedure: TRANSESOPHAGEAL ECHOCARDIOGRAM (TEE);  Surgeon: Larey Dresser, MD;  Location: [place];  Service: Cardiovascular;  Laterality:  N/A;  ? TUBAL LIGATION    ? ?Social History  ? ?Socioeconomic History  ? Marital status: Married  ?  Spouse name: Not on file  ? Number of children: 3  ? Years of education: Not on file  ? Highest education level: Not on file  ?Occupational History  ? Occupation: stay home   ?Tobacco Use  ? Smoking status: Former  ?  Packs/day: 2.00  ?  Years: 28.00  ?  Pack years: 56.00  ?  Types: Cigarettes  ?  Quit date: 2000  ?  Years since quitting: 23.2  ? Smokeless tobacco: Never  ?Vaping Use  ? Vaping Use: Never used  ?Substance and Sexual Activity  ? Alcohol use: Yes  ?  Alcohol/week: 5.0 standard drinks  ?  Types: 5 Cans of beer per week  ?  Comment: 07/27/2017 "3-4 drinks//month"  ? Drug  use: No  ? Sexual activity: Not Currently  ?  Comment: 1ST INTERCOURSE- 61, PARTNERS - 2  ?Other Topics Concern  ? Not on file  ?Social History Narrative  ? Original from [place]  ? 3 children, 2 alive  ? Household --pt and husband   ? ?Social Determinants of Health  ? ?Financial Resource Strain: Not on file  ?Food Insecurity: Not on file  ?Transportation Needs: Not on file  ?Physical Activity: Not on file  ?Stress: Not on file  ?Social Connections: Not on file  ?Intimate Partner Violence: Not on file  ? ? ?Current Outpatient Medications  ?Medication Instructions  ? alendronate (FOSAMAX) 70 mg, Oral, Every 7 days, Take with a full glass of water on an empty stomach.  ? allopurinol (ZYLOPRIM) 100 MG tablet TAKE ONE TABLET BY MOUTH DAILY  ? atorvastatin (LIPITOR) 40 MG tablet TAKE 1 AND 1/2 TABLET BY MOUTH EVERY NIGHT AT BEDTIME  ? carvedilol (COREG) 6.25 MG tablet TAKE ONE TABLET BY MOUTH TWICE A DAY WITH MEALS  ? clobetasol cream (TEMOVATE) 0.05 % No dose, route, or frequency recorded.  ? cyclobenzaprine (FLEXERIL) 10 mg, Oral, 2 times daily PRN  ? DULoxetine (CYMBALTA) 60 MG capsule TAKE 1 CAPSULE BY MOUTH EVERY DAY  ? FARXIGA 10 MG TABS tablet TAKE ONE TABLET BY MOUTH DAILY BEFORE BREAKFAST  ? ferrous fumarate (HEMOCYTE - 106 MG FE) 325 (106 Fe) MG TABS tablet 106 mg of iron, Oral, 2 times daily, Do not take at the same time as omeprazole (Prilosec)  ? ketoconazole (NIZORAL) 2 % cream 1 application., Topical, Daily  ? levothyroxine (SYNTHROID) 112 MCG tablet TAKE ONE TABLET BY MOUTH DAILY BEFORE BREAKFAST  ? metFORMIN (GLUCOPHAGE) 500 MG tablet TAKE ONE TABLET BY MOUTH TWICE A DAY WITH MEALS  ? omeprazole (PRILOSEC) 20 MG capsule TAKE ONE CAPSULE BY MOUTH TWICE A DAY BEFORE A MEAL  ? sacubitril-valsartan (ENTRESTO) 49-51 MG 1 tablet, Oral, 2 times daily  ? spironolactone (ALDACTONE) 12.5 mg, Oral, Daily  ? torsemide (DEMADEX) 40 mg, Oral, Daily  ? Vitamin D3 5,000 Units, Oral, Daily  ? XARELTO 15 MG TABS tablet  TAKE ONE TABLET BY MOUTH DAILY WITH SUPPER  ? ? ?   ?Objective:  ? Physical Exam ?BP 126/84 (BP Location: Left Arm, Patient Position: Sitting, Cuff Size: Normal)   Pulse 88   Temp 98.2 ?F (36.8 ?C) (Oral)   Resp 18   Ht '5\' 5"'$  (1.651 m)   Wt 212 lb (96.2 kg)   SpO2 97%   BMI 35.28 kg/m?  ?General: ?Well developed, NAD, BMI noted ?Neck:  No  thyromegaly  ?HEENT:  ?Normocephalic . Face symmetric, few very superficial scrapes to the L face. ?Lungs:  ?CTA B ?Normal respiratory effort, no intercostal retractions, no accessory muscle use. ?Heart: RRR,  no murmur.  ?Abdomen:  ?Not distended, soft, non-tender. No rebound or rigidity.   ?Lower extremities: no pretibial edema bilaterally ?Upper extremities: ?L wrist and hand: Normal except for #2 metacarpal.  No deformities. ?Skin: Exposed areas without rash. Not pale. Not jaundice ?Neurologic:  ?alert & oriented X3.  ?Speech normal, gait appropriate for age and unassisted ?Strength symmetric and appropriate for age.  ?Psych: ?Cognition and judgment appear intact.  ?Cooperative with normal attention span and concentration.  ?Behavior appropriate. ?No anxious or depressed appearing. ? ?   ?Assessment   ? ? Assessment   ?DM (A1c 6.7 03-2020) ?HTN ?High cholesterol ?Hypothyroidism ?Anxiety depression, insomnia ?CV: ?--CAD: stent ~ 2009 ?--Atrial fibrillation DX 06-2017  ?--06-2019 A. fib, RVR, failed cardioversion,, cath: Normal coronaries, had AV nodal ablation, ICD implanted  ?--Non ischemic cardiomyopathy, DX 06-2019, tachycardia related? ?Obesity  ?Vitamin D deficiency, saw Dr. Cruzita Lederer 07-2016, rtc 1 year, celiac dz test (-), unlikely  Malabsortion; cont supplements  ?GERD ?H/o achalasia, s/p [place] 2009 ?SINGLE KIDNEY---S/p R  Nephrectomy ([place] 1991 due to stones/fibrosis) ?MSK: ?--Fibromyalgia ?--DJD  Knee pain R ?--Back pain -- s/p epidural 10-16 ?Osteoporosis: ?Per Endo as of 10-2020 ?08-2014 T score -2.8, 09-2015: T score -2.7.- ?GYN: ?h/o cervical  dysplasia ?Derm: vulvar pruritus, temovate prn ok per gyn ? ?PLAN: ?Here for CPX ?DM: On metformin, Farxiga, checking A1c and monitoring renal function. ?HTN: BP satisfactory today, check a BMP.  Continue carvedilol, Entrest

## 2021-10-26 NOTE — Patient Instructions (Addendum)
Check the  blood pressure regularly ?BP GOAL is between 110/65 and  135/85. ?If it is consistently higher or lower, let me know ? ?  ? ?GO TO THE LAB : Get the blood work   ? ? ?[place], [place] ?Come back for   a checkup in 6 months ? ?Stop by the first floor and get a x-ray ? ?: Information provided ?Falls can cause injuries and affect people of all ages. There are many simple things that you can do to make your home safe and to help prevent falls. Ask for help when making these changes, if needed. ?What actions can I take to prevent falls? ?General instructions ?Use good lighting in all rooms. Replace any light bulbs that burn out, turn on lights if it is dark, and use night-lights. ?Place frequently used items in easy-to-reach places. Lower the shelves around your home if necessary. ?Set up furniture so that there are clear paths around it. Avoid moving your furniture around. ?Remove throw rugs and other tripping hazards from the floor. ?Avoid walking on wet floors. ?Fix any uneven floor surfaces. ?Add color or contrast paint or tape to grab bars and handrails in your home. Place contrasting color strips on the first and last steps of staircases. ?When you use a stepladder, make sure that it is completely opened and that the sides and supports are firmly locked. Have someone hold the ladder while you are using it. Do not climb a closed stepladder. ?Know where your pets are when moving through your home. ?What can I do in the bathroom? ?  ?Keep the floor dry. Immediately clean up any water that is on the floor. ?Remove soap buildup in the tub or shower regularly. ?Use nonskid mats or decals on the floor of the tub or shower. ?Attach bath mats securely with double-sided, nonslip rug tape. ?If you need to sit down while you are in the shower, use a plastic, nonslip stool. ?Install grab bars by the toilet and in the tub and shower. Do not use towel bars as grab bars. ?What can  I do in the bedroom? ?Make sure that a bedside light is easy to reach. ?Do not use oversized bedding that reaches the floor. ?Have a firm chair that has side arms to use for getting dressed. ?What can I do in the kitchen? ?Clean up any spills right away. ?If you need to reach for something above you, use a sturdy step stool that has a grab bar. ?Keep electrical cables out of the way. ?Do not use floor polish or wax that makes floors slippery. If you must use wax, make sure that it is non-skid floor wax. ?What can I do with my stairs? ?Do not leave any items on the stairs. ?Make sure that you have a light switch at the top and the bottom of the stairs. Have them installed if you do not have them. ?Make sure that there are handrails on both sides of the stairs. Fix handrails that are broken or loose. Make sure that handrails are as long as the staircases. ?Install non-slip stair treads on all stairs in your home. ?Avoid having throw rugs at the top or bottom of stairs, or secure the rugs with carpet tape to prevent them from moving. ?Choose a carpet design that does not hide the edge of steps on the stairs. ?Check any carpeting to make sure that it is firmly attached to the stairs. Fix any carpet that  is loose or worn. ?What can I do on the outside of my home? ?Use bright outdoor lighting. ?Regularly repair the edges of walkways and driveways and fix any cracks. ?Remove high doorway thresholds. ?Trim any shrubbery on the main path into your home. ?Regularly check that handrails are securely fastened and in good repair. Both sides of all steps should have handrails. ?Install guardrails along the edges of any raised decks or porches. ?Clear walkways of debris and clutter, including tools and rocks. ?Have leaves, snow, and ice cleared regularly. ?Use sand or salt on walkways during winter months. ?In the garage, clean up any spills right away, including grease or oil spills. ?What other actions can I take? ?Wear  closed-toe shoes that fit well and support your feet. Wear shoes that have rubber soles or low heels. ?Use mobility aids as needed, such as canes, walkers, scooters, and crutches. ?Review your medicines with your health care provider. Some medicines can cause dizziness or changes in blood pressure, which increase your risk of falling. ?Talk with your health care provider about other ways that you can decrease your risk of falls. This may include working with a physical therapist or trainer to improve your strength, balance, and endurance. ?Where to find more information ?Centers for Disease Control and Prevention, [place]: http://www.wolf.info/ ?[employer] on Aging: http://kim-miller.com/ ?Contact a health care provider if: ?You are afraid of falling at home. ?You feel weak, drowsy, or dizzy at home. ?You fall at home. ?Summary ?There are many simple things that you can do to make your home safe and to help prevent falls. ?Ways to make your home safe include removing tripping hazards and installing grab bars in the bathroom. ?Ask for help when making these changes in your home. ?This information is not intended to replace advice given to you by your health care provider. Make sure you discuss any questions you have with your health care provider. ?Document Revised: 03/04/2020 Document Reviewed: 03/04/2020 ?Elsevier Patient Education ? [place]. ? ?

## 2021-10-27 ENCOUNTER — Encounter: Payer: Self-pay | Admitting: Internal Medicine

## 2021-10-27 NOTE — Assessment & Plan Note (Signed)
Here for CPX ?DM: On metformin, Farxiga, checking A1c and monitoring renal function. ?HTN: BP satisfactory today, check a BMP.  Continue carvedilol, Entresto, Aldactone, torsemide ?Osteoporosis: Saw endo January 2023, on alendronate. ?vitamin D deficiency: Last level was January 2023, normal.   ?Hypothyroidism: Last TSH at Endo normal January 2023 ?Atrial fibrillation, systolic CHF, nonischemic cardiomyopathy: Last visit with cardiology August 12, 2021, ?No changes made ?CKD, last visit w/ renal  November  2022, note reviewed. ?Fall: Mechanical fall, see HPI, no LOC, currently with no headache or nausea.  Has pain at the L hand, will get x-ray, further advise w/ results ?RTC 6 months ?

## 2021-10-27 NOTE — Assessment & Plan Note (Signed)
--  Td: 2015 ?- pnm shot 2015, 2020 ; prevnar 12-28-15 ? -zostavax 12-28-2015 ?- shingrex : recommended  ?-COVID vaccines : rec booster  ?- flu shot: utd  ?--Female care: has an appointment to see  Dr Bethann Goo in [place]. [place] 09-2021 [facility]) ?-- CCS: 11/21/2013: Cscope;  repeated cscope ~ 11-2015: repeat 2 years, had a colonoscopy 2019.  Next per GI (has an appointment to see GI per patient) ?--Available labs reviewed: check a BMP A1c ?--lifestyle: discussed   ?--Fall: Prevention discussed. ?--Advance directives info provided in [place] ?   ? ?  ?

## 2021-11-01 ENCOUNTER — Ambulatory Visit: Payer: Medicare Other | Attending: Internal Medicine

## 2021-11-01 DIAGNOSIS — Z23 Encounter for immunization: Secondary | ICD-10-CM

## 2021-11-01 NOTE — Progress Notes (Signed)
? ?  Covid-19 Vaccination Clinic ? ?Name:  Melinda Mckinney    ?MRN: [id number] ?DOB: 04-30-1956 ? ?11/01/2021 ? ?[name] was observed post Covid-19 immunization for 15 minutes without incident. She was provided with Vaccine Information Sheet and instruction to access the V-Safe system.  ? ?[name] was instructed to call 911 with any severe reactions post vaccine: ?Difficulty breathing  ?Swelling of face and throat  ?A fast heartbeat  ?A bad rash all over body  ?Dizziness and weakness  ? ?Immunizations Administered   ? ? Name Date Dose VIS Date Route  ? Pension scheme manager 11/01/2021  2:33 PM 0.3 mL 04/14/2021 Intramuscular  ? Manufacturer: [place]: [phone]  ? [place]: [phone]  ? ?  ?  ?

## 2021-11-04 DIAGNOSIS — N1832 Chronic kidney disease, stage 3b: Secondary | ICD-10-CM | POA: Diagnosis not present

## 2021-11-04 DIAGNOSIS — Z79899 Other long term (current) drug therapy: Secondary | ICD-10-CM | POA: Diagnosis not present

## 2021-11-04 DIAGNOSIS — L900 Lichen sclerosus et atrophicus: Secondary | ICD-10-CM | POA: Diagnosis not present

## 2021-11-04 LAB — CBC AND DIFFERENTIAL
HCT: 35 — AB (ref 36–46)
Hemoglobin: 11.7 — AB (ref 12.0–16.0)
Neutrophils Absolute: 3.6
Platelets: 281 10*3/uL (ref 150–400)
WBC: 6

## 2021-11-04 LAB — BASIC METABOLIC PANEL
BUN: 27 — AB (ref 4–21)
CO2: 22 (ref 13–22)
Chloride: 105 (ref 99–108)
Creatinine: 1.5 — AB (ref 0.5–1.1)
Glucose: 77
Potassium: 5.2 mEq/L — AB (ref 3.5–5.1)
Sodium: 142 (ref 137–147)

## 2021-11-04 LAB — CBC: RBC: 3.71 — AB (ref 3.87–5.11)

## 2021-11-04 LAB — COMPREHENSIVE METABOLIC PANEL
Albumin: 4 (ref 3.5–5.0)
Calcium: 9.3 (ref 8.7–10.7)
eGFR: 39

## 2021-11-08 DIAGNOSIS — I129 Hypertensive chronic kidney disease with stage 1 through stage 4 chronic kidney disease, or unspecified chronic kidney disease: Secondary | ICD-10-CM | POA: Diagnosis not present

## 2021-11-08 DIAGNOSIS — D631 Anemia in chronic kidney disease: Secondary | ICD-10-CM | POA: Diagnosis not present

## 2021-11-08 DIAGNOSIS — Z905 Acquired absence of kidney: Secondary | ICD-10-CM | POA: Diagnosis not present

## 2021-11-08 DIAGNOSIS — N2581 Secondary hyperparathyroidism of renal origin: Secondary | ICD-10-CM | POA: Diagnosis not present

## 2021-11-08 DIAGNOSIS — N1832 Chronic kidney disease, stage 3b: Secondary | ICD-10-CM | POA: Diagnosis not present

## 2021-11-09 ENCOUNTER — Other Ambulatory Visit: Payer: Self-pay | Admitting: Internal Medicine

## 2021-11-11 ENCOUNTER — Other Ambulatory Visit (HOSPITAL_BASED_OUTPATIENT_CLINIC_OR_DEPARTMENT_OTHER): Payer: Self-pay

## 2021-11-11 ENCOUNTER — Encounter: Payer: Self-pay | Admitting: Internal Medicine

## 2021-11-11 MED ORDER — PFIZER COVID-19 VAC BIVALENT 30 MCG/0.3ML IM SUSP
INTRAMUSCULAR | 0 refills | Status: DC
  Filled 2021-11-11: qty 0.3, 1d supply, fill #0

## 2021-11-22 ENCOUNTER — Other Ambulatory Visit (HOSPITAL_BASED_OUTPATIENT_CLINIC_OR_DEPARTMENT_OTHER): Payer: Self-pay

## 2021-11-22 ENCOUNTER — Ambulatory Visit (INDEPENDENT_AMBULATORY_CARE_PROVIDER_SITE_OTHER): Payer: Medicare Other

## 2021-11-22 DIAGNOSIS — I5022 Chronic systolic (congestive) heart failure: Secondary | ICD-10-CM

## 2021-11-22 DIAGNOSIS — Z950 Presence of cardiac pacemaker: Secondary | ICD-10-CM | POA: Diagnosis not present

## 2021-11-22 MED ORDER — ZOSTER VAC RECOMB ADJUVANTED 50 MCG/0.5ML IM SUSR
INTRAMUSCULAR | 1 refills | Status: DC
  Filled 2021-11-22: qty 1, 1d supply, fill #0

## 2021-11-22 NOTE — Progress Notes (Signed)
EPIC Encounter for ICM Monitoring ? ?Patient Name: Melinda Mckinney is a 66 y.o. female ?Date: 11/22/2021 ?Primary Care Physican: Colon Branch, MD ?Primary Cardiologist: Aundra Dubin ?Electrophysiologist: Lovena Le ?Bi-V Pacing: 99.8%         ?08/26/2021 Office Weight: 210 lbs   ?11/22/2021 Weight: 212 lbs ?  ?  ?Time in AT/AF  24.0 hr/day (100.0%)    ?  ?      Spoke with husband and patient is feeling well but has gained a couple of pounds in the last 2 weeks.   She eats at restaurants daily and does not follow low salt diet.  ?  ?Optivol thoracic impedance suggesting possible fluid accumulation 3/12 but starting to trend toward baseline.  Fluid index > normal threshold starting 3/24. ?  ?Prescribed:  ?Torsemide 20 mg take 2 tablets (40 mg total) daily.  ?Spironolactone 25 mg take 1 tablet daily ?Farxiga 10 mg take 1 tablet daily ?  ?Labs: ?11/04/2021 Creatinine 1.5,   BUN 27, Potassium 5.2, Sodium 142 ?10/26/2021 Creatinine 1.62, BUN 31, Potassium 4.7, Sodium 140  ?08/12/2021 Creatinine 1.85, BUN 43, Potassium 5.1, Sodium 139, GFR 30 ?07/07/2021 Creatinine 2.4,   BUN 63, Potassium 4.8, Sodium 139 ?05/05/2021 Creatinine 1.8,   BUN 48, Potassium 4.7, Sodium 139 ?A complete set of results can be found in Results Review. ?  ?Recommendations:  Copy sent to Dr Aundra Dubin for review and recommendations if needed.  ?  ?Follow-up plan: ICM clinic phone appointment on 11/29/2021 to recheck fluid levels.   91 day device clinic remote transmission 01/06/2022.   ?  ?EP/Cardiology Office Visits:  Recall 12/11/2021 with [facility] NP/PA.  Recall 03/31/2022 with Tommye Standard, PA.     ?  ?Copy of ICM check sent to Dr. Lovena Le. ? ?3 month ICM trend: 11/22/2021. ? ? ? ?12-14 Month ICM trend:  ? ? ? ?Rosalene Billings, RN ?11/22/2021 ?9:34 AM ? ?

## 2021-11-23 NOTE — Progress Notes (Signed)
Attempted call to husband per DPR patient and unable to reach.   ?

## 2021-11-23 NOTE — Progress Notes (Signed)
Increase torsemide to 40 qam/20 qpm x 4 days then back to 40 daily.  ?

## 2021-11-23 NOTE — Progress Notes (Signed)
Spoke with husband per DPR and advised Dr Aundra Dubin recommended pt take Torsemide 40 mg in the morning and 20 mg in the evening x 4 days.  After the 4th day return to taking 40 mg in the morning.  He repeated instructions correctly and agreed to plan. Will recheck fluid levels 4/17.  Marland Kitchen  ?

## 2021-11-25 ENCOUNTER — Telehealth: Payer: Self-pay | Admitting: Internal Medicine

## 2021-11-25 NOTE — Telephone Encounter (Signed)
Left message for patient to call back and schedule Medicare Annual Wellness Visit (AWV) either virtually or phone ? ?[place] 10/14/19 per [place]  ? please schedule at anytime with health coach ? ?This should be a 45 minute visit.  ? ?I left my direct number [phone] ?

## 2021-11-29 ENCOUNTER — Ambulatory Visit (INDEPENDENT_AMBULATORY_CARE_PROVIDER_SITE_OTHER): Payer: Medicare Other

## 2021-11-29 VITALS — BP 91/57 | HR 76 | Ht 66.0 in | Wt 218.4 lb

## 2021-11-29 DIAGNOSIS — Z0000 Encounter for general adult medical examination without abnormal findings: Secondary | ICD-10-CM

## 2021-11-29 DIAGNOSIS — I5022 Chronic systolic (congestive) heart failure: Secondary | ICD-10-CM

## 2021-11-29 DIAGNOSIS — Z950 Presence of cardiac pacemaker: Secondary | ICD-10-CM

## 2021-11-29 NOTE — Patient Instructions (Signed)
Melinda Mckinney , ?Thank you for taking time to come for your Medicare Wellness Visit. I appreciate your ongoing commitment to your health goals. Please review the following plan we discussed and let me know if I can assist you in the future.  ? ?Screening recommendations/referrals: ?Colonoscopy: 10/08/20 due 10/09/23 ?Mammogram: 10/07/21 due 10/07/22 ?Bone Density: 08/26/21 due 08/27/23 ?Recommended yearly ophthalmology/optometry visit for glaucoma screening and checkup ?Recommended yearly dental visit for hygiene and checkup ? ?Vaccinations: ?Influenza vaccine: up to date ?Pneumococcal vaccine: up to date ?Tdap vaccine: up to date ?Shingles vaccine: up to date   ?Covid-19:completed ? ?Advanced directives: no ? ?Conditions/risks identified: see problem list  ? ?Next appointment: Follow up in one year for your annual wellness visit 12/01/22 ? ? ?Preventive Care 68 Years and Older, Female ?Preventive care refers to lifestyle choices and visits with your health care provider that can promote health and wellness. ?What does preventive care include? ?A yearly physical exam. This is also called an annual well check. ?Dental exams once or twice a year. ?Routine eye exams. Ask your health care provider how often you should have your eyes checked. ?Personal lifestyle choices, including: ?Daily care of your teeth and gums. ?Regular physical activity. ?Eating a healthy diet. ?Avoiding tobacco and drug use. ?Limiting alcohol use. ?Practicing safe sex. ?Taking low-dose aspirin every day. ?Taking vitamin and mineral supplements as recommended by your health care provider. ?What happens during an annual well check? ?The services and screenings done by your health care provider during your annual well check will depend on your age, overall health, lifestyle risk factors, and family history of disease. ?Counseling  ?Your health care provider may ask you questions about your: ?Alcohol use. ?Tobacco use. ?Drug use. ?Emotional well-being. ?Home and  relationship well-being. ?Sexual activity. ?Eating habits. ?History of falls. ?Memory and ability to understand (cognition). ?Work and work Statistician. ?Reproductive health. ?Screening  ?You may have the following tests or measurements: ?Height, weight, and BMI. ?Blood pressure. ?Lipid and cholesterol levels. These may be checked every 5 years, or more frequently if you are over 46 years old. ?Skin check. ?Lung cancer screening. You may have this screening every year starting at age 24 if you have a 30-pack-year history of smoking and currently smoke or have quit within the past 15 years. ?Fecal occult blood test (FOBT) of the stool. You may have this test every year starting at age 72. ?Flexible sigmoidoscopy or colonoscopy. You may have a sigmoidoscopy every 5 years or a colonoscopy every 10 years starting at age 29. ?Hepatitis C blood test. ?Hepatitis B blood test. ?Sexually transmitted disease (STD) testing. ?Diabetes screening. This is done by checking your blood sugar (glucose) after you have not eaten for a while (fasting). You may have this done every 1-3 years. ?Bone density scan. This is done to screen for osteoporosis. You may have this done starting at age 27. ?Mammogram. This may be done every 1-2 years. Talk to your health care provider about how often you should have regular mammograms. ?Talk with your health care provider about your test results, treatment options, and if necessary, the need for more tests. ?Vaccines  ?Your health care provider may recommend certain vaccines, such as: ?Influenza vaccine. This is recommended every year. ?Tetanus, diphtheria, and acellular pertussis (Tdap, Td) vaccine. You may need a Td booster every 10 years. ?Zoster vaccine. You may need this after age 23. ?Pneumococcal 13-valent conjugate (PCV13) vaccine. One dose is recommended after age 44. ?Pneumococcal polysaccharide (PPSV23) vaccine. One dose  is recommended after age 3. ?Talk to your health care provider  about which screenings and vaccines you need and how often you need them. ?This information is not intended to replace advice given to you by your health care provider. Make sure you discuss any questions you have with your health care provider. ?Document Released: 08/28/2015 Document Revised: 04/20/2016 Document Reviewed: 06/02/2015 ?Elsevier Interactive Patient Education ? 2017 [employer]. ? ?Fall Prevention in the Home ?Falls can cause injuries. They can happen to people of all ages. There are many things you can do to make your home safe and to help prevent falls. ?What can I do on the outside of my home? ?Regularly fix the edges of walkways and driveways and fix any cracks. ?Remove anything that might make you trip as you walk through a door, such as a raised step or threshold. ?Trim any bushes or trees on the path to your home. ?Use bright outdoor lighting. ?Clear any walking paths of anything that might make someone trip, such as rocks or tools. ?Regularly check to see if handrails are loose or broken. Make sure that both sides of any steps have handrails. ?Any raised decks and porches should have guardrails on the edges. ?Have any leaves, snow, or ice cleared regularly. ?Use sand or salt on walking paths during winter. ?Clean up any spills in your garage right away. This includes oil or grease spills. ?What can I do in the bathroom? ?Use night lights. ?Install grab bars by the toilet and in the tub and shower. Do not use towel bars as grab bars. ?Use non-skid mats or decals in the tub or shower. ?If you need to sit down in the shower, use a plastic, non-slip stool. ?Keep the floor dry. Clean up any water that spills on the floor as soon as it happens. ?Remove soap buildup in the tub or shower regularly. ?Attach bath mats securely with double-sided non-slip rug tape. ?Do not have throw rugs and other things on the floor that can make you trip. ?What can I do in the bedroom? ?Use night lights. ?Make sure  that you have a light by your bed that is easy to reach. ?Do not use any sheets or blankets that are too big for your bed. They should not hang down onto the floor. ?Have a firm chair that has side arms. You can use this for support while you get dressed. ?Do not have throw rugs and other things on the floor that can make you trip. ?What can I do in the kitchen? ?Clean up any spills right away. ?Avoid walking on wet floors. ?Keep items that you use a lot in easy-to-reach places. ?If you need to reach something above you, use a strong step stool that has a grab bar. ?Keep electrical cords out of the way. ?Do not use floor polish or wax that makes floors slippery. If you must use wax, use non-skid floor wax. ?Do not have throw rugs and other things on the floor that can make you trip. ?What can I do with my stairs? ?Do not leave any items on the stairs. ?Make sure that there are handrails on both sides of the stairs and use them. Fix handrails that are broken or loose. Make sure that handrails are as long as the stairways. ?Check any carpeting to make sure that it is firmly attached to the stairs. Fix any carpet that is loose or worn. ?Avoid having throw rugs at the top or bottom of the stairs.  If you do have throw rugs, attach them to the floor with carpet tape. ?Make sure that you have a light switch at the top of the stairs and the bottom of the stairs. If you do not have them, ask someone to add them for you. ?What else can I do to help prevent falls? ?Wear shoes that: ?Do not have high heels. ?Have rubber bottoms. ?Are comfortable and fit you well. ?Are closed at the toe. Do not wear sandals. ?If you use a stepladder: ?Make sure that it is fully opened. Do not climb a closed stepladder. ?Make sure that both sides of the stepladder are locked into place. ?Ask someone to hold it for you, if possible. ?Clearly mark and make sure that you can see: ?Any grab bars or handrails. ?First and last steps. ?Where the edge of  each step is. ?Use tools that help you move around (mobility aids) if they are needed. These include: ?Canes. ?Walkers. ?Scooters. ?Crutches. ?Turn on the lights when you go into a dark area. Replace any l

## 2021-11-29 NOTE — Progress Notes (Signed)
EPIC Encounter for ICM Monitoring ? ?Patient Name: Melinda Mckinney is a 66 y.o. female ?Date: 11/29/2021 ?Primary Care Physican: Colon Branch, MD ?Primary Cardiologist: Aundra Dubin ?Electrophysiologist: Lovena Le ?Bi-V Pacing: 99.8%         ?08/26/2021 Office Weight: 210 lbs   ?11/22/2021 Weight: 212 lbs ?  ?  ?Time in AT/AF  24.0 hr/day (100.0%)    ?  ?      Spoke with husband and heart failure questions reviewed.  Pt asymptomatic for fluid accumulation.   ?  ?Optivol thoracic impedance suggesting fluid levels and fluid index returned to normal after taking Torsemide 40 mg every morning and 20 mg every afternoon for 4 days. ?  ?Prescribed:  ?Torsemide 20 mg take 2 tablets (40 mg total) daily.  ?Spironolactone 25 mg take 1 tablet daily ?Farxiga 10 mg take 1 tablet daily ?  ?Labs: ?11/04/2021 Creatinine 1.5,   BUN 27, Potassium 5.2, Sodium 142 ?10/26/2021 Creatinine 1.62, BUN 31, Potassium 4.7, Sodium 140  ?08/12/2021 Creatinine 1.85, BUN 43, Potassium 5.1, Sodium 139, GFR 30 ?07/07/2021 Creatinine 2.4,   BUN 63, Potassium 4.8, Sodium 139 ?05/05/2021 Creatinine 1.8,   BUN 48, Potassium 4.7, Sodium 139 ?A complete set of results can be found in Results Review. ?  ?Recommendations:  No changes and encouraged to call if experiencing any fluid symptoms. ?  ?Follow-up plan: ICM clinic phone appointment on 12/27/2021.   91 day device clinic remote transmission 01/06/2022.   ?  ?EP/Cardiology Office Visits:  Recall 12/11/2021 with [facility] NP/PA.  Recall 03/31/2022 with Tommye Standard, PA.     ?  ?Copy of ICM check sent to Dr. Lovena Le. ? ?3 month ICM trend: 11/29/2021. ? ? ? ?12-14 Month ICM trend:  ? ? ? ?Rosalene Billings, RN ?11/29/2021 ?10:44 AM ? ?

## 2021-11-29 NOTE — Progress Notes (Addendum)
? ?Subjective:  ? Melinda Mckinney is a 66 y.o. female who presents for an Initial Medicare Annual Wellness Visit. ? ? ?Review of Systems    ? ?Cardiac Risk Factors include: advanced age (>57mn, >>71women);diabetes mellitus;dyslipidemia;hypertension ? ?   ?Objective:  ?  ?Today's Vitals  ? 11/29/21 1350  ?BP: (!) 91/57  ?Pulse: 76  ?SpO2: 96%  ?Weight: 218 lb 6.4 oz (99.1 kg)  ?Height: '5\' 6"'$  (1.676 m)  ? ?Body mass index is 35.25 kg/m?. ? ? ?  11/29/2021  ?  1:56 PM 03/24/2021  ? 10:29 AM 06/11/2019  ?  9:58 AM 05/25/2019  ?  4:00 AM 05/24/2019  ?  8:56 PM 05/24/2019  ?  8:53 PM 05/01/2019  ?  7:53 AM  ?Advanced Directives  ?Does Patient Have a Medical Advance Directive? No No No No No No   ?Would patient like information on creating a medical advance directive? No - Patient declined  No - Patient declined No - Patient declined   No - Patient declined  ? ? ?Current Medications (verified) ?Outpatient Encounter Medications as of 11/29/2021  ?Medication Sig  ? alendronate (FOSAMAX) 70 MG tablet Take 1 tablet (70 mg total) by mouth every 7 (seven) days. Take with a full glass of water on an empty stomach.  ? allopurinol (ZYLOPRIM) 100 MG tablet TAKE ONE TABLET BY MOUTH DAILY  ? atorvastatin (LIPITOR) 40 MG tablet TAKE 1 AND 1/2 TABLET BY MOUTH EVERY NIGHT AT BEDTIME  ? carvedilol (COREG) 6.25 MG tablet TAKE ONE TABLET BY MOUTH TWICE A DAY WITH MEALS  ? Cholecalciferol (VITAMIN D3) 5000 UNITS CAPS Take 5,000 Units by mouth daily.   ? clobetasol cream (TEMOVATE) 0.05 %   ? COVID-19 mRNA bivalent vaccine, Pfizer, (PFIZER COVID-19 VAC BIVALENT) injection Inject into the muscle.  ? cyclobenzaprine (FLEXERIL) 10 MG tablet Take 1 tablet (10 mg total) by mouth 2 (two) times daily as needed for muscle spasms.  ? DULoxetine (CYMBALTA) 60 MG capsule TAKE 1 CAPSULE BY MOUTH EVERY DAY  ? FARXIGA 10 MG TABS tablet TAKE ONE TABLET BY MOUTH DAILY BEFORE BREAKFAST  ? ferrous fumarate (HEMOCYTE - 106 MG FE) 325 (106 Fe) MG TABS tablet Take 1  tablet (106 mg of iron total) by mouth 2 (two) times daily. Do not take at the same time as omeprazole (Prilosec)  ? ketoconazole (NIZORAL) 2 % cream Apply 1 application topically daily.  ? levothyroxine (SYNTHROID) 112 MCG tablet TAKE ONE TABLET BY MOUTH DAILY BEFORE BREAKFAST  ? metFORMIN (GLUCOPHAGE) 500 MG tablet TAKE ONE TABLET BY MOUTH TWICE A DAY WITH MEALS  ? omeprazole (PRILOSEC) 20 MG capsule TAKE ONE CAPSULE BY MOUTH TWICE A DAY BEFORE A MEAL  ? sacubitril-valsartan (ENTRESTO) 49-51 MG Take 1 tablet by mouth 2 (two) times daily.  ? spironolactone (ALDACTONE) 25 MG tablet Take 0.5 tablets (12.5 mg total) by mouth daily.  ? torsemide (DEMADEX) 20 MG tablet Take 2 tablets (40 mg total) by mouth daily.  ? XARELTO 15 MG TABS tablet TAKE ONE TABLET BY MOUTH DAILY WITH SUPPER  ? Zoster Vaccine Adjuvanted ([facility] injection Inject into the muscle.  ? ?No facility-administered encounter medications on file as of 11/29/2021.  ? ? ?Allergies (verified) ?Penicillins  ? ?History: ?Past Medical History:  ?Diagnosis Date  ? Anemia   ? Anxiety and depression   ? Asthma   ? Borderline diabetes   ? CAD (coronary artery disease) ~2009  ? stent   ? Cervical cancer ([place]   ? "  stage 1; had cryotherapy"  ? CHF (congestive heart failure) ([place])   ? Chronic lower back pain   ? Complete heart block ([place]) 06/2019  ? s/p AV nodal ablation and CRT-P by Dr Lovena Le  ? DDD (degenerative disc disease), lumbar   ? Dr. Nelva Bush, recommends lumbar epidural steroid injections L5-S1 to the right  ? Depression   ? Fibromyalgia   ? GERD (gastroesophageal reflux disease)   ? High cholesterol   ? History of hiatal hernia   ? History of kidney stones   ? Hypertension   ? Hypothyroidism   ? Osteoporosis   ? Permanent atrial fibrillation ([place]) 07/2017  ? s/p AV nodal ablation  ? Thyroid disease   ? ?Past Surgical History:  ?Procedure Laterality Date  ? AV NODE ABLATION N/A 06/24/2019  ? Procedure: AV NODE ABLATION;  Surgeon: Evans Lance, MD;   Location: [place] [facility];  Service: Cardiovascular;  Laterality: N/A;  ? BIV PACEMAKER INSERTION CRT-P N/A 06/24/2019  ? Procedure: BIV PACEMAKER INSERTION CRT-P;  Surgeon: Evans Lance, MD;  Location: [place] [facility];  Service: Cardiovascular;  Laterality: N/A;  ? CARDIAC CATHETERIZATION  ~ 2008  ? CARDIOVERSION N/A 08/05/2017  ? Procedure: CARDIOVERSION;  Surgeon: Evans Lance, MD;  Location: [place];  Service: Cardiovascular;  Laterality: N/A;  ? CARDIOVERSION N/A 08/24/2017  ? Procedure: CARDIOVERSION;  Surgeon: Sanda Klein, MD;  Location: [place];  Service: Cardiovascular;  Laterality: N/A;  ? CARDIOVERSION N/A 06/19/2019  ? Procedure: CARDIOVERSION;  Surgeon: Larey Dresser, MD;  Location: [place];  Service: Cardiovascular;  Laterality: N/A;  ? CARDIOVERSION N/A 06/21/2019  ? Procedure: CARDIOVERSION;  Surgeon: Larey Dresser, MD;  Location: [facility] END[facility];  Service: Cardiovascular;  Laterality: N/A;  ? [place]; ~ 1978/1979; 1984  ? ESOPHAGOMYOTOMY  2009  ? GYNECOLOGIC CRYOSURGERY  X 2  ? "stage 1 cancer"  ? HERNIA REPAIR  X 6  ? "all in my stomach" (07/27/2017)  ? KNEE ARTHROSCOPY Right   ? NEPHRECTOMY Right 1991  ? in [place], due to fibrosis and lithiasis  ? RIGHT/LEFT HEART CATH AND CORONARY ANGIOGRAPHY N/A 06/17/2019  ? Procedure: RIGHT/LEFT HEART CATH AND CORONARY ANGIOGRAPHY;  Surgeon: Lorretta Harp, MD;  Location: [place] [facility];  Service: Cardiovascular;  Laterality: N/A;  ? TEE WITHOUT CARDIOVERSION N/A 06/19/2019  ? Procedure: TRANSESOPHAGEAL ECHOCARDIOGRAM (TEE);  Surgeon: Larey Dresser, MD;  Location: [place];  Service: Cardiovascular;  Laterality: N/A;  ? TUBAL LIGATION    ? ?Family History  ?Problem Relation Age of Onset  ? Breast cancer Other   ?     aunt   ? CAD Brother   ? Lung cancer Mother   ?     F and M  ? Diabetes Father   ?     F and mother   ? CAD Father   ? Lung cancer Father   ? Stroke Sister   ? Colon cancer Neg Hx   ? ?Social History   ? ?Socioeconomic History  ? Marital status: Married  ?  Spouse name: Not on file  ? Number of children: 3  ? Years of education: Not on file  ? Highest education level: Not on file  ?Occupational History  ? Occupation: stay home   ?Tobacco Use  ? Smoking status: Former  ?  Packs/day: 2.00  ?  Years: 28.00  ?  Pack years: 56.00  ?  Types: Cigarettes  ?  Quit date: 2000  ?  Years since quitting: 23.3  ? Smokeless tobacco: Never  ?Vaping Use  ? Vaping Use: Never used  ?Substance and Sexual Activity  ? Alcohol use: Yes  ?  Alcohol/week: 5.0 standard drinks  ?  Types: 5 Cans of beer per week  ?  Comment: 07/27/2017 "3-4 drinks//month"  ? Drug use: No  ? Sexual activity: Not Currently  ?  Comment: 1ST INTERCOURSE- 52, PARTNERS - 2  ?Other Topics Concern  ? Not on file  ?Social History Narrative  ? Original from [place]  ? 3 children, 2 alive  ? Household --pt and husband   ? ?Social Determinants of Health  ? ?Financial Resource Strain: Low Risk   ? Difficulty of Paying Living Expenses: Not hard at all  ?Food Insecurity: No Food Insecurity  ? Worried About Charity fundraiser in the Last Year: Never true  ? Ran Out of Food in the Last Year: Never true  ?Transportation Needs: Unknown  ? Lack of Transportation (Medical): No  ? Lack of Transportation (Non-Medical): Not on file  ?Physical Activity: Inactive  ? Days of Exercise per Week: 0 days  ? Minutes of Exercise per Session: 0 min  ?Stress: No Stress Concern Present  ? Feeling of Stress : Not at all  ?Social Connections: Moderately Integrated  ? Frequency of Communication with Friends and Family: More than three times a week  ? Frequency of Social Gatherings with Friends and Family: More than three times a week  ? Attends Religious Services: Never  ? Active Member of Clubs or Organizations: Yes  ? Attends Archivist Meetings: More than 4 times per year  ? Marital Status: Married  ? ? ?Tobacco Counseling ?Counseling given: Not Answered ? ? ?Clinical  Intake: ? ?Pre-visit preparation completed: Yes ? ?Pain : No/denies pain ? ?  ? ?BMI - recorded: 35.25 ?Nutritional Status: BMI > 30  Obese ?Nutritional Risks: None ?Diabetes: Yes ?CBG done?: No ?Did pt. bring in CBG monitor fr

## 2021-12-15 ENCOUNTER — Other Ambulatory Visit (HOSPITAL_COMMUNITY): Payer: Self-pay

## 2021-12-15 DIAGNOSIS — I5022 Chronic systolic (congestive) heart failure: Secondary | ICD-10-CM

## 2021-12-15 MED ORDER — DAPAGLIFLOZIN PROPANEDIOL 10 MG PO TABS: ORAL_TABLET | ORAL | 4 refills | Status: DC

## 2021-12-27 ENCOUNTER — Ambulatory Visit (INDEPENDENT_AMBULATORY_CARE_PROVIDER_SITE_OTHER): Payer: Medicare Other

## 2021-12-27 DIAGNOSIS — I5022 Chronic systolic (congestive) heart failure: Secondary | ICD-10-CM

## 2021-12-27 DIAGNOSIS — Z950 Presence of cardiac pacemaker: Secondary | ICD-10-CM

## 2021-12-27 NOTE — Progress Notes (Signed)
EPIC Encounter for ICM Monitoring  Patient Name: Melinda Mckinney is a 66 y.o. female Date: 12/27/2021 Primary Care Physican: Colon Branch, MD Primary Cardiologist: Aundra Dubin Electrophysiologist: Santina Evans Pacing: 99.9%         08/26/2021 Office Weight: 210 lbs   11/22/2021 Weight: 212 lbs 12/27/2021 Weight: 212 lbs     Time in AT/AF  24.0 hr/day (100.0%)             Spoke with patient and heart failure questions reviewed.  Pt has swelling in feet.  Pt leaves for a 2 week vacation to [place] on Wed 5/17.       Optivol thoracic impedance suggesting possible fluid accumulation starting 4/24.   Prescribed:  Torsemide 20 mg take 2 tablets (40 mg total) daily.  Spironolactone 25 mg take 1 tablet daily Farxiga 10 mg take 1 tablet daily   Labs: 11/04/2021 Creatinine 1.5,   BUN 27, Potassium 5.2, Sodium 142 10/26/2021 Creatinine 1.62, BUN 31, Potassium 4.7, Sodium 140  08/12/2021 Creatinine 1.85, BUN 43, Potassium 5.1, Sodium 139, GFR 30 07/07/2021 Creatinine 2.4,   BUN 63, Potassium 4.8, Sodium 139 05/05/2021 Creatinine 1.8,   BUN 48, Potassium 4.7, Sodium 139 A complete set of results can be found in Results Review.   Recommendations:   Copy sent to Dr Aundra Dubin regarding current Optivol and if patient should plan on taking extra Torsemide if she has symptoms during 2 week vacation   Follow-up plan: ICM clinic phone appointment on 01/17/2022 to recheck fluid levels after returning from vacation.   91 day device clinic remote transmission 01/06/2022.     EP/Cardiology Office Visits:  Recall 12/11/2021 with [facility] NP/PA.  Recall 03/31/2022 with Tommye Standard, PA.       Copy of ICM check sent to Dr. Lovena Le.     3 month ICM trend: 12/27/2021.    12-14 Month ICM trend:     Rosalene Billings, RN 12/27/2021 1:01 PM

## 2021-12-28 NOTE — Progress Notes (Signed)
Attempted call to husband and unable to reach.  Left message that have not received any recommendations before patient leaves for [place] tomorrow.  Advised will recheck fluid levels upon her return home.

## 2021-12-28 NOTE — Progress Notes (Signed)
Increase torsemide to 40 qam/20 qpm x 3 days, then take the pm torsemide every other day. BMET when she returns.  ?

## 2021-12-29 NOTE — Progress Notes (Signed)
Attempted call to husband and unable to reach.  Left message to return call.

## 2021-12-31 NOTE — Progress Notes (Signed)
Unable to reach husband/patient due to patient left for [place] for 2 weeks.  Will recheck fluid levels upon returning.

## 2022-01-05 ENCOUNTER — Other Ambulatory Visit: Payer: Self-pay | Admitting: Internal Medicine

## 2022-01-17 ENCOUNTER — Ambulatory Visit (INDEPENDENT_AMBULATORY_CARE_PROVIDER_SITE_OTHER): Payer: Medicare Other

## 2022-01-17 DIAGNOSIS — I428 Other cardiomyopathies: Secondary | ICD-10-CM

## 2022-01-17 DIAGNOSIS — Z950 Presence of cardiac pacemaker: Secondary | ICD-10-CM

## 2022-01-17 DIAGNOSIS — I5022 Chronic systolic (congestive) heart failure: Secondary | ICD-10-CM

## 2022-01-17 LAB — CUP PACEART REMOTE DEVICE CHECK
Battery Remaining Longevity: 92 mo
Battery Voltage: 2.98 V
Brady Statistic RA Percent Paced: 0 %
Brady Statistic RV Percent Paced: 99.8 %
Date Time Interrogation Session: 20230605002000
Implantable Lead Implant Date: 20201109
Implantable Lead Implant Date: 20201109
Implantable Lead Implant Date: 20201109
Implantable Lead Location: 753858
Implantable Lead Location: 753859
Implantable Lead Location: 753860
Implantable Lead Model: 4598
Implantable Lead Model: 5076
Implantable Lead Model: 5076
Implantable Pulse Generator Implant Date: 20201109
Lead Channel Impedance Value: 1026 Ohm
Lead Channel Impedance Value: 1083 Ohm
Lead Channel Impedance Value: 1273 Ohm
Lead Channel Impedance Value: 1349 Ohm
Lead Channel Impedance Value: 380 Ohm
Lead Channel Impedance Value: 456 Ohm
Lead Channel Impedance Value: 475 Ohm
Lead Channel Impedance Value: 494 Ohm
Lead Channel Impedance Value: 513 Ohm
Lead Channel Impedance Value: 703 Ohm
Lead Channel Impedance Value: 722 Ohm
Lead Channel Impedance Value: 760 Ohm
Lead Channel Impedance Value: 817 Ohm
Lead Channel Impedance Value: 969 Ohm
Lead Channel Pacing Threshold Amplitude: 0.5 V
Lead Channel Pacing Threshold Amplitude: 1.25 V
Lead Channel Pacing Threshold Pulse Width: 0.4 ms
Lead Channel Pacing Threshold Pulse Width: 0.8 ms
Lead Channel Sensing Intrinsic Amplitude: 1 mV
Lead Channel Sensing Intrinsic Amplitude: 1 mV
Lead Channel Sensing Intrinsic Amplitude: 16.5 mV
Lead Channel Sensing Intrinsic Amplitude: 16.5 mV
Lead Channel Setting Pacing Amplitude: 1.75 V
Lead Channel Setting Pacing Amplitude: 2.5 V
Lead Channel Setting Pacing Pulse Width: 0.4 ms
Lead Channel Setting Pacing Pulse Width: 0.8 ms
Lead Channel Setting Sensing Sensitivity: 2 mV

## 2022-01-18 NOTE — Progress Notes (Signed)
EPIC Encounter for ICM Monitoring  Patient Name: Melinda Mckinney is a 66 y.o. female Date: 01/18/2022 Primary Care Physican: Colon Branch, MD Primary Cardiologist: Aundra Dubin Electrophysiologist: Santina Evans Pacing: 99.9%         08/26/2021 Office Weight: 210 lbs   11/22/2021 Weight: 212 lbs 12/27/2021 Weight: 212 lbs     Time in AT/AF  24.0 hr/day (100.0%)             Spoke with husband.  Pt currently has the flu that started about 4 days ago which correlates with decreased impedance.  Unable to provide May recommendations to change Torsemide dosage due to patient was out of the country [place] 2 weeks.     Optivol thoracic impedance suggesting possible fluid accumulation starting 5/30.  Fluid Index returned to < normal threshold.   Prescribed:  Torsemide 20 mg take 2 tablets (40 mg total) daily.  Spironolactone 25 mg take 1 tablet daily Farxiga 10 mg take 1 tablet daily   Labs: 11/04/2021 Creatinine 1.5,   BUN 27, Potassium 5.2, Sodium 142 10/26/2021 Creatinine 1.62, BUN 31, Potassium 4.7, Sodium 140  08/12/2021 Creatinine 1.85, BUN 43, Potassium 5.1, Sodium 139, GFR 30 07/07/2021 Creatinine 2.4,   BUN 63, Potassium 4.8, Sodium 139 05/05/2021 Creatinine 1.8,   BUN 48, Potassium 4.7, Sodium 139 A complete set of results can be found in Results Review.   Recommendations:  Will recheck fluid levels.   Follow-up plan: ICM clinic phone appointment on 01/31/2022.   91 day device clinic remote transmission 04/19/2022.     EP/Cardiology Office Visits:  Recall 12/11/2021 with [facility] NP/PA.  Recall 03/31/2022 with Tommye Standard, PA.       Copy of ICM check sent to Dr. Lovena Le.    3 month ICM trend: 01/17/2022.    12-14 Month ICM trend:     Rosalene Billings, RN 01/18/2022 9:45 AM

## 2022-01-31 ENCOUNTER — Ambulatory Visit (INDEPENDENT_AMBULATORY_CARE_PROVIDER_SITE_OTHER): Payer: Medicare Other

## 2022-01-31 ENCOUNTER — Telehealth: Payer: Self-pay

## 2022-01-31 DIAGNOSIS — Z950 Presence of cardiac pacemaker: Secondary | ICD-10-CM | POA: Diagnosis not present

## 2022-01-31 DIAGNOSIS — I5022 Chronic systolic (congestive) heart failure: Secondary | ICD-10-CM

## 2022-01-31 NOTE — Progress Notes (Signed)
Spoke with husband and provided recommendations from Allena Katz, NP at [facility] clinic.  Advised to take torsemide to 40 mg twice a day x 3 days, then back to 40 mg daily.   Advised will need to add Potassium 20 mEq 1 tablet x 3 days.  He reports she has a Potassium prescription at home that was stopped previously.  Advised to carefully check the Potassium prescription bottle and if any questions to call back.  He stated he would do so.  Scheduled BMET 6/28 at 10:00 AM. He verbalized understanding of recommendations and repeated instructions back correctly

## 2022-01-31 NOTE — Progress Notes (Signed)
Attempted call to husband to provide HF Clinic Recommendations and left message to return call.

## 2022-01-31 NOTE — Progress Notes (Signed)
EPIC Encounter for ICM Monitoring  Patient Name: Melinda Mckinney is a 66 y.o. female Date: 01/31/2022 Primary Care Physican: Colon Branch, MD Primary Cardiologist: Aundra Dubin Electrophysiologist: Santina Evans Pacing: 99.9%         08/26/2021 Office Weight: 210 lbs   11/22/2021 Weight: 212 lbs 12/27/2021 Weight: 212 lbs 01/31/2022 Weight: 216 lbs   Time in AT/AF  24.0 hr/day (100.0%)             Spoke with husband and heart failure questions reviewed.  Pt reports weight gain, swelling in her a feet/legs, headache and tiredness.    Optivol thoracic impedance suggesting possible fluid accumulation starting 5/30.  Fluid Index returned to < normal threshold.   Prescribed:  Torsemide 20 mg take 2 tablets (40 mg total) daily.  Spironolactone 25 mg take 1 tablet daily Farxiga 10 mg take 1 tablet daily   Labs: 11/04/2021 Creatinine 1.5,   BUN 27, Potassium 5.2, Sodium 142 10/26/2021 Creatinine 1.62, BUN 31, Potassium 4.7, Sodium 140  08/12/2021 Creatinine 1.85, BUN 43, Potassium 5.1, Sodium 139, GFR 30 07/07/2021 Creatinine 2.4,   BUN 63, Potassium 4.8, Sodium 139 05/05/2021 Creatinine 1.8,   BUN 48, Potassium 4.7, Sodium 139 A complete set of results can be found in Results Review.   Recommendations:  Sent to Allena Katz, NP at [facility] clinic (Dr Aundra Dubin not in office) for review and recommendations.   Follow-up plan: ICM clinic phone appointment on 02/07/2022 to recheck fluid levels.   91 day device clinic remote transmission 04/19/2022.     EP/Cardiology Office Visits:  Advise to call HF clinic for 6 month follow up.   Recall 12/11/2021 with [facility] NP/PA.  Recall 03/31/2022 with Tommye Standard, PA.       Copy of ICM check sent to Dr. Lovena Le.   3 month ICM trend: 01/31/2022.    12-14 Month ICM trend:     Rosalene Billings, RN 01/31/2022 10:39 AM

## 2022-01-31 NOTE — Telephone Encounter (Signed)
Remote ICM transmission received.  Attempted call to patient regarding ICM remote transmission and left detailed message per DPR.

## 2022-01-31 NOTE — Progress Notes (Signed)
Received: Today [place], Maricela Bo, FNP  Khayman Kirsch Panda, RN She can increase torsemide to 40 mg bid x 3 days, then back to 40 mg daily.   She will need to take 20 KCL x 3 days only with increased torsemide dose. Please arrange for BMET in 7-10 days.

## 2022-02-02 NOTE — Progress Notes (Signed)
Remote pacemaker transmission.   

## 2022-02-07 ENCOUNTER — Ambulatory Visit (INDEPENDENT_AMBULATORY_CARE_PROVIDER_SITE_OTHER): Payer: Medicare Other

## 2022-02-07 DIAGNOSIS — I5022 Chronic systolic (congestive) heart failure: Secondary | ICD-10-CM

## 2022-02-07 DIAGNOSIS — Z950 Presence of cardiac pacemaker: Secondary | ICD-10-CM

## 2022-02-09 ENCOUNTER — Ambulatory Visit (HOSPITAL_COMMUNITY)
Admission: RE | Admit: 2022-02-09 | Discharge: 2022-02-09 | Disposition: A | Discharge status: Home / Self Care | Payer: Medicare Other | Source: Ambulatory Visit | Attending: Cardiology | Admitting: Cardiology

## 2022-02-09 DIAGNOSIS — I428 Other cardiomyopathies: Secondary | ICD-10-CM | POA: Diagnosis not present

## 2022-02-09 LAB — BASIC METABOLIC PANEL
Anion gap: 9 (ref 5–15)
BUN: 49 mg/dL — ABNORMAL HIGH (ref 8–23)
CO2: 25 mmol/L (ref 22–32)
Calcium: 9.9 mg/dL (ref 8.9–10.3)
Chloride: 105 mmol/L (ref 98–111)
Creatinine, Ser: 1.87 mg/dL — ABNORMAL HIGH (ref 0.44–1.00)
GFR, Estimated: 29 mL/min — ABNORMAL LOW (ref >60)
Glucose, Bld: 117 mg/dL — ABNORMAL HIGH (ref 70–99)
Potassium: 4.6 mmol/L (ref 3.5–5.1)
Sodium: 139 mmol/L (ref 135–145)

## 2022-02-24 ENCOUNTER — Other Ambulatory Visit (HOSPITAL_COMMUNITY): Payer: Self-pay | Admitting: Cardiology

## 2022-03-01 NOTE — Progress Notes (Signed)
PCP: Colon Branch, MD  Nephrology: Dr. Candiss Norse HF Cardiology: Dr. Aundra Dubin  66 y.o. with CAD s/p PCI in 2010, chronic combined systolic and diastolic HF (EF 82-50% in 2018), atrial fibrillation on chronic anticoagulation w/ Xarelto, HTN, and hypothyroidism was diagnosed w/ COVID-19 in Sep 2020 and treated at [facility] infection c/b PNA. She was discharged and readmitted back to [facility] 10/9-10/13/20 for gastroenteritis, felt to be a sequela of COVID-19. She was managed w/ supportive care and discharged home. Had f/u with PCP 10/22 and endorsed worsening LLQ pain leading to abdominal CT which showed acute diverticulitis. She was started on outpatient antibiotics, cipro + flagyl but symptoms failed to improve, prompting her to seek emergency medical care at [facility].  Initial EKG in the ED showed afib w/ RVR for which general cardiology was consulted. She was started on IV dilt for rate control but later required treatment w/ IV amiodarone due to persistent rapid ventricular response. Hospital course c/b volume overload, requiring IV diuretics. 2D echo was obtained and showed worsening LVEF, now 25-30% w/ moderate MR. She underwent subsequent R/LHC in 11/20 which demonstrated widely patent coronaries. RHC showed elevated mean RA pressure at 22, PWP 43, LVEDP 45 and low CO. CI by Fick 1.6. She underwent TEE-guided DCCV, TEE showed EF 15% with moderate RV dysfunction and moderate MR.  She went back into rapid atrial fibrillation.  She failed additional cardioversions.  Despite amiodarone gtt, HR remained in the 140s-150s.  Finally, she underwent AV nodal ablation with BiV pacing due to inability to control atrial fibrillation. While in the hospital, she was on milrinone gtt for a period of time, but we were able to taper it off. She was discharged home after medication optimization.   Echo in 2/21 showed EF up to 40-45%, mild diffuse hypokinesis, RV low normal function.  Echo in 3/22 showed EF  55-60%, RV normal, severe LAE.    Today she returns for HF follow up with her husband and interpretor. Overall feeling fine. She does not have dyspnea with walking on flat ground or housework, she is SOB walking up stairs. Main complaint is fatigue and poor sleep. Denies palpitations, abnormal bleeding,  CP, dizziness, edema, or PND/Orthopnea. Appetite ok. No fever or chills. Weight at home stable. Taking all medications. No falls. Followed by Dr. Candiss Norse with [facility].  Medtronic device interrogation: fluid index < threshold, stable thoracic impedance, 4 hrs/day activity.  Labs (11/20): digoxin level 1.4, creatinine 1.81, K 4.8, hgb 13.9 Labs (1/21): LDL 91, K 3.7, creatinine 1.32 Labs (2/21): K 5.1, creatinine 1.37, TSH normal, digoxin 0.4 Labs (5/21): LDL 68, digoxin 0.3 Labs (6/21): K 4.4, creatinine 1.21 Labs (1/22): K 4, creatinine 1.65, TSH normal Labs (7/22): LDL 78, K 4.3, creatinine 1.84 Labs (11/22): K 4.8, creatinine 2.4 Labs (3/23): hgb 11.1 Labs (6/23): K 4.6, creatinine 1.87  PMH: 1. Hypothyroidism 2. Single kidney s/p nephrectomy 3. Atrial fibrillation: Initially diagnosed in 2018.  She was on sotalol at one point to maintain NSR.  - Uncontrollable atrial fibrillation during 11/20 admission, patient eventually had AV nodal ablation with CRT-P implantation.  4. H/o achalasia 5. GERD 6. Fibromyalgia 7. H/o diverticulitis in 10/20 8. COVID-19 in 9/20.  9. H/o cervical cancer: cryotherapy.  10. Degenerative disc disease.  11. Chronic systolic CHF: Nonischemic cardiomyopathy, possibly tachycardia-mediated in setting of uncontrolled atrial fibrillation. Medtronic CRT-P device.  - Echo (2018): EF 45-50%.  - Echo (11/20): EF 25-30%, mildly decreased RV systolic function, moderate MR.  -  TEE (11/20): EF 15%, moderately decreased RV systolic function, moderate MR.  - Cardiac MRI (11/20): EF 34%, RV EF 30%, no LGE (done after AV nodal ablation and BiV pacing).  - RHC (11/20): mean RA  22, mean PCWP 43, LVEDP 45, CI 1.6 (Fick) and 2.7 (thermodilution).  - Echo (2/21): EF 40-45%, mild diffuse hypokinesis, low normal RV systolic function.  - Echo (5/22): EF 55-60%, RV normal, severe LAE 12. CAD: PCI in 2010.  - LHC (11/20): No significant coronary disease.   Social History   Socioeconomic History   Marital status: Married    Spouse name: Not on file   Number of children: 3   Years of education: Not on file   Highest education level: Not on file  Occupational History   Occupation: stay home   Tobacco Use   Smoking status: Former    Packs/day: 2.00    Years: 28.00    Total pack years: 56.00    Types: Cigarettes    Quit date: 2000    Years since quitting: 23.5   Smokeless tobacco: Never  Vaping Use   Vaping Use: Never used  Substance and Sexual Activity   Alcohol use: Yes    Alcohol/week: 5.0 standard drinks of alcohol    Types: 5 Cans of beer per week    Comment: 07/27/2017 "3-4 drinks//month"   Drug use: No   Sexual activity: Not Currently    Comment: 1ST INTERCOURSE- 16, PARTNERS - 2  Other Topics Concern   Not on file  Social History Narrative   Original from [place]   3 children, 2 alive   Household --pt and husband    Social Determinants of Health   Financial Resource Strain: Low Risk  (11/29/2021)   Overall Financial Resource Strain (CARDIA)    Difficulty of Paying Living Expenses: Not hard at all  Food Insecurity: No Food Insecurity (11/29/2021)   Hunger Vital Sign    Worried About Running Out of Food in the Last Year: Never true    [place] in the Last Year: Never true  Transportation Needs: Unknown (11/29/2021)   PRAPARE - Hydrologist (Medical): No    Lack of Transportation (Non-Medical): Not on file  Physical Activity: Inactive (11/29/2021)   Exercise Vital Sign    Days of Exercise per Week: 0 days    Minutes of Exercise per Session: 0 min  Stress: No Stress Concern Present (11/29/2021)   [place]    Feeling of Stress : Not at all  Social Connections: Moderately Integrated (11/29/2021)   Social Connection and Isolation Panel [NHANES]    Frequency of Communication with Friends and Family: More than three times a week    Frequency of Social Gatherings with Friends and Family: More than three times a week    Attends Religious Services: Never    Marine scientist or Organizations: Yes    Attends Music therapist: More than 4 times per year    Marital Status: Married  Human resources officer Violence: Not At Risk (11/29/2021)   Humiliation, Afraid, Rape, and Kick questionnaire    Fear of Current or Ex-Partner: No    Emotionally Abused: No    Physically Abused: No    Sexually Abused: No   Family History  Problem Relation Age of Onset   Breast cancer Other        aunt    CAD  Brother    Lung cancer Mother        F and M   Diabetes Father        F and mother    CAD Father    Lung cancer Father    Stroke Sister    Colon cancer Neg Hx    ROS: All systems reviewed and negative except as per HPI.   Current Outpatient Medications  Medication Sig Dispense Refill   alendronate (FOSAMAX) 70 MG tablet Take 1 tablet (70 mg total) by mouth every 7 (seven) days. Take with a full glass of water on an empty stomach. 12 tablet 3   allopurinol (ZYLOPRIM) 100 MG tablet TAKE 1 TABLET BY MOUTH EVERY DAY 90 tablet 1   atorvastatin (LIPITOR) 40 MG tablet TAKE 1 AND 1/2 TABLET BY MOUTH EVERY NIGHT AT BEDTIME 135 tablet 1   carvedilol (COREG) 6.25 MG tablet TAKE ONE TABLET BY MOUTH TWICE A DAY WITH MEALS 180 tablet 3   Cholecalciferol (VITAMIN D3) 5000 UNITS CAPS Take 5,000 Units by mouth daily.      clobetasol cream (TEMOVATE) 0.05 % as needed.     COVID-19 mRNA bivalent vaccine, Pfizer, (PFIZER COVID-19 VAC BIVALENT) injection Inject into the muscle. 0.3 mL 0   cyclobenzaprine (FLEXERIL) 10 MG tablet Take 1 tablet (10 mg  total) by mouth 2 (two) times daily as needed for muscle spasms. 60 tablet 1   dapagliflozin propanediol (FARXIGA) 10 MG TABS tablet TAKE ONE TABLET BY MOUTH DAILY BEFORE BREAKFAST please schedule an appointment for further refills 30 tablet 4   DULoxetine (CYMBALTA) 60 MG capsule TAKE 1 CAPSULE BY MOUTH EVERY DAY 90 capsule 1   ferrous fumarate (HEMOCYTE - 106 MG FE) 325 (106 Fe) MG TABS tablet Take 1 tablet (106 mg of iron total) by mouth 2 (two) times daily. Do not take at the same time as omeprazole (Prilosec) 60 tablet 6   ketoconazole (NIZORAL) 2 % cream Apply 1 application topically daily. 60 g 0   levothyroxine (SYNTHROID) 112 MCG tablet TAKE ONE TABLET BY MOUTH DAILY BEFORE BREAKFAST 90 tablet 1   metFORMIN (GLUCOPHAGE) 500 MG tablet TAKE 1 TABLET BY MOUTH TWICE DAILY WITH MEALS 180 tablet 1   omeprazole (PRILOSEC) 20 MG capsule TAKE 1 CAPSULE BY MOUTH TWICE DAILY BEFORE MEALS 180 capsule 1   sacubitril-valsartan (ENTRESTO) 49-51 MG Take 1 tablet by mouth 2 (two) times daily. 60 tablet 5   spironolactone (ALDACTONE) 25 MG tablet Take 0.5 tablets (12.5 mg total) by mouth daily. 90 tablet 3   torsemide (DEMADEX) 20 MG tablet Take 2 tablets (40 mg total) by mouth daily. 180 tablet 3   XARELTO 15 MG TABS tablet TAKE 1 TABLET BY MOUTH EVERY DAY WITH SUPPER 90 tablet 1   Zoster Vaccine Adjuvanted [facility]) injection Inject into the muscle. 1 each 1   No current facility-administered medications for this encounter.   Wt Readings from Last 3 Encounters:  03/02/22 97.1 kg (214 lb)  11/29/21 99.1 kg (218 lb 6.4 oz)  10/26/21 96.2 kg (212 lb)   BP 90/60   Pulse 75   Wt 97.1 kg (214 lb)   SpO2 100%   BMI 34.54 kg/m  Physical Exam: General:  NAD. No resp difficulty HEENT: Normal Neck: Supple. No JVD. Carotids 2+ bilat; no bruits. No lymphadenopathy or thryomegaly appreciated. Cor: PMI nondisplaced. Regular rate & rhythm. No rubs, gallops or murmurs. Lungs: Clear Abdomen: Soft,  nontender, nondistended. No hepatosplenomegaly. No bruits or masses.  Good bowel sounds. Extremities: No cyanosis, clubbing, rash, edema Neuro: Alert & oriented x 3, cranial nerves grossly intact. Moves all 4 extremities w/o difficulty. Affect pleasant.  Assessment/Plan: 1. Chronic systolic CHF:  Nonischemic cardiomyopathy.  Echo in 2018 with EF 45-50%.  Echo in 11/20 with EF 25-30%, diffuse hypokinesis, mildly decreased RV systolic function, moderate MR. LHC/RHC in 11/20 with no significant coronary disease, markedly elevated filling pressures and low cardiac output (CI 1.6).  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 Cardiac MRI 11/20 showed LVEF 34%, RVEF 30%, no LGE => perhaps pointing to tachy-mediated CMP as more likely etiology.  Initially required inotropic support w/ milrinone but able to titrate off.  Now s/p AV nodal ablation with MDT CRT-P.  Echo in 2/21 showed EF up to 40-45%, echo in 3/22 with EF up to 55-60%.  NYHA class II symptoms, not volume overloaded by exam or Optivol.   - Continue torsemide 40 mg daily. Recent labs stable, SCr 1.78, K 4.6 - Continue Entresto 49/51 bid.  BP a little soft today, but no symptoms. Looks like BP is generally well-controlled at her previous visits. No med changes for now.  - Continue dapagliflozin 10 mg daily.  - Continue Coreg 6.25 mg bid.   - Continue spironolactone 12.5 mg daily.  2. Atrial fibrillation: Persistent atrial fibrillation, it appears, at least since 9/20 when she was admitted with COVID-19. Prior, she had paroxysmal atrial fibrillation and was maintained on sotalol. She is now off sotalol.  Unable to cardiovert or rate control atrial fibrillation. Therefore, she had AV nodal ablation with BiV pacing.  - Continue Xarelto 15 mg daily. Hgb stable on CBC (3/23). 3. Mitral regurgitation: Functional MR, moderate on TEE.  Rate was  controlled on cardiac MRI, MR was less impressive.  Minimal MR on subsequent echoes.  4. CKD stage 3b: She is on [place]. Recent creatinine has improved. - She is followed by [facility]. 5. Suspect OSA: In-hospital sleep study previously ordered, she needs to complete this, will arrange.  Follow up in 4 months with Dr. Aundra Dubin.   Melinda Mckinney Lodge FNP-BC 03/02/2022

## 2022-03-02 ENCOUNTER — Encounter (HOSPITAL_COMMUNITY): Payer: Self-pay

## 2022-03-02 ENCOUNTER — Other Ambulatory Visit (HOSPITAL_COMMUNITY): Payer: Self-pay | Admitting: Cardiology

## 2022-03-02 ENCOUNTER — Ambulatory Visit (HOSPITAL_COMMUNITY)
Admission: RE | Admit: 2022-03-02 | Discharge: 2022-03-02 | Disposition: A | Discharge status: Home / Self Care | Payer: Medicare Other | Source: Ambulatory Visit | Attending: Family Medicine | Admitting: Family Medicine

## 2022-03-02 VITALS — BP 90/60 | HR 75 | Wt 214.0 lb

## 2022-03-02 DIAGNOSIS — R0683 Snoring: Secondary | ICD-10-CM | POA: Diagnosis not present

## 2022-03-02 DIAGNOSIS — I428 Other cardiomyopathies: Secondary | ICD-10-CM | POA: Diagnosis not present

## 2022-03-02 DIAGNOSIS — Z7901 Long term (current) use of anticoagulants: Secondary | ICD-10-CM | POA: Insufficient documentation

## 2022-03-02 DIAGNOSIS — R0602 Shortness of breath: Secondary | ICD-10-CM | POA: Diagnosis not present

## 2022-03-02 DIAGNOSIS — Z79899 Other long term (current) drug therapy: Secondary | ICD-10-CM | POA: Diagnosis not present

## 2022-03-02 DIAGNOSIS — I4819 Other persistent atrial fibrillation: Secondary | ICD-10-CM | POA: Insufficient documentation

## 2022-03-02 DIAGNOSIS — I4821 Permanent atrial fibrillation: Secondary | ICD-10-CM | POA: Diagnosis not present

## 2022-03-02 DIAGNOSIS — I130 Hypertensive heart and chronic kidney disease with heart failure and stage 1 through stage 4 chronic kidney disease, or unspecified chronic kidney disease: Secondary | ICD-10-CM | POA: Insufficient documentation

## 2022-03-02 DIAGNOSIS — N1832 Chronic kidney disease, stage 3b: Secondary | ICD-10-CM

## 2022-03-02 DIAGNOSIS — I5022 Chronic systolic (congestive) heart failure: Secondary | ICD-10-CM | POA: Diagnosis not present

## 2022-03-02 DIAGNOSIS — E039 Hypothyroidism, unspecified: Secondary | ICD-10-CM | POA: Diagnosis not present

## 2022-03-02 DIAGNOSIS — I2510 Atherosclerotic heart disease of native coronary artery without angina pectoris: Secondary | ICD-10-CM | POA: Diagnosis not present

## 2022-03-02 DIAGNOSIS — I340 Nonrheumatic mitral (valve) insufficiency: Secondary | ICD-10-CM | POA: Diagnosis not present

## 2022-03-02 NOTE — Addendum Note (Signed)
Encounter addended by: Payton Mccallum, RN on: 03/02/2022 10:39 AM  Actions taken: Clinical Note Signed, Flowsheet accepted

## 2022-03-02 NOTE — Telephone Encounter (Signed)
Xarelto '15mg'$  refill request received. Pt is 66 years old, weight-99.1kg, Crea-1.87 on 02/09/2022, last seen by Dr. Aundra Dubin on 08/12/2021, Diagnosis-Afib, CrCl-45.47m/min; Dose is appropriate based on dosing criteria. Will send in refill to requested pharmacy.

## 2022-03-02 NOTE — Patient Instructions (Addendum)
Thank you for coming in today  No labs today  No EGG  Your physician recommends that you schedule a follow-up appointment in:  4 months with Dr. Aundra Dubin call end of August 2023 to make appointment  You have been scheduled for a sleep study 04/11/2022 at 8pm. They will send you detailed information in the mail    Do the following things EVERYDAY: Weigh yourself in the morning before breakfast. Write it down and keep it in a log. Take your medicines as prescribed Eat low salt foods--Limit salt (sodium) to 2000 mg per day.  Stay as active as you can everyday Limit all fluids for the day to less than 2 liters   At the [facility], you and your health needs are our priority. As part of our continuing mission to provide you with exceptional heart care, we have created designated Provider Care Teams. These Care Teams include your primary Cardiologist (physician) and Advanced Practice Providers (APPs- Physician Assistants and Nurse Practitioners) who all work together to provide you with the care you need, when you need it.   You may see any of the following providers on your designated Care Team at your next follow up: Dr Glori Bickers Dr Haynes Kerns, NP Lyda Jester, [place] [facility] [place], [place] Audry Riles, [name]   Please be sure to bring in all your medications bottles to every appointment.   If you have any questions or concerns before your next appointment please send [place] a message through [place] or call our office at [phone].    TO LEAVE A MESSAGE FOR THE NURSE SELECT OPTION 2, PLEASE LEAVE A MESSAGE INCLUDING: YOUR NAME DATE OF BIRTH CALL BACK NUMBER REASON FOR CALL**this is important as we prioritize the call backs  YOU WILL RECEIVE A CALL BACK THE SAME DAY AS LONG AS YOU CALL BEFORE 4:00 PM

## 2022-03-02 NOTE — Addendum Note (Signed)
Encounter addended by: Payton Mccallum, RN on: 03/02/2022 10:43 AM  Actions taken: Clinical Note Signed

## 2022-03-03 DIAGNOSIS — H2513 Age-related nuclear cataract, bilateral: Secondary | ICD-10-CM | POA: Diagnosis not present

## 2022-03-03 DIAGNOSIS — H52221 Regular astigmatism, right eye: Secondary | ICD-10-CM | POA: Diagnosis not present

## 2022-03-03 DIAGNOSIS — H35362 Drusen (degenerative) of macula, left eye: Secondary | ICD-10-CM | POA: Diagnosis not present

## 2022-03-03 DIAGNOSIS — E119 Type 2 diabetes mellitus without complications: Secondary | ICD-10-CM | POA: Diagnosis not present

## 2022-03-03 DIAGNOSIS — H524 Presbyopia: Secondary | ICD-10-CM | POA: Diagnosis not present

## 2022-03-03 DIAGNOSIS — D23111 Other benign neoplasm of skin of right upper eyelid, including canthus: Secondary | ICD-10-CM | POA: Diagnosis not present

## 2022-03-03 DIAGNOSIS — H5202 Hypermetropia, left eye: Secondary | ICD-10-CM | POA: Diagnosis not present

## 2022-03-03 LAB — HM DIABETES EYE EXAM

## 2022-03-14 ENCOUNTER — Ambulatory Visit (INDEPENDENT_AMBULATORY_CARE_PROVIDER_SITE_OTHER): Payer: Medicare Other

## 2022-03-14 DIAGNOSIS — I5022 Chronic systolic (congestive) heart failure: Secondary | ICD-10-CM | POA: Diagnosis not present

## 2022-03-14 DIAGNOSIS — Z950 Presence of cardiac pacemaker: Secondary | ICD-10-CM

## 2022-03-16 NOTE — Progress Notes (Signed)
EPIC Encounter for ICM Monitoring  Patient Name: Melinda Mckinney is a 66 y.o. female Date: 03/16/2022 Primary Care Physican: Colon Branch, MD Primary Cardiologist: Aundra Dubin Electrophysiologist: Santina Evans Pacing: 99.2%         08/26/2021 Office Weight: 210 lbs   11/22/2021 Weight: 212 lbs 12/27/2021 Weight: 212 lbs 01/31/2022 Weight: 216 lbs 02/08/2022 Weight: 214 lbs   Time in AT/AF  24.0 hr/day (100.0%)             Transmission reviewed.    Optivol thoracic impedance suggesting normal fluid levels since 7/3.   Prescribed:  Torsemide 20 mg take 2 tablets (40 mg total) daily.  Spironolactone 25 mg take 1 tablet daily Farxiga 10 mg take 1 tablet daily   Labs: 02/09/2022 Creatinine 1.87, UN 49, Potassium 4.6, Sodium 139, GFR 29 11/04/2021 Creatinine 1.5,   BUN 27, Potassium 5.2, Sodium 142 10/26/2021 Creatinine 1.62, BUN 31, Potassium 4.7, Sodium 140  A complete set of results can be found in Results Review.   Recommendations:  No changes.   Follow-up plan: ICM clinic phone appointment on 04/20/2022.   91 day device clinic remote transmission 04/19/2022.     EP/Cardiology Office Visits:  Recall 06/30/2022 with Dr Aundra Dubin.     Recall 03/31/2022 with Tommye Standard, PA.       Copy of ICM check sent to Dr. Lovena Le.   3 month ICM trend: 03/14/2022.    12-14 Month ICM trend:     Rosalene Billings, RN 03/16/2022 4:31 PM

## 2022-03-29 ENCOUNTER — Encounter (HOSPITAL_BASED_OUTPATIENT_CLINIC_OR_DEPARTMENT_OTHER): Payer: Medicare Other | Admitting: Cardiovascular Disease

## 2022-03-31 ENCOUNTER — Encounter: Payer: Self-pay | Admitting: Internal Medicine

## 2022-04-11 ENCOUNTER — Ambulatory Visit (HOSPITAL_BASED_OUTPATIENT_CLINIC_OR_DEPARTMENT_OTHER): Payer: Medicare Other | Attending: Cardiology | Admitting: Cardiovascular Disease

## 2022-04-11 DIAGNOSIS — R0683 Snoring: Secondary | ICD-10-CM

## 2022-04-11 DIAGNOSIS — G4736 Sleep related hypoventilation in conditions classified elsewhere: Secondary | ICD-10-CM | POA: Diagnosis not present

## 2022-04-11 DIAGNOSIS — I428 Other cardiomyopathies: Secondary | ICD-10-CM

## 2022-04-11 DIAGNOSIS — R0902 Hypoxemia: Secondary | ICD-10-CM | POA: Insufficient documentation

## 2022-04-11 DIAGNOSIS — G4730 Sleep apnea, unspecified: Secondary | ICD-10-CM | POA: Diagnosis not present

## 2022-04-11 DIAGNOSIS — G478 Other sleep disorders: Secondary | ICD-10-CM | POA: Diagnosis not present

## 2022-04-12 ENCOUNTER — Encounter (HOSPITAL_BASED_OUTPATIENT_CLINIC_OR_DEPARTMENT_OTHER): Payer: Self-pay | Admitting: Cardiovascular Disease

## 2022-04-12 NOTE — Procedures (Signed)
Patient Name: Melinda Mckinney, Melinda Mckinney Date: 04/11/2022 Gender: Female D.O.B: 27-Jan-1956 Age (years): 66 Referring Provider: [name] Height (inches): 65 Interpreting Physician: Shelva Majestic MD, ABSM Weight (lbs): 217 RPSGT: Gwenyth Allegra BMI: 35 MRN: [id number] Neck Size: 14.50  CLINICAL INFORMATION Sleep Study Type: NPS  Indication for sleep study: Excessive Daytime Sleepiness, Hypertension  Epworth Sleepiness Score: 9  SLEEP STUDY TECHNIQUE As per the AASM Manual for the Scoring of Sleep and Associated Events v2.3 (April 2016) with a hypopnea requiring 4% desaturations.  The channels recorded and monitored were frontal, central and occipital EEG, electrooculogram (EOG), submentalis EMG (chin), nasal and oral airflow, thoracic and abdominal wall motion, anterior tibialis EMG, snore microphone, electrocardiogram, and pulse oximetry.  MEDICATIONS alendronate (FOSAMAX) 70 MG tablet allopurinol (ZYLOPRIM) 100 MG tablet atorvastatin (LIPITOR) 40 MG tablet carvedilol (COREG) 6.25 MG tablet Cholecalciferol (VITAMIN D3) 5000 UNITS CAPS clobetasol cream (TEMOVATE) 0.05 % COVID-19 mRNA bivalent vaccine, Pfizer, (PFIZER COVID-19 VAC BIVALENT) injection cyclobenzaprine (FLEXERIL) 10 MG tablet dapagliflozin propanediol (FARXIGA) 10 MG TABS tablet DULoxetine (CYMBALTA) 60 MG capsule ferrous fumarate (HEMOCYTE - 106 MG FE) 325 (106 Fe) MG TABS tablet ketoconazole (NIZORAL) 2 % cream levothyroxine (SYNTHROID) 112 MCG tablet metFORMIN (GLUCOPHAGE) 500 MG tablet omeprazole (PRILOSEC) 20 MG capsule sacubitril-valsartan (ENTRESTO) 49-51 MG spironolactone (ALDACTONE) 25 MG tablet torsemide (DEMADEX) 20 MG tablet XARELTO 15 MG TABS tablet Zoster Vaccine Adjuvanted [facility]) injection  Medications self-administered by patient taken the night of the study : N/A  SLEEP ARCHITECTURE The study was initiated at 10:30:52 PM and ended at 4:41:18 AM.  Sleep onset time was 37.4  minutes and the sleep efficiency was 74.1%%. The total sleep time was 274.6 minutes.  Stage REM latency was 126.0 minutes.  The patient spent 15.8%% of the night in stage N1 sleep, 74.0%% in stage N2 sleep, 0.0%% in stage N3 and 10.2% in REM.  Alpha intrusion was absent.  Supine sleep was 10.04%.  RESPIRATORY PARAMETERS The overall apnea/hypopnea index (AHI) was 3.1 per hour. The respiratory disturbance index (RDI) was 7.6/h. There were 0 total apneas, including 0 obstructive, 0 central and 0 mixed apneas. There were 14 hypopneas and 21 RERAs.  The AHI during Stage REM sleep was 30.0 per hour.  AHI while supine was 0.0 per hour.  The mean oxygen saturation was 94.7%. The minimum SpO2 during sleep was 81.0%. Time spent < 88% was 3.4 minutes.  Moderate snoring was noted during this study.  CARDIAC DATA The 2 lead EKG demonstrated pacemaker generated. The mean heart rate was 60.4 beats per minute. Other EKG findings include: None.  LEG MOVEMENT DATA The total PLMS were 0 with a resulting PLMS index of 0.0. Associated arousal with leg movement index was 9.2 .  IMPRESSIONS - Increased upper airway resistance (UARS) without definitive sleep apnea overall (AHI 3.1/h; RDI 7.6/h); however, sleep apnea was severe with REM sleep (AHI 30.0/h).  - Moderate oxygen desaturation to a nadir of 81%. - The patient snored with moderate snoring volume. - No cardiac abnormalities were noted during this study. - Clinically significant periodic limb movements did not occur during sleep. Associated arousals were significant.  DIAGNOSIS - Increased upper airway resistance syndrome (UARS) - Sleep Apnea, unspecified (G 47.30) - Nocturnal Hypoxemia (G47.36)  RECOMMENDATIONS - At present patient does not meet criteria for CPAP.  - Effort should be made to optimize nasal and oropharyngeal patency. - Consider alternatives for the treatment of snoring. - An oral appliance may be considered. - If  symptoms  persist consider a future re-evaluation. - If patient is symptomatic with painful restless legs, consider a trial of pharmacotherapy.  - Avoid alcohol, sedatives and other CNS depressants that may worsen sleep apnea and disrupt normal sleep architecture. - Sleep hygiene should be reviewed to assess factors that may improve sleep quality. - Weight management (BMI 35) and regular exercise should be initiated or continued if appropriate.  [Electronically signed] 04/12/2022 05:40 PM  Shelva Majestic MD, [facility], [place], American Board of Sleep Medicine  NPI: [id number]  [place] PH: [phone]   FX: [phone] [place]

## 2022-04-13 ENCOUNTER — Telehealth: Payer: Self-pay | Admitting: *Deleted

## 2022-04-13 NOTE — Telephone Encounter (Signed)
Husband notified of sleep study results and recommendations. He voiced understanding of what he was told when I asked if he understood. He did not have any questions and thanked me for calling.

## 2022-04-13 NOTE — Telephone Encounter (Signed)
-----   Message from Troy Sine, MD sent at 04/12/2022  5:45 PM EDT ----- Mariann Laster, please notify pt of the results.

## 2022-04-19 ENCOUNTER — Ambulatory Visit (INDEPENDENT_AMBULATORY_CARE_PROVIDER_SITE_OTHER): Payer: Medicare Other

## 2022-04-19 DIAGNOSIS — I5022 Chronic systolic (congestive) heart failure: Secondary | ICD-10-CM

## 2022-04-20 ENCOUNTER — Telehealth: Payer: Self-pay

## 2022-04-20 ENCOUNTER — Ambulatory Visit (INDEPENDENT_AMBULATORY_CARE_PROVIDER_SITE_OTHER): Payer: Medicare Other

## 2022-04-20 DIAGNOSIS — I5022 Chronic systolic (congestive) heart failure: Secondary | ICD-10-CM

## 2022-04-20 DIAGNOSIS — Z950 Presence of cardiac pacemaker: Secondary | ICD-10-CM | POA: Diagnosis not present

## 2022-04-20 LAB — CUP PACEART REMOTE DEVICE CHECK
Battery Remaining Longevity: 88 mo
Battery Voltage: 2.97 V
Brady Statistic RA Percent Paced: 0 %
Brady Statistic RV Percent Paced: 99.92 %
Date Time Interrogation Session: 20230906041626
Implantable Lead Implant Date: 20201109
Implantable Lead Implant Date: 20201109
Implantable Lead Implant Date: 20201109
Implantable Lead Location: 753858
Implantable Lead Location: 753859
Implantable Lead Location: 753860
Implantable Lead Model: 4598
Implantable Lead Model: 5076
Implantable Lead Model: 5076
Implantable Pulse Generator Implant Date: 20201109
Lead Channel Impedance Value: 1007 Ohm
Lead Channel Impedance Value: 1026 Ohm
Lead Channel Impedance Value: 1102 Ohm
Lead Channel Impedance Value: 1330 Ohm
Lead Channel Impedance Value: 1368 Ohm
Lead Channel Impedance Value: 399 Ohm
Lead Channel Impedance Value: 456 Ohm
Lead Channel Impedance Value: 494 Ohm
Lead Channel Impedance Value: 494 Ohm
Lead Channel Impedance Value: 551 Ohm
Lead Channel Impedance Value: 703 Ohm
Lead Channel Impedance Value: 722 Ohm
Lead Channel Impedance Value: 779 Ohm
Lead Channel Impedance Value: 874 Ohm
Lead Channel Pacing Threshold Amplitude: 0.375 V
Lead Channel Pacing Threshold Amplitude: 1.25 V
Lead Channel Pacing Threshold Pulse Width: 0.4 ms
Lead Channel Pacing Threshold Pulse Width: 0.8 ms
Lead Channel Sensing Intrinsic Amplitude: 0.625 mV
Lead Channel Sensing Intrinsic Amplitude: 0.625 mV
Lead Channel Sensing Intrinsic Amplitude: 16.5 mV
Lead Channel Sensing Intrinsic Amplitude: 16.5 mV
Lead Channel Setting Pacing Amplitude: 1.75 V
Lead Channel Setting Pacing Amplitude: 2.5 V
Lead Channel Setting Pacing Pulse Width: 0.4 ms
Lead Channel Setting Pacing Pulse Width: 0.8 ms
Lead Channel Setting Sensing Sensitivity: 2 mV

## 2022-04-20 NOTE — Progress Notes (Signed)
EPIC Encounter for ICM Monitoring  Patient Name: Melinda Mckinney is a 66 y.o. female Date: 04/20/2022 Primary Care Physican: Colon Branch, MD Primary Cardiologist: Aundra Dubin Electrophysiologist: Santina Evans Pacing: 99.9%         08/26/2021 Office Weight: 210 lbs   11/22/2021 Weight: 212 lbs 12/27/2021 Weight: 212 lbs 01/31/2022 Weight: 216 lbs 02/08/2022 Weight: 214 lbs   Time in AT/AF  24.0 hr/day (100.0%)             Attempted call to husband and unable to reach.  Left detailed message per DPR regarding transmission. Transmission reviewed.    Optivol thoracic impedance suggesting possible fluid accumulation starting 8/5.  Fluid index > normal threshold starting 8/16.   Prescribed:  Torsemide 20 mg take 2 tablets (40 mg total) daily.  Spironolactone 25 mg take 1 tablet daily Farxiga 10 mg take 1 tablet daily   Labs: 02/09/2022 Creatinine 1.87, UN 49, Potassium 4.6, Sodium 139, GFR 29 11/04/2021 Creatinine 1.5,   BUN 27, Potassium 5.2, Sodium 142 10/26/2021 Creatinine 1.62, BUN 31, Potassium 4.7, Sodium 140  A complete set of results can be found in Results Review.   Recommendations:  Unable to reach.   Copy sent to Dr Aundra Dubin for review and recommendations if needed.   Follow-up plan: ICM clinic phone appointment on 04/25/2022 to recheck fluid levels.   91 day device clinic remote transmission 07/19/2022.     EP/Cardiology Office Visits:  Recall 06/30/2022 with Dr Aundra Dubin.     Recall 03/31/2022 with Tommye Standard, PA.       Copy of ICM check sent to Dr. Lovena Le.   3 month ICM trend: 04/20/2022.    12-14 Month ICM trend:     Rosalene Billings, RN 04/20/2022 9:30 AM

## 2022-04-20 NOTE — Progress Notes (Signed)
Spoke with husband and advised Dr Aundra Dubin recommended to take Torsemide 3 tablets = 60 mg x 5 days and after 5th day, return to take 2 tablets daily.  Will recheck fluid levels on 9/11.  He repeated instructions back correctly and verbalized understanding.

## 2022-04-20 NOTE — Telephone Encounter (Signed)
Remote ICM transmission received.  Attempted call to husband regarding ICM remote transmission and left detailed message per DPR to return call.

## 2022-04-20 NOTE — Progress Notes (Signed)
Spoke with husband and reports they were on vacation for 17 days ranging from 8/8 - 8/24.   Current weight is 214 lbs which is a 4 lb weight gain for her.  Breathing is normal no reports of edema.     Confirmed she is taking Torsemide as prescribed.    Advised Dr Aundra Dubin will review and provide recommendations if needed.  Encouraged to limit salt and fluid intake.

## 2022-04-20 NOTE — Progress Notes (Signed)
Take torsemide 60 mg daily x 5 days then back to 40 mg daily.  Recheck Optivol reading after that.

## 2022-04-25 ENCOUNTER — Ambulatory Visit (INDEPENDENT_AMBULATORY_CARE_PROVIDER_SITE_OTHER): Payer: Medicare Other

## 2022-04-25 DIAGNOSIS — I5022 Chronic systolic (congestive) heart failure: Secondary | ICD-10-CM

## 2022-04-25 DIAGNOSIS — Z950 Presence of cardiac pacemaker: Secondary | ICD-10-CM

## 2022-04-25 NOTE — Progress Notes (Unsigned)
EPIC Encounter for ICM Monitoring  Patient Name: Melinda Mckinney is a 66 y.o. female Date: 04/25/2022 Primary Care Physican: Colon Branch, MD Primary Cardiologist: Aundra Dubin Electrophysiologist: Santina Evans Pacing: 99.9%         08/26/2021 Office Weight: 210 lbs   11/22/2021 Weight: 212 lbs 12/27/2021 Weight: 212 lbs 01/31/2022 Weight: 216 lbs 02/08/2022 Weight: 214 lbs   Time in AT/AF  24.0 hr/day (100.0%)             Attempted call to husband per DPR and unable to reach.  Left detailed message per DPR regarding transmission. Transmission reviewed.     Optivol thoracic impedance suggesting fluid levels returned to normal after taking Torsemide 60 mg x 5 days per Dr Claris Gladden recommendation.  Fluid index > normal threshold starting 8/16.   Prescribed:  Torsemide 20 mg take 2 tablets (40 mg total) daily.  Spironolactone 25 mg take 1 tablet daily Farxiga 10 mg take 1 tablet daily   Labs: 02/09/2022 Creatinine 1.87, UN 49, Potassium 4.6, Sodium 139, GFR 29 11/04/2021 Creatinine 1.5,   BUN 27, Potassium 5.2, Sodium 142 10/26/2021 Creatinine 1.62, BUN 31, Potassium 4.7, Sodium 140  A complete set of results can be found in Results Review.   Recommendations:  Left voice mail with ICM number and encouraged to call if experiencing any fluid symptoms.   Follow-up plan: ICM clinic phone appointment on 05/30/2022.   91 day device clinic remote transmission 07/19/2022.     EP/Cardiology Office Visits:  Recall 06/30/2022 with Dr Aundra Dubin.     Recall 03/31/2022 with Tommye Standard, PA.       Copy of ICM check sent to Dr. Lovena Le.   3 month ICM trend: 04/25/2022.    12-14 Month ICM trend:     Rosalene Billings, RN 04/25/2022 9:00 AM

## 2022-04-26 ENCOUNTER — Telehealth: Payer: Self-pay

## 2022-04-26 NOTE — Telephone Encounter (Signed)
Remote ICM transmission received.  Attempted call to husband, per DPR, regarding ICM remote transmission and left detailed message per DPR.  Advised to return call for any fluid symptoms or questions. Next ICM remote transmission scheduled 05/30/2022.

## 2022-04-27 ENCOUNTER — Other Ambulatory Visit: Payer: Self-pay

## 2022-04-27 ENCOUNTER — Encounter: Payer: Self-pay | Admitting: Internal Medicine

## 2022-04-27 NOTE — Progress Notes (Signed)
[facility])                                            [place] Team                                        Statin Quality Measure Assessment    04/27/2022  Melinda Mckinney July 17, 1956 [id number]  Per review of chart and payor information, this patient has been flagged for non-adherence to the following CMS Quality Measure:   '[x]'$  Statin Use in Persons with Diabetes  '[x]'$  Statin Use in Persons with Cardiovascular Disease  The ASCVD Risk score (Arnett DK, et al., 2019) failed to calculate for the following reasons:   The patient has a prior MI or stroke diagnosis  Atorvastatin 40 mg on file but has not been filled in 2023. Prescription for atorvastatin expires August 2023. Last LDL 89 mg/dL. Next appt on 04/28/22 with PCP. If deemed clinically appropriate, please consider discussion of statin compliance and assess prescription renewal.    Please consider ONE of the following recommendations:   Initiate high intensity statin Atorvastatin '40mg'$  once daily, #90, 3 refills   Rosuvastatin '20mg'$  once daily, #90, 3 refills    Initiate moderate intensity          statin with reduced frequency if prior          statin intolerance 1x weekly, #13, 3 refills   2x weekly, #26, 3 refills   3x weekly, #39, 3 refills   Code for past statin intolerance or other exclusions (required annually)  Drug Induced Myopathy G72.0   Myositis, unspecified M60.9   Rhabdomyolysis M62.82   Cirrhosis of liver K74.69   Biliary cirrhosis, unspecified K74.5   Abnormal blood glucose - for SUPD ONLY R73.09   Prediabetes - for SUPD ONLY  R73.03   Thank you for your time,  Kristeen Miss, [place] Cell: [phone]

## 2022-04-28 ENCOUNTER — Ambulatory Visit (INDEPENDENT_AMBULATORY_CARE_PROVIDER_SITE_OTHER): Payer: Medicare Other | Admitting: Internal Medicine

## 2022-04-28 ENCOUNTER — Encounter: Payer: Self-pay | Admitting: Internal Medicine

## 2022-04-28 VITALS — BP 118/82 | HR 89 | Temp 98.2°F | Resp 16 | Ht 65.0 in | Wt 218.0 lb

## 2022-04-28 DIAGNOSIS — E785 Hyperlipidemia, unspecified: Secondary | ICD-10-CM | POA: Diagnosis not present

## 2022-04-28 DIAGNOSIS — Z23 Encounter for immunization: Secondary | ICD-10-CM

## 2022-04-28 DIAGNOSIS — M109 Gout, unspecified: Secondary | ICD-10-CM

## 2022-04-28 DIAGNOSIS — E039 Hypothyroidism, unspecified: Secondary | ICD-10-CM

## 2022-04-28 DIAGNOSIS — E1159 Type 2 diabetes mellitus with other circulatory complications: Secondary | ICD-10-CM

## 2022-04-28 DIAGNOSIS — I10 Essential (primary) hypertension: Secondary | ICD-10-CM | POA: Diagnosis not present

## 2022-04-28 LAB — BASIC METABOLIC PANEL
BUN: 77 mg/dL — ABNORMAL HIGH (ref 6–23)
CO2: 27 mEq/L (ref 19–32)
Calcium: 9.6 mg/dL (ref 8.4–10.5)
Chloride: 97 mEq/L (ref 96–112)
Creatinine, Ser: 2.14 mg/dL — ABNORMAL HIGH (ref 0.40–1.20)
GFR: 23.23 mL/min — ABNORMAL LOW (ref >60.00)
Glucose, Bld: 120 mg/dL — ABNORMAL HIGH (ref 70–99)
Potassium: 4.8 mEq/L (ref 3.5–5.1)
Sodium: 138 mEq/L (ref 135–145)

## 2022-04-28 LAB — CBC WITH DIFFERENTIAL/PLATELET
Basophils Absolute: 0.1 10*3/uL (ref 0.0–0.1)
Basophils Relative: 1.5 % (ref 0.0–3.0)
Eosinophils Absolute: 0.1 10*3/uL (ref 0.0–0.7)
Eosinophils Relative: 1.9 % (ref 0.0–5.0)
HCT: 35.8 % — ABNORMAL LOW (ref 36.0–46.0)
Hemoglobin: 11.7 g/dL — ABNORMAL LOW (ref 12.0–15.0)
Lymphocytes Relative: 24.2 % (ref 12.0–46.0)
Lymphs Abs: 1.5 10*3/uL (ref 0.7–4.0)
MCHC: 32.6 g/dL (ref 30.0–36.0)
MCV: 94.5 fl (ref 78.0–100.0)
Monocytes Absolute: 0.4 10*3/uL (ref 0.1–1.0)
Monocytes Relative: 6.4 % (ref 3.0–12.0)
Neutro Abs: 4.1 10*3/uL (ref 1.4–7.7)
Neutrophils Relative %: 66 % (ref 43.0–77.0)
Platelets: 295 10*3/uL (ref 150.0–400.0)
RBC: 3.78 Mil/uL — ABNORMAL LOW (ref 3.87–5.11)
RDW: 15.4 % (ref 11.5–15.5)
WBC: 6.2 10*3/uL (ref 4.0–10.5)

## 2022-04-28 LAB — MICROALBUMIN / CREATININE URINE RATIO
Creatinine,U: 83.4 mg/dL
Microalb Creat Ratio: 0.8 mg/g (ref 0.0–30.0)
Microalb, Ur: <0.7 mg/dL (ref 0.0–1.9)

## 2022-04-28 LAB — LIPID PANEL
Cholesterol: 242 mg/dL — ABNORMAL HIGH (ref 0–200)
HDL: 65 mg/dL (ref >39.00)
LDL Cholesterol: 143 mg/dL — ABNORMAL HIGH (ref 0–99)
NonHDL: 177.37
Total CHOL/HDL Ratio: 4
Triglycerides: 174 mg/dL — ABNORMAL HIGH (ref 0.0–149.0)
VLDL: 34.8 mg/dL (ref 0.0–40.0)

## 2022-04-28 LAB — URIC ACID: Uric Acid, Serum: 7.3 mg/dL — ABNORMAL HIGH (ref 2.4–7.0)

## 2022-04-28 LAB — TSH: TSH: 0.71 u[IU]/mL (ref 0.35–5.50)

## 2022-04-28 LAB — HEMOGLOBIN A1C: Hgb A1c MFr Bld: 6.4 % (ref 4.6–6.5)

## 2022-04-28 MED ORDER — CLOBETASOL PROPIONATE 0.05 % EX CREA: TOPICAL_CREAM | Freq: Two times a day (BID) | CUTANEOUS | 1 refills | Status: AC | PRN

## 2022-04-28 NOTE — Patient Instructions (Addendum)
Recommend to proceed with the following vaccines at your pharmacy:  Shingrix (shingles) #2 Covid booster (bivalent)   I removed Fosamax from your list since you are not taking it.  Recommend to schedule a visit with Dr. Cruzita Lederer for the treatment of your osteoporosis.  Check the  blood pressure regularly BP GOAL is between 110/65 and  135/85. If it is consistently higher or lower, let me know     GO TO THE LAB : Get the blood work     Lib[place], [place] back for   a checkup in 3 months

## 2022-04-28 NOTE — Assessment & Plan Note (Signed)
DM: On metformin, check A1c.  Consider adjust metformin dose depending on creatinine. HTN: BP today is great, multiple meds.  Check CBC High cholesterol on atorvastatin.  Check FLP Hypothyroidism:  on Synthroid check TSH OSA: per Sleep study 03-2022: Increase upper airway resistance, + OSA, no meet criteria for CPAP.  Multiple interventions/strategies rec by cardiology. Atrial fibrillation, systolic CHF, nonischemic cardiomyopathy:  Saw cardiology 03/02/2022, no changes made. CKD, saw nephrology 10-2021, stage IIIb. Osteoporosis, vitamin D deficiency: Has not refilled  Fosamax since 2021, will remove from medication list, advised of the importance to be treated, recommend to see endo. Allopurinol: When she established with me she was taking allopurinol, ? history of gout.  Check uric acid. Vulvar pruritus, refill clobetasol as requested by patient Flu shot today RTC 3 months

## 2022-04-28 NOTE — Progress Notes (Signed)
Subjective:    Patient ID: Melinda Mckinney, female    DOB: 1955/10/28, 66 y.o.   MRN: [id number]  DOS:  04/28/2022 Type of visit - description: Follow-up, here with her husband  Patient has multiple medical problems and sees multiple providers. Polypharmacy noted. In general feeling well. Last week had some lower extremity edema, reports that she took some extra water pills and now she is better.   Review of Systems See above   Past Medical History:  Diagnosis Date   Anemia    Anxiety and depression    Asthma    Borderline diabetes    CAD (coronary artery disease) ~2009   stent    Cervical cancer ([place])    "stage 1; had cryotherapy"   CHF (congestive heart failure) (HCC)    Chronic lower back pain    Complete heart block ([place]) 06/2019   s/p AV nodal ablation and CRT-P by Dr Lovena Le   DDD (degenerative disc disease), lumbar    Dr. Nelva Bush, recommends lumbar epidural steroid injections L5-S1 to the right   Depression    Fibromyalgia    GERD (gastroesophageal reflux disease)    High cholesterol    History of hiatal hernia    History of kidney stones    Hypertension    Hypothyroidism    Osteoporosis    Permanent atrial fibrillation ([place]) 07/2017   s/p AV nodal ablation   Thyroid disease     Past Surgical History:  Procedure Laterality Date   AV NODE ABLATION N/A 06/24/2019   Procedure: AV NODE ABLATION;  Surgeon: Evans Lance, MD;  Location: [place] [facility];  Service: Cardiovascular;  Laterality: N/A;   BIV PACEMAKER INSERTION CRT-P N/A 06/24/2019   Procedure: BIV PACEMAKER INSERTION CRT-P;  Surgeon: Evans Lance, MD;  Location: [place] [facility];  Service: Cardiovascular;  Laterality: N/A;   CARDIAC CATHETERIZATION  ~ 2008   CARDIOVERSION N/A 08/05/2017   Procedure: CARDIOVERSION;  Surgeon: Evans Lance, MD;  Location: [facility] OR;  Service: Cardiovascular;  Laterality: N/A;   CARDIOVERSION N/A 08/24/2017   Procedure: CARDIOVERSION;  Surgeon: Sanda Klein, MD;   Location: [facility];  Service: Cardiovascular;  Laterality: N/A;   CARDIOVERSION N/A 06/19/2019   Procedure: CARDIOVERSION;  Surgeon: Larey Dresser, MD;  Location: [facility];  Service: Cardiovascular;  Laterality: N/A;   CARDIOVERSION N/A 06/21/2019   Procedure: CARDIOVERSION;  Surgeon: Larey Dresser, MD;  Location: [facility] ENDOSCOPY;  Service: Cardiovascular;  Laterality: N/A;   [place]; ~ 1978/1979; 1984   ESOPHAGOMYOTOMY  2009   GYNECOLOGIC CRYOSURGERY  X 2   "stage 1 cancer"   HERNIA REPAIR  X 6   "all in my stomach" (07/27/2017)   KNEE ARTHROSCOPY Right    NEPHRECTOMY Right 1991   in [place], due to fibrosis and lithiasis   RIGHT/LEFT HEART CATH AND CORONARY ANGIOGRAPHY N/A 06/17/2019   Procedure: RIGHT/LEFT HEART CATH AND CORONARY ANGIOGRAPHY;  Surgeon: Lorretta Harp, MD;  Location: [facility];  Service: Cardiovascular;  Laterality: N/A;   TEE WITHOUT CARDIOVERSION N/A 06/19/2019   Procedure: TRANSESOPHAGEAL ECHOCARDIOGRAM (TEE);  Surgeon: Larey Dresser, MD;  Location: [facility] OR;  Service: Cardiovascular;  Laterality: N/A;   TUBAL LIGATION      Current Outpatient Medications  Medication Instructions   allopurinol (ZYLOPRIM) 100 mg, Oral, Daily   atorvastatin (LIPITOR) 40 MG tablet TAKE 1 AND 1/2 TABLET BY MOUTH EVERY NIGHT AT BEDTIME   carvedilol (COREG) 6.25  MG tablet TAKE ONE TABLET BY MOUTH TWICE A DAY WITH MEALS   clobetasol cream (TEMOVATE) 0.05 % Topical, 2 times daily PRN   cyclobenzaprine (FLEXERIL) 10 mg, Oral, 2 times daily PRN   dapagliflozin propanediol (FARXIGA) 10 MG TABS tablet TAKE ONE TABLET BY MOUTH DAILY BEFORE BREAKFAST please schedule an appointment for further refills   DULoxetine (CYMBALTA) 60 MG capsule TAKE 1 CAPSULE BY MOUTH EVERY DAY   ferrous fumarate (HEMOCYTE - 106 MG FE) 325 (106 Fe) MG TABS tablet 106 mg of iron, Oral, 2 times daily, Do not take at the same time as omeprazole (Prilosec)   ketoconazole (NIZORAL) 2 % cream 1  application , Topical, Daily   levothyroxine (SYNTHROID) 112 MCG tablet TAKE ONE TABLET BY MOUTH DAILY BEFORE BREAKFAST   metFORMIN (GLUCOPHAGE) 500 MG tablet TAKE 1 TABLET BY MOUTH TWICE DAILY WITH MEALS   omeprazole (PRILOSEC) 20 MG capsule TAKE 1 CAPSULE BY MOUTH TWICE DAILY BEFORE MEALS   sacubitril-valsartan (ENTRESTO) 49-51 MG 1 tablet, Oral, 2 times daily   spironolactone (ALDACTONE) 12.5 mg, Oral, Daily   torsemide (DEMADEX) 40 mg, Oral, Daily   Vitamin D3 5,000 Units, Oral, Daily   XARELTO 15 MG TABS tablet TAKE 1 TABLET BY MOUTH EVERY DAY WITH SUPPER       Objective:   Physical Exam BP 118/82   Pulse 89   Temp 98.2 F (36.8 C) (Oral)   Resp 16   Ht '5\' 5"'$  (1.651 m)   Wt 218 lb (98.9 kg)   SpO2 93%   BMI 36.28 kg/m  General:   Well developed, NAD, BMI noted. HEENT:  Normocephalic . Face symmetric, atraumatic Lungs:  CTA B Normal respiratory effort, no intercostal retractions, no accessory muscle use. Heart: Seems regular Lower extremities: no pretibial edema bilaterally  Skin: Not pale. Not jaundice Neurologic:  alert & oriented X3.  Speech normal, gait appropriate for age and unassisted Psych--  Cognition and judgment appear intact.  Cooperative with normal attention span and concentration.  Behavior appropriate. No anxious or depressed appearing.      Assessment     Assessment   DM (A1c 6.7 03-2020).  Per PCP HTN High cholesterol Hypothyroidism.  Per PCP Anxiety depression, insomnia CV: --CAD: stent ~ 2009 --Atrial fibrillation DX 06-2017  --06-2019 A. fib, RVR, failed cardioversion,, cath: Normal coronaries, had AV nodal ablation, ICD implanted  --Non ischemic cardiomyopathy, DX 06-2019, tachycardia related? Vitamin D deficiency, saw Dr. Cruzita Lederer 07-2016, rtc 1 year, celiac dz test (-), unlikely  Malabsortion; cont supplements Osteoporosis: Per Endo as of 10-2020 GI: ---GERD ---H/o achalasia, s/p Heller 2009 CKD: -S/p R  Nephrectomy ([place]  1991 due to stones/fibrosis) -Sees renal  MSK: --Fibromyalgia --DJD  Knee pain R --Back pain -- s/p epidural 10-16  GYN: h/o cervical dysplasia Derm: vulvar pruritus, temovate prn ok per gyn OSA: Sleep study 03-2022: Increase upper airway resistance, + OSA, no meet criteria for CPAP.  Multiple interventions recommended by cardiology. Obesity    PLAN: DM: On metformin, check A1c.  Consider adjust metformin dose depending on creatinine. HTN: BP today is great, multiple meds.  Check CBC High cholesterol on atorvastatin.  Check FLP Hypothyroidism:  on Synthroid check TSH OSA: per Sleep study 03-2022: Increase upper airway resistance, + OSA, no meet criteria for CPAP.  Multiple interventions/strategies rec by cardiology. Atrial fibrillation, systolic CHF, nonischemic cardiomyopathy:  Saw cardiology 03/02/2022, no changes made. CKD, saw nephrology 10-2021, stage IIIb. Osteoporosis, vitamin D deficiency: Has not refilled  Fosamax since 2021, will remove from medication list, advised of the importance to be treated, recommend to see endo. Allopurinol: When she established with me she was taking allopurinol, ? history of gout.  Check uric acid. Vulvar pruritus, refill clobetasol as requested by patient Flu shot today RTC 3 months

## 2022-04-29 ENCOUNTER — Other Ambulatory Visit: Payer: Self-pay | Admitting: Internal Medicine

## 2022-05-01 ENCOUNTER — Other Ambulatory Visit (HOSPITAL_COMMUNITY): Payer: Self-pay | Admitting: Cardiology

## 2022-05-01 DIAGNOSIS — I5022 Chronic systolic (congestive) heart failure: Secondary | ICD-10-CM

## 2022-05-05 ENCOUNTER — Other Ambulatory Visit: Payer: Self-pay

## 2022-05-05 ENCOUNTER — Telehealth: Payer: Self-pay | Admitting: Internal Medicine

## 2022-05-05 DIAGNOSIS — M810 Age-related osteoporosis without current pathological fracture: Secondary | ICD-10-CM

## 2022-05-05 MED ORDER — ALENDRONATE SODIUM 70 MG PO TABS: ORAL_TABLET | ORAL | 1 refills | Status: DC

## 2022-05-05 NOTE — Telephone Encounter (Signed)
Ok to send alendronate 70 mg once a week x 15 tabs with 1 refill

## 2022-05-05 NOTE — Telephone Encounter (Signed)
RX now sent to preferred pharmacy.  

## 2022-05-05 NOTE — Telephone Encounter (Signed)
MEDICATION: Fosamax  PHARMACY:  [facility] [address] CONTACTED THEIR PHARMACY?  no  IS THIS A 90 DAY SUPPLY : YES  IS PATIENT OUT OF MEDICATION: yes  IF NOT; HOW MUCH IS LEFT:   LAST APPOINTMENT DATE: '@01'$ /06/2021  NEXT APPOINTMENT DATE:'@3'$ /01/2023  DO WE HAVE YOUR PERMISSION TO LEAVE A DETAILED MESSAGE?:  yes, [phone]  OTHER COMMENTS: medication is not on meds list but is noted in the last office AVS   **Let patient know to contact pharmacy at the end of the day to make sure medication is ready. **  ** Please notify patient to allow 48-72 hours to process**  **Encourage patient to contact the pharmacy for refills or they can request refills through [facility]**

## 2022-05-09 DIAGNOSIS — N1832 Chronic kidney disease, stage 3b: Secondary | ICD-10-CM | POA: Diagnosis not present

## 2022-05-09 LAB — BASIC METABOLIC PANEL
BUN: 59 — AB (ref 4–21)
CO2: 23 — AB (ref 13–22)
Chloride: 101 (ref 99–108)
Creatinine: 2 — AB (ref 0.5–1.1)
Glucose: 104
Potassium: 4.4 mEq/L (ref 3.5–5.1)
Sodium: 139 (ref 137–147)

## 2022-05-09 LAB — COMPREHENSIVE METABOLIC PANEL
Albumin: 4 (ref 3.5–5.0)
Calcium: 8.9 (ref 8.7–10.7)
eGFR: 27

## 2022-05-09 LAB — CBC AND DIFFERENTIAL: Hemoglobin: 10.4 — AB (ref 12.0–16.0)

## 2022-05-09 NOTE — Progress Notes (Signed)
Remote pacemaker transmission.   

## 2022-05-10 ENCOUNTER — Other Ambulatory Visit: Payer: Self-pay | Admitting: Internal Medicine

## 2022-05-12 DIAGNOSIS — N1832 Chronic kidney disease, stage 3b: Secondary | ICD-10-CM | POA: Diagnosis not present

## 2022-05-12 DIAGNOSIS — I129 Hypertensive chronic kidney disease with stage 1 through stage 4 chronic kidney disease, or unspecified chronic kidney disease: Secondary | ICD-10-CM | POA: Diagnosis not present

## 2022-05-12 DIAGNOSIS — N179 Acute kidney failure, unspecified: Secondary | ICD-10-CM | POA: Diagnosis not present

## 2022-05-12 DIAGNOSIS — N2581 Secondary hyperparathyroidism of renal origin: Secondary | ICD-10-CM | POA: Diagnosis not present

## 2022-05-12 DIAGNOSIS — D631 Anemia in chronic kidney disease: Secondary | ICD-10-CM | POA: Diagnosis not present

## 2022-05-12 DIAGNOSIS — Z905 Acquired absence of kidney: Secondary | ICD-10-CM | POA: Diagnosis not present

## 2022-05-18 ENCOUNTER — Other Ambulatory Visit (HOSPITAL_COMMUNITY): Payer: Self-pay | Admitting: *Deleted

## 2022-05-18 ENCOUNTER — Telehealth: Payer: Self-pay

## 2022-05-18 MED ORDER — TORSEMIDE 20 MG PO TABS: 40.0000 mg | ORAL_TABLET | Freq: Every day | ORAL | 3 refills | Status: DC

## 2022-05-18 NOTE — Telephone Encounter (Signed)
Received call from husband per DPR.  He reports patient is out of Torsemide medication and needs 90 day refill sent to [address] preferred pharmacy. Pt is currently out of this medication.    Pt and husband will be out of the country from 11/7 through 09/16/2022.  Advised to discuss with pharmacy regarding how to get either extra medication to take with her since she will be gone for a long period of time.    Will also need to reschedule the 3 month device clinic remote transmission from 12/5 until after 09/16/22 when she returns.      Advised husband will send Torsemide refill request to HF clinic.  Will send request to reschedule remote transmission to device clinic.

## 2022-05-19 ENCOUNTER — Encounter: Payer: Self-pay | Admitting: Internal Medicine

## 2022-05-26 DIAGNOSIS — N1832 Chronic kidney disease, stage 3b: Secondary | ICD-10-CM | POA: Diagnosis not present

## 2022-06-07 ENCOUNTER — Telehealth: Payer: Self-pay | Admitting: Pharmacist

## 2022-06-07 MED ORDER — ATORVASTATIN CALCIUM 40 MG PO TABS: 60.0000 mg | ORAL_TABLET | Freq: Every day | ORAL | 3 refills | Status: DC

## 2022-06-07 NOTE — Telephone Encounter (Signed)
Patient has type 2 DM and has not filled statin recently. Looks like she needs updated prescription at her pharmacy for atorvastatin '40mg'$  - take 1.5 tablet daily.  Tried to contact patient with use of Language Line interpreter - unable to reach patient but interpreter left a message that updated Rx was sent to her pharmacy. Patient can call [phone] if she has questions or concerns.

## 2022-06-07 NOTE — Progress Notes (Signed)
No ICM remote transmission received for 05/30/2022 and next ICM transmission scheduled for 10/03/2022.  OV for defib check on 10/30.  Pt will be out of the country 11/7 - 2/12.

## 2022-06-08 ENCOUNTER — Other Ambulatory Visit (HOSPITAL_BASED_OUTPATIENT_CLINIC_OR_DEPARTMENT_OTHER): Payer: Self-pay

## 2022-06-08 MED ORDER — SHINGRIX 50 MCG/0.5ML IM SUSR
INTRAMUSCULAR | 0 refills | Status: DC
  Filled 2022-06-08: qty 1, 1d supply, fill #0

## 2022-06-12 NOTE — Progress Notes (Unsigned)
Cardiology Office Note Date:  06/13/2022  Patient ID:  Melinda Mckinney, Melinda Mckinney 07/09/1956, MRN [id number] PCP:  Colon Branch, MD  Cardiologist:  Dr. Oval Linsey (?) AHF: Dr. Aundra Dubin Electrophysiologist: Dr. Rayann Heman     Chief Complaint: annual visit  History of Present Illness: Melinda Mckinney is a 66 y.o. female with history of CAD (PCI while in [place]), AFib, HTN, HLD, DM, fibromyalgia, NICM (?techy-mediated), chronic CHF (combined), hypothyroidism, single kidney (s/p nephrectomy), CKD (IIIb)  She saw Dr. Rayann Heman Aug 2022, denied any symptoms, no changes were made.  She follows closely with HF team, last July 2023, volume status was good, planned for sleep testing.  Sleep study was done, did not need CPAP  Followed by L. Short as well.   TODAY Today's visit is done with translator Melinda Mckinney. She is accompanied by her husband, the pt also seems to understand English quite well. She is feeling quite well Just back from [place], planning a trip to [place] next week.  No CP, palpitations, denies any rest SOB, des have some DOE with long periods of walking though this is her baseline, unchanged. No near syncope or syncope. No bleeding or signs of bleeding  She reports home weight generally 210-215 waxes/wanes, has been up over the last 2 weeks gradually to 220 this AM at home as well. She has afternoon edema that resolves overnight   Device information MDT CRT-P, implanted 04/24/2019 AVnode ablation  AFib/AAD hx Diagnosed 2018 Sotalol Dec 2018 Amiodarone 2020 in environment of diverticulitis >>failed DCCV's despite amio and meds remained in RVR >>> device implant  and AV node ablation  Past Medical History:  Diagnosis Date   Anemia    Anxiety and depression    Asthma    Borderline diabetes    CAD (coronary artery disease) ~2009   stent    Cervical cancer ([place])    "stage 1; had cryotherapy"   CHF (congestive heart failure) ([place])    Chronic lower back pain    Complete heart  block (Melinda Mckinney) 06/2019   s/p AV nodal ablation and CRT-P by Dr Lovena Le   DDD (degenerative disc disease), lumbar    Dr. Nelva Bush, recommends lumbar epidural steroid injections L5-S1 to the right   Depression    Fibromyalgia    GERD (gastroesophageal reflux disease)    High cholesterol    History of hiatal hernia    History of kidney stones    Hypertension    Hypothyroidism    Osteoporosis    Permanent atrial fibrillation ([place]) 07/2017   s/p AV nodal ablation   Thyroid disease     Past Surgical History:  Procedure Laterality Date   AV NODE ABLATION N/A 06/24/2019   Procedure: AV NODE ABLATION;  Surgeon: Evans Lance, MD;  Location: [place] [facility];  Service: Cardiovascular;  Laterality: N/A;   BIV PACEMAKER INSERTION CRT-P N/A 06/24/2019   Procedure: BIV PACEMAKER INSERTION CRT-P;  Surgeon: Evans Lance, MD;  Location: [place] [facility];  Service: Cardiovascular;  Laterality: N/A;   CARDIAC CATHETERIZATION  ~ 2008   CARDIOVERSION N/A 08/05/2017   Procedure: CARDIOVERSION;  Surgeon: Evans Lance, MD;  Location: [place];  Service: Cardiovascular;  Laterality: N/A;   CARDIOVERSION N/A 08/24/2017   Procedure: CARDIOVERSION;  Surgeon: Sanda Klein, MD;  Location: [place] [facility];  Service: Cardiovascular;  Laterality: N/A;   CARDIOVERSION N/A 06/19/2019   Procedure: CARDIOVERSION;  Surgeon: Larey Dresser, MD;  Location: [place];  Service: Cardiovascular;  Laterality: N/A;   CARDIOVERSION N/A 06/21/2019   Procedure: CARDIOVERSION;  Surgeon: Larey Dresser, MD;  Location: [facility];  Service: Cardiovascular;  Laterality: N/A;   [place]; ~ 1978/1979; 1984   ESOPHAGOMYOTOMY  2009   GYNECOLOGIC CRYOSURGERY  X 2   "stage 1 cancer"   HERNIA REPAIR  X 6   "all in my stomach" (07/27/2017)   KNEE ARTHROSCOPY Right    NEPHRECTOMY Right 1991   in [place], due to fibrosis and lithiasis   RIGHT/LEFT HEART CATH AND CORONARY ANGIOGRAPHY N/A 06/17/2019   Procedure:  RIGHT/LEFT HEART CATH AND CORONARY ANGIOGRAPHY;  Surgeon: Lorretta Harp, MD;  Location: [facility];  Service: Cardiovascular;  Laterality: N/A;   TEE WITHOUT CARDIOVERSION N/A 06/19/2019   Procedure: TRANSESOPHAGEAL ECHOCARDIOGRAM (TEE);  Surgeon: Larey Dresser, MD;  Location: [facility] OR;  Service: Cardiovascular;  Laterality: N/A;   TUBAL LIGATION      Current Outpatient Medications  Medication Sig Dispense Refill   alendronate (FOSAMAX) 70 MG tablet Take 1 tablet weekly with a full glass of water on an empty stomach 15 tablet 1   allopurinol (ZYLOPRIM) 100 MG tablet TAKE 1 TABLET BY MOUTH EVERY DAY 90 tablet 1   atorvastatin (LIPITOR) 40 MG tablet Take 1.5 tablets (60 mg total) by mouth daily. 135 tablet 3   carvedilol (COREG) 6.25 MG tablet TAKE ONE TABLET BY MOUTH TWICE A DAY WITH MEALS 180 tablet 3   Cholecalciferol (VITAMIN D3) 5000 UNITS CAPS Take 5,000 Units by mouth daily.      clobetasol cream (TEMOVATE) 0.05 % Apply topically 2 (two) times daily as needed. 60 g 1   cyclobenzaprine (FLEXERIL) 10 MG tablet Take 1 tablet (10 mg total) by mouth 2 (two) times daily as needed for muscle spasms. 60 tablet 1   DULoxetine (CYMBALTA) 60 MG capsule TAKE 1 CAPSULE BY MOUTH EVERY DAY 90 capsule 1   FARXIGA 10 MG TABS tablet TAKE 1 TABLET BY MOUTH DAILY BEFORE BREAKFAST 30 tablet 11   ferrous fumarate (HEMOCYTE - 106 MG FE) 325 (106 Fe) MG TABS tablet Take 1 tablet (106 mg of iron total) by mouth 2 (two) times daily. Do not take at the same time as omeprazole (Prilosec) 60 tablet 6   ketoconazole (NIZORAL) 2 % cream Apply 1 application topically daily. 60 g 0   levothyroxine (SYNTHROID) 112 MCG tablet Take 1 tablet (112 mcg total) by mouth daily before breakfast. 90 tablet 1   omeprazole (PRILOSEC) 20 MG capsule TAKE 1 CAPSULE BY MOUTH TWICE DAILY BEFORE MEALS 180 capsule 1   sacubitril-valsartan (ENTRESTO) 49-51 MG Take 1 tablet by mouth 2 (two) times daily. 60 tablet 5   spironolactone  (ALDACTONE) 25 MG tablet Take 0.5 tablets (12.5 mg total) by mouth daily. 90 tablet 3   torsemide (DEMADEX) 20 MG tablet Take 2 tablets (40 mg total) by mouth daily. 180 tablet 3   triamcinolone (KENALOG) 0.025 % ointment Apply topically as needed.     XARELTO 15 MG TABS tablet TAKE 1 TABLET BY MOUTH EVERY DAY WITH SUPPER 90 tablet 1   Zoster Vaccine Adjuvanted [facility]) injection Inject into the muscle. 1 each 0   metFORMIN (GLUCOPHAGE) 500 MG tablet Take 500 mg by mouth in the morning and at bedtime. (Patient not taking: Reported on 06/13/2022)     No current facility-administered medications for this visit.    Allergies:   Penicillins   Social History:  The patient  reports  that she quit smoking about 23 years ago. Her smoking use included cigarettes. She has a 56.00 pack-year smoking history. She has never used smokeless tobacco. She reports current alcohol use of about 5.0 standard drinks of alcohol per week. She reports that she does not use drugs.   Family History:  The patient's family history includes Breast cancer in an other family member; CAD in her brother and father; Diabetes in her father; Lung cancer in her father and mother; Stroke in her sister.  ROS:  Please see the history of present illness.    All other systems are reviewed and otherwise negative.   PHYSICAL EXAM:  VS:  BP 110/64   Pulse 71   Ht '5\' 5"'$  (1.651 m)   Wt 220 lb (99.8 kg)   SpO2 97%   BMI 36.61 kg/m  BMI: Body mass index is 36.61 kg/m. Well nourished, well developed, in no acute distress HEENT: normocephalic, atraumatic Neck: no JVD, carotid bruits or masses Cardiac:  RRR; no significant murmurs, no rubs, or gallops Lungs:  CTA b/l, no wheezing, rhonchi or rales Abd: soft, nontender MS: no deformity or atrophy Ext: no edema Skin: warm and dry, no rash Neuro:  No gross deficits appreciated Psych: euthymic mood, full affect  PPM site is stable, no tethering or discomfort   EKG:  Done today  and reviewed by myself shows  AFib V paced 71bpm  Device interrogation done today and reviewed by myself:  Battery and lead measurements are good No VT 100% AF OptiVol is on an upward trend   11/12/2020: TTE  1. Difficult acoustic windows. Some of apical images are foreshortened.  Compared to echo from February 2021, LVEF is improved.. Left ventricular  ejection fraction, by estimation, is 55 to 60%. The left ventricle has  normal function. The left ventricle  has no regional wall motion abnormalities. The left ventricular internal  cavity size was mildly dilated. Left ventricular diastolic parameters are  indeterminate.   2. Right ventricular systolic function is normal. The right ventricular  size is normal. There is normal pulmonary artery systolic pressure.   3. Left atrial size was severely dilated.   4. Mild mitral valve regurgitation.   5. The aortic valve is tricuspid. Aortic valve regurgitation is mild.  Mild aortic valve sclerosis is present, with no evidence of aortic valve  stenosis.   6. The inferior vena cava is normal in size with greater than 50%  respiratory variability, suggesting right atrial pressure of 3 mmHg.     Echo (2018): EF 45-50%.  - Echo (11/20): EF 25-30%, mildly decreased RV systolic function, moderate MR.  - TEE (11/20): EF 15%, moderately decreased RV systolic function, moderate MR.  - Cardiac MRI (11/20): EF 34%, RV EF 30%, no LGE (done after AV nodal ablation and BiV pacing).  - RHC (11/20): mean RA 22, mean PCWP 43, LVEDP 45, CI 1.6 (Fick) and 2.7 (thermodilution).  - Echo (2/21): EF 40-45%, mild diffuse hypokinesis, low normal RV systolic function.  - Echo (3/22): EF 55-60%, RV normal, severe LAE  - LHC (11/20): No significant coronary disease.     Echo 07/27/17 Study Conclusions - Left ventricle: The cavity size was normal. Wall thickness was   normal. Systolic function was mildly reduced. The estimated   ejection fraction was in the  range of 45% to 50%. - Aortic valve: There was mild regurgitation. - Mitral valve: There was moderate regurgitation. - Right atrium: The atrium was mildly dilated.  Myoview 07/28/17  There was no ST segment deviation noted during stress. Nuclear stress EF: 28%. The left ventricular ejection fraction is severely decreased (<30%). Defect 1: There is a small defect of mild severity present in the mid anterior, mid anteroseptal and apical septal location. This is a fixed defect and is likely due to breast or chest wall attenuation. This is a high risk study based on reduction of LV function . There is no evidence of ischemia or infarction .   Recent Labs: 08/12/2021: B Natriuretic Peptide 147.8 04/28/2022: Platelets 295.0; TSH 0.71 05/09/2022: BUN 59; Creatinine 2.0; Hemoglobin 10.4; Potassium 4.4; Sodium 139  04/28/2022: Cholesterol 242; HDL 65.00; LDL Cholesterol 143; Total CHOL/HDL Ratio 4; Triglycerides 174.0; VLDL 34.8   CrCl cannot be calculated (Patient's most recent lab result is older than the maximum 21 days allowed.).   Wt Readings from Last 3 Encounters:  06/13/22 220 lb (99.8 kg)  04/28/22 218 lb (98.9 kg)  04/11/22 217 lb (98.4 kg)     Other studies reviewed: Additional studies/records reviewed today include: summarized above  ASSESSMENT AND PLAN:  1. Permanent AFib     CHA2DS2Vasc is 5, on Xarelto,  appropriately dosed at 15     s/p AV node ablation        2. CAD     No anginal complaints     on BB, statin, no ASA with a/c     C/w Dr. Aundra Dubin   3. HTN    looks good   4. NICM, LVEF recovered by her last echo     On BB, entresto, spironolactone, torsemide     Exam appears euvolemic     OptiVol heading up but exam does not support volume OL     She has been traveling and admits to some unusual sodium, weight gain has been over thelast 2 weeks, slower rise.      Advised to reign in her sodium, keep an eye on weight/edema.  Discussed transition to new EP MD,   plan to see Dr. Myles Gip she is agreeable   Disposition: F/u with remotes as usual, Dr. Myles Gip in a year, sooner if needed  Current medicines are reviewed at length with the patient today.  The patient did not have any concerns regarding medicines.  Venetia Night, PA-C 06/13/2022 8:29 AM     [facility] [address] [place] [address] [phone] (office)  [phone] (fax)

## 2022-06-13 ENCOUNTER — Encounter: Payer: Self-pay | Admitting: Physician Assistant

## 2022-06-13 ENCOUNTER — Ambulatory Visit: Payer: Medicare Other | Attending: Physician Assistant | Admitting: Physician Assistant

## 2022-06-13 VITALS — BP 110/64 | HR 71 | Ht 65.0 in | Wt 220.0 lb

## 2022-06-13 DIAGNOSIS — Z950 Presence of cardiac pacemaker: Secondary | ICD-10-CM

## 2022-06-13 DIAGNOSIS — I428 Other cardiomyopathies: Secondary | ICD-10-CM

## 2022-06-13 DIAGNOSIS — I4821 Permanent atrial fibrillation: Secondary | ICD-10-CM | POA: Diagnosis not present

## 2022-06-13 DIAGNOSIS — I10 Essential (primary) hypertension: Secondary | ICD-10-CM

## 2022-06-13 DIAGNOSIS — I5022 Chronic systolic (congestive) heart failure: Secondary | ICD-10-CM | POA: Diagnosis not present

## 2022-06-13 DIAGNOSIS — I2510 Atherosclerotic heart disease of native coronary artery without angina pectoris: Secondary | ICD-10-CM

## 2022-06-13 LAB — CUP PACEART INCLINIC DEVICE CHECK
Battery Remaining Longevity: 80 mo
Battery Voltage: 2.97 V
Brady Statistic RA Percent Paced: 0 %
Brady Statistic RV Percent Paced: 99.46 %
Date Time Interrogation Session: 20231030124515
Implantable Lead Connection Status: 753985
Implantable Lead Connection Status: 753985
Implantable Lead Connection Status: 753985
Implantable Lead Implant Date: 20201109
Implantable Lead Implant Date: 20201109
Implantable Lead Implant Date: 20201109
Implantable Lead Location: 753858
Implantable Lead Location: 753859
Implantable Lead Location: 753860
Implantable Lead Model: 4598
Implantable Lead Model: 5076
Implantable Lead Model: 5076
Implantable Pulse Generator Implant Date: 20201109
Lead Channel Impedance Value: 1045 Ohm
Lead Channel Impedance Value: 1197 Ohm
Lead Channel Impedance Value: 1273 Ohm
Lead Channel Impedance Value: 380 Ohm
Lead Channel Impedance Value: 418 Ohm
Lead Channel Impedance Value: 456 Ohm
Lead Channel Impedance Value: 456 Ohm
Lead Channel Impedance Value: 513 Ohm
Lead Channel Impedance Value: 627 Ohm
Lead Channel Impedance Value: 684 Ohm
Lead Channel Impedance Value: 741 Ohm
Lead Channel Impedance Value: 798 Ohm
Lead Channel Impedance Value: 931 Ohm
Lead Channel Impedance Value: 969 Ohm
Lead Channel Pacing Threshold Amplitude: 0.5 V
Lead Channel Pacing Threshold Amplitude: 1.5 V
Lead Channel Pacing Threshold Pulse Width: 0.4 ms
Lead Channel Pacing Threshold Pulse Width: 0.8 ms
Lead Channel Sensing Intrinsic Amplitude: 0.75 mV
Lead Channel Sensing Intrinsic Amplitude: 1.375 mV
Lead Channel Sensing Intrinsic Amplitude: 10.875 mV
Lead Channel Sensing Intrinsic Amplitude: 10.875 mV
Lead Channel Setting Pacing Amplitude: 2 V
Lead Channel Setting Pacing Amplitude: 2.5 V
Lead Channel Setting Pacing Pulse Width: 0.4 ms
Lead Channel Setting Pacing Pulse Width: 0.8 ms
Lead Channel Setting Sensing Sensitivity: 2 mV
Zone Setting Status: 755011
Zone Setting Status: 755011

## 2022-06-13 NOTE — Patient Instructions (Signed)
Medication Instructions:   Your physician recommends that you continue on your current medications as directed. Please refer to the Current Medication list given to you today.  *If you need a refill on your cardiac medications before your next appointment, please call your pharmacy*   Lab Work: [place]   If you have labs (blood work) drawn today and your tests are completely normal, you will receive your results only by: [place] (if you have MyChart) OR A paper copy in the mail If you have any lab test that is abnormal or we need to change your treatment, we will call you to review the results.   Testing/Procedures: NONE ORDERED  TODAY    Follow-Up: At [facility], you and your health needs are our priority.  As part of our continuing mission to provide you with exceptional heart care, we have created designated Provider Care Teams.  These Care Teams include your primary Cardiologist (physician) and Advanced Practice Providers (APPs -  Physician Assistants and Nurse Practitioners) who all work together to provide you with the care you need, when you need it.  We recommend signing up for the patient portal called "MyChart".  Sign up information is provided on this After Visit Summary.  MyChart is used to connect with patients for Virtual Visits (Telemedicine).  Patients are able to view lab/test results, encounter notes, upcoming appointments, etc.  Non-urgent messages can be sent to your provider as well.   To learn more about what you can do with MyChart, go to [email or web address].    Your next appointment:   1 year(s)  The format for your next appointment:   In Person  Provider:   Doralee Albino, MD      Important Information About Sugar

## 2022-06-13 NOTE — Telephone Encounter (Signed)
Verified with pharmacy technician with [facility] that atorvastatin Rx was picked up last week.  Will continue to follow patient in 2024 to ensure adherence to statin therapy.

## 2022-07-01 ENCOUNTER — Other Ambulatory Visit: Payer: Self-pay | Admitting: Internal Medicine

## 2022-07-09 ENCOUNTER — Other Ambulatory Visit: Payer: Self-pay | Admitting: Internal Medicine

## 2022-07-31 ENCOUNTER — Other Ambulatory Visit (HOSPITAL_COMMUNITY): Payer: Self-pay | Admitting: Internal Medicine

## 2022-08-01 ENCOUNTER — Other Ambulatory Visit (HOSPITAL_BASED_OUTPATIENT_CLINIC_OR_DEPARTMENT_OTHER): Payer: Self-pay

## 2022-08-01 MED ORDER — COMIRNATY 30 MCG/0.3ML IM SUSY
PREFILLED_SYRINGE | INTRAMUSCULAR | 0 refills | Status: DC
  Filled 2022-08-01: qty 0.3, 1d supply, fill #0

## 2022-08-17 ENCOUNTER — Other Ambulatory Visit (HOSPITAL_COMMUNITY): Payer: Self-pay | Admitting: Cardiology

## 2022-08-30 ENCOUNTER — Other Ambulatory Visit (HOSPITAL_COMMUNITY): Payer: Self-pay | Admitting: Cardiology

## 2022-08-31 ENCOUNTER — Ambulatory Visit: Payer: Medicare Other | Admitting: Internal Medicine

## 2022-09-16 DIAGNOSIS — N1832 Chronic kidney disease, stage 3b: Secondary | ICD-10-CM | POA: Diagnosis not present

## 2022-09-16 LAB — BASIC METABOLIC PANEL
BUN: 43 — AB (ref 4–21)
CO2: 23 — AB (ref 13–22)
Chloride: 102 (ref 99–108)
Creatinine: 1.7 — AB (ref 0.5–1.1)
Glucose: 101
Potassium: 3.8 mEq/L (ref 3.5–5.1)
Sodium: 140 (ref 137–147)

## 2022-09-16 LAB — PROTEIN / CREATININE RATIO, URINE
Albumin, U: 11.7
Creatinine, Urine: 117.4

## 2022-09-16 LAB — COMPREHENSIVE METABOLIC PANEL
Albumin: 4.3 (ref 3.5–5.0)
Calcium: 9.5 (ref 8.7–10.7)
eGFR: 32

## 2022-09-16 LAB — CBC AND DIFFERENTIAL: Hemoglobin: 11.3 — AB (ref 12.0–16.0)

## 2022-09-22 DIAGNOSIS — I129 Hypertensive chronic kidney disease with stage 1 through stage 4 chronic kidney disease, or unspecified chronic kidney disease: Secondary | ICD-10-CM | POA: Diagnosis not present

## 2022-09-22 DIAGNOSIS — N1832 Chronic kidney disease, stage 3b: Secondary | ICD-10-CM | POA: Diagnosis not present

## 2022-09-22 DIAGNOSIS — N2581 Secondary hyperparathyroidism of renal origin: Secondary | ICD-10-CM | POA: Diagnosis not present

## 2022-09-22 DIAGNOSIS — Z905 Acquired absence of kidney: Secondary | ICD-10-CM | POA: Diagnosis not present

## 2022-09-22 DIAGNOSIS — D631 Anemia in chronic kidney disease: Secondary | ICD-10-CM | POA: Diagnosis not present

## 2022-09-22 DIAGNOSIS — I959 Hypotension, unspecified: Secondary | ICD-10-CM | POA: Diagnosis not present

## 2022-10-03 ENCOUNTER — Ambulatory Visit: Payer: Medicare Other | Admitting: Internal Medicine

## 2022-10-03 ENCOUNTER — Ambulatory Visit: Payer: Medicare Other

## 2022-10-03 DIAGNOSIS — I428 Other cardiomyopathies: Secondary | ICD-10-CM | POA: Diagnosis not present

## 2022-10-03 LAB — CUP PACEART REMOTE DEVICE CHECK
Battery Remaining Longevity: 75 mo
Battery Voltage: 2.96 V
Brady Statistic RA Percent Paced: 0 %
Brady Statistic RV Percent Paced: 99.43 %
Date Time Interrogation Session: 20240219050535
Implantable Lead Connection Status: 753985
Implantable Lead Connection Status: 753985
Implantable Lead Connection Status: 753985
Implantable Lead Implant Date: 20201109
Implantable Lead Implant Date: 20201109
Implantable Lead Implant Date: 20201109
Implantable Lead Location: 753858
Implantable Lead Location: 753859
Implantable Lead Location: 753860
Implantable Lead Model: 4598
Implantable Lead Model: 5076
Implantable Lead Model: 5076
Implantable Pulse Generator Implant Date: 20201109
Lead Channel Impedance Value: 1026 Ohm
Lead Channel Impedance Value: 1159 Ohm
Lead Channel Impedance Value: 1368 Ohm
Lead Channel Impedance Value: 1387 Ohm
Lead Channel Impedance Value: 361 Ohm
Lead Channel Impedance Value: 456 Ohm
Lead Channel Impedance Value: 456 Ohm
Lead Channel Impedance Value: 494 Ohm
Lead Channel Impedance Value: 532 Ohm
Lead Channel Impedance Value: 703 Ohm
Lead Channel Impedance Value: 722 Ohm
Lead Channel Impedance Value: 817 Ohm
Lead Channel Impedance Value: 855 Ohm
Lead Channel Impedance Value: 969 Ohm
Lead Channel Pacing Threshold Amplitude: 0.375 V
Lead Channel Pacing Threshold Amplitude: 1.375 V
Lead Channel Pacing Threshold Pulse Width: 0.4 ms
Lead Channel Pacing Threshold Pulse Width: 0.8 ms
Lead Channel Sensing Intrinsic Amplitude: 1.125 mV
Lead Channel Sensing Intrinsic Amplitude: 1.125 mV
Lead Channel Sensing Intrinsic Amplitude: 5.375 mV
Lead Channel Sensing Intrinsic Amplitude: 5.375 mV
Lead Channel Setting Pacing Amplitude: 2 V
Lead Channel Setting Pacing Amplitude: 2.5 V
Lead Channel Setting Pacing Pulse Width: 0.4 ms
Lead Channel Setting Pacing Pulse Width: 0.8 ms
Lead Channel Setting Sensing Sensitivity: 2 mV
Zone Setting Status: 755011
Zone Setting Status: 755011

## 2022-10-04 ENCOUNTER — Encounter: Payer: Self-pay | Admitting: Internal Medicine

## 2022-10-04 ENCOUNTER — Ambulatory Visit: Payer: Medicare Other

## 2022-10-04 DIAGNOSIS — I5022 Chronic systolic (congestive) heart failure: Secondary | ICD-10-CM

## 2022-10-04 DIAGNOSIS — Z950 Presence of cardiac pacemaker: Secondary | ICD-10-CM

## 2022-10-04 LAB — CUP PACEART REMOTE DEVICE CHECK
Battery Remaining Longevity: 75 mo
Battery Remaining Longevity: 75 mo
Battery Voltage: 2.96 V
Battery Voltage: 2.96 V
Brady Statistic RA Percent Paced: 0 %
Brady Statistic RA Percent Paced: 0 %
Brady Statistic RV Percent Paced: 99.43 %
Brady Statistic RV Percent Paced: 99.43 %
Date Time Interrogation Session: 20240219050535
Date Time Interrogation Session: 20240219050535
Implantable Lead Connection Status: 753985
Implantable Lead Connection Status: 753985
Implantable Lead Connection Status: 753985
Implantable Lead Connection Status: 753985
Implantable Lead Connection Status: 753985
Implantable Lead Connection Status: 753985
Implantable Lead Implant Date: 20201109
Implantable Lead Implant Date: 20201109
Implantable Lead Implant Date: 20201109
Implantable Lead Implant Date: 20201109
Implantable Lead Implant Date: 20201109
Implantable Lead Implant Date: 20201109
Implantable Lead Location: 753858
Implantable Lead Location: 753858
Implantable Lead Location: 753859
Implantable Lead Location: 753859
Implantable Lead Location: 753860
Implantable Lead Location: 753860
Implantable Lead Model: 4598
Implantable Lead Model: 4598
Implantable Lead Model: 5076
Implantable Lead Model: 5076
Implantable Lead Model: 5076
Implantable Lead Model: 5076
Implantable Pulse Generator Implant Date: 20201109
Implantable Pulse Generator Implant Date: 20201109
Lead Channel Impedance Value: 1026 Ohm
Lead Channel Impedance Value: 1026 Ohm
Lead Channel Impedance Value: 1159 Ohm
Lead Channel Impedance Value: 1159 Ohm
Lead Channel Impedance Value: 1368 Ohm
Lead Channel Impedance Value: 1368 Ohm
Lead Channel Impedance Value: 1387 Ohm
Lead Channel Impedance Value: 1387 Ohm
Lead Channel Impedance Value: 361 Ohm
Lead Channel Impedance Value: 361 Ohm
Lead Channel Impedance Value: 456 Ohm
Lead Channel Impedance Value: 456 Ohm
Lead Channel Impedance Value: 456 Ohm
Lead Channel Impedance Value: 456 Ohm
Lead Channel Impedance Value: 494 Ohm
Lead Channel Impedance Value: 494 Ohm
Lead Channel Impedance Value: 532 Ohm
Lead Channel Impedance Value: 532 Ohm
Lead Channel Impedance Value: 703 Ohm
Lead Channel Impedance Value: 703 Ohm
Lead Channel Impedance Value: 722 Ohm
Lead Channel Impedance Value: 722 Ohm
Lead Channel Impedance Value: 817 Ohm
Lead Channel Impedance Value: 817 Ohm
Lead Channel Impedance Value: 855 Ohm
Lead Channel Impedance Value: 855 Ohm
Lead Channel Impedance Value: 969 Ohm
Lead Channel Impedance Value: 969 Ohm
Lead Channel Pacing Threshold Amplitude: 0.375 V
Lead Channel Pacing Threshold Amplitude: 0.375 V
Lead Channel Pacing Threshold Amplitude: 1.375 V
Lead Channel Pacing Threshold Amplitude: 1.375 V
Lead Channel Pacing Threshold Pulse Width: 0.4 ms
Lead Channel Pacing Threshold Pulse Width: 0.4 ms
Lead Channel Pacing Threshold Pulse Width: 0.8 ms
Lead Channel Pacing Threshold Pulse Width: 0.8 ms
Lead Channel Sensing Intrinsic Amplitude: 1.125 mV
Lead Channel Sensing Intrinsic Amplitude: 1.125 mV
Lead Channel Sensing Intrinsic Amplitude: 1.125 mV
Lead Channel Sensing Intrinsic Amplitude: 1.125 mV
Lead Channel Sensing Intrinsic Amplitude: 5.375 mV
Lead Channel Sensing Intrinsic Amplitude: 5.375 mV
Lead Channel Sensing Intrinsic Amplitude: 5.375 mV
Lead Channel Sensing Intrinsic Amplitude: 5.375 mV
Lead Channel Setting Pacing Amplitude: 2 V
Lead Channel Setting Pacing Amplitude: 2 V
Lead Channel Setting Pacing Amplitude: 2.5 V
Lead Channel Setting Pacing Amplitude: 2.5 V
Lead Channel Setting Pacing Pulse Width: 0.4 ms
Lead Channel Setting Pacing Pulse Width: 0.4 ms
Lead Channel Setting Pacing Pulse Width: 0.8 ms
Lead Channel Setting Pacing Pulse Width: 0.8 ms
Lead Channel Setting Sensing Sensitivity: 2 mV
Lead Channel Setting Sensing Sensitivity: 2 mV
Zone Setting Status: 755011
Zone Setting Status: 755011
Zone Setting Status: 755011
Zone Setting Status: 755011

## 2022-10-05 NOTE — Progress Notes (Signed)
EPIC Encounter for ICM Monitoring  Patient Name: Melinda Mckinney is a 67 y.o. female Date: 10/05/2022 Primary Care Physican: Colon Branch, MD Primary Cardiologist: Aundra Dubin Electrophysiologist: Santina Evans Pacing: 99.8%         08/26/2021 Office Weight: 210 lbs   11/22/2021 Weight: 212 lbs 12/27/2021 Weight: 212 lbs 01/31/2022 Weight: 216 lbs 02/08/2022 Weight: 214 lbs 10/05/2022 Weight: 209 lbs   Time in AT/AF  24.0 hr/day (100.0%)             Spoke with patient and heart failure questions reviewed.  Transmission results reviewed.  Pt asymptomatic for fluid accumulation.  Reports feeling well at this time and voices no complaints.   [place] from [place]22 to 09/2022.      Optivol thoracic impedance suggesting normal fluid levels within the last month.  Possible fluid accumulation during the months that was out of the country.   Fluid index > normal threshold starting 8/16.   Prescribed:  Torsemide 20 mg take 2 tablets (40 mg total) daily.  Spironolactone 25 mg take 1 tablet daily Farxiga 10 mg take 1 tablet daily   Labs: 02/09/2022 Creatinine 1.87, UN 49, Potassium 4.6, Sodium 139, GFR 29 11/04/2021 Creatinine 1.5,   BUN 27, Potassium 5.2, Sodium 142 10/26/2021 Creatinine 1.62, BUN 31, Potassium 4.7, Sodium 140  A complete set of results can be found in Results Review.   Recommendations:  No changes and encouraged to call if experiencing any fluid symptoms.   Follow-up plan: ICM clinic phone appointment on 11/07/2022.   91 day device clinic remote transmission 01/02/2023.     EP/Cardiology Office Visits:  Recall 06/30/2022 with Dr Aundra Dubin.     Recall 06/08/2023 with Dr Myles Gip.       Copy of ICM check sent to Dr. Lovena Le.    3 month ICM trend: 10/04/2022.    12-14 Month ICM trend:     Rosalene Billings, RN 10/05/2022 4:51 PM

## 2022-10-06 ENCOUNTER — Other Ambulatory Visit: Payer: Self-pay | Admitting: Internal Medicine

## 2022-10-11 ENCOUNTER — Encounter: Payer: Self-pay | Admitting: Internal Medicine

## 2022-10-11 ENCOUNTER — Ambulatory Visit (INDEPENDENT_AMBULATORY_CARE_PROVIDER_SITE_OTHER): Payer: Medicare Other | Admitting: Internal Medicine

## 2022-10-11 VITALS — BP 120/68 | HR 93 | Temp 98.1°F | Resp 18 | Ht 65.0 in | Wt 217.5 lb

## 2022-10-11 DIAGNOSIS — I10 Essential (primary) hypertension: Secondary | ICD-10-CM

## 2022-10-11 DIAGNOSIS — E785 Hyperlipidemia, unspecified: Secondary | ICD-10-CM

## 2022-10-11 DIAGNOSIS — E1159 Type 2 diabetes mellitus with other circulatory complications: Secondary | ICD-10-CM

## 2022-10-11 DIAGNOSIS — E039 Hypothyroidism, unspecified: Secondary | ICD-10-CM

## 2022-10-11 NOTE — Patient Instructions (Addendum)
Vaccines I recommend: RSV vaccine  Check the  blood pressure regularly BP GOAL is between 110/65 and  135/85. If it is consistently higher or lower, let me know    GO TO THE LAB : Get the blood work     Melinda Mckinney, Diagonal back for   a physical exam in 4 months

## 2022-10-11 NOTE — Progress Notes (Signed)
Subjective:    Patient ID: Melinda Mckinney, female    DOB: 04/30/1956, 67 y.o.   MRN: [id number]  DOS:  10/11/2022 Type of visit - description: rov  Since the last office visit is doing great. Reports good compliance with medication. Saw nephrology and cardiology: Notes reviewed. Has lost some weight, reports a healthy diet.  Wt Readings from Last 3 Encounters:  10/11/22 217 lb 8 oz (98.7 kg)  06/13/22 220 lb (99.8 kg)  04/28/22 218 lb (98.9 kg)     Review of Systems See above   Past Medical History:  Diagnosis Date   Anemia    Anxiety and depression    Asthma    Borderline diabetes    CAD (coronary artery disease) ~2009   stent    Cervical cancer ([place])    "stage 1; had cryotherapy"   CHF (congestive heart failure) ([facility])    Chronic lower back pain    Complete heart block ([place]) 06/2019   s/p AV nodal ablation and CRT-P by Dr Lovena Le   DDD (degenerative disc disease), lumbar    Dr. Nelva Bush, recommends lumbar epidural steroid injections L5-S1 to the right   Depression    Fibromyalgia    GERD (gastroesophageal reflux disease)    High cholesterol    History of hiatal hernia    History of kidney stones    Hypertension    Hypothyroidism    Osteoporosis    Permanent atrial fibrillation ([place]) 07/2017   s/p AV nodal ablation   Thyroid disease     Past Surgical History:  Procedure Laterality Date   AV NODE ABLATION N/A 06/24/2019   Procedure: AV NODE ABLATION;  Surgeon: Evans Lance, MD;  Location: [place] [facility];  Service: Cardiovascular;  Laterality: N/A;   BIV PACEMAKER INSERTION CRT-P N/A 06/24/2019   Procedure: BIV PACEMAKER INSERTION CRT-P;  Surgeon: Evans Lance, MD;  Location: [place] CV LAB;  Service: Cardiovascular;  Laterality: N/A;   CARDIAC CATHETERIZATION  ~ 2008   CARDIOVERSION N/A 08/05/2017   Procedure: CARDIOVERSION;  Surgeon: Evans Lance, MD;  Location: [facility] OR;  Service: Cardiovascular;  Laterality: N/A;   CARDIOVERSION N/A 08/24/2017    Procedure: CARDIOVERSION;  Surgeon: Sanda Klein, MD;  Location: [place] ENDOSCOPY;  Service: Cardiovascular;  Laterality: N/A;   CARDIOVERSION N/A 06/19/2019   Procedure: CARDIOVERSION;  Surgeon: Larey Dresser, MD;  Location: [place];  Service: Cardiovascular;  Laterality: N/A;   CARDIOVERSION N/A 06/21/2019   Procedure: CARDIOVERSION;  Surgeon: Larey Dresser, MD;  Location: [facility] ENDOSCOPY;  Service: Cardiovascular;  Laterality: N/A;   [place]; ~ 1978/1979; 1984   ESOPHAGOMYOTOMY  2009   GYNECOLOGIC CRYOSURGERY  X 2   "stage 1 cancer"   HERNIA REPAIR  X 6   "all in my stomach" (07/27/2017)   KNEE ARTHROSCOPY Right    NEPHRECTOMY Right 1991   in [place], due to fibrosis and lithiasis   RIGHT/LEFT HEART CATH AND CORONARY ANGIOGRAPHY N/A 06/17/2019   Procedure: RIGHT/LEFT HEART CATH AND CORONARY ANGIOGRAPHY;  Surgeon: Lorretta Harp, MD;  Location: [place] CV LAB;  Service: Cardiovascular;  Laterality: N/A;   TEE WITHOUT CARDIOVERSION N/A 06/19/2019   Procedure: TRANSESOPHAGEAL ECHOCARDIOGRAM (TEE);  Surgeon: Larey Dresser, MD;  Location: [facility] OR;  Service: Cardiovascular;  Laterality: N/A;   TUBAL LIGATION      Current Outpatient Medications  Medication Instructions   alendronate (FOSAMAX) 70 MG tablet Take 1 tablet weekly with a full  glass of water on an empty stomach   allopurinol (ZYLOPRIM) 100 mg, Oral, Daily   atorvastatin (LIPITOR) 60 mg, Oral, Daily   carvedilol (COREG) 6.25 MG tablet TAKE ONE TABLET BY MOUTH TWICE A DAY WITH A MEAL   clobetasol cream (TEMOVATE) 0.05 % Topical, 2 times daily PRN   cyclobenzaprine (FLEXERIL) 10 mg, Oral, 2 times daily PRN   DULoxetine (CYMBALTA) 60 MG capsule TAKE 1 CAPSULE BY MOUTH EVERY DAY   Farxiga 10 mg, Oral, Daily before breakfast   ferrous fumarate (HEMOCYTE - 106 MG FE) 325 (106 Fe) MG TABS tablet 106 mg of iron, Oral, 2 times daily, Do not take at the same time as omeprazole (Prilosec)   ketoconazole (NIZORAL) 2 %  cream 1 application , Topical, Daily   levothyroxine (SYNTHROID) 112 mcg, Oral, Daily before breakfast   metFORMIN (GLUCOPHAGE) 500 mg, Oral, 2 times daily with meals   omeprazole (PRILOSEC) 20 mg, Oral, 2 times daily before meals   sacubitril-valsartan (ENTRESTO) 49-51 MG 1 tablet, Oral, 2 times daily   spironolactone (ALDACTONE) 12.5 mg, Oral, Daily   torsemide (DEMADEX) 40 mg, Oral, Daily   triamcinolone (KENALOG) 0.025 % ointment Topical, As needed   Vitamin D3 5,000 Units, Oral, Daily   XARELTO 15 MG TABS tablet TAKE 1 TABLET BY MOUTH EVERY DAY WITH SUPPER       Objective:   Physical Exam BP 120/68   Pulse 93   Temp 98.1 F (36.7 C) (Oral)   Resp 18   Ht '5\' 5"'$  (1.651 m)   Wt 217 lb 8 oz (98.7 kg)   SpO2 95%   BMI 36.19 kg/m  General:   Well developed, NAD, BMI noted. HEENT:  Normocephalic . Face symmetric, atraumatic Lungs:  CTA B Normal respiratory effort, no intercostal retractions, no accessory muscle use. Heart: RRR,  no murmur.  DM foot exam: Good pedal pulses, no edema, pinprick examination normal Skin: Not pale. Not jaundice Neurologic:  alert & oriented X3.  Speech normal, gait appropriate for age and unassisted Psych--  Cognition and judgment appear intact.  Cooperative with normal attention span and concentration.  Behavior appropriate. No anxious or depressed appearing.      Assessment     Assessment   DM (A1c 6.7 03-2020).  Per PCP HTN High cholesterol Hypothyroidism.  Per PCP Anxiety depression, insomnia CV: --CAD: stent ~ 2009 --Atrial fibrillation DX 06-2017  --06-2019 A. fib, RVR, failed cardioversion,, cath: Normal coronaries, had AV nodal ablation, ICD implanted  --Non ischemic cardiomyopathy, DX 06-2019, tachycardia related? Vitamin D deficiency, saw Dr. Cruzita Lederer 07-2016, rtc 1 year, celiac dz test (-), unlikely  Malabsortion; cont supplements Osteoporosis: Per Endo as of 10-2020 GI: ---GERD ---H/o achalasia, s/p [place]  2009 CKD: -S/p R  Nephrectomy ([place] 1991 due to stones/fibrosis) -Sees renal  MSK: --Fibromyalgia --DJD  Knee pain R --Back pain -- s/p epidural 10-16  GYN: h/o cervical dysplasia Derm: vulvar pruritus, temovate prn ok per gyn OSA: Sleep study 03-2022: Increase upper airway resistance, + OSA, no meet criteria for CPAP.  Multiple interventions recommended by cardiology. Obesity   PLAN: DM: Doing well with diet, currently on metformin.  Feet exam negative.  Check A1c HTN: BP today is very good, recommend to monitor at home, continue carvedilol, Entresto, Aldactone, Demadex.  Recent BMP okay. High cholesterol: On atorvastatin 60 mg daily.  Checking labs CAD, permanent A-fib, NICM: Saw cardiology October 2023, on Xarelto, no anginal symptoms, felt to be stable CKD stage IIIb: Saw  nephrology 09/22/2022, she noted to be on [place].  Next visit 6 months Preventive care: Up-to-date on vaccines. RTC 4 months CPX

## 2022-10-12 LAB — LIPID PANEL
Cholesterol: 163 mg/dL (ref 0–200)
HDL: 79.5 mg/dL (ref >39.00)
LDL Cholesterol: 59 mg/dL (ref 0–99)
NonHDL: 83.91
Total CHOL/HDL Ratio: 2
Triglycerides: 127 mg/dL (ref 0.0–149.0)
VLDL: 25.4 mg/dL (ref 0.0–40.0)

## 2022-10-12 LAB — HEMOGLOBIN A1C: Hgb A1c MFr Bld: 6.1 % (ref 4.6–6.5)

## 2022-10-12 LAB — AST: AST: 21 U/L (ref 0–37)

## 2022-10-12 LAB — ALT: ALT: 15 U/L (ref 0–35)

## 2022-10-12 LAB — TSH: TSH: 1.9 u[IU]/mL (ref 0.35–5.50)

## 2022-10-12 NOTE — Assessment & Plan Note (Signed)
DM: Doing well with diet, currently on metformin.  Feet exam negative.  Check A1c HTN: BP today is very good, recommend to monitor at home, continue carvedilol, Entresto, Aldactone, Demadex.  Recent BMP okay. High cholesterol: On atorvastatin 60 mg daily.  Checking labs CAD, permanent A-fib, NICM: Saw cardiology October 2023, on Xarelto, no anginal symptoms, felt to be stable CKD stage IIIb: Saw nephrology 09/22/2022, she noted to be on [place].  Next visit 6 months Preventive care: Up-to-date on vaccines. RTC 4 months CPX

## 2022-10-13 ENCOUNTER — Encounter: Payer: Self-pay | Admitting: Internal Medicine

## 2022-10-13 DIAGNOSIS — Z1231 Encounter for screening mammogram for malignant neoplasm of breast: Secondary | ICD-10-CM | POA: Diagnosis not present

## 2022-10-13 LAB — HM MAMMOGRAPHY

## 2022-10-19 ENCOUNTER — Encounter: Payer: Self-pay | Admitting: Internal Medicine

## 2022-10-19 ENCOUNTER — Ambulatory Visit (INDEPENDENT_AMBULATORY_CARE_PROVIDER_SITE_OTHER): Payer: Medicare Other | Admitting: Internal Medicine

## 2022-10-19 VITALS — BP 120/72 | HR 89 | Ht 65.0 in | Wt 217.4 lb

## 2022-10-19 DIAGNOSIS — M810 Age-related osteoporosis without current pathological fracture: Secondary | ICD-10-CM | POA: Diagnosis not present

## 2022-10-19 DIAGNOSIS — E039 Hypothyroidism, unspecified: Secondary | ICD-10-CM | POA: Diagnosis not present

## 2022-10-19 DIAGNOSIS — E559 Vitamin D deficiency, unspecified: Secondary | ICD-10-CM

## 2022-10-19 LAB — VITAMIN D 25 HYDROXY (VIT D DEFICIENCY, FRACTURES): VITD: 49.21 ng/mL (ref 30.00–100.00)

## 2022-10-19 MED ORDER — ALENDRONATE SODIUM 70 MG PO TABS: ORAL_TABLET | ORAL | 3 refills | Status: DC

## 2022-10-19 MED ORDER — LEVOTHYROXINE SODIUM 112 MCG PO TABS: 112.0000 ug | ORAL_TABLET | Freq: Every day | ORAL | 3 refills | Status: DC

## 2022-10-19 NOTE — Patient Instructions (Addendum)
Please call and schedule bone density scan at the [facility]: [phone].   Please stop at the lab.  Continue Levothyroxine 112 mcg daily.  Take the thyroid hormone every day, with water, at least 30 minutes before breakfast, separated by at least 4 hours from: - acid reflux medications - calcium - iron - multivitamins  Continue: - Fosamax 70 mg weekly - vitamin D 5000 units daily  Please come back for a follow-up appointment in 1 year.

## 2022-10-19 NOTE — Progress Notes (Addendum)
Patient ID: Melinda Mckinney, female   DOB: 08-21-1955, 67 y.o.   MRN: [id number]  HPI  Melinda Mckinney is a 67 y.o.-year-old female, returning for f/u for vitamin D deficiency, osteoporosis, and hypothyroidism. Last visit 1 year and 1 month ago.  She is here with her husband who offers part of the history especially regarding her recent medical history, medication regimen and symptoms.  He also translates for [place].  Prev. Seen by Dr. Toney Rakes, now retired.  Interim history: No falls or fractures since last visit. No disequilibrium, vertigo, vision problems. Occasional dizziness - when changing position (orthostasis). She has back pain - chronic.  Osteoporosis.   Reviewed DXA scan reports: Date Lumbar spine L1-L4 Femoral neck (FN)  09/06/2018 ([facility]) -2.8 RFN: -1.5 LFN: -1.1   Previously: Date L1-L4 T score FN T score  09/04/2014 ([facility])  -2.8 LFN: -1.1 RFN: -1.2  03/25/2007 ([facility])  -2.6    She had a possible hand fracture in 03/2020.  This was not clearly confirmed.  Previously on Evista since 09/2017 but stopped in 2020 - prev. Rx'ed by Dr. Toney Rakes.   We started Fosamax 70 mg weekly in 08/2019.   She denies jaw/hip/thigh pain.  Vitamin D def  -Diagnosed in 2000 -No history of hypercalcemia  Reviewed her vitamin D levels: Lab Results  Component Value Date   VD25OH 58.09 08/26/2021   VD25OH 66.05 08/25/2020   VD25OH 45.46 08/22/2019   VD25OH 32.55 04/26/2018   VD25OH 40 09/08/2016   VD25OH 29.88 (L) 08/09/2016   VD25OH 24 (L) 09/03/2015   VD25OH 29.63 (L) 08/07/2015   VD25OH 26.02 (L) 01/19/2015   VD25OH 17 (L) 12/12/2014   She takes 5000 units vitamin daily.  Celiac panel was negative in 11/2014.  She has a history of abdominal pain/bloating/nausea/sometimes vomiting. She was seen by GI >> EGD >> had surgery for achalasia.  Lab Results  Component Value Date   PTH 31 09/12/2014   CALCIUM 9.5 09/16/2022   CALCIUM 8.9 05/09/2022   CALCIUM 9.6 04/28/2022    CALCIUM 9.9 02/09/2022   CALCIUM 9.3 11/04/2021   CALCIUM 9.7 10/26/2021   CALCIUM 9.7 08/12/2021   CALCIUM 9.7 07/07/2021   CALCIUM 10.2 05/05/2021   CALCIUM 9.5 05/04/2021   She has a history of kidney stones. R nephrectomy for hydronephrosis 2/2 kidney stone.  At that time, she also had surgery for incarcerated hernia - had several hernias  + CKD: Lab Results  Component Value Date   BUN 43 (A) 09/16/2022   CREATININE 1.7 (A) 09/16/2022   Hypothyroidism:  She is taking 112 mcg daily: - in am - fasting - at least 30 min from b'fast - no calcium - + iron - added 3-4h after LT4 - no multivitamins - + PPIs -first dose at least 3 hours after LT4 - not on Biotin  Reviewed her TFTs: Lab Results  Component Value Date   TSH 1.90 10/11/2022   TSH 0.71 04/28/2022   TSH 3.35 08/26/2021   TSH 0.72 06/22/2021   TSH 0.23 (L) 02/23/2021   TSH 0.94 08/25/2020   TSH 1.33 05/20/2020   TSH 0.18 (L) 03/18/2020   TSH 1.232 10/04/2019   TSH 5.07 (H) 08/22/2019   Pt denies: - feeling nodules in neck - hoarseness - choking She has a history of dysphagia, believed to be due to GERD.  She is on PPIs.   + FH with thyroid ds: Mother, daughter, and son. No FH of thyroid cancer. No h/o radiation tx to  head or neck. No herbal supplements. No Biotin use. No recent steroids use.   She also has a history of prior nephrectomy, osteoarthritis, A fib, history of acute CHF.  On metoprolol and Xarelto.  She had 2 previous cardioversions in the past.  ROS: + See HPI  I reviewed pt's medications, allergies, PMH, social hx, family hx, and changes were documented in the history of present illness. Otherwise, unchanged from my initial visit note.  Past Medical History:  Diagnosis Date   Anemia    Anxiety and depression    Asthma    Borderline diabetes    CAD (coronary artery disease) ~2009   stent    Cervical cancer ([place])    "stage 1; had cryotherapy"   CHF (congestive heart failure) (HCC)     Chronic lower back pain    Complete heart block ([place]) 06/2019   s/p AV nodal ablation and CRT-P by Dr Lovena Le   DDD (degenerative disc disease), lumbar    Dr. Nelva Bush, recommends lumbar epidural steroid injections L5-S1 to the right   Depression    Fibromyalgia    GERD (gastroesophageal reflux disease)    High cholesterol    History of hiatal hernia    History of kidney stones    Hypertension    Hypothyroidism    Osteoporosis    Permanent atrial fibrillation ([place]) 07/2017   s/p AV nodal ablation   Thyroid disease    Past Surgical History:  Procedure Laterality Date   AV NODE ABLATION N/A 06/24/2019   Procedure: AV NODE ABLATION;  Surgeon: Evans Lance, MD;  Location: [facility];  Service: Cardiovascular;  Laterality: N/A;   BIV PACEMAKER INSERTION CRT-P N/A 06/24/2019   Procedure: BIV PACEMAKER INSERTION CRT-P;  Surgeon: Evans Lance, MD;  Location: [facility];  Service: Cardiovascular;  Laterality: N/A;   CARDIAC CATHETERIZATION  ~ 2008   CARDIOVERSION N/A 08/05/2017   Procedure: CARDIOVERSION;  Surgeon: Evans Lance, MD;  Location[facility] OR;  Service: Cardiovascular;  Laterality: N/A;   CARDIOVERSION N/A 08/24/2017   Procedure: CARDIOVERSION;  Surgeon: Sanda Klein, MD;  Location: [facility];  Service: Cardiovascular;  Laterality: N/A;   CARDIOVERSION N/A 06/19/2019   Procedure: CARDIOVERSION;  Surgeon: Larey Dresser, MD;  Location: [facility];  Service: Cardiovascular;  Laterality: N/A;   CARDIOVERSION N/A 06/21/2019   Procedure: CARDIOVERSION;  Surgeon: Larey Dresser, MD;  Location: [facility] END[facility]COPY;  Service: Cardiovascular;  Laterality: N/A;   [place]; ~ 1978/1979; 1984   ESOPHAGOMYOTOMY  2009   GYNECOLOGIC CRYOSURGERY  X 2   "stage 1 cancer"   HERNIA REPAIR  X 6   "all in my stomach" (07/27/2017)   KNEE ARTHROSCOPY Right    NEPHRECTOMY Right 1991   in [place], due to fibrosis and lithiasis   RIGHT/LEFT HEART CATH AND CORONARY  ANGIOGRAPHY N/A 06/17/2019   Procedure: RIGHT/LEFT HEART CATH AND CORONARY ANGIOGRAPHY;  Surgeon: Lorretta Harp, MD;  Location: [facility];  Service: Cardiovascular;  Laterality: N/A;   TEE WITHOUT CARDIOVERSION N/A 06/19/2019   Procedure: TRANSESOPHAGEAL ECHOCARDIOGRAM (TEE);  Surgeon: Larey Dresser, MD;  Location: [facility] OR;  Service: Cardiovascular;  Laterality: N/A;   TUBAL LIGATION     Social History   Socioeconomic History   Marital status: Married    Spouse name: Not on file   Number of children: 3   Years of education: Not on file   Highest education level: Not on  file  Occupational History   Occupation: stay home   Tobacco Use   Smoking status: Former    Packs/day: 2.00    Years: 28.00    Total pack years: 56.00    Types: Cigarettes    Quit date: 2000    Years since quitting: 24.1   Smokeless tobacco: Never  Vaping Use   Vaping Use: Never used  Substance and Sexual Activity   Alcohol use: Yes    Alcohol/week: 5.0 standard drinks of alcohol    Types: 5 Cans of beer per week    Comment: 07/27/2017 "3-4 drinks//month"   Drug use: No   Sexual activity: Not Currently    Comment: 1ST INTERCOURSE- 16, PARTNERS - 2  Other Topics Concern   Not on file  Social History Narrative   Original from [place]   3 children, 2 alive   Household --pt and husband    Social Determinants of Health   Financial Resource Strain: Low Risk  (11/29/2021)   Overall Financial Resource Strain (CARDIA)    Difficulty of Paying Living Expenses: Not hard at all  Food Insecurity: No Food Insecurity (11/29/2021)   Hunger Vital Sign    Worried About Running Out of Food in the Last Year: Never true    [place] in the Last Year: Never true  Transportation Needs: Unknown (11/29/2021)   PRAPARE - Hydrologist (Medical): No    Lack of Transportation (Non-Medical): Not on file  Physical Activity: Inactive (11/29/2021)   Exercise Vital Sign    Days of  Exercise per Week: 0 days    Minutes of Exercise per Session: 0 min  Stress: No Stress Concern Present (11/29/2021)   [place]    Feeling of Stress : Not at all  Social Connections: Moderately Integrated (11/29/2021)   Social Connection and Isolation Panel [NHANES]    Frequency of Communication with Friends and Family: More than three times a week    Frequency of Social Gatherings with Friends and Family: More than three times a week    Attends Religious Services: Never    Marine scientist or Organizations: Yes    Attends Music therapist: More than 4 times per year    Marital Status: Married  Human resources officer Violence: Not At Risk (11/29/2021)   Humiliation, Afraid, Rape, and Kick questionnaire    Fear of Current or Ex-Partner: No    Emotionally Abused: No    Physically Abused: No    Sexually Abused: No   Current Outpatient Medications on File Prior to Visit  Medication Sig Dispense Refill   alendronate (FOSAMAX) 70 MG tablet Take 1 tablet weekly with a full glass of water on an empty stomach 15 tablet 1   allopurinol (ZYLOPRIM) 100 MG tablet Take 1 tablet (100 mg total) by mouth daily. 90 tablet 1   atorvastatin (LIPITOR) 40 MG tablet Take 1.5 tablets (60 mg total) by mouth daily. 135 tablet 3   carvedilol (COREG) 6.25 MG tablet TAKE ONE TABLET BY MOUTH TWICE A DAY WITH A MEAL 180 tablet 3   Cholecalciferol (VITAMIN D3) 5000 UNITS CAPS Take 5,000 Units by mouth daily.      clobetasol cream (TEMOVATE) 0.05 % Apply topically 2 (two) times daily as needed. 60 g 1   cyclobenzaprine (FLEXERIL) 10 MG tablet Take 1 tablet (10 mg total) by mouth 2 (two) times daily as needed for muscle  spasms. 60 tablet 1   DULoxetine (CYMBALTA) 60 MG capsule TAKE 1 CAPSULE BY MOUTH EVERY DAY 90 capsule 1   FARXIGA 10 MG TABS tablet TAKE 1 TABLET BY MOUTH DAILY BEFORE BREAKFAST 30 tablet 11   ferrous fumarate (HEMOCYTE - 106  MG FE) 325 (106 Fe) MG TABS tablet Take 1 tablet (106 mg of iron total) by mouth 2 (two) times daily. Do not take at the same time as omeprazole (Prilosec) 60 tablet 6   ketoconazole (NIZORAL) 2 % cream Apply 1 application topically daily. 60 g 0   levothyroxine (SYNTHROID) 112 MCG tablet Take 1 tablet (112 mcg total) by mouth daily before breakfast. 90 tablet 1   metFORMIN (GLUCOPHAGE) 500 MG tablet Take 1 tablet (500 mg total) by mouth 2 (two) times daily with a meal. 180 tablet 1   omeprazole (PRILOSEC) 20 MG capsule Take 1 capsule (20 mg total) by mouth 2 (two) times daily before a meal. 180 capsule 1   sacubitril-valsartan (ENTRESTO) 49-51 MG Take 1 tablet by mouth 2 (two) times daily. 60 tablet 5   spironolactone (ALDACTONE) 25 MG tablet Take 0.5 tablets (12.5 mg total) by mouth daily. 90 tablet 3   torsemide (DEMADEX) 20 MG tablet Take 2 tablets (40 mg total) by mouth daily. 180 tablet 3   triamcinolone (KENALOG) 0.025 % ointment Apply topically as needed.     XARELTO 15 MG TABS tablet TAKE 1 TABLET BY MOUTH EVERY DAY WITH SUPPER 90 tablet 1   No current facility-administered medications on file prior to visit.   Allergies  Allergen Reactions   Penicillins Swelling and Rash    Has patient had a PCN reaction causing immediate rash, facial/tongue/throat swelling, SOB or lightheadedness with hypotension: No Has patient had a PCN reaction causing severe rash involving mucus membranes or skin necrosis: No Has patient had a PCN reaction that required hospitalization: No Has patient had a PCN reaction occurring within the last 10 years: No If all of the above answers are "NO", then may proceed with Cephalosporin use.   Family History  Problem Relation Age of Onset   Breast cancer Other        aunt    CAD Brother    Lung cancer Mother        F and M   Diabetes Father        F and mother    CAD Father    Lung cancer Father    Stroke Sister    Colon cancer Neg Hx    PE: BP 120/72  (BP Location: Right Arm, Patient Position: Sitting, Cuff Size: Normal)   Pulse 89   Ht '5\' 5"'$  (1.651 m)   Wt 217 lb 6.4 oz (98.6 kg)   SpO2 97%   BMI 36.18 kg/m   Wt Readings from Last 3 Encounters:  10/19/22 217 lb 6.4 oz (98.6 kg)  10/11/22 217 lb 8 oz (98.7 kg)  06/13/22 220 lb (99.8 kg)   Constitutional: overweight, in NAD Eyes: EOMI, no exophthalmos ENT: no thyromegaly, no cervical lymphadenopathy Cardiovascular: RRR, No MRG Respiratory: CTA B Musculoskeletal: no deformities Skin: No rashes Neurological: + tremor with outstretched hands  Assessment: 1.  Osteoporosis  2. Vitamin D deficiency -Reviewing her chart, there is no obvious reason for malabsorption: Celiac workup was negative, no colon diseases, she had a history of Barrett's esophagus and has surveillance EGDs and also had achalasia surgery  3. Hypothyroidism  PLAN: 1. Osteoporosis -No history of fragility fractures (she  was investigated for hand fracture in 03/2020 -we discussed that this does not qualify as a fragility fracture) -No falls or fractures since last visit -I reviewed the report of her DXA scan from 08/2018 and this showed that the lumbar spine T-score was low, at -2.8, but stable.  The right femoral neck BMD was lower than before, however, the last 2 bone density scans were done on different machines, so they were not directly comparable.  She still did not have the bone density scan that I ordered at last 2 visits. -She was previously on Evista for 4 years but we started alendronate in 08/2019, which she tolerates well.  No thigh/hip/jaw pain.  She is aware that she needs to take this before levothyroxine. I refilled the Fosamax for her. -We did discuss about the importance of exercise -We will check a vitamin D level today  2.  Vitamin D insufficiency -She was previously on high-dose ergocalciferol once a week in the past, but currently on daily cholecalciferol -She continues on 5000 units vitamin  D daily -At last visit, vitamin D level was excellent, at 58.09 -We will repeat the level today  3. Hypothyroidism - latest thyroid labs reviewed with pt. >> normal: Lab Results  Component Value Date   TSH 1.90 10/11/2022  - she continues on LT4 112 mcg daily - pt feels good on this dose. - we discussed about taking the thyroid hormone every day, with water, >30 minutes before breakfast, separated by >4 hours from acid reflux medications, calcium, iron, multivitamins. Pt. is taking it correctly. - I refilled her LT4   Component     Latest Ref Rng 10/19/2022  VITD     30.00 - 100.00 ng/mL 49.21   Vitamin D level is at goal.   10/19/2022 Lumbar spine L2-L4 (L1,L3) Femoral neck (FN)  T-score -3.3 RFN: -1.0 LFN: -1.6  Change in BMD from previous DXA test (%) -2.9% -0.2%  Nonsignificant changes at the level of the spine (but only 2 vertebrae analyzed) and the femoral necks.  We can continue Fosamax for now.  Philemon Kingdom, MD PhD [facility] Endocrinology

## 2022-10-24 ENCOUNTER — Ambulatory Visit (INDEPENDENT_AMBULATORY_CARE_PROVIDER_SITE_OTHER)
Admission: RE | Admit: 2022-10-24 | Discharge: 2022-10-24 | Disposition: A | Discharge status: Home / Self Care | Payer: Medicare Other | Source: Ambulatory Visit | Attending: Internal Medicine | Admitting: Internal Medicine

## 2022-10-24 DIAGNOSIS — M810 Age-related osteoporosis without current pathological fracture: Secondary | ICD-10-CM

## 2022-10-28 ENCOUNTER — Telehealth: Payer: Self-pay

## 2022-10-28 NOTE — Progress Notes (Signed)
[facility]) [place]   10/28/2022  Astha Munsen Jul 01, 1956 [id number]  Reason for referral:  Quality measure   Referral source:  Payor Current insurance: [facility]  Reason for call: Atorvastatin Overdue for Refills   Outreach:  Unsuccessful telephone call attempt #1 to patient.   Unable to leave message  Plan:  -I will make another outreach attempt to patient in 2-4 weeks.  Thank you for allowing [facility] pharmacy to be a part of this patient's care. Kristeen Miss, [name] Clinical Pharmacist [place] Cell: [phone]

## 2022-11-03 ENCOUNTER — Other Ambulatory Visit: Payer: Self-pay | Admitting: Internal Medicine

## 2022-11-07 ENCOUNTER — Other Ambulatory Visit (HOSPITAL_COMMUNITY): Payer: Self-pay | Admitting: Internal Medicine

## 2022-11-07 ENCOUNTER — Ambulatory Visit: Payer: Medicare Other | Attending: Internal Medicine

## 2022-11-07 DIAGNOSIS — I5022 Chronic systolic (congestive) heart failure: Secondary | ICD-10-CM

## 2022-11-07 DIAGNOSIS — Z950 Presence of cardiac pacemaker: Secondary | ICD-10-CM

## 2022-11-08 NOTE — Progress Notes (Signed)
EPIC Encounter for ICM Monitoring  Patient Name: Melinda Mckinney is a 67 y.o. female Date: 11/08/2022 Primary Care Physican: Colon Branch, MD Primary Cardiologist: Aundra Dubin Electrophysiologist: Santina Evans Pacing: 99.7%         02/08/2022 Weight: 214 lbs 10/05/2022 Weight: 209 lbs   Time in AT/AF  24.0 hr/day (100.0%)             Spoke with husband and heart failure questions reviewed.  Transmission results reviewed.  Pt asymptomatic for fluid accumulation.  Reports feeling well at this time and voices no complaints.     Diet:  Does not follow low salt diet.  She eats breakfast at restaurant daily.  Encouraged to review food labels for salt content and avoid using salt shaker.   Optivol thoracic impedance suggesting possible fluid accumulation starting 2/19 and returned to baseline 3/24.   Fluid index > normal threshold starting 2/26.   Prescribed:  Torsemide 20 mg take 2 tablets (40 mg total) daily.  Spironolactone 25 mg take 1 tablet daily Farxiga 10 mg take 1 tablet daily   Labs: 02/09/2022 Creatinine 1.87, UN 49, Potassium 4.6, Sodium 139, GFR 29 11/04/2021 Creatinine 1.5,   BUN 27, Potassium 5.2, Sodium 142 10/26/2021 Creatinine 1.62, BUN 31, Potassium 4.7, Sodium 140  A complete set of results can be found in Results Review.   Recommendations:  Recommendation to limit salt intake to 2000 mg daily and fluid intake to 64 oz daily.  Encouraged to call if experiencing any fluid symptoms.    Follow-up plan: ICM clinic phone appointment on 12/12/2022.   91 day device clinic remote transmission 01/02/2023.     EP/Cardiology Office Visits:  Recall 06/30/2022 with Dr Aundra Dubin.     Recall 06/08/2023 with Dr Myles Gip.       Copy of ICM check sent to Dr. Lovena Le.     3 month ICM trend: 11/07/2022.    12-14 Month ICM trend:     Rosalene Billings, RN 11/08/2022 4:26 PM

## 2022-11-14 NOTE — Progress Notes (Signed)
Remote pacemaker transmission.   

## 2022-11-15 ENCOUNTER — Other Ambulatory Visit: Payer: Self-pay | Admitting: Internal Medicine

## 2022-11-16 ENCOUNTER — Other Ambulatory Visit (HOSPITAL_COMMUNITY): Payer: Self-pay

## 2022-12-06 ENCOUNTER — Other Ambulatory Visit: Payer: Self-pay | Admitting: Internal Medicine

## 2022-12-06 DIAGNOSIS — M810 Age-related osteoporosis without current pathological fracture: Secondary | ICD-10-CM

## 2022-12-07 DIAGNOSIS — R3915 Urgency of urination: Secondary | ICD-10-CM | POA: Diagnosis not present

## 2022-12-12 ENCOUNTER — Ambulatory Visit: Payer: Medicare Other | Attending: Internal Medicine

## 2022-12-12 DIAGNOSIS — Z950 Presence of cardiac pacemaker: Secondary | ICD-10-CM | POA: Diagnosis not present

## 2022-12-12 DIAGNOSIS — I5022 Chronic systolic (congestive) heart failure: Secondary | ICD-10-CM

## 2022-12-13 NOTE — Progress Notes (Signed)
EPIC Encounter for ICM Monitoring  Patient Name: Melinda Mckinney is a 67 y.o. female Date: 12/13/2022 Primary Care Physican: Wanda Plump, MD Primary Cardiologist: Shirlee Latch Electrophysiologist: Rae Roam Pacing: 99.7%         02/08/2022 Weight: 214 lbs 10/05/2022 Weight: 209 lbs   Time in AT/AF  24.0 hr/day (100.0%)             Spoke with husband and heart failure questions reviewed.  Transmission results reviewed.  Pt asymptomatic for fluid accumulation.  Reports feeling well at this time and voices no complaints.     Diet:  Does not follow low salt diet.  She eats breakfast at restaurant daily.  Encouraged to review food labels for salt content and avoid using salt shaker.    Optivol thoracic impedance suggesting possible fluid accumulation from 3/23-3/27, 3/30-4/12 and 4/16-25.      Prescribed:  Torsemide 20 mg take 2 tablets (40 mg total) daily.  Spironolactone 25 mg take 1 tablet daily Farxiga 10 mg take 1 tablet daily   Labs: 02/09/2022 Creatinine 1.87, UN 49, Potassium 4.6, Sodium 139, GFR 29 11/04/2021 Creatinine 1.5,   BUN 27, Potassium 5.2, Sodium 142 10/26/2021 Creatinine 1.62, BUN 31, Potassium 4.7, Sodium 140  A complete set of results can be found in Results Review.   Recommendations: Advised to have patient limit salt intake to 2000 mg daily and fluid intake to 64 oz daily.  Encouraged to call if experiencing any fluid symptoms.    Follow-up plan: ICM clinic phone appointment on 01/16/2023.   91 day device clinic remote transmission 01/02/2023.     EP/Cardiology Office Visits:  Recall 06/30/2022 with Dr Shirlee Latch.     Recall 06/08/2023 with Dr Nelly Laurence.       Copy of ICM check sent to Dr. Ladona Ridgel.     3 month ICM trend: 12/12/2022.    12-14 Month ICM trend:     Karie Soda, RN 12/13/2022 4:37 PM

## 2022-12-22 DIAGNOSIS — R1031 Right lower quadrant pain: Secondary | ICD-10-CM | POA: Diagnosis not present

## 2022-12-22 DIAGNOSIS — B3731 Acute candidiasis of vulva and vagina: Secondary | ICD-10-CM | POA: Diagnosis not present

## 2022-12-22 DIAGNOSIS — R1084 Generalized abdominal pain: Secondary | ICD-10-CM | POA: Diagnosis not present

## 2023-01-02 ENCOUNTER — Ambulatory Visit (INDEPENDENT_AMBULATORY_CARE_PROVIDER_SITE_OTHER): Payer: Medicare Other

## 2023-01-02 DIAGNOSIS — I428 Other cardiomyopathies: Secondary | ICD-10-CM

## 2023-01-02 LAB — CUP PACEART REMOTE DEVICE CHECK
Battery Remaining Longevity: 62 mo
Battery Voltage: 2.96 V
Brady Statistic RA Percent Paced: 0 %
Brady Statistic RV Percent Paced: 99.91 %
Date Time Interrogation Session: 20240520055942
Implantable Lead Connection Status: 753985
Implantable Lead Connection Status: 753985
Implantable Lead Connection Status: 753985
Implantable Lead Implant Date: 20201109
Implantable Lead Implant Date: 20201109
Implantable Lead Implant Date: 20201109
Implantable Lead Location: 753858
Implantable Lead Location: 753859
Implantable Lead Location: 753860
Implantable Lead Model: 4598
Implantable Lead Model: 5076
Implantable Lead Model: 5076
Implantable Pulse Generator Implant Date: 20201109
Lead Channel Impedance Value: 1102 Ohm
Lead Channel Impedance Value: 1273 Ohm
Lead Channel Impedance Value: 1292 Ohm
Lead Channel Impedance Value: 361 Ohm
Lead Channel Impedance Value: 380 Ohm
Lead Channel Impedance Value: 418 Ohm
Lead Channel Impedance Value: 418 Ohm
Lead Channel Impedance Value: 513 Ohm
Lead Channel Impedance Value: 608 Ohm
Lead Channel Impedance Value: 608 Ohm
Lead Channel Impedance Value: 741 Ohm
Lead Channel Impedance Value: 836 Ohm
Lead Channel Impedance Value: 874 Ohm
Lead Channel Impedance Value: 893 Ohm
Lead Channel Pacing Threshold Amplitude: 0.75 V
Lead Channel Pacing Threshold Amplitude: 1.25 V
Lead Channel Pacing Threshold Pulse Width: 0.4 ms
Lead Channel Pacing Threshold Pulse Width: 0.8 ms
Lead Channel Sensing Intrinsic Amplitude: 0.75 mV
Lead Channel Sensing Intrinsic Amplitude: 0.75 mV
Lead Channel Sensing Intrinsic Amplitude: 9.125 mV
Lead Channel Sensing Intrinsic Amplitude: 9.125 mV
Lead Channel Setting Pacing Amplitude: 2.5 V
Lead Channel Setting Pacing Amplitude: 2.5 V
Lead Channel Setting Pacing Pulse Width: 0.4 ms
Lead Channel Setting Pacing Pulse Width: 0.8 ms
Lead Channel Setting Sensing Sensitivity: 2 mV
Zone Setting Status: 755011
Zone Setting Status: 755011

## 2023-01-05 DIAGNOSIS — R1084 Generalized abdominal pain: Secondary | ICD-10-CM | POA: Diagnosis not present

## 2023-01-05 DIAGNOSIS — I4891 Unspecified atrial fibrillation: Secondary | ICD-10-CM | POA: Diagnosis not present

## 2023-01-05 DIAGNOSIS — I25119 Atherosclerotic heart disease of native coronary artery with unspecified angina pectoris: Secondary | ICD-10-CM | POA: Diagnosis not present

## 2023-01-05 DIAGNOSIS — I150 Renovascular hypertension: Secondary | ICD-10-CM | POA: Diagnosis not present

## 2023-01-05 DIAGNOSIS — E785 Hyperlipidemia, unspecified: Secondary | ICD-10-CM | POA: Diagnosis not present

## 2023-01-05 DIAGNOSIS — E119 Type 2 diabetes mellitus without complications: Secondary | ICD-10-CM | POA: Diagnosis not present

## 2023-01-05 DIAGNOSIS — I509 Heart failure, unspecified: Secondary | ICD-10-CM | POA: Diagnosis not present

## 2023-01-16 ENCOUNTER — Ambulatory Visit: Payer: Medicare Other | Attending: Internal Medicine

## 2023-01-16 DIAGNOSIS — Z950 Presence of cardiac pacemaker: Secondary | ICD-10-CM

## 2023-01-16 DIAGNOSIS — I5022 Chronic systolic (congestive) heart failure: Secondary | ICD-10-CM

## 2023-01-16 NOTE — Progress Notes (Unsigned)
EPIC Encounter for ICM Monitoring  Patient Name: Melinda Mckinney is a 67 y.o. female Date: 01/16/2023 Primary Care Physican: Wanda Plump, MD Primary Cardiologist: Shirlee Latch Electrophysiologist: Rae Roam Pacing: 99.8%         02/08/2022 Weight: 214 lbs 10/05/2022 Weight: 209 lbs 01/16/2023 Weight: 215 lbs    Time in AT/AF  24.0 hr/day (100.0%)             Spoke with husband and heart failure questions reviewed.  Transmission results reviewed.  He reports patient is asymptomatic for fluid accumulation.  Reports feeling well at this time and voices no complaints.     Diet:  Does not follow low salt diet.     Optivol thoracic impedance suggesting possible fluid accumulation starting 5/4.   Fluid index > normal threshold starting 5/10.   Prescribed:  Torsemide 20 mg take 2 tablets (40 mg total) daily.  Spironolactone 25 mg take 1 tablet daily Farxiga 10 mg take 1 tablet daily   Labs: 02/09/2022 Creatinine 1.87, BUN 49, Potassium 4.6, Sodium 139, GFR 29 11/04/2021 Creatinine 1.5,   BUN 27, Potassium 5.2, Sodium 142 10/26/2021 Creatinine 1.62, BUN 31, Potassium 4.7, Sodium 140  A complete set of results can be found in Results Review.   Recommendations:  Copy sent to Dr Shirlee Latch for review and recommendations if needed.  Confirmed she is taking Torsemide 40 mg daily.     Follow-up plan: ICM clinic phone appointment on 01/23/2023 to recheck fluid levels.   91 day device clinic remote transmission 04/03/2023.     EP/Cardiology Office Visits:  Advised to call Dr Alford Highland office to schedule overdue appointment.  Recall 06/30/2022 with Dr Shirlee Latch (4 month f/u).     Recall 06/08/2023 with Dr Nelly Laurence.       Copy of ICM check sent to Dr. Ladona Ridgel.     3 month ICM trend: 01/16/2023.    12-14 Month ICM trend:     Karie Soda, RN 01/16/2023 12:47 PM

## 2023-01-16 NOTE — Progress Notes (Signed)
Increase torsemide to 40 qam/20 qpm and repeat BMET in 1 week.

## 2023-01-17 DIAGNOSIS — I10 Essential (primary) hypertension: Secondary | ICD-10-CM | POA: Diagnosis not present

## 2023-01-17 DIAGNOSIS — E039 Hypothyroidism, unspecified: Secondary | ICD-10-CM | POA: Diagnosis not present

## 2023-01-17 DIAGNOSIS — I4891 Unspecified atrial fibrillation: Secondary | ICD-10-CM | POA: Diagnosis not present

## 2023-01-17 DIAGNOSIS — E119 Type 2 diabetes mellitus without complications: Secondary | ICD-10-CM | POA: Diagnosis not present

## 2023-01-17 DIAGNOSIS — M810 Age-related osteoporosis without current pathological fracture: Secondary | ICD-10-CM | POA: Diagnosis not present

## 2023-01-17 DIAGNOSIS — E559 Vitamin D deficiency, unspecified: Secondary | ICD-10-CM | POA: Diagnosis not present

## 2023-01-17 DIAGNOSIS — I5020 Unspecified systolic (congestive) heart failure: Secondary | ICD-10-CM | POA: Diagnosis not present

## 2023-01-17 DIAGNOSIS — I152 Hypertension secondary to endocrine disorders: Secondary | ICD-10-CM | POA: Diagnosis not present

## 2023-01-17 DIAGNOSIS — E538 Deficiency of other specified B group vitamins: Secondary | ICD-10-CM | POA: Diagnosis not present

## 2023-01-17 DIAGNOSIS — I110 Hypertensive heart disease with heart failure: Secondary | ICD-10-CM | POA: Diagnosis not present

## 2023-01-17 MED ORDER — TORSEMIDE 20 MG PO TABS: ORAL_TABLET | ORAL | 3 refills | Status: DC

## 2023-01-17 NOTE — Progress Notes (Signed)
Spoke with husband and advised Dr Shirlee Latch ordered to change Torsemide dosage to 2 tablets (40 mg total) by mouth every morning and 1 tablet (20 mg total) by mouth every evening.   BMET scheduled for 6/11 at HF clinic.  Husband verbalized understanding and escript sent to preferred pharmacy [facility].  Advised to call back with any questions.

## 2023-01-18 DIAGNOSIS — E119 Type 2 diabetes mellitus without complications: Secondary | ICD-10-CM | POA: Diagnosis not present

## 2023-01-18 DIAGNOSIS — E039 Hypothyroidism, unspecified: Secondary | ICD-10-CM | POA: Diagnosis not present

## 2023-01-18 DIAGNOSIS — I10 Essential (primary) hypertension: Secondary | ICD-10-CM | POA: Diagnosis not present

## 2023-01-18 DIAGNOSIS — E559 Vitamin D deficiency, unspecified: Secondary | ICD-10-CM | POA: Diagnosis not present

## 2023-01-18 DIAGNOSIS — E538 Deficiency of other specified B group vitamins: Secondary | ICD-10-CM | POA: Diagnosis not present

## 2023-01-20 DIAGNOSIS — I700 Atherosclerosis of aorta: Secondary | ICD-10-CM | POA: Diagnosis not present

## 2023-01-20 DIAGNOSIS — R109 Unspecified abdominal pain: Secondary | ICD-10-CM | POA: Diagnosis not present

## 2023-01-20 DIAGNOSIS — R1084 Generalized abdominal pain: Secondary | ICD-10-CM | POA: Diagnosis not present

## 2023-01-23 ENCOUNTER — Ambulatory Visit: Payer: Medicare Other | Attending: Internal Medicine

## 2023-01-23 DIAGNOSIS — I5022 Chronic systolic (congestive) heart failure: Secondary | ICD-10-CM

## 2023-01-23 DIAGNOSIS — Z950 Presence of cardiac pacemaker: Secondary | ICD-10-CM

## 2023-01-24 ENCOUNTER — Ambulatory Visit (HOSPITAL_COMMUNITY)
Admission: RE | Admit: 2023-01-24 | Discharge: 2023-01-24 | Disposition: A | Discharge status: Home / Self Care | Payer: Medicare Other | Source: Ambulatory Visit | Attending: Cardiology | Admitting: Cardiology

## 2023-01-24 DIAGNOSIS — I5022 Chronic systolic (congestive) heart failure: Secondary | ICD-10-CM | POA: Insufficient documentation

## 2023-01-24 LAB — BASIC METABOLIC PANEL
Anion gap: 8 (ref 5–15)
BUN: 22 mg/dL (ref 8–23)
CO2: 29 mmol/L (ref 22–32)
Calcium: 9.3 mg/dL (ref 8.9–10.3)
Chloride: 102 mmol/L (ref 98–111)
Creatinine, Ser: 1.54 mg/dL — ABNORMAL HIGH (ref 0.44–1.00)
GFR, Estimated: 36 mL/min — ABNORMAL LOW (ref >60)
Glucose, Bld: 114 mg/dL — ABNORMAL HIGH (ref 70–99)
Potassium: 3.6 mmol/L (ref 3.5–5.1)
Sodium: 139 mmol/L (ref 135–145)

## 2023-01-24 NOTE — Progress Notes (Signed)
EPIC Encounter for ICM Monitoring  Patient Name: Melinda Mckinney is a 67 y.o. female Date: 01/24/2023 Primary Care Physican: Wanda Plump, MD Primary Cardiologist: Shirlee Latch Electrophysiologist: Rae Roam Pacing: 99.8%         02/08/2022 Weight: 214 lbs 10/05/2022 Weight: 209 lbs 01/16/2023 Weight: 215 lbs    Time in AT/AF  24.0 hr/day (100.0%)             Spoke with husband and heart failure questions reviewed.  Transmission results reviewed.  He reports patient is asymptomatic for fluid accumulation.  Reports feeling well at this time and voices no complaints.     Diet:  Does not follow low salt diet.     Optivol thoracic impedance suggesting fluid levels returned to normal after Torsemide increased to 40 mg AM/20 mg PM .   Fluid index > normal threshold starting 5/10.   Prescribed:  Torsemide 20 mg take 2 tablets (40 mg total) by mouth every morning and 20 mg by mouth every evening.  Spironolactone 25 mg take 1 tablet daily Farxiga 10 mg take 1 tablet daily   Labs: 01/24/2023 Creatinine 1.54, BUN 22, Potassium 3.6, Sodium 139, GFR 36 02/09/2022 Creatinine 1.87, BUN 49, Potassium 4.6, Sodium 139, GFR 29 11/04/2021 Creatinine 1.5,   BUN 27, Potassium 5.2, Sodium 142 10/26/2021 Creatinine 1.62, BUN 31, Potassium 4.7, Sodium 140  A complete set of results can be found in Results Review.   Recommendations:  No changes and encouraged to call if experiencing any fluid symptoms.   Follow-up plan: ICM clinic phone appointment on 02/20/2023.   91 day device clinic remote transmission 04/03/2023.     EP/Cardiology Office Visits:  Aware to call Dr Alford Highland office to schedule overdue appointment.  Recall 06/30/2022 with Dr Shirlee Latch (4 month f/u).     Recall 06/08/2023 with Dr Nelly Laurence.       Copy of ICM check sent to Dr. Ladona Ridgel.    3 month ICM trend: 01/23/2023.    12-14 Month ICM trend:     Karie Soda, RN 01/24/2023 12:01 PM

## 2023-01-27 NOTE — Progress Notes (Signed)
Remote pacemaker transmission.   

## 2023-02-10 ENCOUNTER — Encounter: Payer: Medicare Other | Admitting: Internal Medicine

## 2023-02-14 DIAGNOSIS — R1084 Generalized abdominal pain: Secondary | ICD-10-CM | POA: Diagnosis not present

## 2023-02-14 DIAGNOSIS — E785 Hyperlipidemia, unspecified: Secondary | ICD-10-CM | POA: Diagnosis not present

## 2023-02-14 DIAGNOSIS — I25119 Atherosclerotic heart disease of native coronary artery with unspecified angina pectoris: Secondary | ICD-10-CM | POA: Diagnosis not present

## 2023-02-14 DIAGNOSIS — I110 Hypertensive heart disease with heart failure: Secondary | ICD-10-CM | POA: Diagnosis not present

## 2023-02-14 DIAGNOSIS — E119 Type 2 diabetes mellitus without complications: Secondary | ICD-10-CM | POA: Diagnosis not present

## 2023-02-14 DIAGNOSIS — I4891 Unspecified atrial fibrillation: Secondary | ICD-10-CM | POA: Diagnosis not present

## 2023-02-14 DIAGNOSIS — I509 Heart failure, unspecified: Secondary | ICD-10-CM | POA: Diagnosis not present

## 2023-02-15 DIAGNOSIS — M87051 Idiopathic aseptic necrosis of right femur: Secondary | ICD-10-CM | POA: Diagnosis not present

## 2023-02-17 ENCOUNTER — Other Ambulatory Visit (HOSPITAL_COMMUNITY): Payer: Self-pay

## 2023-02-17 ENCOUNTER — Other Ambulatory Visit: Payer: Self-pay | Admitting: Internal Medicine

## 2023-02-17 DIAGNOSIS — I5022 Chronic systolic (congestive) heart failure: Secondary | ICD-10-CM

## 2023-02-17 MED ORDER — FARXIGA 10 MG PO TABS
10.0000 mg | ORAL_TABLET | Freq: Every day | ORAL | 11 refills | Status: DC
Start: 2023-02-17 — End: 2024-02-27

## 2023-02-17 MED ORDER — DAPAGLIFLOZIN PROPANEDIOL 10 MG PO TABS: 10.0000 mg | ORAL_TABLET | Freq: Every day | ORAL | 0 refills | Status: DC

## 2023-02-17 NOTE — Addendum Note (Signed)
Addended by: Benjy Kana, Milagros Reap on: 02/17/2023 04:04 PM   Modules accepted: Orders

## 2023-02-20 ENCOUNTER — Ambulatory Visit: Payer: Medicare Other | Attending: Internal Medicine

## 2023-02-20 DIAGNOSIS — Z950 Presence of cardiac pacemaker: Secondary | ICD-10-CM | POA: Diagnosis not present

## 2023-02-20 DIAGNOSIS — I5022 Chronic systolic (congestive) heart failure: Secondary | ICD-10-CM

## 2023-02-20 NOTE — Progress Notes (Signed)
Increase torsemide to 40 mg bid.  BMET 1 week.  Will need appointment with APP in HF office.

## 2023-02-20 NOTE — Progress Notes (Unsigned)
EPIC Encounter for ICM Monitoring  Patient Name: Melinda Mckinney is a 67 y.o. female Date: 02/20/2023 Primary Care Physican: Wanda Plump, MD Primary Cardiologist: Shirlee Latch Electrophysiologist: Rae Roam Pacing: 99.4%         02/08/2022 Weight: 214 lbs 10/05/2022 Weight: 209 lbs 01/16/2023 Weight: 215 lbs    Time in AT/AF  24.0 hr/day (100.0%)             Spoke with husband to interpret and heart failure questions reviewed.  Transmission results reviewed.  Pt asymptomatic for fluid accumulation.  Reports feeling well at this time and voices no complaints.  He reports Spironolactone has fallen off her list of meds and she has not been taking and would like a refill.   Diet:  Does not follow low salt diet.     Optivol thoracic impedance suggesting possible fluid accumulation starting 6/23.   Difficulty maintaining normal fluid levels in the past year.   Prescribed:  Torsemide 20 mg take 2 tablets (40 mg total) by mouth every morning and 20 mg by mouth every evening.  Spironolactone 25 mg take 1 tablet daily Farxiga 10 mg take 1 tablet daily   Labs: 01/24/2023 Creatinine 1.54, BUN 22, Potassium 3.6, Sodium 139, GFR 36 02/09/2022 Creatinine 1.87, BUN 49, Potassium 4.6, Sodium 139, GFR 29 11/04/2021 Creatinine 1.5,   BUN 27, Potassium 5.2, Sodium 142 10/26/2021 Creatinine 1.62, BUN 31, Potassium 4.7, Sodium 140  A complete set of results can be found in Results Review.   Recommendations:  Advised will send copy to Dr Shirlee Latch to review and provide recommendations if needed.  Also advised to call Dr Alford Highland office to schedule overdue appt and provided office number.    Follow-up plan: ICM clinic phone appointment on 02/27/2023 to recheck fluid levels.   91 day device clinic remote transmission 04/03/2023.     EP/Cardiology Office Visits:   Recall 06/30/2022 with Dr Shirlee Latch (4 month f/u).     Recall 06/08/2023 with Dr Nelly Laurence.       Copy of ICM check sent to Dr. Ladona Ridgel.     3 month ICM trend:  02/20/2023.    12-14 Month ICM trend:     Karie Soda, RN 02/20/2023 10:10 AM

## 2023-02-21 DIAGNOSIS — M87051 Idiopathic aseptic necrosis of right femur: Secondary | ICD-10-CM | POA: Diagnosis not present

## 2023-02-21 MED ORDER — TORSEMIDE 20 MG PO TABS: 40.0000 mg | ORAL_TABLET | Freq: Two times a day (BID) | ORAL | 3 refills | Status: DC

## 2023-02-21 NOTE — Progress Notes (Signed)
Pt currently scheduled for follow up AHF 03/08/23 Will arrange 1 week labs 03/01/23 @9  Pt aware of meds changes via husband

## 2023-02-21 NOTE — Progress Notes (Signed)
Spoke with patient and advised Dr Shirlee Latch increased Torsemide to 40 mg twice a day, BMET in a week (7/17) and she has OV scheduled 7/24.  She verbalized understanding and will call back for any questions or changes.

## 2023-02-23 ENCOUNTER — Other Ambulatory Visit (HOSPITAL_COMMUNITY): Payer: Self-pay

## 2023-02-27 ENCOUNTER — Ambulatory Visit: Payer: Medicare Other | Attending: Internal Medicine

## 2023-02-27 DIAGNOSIS — I5022 Chronic systolic (congestive) heart failure: Secondary | ICD-10-CM

## 2023-02-27 DIAGNOSIS — Z950 Presence of cardiac pacemaker: Secondary | ICD-10-CM

## 2023-03-01 ENCOUNTER — Ambulatory Visit (HOSPITAL_COMMUNITY)
Admission: RE | Admit: 2023-03-01 | Discharge: 2023-03-01 | Disposition: A | Discharge status: Home / Self Care | Payer: Medicare Other | Source: Ambulatory Visit | Attending: Cardiology | Admitting: Cardiology

## 2023-03-01 DIAGNOSIS — I5022 Chronic systolic (congestive) heart failure: Secondary | ICD-10-CM | POA: Diagnosis not present

## 2023-03-01 LAB — BASIC METABOLIC PANEL
Anion gap: 9 (ref 5–15)
BUN: 24 mg/dL — ABNORMAL HIGH (ref 8–23)
CO2: 29 mmol/L (ref 22–32)
Calcium: 9 mg/dL (ref 8.9–10.3)
Chloride: 101 mmol/L (ref 98–111)
Creatinine, Ser: 1.68 mg/dL — ABNORMAL HIGH (ref 0.44–1.00)
GFR, Estimated: 33 mL/min — ABNORMAL LOW (ref >60)
Glucose, Bld: 122 mg/dL — ABNORMAL HIGH (ref 70–99)
Potassium: 3.7 mmol/L (ref 3.5–5.1)
Sodium: 139 mmol/L (ref 135–145)

## 2023-03-01 MED ORDER — TORSEMIDE 20 MG PO TABS: 40.0000 mg | ORAL_TABLET | Freq: Two times a day (BID) | ORAL | 3 refills | Status: DC

## 2023-03-01 MED ORDER — ENTRESTO 49-51 MG PO TABS: 1.0000 | ORAL_TABLET | Freq: Two times a day (BID) | ORAL | 3 refills | Status: DC

## 2023-03-01 NOTE — Progress Notes (Signed)
EPIC Encounter for ICM Monitoring  Patient Name: Melinda Mckinney is a 67 y.o. female Date: 03/01/2023 Primary Care Physican: Wanda Plump, MD Primary Cardiologist: Shirlee Latch Electrophysiologist: Rae Roam Pacing: 99.4%         02/08/2022 Weight: 214 lbs 10/05/2022 Weight: 209 lbs 01/16/2023 Weight: 215 lbs    Time in AT/AF  24.0 hr/day (100.0%)             Spoke with husband to interpret and heart failure questions reviewed.  Transmission results reviewed.  Pt asymptomatic for fluid accumulation.  Pt had labs drawn this morning and doing fine with increased Torsemide dosage.    Diet:  Does not follow low salt diet.     Optivol thoracic impedance suggesting fluid levels returned to normal after Torsemide dosage was increased   Difficulty maintaining normal fluid levels in the past year.   Prescribed:  Torsemide 20 mg take 2 tablets (40 mg total) by mouth twice a day.  Spironolactone 25 mg take 1 tablet daily Farxiga 10 mg take 1 tablet daily   Labs: 03/01/2023 Creatinine 1.68, BUN 24, Potassium 3.7, Sodium 139, GFR 33 01/24/2023 Creatinine 1.54, BUN 22, Potassium 3.6, Sodium 139, GFR 36 02/09/2022 Creatinine 1.87, BUN 49, Potassium 4.6, Sodium 139, GFR 29 11/04/2021 Creatinine 1.5,   BUN 27, Potassium 5.2, Sodium 142 10/26/2021 Creatinine 1.62, BUN 31, Potassium 4.7, Sodium 140  A complete set of results can be found in Results Review.   Recommendations:  No changes and encouraged to call if experiencing any fluid symptoms.   Follow-up plan: ICM clinic phone appointment on 03/27/2023.   91 day device clinic remote transmission 04/03/2023.     EP/Cardiology Office Visits:  03/08/2023 with Dr Shirlee Latch.    Recall 06/08/2023 with Dr Nelly Laurence.       Copy of ICM check sent to Dr. Ladona Ridgel.     3 month ICM trend: 02/27/2023.    12-14 Month ICM trend:     Karie Soda, RN 03/01/2023 10:16 AM

## 2023-03-08 ENCOUNTER — Other Ambulatory Visit (HOSPITAL_COMMUNITY): Payer: Self-pay

## 2023-03-08 ENCOUNTER — Encounter (HOSPITAL_COMMUNITY): Payer: Self-pay | Admitting: Cardiology

## 2023-03-08 ENCOUNTER — Ambulatory Visit (HOSPITAL_COMMUNITY)
Admission: RE | Admit: 2023-03-08 | Discharge: 2023-03-08 | Disposition: A | Discharge status: Home / Self Care | Payer: Medicare Other | Source: Ambulatory Visit | Attending: Cardiology | Admitting: Cardiology

## 2023-03-08 VITALS — BP 88/50 | HR 73 | Wt 217.8 lb

## 2023-03-08 DIAGNOSIS — N183 Chronic kidney disease, stage 3 unspecified: Secondary | ICD-10-CM | POA: Insufficient documentation

## 2023-03-08 DIAGNOSIS — Z7901 Long term (current) use of anticoagulants: Secondary | ICD-10-CM | POA: Diagnosis not present

## 2023-03-08 DIAGNOSIS — I130 Hypertensive heart and chronic kidney disease with heart failure and stage 1 through stage 4 chronic kidney disease, or unspecified chronic kidney disease: Secondary | ICD-10-CM | POA: Insufficient documentation

## 2023-03-08 DIAGNOSIS — I5022 Chronic systolic (congestive) heart failure: Secondary | ICD-10-CM | POA: Diagnosis not present

## 2023-03-08 DIAGNOSIS — Z8616 Personal history of COVID-19: Secondary | ICD-10-CM | POA: Diagnosis not present

## 2023-03-08 DIAGNOSIS — I340 Nonrheumatic mitral (valve) insufficiency: Secondary | ICD-10-CM | POA: Diagnosis not present

## 2023-03-08 DIAGNOSIS — Z79899 Other long term (current) drug therapy: Secondary | ICD-10-CM | POA: Insufficient documentation

## 2023-03-08 DIAGNOSIS — I428 Other cardiomyopathies: Secondary | ICD-10-CM | POA: Diagnosis not present

## 2023-03-08 DIAGNOSIS — I959 Hypotension, unspecified: Secondary | ICD-10-CM | POA: Insufficient documentation

## 2023-03-08 DIAGNOSIS — I4819 Other persistent atrial fibrillation: Secondary | ICD-10-CM | POA: Insufficient documentation

## 2023-03-08 MED ORDER — SPIRONOLACTONE 25 MG PO TABS: 12.5000 mg | ORAL_TABLET | Freq: Every evening | ORAL | 3 refills | Status: DC

## 2023-03-08 MED ORDER — ENTRESTO 24-26 MG PO TABS: 1.0000 | ORAL_TABLET | Freq: Two times a day (BID) | ORAL | 6 refills | Status: DC

## 2023-03-08 MED ORDER — RIVAROXABAN 15 MG PO TABS: 15.0000 mg | ORAL_TABLET | Freq: Every day | ORAL | 3 refills | Status: DC

## 2023-03-08 NOTE — Progress Notes (Signed)
PCP: Wanda Plump, MD  Nephrology: Dr. Thedore Mins HF Cardiology: Dr. Shirlee Latch  67 y.o. with CAD s/p PCI in 2010, chronic combined systolic and diastolic HF (EF 16-10% in 2018), atrial fibrillation on chronic anticoagulation w/ Xarelto, HTN, and hypothyroidism was diagnosed w/ COVID-19 in Sep 2020 and treated at [facility]. COVID infection c/b PNA. She was discharged and readmitted back to [facility] 10/9-10/13/20 for gastroenteritis, felt to be a sequela of COVID-19. She was managed w/ supportive care and discharged home. Had f/u with PCP 10/22 and endorsed worsening LLQ pain leading to abdominal CT which showed acute diverticulitis. She was started on outpatient antibiotics, cipro + flagyl but symptoms failed to improve, prompting her to seek emergency medical care at [facility].  Initial EKG in the ED showed afib w/ RVR for which general cardiology was consulted. She was started on IV dilt for rate control but later required treatment w/ IV amiodarone due to persistent rapid ventricular response. Hospital course c/b volume overload, requiring IV diuretics. 2D echo was obtained and showed worsening LVEF, now 25-30% w/ moderate MR. She underwent subsequent R/LHC in 11/20 which demonstrated widely patent coronaries. RHC showed elevated mean RA pressure at 22, PWP 43, LVEDP 45 and low CO. CI by Fick 1.6. She underwent TEE-guided DCCV, TEE showed EF 15% with moderate RV dysfunction and moderate MR.  She went back into rapid atrial fibrillation.  She failed additional cardioversions.  Despite amiodarone gtt, HR remained in the 140s-150s.  Finally, she underwent AV nodal ablation with BiV pacing due to inability to control atrial fibrillation. While in the hospital, she was on milrinone gtt for a period of time, but we were able to taper it off. She was discharged home after medication optimization.   Echo in 2/21 showed EF up to 40-45%, mild diffuse hypokinesis, RV low normal function.  Echo in 3/22 showed EF  55-60%, RV normal, severe LAE.    Today she returns for HF follow up with her husband and interpretor. She has avascular necrosis of the right hip and is planned for THR in 8/24.  She is limited by hip pain but denies exertional dyspnea or chest pain.  BP is low today but she denies lightheadedness. She says that her SBP is usually in the 90s.  She is not currently on spironolactone. Weight up 4 lbs.   ECG (personally reviewed): Atrial fibrillation with BiV pacing  Labs (11/20): digoxin level 1.4, creatinine 1.81, K 4.8, hgb 13.9 Labs (1/21): LDL 91, K 3.7, creatinine 9.60 Labs (2/21): K 5.1, creatinine 1.37, TSH normal, digoxin 0.4 Labs (5/21): LDL 68, digoxin 0.3 Labs (4/54): K 4.4, creatinine 1.21 Labs (1/22): K 4, creatinine 1.65, TSH normal Labs (7/22): LDL 78, K 4.3, creatinine 0.98 Labs (11/22): K 4.8, creatinine 2.4 Labs (3/23): hgb 11.1 Labs (6/23): K 4.6, creatinine 1.87 Labs (2/24): LDL 59 Labs (7/24): K 3.7, creatinine 1.68  PMH: 1. Hypothyroidism 2. Single kidney s/p nephrectomy 3. Atrial fibrillation: Initially diagnosed in 2018.  She was on sotalol at one point to maintain NSR.  - Uncontrollable atrial fibrillation during 11/20 admission, patient eventually had AV nodal ablation with CRT-P implantation.  4. H/o achalasia 5. GERD 6. Fibromyalgia 7. H/o diverticulitis in 10/20 8. COVID-19 in 9/20.  9. H/o cervical cancer: cryotherapy.  10. Degenerative disc disease.  11. Chronic systolic CHF: Nonischemic cardiomyopathy, possibly tachycardia-mediated in setting of uncontrolled atrial fibrillation. Medtronic CRT-P device.  - Echo (2018): EF 45-50%.  - Echo (11/20): EF 25-30%, mildly  decreased RV systolic function, moderate MR.  - TEE (11/20): EF 15%, moderately decreased RV systolic function, moderate MR.  - Cardiac MRI (11/20): EF 34%, RV EF 30%, no LGE (done after AV nodal ablation and BiV pacing).  - RHC (11/20): mean RA 22, mean PCWP 43, LVEDP 45, CI 1.6 (Fick) and  2.7 (thermodilution).  - Echo (2/21): EF 40-45%, mild diffuse hypokinesis, low normal RV systolic function.  - Echo (5/22): EF 55-60%, RV normal, severe LAE 12. CAD: PCI in 2010.  - LHC (11/20): No significant coronary disease.  13. Avascular necrosis left hip  Social History   Socioeconomic History   Marital status: Married    Spouse name: Not on file   Number of children: 3   Years of education: Not on file   Highest education level: Not on file  Occupational History   Occupation: stay home   Tobacco Use   Smoking status: Former    Current packs/day: 0.00    Average packs/day: 2.0 packs/day for 28.0 years (56.0 ttl pk-yrs)    Types: Cigarettes    Start date: 31    Quit date: 2000    Years since quitting: 24.5   Smokeless tobacco: Never  Vaping Use   Vaping status: Never Used  Substance and Sexual Activity   Alcohol use: Yes    Alcohol/week: 5.0 standard drinks of alcohol    Types: 5 Cans of beer per week    Comment: 07/27/2017 "3-4 drinks//month"   Drug use: No   Sexual activity: Not Currently    Comment: 1ST INTERCOURSE- 16, PARTNERS - 2  Other Topics Concern   Not on file  Social History Narrative   Original from [place]   3 children, 2 alive   Household --pt and husband    Social Determinants of Health   Financial Resource Strain: Low Risk  (11/29/2021)   Overall Financial Resource Strain (CARDIA)    Difficulty of Paying Living Expenses: Not hard at all  Food Insecurity: No Food Insecurity (11/29/2021)   Hunger Vital Sign    Worried About Running Out of Food in the Last Year: Never true    Ran Out of Food in the Last Year: Never true  Transportation Needs: Unknown (11/29/2021)   PRAPARE - Administrator, Civil Service (Medical): No    Lack of Transportation (Non-Medical): Not on file  Physical Activity: Inactive (11/29/2021)   Exercise Vital Sign    Days of Exercise per Week: 0 days    Minutes of Exercise per Session: 0 min  Stress: No  Stress Concern Present (11/29/2021)   [employer] of Occupational Health - Occupational Stress Questionnaire    Feeling of Stress : Not at all  Social Connections: Moderately Integrated (11/29/2021)   Social Connection and Isolation Panel [NHANES]    Frequency of Communication with Friends and Family: More than three times a week    Frequency of Social Gatherings with Friends and Family: More than three times a week    Attends Religious Services: Never    Database administrator or Organizations: Yes    Attends Engineer, structural: More than 4 times per year    Marital Status: Married  Catering manager Violence: Not At Risk (11/29/2021)   Humiliation, Afraid, Rape, and Kick questionnaire    Fear of Current or Ex-Partner: No    Emotionally Abused: No    Physically Abused: No    Sexually Abused: No   Family History  Problem  Relation Age of Onset   Breast cancer Other        aunt    CAD Brother    Lung cancer Mother        F and M   Diabetes Father        F and mother    CAD Father    Lung cancer Father    Stroke Sister    Colon cancer Neg Hx    ROS: All systems reviewed and negative except as per HPI.   Current Outpatient Medications  Medication Sig Dispense Refill   alendronate (FOSAMAX) 70 MG tablet TAKE 1 TABLET BY MOUTH ONCE WEEKLY ON AN EMPTY STOMACH BEFORE BREAKFAST. REMAIN UPRIGHT FOR 30 MINUTES AND TAKE WITH 8 OUNCES OF WATER 12 tablet 1   allopurinol (ZYLOPRIM) 100 MG tablet Take 1 tablet (100 mg total) by mouth daily. 90 tablet 1   atorvastatin (LIPITOR) 40 MG tablet Take 1.5 tablets (60 mg total) by mouth at bedtime. 135 tablet 1   carvedilol (COREG) 6.25 MG tablet TAKE ONE TABLET BY MOUTH TWICE A DAY WITH A MEAL 180 tablet 3   Cholecalciferol (VITAMIN D3) 5000 UNITS CAPS Take 5,000 Units by mouth daily.      clobetasol cream (TEMOVATE) 0.05 % Apply topically 2 (two) times daily as needed. 60 g 1   cyclobenzaprine (FLEXERIL) 10 MG tablet Take 1 tablet  (10 mg total) by mouth 2 (two) times daily as needed for muscle spasms. 60 tablet 1   DULoxetine (CYMBALTA) 60 MG capsule TAKE 1 CAPSULE BY MOUTH EVERY DAY 90 capsule 1   FARXIGA 10 MG TABS tablet Take 1 tablet (10 mg total) by mouth daily before breakfast. 30 tablet 11   ferrous fumarate (HEMOCYTE - 106 MG FE) 325 (106 Fe) MG TABS tablet Take 1 tablet (106 mg of iron total) by mouth 2 (two) times daily. Do not take at the same time as omeprazole (Prilosec) 60 tablet 6   ketoconazole (NIZORAL) 2 % cream Apply 1 application topically daily. 60 g 0   levothyroxine (SYNTHROID) 112 MCG tablet Take 1 tablet (112 mcg total) by mouth daily before breakfast. 90 tablet 1   metFORMIN (GLUCOPHAGE) 500 MG tablet Take 1 tablet (500 mg total) by mouth 2 (two) times daily with a meal. 180 tablet 0   omeprazole (PRILOSEC) 20 MG capsule Take 1 capsule (20 mg total) by mouth 2 (two) times daily before a meal. 180 capsule 1   sacubitril-valsartan (ENTRESTO) 24-26 MG Take 1 tablet by mouth 2 (two) times daily. 60 tablet 6   sacubitril-valsartan (ENTRESTO) 49-51 MG Take 1 tablet by mouth 2 (two) times daily. 60 tablet 3   spironolactone (ALDACTONE) 25 MG tablet Take 0.5 tablets (12.5 mg total) by mouth at bedtime. 90 tablet 3   torsemide (DEMADEX) 20 MG tablet Take 2 tablets (40 mg total) by mouth 2 (two) times daily. 120 tablet 3   triamcinolone (KENALOG) 0.025 % ointment Apply topically as needed.     Rivaroxaban (XARELTO) 15 MG TABS tablet Take 1 tablet (15 mg total) by mouth daily with supper. 90 tablet 3   spironolactone (ALDACTONE) 25 MG tablet Take 0.5 tablets (12.5 mg total) by mouth daily. (Patient not taking: Reported on 03/08/2023) 90 tablet 3   No current facility-administered medications for this encounter.   Wt Readings from Last 3 Encounters:  03/08/23 98.8 kg (217 lb 12.8 oz)  10/19/22 98.6 kg (217 lb 6.4 oz)  10/11/22 98.7 kg (217 lb 8  oz)   BP (!) 88/50   Pulse 73   Wt 98.8 kg (217 lb 12.8 oz)    SpO2 98%   BMI 36.24 kg/m  General: NAD Neck: No JVD, no thyromegaly or thyroid nodule.  Lungs: Clear to auscultation bilaterally with normal respiratory effort. CV: Nondisplaced PMI.  Heart regular S1/S2, no S3/S4, no murmur.  No peripheral edema.  No carotid bruit.  Normal pedal pulses.  Abdomen: Soft, nontender, no hepatosplenomegaly, no distention.  Skin: Intact without lesions or rashes.  Neurologic: Alert and oriented x 3.  Psych: Normal affect. Extremities: No clubbing or cyanosis.  HEENT: Normal.   Assessment/Plan: 1. Chronic systolic CHF:  Nonischemic cardiomyopathy.  Echo in 2018 with EF 45-50%.  Echo in 11/20 with EF 25-30%, diffuse hypokinesis, mildly decreased RV systolic function, moderate MR. LHC/RHC in 11/20 with no significant coronary disease, markedly elevated filling pressures and low cardiac output (CI 1.6).  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 Cardiac MRI 11/20 showed LVEF 34%, RVEF 30%, no LGE => perhaps pointing to tachy-mediated CMP as more likely etiology.  Initially required inotropic support w/ milrinone but able to titrate off.  Now s/p AV nodal ablation with MDT CRT-P.  Echo in 2/21 showed EF up to 40-45%, echo in 3/22 with EF up to 55-60%.  NYHA class I-II, not volume overloaded. BP is low.  - Continue torsemide 40 mg bid.   - Decrease Entresto to 24/26 bid with hypotension.   - Restart spironolactone at 12.5 mg daily but take at bedtime.  BMET/BNP in 10 days.  - Continue dapagliflozin 10 mg daily.  - Continue Coreg 6.25 mg bid.   - I will arrange for repeat echo prior to surgery.   2. Atrial fibrillation: Persistent atrial fibrillation, it appears, at least since 9/20 when she was admitted with COVID-19. Prior, she had paroxysmal atrial fibrillation and was maintained on sotalol. She is now off sotalol.  Unable to cardiovert or rate control  atrial fibrillation. Therefore, she had AV nodal ablation with BiV pacing.  - Continue Xarelto 15 mg daily.  3. Mitral regurgitation: Functional MR, moderate on TEE.  Rate was controlled on cardiac MRI, MR was less impressive.  Minimal MR on subsequent echoes.  4. CKD stage 3: She is on [place].  5. Right hip avascular necrosis: She is scheduled for THR in [place] in 8/24.   - Echo prior to surgery.  - Hold Xarelto 3 days prior to surgery, restart afterwards.   Follow up in 4 months with APP  Marca Ancona  03/08/2023

## 2023-03-08 NOTE — Patient Instructions (Signed)
RESTART Spironolactone 12.5 mg ( 1/2 tablet) nightly  DECREASE Entresto to 24/26 mg tablet Twice daily  Your physician has requested that you have an echocardiogram. Echocardiography is a painless test that uses sound waves to create images of your heart. It provides your doctor with information about the size and shape of your heart and how well your heart's chambers and valves are working. This procedure takes approximately one hour. There are no restrictions for this procedure. Please do NOT wear cologne, perfume, aftershave, or lotions (deodorant is allowed). Please arrive 15 minutes prior to your appointment time  Lab work in 10 days   Your physician recommends that you schedule a follow-up appointment in: as scheduled  If you have any questions or concerns before your next appointment please send [place] a message through [place] or call our office at [phone].    TO LEAVE A MESSAGE FOR THE NURSE SELECT OPTION 2, PLEASE LEAVE A MESSAGE INCLUDING: YOUR NAME DATE OF BIRTH CALL BACK NUMBER REASON FOR CALL**this is important as we prioritize the call backs  YOU WILL RECEIVE A CALL BACK THE SAME DAY AS LONG AS YOU CALL BEFORE 4:00 PM  At the [facility], you and your health needs are our priority. As part of our continuing mission to provide you with exceptional heart care, we have created designated Provider Care Teams. These Care Teams include your primary Cardiologist (physician) and Advanced Practice Providers (APPs- Physician Assistants and Nurse Practitioners) who all work together to provide you with the care you need, when you need it.   You may see any of the following providers on your designated Care Team at your next follow up: Dr Arvilla Meres Dr Marca Ancona Dr. Marcos Eke, NP Robbie Lis, [place] [facility] [place], [place] Brynda Peon, NP Karle Plumber, [name]   Please be sure to bring in all your medications bottles to  every appointment.    Thank you for choosing [facility]

## 2023-03-09 DIAGNOSIS — E119 Type 2 diabetes mellitus without complications: Secondary | ICD-10-CM | POA: Diagnosis not present

## 2023-03-09 DIAGNOSIS — D23111 Other benign neoplasm of skin of right upper eyelid, including canthus: Secondary | ICD-10-CM | POA: Diagnosis not present

## 2023-03-09 DIAGNOSIS — D3131 Benign neoplasm of right choroid: Secondary | ICD-10-CM | POA: Diagnosis not present

## 2023-03-09 DIAGNOSIS — H52221 Regular astigmatism, right eye: Secondary | ICD-10-CM | POA: Diagnosis not present

## 2023-03-09 DIAGNOSIS — H524 Presbyopia: Secondary | ICD-10-CM | POA: Diagnosis not present

## 2023-03-09 DIAGNOSIS — H2513 Age-related nuclear cataract, bilateral: Secondary | ICD-10-CM | POA: Diagnosis not present

## 2023-03-09 DIAGNOSIS — H5202 Hypermetropia, left eye: Secondary | ICD-10-CM | POA: Diagnosis not present

## 2023-03-15 DIAGNOSIS — Z471 Aftercare following joint replacement surgery: Secondary | ICD-10-CM | POA: Diagnosis not present

## 2023-03-15 DIAGNOSIS — M87051 Idiopathic aseptic necrosis of right femur: Secondary | ICD-10-CM | POA: Diagnosis not present

## 2023-03-16 DIAGNOSIS — N1832 Chronic kidney disease, stage 3b: Secondary | ICD-10-CM | POA: Diagnosis not present

## 2023-03-16 DIAGNOSIS — I129 Hypertensive chronic kidney disease with stage 1 through stage 4 chronic kidney disease, or unspecified chronic kidney disease: Secondary | ICD-10-CM | POA: Diagnosis not present

## 2023-03-16 DIAGNOSIS — I5020 Unspecified systolic (congestive) heart failure: Secondary | ICD-10-CM | POA: Diagnosis not present

## 2023-03-16 DIAGNOSIS — D631 Anemia in chronic kidney disease: Secondary | ICD-10-CM | POA: Diagnosis not present

## 2023-03-20 ENCOUNTER — Ambulatory Visit (HOSPITAL_COMMUNITY)
Admission: RE | Admit: 2023-03-20 | Discharge: 2023-03-20 | Disposition: A | Discharge status: Home / Self Care | Payer: Medicare Other | Source: Ambulatory Visit | Attending: Cardiology | Admitting: Cardiology

## 2023-03-20 ENCOUNTER — Ambulatory Visit (HOSPITAL_COMMUNITY)
Admission: RE | Admit: 2023-03-20 | Discharge: 2023-03-20 | Disposition: A | Discharge status: Home / Self Care | Payer: Medicare Other | Source: Ambulatory Visit

## 2023-03-20 DIAGNOSIS — E119 Type 2 diabetes mellitus without complications: Secondary | ICD-10-CM | POA: Diagnosis not present

## 2023-03-20 DIAGNOSIS — I110 Hypertensive heart disease with heart failure: Secondary | ICD-10-CM | POA: Insufficient documentation

## 2023-03-20 DIAGNOSIS — E785 Hyperlipidemia, unspecified: Secondary | ICD-10-CM | POA: Insufficient documentation

## 2023-03-20 DIAGNOSIS — Z95810 Presence of automatic (implantable) cardiac defibrillator: Secondary | ICD-10-CM | POA: Diagnosis not present

## 2023-03-20 DIAGNOSIS — I351 Nonrheumatic aortic (valve) insufficiency: Secondary | ICD-10-CM | POA: Diagnosis not present

## 2023-03-20 DIAGNOSIS — I5022 Chronic systolic (congestive) heart failure: Secondary | ICD-10-CM | POA: Diagnosis not present

## 2023-03-20 LAB — ECHOCARDIOGRAM COMPLETE
AR max vel: 2.05 cm2
AV Area VTI: 2.07 cm2
AV Area mean vel: 2.18 cm2
AV Mean grad: 9 mmHg
AV Peak grad: 17.8 mmHg
Ao pk vel: 2.11 m/s
Area-P 1/2: 3.03 cm2
P 1/2 time: 664 msec
S' Lateral: 2.6 cm

## 2023-03-20 LAB — BASIC METABOLIC PANEL
Anion gap: 13 (ref 5–15)
BUN: 33 mg/dL — ABNORMAL HIGH (ref 8–23)
CO2: 23 mmol/L (ref 22–32)
Calcium: 9.9 mg/dL (ref 8.9–10.3)
Chloride: 101 mmol/L (ref 98–111)
Creatinine, Ser: 1.93 mg/dL — ABNORMAL HIGH (ref 0.44–1.00)
GFR, Estimated: 28 mL/min — ABNORMAL LOW (ref >60)
Glucose, Bld: 174 mg/dL — ABNORMAL HIGH (ref 70–99)
Potassium: 3.2 mmol/L — ABNORMAL LOW (ref 3.5–5.1)
Sodium: 137 mmol/L (ref 135–145)

## 2023-03-21 ENCOUNTER — Telehealth (HOSPITAL_COMMUNITY): Payer: Self-pay

## 2023-03-21 DIAGNOSIS — I5022 Chronic systolic (congestive) heart failure: Secondary | ICD-10-CM

## 2023-03-21 MED ORDER — TORSEMIDE 20 MG PO TABS
ORAL_TABLET | ORAL | 11 refills | Status: DC
Start: 2023-03-21 — End: 2023-08-30

## 2023-03-21 MED ORDER — POTASSIUM CHLORIDE CRYS ER 20 MEQ PO TBCR
20.0000 meq | EXTENDED_RELEASE_TABLET | Freq: Every day | ORAL | 11 refills | Status: AC
Start: 2023-03-21 — End: ?

## 2023-03-21 NOTE — Telephone Encounter (Signed)
-----   Message from Marca Ancona sent at 03/20/2023  8:24 PM EDT ----- Start KCl 20 daily.  Decrease torsemide to 40 qam/20 qpm.  BMET 1 week.

## 2023-03-21 NOTE — Telephone Encounter (Signed)
Patient's meds has been sent to pharmacy and med list changed and updated. In addition, lab order placed and her appointment scheduled. Pt's husband aware, agreeable, and verbalized understanding.

## 2023-03-22 ENCOUNTER — Other Ambulatory Visit: Payer: Self-pay | Admitting: Internal Medicine

## 2023-03-27 ENCOUNTER — Ambulatory Visit: Payer: Medicare Other | Attending: Internal Medicine

## 2023-03-27 DIAGNOSIS — I5022 Chronic systolic (congestive) heart failure: Secondary | ICD-10-CM | POA: Diagnosis not present

## 2023-03-27 DIAGNOSIS — Z950 Presence of cardiac pacemaker: Secondary | ICD-10-CM

## 2023-03-28 ENCOUNTER — Ambulatory Visit (HOSPITAL_COMMUNITY)
Admission: RE | Admit: 2023-03-28 | Discharge: 2023-03-28 | Disposition: A | Discharge status: Home / Self Care | Payer: Medicare Other | Source: Ambulatory Visit | Attending: Cardiology | Admitting: Cardiology

## 2023-03-28 DIAGNOSIS — E119 Type 2 diabetes mellitus without complications: Secondary | ICD-10-CM | POA: Diagnosis not present

## 2023-03-28 DIAGNOSIS — I110 Hypertensive heart disease with heart failure: Secondary | ICD-10-CM | POA: Diagnosis not present

## 2023-03-28 DIAGNOSIS — I25119 Atherosclerotic heart disease of native coronary artery with unspecified angina pectoris: Secondary | ICD-10-CM | POA: Diagnosis not present

## 2023-03-28 DIAGNOSIS — M87051 Idiopathic aseptic necrosis of right femur: Secondary | ICD-10-CM | POA: Diagnosis not present

## 2023-03-28 DIAGNOSIS — I5022 Chronic systolic (congestive) heart failure: Secondary | ICD-10-CM | POA: Diagnosis not present

## 2023-03-28 DIAGNOSIS — I10 Essential (primary) hypertension: Secondary | ICD-10-CM | POA: Diagnosis not present

## 2023-03-28 DIAGNOSIS — I5020 Unspecified systolic (congestive) heart failure: Secondary | ICD-10-CM | POA: Diagnosis not present

## 2023-03-28 LAB — BASIC METABOLIC PANEL
Anion gap: 11 (ref 5–15)
BUN: 36 mg/dL — ABNORMAL HIGH (ref 8–23)
CO2: 28 mmol/L (ref 22–32)
Calcium: 10 mg/dL (ref 8.9–10.3)
Chloride: 97 mmol/L — ABNORMAL LOW (ref 98–111)
Creatinine, Ser: 1.67 mg/dL — ABNORMAL HIGH (ref 0.44–1.00)
GFR, Estimated: 33 mL/min — ABNORMAL LOW (ref >60)
Glucose, Bld: 112 mg/dL — ABNORMAL HIGH (ref 70–99)
Potassium: 4.5 mmol/L (ref 3.5–5.1)
Sodium: 136 mmol/L (ref 135–145)

## 2023-03-30 DIAGNOSIS — Z79899 Other long term (current) drug therapy: Secondary | ICD-10-CM | POA: Diagnosis not present

## 2023-03-30 DIAGNOSIS — M87051 Idiopathic aseptic necrosis of right femur: Secondary | ICD-10-CM | POA: Diagnosis not present

## 2023-03-30 DIAGNOSIS — M810 Age-related osteoporosis without current pathological fracture: Secondary | ICD-10-CM | POA: Diagnosis not present

## 2023-03-30 DIAGNOSIS — I498 Other specified cardiac arrhythmias: Secondary | ICD-10-CM | POA: Diagnosis not present

## 2023-03-30 DIAGNOSIS — Z880 Allergy status to penicillin: Secondary | ICD-10-CM | POA: Diagnosis not present

## 2023-03-30 DIAGNOSIS — I4891 Unspecified atrial fibrillation: Secondary | ICD-10-CM | POA: Diagnosis not present

## 2023-03-30 DIAGNOSIS — Z7984 Long term (current) use of oral hypoglycemic drugs: Secondary | ICD-10-CM | POA: Diagnosis not present

## 2023-03-30 DIAGNOSIS — Z96641 Presence of right artificial hip joint: Secondary | ICD-10-CM | POA: Diagnosis not present

## 2023-03-30 DIAGNOSIS — I509 Heart failure, unspecified: Secondary | ICD-10-CM | POA: Diagnosis not present

## 2023-03-30 DIAGNOSIS — J45909 Unspecified asthma, uncomplicated: Secondary | ICD-10-CM | POA: Diagnosis not present

## 2023-03-30 DIAGNOSIS — M87851 Other osteonecrosis, right femur: Secondary | ICD-10-CM | POA: Diagnosis not present

## 2023-03-30 DIAGNOSIS — G8918 Other acute postprocedural pain: Secondary | ICD-10-CM | POA: Diagnosis not present

## 2023-03-31 DIAGNOSIS — M810 Age-related osteoporosis without current pathological fracture: Secondary | ICD-10-CM | POA: Diagnosis not present

## 2023-03-31 DIAGNOSIS — I4891 Unspecified atrial fibrillation: Secondary | ICD-10-CM | POA: Diagnosis not present

## 2023-03-31 DIAGNOSIS — J45909 Unspecified asthma, uncomplicated: Secondary | ICD-10-CM | POA: Diagnosis not present

## 2023-03-31 DIAGNOSIS — Z79899 Other long term (current) drug therapy: Secondary | ICD-10-CM | POA: Diagnosis not present

## 2023-03-31 DIAGNOSIS — E119 Type 2 diabetes mellitus without complications: Secondary | ICD-10-CM | POA: Diagnosis not present

## 2023-03-31 DIAGNOSIS — K219 Gastro-esophageal reflux disease without esophagitis: Secondary | ICD-10-CM | POA: Diagnosis not present

## 2023-03-31 DIAGNOSIS — M87051 Idiopathic aseptic necrosis of right femur: Secondary | ICD-10-CM | POA: Diagnosis not present

## 2023-03-31 DIAGNOSIS — M87851 Other osteonecrosis, right femur: Secondary | ICD-10-CM | POA: Diagnosis not present

## 2023-03-31 DIAGNOSIS — Z7984 Long term (current) use of oral hypoglycemic drugs: Secondary | ICD-10-CM | POA: Diagnosis not present

## 2023-03-31 DIAGNOSIS — M25551 Pain in right hip: Secondary | ICD-10-CM | POA: Diagnosis not present

## 2023-03-31 DIAGNOSIS — Z471 Aftercare following joint replacement surgery: Secondary | ICD-10-CM | POA: Diagnosis not present

## 2023-03-31 DIAGNOSIS — I509 Heart failure, unspecified: Secondary | ICD-10-CM | POA: Diagnosis not present

## 2023-03-31 DIAGNOSIS — Z880 Allergy status to penicillin: Secondary | ICD-10-CM | POA: Diagnosis not present

## 2023-03-31 DIAGNOSIS — Z96641 Presence of right artificial hip joint: Secondary | ICD-10-CM | POA: Diagnosis not present

## 2023-03-31 DIAGNOSIS — I890 Lymphedema, not elsewhere classified: Secondary | ICD-10-CM | POA: Diagnosis not present

## 2023-03-31 NOTE — Progress Notes (Signed)
EPIC Encounter for ICM Monitoring  Patient Name: Melinda Mckinney is a 67 y.o. female Date: 03/31/2023 Primary Care Physican: Wanda Plump, MD Primary Cardiologist: Shirlee Latch Electrophysiologist: Rae Roam Pacing: 99.4%         02/08/2022 Weight: 214 lbs 10/05/2022 Weight: 209 lbs 01/16/2023 Weight: 215 lbs    Time in AT/AF  24.0 hr/day (100.0%)             Transmission reviewed.  Total Hip replacement 8/15   Diet:  Does not follow low salt diet.     Optivol thoracic impedance suggesting fluctuations between possible fluid accumulation and possible dryness since 7/18 ICM report.    Difficulty maintaining normal fluid levels in the past year.   Prescribed:  Torsemide 20 mg take 2 tablets (40 mg total) by mouth every morning and 1 tablet (20 mg total) every afternoon. Potassium 20 mEq take 1 tablet daily  Spironolactone 25 mg take 0.5 tablet (12.5 mg total) by mouth at bedrime Farxiga 10 mg take 1 tablet daily   Labs: Labs: 03/31/2023 Creatinine 1.94, GFR 28  03/28/2023 Creatinine 1.67, BUN 36, Potassium 4.5, Sodium 136, GFR 33  03/20/2023 Creatinine 1.93, BUN 33, Potassium 3.2, Sodium 137, GFR 28  03/01/2023 Creatinine 1.68, BUN 24, Potassium 3.7, Sodium 139, GFR 33 01/24/2023 Creatinine 1.54, BUN 22, Potassium 3.6, Sodium 139, GFR 36 02/09/2022 Creatinine 1.87, BUN 49, Potassium 4.6, Sodium 139, GFR 29 11/04/2021 Creatinine 1.5,   BUN 27, Potassium 5.2, Sodium 142 10/26/2021 Creatinine 1.62, BUN 31, Potassium 4.7, Sodium 140  A complete set of results can be found in Results Review.   Recommendations:  No changes.   Follow-up plan: ICM clinic phone appointment on 05/01/2023.   91 day device clinic remote transmission 04/03/2023.     EP/Cardiology Office Visits:  06/19/2023 with HF clinic.    Recall 06/08/2023 with Dr Nelly Laurence.       Copy of ICM check sent to Dr. Ladona Ridgel.     3 month ICM trend: 03/27/2023.    12-14 Month ICM trend:     Karie Soda, RN 03/31/2023 10:18 AM

## 2023-04-01 DIAGNOSIS — Z950 Presence of cardiac pacemaker: Secondary | ICD-10-CM | POA: Diagnosis not present

## 2023-04-01 DIAGNOSIS — I442 Atrioventricular block, complete: Secondary | ICD-10-CM | POA: Diagnosis not present

## 2023-04-01 DIAGNOSIS — K5790 Diverticulosis of intestine, part unspecified, without perforation or abscess without bleeding: Secondary | ICD-10-CM | POA: Diagnosis not present

## 2023-04-01 DIAGNOSIS — K219 Gastro-esophageal reflux disease without esophagitis: Secondary | ICD-10-CM | POA: Diagnosis not present

## 2023-04-01 DIAGNOSIS — M5030 Other cervical disc degeneration, unspecified cervical region: Secondary | ICD-10-CM | POA: Diagnosis not present

## 2023-04-01 DIAGNOSIS — M810 Age-related osteoporosis without current pathological fracture: Secondary | ICD-10-CM | POA: Diagnosis not present

## 2023-04-01 DIAGNOSIS — G4700 Insomnia, unspecified: Secondary | ICD-10-CM | POA: Diagnosis not present

## 2023-04-01 DIAGNOSIS — I25119 Atherosclerotic heart disease of native coronary artery with unspecified angina pectoris: Secondary | ICD-10-CM | POA: Diagnosis not present

## 2023-04-01 DIAGNOSIS — E119 Type 2 diabetes mellitus without complications: Secondary | ICD-10-CM | POA: Diagnosis not present

## 2023-04-01 DIAGNOSIS — I509 Heart failure, unspecified: Secondary | ICD-10-CM | POA: Diagnosis not present

## 2023-04-01 DIAGNOSIS — I110 Hypertensive heart disease with heart failure: Secondary | ICD-10-CM | POA: Diagnosis not present

## 2023-04-01 DIAGNOSIS — E785 Hyperlipidemia, unspecified: Secondary | ICD-10-CM | POA: Diagnosis not present

## 2023-04-01 DIAGNOSIS — Z96641 Presence of right artificial hip joint: Secondary | ICD-10-CM | POA: Diagnosis not present

## 2023-04-01 DIAGNOSIS — M5136 Other intervertebral disc degeneration, lumbar region: Secondary | ICD-10-CM | POA: Diagnosis not present

## 2023-04-01 DIAGNOSIS — Z8616 Personal history of COVID-19: Secondary | ICD-10-CM | POA: Diagnosis not present

## 2023-04-01 DIAGNOSIS — Z471 Aftercare following joint replacement surgery: Secondary | ICD-10-CM | POA: Diagnosis not present

## 2023-04-01 DIAGNOSIS — E559 Vitamin D deficiency, unspecified: Secondary | ICD-10-CM | POA: Diagnosis not present

## 2023-04-01 DIAGNOSIS — I4891 Unspecified atrial fibrillation: Secondary | ICD-10-CM | POA: Diagnosis not present

## 2023-04-01 DIAGNOSIS — I428 Other cardiomyopathies: Secondary | ICD-10-CM | POA: Diagnosis not present

## 2023-04-01 DIAGNOSIS — J45909 Unspecified asthma, uncomplicated: Secondary | ICD-10-CM | POA: Diagnosis not present

## 2023-04-01 DIAGNOSIS — M797 Fibromyalgia: Secondary | ICD-10-CM | POA: Diagnosis not present

## 2023-04-01 DIAGNOSIS — E039 Hypothyroidism, unspecified: Secondary | ICD-10-CM | POA: Diagnosis not present

## 2023-04-01 DIAGNOSIS — D509 Iron deficiency anemia, unspecified: Secondary | ICD-10-CM | POA: Diagnosis not present

## 2023-04-03 ENCOUNTER — Ambulatory Visit (INDEPENDENT_AMBULATORY_CARE_PROVIDER_SITE_OTHER): Payer: Medicare Other

## 2023-04-03 DIAGNOSIS — M810 Age-related osteoporosis without current pathological fracture: Secondary | ICD-10-CM | POA: Diagnosis not present

## 2023-04-03 DIAGNOSIS — D509 Iron deficiency anemia, unspecified: Secondary | ICD-10-CM | POA: Diagnosis not present

## 2023-04-03 DIAGNOSIS — E119 Type 2 diabetes mellitus without complications: Secondary | ICD-10-CM | POA: Diagnosis not present

## 2023-04-03 DIAGNOSIS — Z950 Presence of cardiac pacemaker: Secondary | ICD-10-CM | POA: Diagnosis not present

## 2023-04-03 DIAGNOSIS — Z471 Aftercare following joint replacement surgery: Secondary | ICD-10-CM | POA: Diagnosis not present

## 2023-04-03 DIAGNOSIS — E559 Vitamin D deficiency, unspecified: Secondary | ICD-10-CM | POA: Diagnosis not present

## 2023-04-03 DIAGNOSIS — K5790 Diverticulosis of intestine, part unspecified, without perforation or abscess without bleeding: Secondary | ICD-10-CM | POA: Diagnosis not present

## 2023-04-03 DIAGNOSIS — I25119 Atherosclerotic heart disease of native coronary artery with unspecified angina pectoris: Secondary | ICD-10-CM | POA: Diagnosis not present

## 2023-04-03 DIAGNOSIS — Z8616 Personal history of COVID-19: Secondary | ICD-10-CM | POA: Diagnosis not present

## 2023-04-03 DIAGNOSIS — G4700 Insomnia, unspecified: Secondary | ICD-10-CM | POA: Diagnosis not present

## 2023-04-03 DIAGNOSIS — M5030 Other cervical disc degeneration, unspecified cervical region: Secondary | ICD-10-CM | POA: Diagnosis not present

## 2023-04-03 DIAGNOSIS — I509 Heart failure, unspecified: Secondary | ICD-10-CM | POA: Diagnosis not present

## 2023-04-03 DIAGNOSIS — E039 Hypothyroidism, unspecified: Secondary | ICD-10-CM | POA: Diagnosis not present

## 2023-04-03 DIAGNOSIS — I428 Other cardiomyopathies: Secondary | ICD-10-CM | POA: Diagnosis not present

## 2023-04-03 DIAGNOSIS — I110 Hypertensive heart disease with heart failure: Secondary | ICD-10-CM | POA: Diagnosis not present

## 2023-04-03 DIAGNOSIS — Z96641 Presence of right artificial hip joint: Secondary | ICD-10-CM | POA: Diagnosis not present

## 2023-04-03 DIAGNOSIS — M5136 Other intervertebral disc degeneration, lumbar region: Secondary | ICD-10-CM | POA: Diagnosis not present

## 2023-04-03 DIAGNOSIS — I442 Atrioventricular block, complete: Secondary | ICD-10-CM | POA: Diagnosis not present

## 2023-04-03 DIAGNOSIS — K219 Gastro-esophageal reflux disease without esophagitis: Secondary | ICD-10-CM | POA: Diagnosis not present

## 2023-04-03 DIAGNOSIS — I4821 Permanent atrial fibrillation: Secondary | ICD-10-CM | POA: Diagnosis not present

## 2023-04-03 DIAGNOSIS — I4891 Unspecified atrial fibrillation: Secondary | ICD-10-CM | POA: Diagnosis not present

## 2023-04-03 DIAGNOSIS — E785 Hyperlipidemia, unspecified: Secondary | ICD-10-CM | POA: Diagnosis not present

## 2023-04-03 DIAGNOSIS — M797 Fibromyalgia: Secondary | ICD-10-CM | POA: Diagnosis not present

## 2023-04-03 DIAGNOSIS — J45909 Unspecified asthma, uncomplicated: Secondary | ICD-10-CM | POA: Diagnosis not present

## 2023-04-03 LAB — CUP PACEART REMOTE DEVICE CHECK
Battery Remaining Longevity: 60 mo
Battery Voltage: 2.95 V
Brady Statistic RA Percent Paced: 0 %
Brady Statistic RV Percent Paced: 99.56 %
Date Time Interrogation Session: 20240818201729
Implantable Lead Connection Status: 753985
Implantable Lead Connection Status: 753985
Implantable Lead Connection Status: 753985
Implantable Lead Implant Date: 20201109
Implantable Lead Implant Date: 20201109
Implantable Lead Implant Date: 20201109
Implantable Lead Location: 753858
Implantable Lead Location: 753859
Implantable Lead Location: 753860
Implantable Lead Model: 4598
Implantable Lead Model: 5076
Implantable Lead Model: 5076
Implantable Pulse Generator Implant Date: 20201109
Lead Channel Impedance Value: 1235 Ohm
Lead Channel Impedance Value: 1406 Ohm
Lead Channel Impedance Value: 1463 Ohm
Lead Channel Impedance Value: 361 Ohm
Lead Channel Impedance Value: 361 Ohm
Lead Channel Impedance Value: 399 Ohm
Lead Channel Impedance Value: 437 Ohm
Lead Channel Impedance Value: 513 Ohm
Lead Channel Impedance Value: 608 Ohm
Lead Channel Impedance Value: 646 Ohm
Lead Channel Impedance Value: 779 Ohm
Lead Channel Impedance Value: 912 Ohm
Lead Channel Impedance Value: 912 Ohm
Lead Channel Impedance Value: 950 Ohm
Lead Channel Pacing Threshold Amplitude: 0.625 V
Lead Channel Pacing Threshold Amplitude: 1.5 V
Lead Channel Pacing Threshold Pulse Width: 0.4 ms
Lead Channel Pacing Threshold Pulse Width: 0.8 ms
Lead Channel Sensing Intrinsic Amplitude: 0.875 mV
Lead Channel Sensing Intrinsic Amplitude: 0.875 mV
Lead Channel Sensing Intrinsic Amplitude: 9.125 mV
Lead Channel Sensing Intrinsic Amplitude: 9.125 mV
Lead Channel Setting Pacing Amplitude: 2 V
Lead Channel Setting Pacing Amplitude: 2.5 V
Lead Channel Setting Pacing Pulse Width: 0.4 ms
Lead Channel Setting Pacing Pulse Width: 0.8 ms
Lead Channel Setting Sensing Sensitivity: 2 mV
Zone Setting Status: 755011
Zone Setting Status: 755011

## 2023-04-04 ENCOUNTER — Other Ambulatory Visit: Payer: Self-pay | Admitting: Internal Medicine

## 2023-04-05 DIAGNOSIS — E039 Hypothyroidism, unspecified: Secondary | ICD-10-CM | POA: Diagnosis not present

## 2023-04-05 DIAGNOSIS — I110 Hypertensive heart disease with heart failure: Secondary | ICD-10-CM | POA: Diagnosis not present

## 2023-04-05 DIAGNOSIS — M810 Age-related osteoporosis without current pathological fracture: Secondary | ICD-10-CM | POA: Diagnosis not present

## 2023-04-05 DIAGNOSIS — M5136 Other intervertebral disc degeneration, lumbar region: Secondary | ICD-10-CM | POA: Diagnosis not present

## 2023-04-05 DIAGNOSIS — Z8616 Personal history of COVID-19: Secondary | ICD-10-CM | POA: Diagnosis not present

## 2023-04-05 DIAGNOSIS — I4891 Unspecified atrial fibrillation: Secondary | ICD-10-CM | POA: Diagnosis not present

## 2023-04-05 DIAGNOSIS — M797 Fibromyalgia: Secondary | ICD-10-CM | POA: Diagnosis not present

## 2023-04-05 DIAGNOSIS — E119 Type 2 diabetes mellitus without complications: Secondary | ICD-10-CM | POA: Diagnosis not present

## 2023-04-05 DIAGNOSIS — G4700 Insomnia, unspecified: Secondary | ICD-10-CM | POA: Diagnosis not present

## 2023-04-05 DIAGNOSIS — Z96641 Presence of right artificial hip joint: Secondary | ICD-10-CM | POA: Diagnosis not present

## 2023-04-05 DIAGNOSIS — I509 Heart failure, unspecified: Secondary | ICD-10-CM | POA: Diagnosis not present

## 2023-04-05 DIAGNOSIS — E785 Hyperlipidemia, unspecified: Secondary | ICD-10-CM | POA: Diagnosis not present

## 2023-04-05 DIAGNOSIS — J45909 Unspecified asthma, uncomplicated: Secondary | ICD-10-CM | POA: Diagnosis not present

## 2023-04-05 DIAGNOSIS — K5790 Diverticulosis of intestine, part unspecified, without perforation or abscess without bleeding: Secondary | ICD-10-CM | POA: Diagnosis not present

## 2023-04-05 DIAGNOSIS — K219 Gastro-esophageal reflux disease without esophagitis: Secondary | ICD-10-CM | POA: Diagnosis not present

## 2023-04-05 DIAGNOSIS — M5030 Other cervical disc degeneration, unspecified cervical region: Secondary | ICD-10-CM | POA: Diagnosis not present

## 2023-04-05 DIAGNOSIS — I428 Other cardiomyopathies: Secondary | ICD-10-CM | POA: Diagnosis not present

## 2023-04-05 DIAGNOSIS — I25119 Atherosclerotic heart disease of native coronary artery with unspecified angina pectoris: Secondary | ICD-10-CM | POA: Diagnosis not present

## 2023-04-05 DIAGNOSIS — Z471 Aftercare following joint replacement surgery: Secondary | ICD-10-CM | POA: Diagnosis not present

## 2023-04-05 DIAGNOSIS — D509 Iron deficiency anemia, unspecified: Secondary | ICD-10-CM | POA: Diagnosis not present

## 2023-04-05 DIAGNOSIS — Z950 Presence of cardiac pacemaker: Secondary | ICD-10-CM | POA: Diagnosis not present

## 2023-04-05 DIAGNOSIS — E559 Vitamin D deficiency, unspecified: Secondary | ICD-10-CM | POA: Diagnosis not present

## 2023-04-05 DIAGNOSIS — I442 Atrioventricular block, complete: Secondary | ICD-10-CM | POA: Diagnosis not present

## 2023-04-07 DIAGNOSIS — M5136 Other intervertebral disc degeneration, lumbar region: Secondary | ICD-10-CM | POA: Diagnosis not present

## 2023-04-07 DIAGNOSIS — E039 Hypothyroidism, unspecified: Secondary | ICD-10-CM | POA: Diagnosis not present

## 2023-04-07 DIAGNOSIS — I25119 Atherosclerotic heart disease of native coronary artery with unspecified angina pectoris: Secondary | ICD-10-CM | POA: Diagnosis not present

## 2023-04-07 DIAGNOSIS — Z471 Aftercare following joint replacement surgery: Secondary | ICD-10-CM | POA: Diagnosis not present

## 2023-04-07 DIAGNOSIS — I442 Atrioventricular block, complete: Secondary | ICD-10-CM | POA: Diagnosis not present

## 2023-04-07 DIAGNOSIS — E785 Hyperlipidemia, unspecified: Secondary | ICD-10-CM | POA: Diagnosis not present

## 2023-04-07 DIAGNOSIS — M810 Age-related osteoporosis without current pathological fracture: Secondary | ICD-10-CM | POA: Diagnosis not present

## 2023-04-07 DIAGNOSIS — D509 Iron deficiency anemia, unspecified: Secondary | ICD-10-CM | POA: Diagnosis not present

## 2023-04-07 DIAGNOSIS — Z96641 Presence of right artificial hip joint: Secondary | ICD-10-CM | POA: Diagnosis not present

## 2023-04-07 DIAGNOSIS — I428 Other cardiomyopathies: Secondary | ICD-10-CM | POA: Diagnosis not present

## 2023-04-07 DIAGNOSIS — K219 Gastro-esophageal reflux disease without esophagitis: Secondary | ICD-10-CM | POA: Diagnosis not present

## 2023-04-07 DIAGNOSIS — E119 Type 2 diabetes mellitus without complications: Secondary | ICD-10-CM | POA: Diagnosis not present

## 2023-04-07 DIAGNOSIS — K5790 Diverticulosis of intestine, part unspecified, without perforation or abscess without bleeding: Secondary | ICD-10-CM | POA: Diagnosis not present

## 2023-04-07 DIAGNOSIS — G4700 Insomnia, unspecified: Secondary | ICD-10-CM | POA: Diagnosis not present

## 2023-04-07 DIAGNOSIS — Z8616 Personal history of COVID-19: Secondary | ICD-10-CM | POA: Diagnosis not present

## 2023-04-07 DIAGNOSIS — I110 Hypertensive heart disease with heart failure: Secondary | ICD-10-CM | POA: Diagnosis not present

## 2023-04-07 DIAGNOSIS — J45909 Unspecified asthma, uncomplicated: Secondary | ICD-10-CM | POA: Diagnosis not present

## 2023-04-07 DIAGNOSIS — I509 Heart failure, unspecified: Secondary | ICD-10-CM | POA: Diagnosis not present

## 2023-04-07 DIAGNOSIS — M5030 Other cervical disc degeneration, unspecified cervical region: Secondary | ICD-10-CM | POA: Diagnosis not present

## 2023-04-07 DIAGNOSIS — E559 Vitamin D deficiency, unspecified: Secondary | ICD-10-CM | POA: Diagnosis not present

## 2023-04-07 DIAGNOSIS — M797 Fibromyalgia: Secondary | ICD-10-CM | POA: Diagnosis not present

## 2023-04-07 DIAGNOSIS — I4891 Unspecified atrial fibrillation: Secondary | ICD-10-CM | POA: Diagnosis not present

## 2023-04-07 DIAGNOSIS — Z950 Presence of cardiac pacemaker: Secondary | ICD-10-CM | POA: Diagnosis not present

## 2023-04-10 DIAGNOSIS — Z8616 Personal history of COVID-19: Secondary | ICD-10-CM | POA: Diagnosis not present

## 2023-04-10 DIAGNOSIS — I509 Heart failure, unspecified: Secondary | ICD-10-CM | POA: Diagnosis not present

## 2023-04-10 DIAGNOSIS — K219 Gastro-esophageal reflux disease without esophagitis: Secondary | ICD-10-CM | POA: Diagnosis not present

## 2023-04-10 DIAGNOSIS — Z471 Aftercare following joint replacement surgery: Secondary | ICD-10-CM | POA: Diagnosis not present

## 2023-04-10 DIAGNOSIS — I428 Other cardiomyopathies: Secondary | ICD-10-CM | POA: Diagnosis not present

## 2023-04-10 DIAGNOSIS — J45909 Unspecified asthma, uncomplicated: Secondary | ICD-10-CM | POA: Diagnosis not present

## 2023-04-10 DIAGNOSIS — I25119 Atherosclerotic heart disease of native coronary artery with unspecified angina pectoris: Secondary | ICD-10-CM | POA: Diagnosis not present

## 2023-04-10 DIAGNOSIS — M5136 Other intervertebral disc degeneration, lumbar region: Secondary | ICD-10-CM | POA: Diagnosis not present

## 2023-04-10 DIAGNOSIS — M5030 Other cervical disc degeneration, unspecified cervical region: Secondary | ICD-10-CM | POA: Diagnosis not present

## 2023-04-10 DIAGNOSIS — E785 Hyperlipidemia, unspecified: Secondary | ICD-10-CM | POA: Diagnosis not present

## 2023-04-10 DIAGNOSIS — E119 Type 2 diabetes mellitus without complications: Secondary | ICD-10-CM | POA: Diagnosis not present

## 2023-04-10 DIAGNOSIS — M810 Age-related osteoporosis without current pathological fracture: Secondary | ICD-10-CM | POA: Diagnosis not present

## 2023-04-10 DIAGNOSIS — Z96641 Presence of right artificial hip joint: Secondary | ICD-10-CM | POA: Diagnosis not present

## 2023-04-10 DIAGNOSIS — K5790 Diverticulosis of intestine, part unspecified, without perforation or abscess without bleeding: Secondary | ICD-10-CM | POA: Diagnosis not present

## 2023-04-10 DIAGNOSIS — I4891 Unspecified atrial fibrillation: Secondary | ICD-10-CM | POA: Diagnosis not present

## 2023-04-10 DIAGNOSIS — E039 Hypothyroidism, unspecified: Secondary | ICD-10-CM | POA: Diagnosis not present

## 2023-04-10 DIAGNOSIS — Z950 Presence of cardiac pacemaker: Secondary | ICD-10-CM | POA: Diagnosis not present

## 2023-04-10 DIAGNOSIS — I110 Hypertensive heart disease with heart failure: Secondary | ICD-10-CM | POA: Diagnosis not present

## 2023-04-10 DIAGNOSIS — I442 Atrioventricular block, complete: Secondary | ICD-10-CM | POA: Diagnosis not present

## 2023-04-10 DIAGNOSIS — D509 Iron deficiency anemia, unspecified: Secondary | ICD-10-CM | POA: Diagnosis not present

## 2023-04-10 DIAGNOSIS — E559 Vitamin D deficiency, unspecified: Secondary | ICD-10-CM | POA: Diagnosis not present

## 2023-04-10 DIAGNOSIS — M797 Fibromyalgia: Secondary | ICD-10-CM | POA: Diagnosis not present

## 2023-04-10 DIAGNOSIS — G4700 Insomnia, unspecified: Secondary | ICD-10-CM | POA: Diagnosis not present

## 2023-04-12 DIAGNOSIS — Z96641 Presence of right artificial hip joint: Secondary | ICD-10-CM | POA: Diagnosis not present

## 2023-04-12 DIAGNOSIS — E119 Type 2 diabetes mellitus without complications: Secondary | ICD-10-CM | POA: Diagnosis not present

## 2023-04-12 DIAGNOSIS — Z8616 Personal history of COVID-19: Secondary | ICD-10-CM | POA: Diagnosis not present

## 2023-04-12 DIAGNOSIS — M810 Age-related osteoporosis without current pathological fracture: Secondary | ICD-10-CM | POA: Diagnosis not present

## 2023-04-12 DIAGNOSIS — M5030 Other cervical disc degeneration, unspecified cervical region: Secondary | ICD-10-CM | POA: Diagnosis not present

## 2023-04-12 DIAGNOSIS — E785 Hyperlipidemia, unspecified: Secondary | ICD-10-CM | POA: Diagnosis not present

## 2023-04-12 DIAGNOSIS — I4891 Unspecified atrial fibrillation: Secondary | ICD-10-CM | POA: Diagnosis not present

## 2023-04-12 DIAGNOSIS — E039 Hypothyroidism, unspecified: Secondary | ICD-10-CM | POA: Diagnosis not present

## 2023-04-12 DIAGNOSIS — I110 Hypertensive heart disease with heart failure: Secondary | ICD-10-CM | POA: Diagnosis not present

## 2023-04-12 DIAGNOSIS — D509 Iron deficiency anemia, unspecified: Secondary | ICD-10-CM | POA: Diagnosis not present

## 2023-04-12 DIAGNOSIS — I509 Heart failure, unspecified: Secondary | ICD-10-CM | POA: Diagnosis not present

## 2023-04-12 DIAGNOSIS — K219 Gastro-esophageal reflux disease without esophagitis: Secondary | ICD-10-CM | POA: Diagnosis not present

## 2023-04-12 DIAGNOSIS — Z471 Aftercare following joint replacement surgery: Secondary | ICD-10-CM | POA: Diagnosis not present

## 2023-04-12 DIAGNOSIS — Z950 Presence of cardiac pacemaker: Secondary | ICD-10-CM | POA: Diagnosis not present

## 2023-04-12 DIAGNOSIS — M5136 Other intervertebral disc degeneration, lumbar region: Secondary | ICD-10-CM | POA: Diagnosis not present

## 2023-04-12 DIAGNOSIS — E559 Vitamin D deficiency, unspecified: Secondary | ICD-10-CM | POA: Diagnosis not present

## 2023-04-12 DIAGNOSIS — I442 Atrioventricular block, complete: Secondary | ICD-10-CM | POA: Diagnosis not present

## 2023-04-12 DIAGNOSIS — J45909 Unspecified asthma, uncomplicated: Secondary | ICD-10-CM | POA: Diagnosis not present

## 2023-04-12 DIAGNOSIS — M797 Fibromyalgia: Secondary | ICD-10-CM | POA: Diagnosis not present

## 2023-04-12 DIAGNOSIS — G4700 Insomnia, unspecified: Secondary | ICD-10-CM | POA: Diagnosis not present

## 2023-04-12 DIAGNOSIS — I428 Other cardiomyopathies: Secondary | ICD-10-CM | POA: Diagnosis not present

## 2023-04-12 DIAGNOSIS — K5790 Diverticulosis of intestine, part unspecified, without perforation or abscess without bleeding: Secondary | ICD-10-CM | POA: Diagnosis not present

## 2023-04-12 DIAGNOSIS — I25119 Atherosclerotic heart disease of native coronary artery with unspecified angina pectoris: Secondary | ICD-10-CM | POA: Diagnosis not present

## 2023-04-13 NOTE — Progress Notes (Signed)
Remote pacemaker transmission.   

## 2023-04-14 DIAGNOSIS — M6281 Muscle weakness (generalized): Secondary | ICD-10-CM | POA: Diagnosis not present

## 2023-04-14 DIAGNOSIS — Z7409 Other reduced mobility: Secondary | ICD-10-CM | POA: Diagnosis not present

## 2023-04-14 DIAGNOSIS — M25651 Stiffness of right hip, not elsewhere classified: Secondary | ICD-10-CM | POA: Diagnosis not present

## 2023-04-14 DIAGNOSIS — M87051 Idiopathic aseptic necrosis of right femur: Secondary | ICD-10-CM | POA: Diagnosis not present

## 2023-04-14 DIAGNOSIS — R29898 Other symptoms and signs involving the musculoskeletal system: Secondary | ICD-10-CM | POA: Diagnosis not present

## 2023-04-24 DIAGNOSIS — Z96641 Presence of right artificial hip joint: Secondary | ICD-10-CM | POA: Diagnosis not present

## 2023-04-24 DIAGNOSIS — R29898 Other symptoms and signs involving the musculoskeletal system: Secondary | ICD-10-CM | POA: Diagnosis not present

## 2023-04-24 DIAGNOSIS — M25651 Stiffness of right hip, not elsewhere classified: Secondary | ICD-10-CM | POA: Diagnosis not present

## 2023-04-24 DIAGNOSIS — M6281 Muscle weakness (generalized): Secondary | ICD-10-CM | POA: Diagnosis not present

## 2023-04-24 DIAGNOSIS — Z7409 Other reduced mobility: Secondary | ICD-10-CM | POA: Diagnosis not present

## 2023-04-24 DIAGNOSIS — M87051 Idiopathic aseptic necrosis of right femur: Secondary | ICD-10-CM | POA: Diagnosis not present

## 2023-04-28 DIAGNOSIS — M25651 Stiffness of right hip, not elsewhere classified: Secondary | ICD-10-CM | POA: Diagnosis not present

## 2023-04-28 DIAGNOSIS — R29898 Other symptoms and signs involving the musculoskeletal system: Secondary | ICD-10-CM | POA: Diagnosis not present

## 2023-04-28 DIAGNOSIS — Z7409 Other reduced mobility: Secondary | ICD-10-CM | POA: Diagnosis not present

## 2023-04-28 DIAGNOSIS — Z96641 Presence of right artificial hip joint: Secondary | ICD-10-CM | POA: Diagnosis not present

## 2023-04-28 DIAGNOSIS — M87051 Idiopathic aseptic necrosis of right femur: Secondary | ICD-10-CM | POA: Diagnosis not present

## 2023-05-01 ENCOUNTER — Ambulatory Visit: Payer: Medicare Other | Attending: Internal Medicine

## 2023-05-01 DIAGNOSIS — Z950 Presence of cardiac pacemaker: Secondary | ICD-10-CM | POA: Diagnosis not present

## 2023-05-01 DIAGNOSIS — Z7409 Other reduced mobility: Secondary | ICD-10-CM | POA: Diagnosis not present

## 2023-05-01 DIAGNOSIS — M25651 Stiffness of right hip, not elsewhere classified: Secondary | ICD-10-CM | POA: Diagnosis not present

## 2023-05-01 DIAGNOSIS — I5022 Chronic systolic (congestive) heart failure: Secondary | ICD-10-CM | POA: Diagnosis not present

## 2023-05-01 DIAGNOSIS — R29898 Other symptoms and signs involving the musculoskeletal system: Secondary | ICD-10-CM | POA: Diagnosis not present

## 2023-05-01 DIAGNOSIS — Z96641 Presence of right artificial hip joint: Secondary | ICD-10-CM | POA: Diagnosis not present

## 2023-05-01 DIAGNOSIS — M87051 Idiopathic aseptic necrosis of right femur: Secondary | ICD-10-CM | POA: Diagnosis not present

## 2023-05-03 DIAGNOSIS — M25651 Stiffness of right hip, not elsewhere classified: Secondary | ICD-10-CM | POA: Diagnosis not present

## 2023-05-03 DIAGNOSIS — Z96641 Presence of right artificial hip joint: Secondary | ICD-10-CM | POA: Diagnosis not present

## 2023-05-03 DIAGNOSIS — M87051 Idiopathic aseptic necrosis of right femur: Secondary | ICD-10-CM | POA: Diagnosis not present

## 2023-05-03 DIAGNOSIS — R29898 Other symptoms and signs involving the musculoskeletal system: Secondary | ICD-10-CM | POA: Diagnosis not present

## 2023-05-03 DIAGNOSIS — Z7409 Other reduced mobility: Secondary | ICD-10-CM | POA: Diagnosis not present

## 2023-05-04 ENCOUNTER — Other Ambulatory Visit (HOSPITAL_COMMUNITY): Payer: Self-pay | Admitting: Internal Medicine

## 2023-05-04 ENCOUNTER — Telehealth: Payer: Self-pay

## 2023-05-04 DIAGNOSIS — H9221 Otorrhagia, right ear: Secondary | ICD-10-CM | POA: Diagnosis not present

## 2023-05-04 DIAGNOSIS — M25551 Pain in right hip: Secondary | ICD-10-CM | POA: Diagnosis not present

## 2023-05-04 DIAGNOSIS — Z96641 Presence of right artificial hip joint: Secondary | ICD-10-CM | POA: Diagnosis not present

## 2023-05-04 DIAGNOSIS — N179 Acute kidney failure, unspecified: Secondary | ICD-10-CM | POA: Diagnosis not present

## 2023-05-04 DIAGNOSIS — G8918 Other acute postprocedural pain: Secondary | ICD-10-CM | POA: Diagnosis not present

## 2023-05-04 NOTE — Telephone Encounter (Signed)
Remote ICM transmission received.  Attempted call to husband regarding ICM remote transmission and left detailed message per DPR.  Left ICM phone number and advised to return call for any fluid symptoms or questions. Next ICM remote transmission scheduled 06/05/2023.

## 2023-05-04 NOTE — Progress Notes (Signed)
EPIC Encounter for ICM Monitoring  Patient Name: Melinda Mckinney is a 67 y.o. female Date: 05/04/2023 Primary Care Physican: Jeralyn Bennett, MD Primary Cardiologist: Shirlee Latch Electrophysiologist: Rae Roam Pacing: 99.7%         02/08/2022 Weight: 214 lbs 10/05/2022 Weight: 209 lbs 01/16/2023 Weight: 215 lbs    Time in AT/AF  24.0 hr/day (100.0%)             Attempted call to husband and unable to reach.  Left detailed message per DPR regarding transmission. Transmission reviewed.    Diet:  Does not follow low salt diet.     Optivol thoracic impedance suggesting normal fluid levels with the exception of possible fluid accumulation 8/15-9/4 following total hip surgery.    Difficulty maintaining normal fluid levels in the past year.   Prescribed:  Torsemide 20 mg take 2 tablets (40 mg total) by mouth every morning and 1 tablet (20 mg total) every afternoon. Potassium 20 mEq take 1 tablet daily  Spironolactone 25 mg take 0.5 tablet (12.5 mg total) by mouth at bedrime Farxiga 10 mg take 1 tablet daily   Labs: 03/31/2023 Creatinine 1.94, GFR 28  03/28/2023 Creatinine 1.67, BUN 36, Potassium 4.5, Sodium 136, GFR 33  03/20/2023 Creatinine 1.93, BUN 33, Potassium 3.2, Sodium 137, GFR 28  03/01/2023 Creatinine 1.68, BUN 24, Potassium 3.7, Sodium 139, GFR 33 01/24/2023 Creatinine 1.54, BUN 22, Potassium 3.6, Sodium 139, GFR 36 02/09/2022 Creatinine 1.87, BUN 49, Potassium 4.6, Sodium 139, GFR 29 11/04/2021 Creatinine 1.5,   BUN 27, Potassium 5.2, Sodium 142 10/26/2021 Creatinine 1.62, BUN 31, Potassium 4.7, Sodium 140  A complete set of results can be found in Results Review.   Recommendations:  Left voice mail with ICM number and encouraged to call if experiencing any fluid symptoms.   Follow-up plan: ICM clinic phone appointment on 06/05/2023.   91 day device clinic remote transmission 07/03/2023.     EP/Cardiology Office Visits:  06/19/2023 with HF clinic.    Recall 06/08/2023 with Dr  Nelly Laurence.       Copy of ICM check sent to Dr. Ladona Ridgel.     3 month ICM trend: 05/01/2023.    12-14 Month ICM trend:     Karie Soda, RN 05/04/2023 7:18 AM

## 2023-05-05 DIAGNOSIS — N179 Acute kidney failure, unspecified: Secondary | ICD-10-CM | POA: Diagnosis not present

## 2023-05-10 DIAGNOSIS — Z96641 Presence of right artificial hip joint: Secondary | ICD-10-CM | POA: Diagnosis not present

## 2023-05-10 DIAGNOSIS — M1611 Unilateral primary osteoarthritis, right hip: Secondary | ICD-10-CM | POA: Diagnosis not present

## 2023-05-10 DIAGNOSIS — M1610 Unilateral primary osteoarthritis, unspecified hip: Secondary | ICD-10-CM | POA: Diagnosis not present

## 2023-05-10 DIAGNOSIS — M722 Plantar fascial fibromatosis: Secondary | ICD-10-CM | POA: Diagnosis not present

## 2023-05-15 DIAGNOSIS — R29898 Other symptoms and signs involving the musculoskeletal system: Secondary | ICD-10-CM | POA: Diagnosis not present

## 2023-05-15 DIAGNOSIS — Z96641 Presence of right artificial hip joint: Secondary | ICD-10-CM | POA: Diagnosis not present

## 2023-05-15 DIAGNOSIS — Z7409 Other reduced mobility: Secondary | ICD-10-CM | POA: Diagnosis not present

## 2023-05-15 DIAGNOSIS — M87051 Idiopathic aseptic necrosis of right femur: Secondary | ICD-10-CM | POA: Diagnosis not present

## 2023-05-15 DIAGNOSIS — M25651 Stiffness of right hip, not elsewhere classified: Secondary | ICD-10-CM | POA: Diagnosis not present

## 2023-05-15 DIAGNOSIS — M6281 Muscle weakness (generalized): Secondary | ICD-10-CM | POA: Diagnosis not present

## 2023-05-16 ENCOUNTER — Other Ambulatory Visit: Payer: Self-pay | Admitting: Internal Medicine

## 2023-05-16 DIAGNOSIS — M810 Age-related osteoporosis without current pathological fracture: Secondary | ICD-10-CM

## 2023-05-17 DIAGNOSIS — Z7409 Other reduced mobility: Secondary | ICD-10-CM | POA: Diagnosis not present

## 2023-05-17 DIAGNOSIS — Z96641 Presence of right artificial hip joint: Secondary | ICD-10-CM | POA: Diagnosis not present

## 2023-05-17 DIAGNOSIS — M25651 Stiffness of right hip, not elsewhere classified: Secondary | ICD-10-CM | POA: Diagnosis not present

## 2023-05-17 DIAGNOSIS — R29898 Other symptoms and signs involving the musculoskeletal system: Secondary | ICD-10-CM | POA: Diagnosis not present

## 2023-05-17 DIAGNOSIS — M87051 Idiopathic aseptic necrosis of right femur: Secondary | ICD-10-CM | POA: Diagnosis not present

## 2023-05-17 DIAGNOSIS — M6281 Muscle weakness (generalized): Secondary | ICD-10-CM | POA: Diagnosis not present

## 2023-05-22 DIAGNOSIS — Z96641 Presence of right artificial hip joint: Secondary | ICD-10-CM | POA: Diagnosis not present

## 2023-05-22 DIAGNOSIS — M25651 Stiffness of right hip, not elsewhere classified: Secondary | ICD-10-CM | POA: Diagnosis not present

## 2023-05-22 DIAGNOSIS — Z7409 Other reduced mobility: Secondary | ICD-10-CM | POA: Diagnosis not present

## 2023-05-22 DIAGNOSIS — M87051 Idiopathic aseptic necrosis of right femur: Secondary | ICD-10-CM | POA: Diagnosis not present

## 2023-05-22 DIAGNOSIS — M6281 Muscle weakness (generalized): Secondary | ICD-10-CM | POA: Diagnosis not present

## 2023-05-22 DIAGNOSIS — R29898 Other symptoms and signs involving the musculoskeletal system: Secondary | ICD-10-CM | POA: Diagnosis not present

## 2023-05-23 DIAGNOSIS — Z96641 Presence of right artificial hip joint: Secondary | ICD-10-CM | POA: Diagnosis not present

## 2023-05-24 DIAGNOSIS — Z7409 Other reduced mobility: Secondary | ICD-10-CM | POA: Diagnosis not present

## 2023-05-24 DIAGNOSIS — M25651 Stiffness of right hip, not elsewhere classified: Secondary | ICD-10-CM | POA: Diagnosis not present

## 2023-05-24 DIAGNOSIS — R29898 Other symptoms and signs involving the musculoskeletal system: Secondary | ICD-10-CM | POA: Diagnosis not present

## 2023-05-24 DIAGNOSIS — M6281 Muscle weakness (generalized): Secondary | ICD-10-CM | POA: Diagnosis not present

## 2023-05-24 DIAGNOSIS — Z96641 Presence of right artificial hip joint: Secondary | ICD-10-CM | POA: Diagnosis not present

## 2023-05-24 DIAGNOSIS — M87051 Idiopathic aseptic necrosis of right femur: Secondary | ICD-10-CM | POA: Diagnosis not present

## 2023-05-29 DIAGNOSIS — Z96641 Presence of right artificial hip joint: Secondary | ICD-10-CM | POA: Diagnosis not present

## 2023-05-29 DIAGNOSIS — M87051 Idiopathic aseptic necrosis of right femur: Secondary | ICD-10-CM | POA: Diagnosis not present

## 2023-05-29 DIAGNOSIS — R29898 Other symptoms and signs involving the musculoskeletal system: Secondary | ICD-10-CM | POA: Diagnosis not present

## 2023-05-29 DIAGNOSIS — Z7409 Other reduced mobility: Secondary | ICD-10-CM | POA: Diagnosis not present

## 2023-05-29 DIAGNOSIS — M25551 Pain in right hip: Secondary | ICD-10-CM | POA: Diagnosis not present

## 2023-05-29 DIAGNOSIS — M6281 Muscle weakness (generalized): Secondary | ICD-10-CM | POA: Diagnosis not present

## 2023-05-29 DIAGNOSIS — M25651 Stiffness of right hip, not elsewhere classified: Secondary | ICD-10-CM | POA: Diagnosis not present

## 2023-05-31 DIAGNOSIS — R29898 Other symptoms and signs involving the musculoskeletal system: Secondary | ICD-10-CM | POA: Diagnosis not present

## 2023-05-31 DIAGNOSIS — Z7409 Other reduced mobility: Secondary | ICD-10-CM | POA: Diagnosis not present

## 2023-05-31 DIAGNOSIS — M87051 Idiopathic aseptic necrosis of right femur: Secondary | ICD-10-CM | POA: Diagnosis not present

## 2023-05-31 DIAGNOSIS — M6281 Muscle weakness (generalized): Secondary | ICD-10-CM | POA: Diagnosis not present

## 2023-05-31 DIAGNOSIS — M25651 Stiffness of right hip, not elsewhere classified: Secondary | ICD-10-CM | POA: Diagnosis not present

## 2023-05-31 DIAGNOSIS — Z96641 Presence of right artificial hip joint: Secondary | ICD-10-CM | POA: Diagnosis not present

## 2023-05-31 DIAGNOSIS — M25551 Pain in right hip: Secondary | ICD-10-CM | POA: Diagnosis not present

## 2023-06-05 ENCOUNTER — Ambulatory Visit: Payer: Medicare Other | Attending: Internal Medicine

## 2023-06-05 DIAGNOSIS — Z950 Presence of cardiac pacemaker: Secondary | ICD-10-CM | POA: Diagnosis not present

## 2023-06-05 DIAGNOSIS — I5022 Chronic systolic (congestive) heart failure: Secondary | ICD-10-CM | POA: Diagnosis not present

## 2023-06-07 DIAGNOSIS — M25651 Stiffness of right hip, not elsewhere classified: Secondary | ICD-10-CM | POA: Diagnosis not present

## 2023-06-07 DIAGNOSIS — M6281 Muscle weakness (generalized): Secondary | ICD-10-CM | POA: Diagnosis not present

## 2023-06-07 DIAGNOSIS — Z7409 Other reduced mobility: Secondary | ICD-10-CM | POA: Diagnosis not present

## 2023-06-07 DIAGNOSIS — R29898 Other symptoms and signs involving the musculoskeletal system: Secondary | ICD-10-CM | POA: Diagnosis not present

## 2023-06-07 DIAGNOSIS — M87051 Idiopathic aseptic necrosis of right femur: Secondary | ICD-10-CM | POA: Diagnosis not present

## 2023-06-07 DIAGNOSIS — Z96641 Presence of right artificial hip joint: Secondary | ICD-10-CM | POA: Diagnosis not present

## 2023-06-08 NOTE — Progress Notes (Signed)
EPIC Encounter for ICM Monitoring  Patient Name: Melinda Mckinney is a 67 y.o. female Date: 06/08/2023 Primary Care Physican: Jeralyn Bennett, MD Primary Cardiologist: Shirlee Latch Electrophysiologist: Rae Roam Pacing: 99.7%         02/08/2022 Weight: 214 lbs 10/05/2022 Weight: 209 lbs 01/16/2023 Weight: 215 lbs    Time in AT/AF  24.0 hr/day (100.0%)             Transmission reviewed.    Diet:  Does not follow low salt diet.     Optivol thoracic impedance suggesting normal fluid levels since 9/5.   Prescribed:  Torsemide 20 mg take 2 tablets (40 mg total) by mouth every morning and 1 tablet (20 mg total) every afternoon. Potassium 20 mEq take 1 tablet daily  Spironolactone 25 mg take 0.5 tablet (12.5 mg total) by mouth at bedrime Farxiga 10 mg take 1 tablet daily    Labs: 03/31/2023 Creatinine 1.94, GFR 28  03/28/2023 Creatinine 1.67, BUN 36, Potassium 4.5, Sodium 136, GFR 33  03/20/2023 Creatinine 1.93, BUN 33, Potassium 3.2, Sodium 137, GFR 28  03/01/2023 Creatinine 1.68, BUN 24, Potassium 3.7, Sodium 139, GFR 33 01/24/2023 Creatinine 1.54, BUN 22, Potassium 3.6, Sodium 139, GFR 36 02/09/2022 Creatinine 1.87, BUN 49, Potassium 4.6, Sodium 139, GFR 29 11/04/2021 Creatinine 1.5,   BUN 27, Potassium 5.2, Sodium 142 10/26/2021 Creatinine 1.62, BUN 31, Potassium 4.7, Sodium 140  A complete set of results can be found in Results Review.   Recommendations:  No changes   Follow-up plan: ICM clinic phone appointment on 07/17/2023.   91 day device clinic remote transmission 07/03/2023.     EP/Cardiology Office Visits:  06/19/2023 with HF clinic.    Recall 06/08/2023 with Dr Nelly Laurence.       Copy of ICM check sent to Dr. Ladona Ridgel.     3 month ICM trend: 06/05/2023.    12-14 Month ICM trend:     Karie Soda, RN 06/08/2023 12:06 PM

## 2023-06-12 DIAGNOSIS — R29898 Other symptoms and signs involving the musculoskeletal system: Secondary | ICD-10-CM | POA: Diagnosis not present

## 2023-06-12 DIAGNOSIS — Z96641 Presence of right artificial hip joint: Secondary | ICD-10-CM | POA: Diagnosis not present

## 2023-06-12 DIAGNOSIS — Z7409 Other reduced mobility: Secondary | ICD-10-CM | POA: Diagnosis not present

## 2023-06-12 DIAGNOSIS — M25651 Stiffness of right hip, not elsewhere classified: Secondary | ICD-10-CM | POA: Diagnosis not present

## 2023-06-12 DIAGNOSIS — M6281 Muscle weakness (generalized): Secondary | ICD-10-CM | POA: Diagnosis not present

## 2023-06-12 DIAGNOSIS — M87051 Idiopathic aseptic necrosis of right femur: Secondary | ICD-10-CM | POA: Diagnosis not present

## 2023-06-14 DIAGNOSIS — M6281 Muscle weakness (generalized): Secondary | ICD-10-CM | POA: Diagnosis not present

## 2023-06-14 DIAGNOSIS — R29898 Other symptoms and signs involving the musculoskeletal system: Secondary | ICD-10-CM | POA: Diagnosis not present

## 2023-06-14 DIAGNOSIS — M25651 Stiffness of right hip, not elsewhere classified: Secondary | ICD-10-CM | POA: Diagnosis not present

## 2023-06-14 DIAGNOSIS — M87051 Idiopathic aseptic necrosis of right femur: Secondary | ICD-10-CM | POA: Diagnosis not present

## 2023-06-14 DIAGNOSIS — Z96641 Presence of right artificial hip joint: Secondary | ICD-10-CM | POA: Diagnosis not present

## 2023-06-14 DIAGNOSIS — Z7409 Other reduced mobility: Secondary | ICD-10-CM | POA: Diagnosis not present

## 2023-06-16 NOTE — Progress Notes (Signed)
PCP: Jeralyn Bennett, MD  Nephrology: Dr. Thedore Mins HF Cardiology: Dr. Shirlee Latch  67 y.o. with CAD s/p PCI in 2010, chronic combined systolic and diastolic HF (EF 11-91% in 2018), atrial fibrillation on chronic anticoagulation w/ Xarelto, HTN, and hypothyroidism was diagnosed w/ COVID-19 in Sep 2020 and treated at [facility]. COVID infection c/b PNA. She was discharged and readmitted back to [facility] 10/9-10/13/20 for gastroenteritis, felt to be a sequela of COVID-19. She was managed w/ supportive care and discharged home. Had f/u with PCP 10/22 and endorsed worsening LLQ pain leading to abdominal CT which showed acute diverticulitis. She was started on outpatient antibiotics, cipro + flagyl but symptoms failed to improve, prompting her to seek emergency medical care at [facility].  Initial EKG in the ED showed afib w/ RVR for which general cardiology was consulted. She was started on IV dilt for rate control but later required treatment w/ IV amiodarone due to persistent rapid ventricular response. Hospital course c/b volume overload, requiring IV diuretics. 2D echo was obtained and showed worsening LVEF, now 25-30% w/ moderate MR. She underwent subsequent R/LHC in 11/20 which demonstrated widely patent coronaries. RHC showed elevated mean RA pressure at 22, PWP 43, LVEDP 45 and low CO. CI by Fick 1.6. She underwent TEE-guided DCCV, TEE showed EF 15% with moderate RV dysfunction and moderate MR.  She went back into rapid atrial fibrillation.  She failed additional cardioversions.  Despite amiodarone gtt, HR remained in the 140s-150s.  Finally, she underwent AV nodal ablation with BiV pacing due to inability to control atrial fibrillation. While in the hospital, she was on milrinone gtt for a period of time, but we were able to taper it off. She was discharged home after medication optimization.   Echo in 2/21 showed EF up to 40-45%, mild diffuse hypokinesis, RV low normal function.  Echo in 3/22 showed EF  55-60%, RV normal, severe LAE.    Today she returns for HF follow up with her husband and interpretor. She has avascular necrosis of the right hip and is planned for THR in 8/24.  She is limited by hip pain but denies exertional dyspnea or chest pain.  BP is low today but she denies lightheadedness. She says that her SBP is usually in the 90s.  She is not currently on spironolactone. Weight up 4 lbs.   ECG (personally reviewed): Atrial fibrillation with BiV pacing  Labs (11/20): digoxin level 1.4, creatinine 1.81, K 4.8, hgb 13.9 Labs (1/21): LDL 91, K 3.7, creatinine 4.78 Labs (2/21): K 5.1, creatinine 1.37, TSH normal, digoxin 0.4 Labs (5/21): LDL 68, digoxin 0.3 Labs (2/95): K 4.4, creatinine 1.21 Labs (1/22): K 4, creatinine 1.65, TSH normal Labs (7/22): LDL 78, K 4.3, creatinine 6.21 Labs (11/22): K 4.8, creatinine 2.4 Labs (3/23): hgb 11.1 Labs (6/23): K 4.6, creatinine 1.87 Labs (2/24): LDL 59 Labs (7/24): K 3.7, creatinine 1.68  PMH: 1. Hypothyroidism 2. Single kidney s/p nephrectomy 3. Atrial fibrillation: Initially diagnosed in 2018.  She was on sotalol at one point to maintain NSR.  - Uncontrollable atrial fibrillation during 11/20 admission, patient eventually had AV nodal ablation with CRT-P implantation.  4. H/o achalasia 5. GERD 6. Fibromyalgia 7. H/o diverticulitis in 10/20 8. COVID-19 in 9/20.  9. H/o cervical cancer: cryotherapy.  10. Degenerative disc disease.  11. Chronic systolic CHF: Nonischemic cardiomyopathy, possibly tachycardia-mediated in setting of uncontrolled atrial fibrillation. Medtronic CRT-P device.  - Echo (2018): EF 45-50%.  - Echo (11/20): EF 25-30%, mildly decreased  RV systolic function, moderate MR.  - TEE (11/20): EF 15%, moderately decreased RV systolic function, moderate MR.  - Cardiac MRI (11/20): EF 34%, RV EF 30%, no LGE (done after AV nodal ablation and BiV pacing).  - RHC (11/20): mean RA 22, mean PCWP 43, LVEDP 45, CI 1.6 (Fick) and  2.7 (thermodilution).  - Echo (2/21): EF 40-45%, mild diffuse hypokinesis, low normal RV systolic function.  - Echo (5/22): EF 55-60%, RV normal, severe LAE 12. CAD: PCI in 2010.  - LHC (11/20): No significant coronary disease.  13. Avascular necrosis left hip  Social History   Socioeconomic History   Marital status: Married    Spouse name: Not on file   Number of children: 3   Years of education: Not on file   Highest education level: Not on file  Occupational History   Occupation: stay home   Tobacco Use   Smoking status: Former    Current packs/day: 0.00    Average packs/day: 2.0 packs/day for 28.0 years (56.0 ttl pk-yrs)    Types: Cigarettes    Start date: 25    Quit date: 2000    Years since quitting: 24.8   Smokeless tobacco: Never  Vaping Use   Vaping status: Never Used  Substance and Sexual Activity   Alcohol use: Yes    Alcohol/week: 5.0 standard drinks of alcohol    Types: 5 Cans of beer per week    Comment: 07/27/2017 "3-4 drinks//month"   Drug use: No   Sexual activity: Not Currently    Comment: 1ST INTERCOURSE- 16, PARTNERS - 2  Other Topics Concern   Not on file  Social History Narrative   Original from [place]   3 children, 2 alive   Household --pt and husband    Social Determinants of Health   Financial Resource Strain: Low Risk  (11/29/2021)   Overall Financial Resource Strain (CARDIA)    Difficulty of Paying Living Expenses: Not hard at all  Food Insecurity: Low Risk  (03/30/2023)   Received from [facility]   Hunger Vital Sign    Worried About Running Out of Food in the Last Year: Never true    Ran Out of Food in the Last Year: Never true  Transportation Needs: Not on file (03/30/2023)  Physical Activity: Inactive (11/29/2021)   Exercise Vital Sign    Days of Exercise per Week: 0 days    Minutes of Exercise per Session: 0 min  Stress: No Stress Concern Present (11/29/2021)   [employer] of Occupational Health - Occupational Stress  Questionnaire    Feeling of Stress : Not at all  Social Connections: Moderately Integrated (11/29/2021)   Social Connection and Isolation Panel [NHANES]    Frequency of Communication with Friends and Family: More than three times a week    Frequency of Social Gatherings with Friends and Family: More than three times a week    Attends Religious Services: Never    Database administrator or Organizations: Yes    Attends Engineer, structural: More than 4 times per year    Marital Status: Married  Catering manager Violence: Not At Risk (11/29/2021)   Humiliation, Afraid, Rape, and Kick questionnaire    Fear of Current or Ex-Partner: No    Emotionally Abused: No    Physically Abused: No    Sexually Abused: No   Family History  Problem Relation Age of Onset   Breast cancer Other  aunt    CAD Brother    Lung cancer Mother        F and M   Diabetes Father        F and mother    CAD Father    Lung cancer Father    Stroke Sister    Colon cancer Neg Hx    ROS: All systems reviewed and negative except as per HPI.   Current Outpatient Medications  Medication Sig Dispense Refill   alendronate (FOSAMAX) 70 MG tablet TAKE 1 TABLET BY MOUTH ONCE A WEEK ON AN EMPTY STOMACH BEFORE BREAKFAST. REMAIN UPRIGHT FOR 30 MINUTES AND TAKE WITH A FULL GLASS OF WATER 12 tablet 1   allopurinol (ZYLOPRIM) 100 MG tablet Take 1 tablet (100 mg total) by mouth daily. 90 tablet 1   atorvastatin (LIPITOR) 40 MG tablet Take 1.5 tablets (60 mg total) by mouth at bedtime. 135 tablet 1   carvedilol (COREG) 6.25 MG tablet TAKE ONE TABLET BY MOUTH TWICE A DAY WITH A MEAL 180 tablet 3   Cholecalciferol (VITAMIN D3) 5000 UNITS CAPS Take 5,000 Units by mouth daily.      clobetasol cream (TEMOVATE) 0.05 % Apply topically 2 (two) times daily as needed. 60 g 1   cyclobenzaprine (FLEXERIL) 10 MG tablet Take 1 tablet (10 mg total) by mouth 2 (two) times daily as needed for muscle spasms. 60 tablet 1   DULoxetine  (CYMBALTA) 60 MG capsule TAKE 1 CAPSULE BY MOUTH EVERY DAY 90 capsule 1   FARXIGA 10 MG TABS tablet Take 1 tablet (10 mg total) by mouth daily before breakfast. 30 tablet 11   ferrous fumarate (HEMOCYTE - 106 MG FE) 325 (106 Fe) MG TABS tablet Take 1 tablet (106 mg of iron total) by mouth 2 (two) times daily. Do not take at the same time as omeprazole (Prilosec) 60 tablet 6   ketoconazole (NIZORAL) 2 % cream Apply 1 application topically daily. 60 g 0   levothyroxine (SYNTHROID) 112 MCG tablet Take 1 tablet (112 mcg total) by mouth daily before breakfast. 90 tablet 1   metFORMIN (GLUCOPHAGE) 500 MG tablet Take 1 tablet (500 mg total) by mouth 2 (two) times daily with a meal. 180 tablet 0   omeprazole (PRILOSEC) 20 MG capsule TAKE 1 CAPSULE(20 MG) BY MOUTH TWICE DAILY BEFORE A MEAL 60 capsule 1   potassium chloride SA (KLOR-CON M) 20 MEQ tablet Take 1 tablet (20 mEq total) by mouth daily. 30 tablet 11   Rivaroxaban (XARELTO) 15 MG TABS tablet Take 1 tablet (15 mg total) by mouth daily with supper. 90 tablet 3   sacubitril-valsartan (ENTRESTO) 24-26 MG Take 1 tablet by mouth 2 (two) times daily. 60 tablet 6   sacubitril-valsartan (ENTRESTO) 49-51 MG Take 1 tablet by mouth 2 (two) times daily. 60 tablet 3   spironolactone (ALDACTONE) 25 MG tablet Take 0.5 tablets (12.5 mg total) by mouth daily. (Patient not taking: Reported on 03/08/2023) 90 tablet 3   spironolactone (ALDACTONE) 25 MG tablet Take 0.5 tablets (12.5 mg total) by mouth at bedtime. 90 tablet 3   torsemide (DEMADEX) 20 MG tablet Take 2 tablets (40 mg total) by mouth every morning AND 1 tablet (20 mg total) every evening. 60 tablet 11   triamcinolone (KENALOG) 0.025 % ointment Apply topically as needed.     No current facility-administered medications for this visit.   Wt Readings from Last 3 Encounters:  03/08/23 98.8 kg (217 lb 12.8 oz)  10/19/22 98.6 kg (217  lb 6.4 oz)  10/11/22 98.7 kg (217 lb 8 oz)   There were no vitals taken for  this visit. General: NAD Neck: No JVD, no thyromegaly or thyroid nodule.  Lungs: Clear to auscultation bilaterally with normal respiratory effort. CV: Nondisplaced PMI.  Heart regular S1/S2, no S3/S4, no murmur.  No peripheral edema.  No carotid bruit.  Normal pedal pulses.  Abdomen: Soft, nontender, no hepatosplenomegaly, no distention.  Skin: Intact without lesions or rashes.  Neurologic: Alert and oriented x 3.  Psych: Normal affect. Extremities: No clubbing or cyanosis.  HEENT: Normal.   Assessment/Plan: 1. Chronic systolic CHF:  Nonischemic cardiomyopathy.  Echo in 2018 with EF 45-50%.  Echo in 11/20 with EF 25-30%, diffuse hypokinesis, mildly decreased RV systolic function, moderate MR. LHC/RHC in 11/20 with no significant coronary disease, markedly elevated filling pressures and low cardiac output (CI 1.6).  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 Cardiac MRI 11/20 showed LVEF 34%, RVEF 30%, no LGE => perhaps pointing to tachy-mediated CMP as more likely etiology.  Initially required inotropic support w/ milrinone but able to titrate off.  Now s/p AV nodal ablation with MDT CRT-P.  Echo in 2/21 showed EF up to 40-45%, echo in 3/22 with EF up to 55-60%.  NYHA class I-II, not volume overloaded. BP is low.  - Continue torsemide 40 mg bid.   - Decrease Entresto to 24/26 bid with hypotension.   - Restart spironolactone at 12.5 mg daily but take at bedtime.  BMET/BNP in 10 days.  - Continue dapagliflozin 10 mg daily.  - Continue Coreg 6.25 mg bid.   - I will arrange for repeat echo prior to surgery.   2. Atrial fibrillation: Persistent atrial fibrillation, it appears, at least since 9/20 when she was admitted with COVID-19. Prior, she had paroxysmal atrial fibrillation and was maintained on sotalol. She is now off sotalol.  Unable to cardiovert or rate control atrial fibrillation.  Therefore, she had AV nodal ablation with BiV pacing.  - Continue Xarelto 15 mg daily.  3. Mitral regurgitation: Functional MR, moderate on TEE.  Rate was controlled on cardiac MRI, MR was less impressive.  Minimal MR on subsequent echoes.  4. CKD stage 3: She is on [place].  5. Right hip avascular necrosis: She is scheduled for THR in [place] in 8/24.   - Echo prior to surgery.  - Hold Xarelto 3 days prior to surgery, restart afterwards.   Follow up in 4 months with APP  [facility]  06/16/2023

## 2023-06-19 ENCOUNTER — Encounter (HOSPITAL_COMMUNITY): Payer: Self-pay

## 2023-06-19 ENCOUNTER — Ambulatory Visit (HOSPITAL_COMMUNITY)
Admission: RE | Admit: 2023-06-19 | Discharge: 2023-06-19 | Disposition: A | Discharge status: Home / Self Care | Payer: Medicare Other | Source: Ambulatory Visit | Attending: Family Medicine

## 2023-06-19 VITALS — BP 92/60 | HR 79 | Wt 220.6 lb

## 2023-06-19 DIAGNOSIS — I130 Hypertensive heart and chronic kidney disease with heart failure and stage 1 through stage 4 chronic kidney disease, or unspecified chronic kidney disease: Secondary | ICD-10-CM | POA: Insufficient documentation

## 2023-06-19 DIAGNOSIS — I428 Other cardiomyopathies: Secondary | ICD-10-CM | POA: Diagnosis not present

## 2023-06-19 DIAGNOSIS — I2510 Atherosclerotic heart disease of native coronary artery without angina pectoris: Secondary | ICD-10-CM | POA: Insufficient documentation

## 2023-06-19 DIAGNOSIS — I340 Nonrheumatic mitral (valve) insufficiency: Secondary | ICD-10-CM | POA: Insufficient documentation

## 2023-06-19 DIAGNOSIS — I4819 Other persistent atrial fibrillation: Secondary | ICD-10-CM | POA: Insufficient documentation

## 2023-06-19 DIAGNOSIS — Z87891 Personal history of nicotine dependence: Secondary | ICD-10-CM | POA: Diagnosis not present

## 2023-06-19 DIAGNOSIS — Z7901 Long term (current) use of anticoagulants: Secondary | ICD-10-CM | POA: Diagnosis not present

## 2023-06-19 DIAGNOSIS — I5022 Chronic systolic (congestive) heart failure: Secondary | ICD-10-CM | POA: Insufficient documentation

## 2023-06-19 DIAGNOSIS — Z955 Presence of coronary angioplasty implant and graft: Secondary | ICD-10-CM | POA: Insufficient documentation

## 2023-06-19 DIAGNOSIS — N183 Chronic kidney disease, stage 3 unspecified: Secondary | ICD-10-CM | POA: Diagnosis not present

## 2023-06-19 DIAGNOSIS — N1832 Chronic kidney disease, stage 3b: Secondary | ICD-10-CM

## 2023-06-19 DIAGNOSIS — I5042 Chronic combined systolic (congestive) and diastolic (congestive) heart failure: Secondary | ICD-10-CM | POA: Diagnosis present

## 2023-06-19 DIAGNOSIS — I4821 Permanent atrial fibrillation: Secondary | ICD-10-CM | POA: Diagnosis not present

## 2023-06-19 DIAGNOSIS — E039 Hypothyroidism, unspecified: Secondary | ICD-10-CM | POA: Diagnosis not present

## 2023-06-19 NOTE — Addendum Note (Signed)
Encounter addended by: Chinita Pester, CMA on: 06/19/2023 4:07 PM  Actions taken: Clinical Note Signed

## 2023-06-19 NOTE — Patient Instructions (Addendum)
No Labs done today.   PLEASE CALL us TO CONFIRM YOUR CORRECT ENTRESTO DOSAGE.   DECREASE Entresto to 49-51mg  (1 tablet) by mouth 2 times daily.  No other medication changes were made. Please continue all current medications as prescribed.  Your physician recommends that you schedule a follow-up appointment in: 4 months with Dr. Shirlee Latch. Please contact our office in January 2025 to schedule a March 2025 appointment.   If you have any questions or concerns before your next appointment please send [place] a message through [place] or call our office at [phone].    TO LEAVE A MESSAGE FOR THE NURSE SELECT OPTION 2, PLEASE LEAVE A MESSAGE INCLUDING:  YOUR NAME  DATE OF BIRTH  CALL BACK NUMBER  REASON FOR CALL**this is important as we prioritize the call backs  YOU WILL RECEIVE A CALL BACK THE SAME DAY AS LONG AS YOU CALL BEFORE 4:00 PM   Do the following things EVERYDAY: 1) Weigh yourself in the morning before breakfast. Write it down and keep it in a log. 2) Take your medicines as prescribed 3) Eat low salt foods--Limit salt (sodium) to 2000 mg per day.  4) Stay as active as you can everyday 5) Limit all fluids for the day to less than 2 liters   At the [facility], you and your health needs are our priority. As part of our continuing mission to provide you with exceptional heart care, we have created designated Provider Care Teams. These Care Teams include your primary Cardiologist (physician) and Advanced Practice Providers (APPs- Physician Assistants and Nurse Practitioners) who all work together to provide you with the care you need, when you need it.   You may see any of the following providers on your designated Care Team at your next follow up:  Dr Arvilla Meres  Dr Marca Ancona  Dr. Marcos Eke, NP  Robbie Lis, [place]  [facility]  [place], [place]  Brynda Peon, NP  Karle Plumber, [name]   Please be sure to bring in all your  medications bottles to every appointment.    Thank you for choosing [facility]

## 2023-06-20 DIAGNOSIS — N183 Chronic kidney disease, stage 3 unspecified: Secondary | ICD-10-CM | POA: Diagnosis not present

## 2023-06-20 DIAGNOSIS — Z96641 Presence of right artificial hip joint: Secondary | ICD-10-CM | POA: Diagnosis not present

## 2023-06-20 DIAGNOSIS — Z7984 Long term (current) use of oral hypoglycemic drugs: Secondary | ICD-10-CM | POA: Diagnosis not present

## 2023-06-20 DIAGNOSIS — Z79899 Other long term (current) drug therapy: Secondary | ICD-10-CM | POA: Diagnosis not present

## 2023-06-20 DIAGNOSIS — I5020 Unspecified systolic (congestive) heart failure: Secondary | ICD-10-CM | POA: Diagnosis not present

## 2023-06-20 DIAGNOSIS — I10 Essential (primary) hypertension: Secondary | ICD-10-CM | POA: Diagnosis not present

## 2023-06-20 DIAGNOSIS — Z95810 Presence of automatic (implantable) cardiac defibrillator: Secondary | ICD-10-CM | POA: Diagnosis not present

## 2023-06-20 DIAGNOSIS — E039 Hypothyroidism, unspecified: Secondary | ICD-10-CM | POA: Diagnosis not present

## 2023-06-20 DIAGNOSIS — E1122 Type 2 diabetes mellitus with diabetic chronic kidney disease: Secondary | ICD-10-CM | POA: Diagnosis not present

## 2023-06-20 DIAGNOSIS — I129 Hypertensive chronic kidney disease with stage 1 through stage 4 chronic kidney disease, or unspecified chronic kidney disease: Secondary | ICD-10-CM | POA: Diagnosis not present

## 2023-06-21 ENCOUNTER — Ambulatory Visit (INDEPENDENT_AMBULATORY_CARE_PROVIDER_SITE_OTHER): Payer: Medicare Other | Admitting: Family

## 2023-06-21 ENCOUNTER — Other Ambulatory Visit (INDEPENDENT_AMBULATORY_CARE_PROVIDER_SITE_OTHER): Payer: Medicare Other

## 2023-06-21 DIAGNOSIS — M87051 Idiopathic aseptic necrosis of right femur: Secondary | ICD-10-CM | POA: Diagnosis not present

## 2023-06-21 DIAGNOSIS — Z7409 Other reduced mobility: Secondary | ICD-10-CM | POA: Diagnosis not present

## 2023-06-21 DIAGNOSIS — M5416 Radiculopathy, lumbar region: Secondary | ICD-10-CM

## 2023-06-21 DIAGNOSIS — M722 Plantar fascial fibromatosis: Secondary | ICD-10-CM | POA: Diagnosis not present

## 2023-06-21 DIAGNOSIS — M25651 Stiffness of right hip, not elsewhere classified: Secondary | ICD-10-CM | POA: Diagnosis not present

## 2023-06-21 DIAGNOSIS — Z96641 Presence of right artificial hip joint: Secondary | ICD-10-CM | POA: Diagnosis not present

## 2023-06-21 DIAGNOSIS — R29898 Other symptoms and signs involving the musculoskeletal system: Secondary | ICD-10-CM | POA: Diagnosis not present

## 2023-06-21 MED ORDER — PREDNISONE 50 MG PO TABS
ORAL_TABLET | ORAL | 0 refills | Status: DC
Start: 2023-06-21 — End: 2023-09-29

## 2023-06-21 NOTE — Progress Notes (Signed)
Office Visit Note   Patient: Melinda Mckinney           Date of Birth: 1956/05/29           MRN: [id number] Visit Date: 06/21/2023              Requested by: Jeralyn Bennett, MD [address] [place],  [place] [address] PCP: Jeralyn Bennett, MD  Chief Complaint  Patient presents with   Left Foot - Pain      HPI: The patient is a 67 year old woman who presents today for 2 separate issues.  She reports that both of these problems began immediately after she had total hip arthroplasty on the right earlier this year has been ongoing for many months without any improvement.  #1 heel pain she has been having point tenderness to the plantar aspect of the right heel.  She has worse pain with start up pain with weightbearing this does not ease with prolonged weightbearing.  She unfortunately is unable to use anti-inflammatories.  She has tried stretching exercises at home without improvement  #2 she is having shooting pain in the posterior right lower extremity which also began just after her total hip arthroplasty.  She is having numbness and burning over the dorsum of the same foot she has had some giving way of the right lower extremity as well  No red flag symptoms  Assessment & Plan: Visit Diagnoses:  1. Lumbar radiculopathy     Plan: Depo-Medrol injection right heel.  Patient tolerated well.  Will also trial her on a prednisone burst for suspected lumbar radiculopathy right sided.  Will follow-up by phone in 1 week.  Follow-Up Instructions: No follow-ups on file.   Back Exam   Tenderness  The patient is experiencing no tenderness.   Muscle Strength  The patient has normal back strength.  Tests  Straight leg raise right: positive Straight leg raise left: negative      Patient is alert, oriented, no adenopathy, well-dressed, normal affect, normal respiratory effort. On examination right foot she has point tenderness to the origin of the plantar fascia on the right  there is no pain with lateral compression of calcaneus she does have some heel cord tightness.  Imaging: No results found. No images are attached to the encounter.  Labs: Lab Results  Component Value Date   HGBA1C 6.1 10/11/2022   HGBA1C 6.4 04/28/2022   HGBA1C 6.3 10/26/2021   ESRSEDRATE 38 (H) 06/18/2019   ESRSEDRATE 12 01/06/2017   ESRSEDRATE 33 (H) 04/16/2014   CRP <0.8 06/18/2019   CRP 1.0 (H) 05/28/2019   CRP 2.3 (H) 05/27/2019   LABURIC 7.3 (H) 04/28/2022   REPTSTATUS 06/13/2019 FINAL 06/11/2019   CULT 10,000 COLONIES/mL YEAST (A) 06/11/2019   LABORGA NO GROWTH 07/24/2014     Lab Results  Component Value Date   ALBUMIN 4.3 09/16/2022   ALBUMIN 4.0 05/09/2022   ALBUMIN 4.0 11/04/2021    Lab Results  Component Value Date   MG 3.2 (H) 06/22/2019   MG 1.9 06/21/2019   MG 1.9 06/20/2019   Lab Results  Component Value Date   VD25OH 49.21 10/19/2022   VD25OH 58.09 08/26/2021   VD25OH 66.05 08/25/2020    No results found for: "PREALBUMIN"    Latest Ref Rng & Units 09/16/2022   12:00 AM 05/09/2022   12:00 AM 04/28/2022   11:00 AM  CBC EXTENDED  WBC 4.0 - 10.5 K/uL   6.2   RBC 3.87 - 5.11  Mil/uL   3.78   Hemoglobin 12.0 - 16.0 11.3     10.4     11.7   HCT 36.0 - 46.0 %   35.8   Platelets 150.0 - 400.0 K/uL   295.0   NEUT# 1.4 - 7.7 K/uL   4.1   Lymph# 0.7 - 4.0 K/uL   1.5      This result is from an external source.     There is no height or weight on file to calculate BMI.  Orders:  Orders Placed This Encounter  Procedures   XR Lumbar Spine 2-3 Views   No orders of the defined types were placed in this encounter.    Procedures: Foot Inj: right plantar fascia  Date/Time: 06/21/2023 10:32 AM  Performed by: Adonis Huguenin, NP Authorized by: Adonis Huguenin, NP   Consent Given by:  Patient Site marked: the procedure site was marked   Timeout: prior to procedure the correct patient, procedure, and site was verified   Indications:  Fasciitis and  pain Condition: Plantar Fasciitis   Location: right plantar fascia muscle   Prep: patient was prepped and draped in usual sterile fashion   Needle Size:  22 G Medications:  2 mL lidocaine 1 %; 40 mg methylPREDNISolone acetate 40 MG/ML Patient Tolerance:  Patient tolerated the procedure well with no immediate complications   Clinical Data: No additional findings.  ROS:  All other systems negative, except as noted in the HPI. Review of Systems  Objective: Vital Signs: There were no vitals taken for this visit.  Specialty Comments:  No specialty comments available.  PMFS History: Patient Active Problem List   Diagnosis Date Noted   Complete heart block (HCC) 02/05/2020   Pacemaker 02/05/2020   Permanent atrial fibrillation (HCC) 02/05/2020   Non-ischemic cardiomyopathy (HCC) 07/10/2019   S/P ICD (internal cardiac defibrillator) procedure    Pneumonia due to COVID-19 virus 05/25/2019   Encounter for monitoring sotalol therapy 08/03/2017   Acute systolic (congestive) heart failure (HCC) 07/27/2017   Atrial fibrillation with RVR (HCC) 06/29/2017   PCP NOTES >>>> 06/16/2015   Back pain 02/05/2015   Hypothyroidism 01/19/2015   Vitamin D deficiency    Vulvar atrophy 08/20/2014   Annual physical exam 07/24/2014   Anemia 07/24/2014   DM2 (diabetes mellitus, type 2) (HCC) 09/27/2013   Insomnia 09/27/2013   Intertrigo 09/27/2013   Urolithiasis 09/27/2013   Essential hypertension    GERD, h/o achalasia s/p heller ~2009)    Hyperlipidemia LDL goal <70    CAD- H/O prior PCI    Osteoporosis    Fibromyalgia    Past Medical History:  Diagnosis Date   Anemia    Anxiety and depression    Asthma    Borderline diabetes    CAD (coronary artery disease) ~2009   stent    Cervical cancer (HCC)    "stage 1; had cryotherapy"   CHF (congestive heart failure) (HCC)    Chronic lower back pain    Complete heart block (HCC) 06/2019   s/p AV nodal ablation and CRT-P by Dr Ladona Ridgel   DDD  (degenerative disc disease), lumbar    Dr. Ethelene Hal, recommends lumbar epidural steroid injections L5-S1 to the right   Depression    Fibromyalgia    GERD (gastroesophageal reflux disease)    High cholesterol    History of hiatal hernia    History of kidney stones    Hypertension    Hypothyroidism    Osteoporosis  Permanent atrial fibrillation (HCC) 07/2017   s/p AV nodal ablation   Thyroid disease     Family History  Problem Relation Age of Onset   Breast cancer Other        aunt    CAD Brother    Lung cancer Mother        F and M   Diabetes Father        F and mother    CAD Father    Lung cancer Father    Stroke Sister    Colon cancer Neg Hx     Past Surgical History:  Procedure Laterality Date   AV NODE ABLATION N/A 06/24/2019   Procedure: AV NODE ABLATION;  Surgeon: Marinus Maw, MD;  Location: [facility] INVASIVE CV LAB;  Service: Cardiovascular;  Laterality: N/A;   BIV PACEMAKER INSERTION CRT-P N/A 06/24/2019   Procedure: BIV PACEMAKER INSERTION CRT-P;  Surgeon: Marinus Maw, MD;  Location: [facility] INVASIVE CV LAB;  Service: Cardiovascular;  Laterality: N/A;   CARDIAC CATHETERIZATION  ~ 2008   CARDIOVERSION N/A 08/05/2017   Procedure: CARDIOVERSION;  Surgeon: Marinus Maw, MD;  Location: [facility] OR;  Service: Cardiovascular;  Laterality: N/A;   CARDIOVERSION N/A 08/24/2017   Procedure: CARDIOVERSION;  Surgeon: Thurmon Fair, MD;  Location: [facility] ENDOSCOPY;  Service: Cardiovascular;  Laterality: N/A;   CARDIOVERSION N/A 06/19/2019   Procedure: CARDIOVERSION;  Surgeon: Laurey Morale, MD;  Location: [facility] OR;  Service: Cardiovascular;  Laterality: N/A;   CARDIOVERSION N/A 06/21/2019   Procedure: CARDIOVERSION;  Surgeon: Laurey Morale, MD;  Location: [facility] ENDOSCOPY;  Service: Cardiovascular;  Laterality: N/A;   CESAREAN SECTION  1975; ~ 1978/1979; 1984   ESOPHAGOMYOTOMY  2009   GYNECOLOGIC CRYOSURGERY  X 2   "stage 1 cancer"   HERNIA REPAIR  X 6   "all in my stomach" (07/27/2017)    KNEE ARTHROSCOPY Right    NEPHRECTOMY Right 1991   in [place], due to fibrosis and lithiasis   RIGHT/LEFT HEART CATH AND CORONARY ANGIOGRAPHY N/A 06/17/2019   Procedure: RIGHT/LEFT HEART CATH AND CORONARY ANGIOGRAPHY;  Surgeon: Runell Gess, MD;  Location: [facility] INVASIVE CV LAB;  Service: Cardiovascular;  Laterality: N/A;   TEE WITHOUT CARDIOVERSION N/A 06/19/2019   Procedure: TRANSESOPHAGEAL ECHOCARDIOGRAM (TEE);  Surgeon: Laurey Morale, MD;  Location: [facility] OR;  Service: Cardiovascular;  Laterality: N/A;   TUBAL LIGATION     Social History   Occupational History   Occupation: stay home   Tobacco Use   Smoking status: Former    Current packs/day: 0.00    Average packs/day: 2.0 packs/day for 28.0 years (56.0 ttl pk-yrs)    Types: Cigarettes    Start date: 31    Quit date: 2000    Years since quitting: 24.8   Smokeless tobacco: Never  Vaping Use   Vaping status: Never Used  Substance and Sexual Activity   Alcohol use: Yes    Alcohol/week: 5.0 standard drinks of alcohol    Types: 5 Cans of beer per week    Comment: 07/27/2017 "3-4 drinks//month"   Drug use: No   Sexual activity: Not Currently    Comment: 1ST INTERCOURSE- 16, PARTNERS - 2

## 2023-06-23 ENCOUNTER — Encounter: Payer: Self-pay | Admitting: Family

## 2023-06-23 MED ORDER — METHYLPREDNISOLONE ACETATE 40 MG/ML IJ SUSP
40.0000 mg | INTRAMUSCULAR | Status: AC | PRN
Start: 2023-06-21 — End: 2023-06-21
  Administered 2023-06-21: 40 mg

## 2023-06-23 MED ORDER — LIDOCAINE HCL 1 % IJ SOLN
2.0000 mL | INTRAMUSCULAR | Status: AC | PRN
Start: 2023-06-21 — End: 2023-06-21
  Administered 2023-06-21: 2 mL

## 2023-06-26 ENCOUNTER — Ambulatory Visit: Payer: Medicare Other | Attending: Internal Medicine

## 2023-06-26 DIAGNOSIS — M87051 Idiopathic aseptic necrosis of right femur: Secondary | ICD-10-CM | POA: Diagnosis not present

## 2023-06-26 DIAGNOSIS — I5022 Chronic systolic (congestive) heart failure: Secondary | ICD-10-CM

## 2023-06-26 DIAGNOSIS — R29898 Other symptoms and signs involving the musculoskeletal system: Secondary | ICD-10-CM | POA: Diagnosis not present

## 2023-06-26 DIAGNOSIS — Z7409 Other reduced mobility: Secondary | ICD-10-CM | POA: Diagnosis not present

## 2023-06-26 DIAGNOSIS — M25651 Stiffness of right hip, not elsewhere classified: Secondary | ICD-10-CM | POA: Diagnosis not present

## 2023-06-26 DIAGNOSIS — Z96641 Presence of right artificial hip joint: Secondary | ICD-10-CM | POA: Diagnosis not present

## 2023-06-26 DIAGNOSIS — Z950 Presence of cardiac pacemaker: Secondary | ICD-10-CM

## 2023-06-28 NOTE — Progress Notes (Signed)
  Received: Today Melinda Mckinney, Melinda Malta, FNP  Rishith Siddoway, Josephine Igo, RN Appears on the dry side. If she is feeling OK, no med changes. But if she is light-headed, ok to change Torsemide to 40/20 daily alternating with just 40 mg daily every other day.

## 2023-06-28 NOTE — Progress Notes (Signed)
EPIC Encounter for ICM Monitoring  Patient Name: Melinda Mckinney is a 67 y.o. female Date: 06/28/2023 Primary Care Physican: Jeralyn Bennett, MD Primary Cardiologist: Shirlee Latch Electrophysiologist: Rae Roam Pacing: 99.9%         02/08/2022 Weight: 214 lbs 10/05/2022 Weight: 209 lbs 01/16/2023 Weight: 215 lbs  06/19/2023 Office Weight: 220 lbs   Time in AT/AF  24.0 hr/day (100.0%)             Transmission reviewed.   Rechecked fluid levels per HF clinic after 11/4 OV.     Diet:  Does not follow low salt diet.     Optivol thoracic impedance suggesting possible dryness since 11/7.  Was suggesting possible fluid accumulation from 10/19-11/4 (addressed at 11/4 OV).   Prescribed:  Torsemide 20 mg take 2 tablets (40 mg total) by mouth every morning and 1 tablet (20 mg total) every afternoon. Potassium 20 mEq take 1 tablet daily  Spironolactone 25 mg take 0.5 tablet (12.5 mg total) by mouth at bedrime   Labs: 05/05/2023 Creatinine 1.78, BUN 33, Potassium 4.74, Sodium 138, GFR 31 03/31/2023 Creatinine 1.94, GFR 28  03/28/2023 Creatinine 1.67, BUN 36, Potassium 4.5, Sodium 136, GFR 33  03/20/2023 Creatinine 1.93, BUN 33, Potassium 3.2, Sodium 137, GFR 28  03/01/2023 Creatinine 1.68, BUN 24, Potassium 3.7, Sodium 139, GFR 33 01/24/2023 Creatinine 1.54, BUN 22, Potassium 3.6, Sodium 139, GFR 36 02/09/2022 Creatinine 1.87, BUN 49, Potassium 4.6, Sodium 139, GFR 29 11/04/2021 Creatinine 1.5,   BUN 27, Potassium 5.2, Sodium 142 10/26/2021 Creatinine 1.62, BUN 31, Potassium 4.7, Sodium 140  A complete set of results can be found in Results Review.   Recommendations:  Copy sent to Prince Rome, NP per request following 11/4 OV.     Follow-up plan: ICM clinic phone appointment on 07/24/2023.   91 day device clinic remote transmission 07/03/2023.     EP/Cardiology Office Visits:  Recall 10/17/2023 with Dr Shirlee Latch.    Recall 06/08/2023 with Dr Nelly Laurence.       Copy of ICM check sent to Dr. Ladona Ridgel.      3 month ICM trend: 06/26/2023.    12-14 Month ICM trend:     Karie Soda, RN 06/28/2023 3:49 PM

## 2023-06-28 NOTE — Progress Notes (Signed)
Spoke with husband and heart failure questions reviewed.  Transmission results reviewed.  Pt asymptomatic for dehydration such as dizziness or lightheadedness.  She is feeling fine.    She has not been drinking a lot fluid and advised to drink 64 oz daily to stay hydrated.  No changes to Torsemide since patient is feeling fine.

## 2023-06-29 DIAGNOSIS — Z96641 Presence of right artificial hip joint: Secondary | ICD-10-CM | POA: Diagnosis not present

## 2023-06-29 DIAGNOSIS — M25651 Stiffness of right hip, not elsewhere classified: Secondary | ICD-10-CM | POA: Diagnosis not present

## 2023-06-29 DIAGNOSIS — Z7409 Other reduced mobility: Secondary | ICD-10-CM | POA: Diagnosis not present

## 2023-06-29 DIAGNOSIS — R29898 Other symptoms and signs involving the musculoskeletal system: Secondary | ICD-10-CM | POA: Diagnosis not present

## 2023-06-29 DIAGNOSIS — M87051 Idiopathic aseptic necrosis of right femur: Secondary | ICD-10-CM | POA: Diagnosis not present

## 2023-07-03 ENCOUNTER — Ambulatory Visit (INDEPENDENT_AMBULATORY_CARE_PROVIDER_SITE_OTHER): Payer: Medicare Other

## 2023-07-03 DIAGNOSIS — M6281 Muscle weakness (generalized): Secondary | ICD-10-CM | POA: Diagnosis not present

## 2023-07-03 DIAGNOSIS — Z7409 Other reduced mobility: Secondary | ICD-10-CM | POA: Diagnosis not present

## 2023-07-03 DIAGNOSIS — M87051 Idiopathic aseptic necrosis of right femur: Secondary | ICD-10-CM | POA: Diagnosis not present

## 2023-07-03 DIAGNOSIS — I4821 Permanent atrial fibrillation: Secondary | ICD-10-CM

## 2023-07-03 DIAGNOSIS — I428 Other cardiomyopathies: Secondary | ICD-10-CM | POA: Diagnosis not present

## 2023-07-03 DIAGNOSIS — M25651 Stiffness of right hip, not elsewhere classified: Secondary | ICD-10-CM | POA: Diagnosis not present

## 2023-07-03 DIAGNOSIS — Z96641 Presence of right artificial hip joint: Secondary | ICD-10-CM | POA: Diagnosis not present

## 2023-07-03 DIAGNOSIS — R29898 Other symptoms and signs involving the musculoskeletal system: Secondary | ICD-10-CM | POA: Diagnosis not present

## 2023-07-04 LAB — CUP PACEART REMOTE DEVICE CHECK
Battery Remaining Longevity: 49 mo
Battery Voltage: 2.94 V
Brady Statistic RA Percent Paced: 0 %
Brady Statistic RV Percent Paced: 99.8 %
Date Time Interrogation Session: 20241117204025
Implantable Lead Connection Status: 753985
Implantable Lead Connection Status: 753985
Implantable Lead Connection Status: 753985
Implantable Lead Implant Date: 20201109
Implantable Lead Implant Date: 20201109
Implantable Lead Implant Date: 20201109
Implantable Lead Location: 753858
Implantable Lead Location: 753859
Implantable Lead Location: 753860
Implantable Lead Model: 4598
Implantable Lead Model: 5076
Implantable Lead Model: 5076
Implantable Pulse Generator Implant Date: 20201109
Lead Channel Impedance Value: 1045 Ohm
Lead Channel Impedance Value: 1064 Ohm
Lead Channel Impedance Value: 1292 Ohm
Lead Channel Impedance Value: 1482 Ohm
Lead Channel Impedance Value: 1520 Ohm
Lead Channel Impedance Value: 399 Ohm
Lead Channel Impedance Value: 418 Ohm
Lead Channel Impedance Value: 418 Ohm
Lead Channel Impedance Value: 475 Ohm
Lead Channel Impedance Value: 570 Ohm
Lead Channel Impedance Value: 703 Ohm
Lead Channel Impedance Value: 760 Ohm
Lead Channel Impedance Value: 874 Ohm
Lead Channel Impedance Value: 988 Ohm
Lead Channel Pacing Threshold Amplitude: 0.625 V
Lead Channel Pacing Threshold Amplitude: 1.5 V
Lead Channel Pacing Threshold Pulse Width: 0.4 ms
Lead Channel Pacing Threshold Pulse Width: 0.8 ms
Lead Channel Sensing Intrinsic Amplitude: 1 mV
Lead Channel Sensing Intrinsic Amplitude: 1 mV
Lead Channel Sensing Intrinsic Amplitude: 9.75 mV
Lead Channel Sensing Intrinsic Amplitude: 9.75 mV
Lead Channel Setting Pacing Amplitude: 2.25 V
Lead Channel Setting Pacing Amplitude: 2.5 V
Lead Channel Setting Pacing Pulse Width: 0.4 ms
Lead Channel Setting Pacing Pulse Width: 0.8 ms
Lead Channel Setting Sensing Sensitivity: 2 mV
Zone Setting Status: 755011
Zone Setting Status: 755011

## 2023-07-05 DIAGNOSIS — Z96641 Presence of right artificial hip joint: Secondary | ICD-10-CM | POA: Diagnosis not present

## 2023-07-05 DIAGNOSIS — R29898 Other symptoms and signs involving the musculoskeletal system: Secondary | ICD-10-CM | POA: Diagnosis not present

## 2023-07-05 DIAGNOSIS — M87051 Idiopathic aseptic necrosis of right femur: Secondary | ICD-10-CM | POA: Diagnosis not present

## 2023-07-05 DIAGNOSIS — Z7409 Other reduced mobility: Secondary | ICD-10-CM | POA: Diagnosis not present

## 2023-07-05 DIAGNOSIS — M25651 Stiffness of right hip, not elsewhere classified: Secondary | ICD-10-CM | POA: Diagnosis not present

## 2023-07-10 ENCOUNTER — Other Ambulatory Visit (HOSPITAL_COMMUNITY): Payer: Self-pay | Admitting: Internal Medicine

## 2023-07-10 DIAGNOSIS — M25651 Stiffness of right hip, not elsewhere classified: Secondary | ICD-10-CM | POA: Diagnosis not present

## 2023-07-10 DIAGNOSIS — Z96641 Presence of right artificial hip joint: Secondary | ICD-10-CM | POA: Diagnosis not present

## 2023-07-10 DIAGNOSIS — R29898 Other symptoms and signs involving the musculoskeletal system: Secondary | ICD-10-CM | POA: Diagnosis not present

## 2023-07-10 DIAGNOSIS — M87051 Idiopathic aseptic necrosis of right femur: Secondary | ICD-10-CM | POA: Diagnosis not present

## 2023-07-10 DIAGNOSIS — Z7409 Other reduced mobility: Secondary | ICD-10-CM | POA: Diagnosis not present

## 2023-07-20 ENCOUNTER — Encounter: Payer: Self-pay | Admitting: Family Medicine

## 2023-07-20 DIAGNOSIS — M87051 Idiopathic aseptic necrosis of right femur: Secondary | ICD-10-CM | POA: Diagnosis not present

## 2023-07-20 DIAGNOSIS — Z96641 Presence of right artificial hip joint: Secondary | ICD-10-CM | POA: Diagnosis not present

## 2023-07-20 DIAGNOSIS — M25651 Stiffness of right hip, not elsewhere classified: Secondary | ICD-10-CM | POA: Diagnosis not present

## 2023-07-20 DIAGNOSIS — R29898 Other symptoms and signs involving the musculoskeletal system: Secondary | ICD-10-CM | POA: Diagnosis not present

## 2023-07-20 DIAGNOSIS — Z7409 Other reduced mobility: Secondary | ICD-10-CM | POA: Diagnosis not present

## 2023-07-20 DIAGNOSIS — M6281 Muscle weakness (generalized): Secondary | ICD-10-CM | POA: Diagnosis not present

## 2023-07-24 ENCOUNTER — Ambulatory Visit: Payer: Medicare Other | Attending: Internal Medicine

## 2023-07-24 DIAGNOSIS — Z96641 Presence of right artificial hip joint: Secondary | ICD-10-CM | POA: Diagnosis not present

## 2023-07-24 DIAGNOSIS — Z7409 Other reduced mobility: Secondary | ICD-10-CM | POA: Diagnosis not present

## 2023-07-24 DIAGNOSIS — I5022 Chronic systolic (congestive) heart failure: Secondary | ICD-10-CM

## 2023-07-24 DIAGNOSIS — Z950 Presence of cardiac pacemaker: Secondary | ICD-10-CM | POA: Diagnosis not present

## 2023-07-24 DIAGNOSIS — R29898 Other symptoms and signs involving the musculoskeletal system: Secondary | ICD-10-CM | POA: Diagnosis not present

## 2023-07-24 DIAGNOSIS — M25651 Stiffness of right hip, not elsewhere classified: Secondary | ICD-10-CM | POA: Diagnosis not present

## 2023-07-24 DIAGNOSIS — M87051 Idiopathic aseptic necrosis of right femur: Secondary | ICD-10-CM | POA: Diagnosis not present

## 2023-07-24 DIAGNOSIS — M6281 Muscle weakness (generalized): Secondary | ICD-10-CM | POA: Diagnosis not present

## 2023-07-24 NOTE — Progress Notes (Unsigned)
EPIC Encounter for ICM Monitoring  Patient Name: Melinda Mckinney is a 67 y.o. female Date: 07/24/2023 Primary Care Physican: Jeralyn Bennett, MD Primary Cardiologist: Shirlee Latch Electrophysiologist: Rae Roam Pacing: 99.4%         02/08/2022 Weight: 214 lbs 10/05/2022 Weight: 209 lbs 01/16/2023 Weight: 215 lbs  06/19/2023 Office Weight: 220 lbs 07/24/2023 Weight: 217 lbs (previous was 203 lbs)   Time in AT/AF  24.0 hr/day (100.0%)             Spoke with husband and heart failure questions reviewed.  Transmission results reviewed.  Pt reports weight gain of 15 lbs over the last few weeks and legs are a little swollen.   Diet:  Does not follow low salt diet.     Optivol thoracic impedance suggesting possible fluid accumulation starting 11/18 and trending back toward baseline.   Prescribed:  Torsemide 20 mg take 2 tablets (40 mg total) by mouth every morning and 1 tablet (20 mg total) every afternoon. Potassium 20 mEq take 1 tablet daily  Spironolactone 25 mg take 0.5 tablet (12.5 mg total) by mouth at bedtime   Labs: 05/05/2023 Creatinine 1.78, BUN 33, Potassium 4.74, Sodium 138, GFR 31 03/31/2023 Creatinine 1.94, GFR 28  03/28/2023 Creatinine 1.67, BUN 36, Potassium 4.5, Sodium 136, GFR 33  03/20/2023 Creatinine 1.93, BUN 33, Potassium 3.2, Sodium 137, GFR 28  03/01/2023 Creatinine 1.68, BUN 24, Potassium 3.7, Sodium 139, GFR 33 01/24/2023 Creatinine 1.54, BUN 22, Potassium 3.6, Sodium 139, GFR 36 02/09/2022 Creatinine 1.87, BUN 49, Potassium 4.6, Sodium 139, GFR 29 11/04/2021 Creatinine 1.5,   BUN 27, Potassium 5.2, Sodium 142 10/26/2021 Creatinine 1.62, BUN 31, Potassium 4.7, Sodium 140  A complete set of results can be found in Results Review.   Recommendations:  Pt has only been taking Torsemide 20 mg 1 tablet bid.  Advised her prescription reads 2 tablets AM and 1 tablet PM.   Copy sent to Dr Shirlee Latch for review and recommendations.     Follow-up plan: ICM clinic phone appointment on  08/07/2023 to recheck fluid levels.   91 day device clinic remote transmission 10/01/2022.     EP/Cardiology Office Visits:  Recall 10/17/2023 with Dr Shirlee Latch.    Recall 06/08/2023 with Dr Nelly Laurence.       Copy of ICM check sent to Dr. Ladona Ridgel.     3 month ICM trend: 07/24/2023.    12-14 Month ICM trend:     Karie Soda, RN 07/24/2023 3:26 PM

## 2023-07-25 NOTE — Progress Notes (Signed)
  Received: Grayland Jack, Eliot Ford, MD  Lilian Fuhs, Josephine Igo, RN She needs to increase torsemide to 40 qam/20 qpm as ordered in the past.  BMET  10 days.

## 2023-07-25 NOTE — Progress Notes (Signed)
Spoke with husband and advised Dr Shirlee Latch confirmed pt should be taking Torsemide 40 mg in AM and 20 mg in PM as previously prescribed.  BMET scheduled for 12/19 at HF clinic.  Husband stated they have Torsemide bottle with different instructions and advised to call the pharmacy and have the current prescription on file filled with [facility].  Advised to call back if any problems with getting the prescription filled today.

## 2023-07-25 NOTE — Progress Notes (Signed)
Husband called back and stated the prescription patient has for Torsemide is correct but she had read the instructions wrong.  The current prescription bottle does have instructions to take Torsemide 2 tablets every morning and 1 tablet every afternoon.  He confirmed with [facility] the prescription is available for refill when needed.  Pt understands the prescription directions and will take as prescribed.

## 2023-07-26 NOTE — Progress Notes (Signed)
Remote pacemaker transmission.   

## 2023-07-27 DIAGNOSIS — Z7409 Other reduced mobility: Secondary | ICD-10-CM | POA: Diagnosis not present

## 2023-07-27 DIAGNOSIS — R29898 Other symptoms and signs involving the musculoskeletal system: Secondary | ICD-10-CM | POA: Diagnosis not present

## 2023-07-27 DIAGNOSIS — M25651 Stiffness of right hip, not elsewhere classified: Secondary | ICD-10-CM | POA: Diagnosis not present

## 2023-07-27 DIAGNOSIS — M87051 Idiopathic aseptic necrosis of right femur: Secondary | ICD-10-CM | POA: Diagnosis not present

## 2023-07-27 DIAGNOSIS — Z96641 Presence of right artificial hip joint: Secondary | ICD-10-CM | POA: Diagnosis not present

## 2023-08-01 DIAGNOSIS — M25561 Pain in right knee: Secondary | ICD-10-CM | POA: Diagnosis not present

## 2023-08-01 DIAGNOSIS — E1122 Type 2 diabetes mellitus with diabetic chronic kidney disease: Secondary | ICD-10-CM | POA: Diagnosis not present

## 2023-08-01 DIAGNOSIS — M1711 Unilateral primary osteoarthritis, right knee: Secondary | ICD-10-CM | POA: Diagnosis not present

## 2023-08-01 DIAGNOSIS — N184 Chronic kidney disease, stage 4 (severe): Secondary | ICD-10-CM | POA: Diagnosis not present

## 2023-08-02 ENCOUNTER — Ambulatory Visit (HOSPITAL_COMMUNITY)
Admission: RE | Admit: 2023-08-02 | Discharge: 2023-08-02 | Disposition: A | Discharge status: Home / Self Care | Payer: Medicare Other | Source: Ambulatory Visit | Attending: Cardiology | Admitting: Cardiology

## 2023-08-02 DIAGNOSIS — Z950 Presence of cardiac pacemaker: Secondary | ICD-10-CM

## 2023-08-02 DIAGNOSIS — I5022 Chronic systolic (congestive) heart failure: Secondary | ICD-10-CM

## 2023-08-02 LAB — BASIC METABOLIC PANEL
Anion gap: 16 — ABNORMAL HIGH (ref 5–15)
BUN: 31 mg/dL — ABNORMAL HIGH (ref 8–23)
CO2: 27 mmol/L (ref 22–32)
Calcium: 9.9 mg/dL (ref 8.9–10.3)
Chloride: 98 mmol/L (ref 98–111)
Creatinine, Ser: 1.82 mg/dL — ABNORMAL HIGH (ref 0.44–1.00)
GFR, Estimated: 30 mL/min — ABNORMAL LOW (ref >60)
Glucose, Bld: 133 mg/dL — ABNORMAL HIGH (ref 70–99)
Potassium: 3.6 mmol/L (ref 3.5–5.1)
Sodium: 141 mmol/L (ref 135–145)

## 2023-08-03 ENCOUNTER — Other Ambulatory Visit (HOSPITAL_COMMUNITY): Payer: Medicare Other

## 2023-08-07 ENCOUNTER — Ambulatory Visit: Payer: Medicare Other | Attending: Internal Medicine

## 2023-08-07 DIAGNOSIS — I5022 Chronic systolic (congestive) heart failure: Secondary | ICD-10-CM

## 2023-08-07 DIAGNOSIS — Z950 Presence of cardiac pacemaker: Secondary | ICD-10-CM

## 2023-08-08 NOTE — Progress Notes (Signed)
EPIC Encounter for ICM Monitoring  Patient Name: Melinda Mckinney is a 67 y.o. female Date: 08/08/2023 Primary Care Physican: Jeralyn Bennett, MD Primary Cardiologist: Shirlee Latch Electrophysiologist: Rae Roam Pacing: 99.4%         02/08/2022 Weight: 214 lbs 10/05/2022 Weight: 209 lbs 01/16/2023 Weight: 215 lbs  06/19/2023 Office Weight: 220 lbs 07/24/2023 Weight: 217 lbs  08/08/2023 Weight: 220 lbs   Time in AT/AF  24.0 hr/day (100.0%)             Spoke with husband and heart failure questions reviewed.  Transmission results reviewed.  Pt reports ankle/leg swelling has resolved.    Diet:  Does not follow low salt diet.     Optivol thoracic impedance suggesting fluid levels returned to normal since 12/13 after recommendation to take Torsemide as prescribed.    Prescribed:  Torsemide 20 mg take 2 tablets (40 mg total) by mouth every morning and 1 tablet (20 mg total) every afternoon. Potassium 20 mEq take 1 tablet daily  Spironolactone 25 mg take 0.5 tablet (12.5 mg total) by mouth at bedtime   Labs: 05/05/2023 Creatinine 1.78, BUN 33, Potassium 4.74, Sodium 138, GFR 31 03/31/2023 Creatinine 1.94, GFR 28  03/28/2023 Creatinine 1.67, BUN 36, Potassium 4.5, Sodium 136, GFR 33  03/20/2023 Creatinine 1.93, BUN 33, Potassium 3.2, Sodium 137, GFR 28  03/01/2023 Creatinine 1.68, BUN 24, Potassium 3.7, Sodium 139, GFR 33 01/24/2023 Creatinine 1.54, BUN 22, Potassium 3.6, Sodium 139, GFR 36 02/09/2022 Creatinine 1.87, BUN 49, Potassium 4.6, Sodium 139, GFR 29 11/04/2021 Creatinine 1.5,   BUN 27, Potassium 5.2, Sodium 142 10/26/2021 Creatinine 1.62, BUN 31, Potassium 4.7, Sodium 140  A complete set of results can be found in Results Review.   Recommendations: No changes and encouraged to call if experiencing any fluid symptoms.   Follow-up plan: ICM clinic phone appointment on 08/28/2023.   91 day device clinic remote transmission 10/01/2022.     EP/Cardiology Office Visits:  Recall 10/17/2023 with  Dr Shirlee Latch.    Recall 06/08/2023 with Dr Nelly Laurence.       Copy of ICM check sent to Dr. Ladona Ridgel.     3 month ICM trend: 08/07/2023.    12-14 Month ICM trend:     Karie Soda, RN 08/08/2023 10:04 AM

## 2023-08-10 DIAGNOSIS — R202 Paresthesia of skin: Secondary | ICD-10-CM | POA: Diagnosis not present

## 2023-08-10 DIAGNOSIS — R200 Anesthesia of skin: Secondary | ICD-10-CM | POA: Diagnosis not present

## 2023-08-10 DIAGNOSIS — M4726 Other spondylosis with radiculopathy, lumbar region: Secondary | ICD-10-CM | POA: Diagnosis not present

## 2023-08-10 DIAGNOSIS — M47817 Spondylosis without myelopathy or radiculopathy, lumbosacral region: Secondary | ICD-10-CM | POA: Diagnosis not present

## 2023-08-10 DIAGNOSIS — M19071 Primary osteoarthritis, right ankle and foot: Secondary | ICD-10-CM | POA: Diagnosis not present

## 2023-08-10 DIAGNOSIS — M48061 Spinal stenosis, lumbar region without neurogenic claudication: Secondary | ICD-10-CM | POA: Diagnosis not present

## 2023-08-28 ENCOUNTER — Other Ambulatory Visit (HOSPITAL_COMMUNITY): Payer: Self-pay | Admitting: Cardiology

## 2023-08-28 ENCOUNTER — Ambulatory Visit: Payer: Medicare Other | Attending: Internal Medicine

## 2023-08-28 DIAGNOSIS — I5022 Chronic systolic (congestive) heart failure: Secondary | ICD-10-CM | POA: Diagnosis not present

## 2023-08-28 DIAGNOSIS — Z950 Presence of cardiac pacemaker: Secondary | ICD-10-CM | POA: Diagnosis not present

## 2023-08-28 MED ORDER — CARVEDILOL 6.25 MG PO TABS: 6.2500 mg | ORAL_TABLET | Freq: Two times a day (BID) | ORAL | 3 refills | Status: AC

## 2023-08-29 NOTE — Progress Notes (Signed)
 Increase torsemide to 40 mg bid, get BMET 1 week.

## 2023-08-29 NOTE — Progress Notes (Signed)
 EPIC Encounter for ICM Monitoring  Patient Name: Melinda Mckinney is a 68 y.o. female Date: 08/29/2023 Primary Care Physican: Zamora, Ezequiel, MD Primary Cardiologist: Rolan Electrophysiologist: Waddell Pore Pacing: 99.4%         02/08/2022 Weight: 214 lbs 10/05/2022 Weight: 209 lbs 01/16/2023 Weight: 215 lbs  06/19/2023 Office Weight: 220 lbs 07/24/2023 Weight: 217 lbs  08/08/2023 Weight: 220 lbs 08/29/2023 Weight: Unknown, has not weighed in the last couple of weeks   Time in AT/AF  24.0 hr/day (100.0%)             Spoke with husband and heart failure questions reviewed.  Transmission results reviewed.  Husband reports Pt is asymptomatic for fluid accumulation.    Diet:  Does not follow low salt diet.     Optivol thoracic impedance suggesting possible fluid accumulation starting 12/22.    Prescribed:  Torsemide  20 mg take 2 tablets (40 mg total) by mouth every morning and 1 tablet (20 mg total) every afternoon.   Potassium 20 mEq take 1 tablet daily  Spironolactone  25 mg take 0.5 tablet (12.5 mg total) by mouth at bedtime   Labs: 05/05/2023 Creatinine 1.78, BUN 33, Potassium 4.74, Sodium 138, GFR 31 03/31/2023 Creatinine 1.94, GFR 28  03/28/2023 Creatinine 1.67, BUN 36, Potassium 4.5, Sodium 136, GFR 33  03/20/2023 Creatinine 1.93, BUN 33, Potassium 3.2, Sodium 137, GFR 28  03/01/2023 Creatinine 1.68, BUN 24, Potassium 3.7, Sodium 139, GFR 33 01/24/2023 Creatinine 1.54, BUN 22, Potassium 3.6, Sodium 139, GFR 36 02/09/2022 Creatinine 1.87, BUN 49, Potassium 4.6, Sodium 139, GFR 29 11/04/2021 Creatinine 1.5,   BUN 27, Potassium 5.2, Sodium 142 10/26/2021 Creatinine 1.62, BUN 31, Potassium 4.7, Sodium 140  A complete set of results can be found in Results Review.   Recommendations:  Confirmed taking Torsemide  40 mg AM/20 mg PM.  Copy sent to Dr Rolan for review and recommendations if needed.     Follow-up plan: ICM clinic phone appointment on 09/05/2023 to recheck fluid levels.   91  day device clinic remote transmission 10/01/2022.     EP/Cardiology Office Visits:  Recall 10/17/2023 with Dr Rolan.    Recall 06/08/2023 with Dr Nancey.       Copy of ICM check sent to Dr. Waddell.     3 month ICM trend: 08/28/2023.    12-14 Month ICM trend:     [name], RN 08/29/2023 12:14 PM

## 2023-08-30 MED ORDER — TORSEMIDE 20 MG PO TABS
40.0000 mg | ORAL_TABLET | Freq: Two times a day (BID) | ORAL | 2 refills | Status: DC
Start: 2023-08-30 — End: 2023-09-06

## 2023-08-30 NOTE — Progress Notes (Signed)
 Spoke with husband per DPR and advised Dr Rolan recommended to increase Torsemide  20 mg to 2 tablets (40 mg total) by mouth twice a day.  BMET scheduled 1/22 at HF clinic.  He verbalized understanding and call if any questions.   He does not need a Torsemide  refill at this time.  Epic script updated and BMET order placed.

## 2023-09-05 ENCOUNTER — Ambulatory Visit: Payer: Medicare Other | Attending: Internal Medicine

## 2023-09-05 DIAGNOSIS — Z950 Presence of cardiac pacemaker: Secondary | ICD-10-CM

## 2023-09-05 DIAGNOSIS — I5022 Chronic systolic (congestive) heart failure: Secondary | ICD-10-CM

## 2023-09-06 ENCOUNTER — Ambulatory Visit (HOSPITAL_COMMUNITY)
Admission: RE | Admit: 2023-09-06 | Discharge: 2023-09-06 | Disposition: A | Discharge status: Home / Self Care | Payer: Medicare Other | Source: Ambulatory Visit | Attending: Cardiology | Admitting: Cardiology

## 2023-09-06 ENCOUNTER — Telehealth: Payer: Self-pay

## 2023-09-06 DIAGNOSIS — I5022 Chronic systolic (congestive) heart failure: Secondary | ICD-10-CM | POA: Insufficient documentation

## 2023-09-06 LAB — BASIC METABOLIC PANEL
Anion gap: 12 (ref 5–15)
BUN: 32 mg/dL — ABNORMAL HIGH (ref 8–23)
CO2: 26 mmol/L (ref 22–32)
Calcium: 9.9 mg/dL (ref 8.9–10.3)
Chloride: 102 mmol/L (ref 98–111)
Creatinine, Ser: 1.66 mg/dL — ABNORMAL HIGH (ref 0.44–1.00)
GFR, Estimated: 33 mL/min — ABNORMAL LOW (ref >60)
Glucose, Bld: 112 mg/dL — ABNORMAL HIGH (ref 70–99)
Potassium: 4.3 mmol/L (ref 3.5–5.1)
Sodium: 140 mmol/L (ref 135–145)

## 2023-09-06 MED ORDER — TORSEMIDE 20 MG PO TABS: ORAL_TABLET | ORAL | 2 refills | Status: DC

## 2023-09-06 NOTE — Progress Notes (Signed)
Spoke with husband and advised Dr Shirlee Latch recommended to take Torsemide 20 mg 3 tablets (60 mg total) every morning and 2 tablets (40 mg total) every evening.  Advised HF clinic will call to schedule an appt.   No Torsemide refill needed at this time.  He verbalized understanding.

## 2023-09-06 NOTE — Telephone Encounter (Signed)
Remote ICM transmission received.  Attempted call to husband regarding ICM remote transmission and no answer.   

## 2023-09-06 NOTE — Progress Notes (Signed)
EPIC Encounter for ICM Monitoring  Patient Name: Melinda Mckinney is a 67 y.o. female Date: 09/06/2023 Primary Care Physican: Jeralyn Bennett, MD Primary Cardiologist: Shirlee Latch Electrophysiologist: Rae Roam Pacing: 100%         02/08/2022 Weight: 214 lbs 10/05/2022 Weight: 209 lbs 01/16/2023 Weight: 215 lbs  06/19/2023 Office Weight: 220 lbs 07/24/2023 Weight: 217 lbs  08/08/2023 Weight: 220 lbs 08/29/2023 Weight: Unknown, has not weighed in the last couple of weeks   Time in AT/AF  24.0 hr/day (100.0%)            Spoke with husband and heart failure questions reviewed.  Transmission results reviewed.  Pt is asymptomatic for fluid accumulation.     Diet:  Does not follow low salt diet.     Optivol thoracic impedance suggesting possible fluid accumulation starting 12/22 with improvement after Torsemide increased to 40 mg bid on 1/15.  Fluid index greater than normal threshold starting 08/18/2023   Prescribed:  Torsemide 20 mg take 2 tablets (40 mg total) by mouth twice a day.  (Increased 08/30/2023)  Confirmed 1/22 she is taking 40 mg bid Potassium 20 mEq take 1 tablet daily  Spironolactone 25 mg take 0.5 tablet (12.5 mg total) by mouth at bedtime   Labs: 09/06/2023 Scheduled BMET at HF clinic at 2:15 PM 08/02/2023 Creatinine 1.82, BUN 31, Potassium 3.6, Sodium 141, GFR 30 08/01/2023 Creatinine 1.65, BUN 32, Potassium 4.5, Sodium 139, GFR 33 05/05/2023 Creatinine 1.78, BUN 33, Potassium 4.7, Sodium 138, GFR 31 03/31/2023 Creatinine 1.94, GFR 28  03/28/2023 Creatinine 1.67, BUN 36, Potassium 4.5, Sodium 136, GFR 33  03/20/2023 Creatinine 1.93, BUN 33, Potassium 3.2, Sodium 137, GFR 28  03/01/2023 Creatinine 1.68, BUN 24, Potassium 3.7, Sodium 139, GFR 33 01/24/2023 Creatinine 1.54, BUN 22, Potassium 3.6, Sodium 139, GFR 36 02/09/2022 Creatinine 1.87, BUN 49, Potassium 4.6, Sodium 139, GFR 29 11/04/2021 Creatinine 1.5,   BUN 27, Potassium 5.2, Sodium 142 10/26/2021 Creatinine 1.62, BUN 31,  Potassium 4.7, Sodium 140  A complete set of results can be found in Results Review.   Recommendations:  Copy sent to Dr Shirlee Latch for review and recommendations if needed.     Follow-up plan: ICM clinic phone appointment on 09/12/2023 to recheck fluid levels.   91 day device clinic remote transmission 10/01/2022.     EP/Cardiology Office Visits:  Recall 10/17/2023 with Dr Shirlee Latch.    Recall 06/08/2023 with Dr Nelly Laurence.       Copy of ICM check sent to Dr. Ladona Ridgel.     3 month ICM trend: 09/05/2023.    12-14 Month ICM trend:     Karie Soda, RN 09/06/2023 8:02 AM

## 2023-09-06 NOTE — Progress Notes (Signed)
Increase torsemide to 60 qam/40 qpm and arrange followup in CHF clinic.

## 2023-09-12 ENCOUNTER — Telehealth: Payer: Self-pay

## 2023-09-12 ENCOUNTER — Ambulatory Visit: Payer: Medicare Other | Attending: Internal Medicine

## 2023-09-12 DIAGNOSIS — Z950 Presence of cardiac pacemaker: Secondary | ICD-10-CM

## 2023-09-12 DIAGNOSIS — I5022 Chronic systolic (congestive) heart failure: Secondary | ICD-10-CM

## 2023-09-12 NOTE — Telephone Encounter (Signed)
Remote ICM transmission received.  Attempted call to patient regarding ICM remote transmission and no answer.

## 2023-09-12 NOTE — Progress Notes (Unsigned)
EPIC Encounter for ICM Monitoring  Patient Name: Melinda Mckinney is a 68 y.o. female Date: 09/12/2023 Primary Care Physican: Jeralyn Bennett, MD Primary Cardiologist: Shirlee Latch Electrophysiologist: Rae Roam Pacing: 100%         02/08/2022 Weight: 214 lbs 10/05/2022 Weight: 209 lbs 01/16/2023 Weight: 215 lbs  06/19/2023 Office Weight: 220 lbs 07/24/2023 Weight: 217 lbs  08/08/2023 Weight: 220 lbs 08/29/2023 Weight: Unknown, has not weighed in the last couple of weeks 09/12/2023 Weight: 219 lbs    Time in AT/AF  24.0 hr/day (100.0%)            Spoke with husband and heart failure questions reviewed.  Transmission results reviewed.  Pt is asymptomatic for fluid accumulation.  She has lost 2 lbs within the last week.    Diet:  Does not follow low salt diet.  Typically eats restaurant breakfast every morning.   Optivol thoracic impedance continues same pattern and suggesting possible fluid accumulation even after Torsemide increased to 60/40 on 1/22.  Fluid index greater than normal threshold starting 08/18/2023   Prescribed:  Torsemide 20 mg take 3 tablets (60 mg total) by mouth every morning and 2 tablets (40 mg total) every evening.  (Increased 09/06/2023)   Potassium 20 mEq take 1 tablet daily  Spironolactone 25 mg take 0.5 tablet (12.5 mg total) by mouth at bedtime   Labs: 09/06/2023 Creatinine 1.66, BUN 32, Potassium 4.3, Sodium 140, GFR 33 08/02/2023 Creatinine 1.82, BUN 31, Potassium 3.6, Sodium 141, GFR 30 08/01/2023 Creatinine 1.65, BUN 32, Potassium 4.5, Sodium 139, GFR 33 05/05/2023 Creatinine 1.78, BUN 33, Potassium 4.7, Sodium 138, GFR 31 03/31/2023 Creatinine 1.94, GFR 28  03/28/2023 Creatinine 1.67, BUN 36, Potassium 4.5, Sodium 136, GFR 33  03/20/2023 Creatinine 1.93, BUN 33, Potassium 3.2, Sodium 137, GFR 28  03/01/2023 Creatinine 1.68, BUN 24, Potassium 3.7, Sodium 139, GFR 33 01/24/2023 Creatinine 1.54, BUN 22, Potassium 3.6, Sodium 139, GFR 36 02/09/2022 Creatinine 1.87, BUN  49, Potassium 4.6, Sodium 139, GFR 29 11/04/2021 Creatinine 1.5,   BUN 27, Potassium 5.2, Sodium 142 10/26/2021 Creatinine 1.62, BUN 31, Potassium 4.7, Sodium 140  A complete set of results can be found in Results Review.   Recommendations:  Copy sent to Dr Shirlee Latch for review and recommendations if needed.   Message sent to HF clinic triage pool to contact patient for OV as requested by Dr Shirlee Latch on 1/22.   Follow-up plan: ICM clinic phone appointment on 09/18/2023 to recheck fluid levels.   91 day device clinic remote transmission 10/02/2023.     EP/Cardiology Office Visits:  Recall 10/17/2023 with Dr Shirlee Latch.    Recall 06/08/2023 with Dr Nelly Laurence.       Copy of ICM check sent to Dr. Ladona Ridgel.    3 month ICM trend: 09/12/2023.    12-14 Month ICM trend:     Karie Soda, RN 09/12/2023 2:30 PM

## 2023-09-12 NOTE — Progress Notes (Signed)
Can increase torsemide to 60 mg bid with BMET early next week and appointment in office.

## 2023-09-13 MED ORDER — TORSEMIDE 20 MG PO TABS: 60.0000 mg | ORAL_TABLET | Freq: Two times a day (BID) | ORAL | 2 refills | Status: DC

## 2023-09-13 NOTE — Progress Notes (Signed)
Spoke with husband and advised Dr Shirlee Latch increased Torsemide 20 mg to 3 tablets (60 mg total) twice a day. Also will need labs drawn next week appointment scheduled for 10:15 on 2/4 at HF clinic.    No Torsemide refill is needed at this time.  Patient has appointment scheduled for 2/14 at the HF clinic for follow up.   Escript update and preferred Pharmacy is [facility].  BMET ordered placed

## 2023-09-14 DIAGNOSIS — E1122 Type 2 diabetes mellitus with diabetic chronic kidney disease: Secondary | ICD-10-CM | POA: Diagnosis not present

## 2023-09-14 DIAGNOSIS — I129 Hypertensive chronic kidney disease with stage 1 through stage 4 chronic kidney disease, or unspecified chronic kidney disease: Secondary | ICD-10-CM | POA: Diagnosis not present

## 2023-09-14 DIAGNOSIS — D631 Anemia in chronic kidney disease: Secondary | ICD-10-CM | POA: Diagnosis not present

## 2023-09-14 DIAGNOSIS — I5020 Unspecified systolic (congestive) heart failure: Secondary | ICD-10-CM | POA: Diagnosis not present

## 2023-09-14 DIAGNOSIS — Z905 Acquired absence of kidney: Secondary | ICD-10-CM | POA: Diagnosis not present

## 2023-09-14 DIAGNOSIS — N1832 Chronic kidney disease, stage 3b: Secondary | ICD-10-CM | POA: Diagnosis not present

## 2023-09-18 ENCOUNTER — Ambulatory Visit: Payer: Medicare Other | Attending: Internal Medicine

## 2023-09-18 DIAGNOSIS — I5022 Chronic systolic (congestive) heart failure: Secondary | ICD-10-CM

## 2023-09-18 DIAGNOSIS — Z950 Presence of cardiac pacemaker: Secondary | ICD-10-CM

## 2023-09-19 ENCOUNTER — Ambulatory Visit (HOSPITAL_COMMUNITY)
Admission: RE | Admit: 2023-09-19 | Discharge: 2023-09-19 | Disposition: A | Discharge status: Home / Self Care | Payer: Medicare Other | Source: Ambulatory Visit | Attending: Internal Medicine | Admitting: Internal Medicine

## 2023-09-19 DIAGNOSIS — I5022 Chronic systolic (congestive) heart failure: Secondary | ICD-10-CM | POA: Diagnosis not present

## 2023-09-19 DIAGNOSIS — Z950 Presence of cardiac pacemaker: Secondary | ICD-10-CM | POA: Diagnosis not present

## 2023-09-19 LAB — BASIC METABOLIC PANEL
Anion gap: 13 (ref 5–15)
BUN: 33 mg/dL — ABNORMAL HIGH (ref 8–23)
CO2: 28 mmol/L (ref 22–32)
Calcium: 9.4 mg/dL (ref 8.9–10.3)
Chloride: 97 mmol/L — ABNORMAL LOW (ref 98–111)
Creatinine, Ser: 1.79 mg/dL — ABNORMAL HIGH (ref 0.44–1.00)
GFR, Estimated: 30 mL/min — ABNORMAL LOW (ref >60)
Glucose, Bld: 117 mg/dL — ABNORMAL HIGH (ref 70–99)
Potassium: 4.1 mmol/L (ref 3.5–5.1)
Sodium: 138 mmol/L (ref 135–145)

## 2023-09-20 NOTE — Progress Notes (Signed)
EPIC Encounter for ICM Monitoring  Patient Name: Melinda Mckinney is a 68 y.o. female Date: 09/20/2023 Primary Care Physican: Jeralyn Bennett, MD Primary Cardiologist: Shirlee Latch Electrophysiologist: Rae Roam Pacing: 100%         02/08/2022 Weight: 214 lbs 10/05/2022 Weight: 209 lbs 01/16/2023 Weight: 215 lbs  06/19/2023 Office Weight: 220 lbs 07/24/2023 Weight: 217 lbs  08/08/2023 Weight: 220 lbs 08/29/2023 Weight: Unknown, has not weighed in the last couple of weeks 09/12/2023 Weight: 219 lbs    Time in AT/AF  24.0 hr/day (100.0%)            Spoke with husband and heart failure questions reviewed.  Transmission results reviewed.  Pt is asymptomatic for fluid accumulation.  She has lost 2 lbs within the last week.    Diet:  Does not follow low salt diet.  Typically eats restaurant breakfast every morning.   Optivol thoracic impedance suggesting fluid levels returned to normal after Torsemide increased to 60 mg bid on 1/29.  Fluid index greater than normal threshold starting 08/18/2023   Prescribed:  Torsemide 20 mg take 3 tablets (60 mg total) by mouth twice a day.  Potassium 20 mEq take 1 tablet daily  Spironolactone 25 mg take 0.5 tablet (12.5 mg total) by mouth at bedtime   Labs: 09/19/2023 Creatinine 1.79, BUN 33, Potassium 4.1, Sodium 138, GFR 30 09/06/2023 Creatinine 1.66, BUN 32, Potassium 4.3, Sodium 140, GFR 33 08/02/2023 Creatinine 1.82, BUN 31, Potassium 3.6, Sodium 141, GFR 30 08/01/2023 Creatinine 1.65, BUN 32, Potassium 4.5, Sodium 139, GFR 33 05/05/2023 Creatinine 1.78, BUN 33, Potassium 4.7, Sodium 138, GFR 31 03/31/2023 Creatinine 1.94, GFR 28  03/28/2023 Creatinine 1.67, BUN 36, Potassium 4.5, Sodium 136, GFR 33  03/20/2023 Creatinine 1.93, BUN 33, Potassium 3.2, Sodium 137, GFR 28  03/01/2023 Creatinine 1.68, BUN 24, Potassium 3.7, Sodium 139, GFR 33 01/24/2023 Creatinine 1.54, BUN 22, Potassium 3.6, Sodium 139, GFR 36 02/09/2022 Creatinine 1.87, BUN 49, Potassium 4.6,  Sodium 139, GFR 29 11/04/2021 Creatinine 1.5,   BUN 27, Potassium 5.2, Sodium 142 10/26/2021 Creatinine 1.62, BUN 31, Potassium 4.7, Sodium 140  A complete set of results can be found in Results Review.   Recommendations:  No changes.   Follow-up plan: ICM clinic phone appointment on 10/09/2023.   91 day device clinic remote transmission 10/02/2023.     EP/Cardiology Office Visits:  Recall 10/17/2023 with Dr Shirlee Latch.    Recall 06/08/2023 with Dr Nelly Laurence.       Copy of ICM check sent to Dr. Ladona Ridgel.    3 month ICM trend: 09/18/2023.    12-14 Month ICM trend:     Karie Soda, RN 09/20/2023 2:41 PM

## 2023-09-26 ENCOUNTER — Encounter: Payer: Self-pay | Admitting: Family

## 2023-09-26 ENCOUNTER — Ambulatory Visit (INDEPENDENT_AMBULATORY_CARE_PROVIDER_SITE_OTHER): Payer: Medicare Other | Admitting: Family

## 2023-09-26 DIAGNOSIS — M654 Radial styloid tenosynovitis [de Quervain]: Secondary | ICD-10-CM

## 2023-09-26 MED ORDER — METHYLPREDNISOLONE ACETATE 40 MG/ML IJ SUSP
40.0000 mg | INTRAMUSCULAR | Status: AC | PRN
Start: 2023-09-26 — End: 2023-09-26
  Administered 2023-09-26: 40 mg

## 2023-09-26 MED ORDER — LIDOCAINE HCL 1 % IJ SOLN
1.0000 mL | INTRAMUSCULAR | Status: AC | PRN
  Administered 2023-09-26: 1 mL

## 2023-09-26 NOTE — Progress Notes (Signed)
Office Visit Note   Patient: Melinda Mckinney           Date of Birth: 1955-11-30           MRN: [id number] Visit Date: 09/26/2023              Requested by: Jeralyn Bennett, MD [address] [place],  [place] [address] PCP: Jeralyn Bennett, MD  Chief Complaint  Patient presents with   Left Thumb - Pain      HPI: The patient is a 68 year old woman who presents today complaining of a 3-week history of left wrist and thumb pain.  She is having discomfort at the base of the thumb extending up her wrist.  She reports this is worse in the morning when she first awakens describes some associated swelling and tenderness of the forearm and wrist.  Pain in the thumb.  Her husband endorses that she has been dropping some denies numbness or tingling no involvement of the other fingers no injury  she is right-hand dominant  Denies sleeping with her wrist extended  Assessment & Plan: Visit Diagnoses: No diagnosis found.  Plan: Depo-Medrol injection for de Quervain's tenosynovitis.  Concern for possible carpal tunnel given the history and exam.  Will send for EMGs of the left wrist as well.  Follow-Up Instructions: No follow-ups on file.   Right Hand Exam   Tenderness  The patient is experiencing tenderness in the radial area.  Range of Motion  The patient has normal right wrist ROM.   Muscle Strength  Grip: 5/5   Tests  Phalen's sign: positive Tinel's sign (median nerve): negative Finkelstein's test: positive  Other  Erythema: absent Sensation: normal Pulse: present      Patient is alert, oriented, no adenopathy, well-dressed, normal affect, normal respiratory effort.   Imaging: No results found. No images are attached to the encounter.  Labs: Lab Results  Component Value Date   HGBA1C 6.1 10/11/2022   HGBA1C 6.4 04/28/2022   HGBA1C 6.3 10/26/2021   ESRSEDRATE 38 (H) 06/18/2019   ESRSEDRATE 12 01/06/2017   ESRSEDRATE 33 (H) 04/16/2014   CRP <0.8  06/18/2019   CRP 1.0 (H) 05/28/2019   CRP 2.3 (H) 05/27/2019   LABURIC 7.3 (H) 04/28/2022   REPTSTATUS 06/13/2019 FINAL 06/11/2019   CULT 10,000 COLONIES/mL YEAST (A) 06/11/2019   LABORGA NO GROWTH 07/24/2014     Lab Results  Component Value Date   ALBUMIN 4.3 09/16/2022   ALBUMIN 4.0 05/09/2022   ALBUMIN 4.0 11/04/2021    Lab Results  Component Value Date   MG 3.2 (H) 06/22/2019   MG 1.9 06/21/2019   MG 1.9 06/20/2019   Lab Results  Component Value Date   VD25OH 49.21 10/19/2022   VD25OH 58.09 08/26/2021   VD25OH 66.05 08/25/2020    No results found for: "PREALBUMIN"    Latest Ref Rng & Units 09/16/2022   12:00 AM 05/09/2022   12:00 AM 04/28/2022   11:00 AM  CBC EXTENDED  WBC 4.0 - 10.5 K/uL   6.2   RBC 3.87 - 5.11 Mil/uL   3.78   Hemoglobin 12.0 - 16.0 11.3     10.4     11.7   HCT 36.0 - 46.0 %   35.8   Platelets 150.0 - 400.0 K/uL   295.0   NEUT# 1.4 - 7.7 K/uL   4.1   Lymph# 0.7 - 4.0 K/uL   1.5      This result is from an  external source.     There is no height or weight on file to calculate BMI.  Orders:  No orders of the defined types were placed in this encounter.  No orders of the defined types were placed in this encounter.    Procedures: Hand/UE Inj: L extensor compartment 1 for de Quervain's tenosynovitis on 09/26/2023 12:42 PM Indications: therapeutic and diagnostic Details: 22 G needle Medications: 1 mL lidocaine 1 %; 40 mg methylPREDNISolone acetate 40 MG/ML Outcome: tolerated well, no immediate complications Procedure, treatment alternatives, risks and benefits explained, specific risks discussed. Consent was given by the patient. Immediately prior to procedure a time out was called to verify the correct patient, procedure, equipment, support staff and site/side marked as required. Patient was prepped and draped in the usual sterile fashion.      Clinical Data: No additional findings.  ROS:  All other systems negative, except as  noted in the HPI. Review of Systems  Objective: Vital Signs: There were no vitals taken for this visit.  Specialty Comments:  No specialty comments available.  PMFS History: Patient Active Problem List   Diagnosis Date Noted   Complete heart block (HCC) 02/05/2020   Pacemaker 02/05/2020   Permanent atrial fibrillation (HCC) 02/05/2020   Non-ischemic cardiomyopathy (HCC) 07/10/2019   S/P ICD (internal cardiac defibrillator) procedure    Pneumonia due to COVID-19 virus 05/25/2019   Encounter for monitoring sotalol therapy 08/03/2017   Acute systolic (congestive) heart failure (HCC) 07/27/2017   Atrial fibrillation with RVR (HCC) 06/29/2017   PCP NOTES >>>> 06/16/2015   Back pain 02/05/2015   Hypothyroidism 01/19/2015   Vitamin D deficiency    Vulvar atrophy 08/20/2014   Annual physical exam 07/24/2014   Anemia 07/24/2014   DM2 (diabetes mellitus, type 2) (HCC) 09/27/2013   Insomnia 09/27/2013   Intertrigo 09/27/2013   Urolithiasis 09/27/2013   Essential hypertension    GERD, h/o achalasia s/p heller ~2009)    Hyperlipidemia LDL goal <70    CAD- H/O prior PCI    Osteoporosis    Fibromyalgia    Past Medical History:  Diagnosis Date   Anemia    Anxiety and depression    Asthma    Borderline diabetes    CAD (coronary artery disease) ~2009   stent    Cervical cancer (HCC)    "stage 1; had cryotherapy"   CHF (congestive heart failure) (HCC)    Chronic lower back pain    Complete heart block (HCC) 06/2019   s/p AV nodal ablation and CRT-P by Dr Ladona Ridgel   DDD (degenerative disc disease), lumbar    Dr. Ethelene Hal, recommends lumbar epidural steroid injections L5-S1 to the right   Depression    Fibromyalgia    GERD (gastroesophageal reflux disease)    High cholesterol    History of hiatal hernia    History of kidney stones    Hypertension    Hypothyroidism    Osteoporosis    Permanent atrial fibrillation (HCC) 07/2017   s/p AV nodal ablation   Thyroid disease      Family History  Problem Relation Age of Onset   Breast cancer Other        aunt    CAD Brother    Lung cancer Mother        F and M   Diabetes Father        F and mother    CAD Father    Lung cancer Father    Stroke Sister  Colon cancer Neg Hx     Past Surgical History:  Procedure Laterality Date   AV NODE ABLATION N/A 06/24/2019   Procedure: AV NODE ABLATION;  Surgeon: Marinus Maw, MD;  Location: [facility] INVASIVE CV LAB;  Service: Cardiovascular;  Laterality: N/A;   BIV PACEMAKER INSERTION CRT-P N/A 06/24/2019   Procedure: BIV PACEMAKER INSERTION CRT-P;  Surgeon: Marinus Maw, MD;  Location: [facility] INVASIVE CV LAB;  Service: Cardiovascular;  Laterality: N/A;   CARDIAC CATHETERIZATION  ~ 2008   CARDIOVERSION N/A 08/05/2017   Procedure: CARDIOVERSION;  Surgeon: Marinus Maw, MD;  Location[facility] OR;  Service: Cardiovascular;  Laterality: N/A;   CARDIOVERSION N/A 08/24/2017   Procedure: CARDIOVERSION;  Surgeon: Thurmon Fair, MD;  Location: [facility] ENDOSCOPY;  Service: Cardiovascular;  Laterality: N/A;   CARDIOVERSION N/A 06/19/2019   Procedure: CARDIOVERSION;  Surgeon: Laurey Morale, MD;  Location: [facility] OR;  Service: Cardiovascular;  Laterality: N/A;   CARDIOVERSION N/A 06/21/2019   Procedure: CARDIOVERSION;  Surgeon: Laurey Morale, MD;  Location: [facility] ENDOSCOPY;  Service: Cardiovascular;  Laterality: N/A;   CESAREAN SECTION  1975; ~ 1978/1979; 1984   ESOPHAGOMYOTOMY  2009   GYNECOLOGIC CRYOSURGERY  X 2   "stage 1 cancer"   HERNIA REPAIR  X 6   "all in my stomach" (07/27/2017)   KNEE ARTHROSCOPY Right    NEPHRECTOMY Right 1991   in [place], due to fibrosis and lithiasis   RIGHT/LEFT HEART CATH AND CORONARY ANGIOGRAPHY N/A 06/17/2019   Procedure: RIGHT/LEFT HEART CATH AND CORONARY ANGIOGRAPHY;  Surgeon: Runell Gess, MD;  Location: [facility] INVASIVE CV LAB;  Service: Cardiovascular;  Laterality: N/A;   TEE WITHOUT CARDIOVERSION N/A 06/19/2019   Procedure: TRANSESOPHAGEAL  ECHOCARDIOGRAM (TEE);  Surgeon: Laurey Morale, MD;  Location: [facility] OR;  Service: Cardiovascular;  Laterality: N/A;   TUBAL LIGATION     Social History   Occupational History   Occupation: stay home   Tobacco Use   Smoking status: Former    Current packs/day: 0.00    Average packs/day: 2.0 packs/day for 28.0 years (56.0 ttl pk-yrs)    Types: Cigarettes    Start date: 57    Quit date: 2000    Years since quitting: 25.1   Smokeless tobacco: Never  Vaping Use   Vaping status: Never Used  Substance and Sexual Activity   Alcohol use: Yes    Alcohol/week: 5.0 standard drinks of alcohol    Types: 5 Cans of beer per week    Comment: 07/27/2017 "3-4 drinks//month"   Drug use: No   Sexual activity: Not Currently    Comment: 1ST INTERCOURSE- 16, PARTNERS - 2

## 2023-09-27 NOTE — Progress Notes (Signed)
PCP: Jeralyn Bennett, MD  Nephrology: Dr. Thedore Mins HF Cardiology: Dr. Shirlee Latch  68 y.o. with CAD s/p PCI in 2010, chronic combined systolic and diastolic HF (EF 16-10% in 2018), atrial fibrillation on chronic anticoagulation w/ Xarelto, HTN, and hypothyroidism was diagnosed w/ COVID-19 in Sep 2020 and treated at [facility]. COVID infection c/b PNA. She was discharged and readmitted back to [facility] 10/9-10/13/20 for gastroenteritis, felt to be a sequela of COVID-19. She was managed w/ supportive care and discharged home. Had f/u with PCP 10/22 and endorsed worsening LLQ pain leading to abdominal CT which showed acute diverticulitis. She was started on outpatient antibiotics, cipro + flagyl but symptoms failed to improve, prompting her to seek emergency medical care at [facility].  Initial EKG in the ED showed afib w/ RVR for which general cardiology was consulted. She was started on IV dilt for rate control but later required treatment w/ IV amiodarone due to persistent rapid ventricular response. Hospital course c/b volume overload, requiring IV diuretics. 2D echo was obtained and showed worsening LVEF, now 25-30% w/ moderate MR. She underwent subsequent R/LHC in 11/20 which demonstrated widely patent coronaries. RHC showed elevated mean RA pressure at 22, PWP 43, LVEDP 45 and low CO. CI by Fick 1.6. She underwent TEE-guided DCCV, TEE showed EF 15% with moderate RV dysfunction and moderate MR.  She went back into rapid atrial fibrillation.  She failed additional cardioversions.  Despite amiodarone gtt, HR remained in the 140s-150s.  Finally, she underwent AV nodal ablation with BiV pacing due to inability to control atrial fibrillation. While in the hospital, she was on milrinone gtt for a period of time, but we were able to taper it off. She was discharged home after medication optimization.   Echo in 2/21 showed EF up to 40-45%, mild diffuse hypokinesis, RV low normal function.  Echo in 3/22 showed EF  55-60%, RV normal, severe LAE.    Echo 8/24 showed EF 55-60%  S/p right hip replacement 8/24.  Today she returns for HF follow up with her son who interprets. Overall feeling fine. Feels tired and fatigued. She has SOB "sometimes", when walking up steps. She has occasional positional dizziness, no falls. Denies palpitations, abnormal bleeding, CP, edema, or PND/Orthopnea. Appetite ok. No fever or chills. Weight at home 220 pounds. Taking all medications.   Device interrogation (personally reviewed): OptiVol up since 08/18/23, but thoracic impedence near reference, 100% AF, 4.4 hr/day activity, 99.9% VP  Labs (6/23): K 4.6, creatinine 1.87 Labs (2/24): LDL 59 Labs (7/24): K 3.7, creatinine 1.68 Labs (9/24): K 4.7, creatinine 1.78 Labs (2/25): K 4.1, creatinine 1.79  PMH: 1. Hypothyroidism 2. Single kidney s/p nephrectomy 3. Atrial fibrillation: Initially diagnosed in 2018.  She was on sotalol at one point to maintain NSR.  - Uncontrollable atrial fibrillation during 11/20 admission, patient eventually had AV nodal ablation with CRT-P implantation.  4. H/o achalasia 5. GERD 6. Fibromyalgia 7. H/o diverticulitis in 10/20 8. COVID-19 in 9/20.  9. H/o cervical cancer: cryotherapy.  10. Degenerative disc disease.  11. Chronic systolic CHF: Nonischemic cardiomyopathy, possibly tachycardia-mediated in setting of uncontrolled atrial fibrillation. Medtronic CRT-P device.  - Echo (2018): EF 45-50%.  - Echo (11/20): EF 25-30%, mildly decreased RV systolic function, moderate MR.  - TEE (11/20): EF 15%, moderately decreased RV systolic function, moderate MR.  - Cardiac MRI (11/20): EF 34%, RV EF 30%, no LGE (done after AV nodal ablation and BiV pacing).  - RHC (11/20): mean RA 22, mean  PCWP 43, LVEDP 45, CI 1.6 (Fick) and 2.7 (thermodilution).  - Echo (2/21): EF 40-45%, mild diffuse hypokinesis, low normal RV systolic function.  - Echo (5/22): EF 55-60%, RV normal, severe LAE - Echo (8/24): EF  55-60%, RV normal 12. CAD: PCI in 2010.  - LHC (11/20): No significant coronary disease.  13. Avascular necrosis left hip - s/p L THR 8/24  Social History   Socioeconomic History   Marital status: Married    Spouse name: Not on file   Number of children: 3   Years of education: Not on file   Highest education level: Not on file  Occupational History   Occupation: stay home   Tobacco Use   Smoking status: Former    Current packs/day: 0.00    Average packs/day: 2.0 packs/day for 28.0 years (56.0 ttl pk-yrs)    Types: Cigarettes    Start date: 20    Quit date: 2000    Years since quitting: 25.1   Smokeless tobacco: Never  Vaping Use   Vaping status: Never Used  Substance and Sexual Activity   Alcohol use: Yes    Alcohol/week: 5.0 standard drinks of alcohol    Types: 5 Cans of beer per week    Comment: 07/27/2017 "3-4 drinks//month"   Drug use: No   Sexual activity: Not Currently    Comment: 1ST INTERCOURSE- 16, PARTNERS - 2  Other Topics Concern   Not on file  Social History Narrative   Original from [place]   3 children, 2 alive   Household --pt and husband    Social Drivers of Corporate investment banker Strain: Low Risk  (11/29/2021)   Overall Financial Resource Strain (CARDIA)    Difficulty of Paying Living Expenses: Not hard at all  Food Insecurity: Low Risk  (03/30/2023)   Received from [facility]   Hunger Vital Sign    Worried About Running Out of Food in the Last Year: Never true    Ran Out of Food in the Last Year: Never true  Transportation Needs: Not on file (03/30/2023)  Physical Activity: Inactive (11/29/2021)   Exercise Vital Sign    Days of Exercise per Week: 0 days    Minutes of Exercise per Session: 0 min  Stress: No Stress Concern Present (11/29/2021)   [employer] of Occupational Health - Occupational Stress Questionnaire    Feeling of Stress : Not at all  Social Connections: Moderately Integrated (11/29/2021)   Social Connection  and Isolation Panel [NHANES]    Frequency of Communication with Friends and Family: More than three times a week    Frequency of Social Gatherings with Friends and Family: More than three times a week    Attends Religious Services: Never    Database administrator or Organizations: Yes    Attends Engineer, structural: More than 4 times per year    Marital Status: Married  Catering manager Violence: Not At Risk (11/29/2021)   Humiliation, Afraid, Rape, and Kick questionnaire    Fear of Current or Ex-Partner: No    Emotionally Abused: No    Physically Abused: No    Sexually Abused: No   Family History  Problem Relation Age of Onset   Breast cancer Other        aunt    CAD Brother    Lung cancer Mother        F and M   Diabetes Father        F  and mother    CAD Father    Lung cancer Father    Stroke Sister    Colon cancer Neg Hx    ROS: All systems reviewed and negative except as per HPI.   Current Outpatient Medications  Medication Sig Dispense Refill   alendronate (FOSAMAX) 70 MG tablet TAKE 1 TABLET BY MOUTH ONCE A WEEK ON AN EMPTY STOMACH BEFORE BREAKFAST. REMAIN UPRIGHT FOR 30 MINUTES AND TAKE WITH A FULL GLASS OF WATER (Patient taking differently: TAKE 1 TABLET BY MOUTH ONCE A WEEK  ON TUESDAYS ON AN EMPTY STOMACH BEFORE BREAKFAST. REMAIN UPRIGHT FOR 30 MINUTES AND TAKE WITH A FULL GLASS OF WATER) 12 tablet 1   allopurinol (ZYLOPRIM) 100 MG tablet Take 1 tablet (100 mg total) by mouth daily. 90 tablet 1   atorvastatin (LIPITOR) 40 MG tablet Take 1.5 tablets (60 mg total) by mouth at bedtime. 135 tablet 1   carvedilol (COREG) 6.25 MG tablet Take 1 tablet (6.25 mg total) by mouth 2 (two) times daily with a meal. 180 tablet 3   Cholecalciferol (VITAMIN D3) 5000 UNITS CAPS Take 5,000 Units by mouth daily.      clobetasol cream (TEMOVATE) 0.05 % Apply topically 2 (two) times daily as needed. 60 g 1   cyclobenzaprine (FLEXERIL) 10 MG tablet Take 1 tablet (10 mg total) by  mouth 2 (two) times daily as needed for muscle spasms. 60 tablet 1   DULoxetine (CYMBALTA) 60 MG capsule TAKE 1 CAPSULE BY MOUTH EVERY DAY 90 capsule 1   FARXIGA 10 MG TABS tablet Take 1 tablet (10 mg total) by mouth daily before breakfast. 30 tablet 11   ferrous fumarate (HEMOCYTE - 106 MG FE) 325 (106 Fe) MG TABS tablet Take 1 tablet (106 mg of iron total) by mouth 2 (two) times daily. Do not take at the same time as omeprazole (Prilosec) 60 tablet 6   ketoconazole (NIZORAL) 2 % cream Apply 1 application topically daily. (Patient taking differently: Apply 1 application  topically daily as needed.) 60 g 0   levothyroxine (SYNTHROID) 112 MCG tablet Take 1 tablet (112 mcg total) by mouth daily before breakfast. 90 tablet 1   metFORMIN (GLUCOPHAGE) 500 MG tablet Take 1 tablet (500 mg total) by mouth 2 (two) times daily with a meal. 180 tablet 0   omeprazole (PRILOSEC) 20 MG capsule TAKE 1 CAPSULE(20 MG) BY MOUTH TWICE DAILY BEFORE A MEAL 60 capsule 1   potassium chloride SA (KLOR-CON M) 20 MEQ tablet Take 1 tablet (20 mEq total) by mouth daily. 30 tablet 11   Rivaroxaban (XARELTO) 15 MG TABS tablet Take 1 tablet (15 mg total) by mouth daily with supper. 90 tablet 3   sacubitril-valsartan (ENTRESTO) 24-26 MG Take 1 tablet by mouth 2 (two) times daily. 60 tablet 6   spironolactone (ALDACTONE) 25 MG tablet Take 0.5 tablets (12.5 mg total) by mouth at bedtime. 90 tablet 3   torsemide (DEMADEX) 20 MG tablet Take 3 tablets (60 mg total) by mouth 2 (two) times daily. 540 tablet 2   triamcinolone (KENALOG) 0.025 % ointment Apply topically as needed.     No current facility-administered medications for this encounter.   Wt Readings from Last 3 Encounters:  09/29/23 103.2 kg (227 lb 9.6 oz)  06/19/23 100.1 kg (220 lb 9.6 oz)  03/08/23 98.8 kg (217 lb 12.8 oz)   BP (!) 96/54   Pulse 92   Wt 103.2 kg (227 lb 9.6 oz)   SpO2 97%  BMI 37.87 kg/m  Physical Exam General:  NAD. No resp difficulty, walked  into clinic HEENT: Normal Neck: Supple. JVP 8-10 Cor: Regular rate & rhythm. No rubs, gallops or murmurs. Lungs: Clear Abdomen: Soft, obese, nontender, nondistended.  Extremities: No cyanosis, clubbing, rash, edema Neuro: Alert & oriented x 3, moves all 4 extremities w/o difficulty. Affect pleasant.  Assessment/Plan: 1. Chronic systolic CHF:  Nonischemic cardiomyopathy.  Echo in 2018 with EF 45-50%.  Echo in 11/20 with EF 25-30%, diffuse hypokinesis, mildly decreased RV systolic function, moderate MR. LHC/RHC in 11/20 with no significant coronary disease, markedly elevated filling pressures and low cardiac output (CI 1.6).  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 Cardiac MRI 11/20 showed LVEF 34%, RVEF 30%, no LGE => perhaps pointing to tachy-mediated CMP as more likely etiology.  Initially required inotropic support w/ milrinone but able to titrate off.  Now s/p AV nodal ablation with MDT CRT-P.  Echo in 2/21 showed EF up to 40-45%, echo in 3/22 with EF up to 55-60%.  Echo 8/24 EF 55-60%. NYHA class II, she is mildly volume overloaded by exam, weight up 7 lbs, OptiVol suggests volume overload but thoracic impedence suggests she is nearing euvolemia. GDMT limited by soft BP and CKD. - Increase torsemide to 100/60 x 3 days, then back to 60 mg bid. BMET and BNP today. - Increase KCL to 40 daily x 3 days, then back to 20 daily. - Continue Entresto 24/26 bid. BMET today. - Continue spironolactone 12.5 mg at bedtime. - Continue dapagliflozin 10 mg daily.  - Continue Coreg 6.25 mg bid.    2. Atrial fibrillation: Persistent atrial fibrillation, it appears, at least since 9/20 when she was admitted with COVID-19. Prior, she had paroxysmal atrial fibrillation and was maintained on sotalol. She is now off sotalol.  Unable to cardiovert or rate control atrial fibrillation. Therefore, she had AV  nodal ablation with BiV pacing.  - Continue Xarelto 15 mg daily. No bleeding issues. 3. Mitral regurgitation: Functional MR, moderate on TEE.  Rate was controlled on cardiac MRI, MR was less impressive.  Minimal MR on subsequent echoes.  4. CKD stage 3: Baseline SCr 1.5-1.8. s/p nephrectomy - She is on Farxiga. BMET today. 5. Fatigue: likely multifactorial. Recent TSH & Vit D normal. - sleep study 2023 showed no OSA (AHI 3.1/hr) however she did have severe OSA during REM sleep (AHI 30/hr). Follows with Dr. Tresa Endo - Check iron studies and CBC today.  Follow up in 3 months with Dr. Shirlee Latch.  Anderson [facility] FNP-BC 09/29/2023

## 2023-09-28 ENCOUNTER — Telehealth (HOSPITAL_COMMUNITY): Payer: Self-pay

## 2023-09-28 DIAGNOSIS — M654 Radial styloid tenosynovitis [de Quervain]: Secondary | ICD-10-CM | POA: Diagnosis not present

## 2023-09-28 NOTE — Telephone Encounter (Signed)
Called to confirm/remind patient of their appointment at the [facility] on 09/29/23.   Patient reminded to bring all medications and/or complete list.  Confirmed patient has transportation. Gave directions, instructed to utilize valet parking.  Confirmed appointment prior to ending call.

## 2023-09-29 ENCOUNTER — Encounter (HOSPITAL_COMMUNITY): Payer: Self-pay

## 2023-09-29 ENCOUNTER — Ambulatory Visit (HOSPITAL_COMMUNITY)
Admission: RE | Admit: 2023-09-29 | Discharge: 2023-09-29 | Disposition: A | Discharge status: Home / Self Care | Payer: Medicare Other | Source: Ambulatory Visit | Attending: Family Medicine | Admitting: Family Medicine

## 2023-09-29 VITALS — BP 96/54 | HR 92 | Wt 227.6 lb

## 2023-09-29 DIAGNOSIS — Z7901 Long term (current) use of anticoagulants: Secondary | ICD-10-CM | POA: Insufficient documentation

## 2023-09-29 DIAGNOSIS — Z7984 Long term (current) use of oral hypoglycemic drugs: Secondary | ICD-10-CM | POA: Insufficient documentation

## 2023-09-29 DIAGNOSIS — Z79899 Other long term (current) drug therapy: Secondary | ICD-10-CM | POA: Insufficient documentation

## 2023-09-29 DIAGNOSIS — I4821 Permanent atrial fibrillation: Secondary | ICD-10-CM

## 2023-09-29 DIAGNOSIS — N183 Chronic kidney disease, stage 3 unspecified: Secondary | ICD-10-CM | POA: Diagnosis not present

## 2023-09-29 DIAGNOSIS — I340 Nonrheumatic mitral (valve) insufficiency: Secondary | ICD-10-CM | POA: Diagnosis not present

## 2023-09-29 DIAGNOSIS — I129 Hypertensive chronic kidney disease with stage 1 through stage 4 chronic kidney disease, or unspecified chronic kidney disease: Secondary | ICD-10-CM | POA: Diagnosis not present

## 2023-09-29 DIAGNOSIS — R0602 Shortness of breath: Secondary | ICD-10-CM | POA: Diagnosis not present

## 2023-09-29 DIAGNOSIS — R5383 Other fatigue: Secondary | ICD-10-CM

## 2023-09-29 DIAGNOSIS — Z905 Acquired absence of kidney: Secondary | ICD-10-CM | POA: Insufficient documentation

## 2023-09-29 DIAGNOSIS — N1832 Chronic kidney disease, stage 3b: Secondary | ICD-10-CM | POA: Diagnosis not present

## 2023-09-29 DIAGNOSIS — Z8616 Personal history of COVID-19: Secondary | ICD-10-CM | POA: Insufficient documentation

## 2023-09-29 DIAGNOSIS — E039 Hypothyroidism, unspecified: Secondary | ICD-10-CM | POA: Insufficient documentation

## 2023-09-29 DIAGNOSIS — I428 Other cardiomyopathies: Secondary | ICD-10-CM | POA: Diagnosis not present

## 2023-09-29 DIAGNOSIS — I4819 Other persistent atrial fibrillation: Secondary | ICD-10-CM | POA: Diagnosis not present

## 2023-09-29 DIAGNOSIS — I5022 Chronic systolic (congestive) heart failure: Secondary | ICD-10-CM

## 2023-09-29 LAB — BASIC METABOLIC PANEL
Anion gap: 11 (ref 5–15)
BUN: 32 mg/dL — ABNORMAL HIGH (ref 8–23)
CO2: 27 mmol/L (ref 22–32)
Calcium: 9.7 mg/dL (ref 8.9–10.3)
Chloride: 102 mmol/L (ref 98–111)
Creatinine, Ser: 1.99 mg/dL — ABNORMAL HIGH (ref 0.44–1.00)
GFR, Estimated: 27 mL/min — ABNORMAL LOW (ref >60)
Glucose, Bld: 103 mg/dL — ABNORMAL HIGH (ref 70–99)
Potassium: 5 mmol/L (ref 3.5–5.1)
Sodium: 140 mmol/L (ref 135–145)

## 2023-09-29 LAB — IRON AND TIBC
Iron: 42 ug/dL (ref 28–170)
Saturation Ratios: 8 % — ABNORMAL LOW (ref 10.4–31.8)
TIBC: 526 ug/dL — ABNORMAL HIGH (ref 250–450)
UIBC: 484 ug/dL

## 2023-09-29 LAB — CBC
HCT: 31.1 % — ABNORMAL LOW (ref 36.0–46.0)
Hemoglobin: 9.8 g/dL — ABNORMAL LOW (ref 12.0–15.0)
MCH: 26.8 pg (ref 26.0–34.0)
MCHC: 31.5 g/dL (ref 30.0–36.0)
MCV: 85 fL (ref 80.0–100.0)
Platelets: 348 10*3/uL (ref 150–400)
RBC: 3.66 MIL/uL — ABNORMAL LOW (ref 3.87–5.11)
RDW: 18 % — ABNORMAL HIGH (ref 11.5–15.5)
WBC: 6.7 10*3/uL (ref 4.0–10.5)
nRBC: 0 % (ref 0.0–0.2)

## 2023-09-29 LAB — FERRITIN: Ferritin: 8 ng/mL — ABNORMAL LOW (ref 11–307)

## 2023-09-29 LAB — BRAIN NATRIURETIC PEPTIDE: B Natriuretic Peptide: 246 pg/mL — ABNORMAL HIGH (ref 0.0–100.0)

## 2023-09-29 MED ORDER — TORSEMIDE 20 MG PO TABS: 60.0000 mg | ORAL_TABLET | Freq: Two times a day (BID) | ORAL | 3 refills | Status: DC

## 2023-09-29 NOTE — Patient Instructions (Addendum)
Thank you for coming in today  If you had labs drawn today, any labs that are abnormal the clinic will call you No news is good news  Medications: Take extra Torsemide 40 mg for 3 days 09/29/2023 09/30/2023 10/01/2023  100 mg 5 tablets in the morning and 60 mg in the evening 3 tablets    Also increase Potassium 40 meq 2 tablets daily for 3 days 09/29/2023 09/30/2023 10/01/2023   Then 10/02/2023 back to 60 mg twice daily 3 tablets in the morning and 3 tablets in the evening 10/02/2023 take Potassium to 20 meq 1 tablet daily   Follow up appointments:  Your physician recommends that you schedule a follow-up appointment in:  3 month With Dr. Earlean Shawl will receive a reminder letter in the mail a few months in advance. If you don't receive a letter, please call our office to schedule the follow-up appointment.    Do the following things EVERYDAY: Weigh yourself in the morning before breakfast. Write it down and keep it in a log. Take your medicines as prescribed Eat low salt foods--Limit salt (sodium) to 2000 mg per day.  Stay as active as you can everyday Limit all fluids for the day to less than 2 liters   At the [facility], you and your health needs are our priority. As part of our continuing mission to provide you with exceptional heart care, we have created designated Provider Care Teams. These Care Teams include your primary Cardiologist (physician) and Advanced Practice Providers (APPs- Physician Assistants and Nurse Practitioners) who all work together to provide you with the care you need, when you need it.   You may see any of the following providers on your designated Care Team at your next follow up: Dr Arvilla Meres Dr Marca Ancona Dr. Marcos Eke, NP Robbie Lis, [place] [facility] [place], [place] Brynda Peon, NP Karle Plumber, [name]   Please be sure to bring in all your medications bottles to every appointment.     Thank you for choosing [facility]  If you have any questions or concerns before your next appointment please send [place] a message through [place] or call our office at [phone].    TO LEAVE A MESSAGE FOR THE NURSE SELECT OPTION 2, PLEASE LEAVE A MESSAGE INCLUDING: YOUR NAME DATE OF BIRTH CALL BACK NUMBER REASON FOR CALL**this is important as we prioritize the call backs  YOU WILL RECEIVE A CALL BACK THE SAME DAY AS LONG AS YOU CALL BEFORE 4:00 PM

## 2023-10-02 ENCOUNTER — Ambulatory Visit (INDEPENDENT_AMBULATORY_CARE_PROVIDER_SITE_OTHER): Payer: Medicare Other

## 2023-10-02 ENCOUNTER — Other Ambulatory Visit (HOSPITAL_COMMUNITY): Payer: Self-pay

## 2023-10-02 DIAGNOSIS — I5022 Chronic systolic (congestive) heart failure: Secondary | ICD-10-CM | POA: Diagnosis not present

## 2023-10-02 DIAGNOSIS — I428 Other cardiomyopathies: Secondary | ICD-10-CM

## 2023-10-02 LAB — CUP PACEART REMOTE DEVICE CHECK
Battery Remaining Longevity: 43 mo
Battery Voltage: 2.94 V
Brady Statistic RA Percent Paced: 0 %
Brady Statistic RV Percent Paced: 99.97 %
Date Time Interrogation Session: 20250216234419
Implantable Lead Connection Status: 753985
Implantable Lead Connection Status: 753985
Implantable Lead Connection Status: 753985
Implantable Lead Implant Date: 20201109
Implantable Lead Implant Date: 20201109
Implantable Lead Implant Date: 20201109
Implantable Lead Location: 753858
Implantable Lead Location: 753859
Implantable Lead Location: 753860
Implantable Lead Model: 4598
Implantable Lead Model: 5076
Implantable Lead Model: 5076
Implantable Pulse Generator Implant Date: 20201109
Lead Channel Impedance Value: 1007 Ohm
Lead Channel Impedance Value: 1007 Ohm
Lead Channel Impedance Value: 1102 Ohm
Lead Channel Impedance Value: 1330 Ohm
Lead Channel Impedance Value: 1520 Ohm
Lead Channel Impedance Value: 1577 Ohm
Lead Channel Impedance Value: 361 Ohm
Lead Channel Impedance Value: 399 Ohm
Lead Channel Impedance Value: 418 Ohm
Lead Channel Impedance Value: 494 Ohm
Lead Channel Impedance Value: 513 Ohm
Lead Channel Impedance Value: 703 Ohm
Lead Channel Impedance Value: 779 Ohm
Lead Channel Impedance Value: 855 Ohm
Lead Channel Pacing Threshold Amplitude: 0.5 V
Lead Channel Pacing Threshold Amplitude: 1.375 V
Lead Channel Pacing Threshold Pulse Width: 0.4 ms
Lead Channel Pacing Threshold Pulse Width: 0.8 ms
Lead Channel Sensing Intrinsic Amplitude: 0.625 mV
Lead Channel Sensing Intrinsic Amplitude: 0.625 mV
Lead Channel Sensing Intrinsic Amplitude: 9.75 mV
Lead Channel Sensing Intrinsic Amplitude: 9.75 mV
Lead Channel Setting Pacing Amplitude: 2 V
Lead Channel Setting Pacing Amplitude: 2.5 V
Lead Channel Setting Pacing Pulse Width: 0.4 ms
Lead Channel Setting Pacing Pulse Width: 0.8 ms
Lead Channel Setting Sensing Sensitivity: 2 mV
Zone Setting Status: 755011
Zone Setting Status: 755011

## 2023-10-09 ENCOUNTER — Ambulatory Visit: Payer: Medicare Other | Attending: Internal Medicine

## 2023-10-09 DIAGNOSIS — Z950 Presence of cardiac pacemaker: Secondary | ICD-10-CM | POA: Diagnosis not present

## 2023-10-09 DIAGNOSIS — I5022 Chronic systolic (congestive) heart failure: Secondary | ICD-10-CM

## 2023-10-11 NOTE — Progress Notes (Signed)
 EPIC Encounter for ICM Monitoring  Patient Name: Melinda Mckinney is a 68 y.o. female Date: 10/11/2023 Primary Care Physican: Jeralyn Bennett, MD Primary Cardiologist: Shirlee Latch Electrophysiologist: Rae Roam Pacing: 100%         08/08/2023 Weight: 220 lbs 08/29/2023 Weight: Unknown, has not weighed in the last couple of weeks 09/12/2023 Weight: 219 lbs    Time in AT/AF  24.0 hr/day (100.0%)            Transmission results reviewed.    Diet:  Does not follow low salt diet.  Typically eats restaurant breakfast every morning.   Optivol thoracic impedance trending closer to baseline.    Prescribed:  Torsemide 20 mg take 3 tablets (60 mg total) by mouth twice a day.  Potassium 20 mEq take 1 tablet daily  Spironolactone 25 mg take 0.5 tablet (12.5 mg total) by mouth at bedtime   Labs: 09/29/2023 Creatinine 1.99, BUN 32, Potassium 5.0, Sodium 140, GFR 27 09/19/2023 Creatinine 1.79, BUN 33, Potassium 4.1, Sodium 138, GFR 30 09/06/2023 Creatinine 1.66, BUN 32, Potassium 4.3, Sodium 140, GFR 33 08/02/2023 Creatinine 1.82, BUN 31, Potassium 3.6, Sodium 141, GFR 30 A complete set of results can be found in Results Review.   Recommendations:  No changes.   Follow-up plan: ICM clinic phone appointment on 11/13/2023.   91 day device clinic remote transmission 01/01/2024.     EP/Cardiology Office Visits:  Recall 12/28/2023 with Dr Shirlee Latch.    Recall 06/08/2023 with Dr Nelly Laurence.       Copy of ICM check sent to Dr. Ladona Ridgel.    3 month ICM trend: 10/09/2023.    12-14 Month ICM trend:     Karie Soda, RN 10/11/2023 4:32 PM

## 2023-10-15 ENCOUNTER — Encounter: Payer: Self-pay | Admitting: Cardiovascular Disease

## 2023-10-16 ENCOUNTER — Encounter: Payer: Self-pay | Admitting: Internal Medicine

## 2023-10-16 ENCOUNTER — Ambulatory Visit (INDEPENDENT_AMBULATORY_CARE_PROVIDER_SITE_OTHER): Payer: Medicare Other | Admitting: Internal Medicine

## 2023-10-16 VITALS — BP 118/64 | HR 92 | Ht 65.0 in | Wt 223.0 lb

## 2023-10-16 DIAGNOSIS — M810 Age-related osteoporosis without current pathological fracture: Secondary | ICD-10-CM

## 2023-10-16 DIAGNOSIS — E039 Hypothyroidism, unspecified: Secondary | ICD-10-CM

## 2023-10-16 DIAGNOSIS — E559 Vitamin D deficiency, unspecified: Secondary | ICD-10-CM | POA: Diagnosis not present

## 2023-10-16 NOTE — Progress Notes (Signed)
 Patient ID: Melinda Mckinney, female   DOB: October 09, 1955, 68 y.o.   MRN: [id number]  HPI  Melinda Mckinney is a 68 y.o.-year-old female, returning for f/u for vitamin D deficiency, osteoporosis, and hypothyroidism. Last visit 1 year ago.  She is here with her husband who offers part of the history especially regarding her recent medical history, medication regimen and symptoms.  He also translates for [place].    Interim history: No falls or fractures since last visit. No disequilibrium, vertigo, vision problems. She has orthostatic dizziness - occasionally. She had a R THR 03/2023 >> overall pain better. Also, back pain improved.  Osteoporosis.   Reviewed DXA scan reports: 10/24/2022 ([place], [facility])  Lumbar spine L2-L4(L3) Femoral neck (FN)  T-score -3.3 RFN: -1.0 LFN: -1.6  Change in BMD from previous DXA test (%) -2.9% -0.2%  (*) statistically significant  Date Lumbar spine L1-L4 Femoral neck (FN)  09/06/2018 ([place]) -2.8 RFN: -1.5 LFN: -1.1   Previously: Date L1-L4 T score FN T score  09/04/2014 ([facility])  -2.8 LFN: -1.1 RFN: -1.2  03/25/2007 ([facility])  -2.6    She had a possible hand fracture in 03/2020.  This was not clearly confirmed.  Previously on Evista since 09/2017 but stopped in 2020 - prev. Rx'ed by Dr. Lily Peer.   We started Fosamax 70 mg weekly in 08/2019.  No jaw/hip/thigh pain.  Vitamin D def  -Diagnosed in 2000 -No history of hypercalcemia  Reviewed her vitamin D levels: 01/18/2023: vit D 62.8 Lab Results  Component Value Date   VD25OH 49.21 10/19/2022   VD25OH 58.09 08/26/2021   VD25OH 66.05 08/25/2020   VD25OH 45.46 08/22/2019   VD25OH 32.55 04/26/2018   VD25OH 40 09/08/2016   VD25OH 29.88 (L) 08/09/2016   VD25OH 24 (L) 09/03/2015   VD25OH 29.63 (L) 08/07/2015   VD25OH 26.02 (L) 01/19/2015  She takes 5000 units vitamin daily.  Celiac panel was negative in 11/2014.  She has a history of abdominal pain/bloating/nausea/sometimes vomiting. She was seen by  GI >> EGD >> had surgery for achalasia.  Lab Results  Component Value Date   PTH 31 09/12/2014   CALCIUM 9.7 09/29/2023   CALCIUM 9.4 09/19/2023   CALCIUM 9.9 09/06/2023   CALCIUM 9.9 08/02/2023   CALCIUM 10.0 03/28/2023   CALCIUM 9.9 03/20/2023   CALCIUM 9.0 03/01/2023   CALCIUM 9.3 01/24/2023   CALCIUM 9.5 09/16/2022   CALCIUM 8.9 05/09/2022   She has a history of kidney stones. R nephrectomy for hydronephrosis 2/2 kidney stone.  At that time, she also had surgery for incarcerated hernia - had several hernias  + CKD: Lab Results  Component Value Date   BUN 32 (H) 09/29/2023   CREATININE 1.99 (H) 09/29/2023   Hypothyroidism:  She is taking 112 mcg daily: - in am - fasting - at least 30 min from b'fast - no calcium - + iron - added 3-4h after LT4 - no multivitamins - + PPIs -first dose at least 3 hours after LT4 - not on Biotin  Reviewed her TFTs: 01/18/2023: TSH 2.601 Lab Results  Component Value Date   TSH 1.90 10/11/2022   TSH 0.71 04/28/2022   TSH 3.35 08/26/2021   TSH 0.72 06/22/2021   TSH 0.23 (L) 02/23/2021   TSH 0.94 08/25/2020   TSH 1.33 05/20/2020   TSH 0.18 (L) 03/18/2020   TSH 1.232 10/04/2019   TSH 5.07 (H) 08/22/2019   Pt denies: - feeling nodules in neck - hoarseness - choking She has a history  of dysphagia, believed to be due to GERD.  She is on PPIs.   + FH with thyroid ds: Mother, daughter, and son. No FH of thyroid cancer. No h/o radiation tx to head or neck. No herbal supplements. No Biotin use. No recent steroids use.   She also has a history of prior nephrectomy, osteoarthritis, A fib, history of acute CHF.  On metoprolol and Xarelto.  She had 2 previous cardioversions in the past.  ROS: + See HPI  I reviewed pt's medications, allergies, PMH, social hx, family hx, and changes were documented in the history of present illness. Otherwise, unchanged from my initial visit note.  Past Medical History:  Diagnosis Date   Anemia     Anxiety and depression    Asthma    Borderline diabetes    CAD (coronary artery disease) ~2009   stent    Cervical cancer (HCC)    "stage 1; had cryotherapy"   CHF (congestive heart failure) (HCC)    Chronic lower back pain    Complete heart block (HCC) 06/2019   s/p AV nodal ablation and CRT-P by Dr Ladona Ridgel   DDD (degenerative disc disease), lumbar    Dr. Ethelene Hal, recommends lumbar epidural steroid injections L5-S1 to the right   Depression    Fibromyalgia    GERD (gastroesophageal reflux disease)    High cholesterol    History of hiatal hernia    History of kidney stones    Hypertension    Hypothyroidism    Osteoporosis    Permanent atrial fibrillation (HCC) 07/2017   s/p AV nodal ablation   Thyroid disease    Past Surgical History:  Procedure Laterality Date   AV NODE ABLATION N/A 06/24/2019   Procedure: AV NODE ABLATION;  Surgeon: Marinus Maw, MD;  Location: [facility] INVASIVE CV LAB;  Service: Cardiovascular;  Laterality: N/A;   BIV PACEMAKER INSERTION CRT-P N/A 06/24/2019   Procedure: BIV PACEMAKER INSERTION CRT-P;  Surgeon: Marinus Maw, MD;  Location: [facility] INVASIVE CV LAB;  Service: Cardiovascular;  Laterality: N/A;   CARDIAC CATHETERIZATION  ~ 2008   CARDIOVERSION N/A 08/05/2017   Procedure: CARDIOVERSION;  Surgeon: Marinus Maw, MD;  Location: [facility] OR;  Service: Cardiovascular;  Laterality: N/A;   CARDIOVERSION N/A 08/24/2017   Procedure: CARDIOVERSION;  Surgeon: Thurmon Fair, MD;  Location: [facility] ENDOSCOPY;  Service: Cardiovascular;  Laterality: N/A;   CARDIOVERSION N/A 06/19/2019   Procedure: CARDIOVERSION;  Surgeon: Laurey Morale, MD;  Location: [facility] OR;  Service: Cardiovascular;  Laterality: N/A;   CARDIOVERSION N/A 06/21/2019   Procedure: CARDIOVERSION;  Surgeon: Laurey Morale, MD;  Location: [facility] ENDOSCOPY;  Service: Cardiovascular;  Laterality: N/A;   CESAREAN SECTION  1975; ~ 1978/1979; 1984   ESOPHAGOMYOTOMY  2009   GYNECOLOGIC CRYOSURGERY  X 2   "stage 1  cancer"   HERNIA REPAIR  X 6   "all in my stomach" (07/27/2017)   KNEE ARTHROSCOPY Right    NEPHRECTOMY Right 1991   in [place], due to fibrosis and lithiasis   RIGHT/LEFT HEART CATH AND CORONARY ANGIOGRAPHY N/A 06/17/2019   Procedure: RIGHT/LEFT HEART CATH AND CORONARY ANGIOGRAPHY;  Surgeon: Runell Gess, MD;  Location: [facility] INVASIVE CV LAB;  Service: Cardiovascular;  Laterality: N/A;   TEE WITHOUT CARDIOVERSION N/A 06/19/2019   Procedure: TRANSESOPHAGEAL ECHOCARDIOGRAM (TEE);  Surgeon: Laurey Morale, MD;  Location: [facility] OR;  Service: Cardiovascular;  Laterality: N/A;   TUBAL LIGATION     Social History   Socioeconomic History  Marital status: Married    Spouse name: Not on file   Number of children: 3   Years of education: Not on file   Highest education level: Not on file  Occupational History   Occupation: stay home   Tobacco Use   Smoking status: Former    Current packs/day: 0.00    Average packs/day: 2.0 packs/day for 28.0 years (56.0 ttl pk-yrs)    Types: Cigarettes    Start date: 42    Quit date: 2000    Years since quitting: 25.1   Smokeless tobacco: Never  Vaping Use   Vaping status: Never Used  Substance and Sexual Activity   Alcohol use: Yes    Alcohol/week: 5.0 standard drinks of alcohol    Types: 5 Cans of beer per week    Comment: 07/27/2017 "3-4 drinks//month"   Drug use: No   Sexual activity: Not Currently    Comment: 1ST INTERCOURSE- 16, PARTNERS - 2  Other Topics Concern   Not on file  Social History Narrative   Original from [place]   3 children, 2 alive   Household --pt and husband    Social Drivers of Corporate investment banker Strain: Low Risk  (11/29/2021)   Overall Financial Resource Strain (CARDIA)    Difficulty of Paying Living Expenses: Not hard at all  Food Insecurity: Low Risk  (03/30/2023)   Received from [facility]   Hunger Vital Sign    Worried About Running Out of Food in the Last Year: Never true    Ran Out of  Food in the Last Year: Never true  Transportation Needs: Not on file (03/30/2023)  Physical Activity: Inactive (11/29/2021)   Exercise Vital Sign    Days of Exercise per Week: 0 days    Minutes of Exercise per Session: 0 min  Stress: No Stress Concern Present (11/29/2021)   [employer] of Occupational Health - Occupational Stress Questionnaire    Feeling of Stress : Not at all  Social Connections: Moderately Integrated (11/29/2021)   Social Connection and Isolation Panel [NHANES]    Frequency of Communication with Friends and Family: More than three times a week    Frequency of Social Gatherings with Friends and Family: More than three times a week    Attends Religious Services: Never    Database administrator or Organizations: Yes    Attends Engineer, structural: More than 4 times per year    Marital Status: Married  Catering manager Violence: Not At Risk (11/29/2021)   Humiliation, Afraid, Rape, and Kick questionnaire    Fear of Current or Ex-Partner: No    Emotionally Abused: No    Physically Abused: No    Sexually Abused: No   Current Outpatient Medications on File Prior to Visit  Medication Sig Dispense Refill   alendronate (FOSAMAX) 70 MG tablet TAKE 1 TABLET BY MOUTH ONCE A WEEK ON AN EMPTY STOMACH BEFORE BREAKFAST. REMAIN UPRIGHT FOR 30 MINUTES AND TAKE WITH A FULL GLASS OF WATER (Patient taking differently: TAKE 1 TABLET BY MOUTH ONCE A WEEK  ON TUESDAYS ON AN EMPTY STOMACH BEFORE BREAKFAST. REMAIN UPRIGHT FOR 30 MINUTES AND TAKE WITH A FULL GLASS OF WATER) 12 tablet 1   allopurinol (ZYLOPRIM) 100 MG tablet Take 1 tablet (100 mg total) by mouth daily. 90 tablet 1   atorvastatin (LIPITOR) 40 MG tablet Take 1.5 tablets (60 mg total) by mouth at bedtime. 135 tablet 1   carvedilol (COREG) 6.25 MG tablet  Take 1 tablet (6.25 mg total) by mouth 2 (two) times daily with a meal. 180 tablet 3   Cholecalciferol (VITAMIN D3) 5000 UNITS CAPS Take 5,000 Units by mouth daily.       clobetasol cream (TEMOVATE) 0.05 % Apply topically 2 (two) times daily as needed. 60 g 1   cyclobenzaprine (FLEXERIL) 10 MG tablet Take 1 tablet (10 mg total) by mouth 2 (two) times daily as needed for muscle spasms. 60 tablet 1   DULoxetine (CYMBALTA) 60 MG capsule TAKE 1 CAPSULE BY MOUTH EVERY DAY 90 capsule 1   FARXIGA 10 MG TABS tablet Take 1 tablet (10 mg total) by mouth daily before breakfast. 30 tablet 11   ferrous fumarate (HEMOCYTE - 106 MG FE) 325 (106 Fe) MG TABS tablet Take 1 tablet (106 mg of iron total) by mouth 2 (two) times daily. Do not take at the same time as omeprazole (Prilosec) 60 tablet 6   ketoconazole (NIZORAL) 2 % cream Apply 1 application topically daily. (Patient taking differently: Apply 1 application  topically daily as needed.) 60 g 0   levothyroxine (SYNTHROID) 112 MCG tablet Take 1 tablet (112 mcg total) by mouth daily before breakfast. 90 tablet 1   metFORMIN (GLUCOPHAGE) 500 MG tablet Take 1 tablet (500 mg total) by mouth 2 (two) times daily with a meal. 180 tablet 0   omeprazole (PRILOSEC) 20 MG capsule TAKE 1 CAPSULE(20 MG) BY MOUTH TWICE DAILY BEFORE A MEAL 60 capsule 1   potassium chloride SA (KLOR-CON M) 20 MEQ tablet Take 1 tablet (20 mEq total) by mouth daily. 30 tablet 11   Rivaroxaban (XARELTO) 15 MG TABS tablet Take 1 tablet (15 mg total) by mouth daily with supper. 90 tablet 3   sacubitril-valsartan (ENTRESTO) 24-26 MG Take 1 tablet by mouth 2 (two) times daily. 60 tablet 6   spironolactone (ALDACTONE) 25 MG tablet Take 0.5 tablets (12.5 mg total) by mouth at bedtime. 90 tablet 3   torsemide (DEMADEX) 20 MG tablet Take 3 tablets (60 mg total) by mouth 2 (two) times daily. 540 tablet 3   triamcinolone (KENALOG) 0.025 % ointment Apply topically as needed.     No current facility-administered medications on file prior to visit.   Allergies  Allergen Reactions   Penicillins Swelling and Rash    Has patient had a PCN reaction causing immediate rash,  facial/tongue/throat swelling, SOB or lightheadedness with hypotension: No Has patient had a PCN reaction causing severe rash involving mucus membranes or skin necrosis: No Has patient had a PCN reaction that required hospitalization: No Has patient had a PCN reaction occurring within the last 10 years: No If all of the above answers are "NO", then may proceed with Cephalosporin use.   Family History  Problem Relation Age of Onset   Breast cancer Other        aunt    CAD Brother    Lung cancer Mother        F and M   Diabetes Father        F and mother    CAD Father    Lung cancer Father    Stroke Sister    Colon cancer Neg Hx    PE: BP 118/64   Pulse 92   Ht 5\' 5"  (1.651 m)   Wt 223 lb (101.2 kg)   SpO2 97%   BMI 37.11 kg/m   Wt Readings from Last 10 Encounters:  10/16/23 223 lb (101.2 kg)  09/29/23 227 lb  9.6 oz (103.2 kg)  06/19/23 220 lb 9.6 oz (100.1 kg)  03/08/23 217 lb 12.8 oz (98.8 kg)  10/19/22 217 lb 6.4 oz (98.6 kg)  10/11/22 217 lb 8 oz (98.7 kg)  06/13/22 220 lb (99.8 kg)  04/28/22 218 lb (98.9 kg)  04/11/22 217 lb (98.4 kg)  03/02/22 214 lb (97.1 kg)   Constitutional: overweight, in NAD Eyes: EOMI, no exophthalmos ENT: no thyromegaly, no cervical lymphadenopathy Cardiovascular: RRR, No MRG Respiratory: CTA B Musculoskeletal: no deformities Skin: No rashes Neurological: + tremor with outstretched hands  Assessment: 1.  Osteoporosis  2. Vitamin D deficiency -Reviewing her chart, there is no obvious reason for malabsorption: Celiac workup was negative, no colon diseases, she had a history of Barrett's esophagus and has surveillance EGDs and also had achalasia surgery  3. Hypothyroidism  PLAN: 1. Osteoporosis -Without history of fragility fractures.  She was investigated for hand fracture in 03/2020.  We discussed that this did not qualify as a fragility fracture -No falls or fractures since last visit -On her bone density report from 08/2018,  the lumbar spine T-score was low, at -2.8, but stable.  The right femoral neck BMD was lower than before, however, this bone density was obtained on another machine, so the T-scores were not directly comparable with the previous.  She finally had another DXA scan in 2024 and this showed that the level of the spine, the T-score was lower, at -3.3, however, this time L3 vertebra was excluded from analysis.  The bone density scan at the femoral necks were approximately stable. -She was previously on Evista for 4 years but we started alendronate in 08/2019.  She tolerates this well, without thigh/hip/jaw pain.  She is aware that she needs to take this before levothyroxine. -Will plan to continue alendronate at least 1 more year, until next DXA and then consider changing to another, more potent medication, if needed.  We also discussed about possibly reducing or even stopping the dose of her PPI-she will discuss with PCP.  I strongly advised her not to change to dose until she discusses with him. -We discussed about the importance of weightbearing exercise.  She is still recovering from hip surgery and has difficulty getting up from the chair-husband is helping her -Will check a vitamin D level today -I will see her back in a year  2.  Vitamin D insufficiency -She was previously on high-dose ergocalciferol once a week in the past, but currently on daily cholecalciferol -She continues 5000 units vitamin D3 daily -At last visit, vitamin D level was at goal, but higher in the normal range, at 62.8. -We will repeat the level today and see if we need to decrease the dose  3. Hypothyroidism - latest thyroid labs reviewed with pt. >> normal in 04/2023 - she continues on LT4 112 mcg daily - pt feels good on this dose. - we discussed about taking the thyroid hormone every day, with water, >30 minutes before breakfast, separated by >4 hours from acid reflux medications, calcium, iron, multivitamins. Pt. is taking  it correctly. - will check thyroid tests today: TSH and fT4 - If labs are abnormal, she will need to return for repeat TFTs in 1.5 months  Needs refills.  Orders Placed This Encounter  Procedures   TSH   T4, free   VITAMIN D 25 Hydroxy (Vit-D Deficiency, Fractures)   Carlus Pavlov, MD PhD [facility]

## 2023-10-16 NOTE — Patient Instructions (Signed)
 Please stop at the lab.  Continue Levothyroxine 112 mcg daily.  Take the thyroid hormone every day, with water, at least 30 minutes before breakfast, separated by at least 4 hours from: - acid reflux medications - calcium - iron - multivitamins  Continue: - Fosamax 70 mg weekly - vitamin D 5000 units daily  Please come back for a follow-up appointment in 1 year.

## 2023-10-17 ENCOUNTER — Other Ambulatory Visit: Payer: Self-pay | Admitting: Internal Medicine

## 2023-10-17 ENCOUNTER — Encounter: Payer: Self-pay | Admitting: Internal Medicine

## 2023-10-17 LAB — TSH: TSH: 2.69 m[IU]/L (ref 0.40–4.50)

## 2023-10-17 LAB — VITAMIN D 25 HYDROXY (VIT D DEFICIENCY, FRACTURES): Vit D, 25-Hydroxy: 51 ng/mL (ref 30–100)

## 2023-10-17 LAB — T4, FREE: Free T4: 1.4 ng/dL (ref 0.8–1.8)

## 2023-10-17 MED ORDER — LEVOTHYROXINE SODIUM 112 MCG PO TABS: 112.0000 ug | ORAL_TABLET | Freq: Every day | ORAL | 3 refills | Status: AC

## 2023-10-19 DIAGNOSIS — Z1231 Encounter for screening mammogram for malignant neoplasm of breast: Secondary | ICD-10-CM | POA: Diagnosis not present

## 2023-10-24 ENCOUNTER — Encounter: Payer: Self-pay | Admitting: Family Medicine

## 2023-10-27 ENCOUNTER — Telehealth: Payer: Self-pay

## 2023-10-27 NOTE — Telephone Encounter (Signed)
-----   Message from [facility] Gejetta A sent at 10/03/2023  1:48 PM EST ----- See Below

## 2023-10-27 NOTE — Telephone Encounter (Signed)
 Scheduled 3/20 @ 11am for an iron infusion. Patient will need to check in at the main entrance of the hospital entrance a. Iron infusion will last for an hour, if patient needs to reschedule she can call [phone]. No answer, Left message to return call.

## 2023-10-28 ENCOUNTER — Other Ambulatory Visit: Payer: Self-pay | Admitting: Internal Medicine

## 2023-10-28 DIAGNOSIS — M810 Age-related osteoporosis without current pathological fracture: Secondary | ICD-10-CM

## 2023-11-02 ENCOUNTER — Encounter (HOSPITAL_COMMUNITY)
Admission: RE | Admit: 2023-11-02 | Discharge: 2023-11-02 | Disposition: A | Discharge status: Home / Self Care | Source: Ambulatory Visit | Attending: Cardiology | Admitting: Cardiology

## 2023-11-02 DIAGNOSIS — Z950 Presence of cardiac pacemaker: Secondary | ICD-10-CM | POA: Insufficient documentation

## 2023-11-02 DIAGNOSIS — I5022 Chronic systolic (congestive) heart failure: Secondary | ICD-10-CM | POA: Diagnosis not present

## 2023-11-02 MED ORDER — SODIUM CHLORIDE 0.9 % IV SOLN
510.0000 mg | Freq: Once | INTRAVENOUS | Status: AC
  Administered 2023-11-02: 510 mg via INTRAVENOUS
  Filled 2023-11-02: qty 510

## 2023-11-07 NOTE — Progress Notes (Signed)
 Remote pacemaker transmission.

## 2023-11-13 ENCOUNTER — Encounter (INDEPENDENT_AMBULATORY_CARE_PROVIDER_SITE_OTHER): Payer: Medicare Other

## 2023-11-13 DIAGNOSIS — Z950 Presence of cardiac pacemaker: Secondary | ICD-10-CM | POA: Diagnosis not present

## 2023-11-13 DIAGNOSIS — I5022 Chronic systolic (congestive) heart failure: Secondary | ICD-10-CM | POA: Diagnosis not present

## 2023-11-16 ENCOUNTER — Telehealth: Payer: Self-pay

## 2023-11-16 NOTE — Telephone Encounter (Signed)
 Remote ICM transmission received.  Attempted call to patient regarding ICM remote transmission and left detailed message per DPR.  Left ICM phone number and advised to return call for any fluid symptoms or questions. Next ICM remote transmission scheduled 12/18/2023.

## 2023-11-16 NOTE — Progress Notes (Signed)
 EPIC Encounter for ICM Monitoring  Patient Name: Melinda Mckinney is a 68 y.o. female Date: 11/16/2023 Primary Care Physican: Jeralyn Bennett, MD Primary Cardiologist: Shirlee Latch Electrophysiologist: Rae Roam Pacing: 99.7%         08/08/2023 Weight: 220 lbs 08/29/2023 Weight: Unknown, has not weighed in the last couple of weeks 09/12/2023 Weight: 219 lbs    Time in AT/AF  24.0 hr/day (100.0%)           Attempted call to husband per DPR and unable to reach.  Left detailed message per DPR regarding transmission.  Transmission results reviewed.    Diet:  Does not follow low salt diet.  Typically eats restaurant breakfast every morning.   Optivol thoracic impedance suggesting normal fluid levels since 3/21.    Prescribed:  Torsemide 20 mg take 3 tablets (60 mg total) by mouth twice a day.  Potassium 20 mEq take 1 tablet daily  Spironolactone 25 mg take 0.5 tablet (12.5 mg total) by mouth at bedtime   Labs: 09/29/2023 Creatinine 1.99, BUN 32, Potassium 5.0, Sodium 140, GFR 27 09/19/2023 Creatinine 1.79, BUN 33, Potassium 4.1, Sodium 138, GFR 30 09/06/2023 Creatinine 1.66, BUN 32, Potassium 4.3, Sodium 140, GFR 33 08/02/2023 Creatinine 1.82, BUN 31, Potassium 3.6, Sodium 141, GFR 30 A complete set of results can be found in Results Review.   Recommendations:  Left voice mail with ICM number and encouraged to call if experiencing any fluid symptoms.   Follow-up plan: ICM clinic phone appointment on 12/18/2023.   91 day device clinic remote transmission 01/01/2024.     EP/Cardiology Office Visits:  Recall 12/28/2023 with Dr Shirlee Latch.    Recall 06/08/2023 with Dr Nelly Laurence.       Copy of ICM check sent to Dr. Ladona Ridgel.    3 month ICM trend: 11/13/2023.    12-14 Month ICM trend:     Karie Soda, RN 11/16/2023 2:02 PM

## 2023-11-16 NOTE — Telephone Encounter (Signed)
 Opened in error

## 2023-11-23 DIAGNOSIS — I2510 Atherosclerotic heart disease of native coronary artery without angina pectoris: Secondary | ICD-10-CM | POA: Diagnosis not present

## 2023-11-23 DIAGNOSIS — E785 Hyperlipidemia, unspecified: Secondary | ICD-10-CM | POA: Diagnosis not present

## 2023-11-23 DIAGNOSIS — M810 Age-related osteoporosis without current pathological fracture: Secondary | ICD-10-CM | POA: Diagnosis not present

## 2023-11-23 DIAGNOSIS — N1832 Chronic kidney disease, stage 3b: Secondary | ICD-10-CM | POA: Diagnosis not present

## 2023-11-23 DIAGNOSIS — Z1231 Encounter for screening mammogram for malignant neoplasm of breast: Secondary | ICD-10-CM | POA: Diagnosis not present

## 2023-11-23 DIAGNOSIS — E039 Hypothyroidism, unspecified: Secondary | ICD-10-CM | POA: Diagnosis not present

## 2023-11-23 DIAGNOSIS — N183 Chronic kidney disease, stage 3 unspecified: Secondary | ICD-10-CM | POA: Diagnosis not present

## 2023-11-23 DIAGNOSIS — E1122 Type 2 diabetes mellitus with diabetic chronic kidney disease: Secondary | ICD-10-CM | POA: Diagnosis not present

## 2023-11-23 DIAGNOSIS — I10 Essential (primary) hypertension: Secondary | ICD-10-CM | POA: Diagnosis not present

## 2023-11-27 ENCOUNTER — Other Ambulatory Visit (HOSPITAL_COMMUNITY): Payer: Self-pay

## 2023-11-27 DIAGNOSIS — I5022 Chronic systolic (congestive) heart failure: Secondary | ICD-10-CM

## 2023-11-27 MED ORDER — ENTRESTO 24-26 MG PO TABS
1.0000 | ORAL_TABLET | Freq: Two times a day (BID) | ORAL | 6 refills | Status: AC
Start: 2023-11-27 — End: ?

## 2023-12-15 DIAGNOSIS — N1832 Chronic kidney disease, stage 3b: Secondary | ICD-10-CM | POA: Diagnosis not present

## 2023-12-18 ENCOUNTER — Encounter: Attending: Internal Medicine

## 2023-12-18 DIAGNOSIS — Z950 Presence of cardiac pacemaker: Secondary | ICD-10-CM | POA: Diagnosis not present

## 2023-12-18 DIAGNOSIS — I5022 Chronic systolic (congestive) heart failure: Secondary | ICD-10-CM | POA: Insufficient documentation

## 2023-12-21 DIAGNOSIS — I129 Hypertensive chronic kidney disease with stage 1 through stage 4 chronic kidney disease, or unspecified chronic kidney disease: Secondary | ICD-10-CM | POA: Diagnosis not present

## 2023-12-21 DIAGNOSIS — Z905 Acquired absence of kidney: Secondary | ICD-10-CM | POA: Diagnosis not present

## 2023-12-21 DIAGNOSIS — N1832 Chronic kidney disease, stage 3b: Secondary | ICD-10-CM | POA: Diagnosis not present

## 2023-12-21 DIAGNOSIS — I5020 Unspecified systolic (congestive) heart failure: Secondary | ICD-10-CM | POA: Diagnosis not present

## 2023-12-21 DIAGNOSIS — E1122 Type 2 diabetes mellitus with diabetic chronic kidney disease: Secondary | ICD-10-CM | POA: Diagnosis not present

## 2023-12-21 DIAGNOSIS — D631 Anemia in chronic kidney disease: Secondary | ICD-10-CM | POA: Diagnosis not present

## 2023-12-21 DIAGNOSIS — N2581 Secondary hyperparathyroidism of renal origin: Secondary | ICD-10-CM | POA: Diagnosis not present

## 2023-12-21 NOTE — Progress Notes (Signed)
 EPIC Encounter for ICM Monitoring  Patient Name: Melinda Mckinney is a 68 y.o. female Date: 12/21/2023 Primary Care Physican: Zamora, Ezequiel, MD Primary Cardiologist: Mitzie Anda Electrophysiologist: Mealor Bi-V Pacing: 99.9%         08/08/2023 Weight: 220 lbs 08/29/2023 Weight: Unknown, has not weighed in the last couple of weeks 09/12/2023 Weight: 219 lbs    Time in AT/AF  24.0 hr/day (100.0%)            Spoke with husband and heart failure questions reviewed.  Transmission results reviewed.  He reports Pt is asymptomatic for fluid accumulation.  Reports feeling well at this time and voices no complaints.     Diet:  Does not follow low salt diet.  Typically eats restaurant breakfast every morning.   Optivol thoracic impedance suggesting normal fluid levels with the exception from possible fluid accumulation from 4/5-4/9, 4/12-4/17 and 4/19-4/26.    Prescribed:  Torsemide  20 mg take 3 tablets (60 mg total) by mouth twice a day.  Potassium 20 mEq take 1 tablet daily  Spironolactone  25 mg take 0.5 tablet (12.5 mg total) by mouth at bedtime   Labs: 11/23/2023 Creatinine 1.7,   BUN 44, Potassium 4.7, Sodium 139, GFR 32 09/29/2023 Creatinine 1.99, BUN 32, Potassium 5.0, Sodium 140, GFR 27 09/19/2023 Creatinine 1.79, BUN 33, Potassium 4.1, Sodium 138, GFR 30 09/06/2023 Creatinine 1.66, BUN 32, Potassium 4.3, Sodium 140, GFR 33 08/02/2023 Creatinine 1.82, BUN 31, Potassium 3.6, Sodium 141, GFR 30 A complete set of results can be found in Results Review.   Recommendations:  No changes and encouraged to call if experiencing any fluid symptoms.   Follow-up plan: ICM clinic phone appointment on 01/22/2024.   91 day device clinic remote transmission 01/01/2024.     EP/Cardiology Office Visits:  Recall 12/28/2023 with Dr Mitzie Anda.    Recall 06/08/2023 with Dr Arlester Ladd.       Copy of ICM check sent to Dr. Arlester Ladd.    3 month ICM trend: 12/18/2023.    12-14 Month ICM trend:     Almyra Jain,  RN 12/21/2023 10:21 AM

## 2023-12-28 ENCOUNTER — Ambulatory Visit: Attending: Cardiovascular Disease | Admitting: Cardiovascular Disease

## 2023-12-28 ENCOUNTER — Encounter: Payer: Self-pay | Admitting: Cardiovascular Disease

## 2023-12-28 VITALS — BP 120/74 | HR 61 | Ht 65.0 in | Wt 227.5 lb

## 2023-12-28 DIAGNOSIS — I4821 Permanent atrial fibrillation: Secondary | ICD-10-CM | POA: Diagnosis not present

## 2023-12-28 LAB — CUP PACEART INCLINIC DEVICE CHECK
Date Time Interrogation Session: 20250515164108
Implantable Lead Connection Status: 753985
Implantable Lead Connection Status: 753985
Implantable Lead Connection Status: 753985
Implantable Lead Implant Date: 20201109
Implantable Lead Implant Date: 20201109
Implantable Lead Implant Date: 20201109
Implantable Lead Location: 753858
Implantable Lead Location: 753859
Implantable Lead Location: 753860
Implantable Lead Model: 4598
Implantable Lead Model: 5076
Implantable Lead Model: 5076
Implantable Pulse Generator Implant Date: 20201109

## 2023-12-28 NOTE — Patient Instructions (Signed)
 Medication Instructions:  Your physician recommends that you continue on your current medications as directed. Please refer to the Current Medication list given to you today. *If you need a refill on your cardiac medications before your next appointment, please call your pharmacy*   Follow-Up: At [facility], you and your health needs are our priority.  As part of our continuing mission to provide you with exceptional heart care, our providers are all part of one team.  This team includes your primary Cardiologist (physician) and Advanced Practice Providers or APPs (Physician Assistants and Nurse Practitioners) who all work together to provide you with the care you need, when you need it.  Your next appointment:   1 year(s)  Provider:   Marlane Silver, MD

## 2023-12-28 NOTE — Progress Notes (Signed)
 Electrophysiology Office Note:    Date:  12/28/2023   ID:  Melinda Mckinney, DOB 06-02-1956, MRN [id number]  PCP:  Miguel Albino, MD   [facility] Providers Cardiologist:  Maudine Sos, MD Electrophysiologist:  Efraim Grange, MD     Referring MD: Miguel Albino, MD   History of Present Illness:    Melinda Mckinney is a 68 y.o. female with a medical history significant for atrial fibrillation, AV node ablation, status post Medtronic CRT-P CHFmrEF (45-50%), referred for device and arrhythmia follow-up.      Discussed the use of AI scribe software for clinical note transcription with the patient, who gave verbal consent to proceed.  History of Present Illness Melinda Mckinney is a 68 year old female with non-ischemic cardiomyopathy and atrial fibrillation who presents for follow-up of her cardiac condition.  She has a history of non-ischemic cardiomyopathy diagnosed in 2018, initially presenting with a slightly depressed ejection fraction (EF) of 45-50%. In November 2020, her EF significantly decreased to 25-30%, possibly due to COVID-19 or atrial fibrillation with rapid ventricular response. A cardiac MRI in November 2020 showed an EF of 34% without late gadolinium enhancement. She underwent AV node ablation and had a Medtronic CRTP device placed, which improved her EF to 55-60% by March 2022.  She has had persistent atrial fibrillation since at least September 2020. She experiences shortness of breath, especially when walking extensively, which has been a new level of shortness of breath for more than six months. She is currently taking Xarelto  and torsemide , with a regimen of three pills in the morning and three at night. Her fluid levels on the device fluctuate, and she is trying to eliminate salt from her diet. She uses a salsa from [place] that may contain some salt.  Her fluid levels have been noted to fluctuate, possibly related to salt intake. No other significant  problems aside from the shortness of breath.         Today, she reports that she is doing well and has no complaints  EKGs/Labs/Other Studies Reviewed Today:     Echocardiogram:  TTE August 2024 EF 55 to 60%.  Mildly dilated left atrium.  Aortic valve sclerosis without stenosis.     EKG:   EKG Interpretation Date/Time:  Thursday Dec 28 2023 16:16:15 EDT Ventricular Rate:  67 PR Interval:    QRS Duration:  120 QT Interval:  428 QTC Calculation: 452 R Axis:   -39  Text Interpretation: Ventricular-paced rhythm When compared with ECG of 08-Mar-2023 08:44, No change Confirmed by Marlane Silver [phone]) on 12/28/2023 5:36:28 PM     Physical Exam:    VS:  BP 120/74 (BP Location: Right Arm, Patient Position: Sitting, Cuff Size: Large)   Pulse 61   Ht 5\' 5"  (1.651 m)   Wt 227 lb 8 oz (103.2 kg)   SpO2 97%   BMI 37.86 kg/m     Wt Readings from Last 3 Encounters:  12/28/23 227 lb 8 oz (103.2 kg)  11/02/23 211 lb (95.7 kg)  10/16/23 223 lb (101.2 kg)     GEN:  Well nourished, well developed in no acute distress CARDIAC: RRR, no murmurs, rubs, gallops The device site is normal -- no tenderness, edema, drainage, redness, threatened erosion.  RESPIRATORY:  Normal work of breathing MUSCULOSKELETAL: no edema    ASSESSMENT & PLAN:     Medtronic BiV pacemaker 3-lead device programmed VVIR She is on 100% V paced She is device dependent today I reviewed today's  interrogation.  See Paceart for details  Permanent atrial fibrillation Status post AV node ablation Continue rivaroxaban  to 15 mg daily  CHF with recovered EF OptiVol slightly elevated today She may have had some increased salt intake recently due to some homemade coaster [place] and salsa I advised her to take an extra tablet of torsemide  tomorrow morning and tomorrow afternoon Continue carvedilol  6.25, Farxiga  10 mg, Entresto  24-26 mg, spironolactone  12.5 mg   Signed, Gavyn Zoss E Kilyn Maragh, MD  12/28/2023  5:37 PM    [facility]

## 2024-01-01 ENCOUNTER — Ambulatory Visit: Payer: Medicare Other

## 2024-01-01 DIAGNOSIS — I428 Other cardiomyopathies: Secondary | ICD-10-CM

## 2024-01-01 DIAGNOSIS — I4821 Permanent atrial fibrillation: Secondary | ICD-10-CM

## 2024-01-02 LAB — CUP PACEART REMOTE DEVICE CHECK
Battery Remaining Longevity: 32 mo
Battery Voltage: 2.92 V
Brady Statistic RA Percent Paced: 0 %
Brady Statistic RV Percent Paced: 99.79 %
Date Time Interrogation Session: 20250519035725
Implantable Lead Connection Status: 753985
Implantable Lead Connection Status: 753985
Implantable Lead Connection Status: 753985
Implantable Lead Implant Date: 20201109
Implantable Lead Implant Date: 20201109
Implantable Lead Implant Date: 20201109
Implantable Lead Location: 753858
Implantable Lead Location: 753859
Implantable Lead Location: 753860
Implantable Lead Model: 4598
Implantable Lead Model: 5076
Implantable Lead Model: 5076
Implantable Pulse Generator Implant Date: 20201109
Lead Channel Impedance Value: 1026 Ohm
Lead Channel Impedance Value: 1102 Ohm
Lead Channel Impedance Value: 1311 Ohm
Lead Channel Impedance Value: 1463 Ohm
Lead Channel Impedance Value: 1520 Ohm
Lead Channel Impedance Value: 380 Ohm
Lead Channel Impedance Value: 418 Ohm
Lead Channel Impedance Value: 456 Ohm
Lead Channel Impedance Value: 513 Ohm
Lead Channel Impedance Value: 551 Ohm
Lead Channel Impedance Value: 684 Ohm
Lead Channel Impedance Value: 760 Ohm
Lead Channel Impedance Value: 874 Ohm
Lead Channel Impedance Value: 988 Ohm
Lead Channel Pacing Threshold Amplitude: 0.625 V
Lead Channel Pacing Threshold Amplitude: 1.625 V
Lead Channel Pacing Threshold Pulse Width: 0.4 ms
Lead Channel Pacing Threshold Pulse Width: 0.8 ms
Lead Channel Sensing Intrinsic Amplitude: 0.875 mV
Lead Channel Sensing Intrinsic Amplitude: 0.875 mV
Lead Channel Sensing Intrinsic Amplitude: 4.125 mV
Lead Channel Sensing Intrinsic Amplitude: 9.75 mV
Lead Channel Setting Pacing Amplitude: 2.5 V
Lead Channel Setting Pacing Amplitude: 2.75 V
Lead Channel Setting Pacing Pulse Width: 0.4 ms
Lead Channel Setting Pacing Pulse Width: 0.8 ms
Lead Channel Setting Sensing Sensitivity: 2 mV
Zone Setting Status: 755011
Zone Setting Status: 755011

## 2024-01-11 ENCOUNTER — Ambulatory Visit: Payer: Self-pay | Admitting: Cardiovascular Disease

## 2024-01-22 ENCOUNTER — Encounter: Attending: Internal Medicine

## 2024-01-22 DIAGNOSIS — I5022 Chronic systolic (congestive) heart failure: Secondary | ICD-10-CM | POA: Insufficient documentation

## 2024-01-22 DIAGNOSIS — Z950 Presence of cardiac pacemaker: Secondary | ICD-10-CM | POA: Diagnosis not present

## 2024-01-23 DIAGNOSIS — K22719 Barrett's esophagus with dysplasia, unspecified: Secondary | ICD-10-CM | POA: Diagnosis not present

## 2024-01-23 DIAGNOSIS — I5020 Unspecified systolic (congestive) heart failure: Secondary | ICD-10-CM | POA: Diagnosis not present

## 2024-01-23 DIAGNOSIS — Z1211 Encounter for screening for malignant neoplasm of colon: Secondary | ICD-10-CM | POA: Diagnosis not present

## 2024-01-23 DIAGNOSIS — N1832 Chronic kidney disease, stage 3b: Secondary | ICD-10-CM | POA: Diagnosis not present

## 2024-01-23 MED ORDER — TORSEMIDE 20 MG PO TABS: ORAL_TABLET | ORAL | 2 refills | Status: AC

## 2024-01-23 NOTE — Progress Notes (Signed)
 Increase torsemide  to 80 qam/60 qpm with BMET in 1 week.

## 2024-01-23 NOTE — Progress Notes (Signed)
 Spoke with husband per DPR and advised to increase Torsemide  to 4 tablets (80 mg total) every morning and continue 3 tablets (60 mg total) every afternoon.   Advised lab work is needed in a week but they will be out of the country 6/15-6/24 and unable to get labs in a week.  Will check with Dr Mitzie Anda if labs can wait 2 weeks.    ICM recheck changed to 6/13 before patient leave country to see if fluid levels improving.

## 2024-01-23 NOTE — Progress Notes (Signed)
 EPIC Encounter for ICM Monitoring  Patient Name: Melinda Mckinney is a 68 y.o. female Date: 01/23/2024 Primary Care Physican: Zamora, Ezequiel, MD Primary Cardiologist: Mitzie Anda Electrophysiologist: Mealor Bi-V Pacing: 99.6%         08/08/2023 Weight: 220 lbs 08/29/2023 Weight: Unknown, has not weighed in the last couple of weeks 09/12/2023 Weight: 219 lbs  12/28/2023 Office Weight: 227 lbs 01/23/2024 Weight: 218 lbs   Time in AT/AF  24.0 hr/day (100.0%)            Spoke with husband and heart failure questions reviewed.  Transmission results reviewed.  Husband reports pt is asymptomatic for fluid accumulation.  Reports feeling well at this time and voices no complaints.     Diet:  Does not follow low salt diet.  Typically eats restaurant breakfast every morning.   Optivol thoracic impedance suggesting possible fluid accumulation starting 5/6.   Fluid index > normal threshold starting 5/13.  Dr Arlester Ladd advised pt to take extra Torsemide  tablet x 2 days at 5/15 OV.   Prescribed:  Torsemide  20 mg take 3 tablets (60 mg total) by mouth twice a day.  Potassium 20 mEq take 1 tablet daily  Spironolactone  25 mg take 0.5 tablet (12.5 mg total) by mouth at bedtime   Labs: 11/23/2023 Creatinine 1.7,   BUN 44, Potassium 4.7, Sodium 139, GFR 32 09/29/2023 Creatinine 1.99, BUN 32, Potassium 5.0, Sodium 140, GFR 27 09/19/2023 Creatinine 1.79, BUN 33, Potassium 4.1, Sodium 138, GFR 30 09/06/2023 Creatinine 1.66, BUN 32, Potassium 4.3, Sodium 140, GFR 33 08/02/2023 Creatinine 1.82, BUN 31, Potassium 3.6, Sodium 141, GFR 30 A complete set of results can be found in Results Review.   Recommendations:  Confirmed she takes Torsemide  60 mg bid and Potassium 20 daily.  Copy sent to Dr Mitzie Anda for review and recommendations if needed.     Follow-up plan: ICM clinic phone appointment on 01/29/2024 to recheck fluid levels.   91 day device clinic remote transmission 04/01/2024.     EP/Cardiology Office Visits:  Recall  12/28/2023 with Dr Mitzie Anda.    Recall 12/22/2024 with Dr Arlester Ladd.       Copy of ICM check sent to Dr. Arlester Ladd.    3 month ICM trend: 01/22/2024.    12-14 Month ICM trend:     Almyra Jain, RN 01/23/2024 7:38 AM

## 2024-01-23 NOTE — Progress Notes (Signed)
 Spoke with husband and advised Per Dr Mitzie Anda, BMET can be drawn in 2 weeks when patient returns from vacation.  Scheduled BMET at HF clinic 6/26.   Pt needs refill for Torsemide  and sent script to preferred pharmacy.

## 2024-01-26 ENCOUNTER — Encounter (INDEPENDENT_AMBULATORY_CARE_PROVIDER_SITE_OTHER)

## 2024-01-26 DIAGNOSIS — I5022 Chronic systolic (congestive) heart failure: Secondary | ICD-10-CM

## 2024-01-26 DIAGNOSIS — Z950 Presence of cardiac pacemaker: Secondary | ICD-10-CM

## 2024-01-26 NOTE — Progress Notes (Signed)
 Spoke with [facility] and was explained they did not have enough tablets on hand to fill the prescription but did order it.  They will notify her when the prescription is filled.

## 2024-01-26 NOTE — Progress Notes (Signed)
 Spoke with husband and explained the medication needed to be ordered due to the amount of tablets that were needed. Advised to check with [facility] when they return from their vacation in a week.  Also advised to check if she has enough tablets to take during her vacation and if not, call the pharmacy to ask for enough tablets to last for her vacation.  He verbalized understanding.  Reminded to have labs drawn on 6/26.

## 2024-01-26 NOTE — Progress Notes (Signed)
 EPIC Encounter for ICM Monitoring  Patient Name: Melinda Mckinney is a 68 y.o. female Date: 01/26/2024 Primary Care Physican: Zamora, Ezequiel, MD Primary Cardiologist: Mitzie Anda Electrophysiologist: Mealor Bi-V Pacing: 99.9%         08/08/2023 Weight: 220 lbs 08/29/2023 Weight: Unknown, has not weighed in the last couple of weeks 09/12/2023 Weight: 219 lbs  12/28/2023 Office Weight: 227 lbs 01/23/2024 Weight: 218 lbs   Time in AT/AF  24.0 hr/day (100.0%)            Spoke with husband and heart failure questions reviewed.  Transmission results reviewed.  Pt asymptomatic for fluid accumulation.  Checking for fluid level improvement before she goes out of the country on 6/15.    Diet:  Does not follow low salt diet.  Typically eats restaurant breakfast every morning.   Optivol thoracic impedance suggesting possible fluid accumulation starting 5/6 and continues but may need longer on the Torsemide  higher dosage, which was started on 6/10 to show improvement.   Fluid index > normal threshold starting 5/13.     Prescribed:  Torsemide  20 mg take 4 tablets (80 mg total) by mouth every morning and 3 tablets (60 mg total) every evening (increased 6/9).  Potassium 20 mEq take 1 tablet daily  Spironolactone  25 mg take 0.5 tablet (12.5 mg total) by mouth at bedtime   Labs: 02/08/2024 BMET Scheduled at HF clinic for f/u after Torsemide  dosage increase 11/23/2023 Creatinine 1.7,   BUN 44, Potassium 4.7, Sodium 139, GFR 32 09/29/2023 Creatinine 1.99, BUN 32, Potassium 5.0, Sodium 140, GFR 27 09/19/2023 Creatinine 1.79, BUN 33, Potassium 4.1, Sodium 138, GFR 30 09/06/2023 Creatinine 1.66, BUN 32, Potassium 4.3, Sodium 140, GFR 33 08/02/2023 Creatinine 1.82, BUN 31, Potassium 3.6, Sodium 141, GFR 30 A complete set of results can be found in Results Review.   Recommendations:  Continue to take Torsemide  as prescribed on 6/9 by Dr Mitzie Anda.  Pt was unable to fill Torsemide  prescription but did not know why.  She  has not started the higher morning dose yet.     Follow-up plan: ICM clinic phone appointment on 02/26/2024.   91 day device clinic remote transmission 04/01/2024.     EP/Cardiology Office Visits:  Recall 12/28/2023 with Dr Mitzie Anda.    Recall 12/22/2024 with Dr Arlester Ladd.       Copy of ICM check sent to Dr. Arlester Ladd.    3 month ICM trend: 01/26/2024.    12-14 Month ICM trend:     Almyra Jain, RN 01/26/2024 8:05 AM

## 2024-01-29 ENCOUNTER — Encounter

## 2024-02-08 ENCOUNTER — Ambulatory Visit (HOSPITAL_COMMUNITY)
Admission: RE | Admit: 2024-02-08 | Discharge: 2024-02-08 | Disposition: A | Discharge status: Home / Self Care | Source: Ambulatory Visit | Attending: Internal Medicine | Admitting: Internal Medicine

## 2024-02-08 ENCOUNTER — Ambulatory Visit (HOSPITAL_COMMUNITY): Payer: Self-pay | Admitting: Cardiology

## 2024-02-08 DIAGNOSIS — Z950 Presence of cardiac pacemaker: Secondary | ICD-10-CM | POA: Insufficient documentation

## 2024-02-08 DIAGNOSIS — I5022 Chronic systolic (congestive) heart failure: Secondary | ICD-10-CM | POA: Diagnosis not present

## 2024-02-08 LAB — BASIC METABOLIC PANEL WITH GFR
Anion gap: 14 (ref 5–15)
BUN: 33 mg/dL — ABNORMAL HIGH (ref 8–23)
CO2: 28 mmol/L (ref 22–32)
Calcium: 10 mg/dL (ref 8.9–10.3)
Chloride: 97 mmol/L — ABNORMAL LOW (ref 98–111)
Creatinine, Ser: 1.73 mg/dL — ABNORMAL HIGH (ref 0.44–1.00)
GFR, Estimated: 31 mL/min — ABNORMAL LOW (ref >60)
Glucose, Bld: 124 mg/dL — ABNORMAL HIGH (ref 70–99)
Potassium: 4.6 mmol/L (ref 3.5–5.1)
Sodium: 139 mmol/L (ref 135–145)

## 2024-02-15 NOTE — Progress Notes (Signed)
 Remote pacemaker transmission.

## 2024-02-15 NOTE — Addendum Note (Signed)
 Addended by: TAWNI DRILLING D on: 02/15/2024 01:32 PM   Modules accepted: Orders

## 2024-02-26 ENCOUNTER — Encounter: Attending: Internal Medicine

## 2024-02-26 DIAGNOSIS — Z950 Presence of cardiac pacemaker: Secondary | ICD-10-CM | POA: Diagnosis not present

## 2024-02-26 DIAGNOSIS — I5022 Chronic systolic (congestive) heart failure: Secondary | ICD-10-CM | POA: Insufficient documentation

## 2024-02-26 NOTE — Progress Notes (Signed)
 Increase torsemide  to 80 mg bid x 4 days then back to 80 qam/60 qpm. Cut back on sodium.

## 2024-02-26 NOTE — Progress Notes (Unsigned)
 EPIC Encounter for ICM Monitoring  Patient Name: Melinda Mckinney is a 68 y.o. female Date: 02/26/2024 Primary Care Physican: Zamora, Ezequiel, MD Primary Cardiologist: Rolan Electrophysiologist: Mealor Bi-V Pacing: 99.9%         08/08/2023 Weight: 220 lbs 08/29/2023 Weight: Unknown, has not weighed in the last couple of weeks 09/12/2023 Weight: 219 lbs  12/28/2023 Office Weight: 227 lbs 01/23/2024 Weight: 218 lbs 02/19/2024 Weight: 206 lbs (unsure of exact weight)   Time in AT/AF  24.0 hr/day (100.0%)            Spoke with son per DPR and heart failure questions reviewed.  Transmission results reviewed.  Pt asymptomatic for fluid accumulation.  She is feeling well.   Husband currently in the hospital for total knee replacement.   Diet:  Does not follow low salt diet.  Typically eats restaurant breakfast every morning.   Optivol thoracic impedance suggesting possible fluid accumulation starting 7/9.     Prescribed:  Torsemide  20 mg take 4 tablets (80 mg total) by mouth every morning and 3 tablets (60 mg total) every evening (increased 6/9).  Potassium 20 mEq take 1 tablet daily  Spironolactone  25 mg take 0.5 tablet (12.5 mg total) by mouth at bedtime   Labs: 02/08/2024 Creatinine 1.73, BUN 33, Potassium 4.6, Sodium 139, GFR 31 11/23/2023 Creatinine 1.7,   BUN 44, Potassium 4.7, Sodium 139, GFR 32 09/29/2023 Creatinine 1.99, BUN 32, Potassium 5.0, Sodium 140, GFR 27 09/19/2023 Creatinine 1.79, BUN 33, Potassium 4.1, Sodium 138, GFR 30 09/06/2023 Creatinine 1.66, BUN 32, Potassium 4.3, Sodium 140, GFR 33 08/02/2023 Creatinine 1.82, BUN 31, Potassium 3.6, Sodium 141, GFR 30 A complete set of results can be found in Results Review.   Recommendations:  Confirmed she is taking Torsemide  as prescribed.  Copy sent to Dr Rolan for review and recommendations if needed.    Follow-up plan: ICM clinic phone appointment on 03/04/2024 to recheck fluid levels.   91 day device clinic remote transmission  04/01/2024.     EP/Cardiology Office Visits:  Recall 12/28/2023 with Dr Rolan.    Recall 12/22/2024 with Dr Nancey.       Copy of ICM check sent to Dr. Nancey.    3 month ICM trend: 02/26/2024.    12-14 Month ICM trend:     Melinda GORMAN Garner, RN 02/26/2024 3:46 PM

## 2024-02-27 ENCOUNTER — Other Ambulatory Visit (HOSPITAL_COMMUNITY): Payer: Self-pay | Admitting: Cardiology

## 2024-02-27 DIAGNOSIS — I5022 Chronic systolic (congestive) heart failure: Secondary | ICD-10-CM

## 2024-02-27 MED ORDER — FARXIGA 10 MG PO TABS: 10.0000 mg | ORAL_TABLET | Freq: Every day | ORAL | 11 refills | Status: AC

## 2024-02-27 NOTE — Progress Notes (Signed)
 Spoke with patient and she stated her husband will be discharged from the hospital today after the total knee surgery.   She asked that I call her son Kashena Novitski, since her husband is not available, regarding the change in medication as recommended by Dr Rolan.

## 2024-02-27 NOTE — Progress Notes (Signed)
 Spoke with son, Melinda Mckinney, per [facility] and patient.  Advised of Dr Orvilla recommendations to take Torsemide  20 mg take 4 tablets (80 mg total) twice a day x 4 days.  After 4th day, then return to prescribed dosage of Torsemide  80 mg in the morning and 60 mg in the afternoon.  He verbalized understanding and will advise patient of the changes.  Also advised to restrict salt intake.   Explained will recheck her report next week and he asked to be called since his father is recovering from surgery.

## 2024-03-01 ENCOUNTER — Telehealth (HOSPITAL_COMMUNITY): Payer: Self-pay

## 2024-03-01 ENCOUNTER — Other Ambulatory Visit (HOSPITAL_COMMUNITY): Payer: Self-pay

## 2024-03-01 MED ORDER — RIVAROXABAN 15 MG PO TABS: 15.0000 mg | ORAL_TABLET | Freq: Every day | ORAL | 0 refills | Status: DC

## 2024-03-01 NOTE — Telephone Encounter (Signed)
 Called to confirm/remind patient of their appointment at the [facility] on 03/04/2024 3:30.   Appointment:   [] Confirmed  [x] Left mess   [] No answer/No voice mail  [] VM Full/unable to leave message  [] Phone not in service  Patient reminded to bring all medications and/or complete list.  Confirmed patient has transportation. Gave directions, instructed to utilize valet parking.

## 2024-03-04 ENCOUNTER — Encounter (HOSPITAL_COMMUNITY): Payer: Self-pay

## 2024-03-04 ENCOUNTER — Encounter (INDEPENDENT_AMBULATORY_CARE_PROVIDER_SITE_OTHER)

## 2024-03-04 ENCOUNTER — Ambulatory Visit (HOSPITAL_COMMUNITY)
Admission: RE | Admit: 2024-03-04 | Discharge: 2024-03-04 | Disposition: A | Discharge status: Home / Self Care | Source: Ambulatory Visit | Attending: Adult Health | Admitting: Adult Health

## 2024-03-04 VITALS — BP 90/50 | HR 77 | Wt 223.8 lb

## 2024-03-04 DIAGNOSIS — I340 Nonrheumatic mitral (valve) insufficiency: Secondary | ICD-10-CM | POA: Insufficient documentation

## 2024-03-04 DIAGNOSIS — Z950 Presence of cardiac pacemaker: Secondary | ICD-10-CM

## 2024-03-04 DIAGNOSIS — Z87891 Personal history of nicotine dependence: Secondary | ICD-10-CM | POA: Diagnosis not present

## 2024-03-04 DIAGNOSIS — N183 Chronic kidney disease, stage 3 unspecified: Secondary | ICD-10-CM | POA: Diagnosis not present

## 2024-03-04 DIAGNOSIS — I428 Other cardiomyopathies: Secondary | ICD-10-CM | POA: Insufficient documentation

## 2024-03-04 DIAGNOSIS — Z79899 Other long term (current) drug therapy: Secondary | ICD-10-CM | POA: Diagnosis not present

## 2024-03-04 DIAGNOSIS — Z7901 Long term (current) use of anticoagulants: Secondary | ICD-10-CM | POA: Diagnosis not present

## 2024-03-04 DIAGNOSIS — Z4509 Encounter for adjustment and management of other cardiac device: Secondary | ICD-10-CM | POA: Diagnosis not present

## 2024-03-04 DIAGNOSIS — I5022 Chronic systolic (congestive) heart failure: Secondary | ICD-10-CM | POA: Insufficient documentation

## 2024-03-04 DIAGNOSIS — I959 Hypotension, unspecified: Secondary | ICD-10-CM | POA: Insufficient documentation

## 2024-03-04 DIAGNOSIS — E039 Hypothyroidism, unspecified: Secondary | ICD-10-CM | POA: Insufficient documentation

## 2024-03-04 DIAGNOSIS — I4821 Permanent atrial fibrillation: Secondary | ICD-10-CM | POA: Diagnosis not present

## 2024-03-04 DIAGNOSIS — Z7984 Long term (current) use of oral hypoglycemic drugs: Secondary | ICD-10-CM | POA: Insufficient documentation

## 2024-03-04 DIAGNOSIS — I4819 Other persistent atrial fibrillation: Secondary | ICD-10-CM | POA: Insufficient documentation

## 2024-03-04 NOTE — Patient Instructions (Signed)
 GREAT TO SEE YOU TODAY  Follow-Up in: PLEASE CALL OUR OFFICE IN SEPTEMBER FOR AN OCTOBER APPOINTMENT WITH DR. ROLAN   At the [facility], you and your health needs are our priority. We have a designated team specialized in the treatment of Heart Failure. This Care Team includes your primary Heart Failure Specialized Cardiologist (physician), Advanced Practice Providers (APPs- Physician Assistants and Nurse Practitioners), and Pharmacist who all work together to provide you with the care you need, when you need it.   You may see any of the following providers on your designated Care Team at your next follow up:  Dr. Toribio Fuel Dr. Ezra ROLAN Dr. Ria Commander Dr. Odis Brownie Greig Mosses, NP Caffie Shed, [place] [facility] [place], [place] Beckey Coe, NP [place] Lee, NP Tinnie Redman, [name]   Please be sure to bring in all your medications bottles to every appointment.   Need to Contact Us :  If you have any questions or concerns before your next appointment please send us  a message through [place] or call our office at [phone].    TO LEAVE A MESSAGE FOR THE NURSE SELECT OPTION 2, PLEASE LEAVE A MESSAGE INCLUDING: YOUR NAME DATE OF BIRTH CALL BACK NUMBER REASON FOR CALL**this is important as we prioritize the call backs  YOU WILL RECEIVE A CALL BACK THE SAME DAY AS LONG AS YOU CALL BEFORE 4:00 PM

## 2024-03-04 NOTE — Progress Notes (Signed)
 PCP: Valdemar Anis, MD  Nephrology: Dr. Dennise HF Cardiology: Dr. Rolan  68 y.o. with CAD s/p PCI in 2010, chronic combined systolic and diastolic HF (EF 54-49% in 2018), atrial fibrillation on chronic anticoagulation w/ Xarelto , HTN, and hypothyroidism was diagnosed w/ COVID-19 in Sep 2020 and treated at [facility]. COVID infection c/b PNA. She was discharged and readmitted back to [facility] 10/9-10/13/20 for gastroenteritis, felt to be a sequela of COVID-19. She was managed w/ supportive care and discharged home. Had f/u with PCP 10/22 and endorsed worsening LLQ pain leading to abdominal CT which showed acute diverticulitis. She was started on outpatient antibiotics, cipro  + flagyl  but symptoms failed to improve, prompting her to seek emergency medical care at [facility].  Initial EKG in the ED showed afib w/ RVR for which general cardiology was consulted. She was started on IV dilt for rate control but later required treatment w/ IV amiodarone  due to persistent rapid ventricular response. Hospital course c/b volume overload, requiring IV diuretics. 2D echo was obtained and showed worsening LVEF, now 25-30% w/ moderate MR. She underwent subsequent R/LHC in 11/20 which demonstrated widely patent coronaries. RHC showed elevated mean RA pressure at 22, PWP 43, LVEDP 45 and low CO. CI by Fick 1.6. She underwent TEE-guided DCCV, TEE showed EF 15% with moderate RV dysfunction and moderate MR.  She went back into rapid atrial fibrillation.  She failed additional cardioversions.  Despite amiodarone  gtt, HR remained in the 140s-150s.  Finally, she underwent AV nodal ablation with BiV pacing due to inability to control atrial fibrillation. While in the hospital, she was on milrinone  gtt for a period of time, but we were able to taper it off. She was discharged home after medication optimization.   Echo in 2/21 showed EF up to 40-45%, mild diffuse hypokinesis, RV low normal function.  Echo in 3/22 showed EF  55-60%, RV normal, severe LAE.    Echo 8/24 showed EF 55-60%  S/p right hip replacement 8/24.  Today she returns for HF follow up.Overall feeling fine. Limited by knee pain. Denies SOB/PND/Orthopnea. Appetite ok. No fever or chills. Taking all medications.   Device interrogation (personally reviewed): No VT/Afib. Optivol looks ok. Activity ~ 4.6 hours.   Labs (11/20): digoxin  level 1.4, creatinine 1.81, K 4.8, hgb 13.9 Labs (1/21): LDL 91, K 3.7, creatinine 8.67 Labs (2/21): K 5.1, creatinine 1.37, TSH normal, digoxin  0.4 Labs (5/21): LDL 68, digoxin  0.3 Labs (6/21): K 4.4, creatinine 1.21 Labs (1/22): K 4, creatinine 1.65, TSH normal Labs (7/22): LDL 78, K 4.3, creatinine 8.15 Labs (11/22): K 4.8, creatinine 2.4 Labs (3/23): hgb 11.1 Labs (6/23): K 4.6, creatinine 1.87 Labs (2/24): LDL 59 Labs (7/24): K 3.7, creatinine 1.68 Labs (9/24): K 4.7, creatinine 1.78  PMH: 1. Hypothyroidism 2. Single kidney s/p nephrectomy 3. Atrial fibrillation: Initially diagnosed in 2018.  She was on sotalol  at one point to maintain NSR.  - Uncontrollable atrial fibrillation during 11/20 admission, patient eventually had AV nodal ablation with CRT-P implantation.  4. H/o achalasia 5. GERD 6. Fibromyalgia 7. H/o diverticulitis in 10/20 8. COVID-19 in 9/20.  9. H/o cervical cancer: cryotherapy.  10. Degenerative disc disease.  11. Chronic systolic CHF: Nonischemic cardiomyopathy, possibly tachycardia-mediated in setting of uncontrolled atrial fibrillation. Medtronic CRT-P device.  - Echo (2018): EF 45-50%.  - Echo (11/20): EF 25-30%, mildly decreased RV systolic function, moderate MR.  - TEE (11/20): EF 15%, moderately decreased RV systolic function, moderate MR.  - Cardiac MRI (11/20):  EF 34%, RV EF 30%, no LGE (done after AV nodal ablation and BiV pacing).  - RHC (11/20): mean RA 22, mean PCWP 43, LVEDP 45, CI 1.6 (Fick) and 2.7 (thermodilution).  - Echo (2/21): EF 40-45%, mild diffuse  hypokinesis, low normal RV systolic function.  - Echo (5/22): EF 55-60%, RV normal, severe LAE - Echo (8/24): EF 55-60%, RV normal 12. CAD: PCI in 2010.  - LHC (11/20): No significant coronary disease.  13. Avascular necrosis left hip - s/p L THR 8/24  Social History   Socioeconomic History   Marital status: Married    Spouse name: Not on file   Number of children: 3   Years of education: Not on file   Highest education level: Not on file  Occupational History   Occupation: stay home   Tobacco Use   Smoking status: Former    Current packs/day: 0.00    Average packs/day: 2.0 packs/day for 28.0 years (56.0 ttl pk-yrs)    Types: Cigarettes    Start date: 69    Quit date: 2000    Years since quitting: 25.5   Smokeless tobacco: Never  Vaping Use   Vaping status: Never Used  Substance and Sexual Activity   Alcohol use: Yes    Alcohol/week: 5.0 standard drinks of alcohol    Types: 5 Cans of beer per week    Comment: 07/27/2017 3-4 drinks//month   Drug use: No   Sexual activity: Not Currently    Comment: 1ST INTERCOURSE- 16, PARTNERS - 2  Other Topics Concern   Not on file  Social History Narrative   Original from [place]   3 children, 2 alive   Household --pt and husband    Social Drivers of Corporate investment banker Strain: Low Risk  (11/29/2021)   Overall Financial Resource Strain (CARDIA)    Difficulty of Paying Living Expenses: Not hard at all  Food Insecurity: Low Risk  (03/30/2023)   Received from [facility]   Hunger Vital Sign    Within the past 12 months, you worried that your food would run out before you got money to buy more: Never true    Within the past 12 months, the food you bought just didn't last and you didn't have money to get more. : Never true  Transportation Needs: Not on file (03/30/2023)  Physical Activity: Inactive (11/29/2021)   Exercise Vital Sign    Days of Exercise per Week: 0 days    Minutes of Exercise per Session: 0 min   Stress: No Stress Concern Present (11/29/2021)   [employer] of Occupational Health - Occupational Stress Questionnaire    Feeling of Stress : Not at all  Social Connections: Moderately Integrated (11/29/2021)   Social Connection and Isolation Panel    Frequency of Communication with Friends and Family: More than three times a week    Frequency of Social Gatherings with Friends and Family: More than three times a week    Attends Religious Services: Never    Database administrator or Organizations: Yes    Attends Engineer, structural: More than 4 times per year    Marital Status: Married  Catering manager Violence: Not At Risk (11/29/2021)   Humiliation, Afraid, Rape, and Kick questionnaire    Fear of Current or Ex-Partner: No    Emotionally Abused: No    Physically Abused: No    Sexually Abused: No   Family History  Problem Relation Age of Onset  Breast cancer Other        aunt    CAD Brother    Lung cancer Mother        F and M   Diabetes Father        F and mother    CAD Father    Lung cancer Father    Stroke Sister    Colon cancer Neg Hx    ROS: All systems reviewed and negative except as per HPI.   Current Outpatient Medications  Medication Sig Dispense Refill   alendronate  (FOSAMAX ) 70 MG tablet TAKE 1 TABLET BY MOUTH ONCE A WEEK  ON TUESDAYS ON AN EMPTY STOMACH BEFORE BREAKFAST. REMAIN UPRIGHT FOR 30 MINUTES AND TAKE WITH A FULL GLASS OF WATER 12 tablet 1   allopurinol  (ZYLOPRIM ) 100 MG tablet Take 1 tablet (100 mg total) by mouth daily. 90 tablet 1   atorvastatin  (LIPITOR) 40 MG tablet Take 1.5 tablets (60 mg total) by mouth at bedtime. 135 tablet 1   carvedilol  (COREG ) 6.25 MG tablet Take 1 tablet (6.25 mg total) by mouth 2 (two) times daily with a meal. 180 tablet 3   Cholecalciferol  (VITAMIN D3) 5000 UNITS CAPS Take 5,000 Units by mouth daily.      clobetasol  cream (TEMOVATE ) 0.05 % Apply topically 2 (two) times daily as needed. 60 g 1    cyclobenzaprine  (FLEXERIL ) 10 MG tablet Take 1 tablet (10 mg total) by mouth 2 (two) times daily as needed for muscle spasms. 60 tablet 1   DULoxetine  (CYMBALTA ) 60 MG capsule TAKE 1 CAPSULE BY MOUTH EVERY DAY 90 capsule 1   FARXIGA  10 MG TABS tablet Take 1 tablet (10 mg total) by mouth daily before breakfast. 30 tablet 11   ferrous fumarate  (HEMOCYTE - 106 MG FE) 325 (106 Fe) MG TABS tablet Take 1 tablet (106 mg of iron total) by mouth 2 (two) times daily. Do not take at the same time as omeprazole  (Prilosec) 60 tablet 6   ketoconazole  (NIZORAL ) 2 % cream Apply 1 application topically daily. 60 g 0   levothyroxine  (SYNTHROID ) 112 MCG tablet Take 1 tablet (112 mcg total) by mouth daily before breakfast. 90 tablet 3   metFORMIN  (GLUCOPHAGE ) 500 MG tablet Take 1 tablet (500 mg total) by mouth 2 (two) times daily with a meal. 180 tablet 0   omeprazole  (PRILOSEC) 20 MG capsule TAKE 1 CAPSULE(20 MG) BY MOUTH TWICE DAILY BEFORE A MEAL 60 capsule 1   potassium chloride  SA (KLOR-CON  M) 20 MEQ tablet Take 1 tablet (20 mEq total) by mouth daily. 30 tablet 11   Rivaroxaban  (XARELTO ) 15 MG TABS tablet Take 1 tablet (15 mg total) by mouth daily with supper. 90 tablet 0   sacubitril -valsartan  (ENTRESTO ) 24-26 MG Take 1 tablet by mouth 2 (two) times daily. 60 tablet 6   spironolactone  (ALDACTONE ) 25 MG tablet Take 0.5 tablets (12.5 mg total) by mouth at bedtime. 90 tablet 3   torsemide  (DEMADEX ) 20 MG tablet Take 4 tablets (80 mg total) by mouth every morning AND 3 tablets (60 mg total) every evening. 630 tablet 2   triamcinolone  (KENALOG) 0.025 % ointment Apply topically as needed.     No current facility-administered medications for this encounter.   Wt Readings from Last 3 Encounters:  03/04/24 101.5 kg (223 lb 12.8 oz)  12/28/23 103.2 kg (227 lb 8 oz)  11/02/23 95.7 kg (211 lb)   BP (!) 90/50   Pulse 77   Wt 101.5 kg (223 lb 12.8  oz)   SpO2 98%   BMI 37.24 kg/m  General:   No resp difficulty Neck:  no JVD.  Cor: Regular rate & rhythm. Lungs: clear Abdomen: soft, nontender, nondistended.  Extremities: no  edema Neuro: alert & oriented x3  Assessment/Plan: 1. Chronic systolic CHF:  Nonischemic cardiomyopathy.  Echo in 2018 with EF 45-50%.  Echo in 11/20 with EF 25-30%, diffuse hypokinesis, mildly decreased RV systolic function, moderate MR. LHC/RHC in 11/20 with no significant coronary disease, markedly elevated filling pressures and low cardiac output (CI 1.6).  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 Cardiac MRI 11/20 showed LVEF 34%, RVEF 30%, no LGE => perhaps pointing to tachy-mediated CMP as more likely etiology.  Initially required inotropic support w/ milrinone  but able to titrate off.  Now s/p AV nodal ablation with MDT CRT-P.  Echo in 2/21 showed EF up to 40-45%, echo in 3/22 with EF up to 55-60%.  Echo 8/24 EF 55-60%.  I reviewed and discussed Medtronic Interrogation.  NYHA II. Appears euvolemic. Continue torsemide  80 mg/60mg  daily  - Continue Entresto  to 24/26 bid with hypotension.  - Continue spironolactone  12.5 mg at bedtime. - Continue dapagliflozin  10 mg daily.  - Continue Coreg  6.25 mg bid.    2. Atrial fibrillation: Persistent atrial fibrillation, it appears, at least since 9/20 when she was admitted with COVID-19. Prior, she had paroxysmal atrial fibrillation and was maintained on sotalol . She is now off sotalol .  Unable to cardiovert or rate control atrial fibrillation. Therefore, she had AV nodal ablation with BiV pacing.  - Remains in A Fib. No bleeding issues.  - Continue Xarelto  15 mg daily. No bleeding issues. 3. Mitral regurgitation: Functional MR, moderate on TEE.  Rate was controlled on cardiac MRI, MR was less impressive.  Minimal MR on subsequent echoes.  4. CKD stage 3: Baseline SCr 1.5-1.8. - She is on Farxiga .  I reviewed BMET from 02/08/24.     Follow up in 3 months with Dr Rolan.    Aliscia Clayton NP-C 03/04/2024

## 2024-03-06 NOTE — Progress Notes (Signed)
 EPIC Encounter for ICM Monitoring  Patient Name: Melinda Mckinney is a 68 y.o. female Date: 03/06/2024 Primary Care Physican: Zamora, Ezequiel, MD Primary Cardiologist: Rolan Electrophysiologist: Mealor Bi-V Pacing: 99.7%         08/08/2023 Weight: 220 lbs 08/29/2023 Weight: Unknown, has not weighed in the last couple of weeks 09/12/2023 Weight: 219 lbs  12/28/2023 Office Weight: 227 lbs 01/23/2024 Weight: 218 lbs 02/19/2024 Weight: 206 lbs (unsure of exact weight)   Time in AT/AF  24.0 hr/day (100.0%)            Pt seen in HF clinic on 03/04/2024.   Diet:  Does not follow low salt diet.  Typically eats restaurant breakfast every morning.   Optivol thoracic impedance suggesting fluid levels returned close to normal.     Prescribed:  Torsemide  20 mg take 4 tablets (80 mg total) by mouth every morning and 3 tablets (60 mg total) every evening (increased 6/9).  Potassium 20 mEq take 1 tablet daily  Spironolactone  25 mg take 0.5 tablet (12.5 mg total) by mouth at bedtime   Labs: 02/08/2024 Creatinine 1.73, BUN 33, Potassium 4.6, Sodium 139, GFR 31 11/23/2023 Creatinine 1.7,   BUN 44, Potassium 4.7, Sodium 139, GFR 32 09/29/2023 Creatinine 1.99, BUN 32, Potassium 5.0, Sodium 140, GFR 27 09/19/2023 Creatinine 1.79, BUN 33, Potassium 4.1, Sodium 138, GFR 30 09/06/2023 Creatinine 1.66, BUN 32, Potassium 4.3, Sodium 140, GFR 33 08/02/2023 Creatinine 1.82, BUN 31, Potassium 3.6, Sodium 141, GFR 30 A complete set of results can be found in Results Review.   Recommendations:  Any recommendations given at 03/04/2024 HF clinic visit.     Follow-up plan: ICM clinic phone appointment on 04/02/2024.   91 day device clinic remote transmission 04/01/2024.     EP/Cardiology Office Visits:  Recall 06/02/2024 with Dr Rolan.    Recall 12/22/2024 with Dr Nancey.       Copy of ICM check sent to Dr. Nancey.    3 month ICM trend: 03/04/2024.    12-14 Month ICM trend:     Melinda GORMAN Garner,  RN 03/06/2024 10:42 AM

## 2024-03-21 ENCOUNTER — Encounter: Payer: Self-pay | Admitting: Cardiology

## 2024-03-25 DIAGNOSIS — H52221 Regular astigmatism, right eye: Secondary | ICD-10-CM | POA: Diagnosis not present

## 2024-03-25 DIAGNOSIS — H5202 Hypermetropia, left eye: Secondary | ICD-10-CM | POA: Diagnosis not present

## 2024-03-25 DIAGNOSIS — E119 Type 2 diabetes mellitus without complications: Secondary | ICD-10-CM | POA: Diagnosis not present

## 2024-03-25 DIAGNOSIS — H2513 Age-related nuclear cataract, bilateral: Secondary | ICD-10-CM | POA: Diagnosis not present

## 2024-03-25 DIAGNOSIS — H524 Presbyopia: Secondary | ICD-10-CM | POA: Diagnosis not present

## 2024-03-25 DIAGNOSIS — H353131 Nonexudative age-related macular degeneration, bilateral, early dry stage: Secondary | ICD-10-CM | POA: Diagnosis not present

## 2024-03-25 DIAGNOSIS — D23111 Other benign neoplasm of skin of right upper eyelid, including canthus: Secondary | ICD-10-CM | POA: Diagnosis not present

## 2024-04-01 ENCOUNTER — Ambulatory Visit (INDEPENDENT_AMBULATORY_CARE_PROVIDER_SITE_OTHER): Payer: Medicare Other

## 2024-04-01 DIAGNOSIS — I4821 Permanent atrial fibrillation: Secondary | ICD-10-CM

## 2024-04-02 ENCOUNTER — Encounter: Attending: Internal Medicine

## 2024-04-02 DIAGNOSIS — I5022 Chronic systolic (congestive) heart failure: Secondary | ICD-10-CM | POA: Insufficient documentation

## 2024-04-02 DIAGNOSIS — Z950 Presence of cardiac pacemaker: Secondary | ICD-10-CM | POA: Insufficient documentation

## 2024-04-03 LAB — CUP PACEART REMOTE DEVICE CHECK
Battery Remaining Longevity: 27 mo
Battery Voltage: 2.92 V
Brady Statistic RA Percent Paced: 0 %
Brady Statistic RV Percent Paced: 99.33 %
Date Time Interrogation Session: 20250817211421
Implantable Lead Connection Status: 753985
Implantable Lead Connection Status: 753985
Implantable Lead Connection Status: 753985
Implantable Lead Implant Date: 20201109
Implantable Lead Implant Date: 20201109
Implantable Lead Implant Date: 20201109
Implantable Lead Location: 753858
Implantable Lead Location: 753859
Implantable Lead Location: 753860
Implantable Lead Model: 4598
Implantable Lead Model: 5076
Implantable Lead Model: 5076
Implantable Pulse Generator Implant Date: 20201109
Lead Channel Impedance Value: 1159 Ohm
Lead Channel Impedance Value: 1159 Ohm
Lead Channel Impedance Value: 1330 Ohm
Lead Channel Impedance Value: 1501 Ohm
Lead Channel Impedance Value: 1577 Ohm
Lead Channel Impedance Value: 380 Ohm
Lead Channel Impedance Value: 475 Ohm
Lead Channel Impedance Value: 513 Ohm
Lead Channel Impedance Value: 513 Ohm
Lead Channel Impedance Value: 532 Ohm
Lead Channel Impedance Value: 760 Ohm
Lead Channel Impedance Value: 798 Ohm
Lead Channel Impedance Value: 912 Ohm
Lead Channel Impedance Value: 931 Ohm
Lead Channel Pacing Threshold Amplitude: 0.75 V
Lead Channel Pacing Threshold Amplitude: 1.625 V
Lead Channel Pacing Threshold Pulse Width: 0.4 ms
Lead Channel Pacing Threshold Pulse Width: 0.8 ms
Lead Channel Sensing Intrinsic Amplitude: 1.125 mV
Lead Channel Sensing Intrinsic Amplitude: 1.125 mV
Lead Channel Sensing Intrinsic Amplitude: 6.5 mV
Lead Channel Sensing Intrinsic Amplitude: 6.5 mV
Lead Channel Setting Pacing Amplitude: 2.5 V
Lead Channel Setting Pacing Amplitude: 2.75 V
Lead Channel Setting Pacing Pulse Width: 0.4 ms
Lead Channel Setting Pacing Pulse Width: 0.8 ms
Lead Channel Setting Sensing Sensitivity: 2 mV
Zone Setting Status: 755011
Zone Setting Status: 755011

## 2024-04-08 ENCOUNTER — Ambulatory Visit: Payer: Self-pay | Admitting: Cardiovascular Disease

## 2024-04-08 NOTE — Progress Notes (Signed)
 EPIC Encounter for ICM Monitoring  Patient Name: Melinda Mckinney is a 68 y.o. female Date: 04/08/2024 Primary Care Physican: Zamora, Ezequiel, MD Primary Cardiologist: Rolan Electrophysiologist: Mealor Bi-V Pacing: 100%         08/08/2023 Weight: 220 lbs 08/29/2023 Weight: Unknown, has not weighed in the last couple of weeks 09/12/2023 Weight: 219 lbs  12/28/2023 Office Weight: 227 lbs 01/23/2024 Weight: 218 lbs 02/19/2024 Weight: 206 lbs (unsure of exact weight)   Time in AT/AF  24.0 hr/day (100.0%)            Transmission results reviewed.     Diet:  Does not follow low salt diet.  Typically eats restaurant breakfast every morning.   Optivol thoracic impedance suggesting intermittent days with possible fluid accumulation within the last month.     Prescribed:  Torsemide  20 mg take 4 tablets (80 mg total) by mouth every morning and 3 tablets (60 mg total) every evening (increased 6/9).  Potassium 20 mEq take 1 tablet daily  Spironolactone  25 mg take 0.5 tablet (12.5 mg total) by mouth at bedtime   Labs: 02/08/2024 Creatinine 1.73, BUN 33, Potassium 4.6, Sodium 139, GFR 31 11/23/2023 Creatinine 1.7,   BUN 44, Potassium 4.7, Sodium 139, GFR 32 09/29/2023 Creatinine 1.99, BUN 32, Potassium 5.0, Sodium 140, GFR 27 A complete set of results can be found in Results Review.   Recommendations:  No changes.     Follow-up plan: ICM clinic phone appointment on 05/06/2024.   91 day device clinic remote transmission 07/01/2024.     EP/Cardiology Office Visits:  Recall 06/02/2024 with Dr Rolan.    Recall 12/22/2024 with Dr Nancey.       Copy of ICM check sent to Dr. Nancey.    3 month ICM trend: 04/01/2024.    12-14 Month ICM trend:     Melinda GORMAN Garner, RN 04/08/2024 10:42 AM

## 2024-04-11 ENCOUNTER — Other Ambulatory Visit (INDEPENDENT_AMBULATORY_CARE_PROVIDER_SITE_OTHER)

## 2024-04-11 ENCOUNTER — Ambulatory Visit (INDEPENDENT_AMBULATORY_CARE_PROVIDER_SITE_OTHER): Admitting: Orthopaedic Surgery

## 2024-04-11 DIAGNOSIS — M654 Radial styloid tenosynovitis [de Quervain]: Secondary | ICD-10-CM

## 2024-04-11 DIAGNOSIS — M25532 Pain in left wrist: Secondary | ICD-10-CM

## 2024-04-11 DIAGNOSIS — M65932 Unspecified synovitis and tenosynovitis, left forearm: Secondary | ICD-10-CM

## 2024-04-11 MED ORDER — METHYLPREDNISOLONE 4 MG PO TBPK: ORAL_TABLET | ORAL | 0 refills | Status: AC

## 2024-04-11 NOTE — Progress Notes (Signed)
 Office Visit Note   Patient: Melinda Mckinney           Date of Birth: 1956-01-24           MRN: [id number] Visit Date: 04/11/2024              Requested by: Valdemar Anis, MD [address] [place],  [place] [address] PCP: Valdemar Anis, MD   Assessment & Plan: Visit Diagnoses:  1. Extensor tenosynovitis of left wrist   2. De Quervain's disease (tenosynovitis)     Plan: History of Present Illness Melinda Mckinney is a 68 year old female with Dequervain's tenosynovitis who presents with persistent left wrist pain.  The wrist pain began in February and was initially managed with a corticosteroid injection, which provided only temporary relief. The pain has returned and is localized to the wrist, with tenderness and exacerbation upon certain movements. She is currently using a brace for support, but pain management remains difficult.  Her medical history includes borderline diabetes and atrial fibrillation, for which she is on Xarelto . She also has a pacemaker, placed during a hospitalization for COVID-19.  Physical Exam MUSCULOSKELETAL: Tenderness at 1st dorsal compartment. Pain on Finkelstein's test. [facility] grind test negative.  Assessment and Plan Left wrist de Quervain's tenosynovitis Chronic condition with persistent pain unresponsive to conservative management. Surgical intervention indicated tenolysis of 1st dorsal wrist compartment. Requires cardiology clearance due to pacemaker and Xarelto  use. - Obtain cardiology clearance from Dr. Rolan. - Instruct to continue wrist brace use. - Prescribe steroid for pain management, sent to [facility], [place]. - Provide cardiology consultation clearance sheet.  Total face to face encounter time was greater than 25 minutes and over half of this time was spent in counseling and/or coordination of care.  Follow-Up Instructions: No follow-ups on file.   Orders:  Orders Placed This Encounter  Procedures   XR Wrist Complete Left    Meds ordered this encounter  Medications   methylPREDNISolone  (MEDROL  DOSEPAK) 4 MG TBPK tablet    Sig: Take as directed    Dispense:  21 tablet    Refill:  0     Subjective: Chief Complaint  Patient presents with   Left Wrist - Pain    HPI  Review of Systems  Constitutional: Negative.   HENT: Negative.    Eyes: Negative.   Respiratory: Negative.    Cardiovascular: Negative.   Endocrine: Negative.   Musculoskeletal: Negative.   Neurological: Negative.   Hematological: Negative.   Psychiatric/Behavioral: Negative.    All other systems reviewed and are negative.    Objective: Vital Signs: There were no vitals taken for this visit.  Physical Exam Vitals and nursing note reviewed.  Constitutional:      Appearance: She is well-developed.  HENT:     Head: Atraumatic.     Nose: Nose normal.  Eyes:     Extraocular Movements: Extraocular movements intact.  Cardiovascular:     Pulses: Normal pulses.  Pulmonary:     Effort: Pulmonary effort is normal.  Abdominal:     Palpations: Abdomen is soft.  Musculoskeletal:     Cervical back: Neck supple.  Skin:    General: Skin is warm.     Capillary Refill: Capillary refill takes less than 2 seconds.  Neurological:     Mental Status: She is alert. Mental status is at baseline.  Psychiatric:        Behavior: Behavior normal.        Thought Content: Thought content  normal.        Judgment: Judgment normal.     Ortho Exam  Specialty Comments:  No specialty comments available.  Imaging: XR Wrist Complete Left Result Date: 04/11/2024 X-rays of the left wrist show no acute or structural abnormalities.    PMFS History: Patient Active Problem List   Diagnosis Date Noted   Complete heart block (HCC) 02/05/2020   Pacemaker 02/05/2020   Permanent atrial fibrillation (HCC) 02/05/2020   Non-ischemic cardiomyopathy (HCC) 07/10/2019   S/P ICD (internal cardiac defibrillator) procedure    Pneumonia due to COVID-19  virus 05/25/2019   Encounter for monitoring sotalol  therapy 08/03/2017   Acute systolic (congestive) heart failure (HCC) 07/27/2017   Atrial fibrillation with RVR (HCC) 06/29/2017   PCP NOTES >>>> 06/16/2015   Back pain 02/05/2015   Hypothyroidism 01/19/2015   Vitamin D  deficiency    Vulvar atrophy 08/20/2014   Annual physical exam 07/24/2014   Anemia 07/24/2014   DM2 (diabetes mellitus, type 2) (HCC) 09/27/2013   Insomnia 09/27/2013   Intertrigo 09/27/2013   Urolithiasis 09/27/2013   Essential hypertension    GERD, h/o achalasia s/p heller ~2009)    Hyperlipidemia LDL goal <70    CAD- H/O prior PCI    Osteoporosis    Fibromyalgia    Past Medical History:  Diagnosis Date   Anemia    Anxiety and depression    Asthma    Borderline diabetes    CAD (coronary artery disease) ~2009   stent    Cervical cancer (HCC)    stage 1; had cryotherapy   CHF (congestive heart failure) (HCC)    Chronic lower back pain    Complete heart block (HCC) 06/2019   s/p AV nodal ablation and CRT-P by Dr Waddell   DDD (degenerative disc disease), lumbar    Dr. Bonner, recommends lumbar epidural steroid injections L5-S1 to the right   Depression    Fibromyalgia    GERD (gastroesophageal reflux disease)    High cholesterol    History of hiatal hernia    History of kidney stones    Hypertension    Hypothyroidism    Osteoporosis    Permanent atrial fibrillation (HCC) 07/2017   s/p AV nodal ablation   Thyroid  disease     Family History  Problem Relation Age of Onset   Breast cancer Other        aunt    CAD Brother    Lung cancer Mother        F and M   Diabetes Father        F and mother    CAD Father    Lung cancer Father    Stroke Sister    Colon cancer Neg Hx     Past Surgical History:  Procedure Laterality Date   AV NODE ABLATION N/A 06/24/2019   Procedure: AV NODE ABLATION;  Surgeon: Waddell Danelle ORN, MD;  Location: [facility] INVASIVE CV LAB;  Service: Cardiovascular;  Laterality: N/A;    BIV PACEMAKER INSERTION CRT-P N/A 06/24/2019   Procedure: BIV PACEMAKER INSERTION CRT-P;  Surgeon: Waddell Danelle ORN, MD;  Location: [facility] INVASIVE CV LAB;  Service: Cardiovascular;  Laterality: N/A;   CARDIAC CATHETERIZATION  ~ 2008   CARDIOVERSION N/A 08/05/2017   Procedure: CARDIOVERSION;  Surgeon: Waddell Danelle ORN, MD;  Location: [facility] OR;  Service: Cardiovascular;  Laterality: N/A;   CARDIOVERSION N/A 08/24/2017   Procedure: CARDIOVERSION;  Surgeon: Francyne Headland, MD;  Location: [facility] ENDOSCOPY;  Service: Cardiovascular;  Laterality:  N/A;   CARDIOVERSION N/A 06/19/2019   Procedure: CARDIOVERSION;  Surgeon: Rolan Ezra RAMAN, MD;  Location: [facility] OR;  Service: Cardiovascular;  Laterality: N/A;   CARDIOVERSION N/A 06/21/2019   Procedure: CARDIOVERSION;  Surgeon: Rolan Ezra RAMAN, MD;  Location: [facility] ENDOSCOPY;  Service: Cardiovascular;  Laterality: N/A;   CESAREAN SECTION  1975; ~ 1978/1979; 1984   ESOPHAGOMYOTOMY  2009   GYNECOLOGIC CRYOSURGERY  X 2   stage 1 cancer   HERNIA REPAIR  X 6   all in my stomach (07/27/2017)   KNEE ARTHROSCOPY Right    NEPHRECTOMY Right 1991   in [place], due to fibrosis and lithiasis   RIGHT/LEFT HEART CATH AND CORONARY ANGIOGRAPHY N/A 06/17/2019   Procedure: RIGHT/LEFT HEART CATH AND CORONARY ANGIOGRAPHY;  Surgeon: Court Dorn PARAS, MD;  Location: [facility] INVASIVE CV LAB;  Service: Cardiovascular;  Laterality: N/A;   TEE WITHOUT CARDIOVERSION N/A 06/19/2019   Procedure: TRANSESOPHAGEAL ECHOCARDIOGRAM (TEE);  Surgeon: Rolan Ezra RAMAN, MD;  Location: [facility] OR;  Service: Cardiovascular;  Laterality: N/A;   TUBAL LIGATION     Social History   Occupational History   Occupation: stay home   Tobacco Use   Smoking status: Former    Current packs/day: 0.00    Average packs/day: 2.0 packs/day for 28.0 years (56.0 ttl pk-yrs)    Types: Cigarettes    Start date: 4    Quit date: 2000    Years since quitting: 25.6   Smokeless tobacco: Never  Vaping Use   Vaping status:  Never Used  Substance and Sexual Activity   Alcohol use: Yes    Alcohol/week: 5.0 standard drinks of alcohol    Types: 5 Cans of beer per week    Comment: 07/27/2017 3-4 drinks//month   Drug use: No   Sexual activity: Not Currently    Comment: 1ST INTERCOURSE- 16, PARTNERS - 2

## 2024-04-12 ENCOUNTER — Telehealth: Payer: Self-pay

## 2024-04-12 NOTE — Telephone Encounter (Signed)
   Pre-operative Risk Assessment    Patient Name: Melinda Mckinney  DOB: 30-Apr-1956 MRN: [id number]   Date of last office visit: 12/28/23 EULAS FURBISH, MD Date of next office visit: NONE   Request for Surgical Clearance    Procedure:  LEFT WRIST EXTENSON TENOLYSIS  Date of Surgery:  Clearance TBD                                Surgeon:  KAY OZELL CUMMINS, MD Surgeon's Group or Practice Name:  [facility] Phone number:  [phone] Fax number:  [phone]   Type of Clearance Requested:   - Medical  - Pharmacy:  Hold Rivaroxaban  (Xarelto ) 3 DAYS   Type of Anesthesia:  GENERAL   Additional requests/questions:    Signed, Lucie DELENA Ku   04/12/2024, 3:24 PM

## 2024-04-12 NOTE — Telephone Encounter (Signed)
 Pharmacy please advise on holding Xarelto  prior to LEFT WRIST EXTENSON TENOLYSIS  scheduled for TBD. Last labs (CBC) 11/23/23; (BMET) 02/08/24. Thank you.

## 2024-04-15 ENCOUNTER — Other Ambulatory Visit: Payer: Self-pay | Admitting: Internal Medicine

## 2024-04-15 DIAGNOSIS — M810 Age-related osteoporosis without current pathological fracture: Secondary | ICD-10-CM

## 2024-04-16 NOTE — Telephone Encounter (Signed)
 CV Risk Stratification   Plan for left wrist extension tenolysis.   Most recent ECHO 03/2023 LVEF 55-60%.   Office Visit 03/04/2024 -->NYHA II-- Low Risk   Revised Cardiac Risk Index Score 1  1.1% Risk of major cardiac event.   Patient at acceptable CV risk for procedure.   Breccan Galant NP-C  12:44 PM

## 2024-04-16 NOTE — Telephone Encounter (Signed)
 Patient with diagnosis of atrial fibrillation on Xarelto  for anticoagulation.     Procedure:  LEFT WRIST EXTENSON TENOLYSIS   Date of Surgery:  Clearance TBD      CHA2DS2-VASc Score = 6   This indicates a 9.7% annual risk of stroke. The patient's score is based upon: CHF History: 1 HTN History: 1 Diabetes History: 1 Stroke History: 0 Vascular Disease History: 1 Age Score: 1 Gender Score: 1    CrCl 49 Platelet count 254  Patient has not had an Afib/aflutter ablation within the last 3 months or DCCV within the last 30 days  Per office protocol, patient can hold Xarelto  for 3 days prior to procedure.   Patient will not need bridging with Lovenox  (enoxaparin ) around procedure.  **This guidance is not considered finalized until pre-operative APP has relayed final recommendations.**

## 2024-04-16 NOTE — Telephone Encounter (Signed)
     Primary Cardiologist: Annabella Scarce, MD  Chart reviewed as part of pre-operative protocol coverage. Given past medical history and time since last visit, based on ACC/AHA guidelines, Melinda Mckinney would be at acceptable risk for the planned procedure without further cardiovascular testing.   Her RCRI low risk, 1.1% risk of major cardiac event.  Patient has not had an Afib/aflutter ablation within the last 3 months or DCCV within the last 30 days   Per office protocol, patient can hold Xarelto  for 3 days prior to procedure.   Patient will not need bridging with Lovenox  (enoxaparin ) around procedure.  I will route this recommendation to the requesting party via Epic fax function and remove from pre-op pool.  Please call with questions.  Josefa HERO. [name] NP-C     04/16/2024, 12:56 PM [facility] [address] [place], [place] [address] Office [phone]

## 2024-04-16 NOTE — Telephone Encounter (Signed)
 Melinda Mckinney,  Melinda Mckinney 68 year old female was recently seen by you in clinic on 03/04/2024.  She was doing well at that time.  She did note knee pain.  She denies shortness of breath, PND and orthopnea.  She denied fever and chills.  She was taking her medications appropriately.  16-month follow-up was planned.   Her PMH includes coronary artery disease status post PCI 2010, chronic combined systolic and diastolic CHF, EF 54-49% in 2018, atrial fibrillation, BiV PPM, COVID infection, and hyperlipidemia  She is now requesting surgery for left wrist extension tenolysis.  Would she be acceptable risk from a cardiac standpoint for planned surgery?  Please direct your response to CV DIV preop pool.  Melinda Mckinney. Melinda Vanburen NP-C     04/16/2024, 11:33 AM [facility] [address] [place], [place] [address] Office [phone]

## 2024-04-22 ENCOUNTER — Encounter: Payer: Self-pay | Admitting: Orthopaedic Surgery

## 2024-04-23 ENCOUNTER — Other Ambulatory Visit: Payer: Self-pay | Admitting: Physician Assistant

## 2024-04-23 MED ORDER — ONDANSETRON HCL 4 MG PO TABS: 4.0000 mg | ORAL_TABLET | Freq: Three times a day (TID) | ORAL | 0 refills | Status: AC | PRN

## 2024-04-23 MED ORDER — HYDROCODONE-ACETAMINOPHEN 5-325 MG PO TABS: 1.0000 | ORAL_TABLET | Freq: Three times a day (TID) | ORAL | 0 refills | Status: AC | PRN

## 2024-04-24 ENCOUNTER — Encounter (HOSPITAL_COMMUNITY): Payer: Self-pay | Admitting: Orthopaedic Surgery

## 2024-04-24 ENCOUNTER — Encounter: Payer: Self-pay | Admitting: Cardiovascular Disease

## 2024-04-24 NOTE — Progress Notes (Signed)
 PERIOPERATIVE PRESCRIPTION FOR IMPLANTED CARDIAC DEVICE PROGRAMMING  Patient Information: Name:  Melinda Mckinney  DOB:  July 05, 1956  MRN:  [id number]    Planned Procedure:  Left wrist extensor tenolysis  Surgeon:  Dr. Kay Cummins  Date of Procedure:  04/26/2024  Cautery will be used.  Position during surgery:  Supine   Please send documentation back to:  [facility] (Fax # [phone])  Device Information:  Clinic EP Physician:  Dr. Eulas Furbish   Device Type:  Pacemaker Manufacturer and Phone #:  Medtronic: [id number] Pacemaker Dependent?:  Yes.   Date of Last Device Check:  04/01/2024  Normal Device Function?:  Yes.    Electrophysiologist's Recommendations:  Have magnet available. Provide continuous ECG monitoring when magnet is used or reprogramming is to be performed.  Procedure will likely interfere with device function.  Device should be programmed:  Asynchronous pacing during procedure and returned to normal programming after procedure  Per Device Clinic Standing Orders, Almarie ONEIDA Shutter, RN  7:39 PM 04/24/2024

## 2024-04-25 ENCOUNTER — Encounter (HOSPITAL_COMMUNITY): Payer: Self-pay | Admitting: Orthopaedic Surgery

## 2024-04-25 ENCOUNTER — Other Ambulatory Visit: Payer: Self-pay

## 2024-04-25 NOTE — Progress Notes (Addendum)
 SDW attempt.  Multiple attempts to contact patient using [place] interpreter.  Detailed message left by Toribio # [phone]. Arrival location, time of 0530. Hold metformin , hold farxiga  and hold xarelto .  Unable to verify patients last dose of xarelto .  Given list of medications to take the day of surgery with clear fluids until 0415.  Instructions for proper hygiene.  Unable to verify, medications, allergies, medical or surgical history, travel, height or weight.    Pacemaker device orders received and placed on chart.  Medtronic reps made aware.

## 2024-04-25 NOTE — Anesthesia Preprocedure Evaluation (Addendum)
 Anesthesia Evaluation  Patient identified by MRN, date of birth, ID band Patient awake    Reviewed: Allergy & Precautions, NPO status , Patient's Chart, lab work & pertinent test results  Airway Mallampati: II  TM Distance: >3 FB Neck ROM: Full    Dental  (+) Dental Advisory Given, Edentulous Upper, Poor Dentition, Missing   Pulmonary asthma , pneumonia, former smoker   Pulmonary exam normal breath sounds clear to auscultation       Cardiovascular hypertension, Pt. on home beta blockers and Pt. on medications + CAD and +CHF  Normal cardiovascular exam+ dysrhythmias Atrial Fibrillation + pacemaker  Rhythm:Regular Rate:Normal  Pacer interrogation 03/2024 1000% AF, >99% bi-V paced.   Echo 03/2023  1. Left ventricular ejection fraction, by estimation, is 55 to 60%. The  left ventricle has normal function. The left ventricle has no regional  wall motion abnormalities. Left ventricular diastolic parameters are  indeterminate.   2. Right ventricular systolic function is normal. The right ventricular  size is normal.   3. Left atrial size was mildly dilated.   4. The mitral valve is normal in structure. No evidence of mitral valve  regurgitation. No evidence of mitral stenosis.   5. The aortic valve is tricuspid. There is moderate calcification of the  aortic valve. There is mild thickening of the aortic valve. Aortic valve  regurgitation is mild. Aortic valve sclerosis is present, with no evidence  of aortic valve stenosis. Aortic  valve area, by VTI measures 2.07 cm. Aortic valve mean gradient measures 9.0 mmHg. Aortic valve Vmax measures 2.11 m/s.   6. The inferior vena cava is normal in size with greater than 50%  respiratory variability, suggesting right atrial pressure of 3 mmHg.   Comparison(s): No significant change from prior study. Prior images reviewed side by side.     Neuro/Psych  PSYCHIATRIC DISORDERS Anxiety  Depression       GI/Hepatic hiatal hernia,GERD  Medicated and Poorly Controlled,,  Endo/Other  diabetesHypothyroidism    Renal/GU      Musculoskeletal  (+)  Fibromyalgia -  Abdominal  (+) + obese  Peds  Hematology  (+) Blood dyscrasia, anemia   Anesthesia Other Findings   Reproductive/Obstetrics                              Anesthesia Physical Anesthesia Plan  ASA: 3  Anesthesia Plan: General   Post-op Pain Management: Tylenol  PO (pre-op)*   Induction: Intravenous  PONV Risk Score and Plan: 3 and Treatment may vary due to age or medical condition, Dexamethasone  and Ondansetron   Airway Management Planned: Oral ETT  Additional Equipment:   Intra-op Plan:   Post-operative Plan: Extubation in OR  Informed Consent: I have reviewed the patients History and Physical, chart, labs and discussed the procedure including the risks, benefits and alternatives for the proposed anesthesia with the patient or authorized representative who has indicated his/her understanding and acceptance.     Dental advisory given  Plan Discussed with: CRNA  Anesthesia Plan Comments: (PAT note by Lynwood Hope, PA-C: 68 year old female follows with cardiology for history of CAD s/p PCI in 2010 (clean coronaries by cath 06/17/2019), combined heart failure, refractory A-fib s/p AV nodal ablation with placement of Medtronic PPM 06/2019.  Echo 03/2023 showed normalization of EF 55 to 60%.  Last seen in follow-up in heart failure clinic by Greig Mosses, NP on 03/04/2024.  Euvolemic at that time, NYHA II, continued on torsemide   80 mg / 60 mg daily, Entresto  24/26 twice daily, spironolactone  12.5 mg nightly, empagliflozin 10 mg daily, Coreg  6.25 mg twice daily, Xarelto  15 mg daily.  Cardiac clearance per telephone encounter 04/16/2024 by Josefa Beauvais, NP, Chart reviewed as part of pre-operative protocol coverage. Given past medical history and time since last visit, based on ACC/AHA  guidelines, Nevah Dalal would be at acceptable risk for the planned procedure without further cardiovascular testing. Her RCRI low risk, 1.1% risk of major cardiac event. Patient has not had an Afib/aflutter ablation within the last 3 months or DCCV within the last 30 days. Per office protocol, patient can hold Xarelto  for 3 days prior to procedure. Patient will not need bridging with Lovenox  (enoxaparin ) around procedure.  Other pertinent history includes former smoker (56 pack years, quit 2000), CKD 3, non-insulin -dependent DM2, hypothyroidism, GERD on PPI, asthma.  She will need day of surgery labs and evaluation.  EKG 12/28/2023: Ventricular paced rhythm.  Rate 67.  Perioperative prescription for plan to cardiac device programming per progress note 04/24/2024: Device Information:  Clinic EP Physician:  Dr. Eulas Furbish   Device Type:  Pacemaker Manufacturer and Phone #:  Medtronic: [phone] Pacemaker Dependent?:  Yes.   Date of Last Device Check:  04/01/2024      Normal Device Function?:  Yes.    Electrophysiologist's Recommendations:   Have magnet available.  Provide continuous ECG monitoring when magnet is used or reprogramming is to be performed.   Procedure will likely interfere with device function.  Device should be programmed:  Asynchronous pacing during procedure and returned to normal programming after procedure.  TTE 03/20/2023: 1. Left ventricular ejection fraction, by estimation, is 55 to 60%. The  left ventricle has normal function. The left ventricle has no regional  wall motion abnormalities. Left ventricular diastolic parameters are  indeterminate.  2. Right ventricular systolic function is normal. The right ventricular  size is normal.  3. Left atrial size was mildly dilated.  4. The mitral valve is normal in structure. No evidence of mitral valve  regurgitation. No evidence of mitral stenosis.  5. The aortic valve is tricuspid. There is  moderate calcification of the  aortic valve. There is mild thickening of the aortic valve. Aortic valve  regurgitation is mild. Aortic valve sclerosis is present, with no evidence  of aortic valve stenosis. Aortic  valve area, by VTI measures 2.07 cm. Aortic valve mean gradient measures  9.0 mmHg. Aortic valve Vmax measures 2.11 m/s.  6. The inferior vena cava is normal in size with greater than 50%  respiratory variability, suggesting right atrial pressure of 3 mmHg.   Comparison(s): No significant change from prior study. Prior images  reviewed side by side.    )         Anesthesia Quick Evaluation

## 2024-04-25 NOTE — Progress Notes (Signed)
 SDW call  Debbi Nardi from Dr. Benjiman office calls to state patients husband is on the phone and not received any phone calls, even those multiple attempts had been made and a very detailed VM left. Correct phone number verified. Patient's husband Melinda Mckinney was given pre-op instructions over the phone. He verbalized understanding of instructions provided.     PCP - [facility] Cardiologist - Dr. Annabella Scarce, clearance 04/12/2024 EP Cardiologist: Dr. Eulas Furbish Pulmonary:    PPM/ICD - Yes, Medtronic Device Orders - received and on chart Rep Notified - Yes, Don[place] and [place]   Chest x-ray - na EKG -  03/21/2024 Stress Test - ECHO - 03/20/2023 Cardiac Cath - 06/17/2019  Sleep Study/sleep apnea/CPAP: denies  Non-diabetic   Blood Thinner Instructions: Xarelto , was suppose to hold 3 days, states last day 04/24/2024 Aspirin  Instructions:denies  Metformin , hold DOS Farxiga , states last dose today 04/25/2024   ERAS Protcol - Clears until 0415   Anesthesia review: Yes. Pacemaker, CAD, HTN, CHF, HLD, A-Fib   Your procedure is scheduled on Friday April 26, 2024  Report to [facility] Main Entrance A at  0530  A.M., then check in with the Admitting office.  Call this number if you have problems the morning of surgery:  [phone]   If you have any questions prior to your surgery date call [phone]: Open Monday-Friday 8am-4pm If you experience any cold or flu symptoms such as cough, fever, chills, shortness of breath, etc. between now and your scheduled surgery, please notify us  at the above number     Remember:  Do not eat after midnight the night before your surgery  You may drink clear liquids until  0415   the morning of your surgery.   Clear liquids allowed are: Water, Non-Citrus Juices (without pulp), Carbonated Beverages, Clear Tea, Black Coffee ONLY (NO MILK, CREAM OR POWDERED CREAMER of any kind), and Gatorade   Take these medicines the morning of surgery with A  SIP OF WATER:  Allopurinol , carvedilol , cymbalta , levothryroxine, medrol  dose pack, prilosec  As needed: Flexeril , norco  As of today, STOP taking any Aspirin  (unless otherwise instructed by your surgeon) Aleve, Naproxen, Ibuprofen, Motrin, Advil, Goody's, BC's, all herbal medications, fish oil, and all vitamins.

## 2024-04-25 NOTE — Progress Notes (Signed)
 Anesthesia Chart Review: Same day workup  68 year old female follows with cardiology for history of CAD s/p PCI in 2010 (clean coronaries by cath 06/17/2019), combined heart failure, refractory A-fib s/p AV nodal ablation with placement of Medtronic PPM 06/2019.  Echo 03/2023 showed normalization of EF 55 to 60%.  Last seen in follow-up in heart failure clinic by Greig Mosses, NP on 03/04/2024.  Euvolemic at that time, NYHA II, continued on torsemide  80 mg / 60 mg daily, Entresto  24/26 twice daily, spironolactone  12.5 mg nightly, empagliflozin 10 mg daily, Coreg  6.25 mg twice daily, Xarelto  15 mg daily.  Cardiac clearance per telephone encounter 04/16/2024 by Josefa Beauvais, NP, Chart reviewed as part of pre-operative protocol coverage. Given past medical history and time since last visit, based on ACC/AHA guidelines, Melinda Mckinney would be at acceptable risk for the planned procedure without further cardiovascular testing. Her RCRI low risk, 1.1% risk of major cardiac event. Patient has not had an Afib/aflutter ablation within the last 3 months or DCCV within the last 30 days. Per office protocol, patient can hold Xarelto  for 3 days prior to procedure. Patient will not need bridging with Lovenox  (enoxaparin ) around procedure.  Other pertinent history includes former smoker (56 pack years, quit 2000), CKD 3, non-insulin -dependent DM2, hypothyroidism, GERD on PPI, asthma.  She will need day of surgery labs and evaluation.  EKG 12/28/2023: Ventricular paced rhythm.  Rate 67.  Perioperative prescription for plan to cardiac device programming per progress note 04/24/2024: Device Information:   Clinic EP Physician:  Dr. Eulas Furbish    Device Type:  Pacemaker Manufacturer and Phone #:  Medtronic: [phone] Pacemaker Dependent?:  Yes.   Date of Last Device Check:  04/01/2024      Normal Device Function?:  Yes.     Electrophysiologist's Recommendations:   Have magnet available. Provide continuous ECG  monitoring when magnet is used or reprogramming is to be performed.  Procedure will likely interfere with device function.  Device should be programmed:  Asynchronous pacing during procedure and returned to normal programming after procedure.  TTE 03/20/2023: 1. Left ventricular ejection fraction, by estimation, is 55 to 60%. The  left ventricle has normal function. The left ventricle has no regional  wall motion abnormalities. Left ventricular diastolic parameters are  indeterminate.   2. Right ventricular systolic function is normal. The right ventricular  size is normal.   3. Left atrial size was mildly dilated.   4. The mitral valve is normal in structure. No evidence of mitral valve  regurgitation. No evidence of mitral stenosis.   5. The aortic valve is tricuspid. There is moderate calcification of the  aortic valve. There is mild thickening of the aortic valve. Aortic valve  regurgitation is mild. Aortic valve sclerosis is present, with no evidence  of aortic valve stenosis. Aortic   valve area, by VTI measures 2.07 cm. Aortic valve mean gradient measures  9.0 mmHg. Aortic valve Vmax measures 2.11 m/s.   6. The inferior vena cava is normal in size with greater than 50%  respiratory variability, suggesting right atrial pressure of 3 mmHg.   Comparison(s): No significant change from prior study. Prior images  reviewed side by side.      Lynwood Geofm RIGGERS [facility] Stay [facility]/Anesthesiology Phone [phone] 04/25/2024 11:44 AM

## 2024-04-26 ENCOUNTER — Other Ambulatory Visit: Payer: Self-pay

## 2024-04-26 ENCOUNTER — Encounter (HOSPITAL_COMMUNITY): Payer: Self-pay | Admitting: Orthopaedic Surgery

## 2024-04-26 ENCOUNTER — Ambulatory Visit (HOSPITAL_COMMUNITY): Admitting: Physician Assistant

## 2024-04-26 ENCOUNTER — Encounter (HOSPITAL_COMMUNITY): Admitting: Physician Assistant

## 2024-04-26 ENCOUNTER — Ambulatory Visit (HOSPITAL_COMMUNITY)
Admission: RE | Admit: 2024-04-26 | Discharge: 2024-04-26 | Disposition: A | Discharge status: Home / Self Care | Attending: Orthopaedic Surgery | Admitting: Orthopaedic Surgery

## 2024-04-26 ENCOUNTER — Encounter (HOSPITAL_COMMUNITY)
Admission: RE | Disposition: A | Discharge status: Home / Self Care | Payer: Self-pay | Source: Home / Self Care | Attending: Orthopaedic Surgery

## 2024-04-26 DIAGNOSIS — I5021 Acute systolic (congestive) heart failure: Secondary | ICD-10-CM | POA: Diagnosis not present

## 2024-04-26 DIAGNOSIS — Z7984 Long term (current) use of oral hypoglycemic drugs: Secondary | ICD-10-CM | POA: Diagnosis not present

## 2024-04-26 DIAGNOSIS — I4891 Unspecified atrial fibrillation: Secondary | ICD-10-CM | POA: Insufficient documentation

## 2024-04-26 DIAGNOSIS — I2510 Atherosclerotic heart disease of native coronary artery without angina pectoris: Secondary | ICD-10-CM | POA: Insufficient documentation

## 2024-04-26 DIAGNOSIS — E039 Hypothyroidism, unspecified: Secondary | ICD-10-CM | POA: Insufficient documentation

## 2024-04-26 DIAGNOSIS — Z79890 Hormone replacement therapy (postmenopausal): Secondary | ICD-10-CM | POA: Insufficient documentation

## 2024-04-26 DIAGNOSIS — I110 Hypertensive heart disease with heart failure: Secondary | ICD-10-CM

## 2024-04-26 DIAGNOSIS — M654 Radial styloid tenosynovitis [de Quervain]: Secondary | ICD-10-CM | POA: Diagnosis not present

## 2024-04-26 DIAGNOSIS — Z87891 Personal history of nicotine dependence: Secondary | ICD-10-CM | POA: Diagnosis not present

## 2024-04-26 DIAGNOSIS — I130 Hypertensive heart and chronic kidney disease with heart failure and stage 1 through stage 4 chronic kidney disease, or unspecified chronic kidney disease: Secondary | ICD-10-CM | POA: Insufficient documentation

## 2024-04-26 DIAGNOSIS — Z7901 Long term (current) use of anticoagulants: Secondary | ICD-10-CM | POA: Insufficient documentation

## 2024-04-26 DIAGNOSIS — F419 Anxiety disorder, unspecified: Secondary | ICD-10-CM | POA: Insufficient documentation

## 2024-04-26 DIAGNOSIS — N183 Chronic kidney disease, stage 3 unspecified: Secondary | ICD-10-CM | POA: Insufficient documentation

## 2024-04-26 DIAGNOSIS — K219 Gastro-esophageal reflux disease without esophagitis: Secondary | ICD-10-CM | POA: Diagnosis not present

## 2024-04-26 DIAGNOSIS — K449 Diaphragmatic hernia without obstruction or gangrene: Secondary | ICD-10-CM | POA: Insufficient documentation

## 2024-04-26 DIAGNOSIS — J45909 Unspecified asthma, uncomplicated: Secondary | ICD-10-CM | POA: Insufficient documentation

## 2024-04-26 DIAGNOSIS — M65932 Unspecified synovitis and tenosynovitis, left forearm: Secondary | ICD-10-CM | POA: Insufficient documentation

## 2024-04-26 DIAGNOSIS — Z79899 Other long term (current) drug therapy: Secondary | ICD-10-CM | POA: Diagnosis not present

## 2024-04-26 DIAGNOSIS — F32A Depression, unspecified: Secondary | ICD-10-CM | POA: Insufficient documentation

## 2024-04-26 DIAGNOSIS — I4821 Permanent atrial fibrillation: Secondary | ICD-10-CM

## 2024-04-26 DIAGNOSIS — I509 Heart failure, unspecified: Secondary | ICD-10-CM | POA: Insufficient documentation

## 2024-04-26 HISTORY — PX: REPAIR EXTENSOR TENDON: SHX5382

## 2024-04-26 HISTORY — DX: Presence of cardiac pacemaker: Z95.0

## 2024-04-26 LAB — CBC
HCT: 37.2 % (ref 36.0–46.0)
Hemoglobin: 11.9 g/dL — ABNORMAL LOW (ref 12.0–15.0)
MCH: 31.4 pg (ref 26.0–34.0)
MCHC: 32 g/dL (ref 30.0–36.0)
MCV: 98.2 fL (ref 80.0–100.0)
Platelets: 252 K/uL (ref 150–400)
RBC: 3.79 MIL/uL — ABNORMAL LOW (ref 3.87–5.11)
RDW: 14.3 % (ref 11.5–15.5)
WBC: 7.7 K/uL (ref 4.0–10.5)
nRBC: 0 % (ref 0.0–0.2)

## 2024-04-26 LAB — BASIC METABOLIC PANEL WITH GFR
Anion gap: 12 (ref 5–15)
BUN: 24 mg/dL — ABNORMAL HIGH (ref 8–23)
CO2: 24 mmol/L (ref 22–32)
Calcium: 9.4 mg/dL (ref 8.9–10.3)
Chloride: 103 mmol/L (ref 98–111)
Creatinine, Ser: 1.83 mg/dL — ABNORMAL HIGH (ref 0.44–1.00)
GFR, Estimated: 29 mL/min — ABNORMAL LOW (ref >60)
Glucose, Bld: 97 mg/dL (ref 70–99)
Potassium: 4 mmol/L (ref 3.5–5.1)
Sodium: 139 mmol/L (ref 135–145)

## 2024-04-26 SURGERY — REPAIR, TENDON, EXTENSOR
Anesthesia: General | Site: Wrist | Laterality: Left

## 2024-04-26 MED ORDER — HYDROCODONE-ACETAMINOPHEN 5-325 MG PO TABS
1.0000 | ORAL_TABLET | Freq: Four times a day (QID) | ORAL | Status: DC | PRN
  Administered 2024-04-26: 1 via ORAL

## 2024-04-26 MED ORDER — CARVEDILOL 3.125 MG PO TABS
ORAL_TABLET | ORAL | Status: AC
  Filled 2024-04-26: qty 2

## 2024-04-26 MED ORDER — PROPOFOL 10 MG/ML IV BOLUS
INTRAVENOUS | Status: DC | PRN
  Administered 2024-04-26: 100 mg via INTRAVENOUS
  Administered 2024-04-26: 40 mg via INTRAVENOUS

## 2024-04-26 MED ORDER — ORAL CARE MOUTH RINSE: 15.0000 mL | Freq: Once | OROMUCOSAL | Status: AC

## 2024-04-26 MED ORDER — FENTANYL CITRATE (PF) 250 MCG/5ML IJ SOLN
INTRAMUSCULAR | Status: DC | PRN
  Administered 2024-04-26: 50 ug via INTRAVENOUS
  Administered 2024-04-26: 25 ug via INTRAVENOUS
  Administered 2024-04-26 (×2): 50 ug via INTRAVENOUS

## 2024-04-26 MED ORDER — FENTANYL CITRATE (PF) 250 MCG/5ML IJ SOLN
INTRAMUSCULAR | Status: AC
  Filled 2024-04-26: qty 5

## 2024-04-26 MED ORDER — LIDOCAINE 2% (20 MG/ML) 5 ML SYRINGE
INTRAMUSCULAR | Status: DC | PRN
  Administered 2024-04-26: 60 mg via INTRAVENOUS

## 2024-04-26 MED ORDER — ACETAMINOPHEN 500 MG PO TABS
1000.0000 mg | ORAL_TABLET | Freq: Once | ORAL | Status: AC
  Administered 2024-04-26: 1000 mg via ORAL
  Filled 2024-04-26: qty 2

## 2024-04-26 MED ORDER — CARVEDILOL 3.125 MG PO TABS
6.2500 mg | ORAL_TABLET | Freq: Once | ORAL | Status: AC
Start: 2024-04-26 — End: 2024-04-26
  Administered 2024-04-26: 6.25 mg via ORAL

## 2024-04-26 MED ORDER — SUGAMMADEX SODIUM 200 MG/2ML IV SOLN
INTRAVENOUS | Status: DC | PRN
  Administered 2024-04-26: 200 mg via INTRAVENOUS

## 2024-04-26 MED ORDER — PROPOFOL 10 MG/ML IV BOLUS
INTRAVENOUS | Status: AC
  Filled 2024-04-26: qty 20

## 2024-04-26 MED ORDER — LACTATED RINGERS IV SOLN
INTRAVENOUS | Status: DC
Start: 2024-04-26 — End: 2024-04-26

## 2024-04-26 MED ORDER — SODIUM CHLORIDE 0.9 % IR SOLN
Status: DC | PRN
  Administered 2024-04-26: 1

## 2024-04-26 MED ORDER — DROPERIDOL 2.5 MG/ML IJ SOLN: 0.6250 mg | Freq: Once | INTRAMUSCULAR | Status: DC | PRN

## 2024-04-26 MED ORDER — BUPIVACAINE-EPINEPHRINE (PF) 0.25% -1:200000 IJ SOLN
INTRAMUSCULAR | Status: DC | PRN
  Administered 2024-04-26: 10 mL

## 2024-04-26 MED ORDER — CEFAZOLIN SODIUM-DEXTROSE 2-4 GM/100ML-% IV SOLN
2.0000 g | INTRAVENOUS | Status: AC
  Administered 2024-04-26: 2 g via INTRAVENOUS
  Filled 2024-04-26: qty 100

## 2024-04-26 MED ORDER — HYDROCODONE-ACETAMINOPHEN 5-325 MG PO TABS
ORAL_TABLET | ORAL | Status: AC
  Filled 2024-04-26: qty 1

## 2024-04-26 MED ORDER — CHLORHEXIDINE GLUCONATE 0.12 % MT SOLN
15.0000 mL | Freq: Once | OROMUCOSAL | Status: AC
  Administered 2024-04-26: 15 mL via OROMUCOSAL
  Filled 2024-04-26: qty 15

## 2024-04-26 MED ORDER — SUCCINYLCHOLINE CHLORIDE 200 MG/10ML IV SOSY
PREFILLED_SYRINGE | INTRAVENOUS | Status: AC
  Filled 2024-04-26: qty 10

## 2024-04-26 MED ORDER — ROCURONIUM BROMIDE 10 MG/ML (PF) SYRINGE
PREFILLED_SYRINGE | INTRAVENOUS | Status: DC | PRN
  Administered 2024-04-26: 30 mg via INTRAVENOUS

## 2024-04-26 MED ORDER — LACTATED RINGERS IV SOLN: INTRAVENOUS | Status: DC

## 2024-04-26 MED ORDER — ONDANSETRON HCL 4 MG/2ML IJ SOLN
INTRAMUSCULAR | Status: DC | PRN
  Administered 2024-04-26: 4 mg via INTRAVENOUS

## 2024-04-26 MED ORDER — PHENYLEPHRINE 80 MCG/ML (10ML) SYRINGE FOR IV PUSH (FOR BLOOD PRESSURE SUPPORT)
PREFILLED_SYRINGE | INTRAVENOUS | Status: AC
Start: 2024-04-26 — End: 2024-04-26
  Filled 2024-04-26: qty 10

## 2024-04-26 MED ORDER — FENTANYL CITRATE (PF) 100 MCG/2ML IJ SOLN: 25.0000 ug | INTRAMUSCULAR | Status: DC | PRN

## 2024-04-26 MED ORDER — SUCCINYLCHOLINE CHLORIDE 200 MG/10ML IV SOSY
PREFILLED_SYRINGE | INTRAVENOUS | Status: DC | PRN
  Administered 2024-04-26: 100 mg via INTRAVENOUS

## 2024-04-26 MED ORDER — ROCURONIUM BROMIDE 10 MG/ML (PF) SYRINGE
PREFILLED_SYRINGE | INTRAVENOUS | Status: AC
  Filled 2024-04-26: qty 10

## 2024-04-26 MED ORDER — BUPIVACAINE HCL (PF) 0.25 % IJ SOLN
INTRAMUSCULAR | Status: AC
Start: 2024-04-26 — End: 2024-04-26
  Filled 2024-04-26: qty 30

## 2024-04-26 SURGICAL SUPPLY — 37 items
BAG COUNTER SPONGE SURGICOUNT (BAG) ×2 IMPLANT
BNDG COHESIVE 3X5 TAN ST LF (GAUZE/BANDAGES/DRESSINGS) IMPLANT
BNDG COMPR ESMARK 4X3 LF (GAUZE/BANDAGES/DRESSINGS) ×2 IMPLANT
BNDG ELASTIC 2INX 5YD STR LF (GAUZE/BANDAGES/DRESSINGS) IMPLANT
BNDG ELASTIC 3INX 5YD STR LF (GAUZE/BANDAGES/DRESSINGS) IMPLANT
BNDG STRETCH GAUZE 3IN X12FT (GAUZE/BANDAGES/DRESSINGS) IMPLANT
CORD BIPOLAR FORCEPS 12FT (ELECTRODE) IMPLANT
COVER SURGICAL LIGHT HANDLE (MISCELLANEOUS) ×2 IMPLANT
CUFF TOURN SGL QUICK 18X4 (TOURNIQUET CUFF) IMPLANT
DRAPE SURG 17X23 STRL (DRAPES) IMPLANT
DURAPREP 26ML APPLICATOR (WOUND CARE) ×2 IMPLANT
ELECTRODE REM PT RTRN 9FT ADLT (ELECTROSURGICAL) IMPLANT
GAUZE SPONGE 4X4 12PLY STRL (GAUZE/BANDAGES/DRESSINGS) ×2 IMPLANT
GAUZE XEROFORM 1X8 LF (GAUZE/BANDAGES/DRESSINGS) ×2 IMPLANT
GLOVE BIOGEL PI IND STRL 7.0 (GLOVE) ×6 IMPLANT
GLOVE BIOGEL PI IND STRL 7.5 (GLOVE) ×2 IMPLANT
GLOVE ECLIPSE 7.0 STRL STRAW (GLOVE) ×4 IMPLANT
GLOVE SURG SYN 7.5 PF PI (GLOVE) ×4 IMPLANT
GOWN STRL REUS W/ TWL LRG LVL3 (GOWN DISPOSABLE) ×2 IMPLANT
GOWN STRL SURGICAL XL XLNG (GOWN DISPOSABLE) ×4 IMPLANT
KIT BASIN OR (CUSTOM PROCEDURE TRAY) ×2 IMPLANT
KIT TURNOVER KIT B (KITS) ×2 IMPLANT
NDL HYPO 25GX1X1/2 BEV (NEEDLE) IMPLANT
NEEDLE HYPO 25GX1X1/2 BEV (NEEDLE) ×1 IMPLANT
NS IRRIG 1000ML POUR BTL (IV SOLUTION) ×4 IMPLANT
PACK ORTHO EXTREMITY (CUSTOM PROCEDURE TRAY) ×2 IMPLANT
PAD ARMBOARD POSITIONER FOAM (MISCELLANEOUS) ×2 IMPLANT
PADDING CAST ABS COTTON 3X4 (CAST SUPPLIES) IMPLANT
PADDING CAST ABS COTTON 4X4 ST (CAST SUPPLIES) ×4 IMPLANT
SPIKE FLUID TRANSFER (MISCELLANEOUS) IMPLANT
SUT ETHILON 4 0 PS 2 18 (SUTURE) ×2 IMPLANT
SYR CONTROL 10ML LL (SYRINGE) IMPLANT
TOWEL GREEN STERILE (TOWEL DISPOSABLE) ×2 IMPLANT
TOWEL GREEN STERILE FF (TOWEL DISPOSABLE) ×2 IMPLANT
TUBE CONNECTING 12X1/4 (SUCTIONS) IMPLANT
UNDERPAD 30X36 HEAVY ABSORB (UNDERPADS AND DIAPERS) ×4 IMPLANT
WATER STERILE IRR 1000ML POUR (IV SOLUTION) ×2 IMPLANT

## 2024-04-26 NOTE — Transfer of Care (Signed)
 Immediate Anesthesia Transfer of Care Note  Patient: Melinda Mckinney  Procedure(s) Performed: LEFT WRIST, FIRST DORSAL COMPARTMENT RELEASE (Left: Wrist)  Patient Location: PACU  Anesthesia Type:General  Level of Consciousness: awake, alert , and oriented  Airway & Oxygen Therapy: Patient connected to face mask oxygen  Post-op Assessment: Report given to RN, Post -op Vital signs reviewed and stable, and Patient moving all extremities X 4  Post vital signs: Reviewed and stable  Last Vitals:  Vitals Value Taken Time  BP 122/60 04/26/24 08:22  Temp    Pulse 69 04/26/24 08:25  Resp 15 04/26/24 08:25  SpO2 100 % 04/26/24 08:25  Vitals shown include unfiled device data.  Last Pain:  Vitals:   04/26/24 0637  TempSrc:   PainSc: 0-No pain         Complications: There were no known notable events for this encounter.

## 2024-04-26 NOTE — Progress Notes (Signed)
 [employer] called and rep will be paged.

## 2024-04-26 NOTE — H&P (Signed)
 PREOPERATIVE H&P  Chief Complaint: Tenosynovitis, de Quervain left wrist  HPI: Melinda Mckinney is a 68 y.o. female who presents for surgical treatment of Tenosynovitis, de Quervain left wrist.  She denies any changes in medical history.  Past Surgical History:  Procedure Laterality Date  . AV NODE ABLATION N/A 06/24/2019   Procedure: AV NODE ABLATION;  Surgeon: Waddell Danelle ORN, MD;  Location: [facility] INVASIVE CV LAB;  Service: Cardiovascular;  Laterality: N/A;  . BIV PACEMAKER INSERTION CRT-P N/A 06/24/2019   Procedure: BIV PACEMAKER INSERTION CRT-P;  Surgeon: Waddell Danelle ORN, MD;  Location: [facility] INVASIVE CV LAB;  Service: Cardiovascular;  Laterality: N/A;  . CARDIAC CATHETERIZATION  ~ 2008  . CARDIOVERSION N/A 08/05/2017   Procedure: CARDIOVERSION;  Surgeon: Waddell Danelle ORN, MD;  Location: [facility] OR;  Service: Cardiovascular;  Laterality: N/A;  . CARDIOVERSION N/A 08/24/2017   Procedure: CARDIOVERSION;  Surgeon: Francyne Headland, MD;  Location: [facility] ENDOSCOPY;  Service: Cardiovascular;  Laterality: N/A;  . CARDIOVERSION N/A 06/19/2019   Procedure: CARDIOVERSION;  Surgeon: Rolan Ezra RAMAN, MD;  Location: [facility] OR;  Service: Cardiovascular;  Laterality: N/A;  . CARDIOVERSION N/A 06/21/2019   Procedure: CARDIOVERSION;  Surgeon: Rolan Ezra RAMAN, MD;  Location: [facility] ENDOSCOPY;  Service: Cardiovascular;  Laterality: N/A;  . CESAREAN SECTION  1975; ~ 1978/1979; 1984  . ESOPHAGOMYOTOMY  2009  . GYNECOLOGIC CRYOSURGERY  X 2   stage 1 cancer  . HERNIA REPAIR  X 6   all in my stomach (07/27/2017)  . KNEE ARTHROSCOPY Right   . NEPHRECTOMY Right 1991   in [place], due to fibrosis and lithiasis  . RIGHT/LEFT HEART CATH AND CORONARY ANGIOGRAPHY N/A 06/17/2019   Procedure: RIGHT/LEFT HEART CATH AND CORONARY ANGIOGRAPHY;  Surgeon: Court Dorn PARAS, MD;  Location: [facility] INVASIVE CV LAB;  Service: Cardiovascular;  Laterality: N/A;  . TEE WITHOUT CARDIOVERSION N/A 06/19/2019   Procedure: TRANSESOPHAGEAL  ECHOCARDIOGRAM (TEE);  Surgeon: Rolan Ezra RAMAN, MD;  Location: [facility] OR;  Service: Cardiovascular;  Laterality: N/A;  . TUBAL LIGATION     Social History   Socioeconomic History  . Marital status: Married    Spouse name: Not on file  . Number of children: 3  . Years of education: Not on file  . Highest education level: Not on file  Occupational History  . 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 Smoking status: Former    Current packs/day: 0.00    Average packs/day: 2.0 packs/day for 28.0 years (56.0 ttl pk-yrs)    Types: Cigarettes    Start date: 82    Quit date: 2000    Years since quitting: 25.7  . Smokeless tobacco: Never  Vaping Use  . Vaping status: Never Used  Substance and Sexual Activity  . Alcohol use: Yes    Alcohol/week: 5.0 standard drinks of alcohol    Types: 5 Cans of beer per week    Comment: 07/27/2017 3-4 drinks//month  . Drug use: No  . Sexual activity: Not Currently    Comment: 1ST INTERCOURSE- 16, PARTNERS - 2  Other Topics Concern  . Not on file  Social History Narrative   Original from [place]   3 children, 2 alive   Household --pt and husband    Social Drivers of Corporate investment banker Strain: Low Risk  (11/29/2021)   Overall Financial Resource Strain (CARDIA)   . Difficulty of Paying Living Expenses: Not hard at all  Food Insecurity: Low Risk  (03/30/2023)  Received from [facility]   Hunger Vital Sign   . Within the past 12 months, you worried that your food would run out before you got money to buy more: Never true   . Within the past 12 months, the food you bought just didn't last and you didn't have money to get more. : Never true  Transportation Needs: Not on file (03/30/2023)  Physical Activity: Inactive (11/29/2021)   Exercise Vital Sign   . Days of Exercise per Week: 0 days   . Minutes of Exercise per Session: 0 min  Stress: No Stress Concern Present (11/29/2021)   [employer] of Occupational Health - Occupational  Stress Questionnaire   . Feeling of Stress : Not at all  Social Connections: Moderately Integrated (11/29/2021)   Social Connection and Isolation Panel   . Frequency of Communication with Friends and Family: More than three times a week   . Frequency of Social Gatherings with Friends and Family: More than three times a week   . Attends Religious Services: Never   . Active Member of Clubs or Organizations: Yes   . Attends Banker Meetings: More than 4 times per year   . Marital Status: Married   Family History  Problem Relation Age of Onset  . Breast cancer Other        aunt   . CAD Brother   . Lung cancer Mother        F and M  . Diabetes Father        F and mother   . CAD Father   . Lung cancer Father   . Stroke Sister   . Colon cancer Neg Hx    Allergies  Allergen Reactions  . Penicillins Swelling and Rash    Has patient had a PCN reaction causing immediate rash, facial/tongue/throat swelling, SOB or lightheadedness with hypotension: No Has patient had a PCN reaction causing severe rash involving mucus membranes or skin necrosis: No Has patient had a PCN reaction that required hospitalization: No Has patient had a PCN reaction occurring within the last 10 years: No If all of the above answers are NO, then may proceed with Cephalosporin use.   Prior to Admission medications   Medication Sig Start Date End Date Taking? Authorizing Provider  alendronate  (FOSAMAX ) 70 MG tablet TAKE 1 TABLET BY MOUTH ONCE WEEKLY ON AN EMPTY STOMACH BEFORE BREAKFAST. REMAIN UPRIGHT FOR 30 MINUTES AND TAKE WITH 8 OUNCES OF WATER 04/17/24  Yes Trixie File, MD  allopurinol  (ZYLOPRIM ) 100 MG tablet Take 1 tablet (100 mg total) by mouth daily. 11/07/22  Yes Paz, Aloysius BRAVO, MD  atorvastatin  (LIPITOR) 40 MG tablet Take 1.5 tablets (60 mg total) by mouth at bedtime. 11/16/22  Yes Paz, Jose E, MD  carvedilol  (COREG ) 6.25 MG tablet Take 1 tablet (6.25 mg total) by mouth 2 (two) times daily with a  meal. 08/28/23  Yes Rolan Ezra RAMAN, MD  Cholecalciferol  (VITAMIN D3) 5000 UNITS CAPS Take 5,000 Units by mouth daily.    Yes [provider]  cyclobenzaprine  (FLEXERIL ) 10 MG tablet Take 1 tablet (10 mg total) by mouth 2 (two) times daily as needed for muscle spasms. 10/01/18  Yes Amon Aloysius BRAVO, MD  DULoxetine  (CYMBALTA ) 60 MG capsule TAKE 1 CAPSULE BY MOUTH EVERY DAY 10/06/22  Yes Paz, Jose E, MD  FARXIGA  10 MG TABS tablet Take 1 tablet (10 mg total) by mouth daily before breakfast. 02/27/24  Yes McLean, Dalton S, MD  ferrous  fumarate (HEMOCYTE - 106 MG FE) 325 (106 Fe) MG TABS tablet Take 1 tablet (106 mg of iron total) by mouth 2 (two) times daily. Do not take at the same time as omeprazole  (Prilosec) 06/24/20  Yes Paz, Jose E, MD  levothyroxine  (SYNTHROID ) 112 MCG tablet Take 1 tablet (112 mcg total) by mouth daily before breakfast. 10/17/23  Yes Trixie File, MD  metFORMIN  (GLUCOPHAGE ) 500 MG tablet Take 1 tablet (500 mg total) by mouth 2 (two) times daily with a meal. 02/17/23  Yes Paz, Aloysius BRAVO, MD  omeprazole  (PRILOSEC) 20 MG capsule TAKE 1 CAPSULE(20 MG) BY MOUTH TWICE DAILY BEFORE A MEAL 03/22/23  Yes Paz, Jose E, MD  sacubitril -valsartan  (ENTRESTO ) 24-26 MG Take 1 tablet by mouth 2 (two) times daily. 11/27/23  Yes Rolan Ezra RAMAN, MD  spironolactone  (ALDACTONE ) 25 MG tablet Take 0.5 tablets (12.5 mg total) by mouth at bedtime. 03/08/23 06/18/24 Yes Rolan Ezra RAMAN, MD  torsemide  (DEMADEX ) 20 MG tablet Take 4 tablets (80 mg total) by mouth every morning AND 3 tablets (60 mg total) every evening. 01/23/24 04/26/24 Yes Rolan Ezra RAMAN, MD  triamcinolone  (KENALOG) 0.025 % ointment Apply topically as needed. 02/17/22  Yes [provider]  clobetasol  cream (TEMOVATE ) 0.05 % Apply topically 2 (two) times daily as needed. 04/28/22   Paz, Jose E, MD  HYDROcodone -acetaminophen  (NORCO/VICODIN) 5-325 MG tablet Take 1 tablet by mouth 3 (three) times daily as needed. 04/23/24   Jule Ronal CROME, PA-C   ketoconazole  (NIZORAL ) 2 % cream Apply 1 application topically daily. 03/18/20   Amon Aloysius BRAVO, MD  methylPREDNISolone  (MEDROL  DOSEPAK) 4 MG TBPK tablet Take as directed 04/11/24   Jerri Kay HERO, MD  ondansetron  (ZOFRAN ) 4 MG tablet Take 1 tablet (4 mg total) by mouth every 8 (eight) hours as needed for nausea or vomiting. 04/23/24   Stanbery, Mary L, PA-C  potassium chloride  SA (KLOR-CON  M) 20 MEQ tablet Take 1 tablet (20 mEq total) by mouth daily. 03/21/23   Rolan Ezra RAMAN, MD  Rivaroxaban  (XARELTO ) 15 MG TABS tablet Take 1 tablet (15 mg total) by mouth daily with supper. 03/01/24   Rolan Ezra RAMAN, MD     Positive ROS: All other systems have been reviewed and were otherwise negative with the exception of those mentioned in the HPI and as above.  Physical Exam: General: Alert, no acute distress Cardiovascular: No pedal edema Respiratory: No cyanosis, no use of accessory musculature GI: abdomen soft Skin: No lesions in the area of chief complaint Neurologic: Sensation intact distally Psychiatric: Patient is competent for consent with normal mood and affect Lymphatic: no lymphedema  MUSCULOSKELETAL: exam stable  Assessment: Tenosynovitis, de Quervain left wrist  Plan: Plan for Procedure(s): REPAIR, TENDON, EXTENSOR  The risks benefits and alternatives were discussed with the patient including but not limited to the risks of nonoperative treatment, versus surgical intervention including infection, bleeding, nerve injury,  blood clots, cardiopulmonary complications, morbidity, mortality, among others, and they were willing to proceed.   Ozell Jerri, MD 04/26/2024 6:42 AM

## 2024-04-26 NOTE — Anesthesia Procedure Notes (Addendum)
 Procedure Name: Intubation Date/Time: 04/26/2024 7:31 AM  Performed by: Christopher Comings, CRNAPre-anesthesia Checklist: Patient identified, Emergency Drugs available, Suction available and Patient being monitored Patient Re-evaluated:Patient Re-evaluated prior to induction Oxygen Delivery Method: Circle system utilized Preoxygenation: Pre-oxygenation with 100% oxygen Induction Type: IV induction Ventilation: Mask ventilation without difficulty Laryngoscope Size: Mac and 4 Grade View: Grade I Tube type: Oral Tube size: 7.0 mm Number of attempts: 1 Airway Equipment and Method: Stylet and Oral airway Placement Confirmation: ETT inserted through vocal cords under direct vision, positive ETCO2 and breath sounds checked- equal and bilateral Secured at: 22 cm Tube secured with: Tape Dental Injury: Teeth and Oropharynx as per pre-operative assessment

## 2024-04-26 NOTE — Op Note (Signed)
   DATE OF SURGERY: 04/26/2024  PREOPERATIVE DIAGNOSES:  Left recalcitrant de Quervain's tenosynovitis   POSTOPERATIVE DIAGNOSES:  1.  same  PROCEDURES:  Tenolysis of left wrist APL and EPB tendons. CPT 757 [id number]  OPERATIVE FINDINGS: 4 separate slips of APL tendon Severe tenosynovitis of APL and EPB tendons  SURGEON: Kay Cummins, M.D.  ASSIST: Ronal Jacobsen [place], [place]; necessary for the timely completion of procedure and due to complexity of procedure.  ANESTHESIA: general  TOURNIQUET TIME: 20 minutes  BLOOD LOSS: Minimal.   COMPLICATIONS: None.   PATHOLOGY: None.   TIME OUT: A time out was performed before the procedure started.   INDICATIONS: The patient is a 68 y.o. female who presented with recalcitrant de Quervain's indicated for surgery.   DESCRIPTION OF PROCEDURE:  The patient was placed in the supine position. Prophylactic antibiotics were administered prior to incision.  Nonsterile tourniquet was placed on the upper arm.  The extremity was prepped and draped in standard sterile fashion.  A radially based incision over the radial styloid was used.  The superficial radial nerve branches were identified, mobilized and protected. Then the distal edge of the first dorsal extensor retinaculum was identified.  There was a significant amount of fluid within the first dorsal compartment.  Severe tenosynovitis of the EPB and APL tendons was encountered.  Tenolysis of the EPB and APL tendons was performed.  There were 4 slips of the APL tendon.  The EPB tendon was in a separate subcompartment.  In order to aid in the tenolysis the retinaculum over the APL and EPB were released over its dorsal edge. The extensor pollicis brevis tendon was identified. There was a separate thick septum in between the EPB and abductor pollicis longus tendons. The septum was excised. The abductor pollicis longus tendon slips were examined.  At this point, local infiltration with 0.25% of Sensorcaine  was  given. Tourniquet was released. Hemostasis achieved. Wound was irrigated. Incision closed using 4-0 nylon sutures. Sterile dressing applied and hand immobilized in a thumb spica splint. The patient was transferred to recovery room in stable condition after all counts were correct.   POSTOPERATIVE PLAN: To start thumb range of motion exercises and wear brace for two weeks, avoid heavy lifting, pushing, pinching for four weeks.

## 2024-04-26 NOTE — Discharge Instructions (Signed)
 Postoperative instructions:  Weightbearing instructions: non weight bearing  Dressing instructions: Keep your dressing and/or splint clean and dry at all times.  It will be removed at your first post-operative appointment.  Your stitches and/or staples will be removed at this visit.  Incision instructions:  Do not soak your incision for 3 weeks after surgery.  If the incision gets wet, pat dry and do not scrub the incision.  Pain control:  You have been given a prescription to be taken as directed for post-operative pain control.  In addition, elevate the operative extremity above the heart at all times to prevent swelling and throbbing pain.  Take over-the-counter Colace, 100mg  by mouth twice a day while taking narcotic pain medications to help prevent constipation.  Follow up appointments: 1) 7 days for suture removal and wound check. 2) Dr. Jerri as scheduled.   -------------------------------------------------------------------------------------------------------------  After Surgery Pain Control:  After your surgery, post-surgical discomfort or pain is likely. This discomfort can last several days to a few weeks. At certain times of the day your discomfort may be more intense.  Did you receive a nerve block?  A nerve block can provide pain relief for one hour to two days after your surgery. As long as the nerve block is working, you will experience little or no sensation in the area the surgeon operated on.  As the nerve block wears off, you will begin to experience pain or discomfort. It is very important that you begin taking your prescribed pain medication before the nerve block fully wears off. Treating your pain at the first sign of the block wearing off will ensure your pain is better controlled and more tolerable when full-sensation returns. Do not wait until the pain is intolerable, as the medicine will be less effective. It is better to treat pain in advance than to try and  catch up.  General Anesthesia:  If you did not receive a nerve block during your surgery, you will need to start taking your pain medication shortly after your surgery and should continue to do so as prescribed by your surgeon.  Pain Medication:  Most commonly we prescribe Vicodin and Percocet for post-operative pain. Both of these medications contain a combination of acetaminophen  (Tylenol ) and a narcotic to help control pain.   It takes between 30 and 45 minutes before pain medication starts to work. It is important to take your medication before your pain level gets too intense.   Nausea is a common side effect of many pain medications. You will want to eat something before taking your pain medicine to help prevent nausea.   If you are taking a prescription pain medication that contains acetaminophen , we recommend that you do not take additional over the counter acetaminophen  (Tylenol ).  Other pain relieving options:   Using a cold pack to ice the affected area a few times a day (15 to 20 minutes at a time) can help to relieve pain, reduce swelling and bruising.   Elevation of the affected area can also help to reduce pain and swelling.  Per [facility] clinic policy, our goal is ensure optimal postoperative pain control with a multimodal pain management strategy. For all [facility] patients, our goal is to wean post-operative narcotic medications by 6 weeks post-operatively. If this is not possible due to utilization of pain medication prior to surgery, your [facility] doctor will support your acute post-operative pain control for the first 6 weeks postoperatively, with a plan to transition you back to your  primary pain team following that. Maralee will work to ensure a Therapist, occupational.

## 2024-04-29 ENCOUNTER — Encounter (HOSPITAL_COMMUNITY): Payer: Self-pay | Admitting: Orthopaedic Surgery

## 2024-04-29 NOTE — Anesthesia Postprocedure Evaluation (Signed)
 Anesthesia Post Note  Patient: Melinda Mckinney  Procedure(s) Performed: LEFT WRIST, FIRST DORSAL COMPARTMENT RELEASE (Left: Wrist)     Patient location during evaluation: PACU Anesthesia Type: General Level of consciousness: sedated and patient cooperative Pain management: pain level controlled Vital Signs Assessment: post-procedure vital signs reviewed and stable Respiratory status: spontaneous breathing Cardiovascular status: stable Anesthetic complications: no Comments: AICD reactivated and pt returned to normal pacer function in PACU by medtronic rep   There were no known notable events for this encounter.  Last Vitals:  Vitals:   04/26/24 0900 04/26/24 0915  BP: (!) 104/58 106/61  Pulse: 60 60  Resp: 15 14  Temp:  36.7 C  SpO2: 97% 99%    Last Pain:  Vitals:   04/26/24 0822  TempSrc:   PainSc: 10-Worst pain ever                 Norleen Pope

## 2024-05-03 ENCOUNTER — Other Ambulatory Visit (HOSPITAL_COMMUNITY): Payer: Self-pay

## 2024-05-03 MED ORDER — SPIRONOLACTONE 25 MG PO TABS: 12.5000 mg | ORAL_TABLET | Freq: Every evening | ORAL | 3 refills | Status: AC

## 2024-05-03 NOTE — Progress Notes (Signed)
 ICM remote transmission rescheduled for 05/13/2024

## 2024-05-06 ENCOUNTER — Encounter

## 2024-05-08 NOTE — Progress Notes (Signed)
 Remote PPM Transmission

## 2024-05-10 ENCOUNTER — Ambulatory Visit: Admitting: Physician Assistant

## 2024-05-10 ENCOUNTER — Encounter: Payer: Self-pay | Admitting: Physician Assistant

## 2024-05-10 DIAGNOSIS — M654 Radial styloid tenosynovitis [de Quervain]: Secondary | ICD-10-CM

## 2024-05-10 NOTE — Progress Notes (Signed)
 Post-Op Visit Note   Patient: Melinda Mckinney           Date of Birth: 09-03-1955           MRN: [id number] Visit Date: 05/10/2024 PCP: System, Provider Not In   Assessment & Plan:  Chief Complaint:  Chief Complaint  Patient presents with   Left Wrist - Follow-up    Left wrist first dorsal compartment release 04/26/2024   Visit Diagnoses:  1. De Quervain's disease (tenosynovitis)     Plan: Patient is a pleasant 68 year old female who comes in today 2 weeks status post left de Quervain's release, date of surgery 04/26/2024.  She has been doing okay.  She is in some pain which is relieved with Tylenol .  She has been compliant wearing her thumb spica splint.  Examination of the left wrist reveals a well-healed surgical incision with nylon sutures in place.  No evidence of infection or cellulitis.  Fingers warm well-perfused.  She is neurovascularly intact distally.  Today, sutures were removed and Steri-Strips applied.  She will begin range of motion exercises of the thumb.  No pushing, pulling, pinching or heavy lifting for another 2 weeks.  Follow-up in 2 weeks for recheck.  Call with concerns or questions.  Follow-Up Instructions: Return in about 4 weeks (around 06/07/2024).   Orders:  No orders of the defined types were placed in this encounter.  No orders of the defined types were placed in this encounter.   Imaging: No results found.  PMFS History: Patient Active Problem List   Diagnosis Date Noted   Radial styloid tenosynovitis 04/26/2024   Complete heart block (HCC) 02/05/2020   Pacemaker 02/05/2020   Permanent atrial fibrillation (HCC) 02/05/2020   Non-ischemic cardiomyopathy (HCC) 07/10/2019   S/P ICD (internal cardiac defibrillator) procedure    Pneumonia due to COVID-19 virus 05/25/2019   Encounter for monitoring sotalol  therapy 08/03/2017   Acute systolic (congestive) heart failure (HCC) 07/27/2017   Atrial fibrillation with RVR (HCC) 06/29/2017   PCP NOTES >>>>  06/16/2015   Back pain 02/05/2015   Hypothyroidism 01/19/2015   Vitamin D  deficiency    Vulvar atrophy 08/20/2014   Annual physical exam 07/24/2014   Anemia 07/24/2014   DM2 (diabetes mellitus, type 2) (HCC) 09/27/2013   Insomnia 09/27/2013   Intertrigo 09/27/2013   Urolithiasis 09/27/2013   Essential hypertension    GERD, h/o achalasia s/p heller ~2009)    Hyperlipidemia LDL goal <70    CAD- H/O prior PCI    Osteoporosis    Fibromyalgia    Past Medical History:  Diagnosis Date   Anemia    Anxiety and depression    Asthma    Borderline diabetes    CAD (coronary artery disease) ~2009   stent    Cervical cancer (HCC)    stage 1; had cryotherapy   CHF (congestive heart failure) (HCC)    Chronic lower back pain    Complete heart block (HCC) 06/2019   s/p AV nodal ablation and CRT-P by Dr Waddell   DDD (degenerative disc disease), lumbar    Dr. Bonner, recommends lumbar epidural steroid injections L5-S1 to the right   Depression    Fibromyalgia    GERD (gastroesophageal reflux disease)    High cholesterol    History of hiatal hernia    History of kidney stones    Hypertension    Hypothyroidism    Osteoporosis    Permanent atrial fibrillation (HCC) 07/2017   s/p AV nodal ablation  Presence of permanent cardiac pacemaker    Thyroid  disease     Family History  Problem Relation Age of Onset   Breast cancer Other        aunt    CAD Brother    Lung cancer Mother        F and M   Diabetes Father        F and mother    CAD Father    Lung cancer Father    Stroke Sister    Colon cancer Neg Hx     Past Surgical History:  Procedure Laterality Date   AV NODE ABLATION N/A 06/24/2019   Procedure: AV NODE ABLATION;  Surgeon: Waddell Danelle ORN, MD;  [facility]: [facility] INVASIVE CV LAB;  Service: Cardiovascular;  Laterality: N/A;   BIV PACEMAKER INSERTION CRT-P N/A 06/24/2019   Procedure: BIV PACEMAKER INSERTION CRT-P;  Surgeon: Waddell Danelle ORN, MD;  Location: [facility] INVASIVE CV LAB;   Service: Cardiovascular;  Laterality: N/A;   CARDIAC CATHETERIZATION  ~ 2008   CARDIOVERSION N/A 08/05/2017   Procedure: CARDIOVERSION;  Surgeon: Waddell Danelle ORN, MD;  Location: [facility] OR;  Service: Cardiovascular;  Laterality: N/A;   CARDIOVERSION N/A 08/24/2017   Procedure: CARDIOVERSION;  Surgeon: Francyne Headland, MD;  Location: [facility] ENDOSCOPY;  Service: Cardiovascular;  Laterality: N/A;   CARDIOVERSION N/A 06/19/2019   Procedure: CARDIOVERSION;  Surgeon: Rolan Ezra RAMAN, MD;  Location: [facility] OR;  Service: Cardiovascular;  Laterality: N/A;   CARDIOVERSION N/A 06/21/2019   Procedure: CARDIOVERSION;  Surgeon: Rolan Ezra RAMAN, MD;  Location: [facility] ENDOSCOPY;  Service: Cardiovascular;  Laterality: N/A;   CESAREAN SECTION  1975; ~ 1978/1979; 1984   ESOPHAGOMYOTOMY  2009   GYNECOLOGIC CRYOSURGERY  X 2   stage 1 cancer   HERNIA REPAIR  X 6   all in my stomach (07/27/2017)   KNEE ARTHROSCOPY Right    NEPHRECTOMY Right 1991   in [place], due to fibrosis and lithiasis   REPAIR EXTENSOR TENDON Left 04/26/2024   Procedure: LEFT WRIST, FIRST DORSAL COMPARTMENT RELEASE;  Surgeon: Jerri Kay HERO, MD;  Location: [facility] OR;  Service: Orthopedics;  Laterality: Left;   RIGHT/LEFT HEART CATH AND CORONARY ANGIOGRAPHY N/A 06/17/2019   Procedure: RIGHT/LEFT HEART CATH AND CORONARY ANGIOGRAPHY;  Surgeon: Court Dorn PARAS, MD;  Location: [facility] INVASIVE CV LAB;  Service: Cardiovascular;  Laterality: N/A;   TEE WITHOUT CARDIOVERSION N/A 06/19/2019   Procedure: TRANSESOPHAGEAL ECHOCARDIOGRAM (TEE);  Surgeon: Rolan Ezra RAMAN, MD;  Location: [facility] OR;  Service: Cardiovascular;  Laterality: N/A;   TUBAL LIGATION     Social History   Occupational History   Occupation: stay home   Tobacco Use   Smoking status: Former    Current packs/day: 0.00    Average packs/day: 2.0 packs/day for 28.0 years (56.0 ttl pk-yrs)    Types: Cigarettes    Start date: 71    Quit date: 2000    Years since quitting: 25.7   Smokeless tobacco:  Never  Vaping Use   Vaping status: Never Used  Substance and Sexual Activity   Alcohol use: Yes    Alcohol/week: 5.0 standard drinks of alcohol    Types: 5 Cans of beer per week    Comment: 07/27/2017 3-4 drinks//month   Drug use: No   Sexual activity: Not Currently    Comment: 1ST INTERCOURSE- 16, PARTNERS - 2

## 2024-05-13 ENCOUNTER — Encounter: Attending: Internal Medicine

## 2024-05-13 DIAGNOSIS — I5022 Chronic systolic (congestive) heart failure: Secondary | ICD-10-CM | POA: Insufficient documentation

## 2024-05-13 DIAGNOSIS — Z950 Presence of cardiac pacemaker: Secondary | ICD-10-CM | POA: Diagnosis not present

## 2024-05-16 NOTE — Progress Notes (Signed)
 EPIC Encounter for ICM Monitoring  Patient Name: Melinda Mckinney is a 68 y.o. female Date: 05/16/2024 Primary Care Physican: System, Provider Not In Primary Cardiologist: Rolan Electrophysiologist: Mealor Bi-V Pacing: 100%         08/08/2023 Weight: 220 lbs 08/29/2023 Weight: Unknown, has not weighed in the last couple of weeks 09/12/2023 Weight: 219 lbs  12/28/2023 Office Weight: 227 lbs 01/23/2024 Weight: 218 lbs 02/19/2024 Weight: 206 lbs (unsure of exact weight) 03/04/2024 Office Weight: 223 lbs 05/16/2024 Weight: 217 lbs   Time in AT/AF  24.0 hr/day (100.0%)            Spoke with husband and heart failure questions reviewed.  Transmission results reviewed.  Pt asymptomatic for fluid accumulation.  Reports feeling well at this time and voices no complaints.     Diet:  Does not follow low salt diet.  Typically eats restaurant breakfast every morning.   Optivol thoracic impedance suggesting intermittent days with possible fluid accumulation within the last month.     Prescribed:  Torsemide  20 mg take 4 tablets (80 mg total) by mouth every morning and 3 tablets (60 mg total) every evening (increased 6/9).  Potassium 20 mEq take 1 tablet daily  Spironolactone  25 mg take 0.5 tablet (12.5 mg total) by mouth at bedtime   Labs: 04/26/2024 Creatinine 1.83, BUN 24, Potassium 4.0, Sodium 139, GFR 29 02/08/2024 Creatinine 1.73, BUN 33, Potassium 4.6, Sodium 139, GFR 31 11/23/2023 Creatinine 1.7,   BUN 44, Potassium 4.7, Sodium 139, GFR 32 09/29/2023 Creatinine 1.99, BUN 32, Potassium 5.0, Sodium 140, GFR 27 A complete set of results can be found in Results Review.   Recommendations:  No changes and encouraged to call if experiencing any fluid symptoms.    Follow-up plan: ICM clinic phone appointment on 06/24/2024.   91 day device clinic remote transmission 07/01/2024.     EP/Cardiology Office Visits:  Advised to call Dr Orvilla office for appointment.  Recall 06/02/2024 with Dr Rolan.  Recall  12/22/2024 with Dr Nancey.       Copy of ICM check sent to Dr. Nancey.    Remote monitoring is medically necessary for Heart Failure Management.    90 day Daily Thoracic Impedance ICM trend: 02/12/2024 through 05/13/2024.    12-14 Month Thoracic Impedance ICM trend:     [name], RN 05/16/2024 8:33 AM

## 2024-05-29 ENCOUNTER — Other Ambulatory Visit (HOSPITAL_COMMUNITY): Payer: Self-pay | Admitting: Cardiology

## 2024-06-07 ENCOUNTER — Ambulatory Visit: Admitting: Physician Assistant

## 2024-06-07 DIAGNOSIS — M654 Radial styloid tenosynovitis [de Quervain]: Secondary | ICD-10-CM

## 2024-06-07 NOTE — Progress Notes (Signed)
 Post-Op Visit Note   Patient: Melinda Mckinney           Date of Birth: [birth year]           MRN: [id number] Visit Date: 06/07/2024 PCP: System, Provider Not In   Assessment & Plan:  Chief Complaint:  Chief Complaint  Patient presents with   Left Wrist - Follow-up    DeQuervains Release 04/26/2024   Visit Diagnoses:  1. Tenosynovitis, de Quervain     Plan: Patient is a very pleasant 68 year old female who comes in today 4 weeks status post left de Quervain's release.  She has been doing well.  Occasional discomfort at times.  Examination of her left wrist reveals a fully healed surgical scar without complication.  Painless range of motion.  She is neurovascularly intact distally.  At this point, she may advance with activity as tolerated.  Follow-up as needed.  Call with concerns or questions.  Follow-Up Instructions: Return if symptoms worsen or fail to improve.   Orders:  No orders of the defined types were placed in this encounter.  No orders of the defined types were placed in this encounter.   Imaging: No new imaging  PMFS History: Patient Active Problem List   Diagnosis Date Noted   Radial styloid tenosynovitis 04/26/2024   Complete heart block (HCC) 02/05/2020   Pacemaker 02/05/2020   Permanent atrial fibrillation (HCC) 02/05/2020   Non-ischemic cardiomyopathy (HCC) 07/10/2019   S/P ICD (internal cardiac defibrillator) procedure    Pneumonia due to COVID-19 virus 05/25/2019   Encounter for monitoring sotalol  therapy 08/03/2017   Acute systolic (congestive) heart failure (HCC) 07/27/2017   Atrial fibrillation with RVR (HCC) 06/29/2017   PCP NOTES >>>> 06/16/2015   Back pain 02/05/2015   Hypothyroidism 01/19/2015   Vitamin D  deficiency    Vulvar atrophy 08/20/2014   Annual physical exam 07/24/2014   Anemia 07/24/2014   DM2 (diabetes mellitus, type 2) (HCC) 09/27/2013   Insomnia 09/27/2013   Intertrigo 09/27/2013   Urolithiasis 09/27/2013   Essential  hypertension    GERD, h/o achalasia s/p heller ~2009)    Hyperlipidemia LDL goal <70    CAD- H/O prior PCI    Osteoporosis    Fibromyalgia    Past Medical History:  Diagnosis Date   Anemia    Anxiety and depression    Asthma    Borderline diabetes    CAD (coronary artery disease) ~2009   stent    Cervical cancer (HCC)    stage 1; had cryotherapy   CHF (congestive heart failure) (HCC)    Chronic lower back pain    Complete heart block (HCC) 06/2019   s/p AV nodal ablation and CRT-P by Dr Waddell   DDD (degenerative disc disease), lumbar    Dr. Bonner, recommends lumbar epidural steroid injections L5-S1 to the right   Depression    Fibromyalgia    GERD (gastroesophageal reflux disease)    High cholesterol    History of hiatal hernia    History of kidney stones    Hypertension    Hypothyroidism    Osteoporosis    Permanent atrial fibrillation (HCC) 07/2017   s/p AV nodal ablation   Presence of permanent cardiac pacemaker    Thyroid  disease     Family History  Problem Relation Age of Onset   Breast cancer Other        aunt    CAD Brother    Lung cancer Mother  F and M   Diabetes Father        F and mother    CAD Father    Lung cancer Father    Stroke Sister    Colon cancer Neg Hx     Past Surgical History:  Procedure Laterality Date   AV NODE ABLATION N/A 06/24/2019   Procedure: AV NODE ABLATION;  Surgeon: Waddell Danelle ORN, MD;  Location: [facility] INVASIVE CV LAB;  Service: Cardiovascular;  Laterality: N/A;   BIV PACEMAKER INSERTION CRT-P N/A 06/24/2019   Procedure: BIV PACEMAKER INSERTION CRT-P;  Surgeon: Waddell Danelle ORN, MD;  Location: [facility] INVASIVE CV LAB;  Service: Cardiovascular;  Laterality: N/A;   CARDIAC CATHETERIZATION  ~ 2008   CARDIOVERSION N/A 08/05/2017   Procedure: CARDIOVERSION;  Surgeon: Waddell Danelle ORN, MD;  Location: [facility] OR;  Service: Cardiovascular;  Laterality: N/A;   CARDIOVERSION N/A 08/24/2017   Procedure: CARDIOVERSION;  Surgeon: Francyne Headland, MD;  Location: [facility] ENDOSCOPY;  Service: Cardiovascular;  Laterality: N/A;   CARDIOVERSION N/A 06/19/2019   Procedure: CARDIOVERSION;  Surgeon: Rolan Ezra RAMAN, MD;  Location: [facility] OR;  Service: Cardiovascular;  Laterality: N/A;   CARDIOVERSION N/A 06/21/2019   Procedure: CARDIOVERSION;  Surgeon: Rolan Ezra RAMAN, MD;  Location: [facility] ENDOSCOPY;  Service: Cardiovascular;  Laterality: N/A;   CESAREAN SECTION  1975; ~ 1978/1979; 1984   ESOPHAGOMYOTOMY  2009   GYNECOLOGIC CRYOSURGERY  X 2   stage 1 cancer   HERNIA REPAIR  X 6   all in my stomach (07/27/2017)   KNEE ARTHROSCOPY Right    NEPHRECTOMY Right 1991   in [place], due to fibrosis and lithiasis   REPAIR EXTENSOR TENDON Left 04/26/2024   Procedure: LEFT WRIST, FIRST DORSAL COMPARTMENT RELEASE;  Surgeon: Jerri Kay HERO, MD;  Location: [facility] OR;  Service: Orthopedics;  Laterality: Left;   RIGHT/LEFT HEART CATH AND CORONARY ANGIOGRAPHY N/A 06/17/2019   Procedure: RIGHT/LEFT HEART CATH AND CORONARY ANGIOGRAPHY;  Surgeon: Court Dorn PARAS, MD;  Location: [facility] INVASIVE CV LAB;  Service: Cardiovascular;  Laterality: N/A;   TEE WITHOUT CARDIOVERSION N/A 06/19/2019   Procedure: TRANSESOPHAGEAL ECHOCARDIOGRAM (TEE);  Surgeon: Rolan Ezra RAMAN, MD;  Location: [facility] OR;  Service: Cardiovascular;  Laterality: N/A;   TUBAL LIGATION     Social History   Occupational History   Occupation: stay home   Tobacco Use   Smoking status: Former    Current packs/day: 0.00    Average packs/day: 2.0 packs/day for 28.0 years (56.0 ttl pk-yrs)    Types: Cigarettes    Start date: 48    Quit date: 2000    Years since quitting: 25.8   Smokeless tobacco: Never  Vaping Use   Vaping status: Never Used  Substance and Sexual Activity   Alcohol use: Yes    Alcohol/week: 5.0 standard drinks of alcohol    Types: 5 Cans of beer per week    Comment: 07/27/2017 3-4 drinks//month   Drug use: No   Sexual activity: Not Currently    Comment: 1ST INTERCOURSE- 16,  PARTNERS - 2

## 2024-06-17 ENCOUNTER — Encounter: Payer: Self-pay | Admitting: Radiology

## 2024-06-24 ENCOUNTER — Ambulatory Visit: Attending: Cardiovascular Disease

## 2024-06-24 DIAGNOSIS — Z950 Presence of cardiac pacemaker: Secondary | ICD-10-CM | POA: Diagnosis not present

## 2024-06-24 DIAGNOSIS — I5022 Chronic systolic (congestive) heart failure: Secondary | ICD-10-CM | POA: Diagnosis not present

## 2024-06-28 NOTE — Progress Notes (Signed)
 EPIC Encounter for ICM Monitoring  Patient Name: Melinda Mckinney is a 68 y.o. female Date: 06/28/2024 Primary Care Physican: System, Provider Not In Primary Cardiologist: Rolan Electrophysiologist: Mealor Bi-V Pacing: 99.8%         08/08/2023 Weight: 220 lbs 08/29/2023 Weight: Unknown, has not weighed in the last couple of weeks 09/12/2023 Weight: 219 lbs  12/28/2023 Office Weight: 227 lbs 01/23/2024 Weight: 218 lbs 02/19/2024 Weight: 206 lbs (unsure of exact weight) 03/04/2024 Office Weight: 223 lbs 05/16/2024 Weight: 217 lbs   Time in AT/AF  24.0 hr/day (100.0%)            Spoke with husband and heart failure questions reviewed.  Transmission results reviewed.  Pt asymptomatic for fluid accumulation.  Reports feeling well at this time and voices no complaints.     Diet:  Does not follow low salt diet.  Typically eats restaurant breakfast every morning.   Since 05/13/2024 ICM Remote Transmission: Optivol thoracic impedance suggesting normal fluid levels with the exception of possible fluid accumulation from 05/20/2024-06/06/2024.   Prescribed:  Torsemide  20 mg take 4 tablets (80 mg total) by mouth every morning and 3 tablets (60 mg total) every evening (increased 6/9).  Potassium 20 mEq take 1 tablet daily  Spironolactone  25 mg take 0.5 tablet (12.5 mg total) by mouth at bedtime   Labs: 04/26/2024 Creatinine 1.83, BUN 24, Potassium 4.0, Sodium 139, GFR 29 02/08/2024 Creatinine 1.73, BUN 33, Potassium 4.6, Sodium 139, GFR 31 11/23/2023 Creatinine 1.7,   BUN 44, Potassium 4.7, Sodium 139, GFR 32 09/29/2023 Creatinine 1.99, BUN 32, Potassium 5.0, Sodium 140, GFR 27 A complete set of results can be found in Results Review.   Recommendations:  No changes and encouraged to call if experiencing any fluid symptoms.   Follow-up plan: ICM clinic phone appointment on 07/29/2024.   91 day device clinic remote transmission 09/30/2024.     EP/Cardiology Office Visits:  Advised to call Dr Orvilla  office for appointment.  Recall 06/02/2024 with Dr Rolan.  Recall 12/22/2024 with Dr Nancey.       Copy of ICM check sent to Dr. Nancey.      Remote monitoring is medically necessary for Heart Failure Management.    Daily Thoracic Impedance ICM trend: 03/25/2024 through 06/24/2024.    12-14 Month Thoracic Impedance ICM trend:     [name], RN 06/28/2024 10:18 AM

## 2024-07-01 ENCOUNTER — Ambulatory Visit: Payer: Medicare Other

## 2024-07-01 DIAGNOSIS — I5022 Chronic systolic (congestive) heart failure: Secondary | ICD-10-CM

## 2024-07-01 LAB — CUP PACEART REMOTE DEVICE CHECK
Battery Remaining Longevity: 25 mo
Battery Voltage: 2.91 V
Brady Statistic RA Percent Paced: 0 %
Brady Statistic RV Percent Paced: 99.57 %
Date Time Interrogation Session: 20251116231257
Implantable Lead Connection Status: 753985
Implantable Lead Connection Status: 753985
Implantable Lead Connection Status: 753985
Implantable Lead Implant Date: 20201109
Implantable Lead Implant Date: 20201109
Implantable Lead Implant Date: 20201109
Implantable Lead Location: 753858
Implantable Lead Location: 753859
Implantable Lead Location: 753860
Implantable Lead Model: 4598
Implantable Lead Model: 5076
Implantable Lead Model: 5076
Implantable Pulse Generator Implant Date: 20201109
Lead Channel Impedance Value: 1102 Ohm
Lead Channel Impedance Value: 1140 Ohm
Lead Channel Impedance Value: 1330 Ohm
Lead Channel Impedance Value: 1463 Ohm
Lead Channel Impedance Value: 1520 Ohm
Lead Channel Impedance Value: 418 Ohm
Lead Channel Impedance Value: 456 Ohm
Lead Channel Impedance Value: 475 Ohm
Lead Channel Impedance Value: 551 Ohm
Lead Channel Impedance Value: 608 Ohm
Lead Channel Impedance Value: 722 Ohm
Lead Channel Impedance Value: 779 Ohm
Lead Channel Impedance Value: 912 Ohm
Lead Channel Impedance Value: 969 Ohm
Lead Channel Pacing Threshold Amplitude: 0.5 V
Lead Channel Pacing Threshold Amplitude: 1.375 V
Lead Channel Pacing Threshold Pulse Width: 0.4 ms
Lead Channel Pacing Threshold Pulse Width: 0.8 ms
Lead Channel Sensing Intrinsic Amplitude: 1.625 mV
Lead Channel Sensing Intrinsic Amplitude: 1.625 mV
Lead Channel Sensing Intrinsic Amplitude: 17.875 mV
Lead Channel Sensing Intrinsic Amplitude: 17.875 mV
Lead Channel Setting Pacing Amplitude: 2 V
Lead Channel Setting Pacing Amplitude: 2.5 V
Lead Channel Setting Pacing Pulse Width: 0.4 ms
Lead Channel Setting Pacing Pulse Width: 0.8 ms
Lead Channel Setting Sensing Sensitivity: 2 mV
Zone Setting Status: 755011
Zone Setting Status: 755011

## 2024-07-03 NOTE — Progress Notes (Signed)
 Remote PPM Transmission

## 2024-07-15 ENCOUNTER — Ambulatory Visit: Payer: Self-pay | Admitting: Cardiovascular Disease

## 2024-07-29 ENCOUNTER — Ambulatory Visit: Attending: Cardiovascular Disease

## 2024-07-29 DIAGNOSIS — I5022 Chronic systolic (congestive) heart failure: Secondary | ICD-10-CM | POA: Diagnosis not present

## 2024-07-29 DIAGNOSIS — Z950 Presence of cardiac pacemaker: Secondary | ICD-10-CM | POA: Diagnosis not present

## 2024-08-02 NOTE — Progress Notes (Signed)
 EPIC Encounter for ICM Monitoring  Patient Name: Melinda Mckinney is a 68 y.o. female Date: 08/02/2024 Primary Care Physican: System, Provider Not In Primary Cardiologist: Melinda Mckinney Electrophysiologist: Melinda Mckinney Bi-V Pacing: 99.7%         08/08/2023 Weight: 220 lbs 08/29/2023 Weight: Unknown, has not weighed in the last couple of weeks 09/12/2023 Weight: 219 lbs  12/28/2023 Office Weight: 227 lbs 01/23/2024 Weight: 218 lbs 02/19/2024 Weight: 206 lbs (unsure of exact weight) 03/04/2024 Office Weight: 223 lbs 05/16/2024 Weight: 217 lbs   Time in AT/AF  24.0 hr/day (100.0%)            Transmission results reviewed.      Diet:  Does not follow low salt diet.  Typically eats restaurant breakfast every morning.   Since 06/24/2024 ICM Remote Transmission: Optivol thoracic impedance suggesting normal fluid levels.   Prescribed:  Torsemide  20 mg take 4 tablets (80 mg total) by mouth every morning and 3 tablets (60 mg total) every evening.  Potassium 20 mEq take 1 tablet daily  Spironolactone  25 mg take 0.5 tablet (12.5 mg total) by mouth at bedtime   Labs: 07/18/2024 Creatinine 1.58, BUN 29, Potassium 4.6, Sodium 141, GFR 35 04/26/2024 Creatinine 1.83, BUN 24, Potassium 4.0, Sodium 139, GFR 29 02/08/2024 Creatinine 1.73, BUN 33, Potassium 4.6, Sodium 139, GFR 31 11/23/2023 Creatinine 1.7,   BUN 44, Potassium 4.7, Sodium 139, GFR 32 09/29/2023 Creatinine 1.99, BUN 32, Potassium 5.0, Sodium 140, GFR 27 A complete set of results can be found in Results Review.   Recommendations:  No changes.    Follow-up plan: ICM clinic phone appointment on 09/02/2024.   91 day device clinic remote transmission 09/30/2024.     EP/Cardiology Office Visits: Aware to call Dr Melinda Mckinney office for appointment.  Recall 06/02/2024 with Dr Melinda Mckinney.  Recall 12/22/2024 with Dr Melinda Mckinney.       Copy of ICM check sent to Dr. Nancey.      Remote monitoring is medically necessary for Heart Failure Management.    Daily Thoracic  Impedance ICM trend: 04/29/2024 through 07/29/2024.    12-14 Month Thoracic Impedance ICM trend:     Melinda GORMAN Garner, RN 08/02/2024 8:22 AM

## 2024-08-28 ENCOUNTER — Other Ambulatory Visit: Payer: Self-pay | Admitting: *Deleted

## 2024-08-28 DIAGNOSIS — I4891 Unspecified atrial fibrillation: Secondary | ICD-10-CM

## 2024-08-28 MED ORDER — RIVAROXABAN 15 MG PO TABS: 15.0000 mg | ORAL_TABLET | Freq: Every day | ORAL | 1 refills | Status: AC

## 2024-08-28 NOTE — Telephone Encounter (Signed)
 Xarelto  15mg  refill request received. Pt is 69 years old, weight-98.9kg, Crea-1.83 on 04/26/24, last seen by Greig Mosses, NP on 03/04/24, Diagnosis-Afib, CrCl- 44.66 mL/min; Dose is appropriate based on dosing criteria. Will send in refill to requested pharmacy.

## 2024-09-02 ENCOUNTER — Ambulatory Visit

## 2024-09-02 DIAGNOSIS — I5022 Chronic systolic (congestive) heart failure: Secondary | ICD-10-CM

## 2024-09-02 DIAGNOSIS — Z950 Presence of cardiac pacemaker: Secondary | ICD-10-CM

## 2024-09-05 NOTE — Progress Notes (Signed)
 EPIC Encounter for ICM Monitoring  Patient Name: Melinda Mckinney is a 69 y.o. female Date: 09/05/2024 Primary Care Physican: System, Provider Not In Primary Cardiologist: Rolan Electrophysiologist: Mealor Bi-V Pacing: 99.9%         08/08/2023 Weight: 220 lbs 08/29/2023 Weight: Unknown, has not weighed in the last couple of weeks 09/12/2023 Weight: 219 lbs  12/28/2023 Office Weight: 227 lbs 01/23/2024 Weight: 218 lbs 02/19/2024 Weight: 206 lbs (unsure of exact weight) 03/04/2024 Office Weight: 223 lbs 05/16/2024 Weight: 217 lbs   Time in AT/AF  24.0 hr/day (100.0%)            Spoke with husband and heart failure questions reviewed.  Transmission results reviewed.  Pt asymptomatic for fluid accumulation.  Reports feeling well at this time and voices no complaints.     Diet:  Does not follow low salt diet.  Typically eats restaurant breakfast every morning.   Since 07/29/2024 ICM Remote Transmission: Optivol thoracic impedance suggesting normal fluid levels with the exception of possible fluid accumulation from 08/19/2024-08/26/2024.   Prescribed:  Torsemide  20 mg take 4 tablets (80 mg total) by mouth every morning and 3 tablets (60 mg total) every evening.  Potassium 20 mEq take 1 tablet daily  Spironolactone  25 mg take 0.5 tablet (12.5 mg total) by mouth at bedtime   Labs: 07/18/2024 Creatinine 1.58, BUN 29, Potassium 4.6, Sodium 141, GFR 35 04/26/2024 Creatinine 1.83, BUN 24, Potassium 4.0, Sodium 139, GFR 29 02/08/2024 Creatinine 1.73, BUN 33, Potassium 4.6, Sodium 139, GFR 31 11/23/2023 Creatinine 1.7,   BUN 44, Potassium 4.7, Sodium 139, GFR 32 09/29/2023 Creatinine 1.99, BUN 32, Potassium 5.0, Sodium 140, GFR 27 A complete set of results can be found in Results Review.   Recommendations:  No changes and encouraged to call if experiencing any fluid symptoms.   Follow-up plan: ICM clinic phone appointment on 10/07/2024.   91 day device clinic remote transmission 09/30/2024.      EP/Cardiology Office Visits:  Advised to call Dr Orvilla office for appointment.  Recall 06/02/2024 with Dr Rolan.  Recall 12/22/2024 with Dr Nancey.       Copy of ICM check sent to Dr. Nancey.      Remote monitoring is medically necessary for Heart Failure Management.    Daily Thoracic Impedance ICM trend: 06/03/2024 through 09/02/2024.    12-14 Month Thoracic Impedance ICM trend:     Melinda GORMAN Garner, RN 09/05/2024 11:00 AM

## 2024-09-12 ENCOUNTER — Encounter: Payer: Self-pay | Admitting: *Deleted

## 2024-09-12 NOTE — Progress Notes (Signed)
 31 day ICM Remote transmission canceled due to [facility] clinic is on hold until further notice.  91 day remote monitoring will continue per protocol.

## 2024-09-12 NOTE — Progress Notes (Signed)
 Melinda Mckinney                                          MRN: [id number]   09/12/2024   The [facility] Quality Team Specialist reviewed this patient medical record for the purposes of chart review for care gap closure. The following were reviewed: chart review for care gap closure-glycemic status assessment.    [facility] Quality Team

## 2024-09-30 ENCOUNTER — Ambulatory Visit: Payer: Medicare Other

## 2024-10-07 ENCOUNTER — Ambulatory Visit

## 2024-10-15 ENCOUNTER — Ambulatory Visit: Admitting: Internal Medicine

## 2024-11-18 ENCOUNTER — Ambulatory Visit (HOSPITAL_COMMUNITY): Admitting: Cardiology

## 2024-12-30 ENCOUNTER — Ambulatory Visit: Payer: Medicare Other

## 2025-03-31 ENCOUNTER — Ambulatory Visit: Payer: Medicare Other

## 2025-06-30 ENCOUNTER — Ambulatory Visit: Payer: Medicare Other
